# Patient Record
Sex: Male | Born: 1945
Health system: Southern US, Community
[De-identification: ages and names within clinical notes are randomized; demographics above are authoritative.]

## PROBLEM LIST (undated history)

## (undated) DIAGNOSIS — E785 Hyperlipidemia, unspecified: Secondary | ICD-10-CM

## (undated) DIAGNOSIS — I4892 Unspecified atrial flutter: Secondary | ICD-10-CM

## (undated) DIAGNOSIS — I10 Essential (primary) hypertension: Secondary | ICD-10-CM

## (undated) DIAGNOSIS — D751 Secondary polycythemia: Secondary | ICD-10-CM

## (undated) DIAGNOSIS — I639 Cerebral infarction, unspecified: Secondary | ICD-10-CM

## (undated) DIAGNOSIS — Z8619 Personal history of other infectious and parasitic diseases: Secondary | ICD-10-CM

## (undated) DIAGNOSIS — M5430 Sciatica, unspecified side: Secondary | ICD-10-CM

## (undated) DIAGNOSIS — K648 Other hemorrhoids: Secondary | ICD-10-CM

## (undated) DIAGNOSIS — K635 Polyp of colon: Secondary | ICD-10-CM

## (undated) DIAGNOSIS — I499 Cardiac arrhythmia, unspecified: Secondary | ICD-10-CM

## (undated) HISTORY — DX: Polyp of colon: K63.5

## (undated) HISTORY — DX: Hyperlipidemia, unspecified: E78.5

## (undated) HISTORY — DX: Other hemorrhoids: K64.8

## (undated) HISTORY — PX: OTHER SURGICAL HISTORY: SHX169

## (undated) HISTORY — DX: Personal history of other infectious and parasitic diseases: Z86.19

## (undated) HISTORY — DX: Unspecified atrial flutter: I48.92

## (undated) HISTORY — PX: PROSTATE BIOPSY: SHX241

## (undated) HISTORY — DX: Sciatica, unspecified side: M54.30

---

## 1998-06-01 ENCOUNTER — Emergency Department (HOSPITAL_COMMUNITY): Admission: EM | Admit: 1998-06-01 | Discharge: 1998-06-01 | Payer: Self-pay | Admitting: Emergency Medicine

## 2000-11-23 ENCOUNTER — Encounter: Payer: Self-pay | Admitting: Gastroenterology

## 2000-11-23 ENCOUNTER — Encounter: Admission: RE | Admit: 2000-11-23 | Discharge: 2000-11-23 | Payer: Self-pay | Admitting: Gastroenterology

## 2000-12-09 ENCOUNTER — Ambulatory Visit (HOSPITAL_BASED_OUTPATIENT_CLINIC_OR_DEPARTMENT_OTHER): Admission: RE | Admit: 2000-12-09 | Discharge: 2000-12-09 | Payer: Self-pay | Admitting: Gastroenterology

## 2006-07-28 ENCOUNTER — Encounter: Payer: Self-pay | Admitting: Gastroenterology

## 2006-10-22 ENCOUNTER — Emergency Department (HOSPITAL_COMMUNITY): Admission: EM | Admit: 2006-10-22 | Discharge: 2006-10-22 | Payer: Self-pay | Admitting: Emergency Medicine

## 2006-11-22 DIAGNOSIS — I639 Cerebral infarction, unspecified: Secondary | ICD-10-CM

## 2006-11-22 HISTORY — DX: Cerebral infarction, unspecified: I63.9

## 2007-11-22 ENCOUNTER — Inpatient Hospital Stay (HOSPITAL_COMMUNITY): Admission: EM | Admit: 2007-11-22 | Discharge: 2007-11-25 | Payer: Self-pay | Admitting: Emergency Medicine

## 2007-11-22 ENCOUNTER — Ambulatory Visit: Payer: Self-pay | Admitting: Cardiovascular Disease

## 2007-11-24 ENCOUNTER — Encounter (INDEPENDENT_AMBULATORY_CARE_PROVIDER_SITE_OTHER): Payer: Self-pay | Admitting: Neurology

## 2007-11-27 ENCOUNTER — Emergency Department (HOSPITAL_COMMUNITY): Admission: EM | Admit: 2007-11-27 | Discharge: 2007-11-28 | Payer: Self-pay | Admitting: Emergency Medicine

## 2007-11-29 ENCOUNTER — Encounter: Admission: RE | Admit: 2007-11-29 | Discharge: 2007-12-21 | Payer: Self-pay | Admitting: Neurology

## 2008-02-08 ENCOUNTER — Ambulatory Visit (HOSPITAL_BASED_OUTPATIENT_CLINIC_OR_DEPARTMENT_OTHER): Admission: RE | Admit: 2008-02-08 | Discharge: 2008-02-08 | Payer: Self-pay | Admitting: Orthopedic Surgery

## 2009-11-22 HISTORY — PX: COLONOSCOPY: SHX5424

## 2010-09-15 ENCOUNTER — Encounter: Payer: Self-pay | Admitting: Gastroenterology

## 2010-10-09 ENCOUNTER — Telehealth: Payer: Self-pay | Admitting: Gastroenterology

## 2010-10-09 ENCOUNTER — Encounter (INDEPENDENT_AMBULATORY_CARE_PROVIDER_SITE_OTHER): Payer: Self-pay | Admitting: *Deleted

## 2010-10-12 ENCOUNTER — Ambulatory Visit: Payer: Self-pay | Admitting: Gastroenterology

## 2010-10-19 ENCOUNTER — Telehealth (INDEPENDENT_AMBULATORY_CARE_PROVIDER_SITE_OTHER): Payer: Self-pay | Admitting: *Deleted

## 2010-10-26 ENCOUNTER — Ambulatory Visit: Payer: Self-pay | Admitting: Gastroenterology

## 2010-10-26 LAB — HM COLONOSCOPY

## 2010-12-22 NOTE — Procedures (Signed)
Summary: Colon/[facility]  Colon/[facility]   Imported By: [place] 10/26/2010 09:40:17  _____________________________________________________________________  External Attachment:    Type:   Image     Comment:   External Document

## 2010-12-22 NOTE — Progress Notes (Signed)
  Phone Note Other Incoming   Request: Send information Summary of Call: Records received from [facility] (2 Pages)  forwarded to Dr. Sheryn Bison.

## 2010-12-22 NOTE — Letter (Signed)
Summary: Pre Visit Letter Revised  [facility]  [address] [place], [place] [address]   Phone: [phone]  Fax: [phone]        09/15/2010 MRN: [id number]  Patrick Roth [address] [place], [place]  [address]             Procedure Date: 12-5 at 11am           Dr Tomasita Morrow to the Gastroenterology Division at [facility].    You are scheduled to see a nurse for your pre-procedure visit on 10-12-10 at  3:30pm on the 3rd floor at [facility], [address]  We ask that you try to arrive at our office 15 minutes prior to your appointment time to allow for check-in.  Please take a minute to review the attached form.  If you answer "Yes" to one or more of the questions on the first page, we ask that you call the person listed at your earliest opportunity.  If you answer "No" to all of the questions, please complete the rest of the form and bring it to your appointment.    Your nurse visit will consist of discussing your medical and surgical history, your immediate family medical history, and your medications.   If you are unable to list all of your medications on the form, please bring the medication bottles to your appointment and we will list them.  We will need to be aware of both prescribed and over the counter drugs.  We will need to know exact dosage information as well.    Please be prepared to read and sign documents such as consent forms, a financial agreement, and acknowledgement forms.  If necessary, and with your consent, a friend or relative is welcome to sit-in on the nurse visit with you.  Please bring your insurance card so that we may make a copy of it.  If your insurance requires a referral to see a specialist, please bring your referral form from your primary care physician.  No co-pay is required for this nurse visit.     If you cannot keep your appointment, please call [phone] to cancel or reschedule prior to your  appointment date.  This allows [place] the opportunity to schedule an appointment for another patient in need of care.    Thank you for choosing [facility] for your medical needs.  We appreciate the opportunity to care for you.  Please visit [place] at our website  to learn more about our practice.  Sincerely, The Gastroenterology Division

## 2010-12-22 NOTE — Procedures (Signed)
Summary: Colonoscopy  Patient: [name] Note: All result statuses are Final unless otherwise noted.  Tests: (1) Colonoscopy (COL)   COL Colonoscopy           DONE     [facility]     [address]     [place], [place]  [address]           COLONOSCOPY PROCEDURE REPORT           PATIENT:  Patrick Roth, Patrick Roth  MR#:  [id number]     BIRTHDATE:  03-21-1946, 64 yrs. old  GENDER:  male     ENDOSCOPIST:  Vania Rea. Jarold Motto, MD, [facility]     REF. BY:  Talbot Grumbling. Creta Levin, M.D.     PROCEDURE DATE:  10/26/2010     PROCEDURE:  Higher-risk screening colonoscopy G0105           ASA CLASS:  Class II     INDICATIONS:  history of polyps     MEDICATIONS:   Fentanyl 50 mcg IV, Versed 7 mg IV           DESCRIPTION OF PROCEDURE:   After the risks benefits and     alternatives of the procedure were thoroughly explained, informed     consent was obtained.  Digital rectal exam was performed and     revealed tender.   The [id number] endoscope was introduced     through the anus and advanced to the cecum, which was identified     by both the appendix and ileocecal valve, without limitations.     The quality of the prep was good, using MoviPrep.  The instrument     was then slowly withdrawn as the colon was fully examined.     <<PROCEDUREIMAGES>>           FINDINGS:  No polyps or cancers were seen.  Internal hemorrhoids     were found.   Retroflexed views in the rectum revealed     hemorrhoids.  SEE PICTURES  The scope was then withdrawn from the     patient and the procedure completed.           COMPLICATIONS:  None     ENDOSCOPIC IMPRESSION:     1) No polyps or cancers     2) Internal hemorrhoids     RECOMMENDATIONS:     1) high fiber diet     2) Continue current colorectal screening recommendations for     "routine risk" patients with a repeat colonoscopy in 10 years.     REPEAT EXAM:  No           ______________________________     Vania Rea. Jarold Motto, MD, [facility]           CC:         n.     eSIGNED:   Vania Rea. Patterson at 10/26/2010 11:34 AM           Shelby Mattocks, [id number]  Note: An exclamation mark (!) indicates a result that was not dispersed into the flowsheet. Document Creation Date: 10/26/2010 11:35 AM _______________________________________________________________________  (1) Order result status: Final Collection or observation date-time: 10/26/2010 11:29 Requested date-time:  Receipt date-time:  Reported date-time:  Referring Physician:   Ordering Physician: Sheryn Bison [phone]) Specimen Source:  Source: Launa Grill Order Number: [id number] Lab site:   Appended Document: Colonoscopy    Clinical Lists Changes  Observations: Added new observation of COLONNXTDUE: 10/2020 (10/26/2010 14:53)

## 2010-12-22 NOTE — Miscellaneous (Signed)
Summary: [facility] PV  Clinical Lists Changes  Medications: Added new medication of MOVIPREP 100 GM  SOLR (PEG-KCL-NACL-NASULF-NA ASC-C) As per prep instructions. - Signed Rx of MOVIPREP 100 GM  SOLR (PEG-KCL-NACL-NASULF-NA ASC-C) As per prep instructions.;  #1 x 0;  Signed;  Entered by: Ezra Sites RN;  Authorized by: Mardella Layman MD [facility];  Method used: Electronically to [facility]  [phone]*, [address] [place], [place], [place]  [address], Ph: [phone] or [phone], Fax: [phone] Observations: Added new observation of NKA: T (10/12/2010 15:15)    Prescriptions: MOVIPREP 100 GM  SOLR (PEG-KCL-NACL-NASULF-NA ASC-C) As per prep instructions.  #1 x 0   Entered by:   Ezra Sites RN   Authorized by:   Mardella Layman MD [facility]   Signed by:   Ezra Sites RN on 10/12/2010   Method used:   Electronically to        [facility]  [phone]* (retail)       [address]       [place], [place]  [address]       Ph: [phone] or [phone]       Fax: [phone]   RxID:   [id number]   Appended Document: [facility] PV Pt says that he has had previous colonoscopy with Dr. Sherin Quarry. Pt. thinks it was in 2006 and he had polyps-not sure what kind of polyps.  Records signed for release of medical records and given to [facility].

## 2010-12-22 NOTE — Progress Notes (Signed)
Summary: previsit letter ?'s  Phone Note Call from Patient Call back at Home Phone [phone]   Caller: Patient Call For: Dr. Jarold Motto Reason for Call: Talk to Nurse Summary of Call: previsit letter ?'s Initial call taken by: Vallarie Mare,  October 09, 2010 9:07 AM  Follow-up for Phone Call        patient had a stroke 3 years but is on no medications only takes ASA 81mg . advised he is ok to come for previsit Follow-up by: Harlow Mares CMA Duncan Dull),  October 09, 2010 9:27 AM

## 2010-12-22 NOTE — Letter (Signed)
Summary: [facility] Instructions  [facility]  [address] [place], [place] [address]   Phone: [phone]  Fax: [phone]       Patrick Roth    1946-10-09    MRN: [id number]        Procedure Day /Date: Monday 10-26-10     Arrival Time: 10:00 am     Procedure Time: 66:00 am     Location of Procedure:                    _x _  [facility] (4th Floor)   PREPARATION FOR COLONOSCOPY WITH MOVIPREP   Starting 5 days prior to your procedure  10-21-10 do not eat nuts, seeds, popcorn, corn, beans, peas,  salads, or any raw vegetables.  Do not take any fiber supplements (e.g. Metamucil, Citrucel, and Benefiber).  THE DAY BEFORE YOUR PROCEDURE         DATE:  10-25-10   DAY: Sunday  1.  Drink clear liquids the entire day-NO SOLID FOOD  2.  Do not drink anything colored red or purple.  Avoid juices with pulp.  No orange juice.  3.  Drink at least 64 oz. (8 glasses) of fluid/clear liquids during the day to prevent dehydration and help the prep work efficiently.  CLEAR LIQUIDS INCLUDE: Water Jello Ice Popsicles Tea (sugar ok, no milk/cream) Powdered fruit flavored drinks Coffee (sugar ok, no milk/cream) Gatorade Juice: apple, white grape, white cranberry  Lemonade Clear bullion, consomm, broth Carbonated beverages (any kind) Strained chicken noodle soup Hard Candy                             4.  In the morning, mix first dose of MoviPrep solution:    Empty 1 Pouch A and 1 Pouch B into the disposable container    Add lukewarm drinking water to the top line of the container. Mix to dissolve    Refrigerate (mixed solution should be used within 24 hrs)  5.  Begin drinking the prep at 5:00 p.m. The MoviPrep container is divided by 4 marks.   Every 15 minutes drink the solution down to the next mark (approximately 8 oz) until the full liter is complete.   6.  Follow completed prep with 16 oz of clear liquid of your choice (Nothing red or purple).   Continue to drink clear liquids until bedtime.  7.  Before going to bed, mix second dose of MoviPrep solution:    Empty 1 Pouch A and 1 Pouch B into the disposable container    Add lukewarm drinking water to the top line of the container. Mix to dissolve    Refrigerate  THE DAY OF YOUR PROCEDURE      DATE:  10-26-10  DAY:  Monday  Beginning at  6:00 a.m. (5 hours before procedure):         1. Every 15 minutes, drink the solution down to the next mark (approx 8 oz) until the full liter is complete.  2. Follow completed prep with 16 oz. of clear liquid of your choice.    3. You may drink clear liquids until  9:00 a.m. (2 HOURS BEFORE PROCEDURE).   MEDICATION INSTRUCTIONS  Unless otherwise instructed, you should take regular prescription medications with a small sip of water   as early as possible the morning of your procedure.  D Additional medication instructions: Do not HCTZ day of procedure.  OTHER INSTRUCTIONS  You will need a responsible adult at least 66 years of age to accompany you and drive you home.   This person must remain in the waiting room during your procedure.  Wear loose fitting clothing that is easily removed.  Leave jewelry and other valuables at home.  However, you may wish to bring a book to read or  an iPod/MP3 player to listen to music as you wait for your procedure to start.  Remove all body piercing jewelry and leave at home.  Total time from sign-in until discharge is approximately 2-3 hours.  You should go home directly after your procedure and rest.  You can resume normal activities the  day after your procedure.  The day of your procedure you should not:   Drive   Make legal decisions   Operate machinery   Drink alcohol   Return to work  You will receive specific instructions about eating, activities and medications before you leave.    The above instructions have been reviewed and explained to me by   Ezra Sites RN   October 12, 2010 3:48 PM    I fully understand and can verbalize these instructions _____________________________ Date _________

## 2010-12-22 NOTE — Miscellaneous (Signed)
Summary: Analpram  Clinical Lists Changes  Medications: Removed medication of MOVIPREP 100 GM  SOLR (PEG-KCL-NACL-NASULF-NA ASC-C) As per prep instructions. Added new medication of ANALPRAM-HC 1-2.5 % CREA (HYDROCORTISONE ACE-PRAMOXINE) apply to rectum two times a day as needed - Signed Rx of ANALPRAM-HC 1-2.5 % CREA (HYDROCORTISONE ACE-PRAMOXINE) apply to rectum two times a day as needed;  #1 tube x 1;  Signed;  Entered by: Harlow Mares CMA (AAMA);  Authorized by: Mardella Layman MD [facility];  Method used: Electronically to [facility]  [phone]*, [address], [place], [place], [place]  [address], Ph: [phone] or [phone], Fax: [phone]    Prescriptions: ANALPRAM-HC 1-2.5 % CREA (HYDROCORTISONE ACE-PRAMOXINE) apply to rectum two times a day as needed  #1 tube x 1   Entered by:   Harlow Mares CMA (AAMA)   Authorized by:   Mardella Layman MD [facility]   Signed by:   Harlow Mares CMA (AAMA) on 10/26/2010   Method used:   Electronically to        [facility]  [phone]* (retail)       [address]       [place], [place]  [address]       Ph: [phone] or [phone]       Fax: [phone]   RxID:   [id number]

## 2011-04-06 NOTE — Discharge Summary (Signed)
NAME:  Patrick Roth, Patrick Roth NO.:  [id number]   MEDICAL RECORD NO.:  [id number]          PATIENT TYPE:  INP   LOCATION:  3019                         FACILITY:  [facility]   PHYSICIAN:  Melvyn Novas, M.D.  DATE OF BIRTH:  11-12-1946   DATE OF ADMISSION:  11/22/2007  DATE OF DISCHARGE:  11/25/2007                               DISCHARGE SUMMARY   To be discharged from the stroke service.   DATE OF ADMISSION:  November 22, 2007 by Dr. Santina Evans A. Orlin Hilding, M.D.   Followed here in the hospital by Dr. Delia Heady.  The patient arrived  as a code stroke at 72 hours on New Years Eve and on CT scan showed to  have a left thalamic hemorrhage, as well as a small ischemic right-sided  hemorrhage.  A code stroke had been called, and the patient presented  with rather mild right-sided weakness symptoms, of which only a  asymmetry of the eye closure and eye opening is remaining.  There has  been very mild right-sided grip strength decreased and no lower  extremity involvement.  The patient was on hydrochlorothiazide.   Atenolol and aspirin at the time, when he came to the hospital, as well  as enalapril.   The stroke was presumed to be hypertensive in nature, due to location  and hemorrhagic character.  The patient was enrolled in the _________-x  trial and received  Clevidipine, was transferred to the neuro ICU, had  an MRI and had been steadily weaned down Clevidipine, until he was off  on November 24, 2007, able to take his oral medications, and with no need  for physical therapy or rehab.  The patient was discharged home today on  November 24, 2006.   Current tests include in a carotid Doppler which shows a left side of  40% to 60% stenosis, on the right side of less than 40% stenosis.  Bilateral-vertebral flow was noted.  A 2-D echo preliminary shows no  source of embolism.  Thalamic hemorrhage is stable.  There has been no  more blood occurring.  The patient's only abnormality  is an elevated  cholesterol and a hypokalemia between 2.8 and 3.1 during this  hospitalization, which required potassium supplementation.  The patient  was discharged on hydrochlorothiazide 25 mg a day, Statin, Lipitor 20 mg  a day.  Blood pressure controlled with hydrochlorothiazide 25 mg in the  morning, and 20 mEq of potassium supplement should be given.  The  patient can attend an outpatient occupational therapy setting, for his  right hand weakness.      Melvyn Novas, M.D.  Electronically Signed     CD/MEDQ  D:  11/25/2007  T:  11/25/2007  Job:  [id number]   cc:   Tasia Catchings, M.D.  Pramod P. Pearlean Brownie, MD

## 2011-04-06 NOTE — Op Note (Signed)
NAME:  Patrick Roth, Patrick Roth NO.:  [id number]   MEDICAL RECORD NO.:  [id number]          PATIENT TYPE:  AMB   LOCATION:  DSC                          FACILITY:  [facility]   PHYSICIAN:  Cindee Salt, M.D.       DATE OF BIRTH:  06-18-1946   DATE OF PROCEDURE:  02/08/2008  DATE OF DISCHARGE:                               OPERATIVE REPORT   PREOPERATIVE DIAGNOSIS:  Carpal tunnel syndrome right hand.   POSTOPERATIVE DIAGNOSIS:  Carpal tunnel syndrome right hand.   OPERATION:  Decompression right median nerve.   SURGEON:  Cindee Salt, M.D.   ASSISTANT:  Carolyne Fiscal R.N.   ANESTHESIA:  Forearm based IV regional.   ANESTHESIOLOGIST:  Maren Beach, M.D.   HISTORY:  The patient is a 65 year old male referred by Dr. Sherin Quarry for  consultation with respect to carpal tunnel syndrome and his physical  examination is consistent with this.  EMG nerve conductions were  positive.  Conservative treatment has not afforded him real relief of  symptoms.  He has elected to undergo surgical decompression.  The pre-  and-postoperative course have been discussed, along with risks and  complications.  He is aware that there is no guarantee with the surgery;  possibility of infection; recurrence; injury to arteries, nerves,  tendons; complete relief of symptoms; dystrophy.  Preoperative area the  patient is seen.  Questions have been encouraged and answered to his  satisfaction.  The extremity marked by both the patient and surgeon.  Antibiotic given.   PROCEDURE:  The patient was brought to the operating room where a  forearm based IV regional anesthetic was carried out without difficulty.  He was prepped using DuraPrep, supine position.  After a time-out a  longitudinal incision was made in the palm and carried down through  subcutaneous tissue.  Bleeders were electrocauterized.  Palmar fascia  was split.  Superficial palmar arch identified.  The flexor tendon to  the ring and little finger  identified, to the ulnar side of median  nerve.  Carpal retinaculum was incised with sharp dissection.   A right-angle and Sewall retractor were placed between skin and forearm  fascia.  The fascia released for approximately 1.5 cm proximal to the  wrist crease under direct vision.  Canal was explored.  Air compression  of the nerve was apparent.  Significant swelling of the tenosynovial  tissue and fluid reduction was noted in the tenosynovium of the flexor  tendons.  The wound was irrigated.  The skin closed interrupted with  interrupted 5-0  Vicryl Rapide sutures.  Sterile compressive dressing and wrist splint  was applied.  The patient tolerated the procedure well and was taken to  the recovery room for observation in satisfactory condition.   He will be discharged home to return to the [facility] [place] in  1 week on Vicodin.           ______________________________  Cindee Salt, M.D.     GK/MEDQ  D:  02/08/2008  T:  02/08/2008  Job:  [id number]   cc:   Tasia Catchings,  M.D. 

## 2011-04-06 NOTE — H&P (Signed)
NAME:  Patrick Roth, Patrick Roth NO.:  [id number]   MEDICAL RECORD NO.:  [id number]          PATIENT TYPE:  INP   LOCATION:  1833                         FACILITY:  [facility]   PHYSICIAN:  Gustavus Messing. Orlin Hilding, M.D.DATE OF BIRTH:  24-Dec-1945   DATE OF ADMISSION:  11/22/2007  DATE OF DISCHARGE:                              HISTORY & PHYSICAL   REASON FOR CONSULTATION:  Code stroke called at 24.  CT was known to  me at 1645.  Initial exam was 1640.   CHIEF COMPLAINT:  Right-sided weakness.   HISTORY OF PRESENT ILLNESS:  Patrick Roth is a 64 year old African American  man who was in his usual state of health.  He was outside spraying  shellac on a piece of furniture when he developed a right hemiparesis.  His wife called 9-1-1 and the patient was brought in by ambulance.  The  CT in the emergency room showed that he had a left thalamic hemorrhage  and code stroke was called.   REVIEW OF SYSTEMS:  Negative for any headache.  He really has no  complaints other than the right-sided weakness.  No vision changes that  he is aware of.  He has no complaints of cardiac, pulmonary, GI, GU,  musculoskeletal, endocrine, reproductive, hematologic, lymphatic,  allergic, immunologic or a psychiatric nature.   PAST MEDICAL HISTORY:  Significant for a remote carpal tunnel surgery  and hypertension.  There is no history of diabetes.  No history of renal  disease.  No history of liver disease and he has never had a stroke.  Stroke risk factors do include hypertension.   MEDICATIONS:  1. Hydrochlorothiazide 12.5 mg once in the morning.  2. Atenolol 100 mg once in the morning.  3. He was supposed to be taking enalapril 20 mg a day but he has not      been taking that.  4. Aspirin 81 mg at night.  He did take two aspirin immediately after      his symptoms developed thinking that he might be having a stroke.   ALLERGIES:  NO KNOWN DRUG ALLERGIES.   SOCIAL HISTORY:  He is married.  He does  Environmental education officer.  He has  children.  He does not smoke.  He does not use alcohol or illicit drugs   FAMILY HISTORY:  Positive for coronary artery disease and hypertension.   OBJECTIVE:  VITAL SIGNS: Temperature is 97.5, blood pressure 185/115,  heart rate 60, respirations 20, O2 saturation is 99%.  HEAD:  Normocephalic atraumatic.  NECK:  Supple without bruits.  LUNGS: Clear to auscultation.  HEART: Regular rate and rhythm.  ABDOMEN:  Nontender and soft which is benign.  EXTREMITIES: Without edema.  He has some mild gynecomastia.  SKIN:  Dry.  Mucosa moist.  NEUROLOGIC:  Awake and alert.  He is oriented x2 to age and date.  He  follows commands 2/2.  He does have some apraxia when observed doing  other things.  Extraocular movements are intact with full gaze.  Visual  fields; he does extinguish in the right superior quadrant to double  simultaneous stimulation.  Otherwise does not have any field deficits.  There is no facial droop.  He does not have any drift in the left upper  or lower extremities but he has a level I drift in the right upper and  lower extremities.  No ataxia on finger-to-nose or heel-to-shin.  He has  decreased sensation on the right arm, face and leg.  He has no  dysarthria.  He has mild aphasia.  He does have extinction both the  visual and sensory which turns him two points on the NIH stroke scale.  He has a score of seven on the NIH stroke scale for a partial  hemianopsia, level I drift in the right upper extremity,  level I drift  in the right lower extremity, partial sensory loss on the right, mild  aphasia and two points for the extinction of two modalities.  Modified  Rankin score is a zero.   LABORATORY DATA:  INR is 1.0.  White blood cell count 3.9, hemoglobin  15.4, hematocrit 45.4, platelets 145.  His BMET is pending at this time.  CT of the head again shows a left thalamic hemorrhage about 2 cm  diameter, roughly a 4.5 cc volume.   IMPRESSION:   1. Left thalamic hemorrhage likely secondary to hypertension with a      right hemiparesis, mild aphasia and some neglect.  2. Hypertension, accelerated.   PLAN:  Will admit to the ICU for management.  He is an ACCELERATE  candidate and will be enrolled in the Clevidipine trial.  He will get an  MRI at some point and further oral management of his antihypertensives  following this study.      Catherine A. Orlin Hilding, M.D.  Electronically Signed     CAW/MEDQ  D:  11/22/2007  T:  11/22/2007  Job:  [id number]   cc:   Tasia Catchings, M.D.

## 2011-08-12 LAB — COMPREHENSIVE METABOLIC PANEL
ALT: 18
AST: 21
Albumin: 3.2 — ABNORMAL LOW
Albumin: 3.3 — ABNORMAL LOW
Alkaline Phosphatase: 54
BUN: 18
Calcium: 8.4
Calcium: 8.4
Chloride: 104
Creatinine, Ser: 1.27
GFR calc Af Amer: >60
GFR calc non Af Amer: 57 — ABNORMAL LOW
GFR calc non Af Amer: >60
GFR calc non Af Amer: >60
Glucose, Bld: 101 — ABNORMAL HIGH
Glucose, Bld: 93
Glucose, Bld: 97
Potassium: 3.1 — ABNORMAL LOW
Potassium: 3.5
Sodium: 143
Total Bilirubin: 0.9
Total Bilirubin: 1.2
Total Protein: 6.5
Total Protein: 6.6

## 2011-08-12 LAB — I-STAT 8, (EC8 V) (CONVERTED LAB)
Chloride: 103
HCT: 52
Hemoglobin: 17.7 — ABNORMAL HIGH
pH, Ven: 7.428 — ABNORMAL HIGH

## 2011-08-12 LAB — LIPASE, BLOOD
Lipase: 21
Lipase: 22

## 2011-08-12 LAB — TRIGLYCERIDES: Triglycerides: 76

## 2011-08-12 LAB — PROTHROMBIN GENE MUTATION

## 2011-08-12 LAB — LIPID PANEL: Cholesterol: 220 — ABNORMAL HIGH

## 2011-08-12 LAB — HOMOCYSTEINE: Homocysteine: 10.4

## 2011-08-12 LAB — POCT I-STAT CREATININE: Operator id: 294341

## 2011-08-16 LAB — POCT HEMOGLOBIN-HEMACUE: Hemoglobin: 15.7

## 2011-08-16 LAB — POCT I-STAT, CHEM 8
Creatinine, Ser: 1.3
Hemoglobin: 16.3

## 2011-08-27 LAB — CK TOTAL AND CKMB (NOT AT ARMC)
CK, MB: 3.4
Total CK: 374 — ABNORMAL HIGH

## 2011-08-27 LAB — COMPREHENSIVE METABOLIC PANEL
ALT: UNDETERMINED
BUN: 10
BUN: 13
CO2: 28
CO2: 29
Calcium: 9.1
Chloride: 106
Creatinine, Ser: 0.96
Creatinine, Ser: 0.97
GFR calc Af Amer: >60
GFR calc non Af Amer: >60
Glucose, Bld: 107 — ABNORMAL HIGH
Total Bilirubin: 1.1
Total Bilirubin: UNDETERMINED

## 2011-08-27 LAB — PROTIME-INR
INR: 1
Prothrombin Time: 13.7
Prothrombin Time: 14.1

## 2011-08-27 LAB — DIFFERENTIAL
Eosinophils Absolute: 0.1
Lymphocytes Relative: 21
Lymphs Abs: 0.8
Monocytes Relative: 11
Neutro Abs: 2.6
Neutrophils Relative %: 65

## 2011-08-27 LAB — TROPONIN I: Troponin I: 0.06

## 2011-08-27 LAB — CBC
HCT: 45.4
Hemoglobin: 15.4
MCHC: 34
RDW: 13.6

## 2011-08-27 LAB — POCT CARDIAC MARKERS
Myoglobin, poc: 96
Operator id: 288831

## 2011-08-27 LAB — BILIRUBIN, DIRECT: Bilirubin, Direct: 0.3

## 2011-08-27 LAB — APTT: aPTT: 28

## 2011-08-27 LAB — TRIGLYCERIDES: Triglycerides: 68

## 2011-11-18 ENCOUNTER — Emergency Department (HOSPITAL_COMMUNITY)
Admission: EM | Admit: 2011-11-18 | Discharge: 2011-11-19 | Disposition: A | Discharge status: Home / Self Care | Payer: Medicare Other | Attending: Emergency Medicine | Admitting: Emergency Medicine

## 2011-11-18 ENCOUNTER — Encounter: Payer: Self-pay | Admitting: Emergency Medicine

## 2011-11-18 DIAGNOSIS — Z8673 Personal history of transient ischemic attack (TIA), and cerebral infarction without residual deficits: Secondary | ICD-10-CM | POA: Insufficient documentation

## 2011-11-18 DIAGNOSIS — R319 Hematuria, unspecified: Secondary | ICD-10-CM | POA: Insufficient documentation

## 2011-11-18 DIAGNOSIS — R350 Frequency of micturition: Secondary | ICD-10-CM | POA: Insufficient documentation

## 2011-11-18 DIAGNOSIS — I10 Essential (primary) hypertension: Secondary | ICD-10-CM | POA: Insufficient documentation

## 2011-11-18 DIAGNOSIS — R300 Dysuria: Secondary | ICD-10-CM

## 2011-11-18 HISTORY — DX: Cerebral infarction, unspecified: I63.9

## 2011-11-18 HISTORY — DX: Essential (primary) hypertension: I10

## 2011-11-18 LAB — URINALYSIS, ROUTINE W REFLEX MICROSCOPIC
Bilirubin Urine: NEGATIVE
Glucose, UA: NEGATIVE mg/dL
Protein, ur: NEGATIVE mg/dL
Urobilinogen, UA: 0.2 mg/dL (ref 0.0–1.0)

## 2011-11-18 LAB — URINE MICROSCOPIC-ADD ON

## 2011-11-18 MED ORDER — OXYCODONE-ACETAMINOPHEN 5-325 MG PO TABS
1.0000 | ORAL_TABLET | Freq: Once | ORAL | Status: DC
  Filled 2011-11-18: qty 1

## 2011-11-18 NOTE — ED Notes (Addendum)
Pt is having frequency and difficulty urinating and states that he felt like he had the flu last week eventhough he had no N/V/D or fever. States he has a cough sometimes. States he also was having trouble getting up and loosing his balance recently as well.

## 2011-11-18 NOTE — ED Provider Notes (Signed)
History     CSN: [id number]  Arrival date & time 11/18/11  4098   First MD Initiated Contact with Patient 11/18/11 2204      Chief Complaint  Patient presents with  . Urinary Frequency   HPI: Patient is a 65 y.o. male presenting with frequency. The history is provided by the patient.  Urinary Frequency This is a chronic problem. The current episode started in the past 7 days. The problem has been gradually worsening.  Patient reports increased frequency of urination and and dysuria since Sunday. Admits that he has a chronic history of urinary frequency but states the frequency has increased dramatically since Sunday. States that he saw his primary care physician Monday and was placed on an antibiotic which he had just started yesterday evening. States he has had 2 doses of Septra. States he also has a very small amount of urine with each void but also states this is not new. Since Sunday the patient states he physically has to push his urine flow which causes increased pain in his suprapubic area. Also states he's had low back pain since onset of symptoms. Denies history of enlarged prostate.   Past Medical History  Diagnosis Date  . Hypertension   . Stroke 2008    History reviewed. No pertinent past surgical history.  History reviewed. No pertinent family history.  History  Substance Use Topics  . Smoking status: Never Smoker   . Smokeless tobacco: Not on file  . Alcohol Use: No      Review of Systems  Constitutional: Negative.   HENT: Negative.   Eyes: Negative.   Respiratory: Negative.   Cardiovascular: Negative.   Gastrointestinal: Negative.   Genitourinary: Positive for frequency.  Musculoskeletal: Negative.   Skin: Negative.   Neurological: Negative.   Hematological: Negative.   Psychiatric/Behavioral: Negative.     Allergies  Review of patient's allergies indicates no known allergies.  Home Medications   Current Outpatient Rx  Name Route Sig Dispense  Refill  . AMLODIPINE BESYLATE 5 MG PO TABS Oral Take 5 mg by mouth daily.      . ATENOLOL 100 MG PO TABS Oral Take 100 mg by mouth daily.      Marland Kitchen LOSARTAN POTASSIUM 100 MG PO TABS Oral Take 100 mg by mouth daily.        BP 152/90  Pulse 61  Temp(Src) 98.8 F (37.1 C) (Oral)  SpO2 98%  Physical Exam  Constitutional: He appears well-developed and well-nourished.  HENT:  Head: Normocephalic and atraumatic.  Eyes: Conjunctivae are normal.  Neck: Neck supple.  Cardiovascular: Normal rate and regular rhythm.   Pulmonary/Chest: Effort normal and breath sounds normal.  Abdominal: Soft. Bowel sounds are normal.  Genitourinary: Penis normal.       No gross prostate enlargement.  Musculoskeletal: Normal range of motion.  Neurological: He is alert.  Skin: Skin is warm and dry.  Psychiatric: He has a normal mood and affect.    ED Course  Procedures discussed findings and impression the patient. Will plan to discharge home with medication for pain and place patient on a short course of Flomax. Patient has been instructed to call his urologist tomorrow to arrange followup as soon as can be arranged. Patient agreeable with plan. I have also discussed this patient with Dr. Radford Pax who is agreeable with plan as well.  Labs Reviewed  URINALYSIS, ROUTINE W REFLEX MICROSCOPIC - Abnormal; Notable for the following:    APPearance CLOUDY (*)  Hgb urine dipstick SMALL (*)    All other components within normal limits  URINE MICROSCOPIC-ADD ON   No results found.   No diagnosis found.    MDM  Dysuria, increased urinary frequency, suprapubic pain and low back pain, cause unclear. No urinary tract infection, hematuria noted       Leanne Chang, NP 11/19/11 0024  Roma Kayser Shylo Zamor, NP 11/19/11 1610

## 2011-11-19 MED ORDER — HYDROCODONE-ACETAMINOPHEN 5-325 MG PO TABS: 1.0000 | ORAL_TABLET | ORAL | Status: DC | PRN

## 2011-11-19 MED ORDER — TAMSULOSIN HCL 0.4 MG PO CAPS: 0.4000 mg | ORAL_CAPSULE | Freq: Every day | ORAL | Status: DC

## 2011-11-19 NOTE — ED Provider Notes (Signed)
Medical screening examination/treatment/procedure(s) were performed by non-physician practitioner and as supervising physician I was immediately available for consultation/collaboration.    Kobee Medlen L Bela Nyborg, MD 11/19/11 1046 

## 2011-11-23 ENCOUNTER — Encounter (HOSPITAL_COMMUNITY): Payer: Self-pay | Admitting: *Deleted

## 2011-11-23 ENCOUNTER — Emergency Department (HOSPITAL_COMMUNITY)
Admission: EM | Admit: 2011-11-23 | Discharge: 2011-11-24 | Disposition: A | Discharge status: Home / Self Care | Payer: Medicare Other | Attending: Emergency Medicine | Admitting: Emergency Medicine

## 2011-11-23 DIAGNOSIS — R3911 Hesitancy of micturition: Secondary | ICD-10-CM | POA: Insufficient documentation

## 2011-11-23 DIAGNOSIS — R339 Retention of urine, unspecified: Secondary | ICD-10-CM | POA: Insufficient documentation

## 2011-11-23 DIAGNOSIS — R7401 Elevation of levels of liver transaminase levels: Secondary | ICD-10-CM | POA: Insufficient documentation

## 2011-11-23 DIAGNOSIS — R109 Unspecified abdominal pain: Secondary | ICD-10-CM | POA: Insufficient documentation

## 2011-11-23 DIAGNOSIS — R7402 Elevation of levels of lactic acid dehydrogenase (LDH): Secondary | ICD-10-CM | POA: Insufficient documentation

## 2011-11-23 DIAGNOSIS — R05 Cough: Secondary | ICD-10-CM | POA: Insufficient documentation

## 2011-11-23 DIAGNOSIS — R110 Nausea: Secondary | ICD-10-CM | POA: Insufficient documentation

## 2011-11-23 DIAGNOSIS — R350 Frequency of micturition: Secondary | ICD-10-CM | POA: Insufficient documentation

## 2011-11-23 DIAGNOSIS — R059 Cough, unspecified: Secondary | ICD-10-CM | POA: Insufficient documentation

## 2011-11-23 DIAGNOSIS — Z8679 Personal history of other diseases of the circulatory system: Secondary | ICD-10-CM | POA: Insufficient documentation

## 2011-11-23 DIAGNOSIS — I10 Essential (primary) hypertension: Secondary | ICD-10-CM | POA: Insufficient documentation

## 2011-11-23 DIAGNOSIS — R3915 Urgency of urination: Secondary | ICD-10-CM | POA: Insufficient documentation

## 2011-11-23 LAB — URINALYSIS, ROUTINE W REFLEX MICROSCOPIC
Glucose, UA: NEGATIVE mg/dL
Leukocytes, UA: NEGATIVE
Protein, ur: NEGATIVE mg/dL
Specific Gravity, Urine: 1.008 (ref 1.005–1.030)
Urobilinogen, UA: 0.2 mg/dL (ref 0.0–1.0)

## 2011-11-23 LAB — URINE MICROSCOPIC-ADD ON

## 2011-11-23 NOTE — ED Notes (Signed)
The pt has had abd pain with some urinary hesitation for 10 days.  He has been seen at urgent care and a doctors  Office and diagnosed with pneumonia

## 2011-11-24 LAB — DIFFERENTIAL
Basophils Absolute: 0 10*3/uL (ref 0.0–0.1)
Basophils Relative: 1 % (ref 0–1)
Eosinophils Absolute: 0.3 10*3/uL (ref 0.0–0.7)
Eosinophils Relative: 6 % — ABNORMAL HIGH (ref 0–5)
Neutrophils Relative %: 55 % (ref 43–77)

## 2011-11-24 LAB — COMPREHENSIVE METABOLIC PANEL
ALT: 116 U/L — ABNORMAL HIGH (ref 0–53)
AST: 228 U/L — ABNORMAL HIGH (ref 0–37)
Albumin: 3.5 g/dL (ref 3.5–5.2)
Calcium: 9.5 mg/dL (ref 8.4–10.5)
GFR calc Af Amer: 72 mL/min — ABNORMAL LOW (ref >90)
Glucose, Bld: 103 mg/dL — ABNORMAL HIGH (ref 70–99)
Potassium: 3.3 mEq/L — ABNORMAL LOW (ref 3.5–5.1)
Sodium: 140 mEq/L (ref 135–145)
Total Protein: 7.8 g/dL (ref 6.0–8.3)

## 2011-11-24 LAB — CBC
MCH: 29.3 pg (ref 26.0–34.0)
MCV: 85.9 fL (ref 78.0–100.0)
Platelets: 227 10*3/uL (ref 150–400)
RDW: 13.6 % (ref 11.5–15.5)

## 2011-11-24 NOTE — ED Notes (Signed)
Leg bag placed on foley catherter and new drainage large bag sent home with patient for night time usage. Instruction given to wife and patient on how to empty and change drainage bags. Demonstration also given. They verbalize understanding.

## 2011-11-24 NOTE — ED Notes (Signed)
Reports onset of lower abd pain and urinary sx aobut 10 days ago. Denies n/v or blood in urine, no odor.  Also reports bil lower extremity swelling during the same time period. bil lower ext swelling noted trace to 1+. Right slightly more than left. Denies any SOB. Bil breath sounds clear.

## 2011-11-24 NOTE — ED Provider Notes (Signed)
History     CSN: [id number]  Arrival date & time 11/23/11  2130   First MD Initiated Contact with Patient 11/24/11 [phone]      Chief Complaint  Patient presents with  . Urinary Tract Infection    (Consider location/radiation/quality/duration/timing/severity/associated sxs/prior treatment) HPI Comments: Patient with chief complaint of suprapubic pain and in urinary hesitancy the past week. Patient states having a cough and nausea approximately 10 days ago. Patient was seen at [facility] with complaint of hesitancy and discharge. Patient then saw his primary care physician in [facility], states he had a CT scan performed demonstrating prostate enlargement and pneumonia. He was started on medication for pneumonia. Patient has upcoming followup with urologist. His complaint tonight is suprapubic pain and constantly getting up to urinate. He states he is unable to sleep because of his urinary frequency. Patient denies fever, vomiting, diarrhea. Patient denies alcohol use.  Patient is a 67 y.o. male presenting with abdominal pain. The history is provided by the patient. The history is limited by the condition of the patient.  Abdominal Pain The primary symptoms of the illness include nausea. The primary symptoms of the illness do not include abdominal pain, fever, shortness of breath, vomiting, diarrhea or dysuria.  Additional symptoms associated with the illness include urgency and frequency. Symptoms associated with the illness do not include chills, constipation or hematuria.    Past Medical History  Diagnosis Date  . Hypertension   . Stroke 2008    History reviewed. No pertinent past surgical history.  History reviewed. No pertinent family history.  History  Substance Use Topics  . Smoking status: Never Smoker   . Smokeless tobacco: Not on file  . Alcohol Use: No      Review of Systems  Constitutional: Negative for fever and chills.  HENT: Negative for sore throat and  rhinorrhea.   Eyes: Negative for redness.  Respiratory: Negative for shortness of breath.   Cardiovascular: Negative for chest pain.  Gastrointestinal: Positive for nausea. Negative for vomiting, abdominal pain, diarrhea and constipation.       Positive for suprapubic tenderness.  Genitourinary: Positive for urgency and frequency. Negative for dysuria and hematuria.       Positive for urinary hesitancy.  Musculoskeletal: Negative for myalgias.  Skin: Negative for rash.  Neurological: Negative for dizziness and headaches.    Allergies  Review of patient's allergies indicates no known allergies.  Home Medications   Current Outpatient Rx  Name Route Sig Dispense Refill  . AMLODIPINE BESYLATE 5 MG PO TABS Oral Take 5 mg by mouth daily.      . ATENOLOL 100 MG PO TABS Oral Take 100 mg by mouth daily.      Marland Kitchen HYDROCODONE-ACETAMINOPHEN 5-325 MG PO TABS Oral Take 1 tablet by mouth every 4 (four) hours as needed. 1 to 2 tabs q4-5h PRN pain     . LEVOFLOXACIN 500 MG PO TABS Oral Take 500 mg by mouth daily.      Marland Kitchen LOSARTAN POTASSIUM 100 MG PO TABS Oral Take 100 mg by mouth daily.      . SULFAMETHOXAZOLE-TMP DS 800-160 MG PO TABS Oral Take 1 tablet by mouth 2 (two) times daily.        BP 175/106  Pulse 82  Temp(Src) 99.2 F (37.3 C) (Oral)  Resp 18  SpO2 99%  Physical Exam  Nursing note and vitals reviewed. Constitutional: He is oriented to person, place, and time. He appears well-developed and well-nourished.  HENT:  Head: Normocephalic and atraumatic.  Eyes: Conjunctivae are normal. Pupils are equal, round, and reactive to light. Right eye exhibits no discharge. Left eye exhibits no discharge.  Neck: Normal range of motion. Neck supple.  Cardiovascular: Normal rate, regular rhythm and normal heart sounds.   Pulmonary/Chest: Effort normal and breath sounds normal. No respiratory distress. He has no wheezes.  Abdominal: Soft. Bowel sounds are normal. There is tenderness in the  suprapubic area. There is no rebound and no guarding.       Bladder palpable. Patient with mild suprapubic tenderness.  Musculoskeletal: He exhibits no edema.  Neurological: He is alert and oriented to person, place, and time.  Skin: Skin is warm and dry.  Psychiatric: He has a normal mood and affect.    ED Course  Procedures (including critical care time)  Labs Reviewed  URINALYSIS, ROUTINE W REFLEX MICROSCOPIC - Abnormal; Notable for the following:    Hgb urine dipstick MODERATE (*)    All other components within normal limits  DIFFERENTIAL - Abnormal; Notable for the following:    Monocytes Relative 18 (*)    Monocytes Absolute 1.1 (*)    Eosinophils Relative 6 (*)    All other components within normal limits  COMPREHENSIVE METABOLIC PANEL - Abnormal; Notable for the following:    Potassium 3.3 (*)    Glucose, Bld 103 (*)    AST 228 (*)    ALT 116 (*)    GFR calc non Af Amer 63 (*)    GFR calc Af Amer 72 (*)    All other components within normal limits  URINE MICROSCOPIC-ADD ON  CBC   No results found.   1. Urinary retention   2. Transaminitis    4:08 AM patient was seen and examined. Discussed with and seen by Dr. Dierdre Highman. Foley placed and patient had drainage of approximately 1200 cc of urine. Patient was feeling better after Foley was placed and Foley was left in place. Patient informed of transaminitis. Informed that he will need to follow up with his primary care physician regarding this. Patient states that his primary care physician is putting them in touch with the urologist. Urology referral in [place] given to patient tonight. Urged patient to return if worsening symptoms. He verbalizes understanding and agrees with the plan.  Patient was informed of high blood pressure reading today.  Patient was counseled about long-term health problems hypertension can cause and was urged to follow-up with a primary care doctor in the next week for a blood pressure recheck.   Patient verbalized understanding.    MDM  Patient with primary problem of urinary retention, improved with placement of Foley catheter. Given recent testing, retention is likely secondary to enlarged prostate. Patient also has transaminitis of unknown cause. Patient is not having upper abdominal pain so biliary etiology is not suspected. Patient is not on any medications which are suspicious for increase in liver enzymes. Do not suspect patient's current problems are related to his liver. Patient can safely be discharged home and have this followed by his primary care physician. Patient appears well and is stable for discharge to home.     Eustace Moore [place], [place] 11/24/11 0411   Medical screening examination/treatment/procedure(s) were conducted as a shared visit with non-physician practitioner(s) and myself.  I personally evaluated the patient during the encounter. PT with 400cc UOP initially after foley and almost 800cc after 10 minutes. Much improved after foley. No RUQ tenderness, fever or clinical cholecystitis. Plan Urology and PCP  follow up.   Sunnie Nielsen, MD 11/24/11 [phone]

## 2012-01-18 ENCOUNTER — Other Ambulatory Visit: Payer: Self-pay | Admitting: Family Medicine

## 2012-01-18 DIAGNOSIS — I10 Essential (primary) hypertension: Secondary | ICD-10-CM

## 2012-01-19 ENCOUNTER — Encounter (HOSPITAL_BASED_OUTPATIENT_CLINIC_OR_DEPARTMENT_OTHER): Payer: Self-pay | Admitting: Family Medicine

## 2012-01-19 ENCOUNTER — Emergency Department (HOSPITAL_BASED_OUTPATIENT_CLINIC_OR_DEPARTMENT_OTHER)
Admission: EM | Admit: 2012-01-19 | Discharge: 2012-01-19 | Disposition: A | Discharge status: Home / Self Care | Payer: Medicare Other | Attending: Emergency Medicine | Admitting: Emergency Medicine

## 2012-01-19 ENCOUNTER — Other Ambulatory Visit: Payer: Self-pay

## 2012-01-19 DIAGNOSIS — Z8679 Personal history of other diseases of the circulatory system: Secondary | ICD-10-CM | POA: Insufficient documentation

## 2012-01-19 DIAGNOSIS — I10 Essential (primary) hypertension: Secondary | ICD-10-CM

## 2012-01-19 DIAGNOSIS — Z79899 Other long term (current) drug therapy: Secondary | ICD-10-CM | POA: Insufficient documentation

## 2012-01-19 NOTE — Discharge Instructions (Signed)
YOU CAN TAKE ONE EXTRA HYDRALAZINE TONIGHT THEN GO BACK TO YOUR REGULAR DOSING TOMORROW  Arterial Hypertension Arterial hypertension (high blood pressure) is a condition of elevated pressure in your blood vessels. Hypertension over a long period of time is a risk factor for strokes, heart attacks, and heart failure. It is also the leading cause of kidney (renal) failure.  CAUSES   In Adults -- Over 90% of all hypertension has no known cause. This is called essential or primary hypertension. In the other 10% of people with hypertension, the increase in blood pressure is caused by another disorder. This is called secondary hypertension. Important causes of secondary hypertension are:   Heavy alcohol use.   Obstructive sleep apnea.   Hyperaldosterosim (Conn's syndrome).   Steroid use.   Chronic kidney failure.   Hyperparathyroidism.   Medications.   Renal artery stenosis.   Pheochromocytoma.   Cushing's disease.   Coarctation of the aorta.   Scleroderma renal crisis.   Licorice (in excessive amounts).   Drugs (cocaine, methamphetamine).  Your caregiver can explain any items above that apply to you.  In Children -- Secondary hypertension is more common and should always be considered.   Pregnancy -- Few women of childbearing age have high blood pressure. However, up to 10% of them develop hypertension of pregnancy. Generally, this will not harm the woman. It may be a sign of 3 complications of pregnancy: preeclampsia, HELLP syndrome, and eclampsia. Follow up and control with medication is necessary.  SYMPTOMS   This condition normally does not produce any noticeable symptoms. It is usually found during a routine exam.   Malignant hypertension is a late problem of high blood pressure. It may have the following symptoms:   Headaches.   Blurred vision.   End-organ damage (this means your kidneys, heart, lungs, and other organs are being damaged).   Stressful situations  can increase the blood pressure. If a person with normal blood pressure has their blood pressure go up while being seen by their caregiver, this is often termed "white coat hypertension." Its importance is not known. It may be related with eventually developing hypertension or complications of hypertension.   Hypertension is often confused with mental tension, stress, and anxiety.  DIAGNOSIS  The diagnosis is made by 3 separate blood pressure measurements. They are taken at least 1 week apart from each other. If there is organ damage from hypertension, the diagnosis may be made without repeat measurements. Hypertension is usually identified by having blood pressure readings:  Above 140/90 mmHg measured in both arms, at 3 separate times, over a couple weeks.   Over 130/80 mmHg should be considered a risk factor and may require treatment in patients with diabetes.  Blood pressure readings over 120/80 mmHg are called "pre-hypertension" even in non-diabetic patients. To get a true blood pressure measurement, use the following guidelines. Be aware of the factors that can alter blood pressure readings.  Take measurements at least 1 hour after caffeine.   Take measurements 30 minutes after smoking and without any stress. This is another reason to quit smoking - it raises your blood pressure.   Use a proper cuff size. Ask your caregiver if you are not sure about your cuff size.   Most home blood pressure cuffs are automatic. They will measure systolic and diastolic pressures. The systolic pressure is the pressure reading at the start of sounds. Diastolic pressure is the pressure at which the sounds disappear. If you are elderly, measure pressures in  multiple postures. Try sitting, lying or standing.   Sit at rest for a minimum of 5 minutes before taking measurements.   You should not be on any medications like decongestants. These are found in many cold medications.   Record your blood pressure  readings and review them with your caregiver.  If you have hypertension:  Your caregiver may do tests to be sure you do not have secondary hypertension (see "causes" above).   Your caregiver may also look for signs of metabolic syndrome. This is also called Syndrome X or Insulin Resistance Syndrome. You may have this syndrome if you have type 2 diabetes, abdominal obesity, and abnormal blood lipids in addition to hypertension.   Your caregiver will take your medical and family history and perform a physical exam.   Diagnostic tests may include blood tests (for glucose, cholesterol, potassium, and kidney function), a urinalysis, or an EKG. Other tests may also be necessary depending on your condition.  PREVENTION  There are important lifestyle issues that you can adopt to reduce your chance of developing hypertension:  Maintain a normal weight.   Limit the amount of salt (sodium) in your diet.   Exercise often.   Limit alcohol intake.   Get enough potassium in your diet. Discuss specific advice with your caregiver.   Follow a DASH diet (dietary approaches to stop hypertension). This diet is rich in fruits, vegetables, and low-fat dairy products, and avoids certain fats.  PROGNOSIS  Essential hypertension cannot be cured. Lifestyle changes and medical treatment can lower blood pressure and reduce complications. The prognosis of secondary hypertension depends on the underlying cause. Many people whose hypertension is controlled with medicine or lifestyle changes can live a normal, healthy life.  RISKS AND COMPLICATIONS  While high blood pressure alone is not an illness, it often requires treatment due to its short- and long-term effects on many organs. Hypertension increases your risk for:  CVAs or strokes (cerebrovascular accident).   Heart failure due to chronically high blood pressure (hypertensive cardiomyopathy).   Heart attack (myocardial infarction).   Damage to the retina  (hypertensive retinopathy).   Kidney failure (hypertensive nephropathy).  Your caregiver can explain list items above that apply to you. Treatment of hypertension can significantly reduce the risk of complications. TREATMENT   For overweight patients, weight loss and regular exercise are recommended. Physical fitness lowers blood pressure.   Mild hypertension is usually treated with diet and exercise. A diet rich in fruits and vegetables, fat-free dairy products, and foods low in fat and salt (sodium) can help lower blood pressure. Decreasing salt intake decreases blood pressure in a 1/3 of people.   Stop smoking if you are a smoker.  The steps above are highly effective in reducing blood pressure. While these actions are easy to suggest, they are difficult to achieve. Most patients with moderate or severe hypertension end up requiring medications to bring their blood pressure down to a normal level. There are several classes of medications for treatment. Blood pressure pills (antihypertensives) will lower blood pressure by their different actions. Lowering the blood pressure by 10 mmHg may decrease the risk of complications by as much as 25%. The goal of treatment is effective blood pressure control. This will reduce your risk for complications. Your caregiver will help you determine the best treatment for you according to your lifestyle. What is excellent treatment for one person, may not be for you. HOME CARE INSTRUCTIONS   Do not smoke.   Follow the lifestyle  changes outlined in the "Prevention" section.   If you are on medications, follow the directions carefully. Blood pressure medications must be taken as prescribed. Skipping doses reduces their benefit. It also puts you at risk for problems.   Follow up with your caregiver, as directed.   If you are asked to monitor your blood pressure at home, follow the guidelines in the "Diagnosis" section above.  SEEK MEDICAL CARE IF:   You  think you are having medication side effects.   You have recurrent headaches or lightheadedness.   You have swelling in your ankles.   You have trouble with your vision.  SEEK IMMEDIATE MEDICAL CARE IF:   You have sudden onset of chest pain or pressure, difficulty breathing, or other symptoms of a heart attack.   You have a severe headache.   You have symptoms of a stroke (such as sudden weakness, difficulty speaking, difficulty walking).  MAKE SURE YOU:   Understand these instructions.   Will watch your condition.   Will get help right away if you are not doing well or get worse.  Document Released: 11/08/2005 Document Revised: 07/21/2011 Document Reviewed: 06/08/2007 [facility] Patient Information 2012 [place], [place].

## 2012-01-19 NOTE — ED Provider Notes (Signed)
History     CSN: [id number]  Arrival date & time 01/19/12  1750   First MD Initiated Contact with Patient 01/19/12 1804      Chief Complaint  Patient presents with  . Hypertension     Patient is a 66 y.o. male presenting with hypertension. The history is provided by the patient and a relative.  Hypertension This is a recurrent problem. The current episode started more than 1 week ago. The problem occurs constantly. The problem has been gradually worsening. Pertinent negatives include no chest pain, no abdominal pain, no headaches and no shortness of breath. The symptoms are aggravated by nothing. The symptoms are relieved by nothing.  Patient reports his BP is elevated.  He reports recent med changes yesterday (placed on hydralazine and atenolol) and he checked his BP today and it was elevated.  He reports he felt his "heart was racing" but no syncope Denies cp/sob/headache/focal weakness No new visual changes He reports he was not tolerating clonidine and that is why this was discontinued Past Medical History  Diagnosis Date  . Hypertension   . Stroke 2008    History reviewed. No pertinent past surgical history.  No family history on file.  History  Substance Use Topics  . Smoking status: Never Smoker   . Smokeless tobacco: Not on file  . Alcohol Use: No      Review of Systems  Respiratory: Negative for shortness of breath.   Cardiovascular: Negative for chest pain.  Gastrointestinal: Negative for abdominal pain.  Neurological: Negative for headaches.  All other systems reviewed and are negative.    Allergies  Review of patient's allergies indicates no known allergies.  Home Medications   Current Outpatient Rx  Name Route Sig Dispense Refill  . ASPIRIN 81 MG PO CHEW Oral Chew 81 mg by mouth daily.    . ATENOLOL 100 MG PO TABS Oral Take 100 mg by mouth daily.      Marland Kitchen HYDRALAZINE HCL 100 MG PO TABS Oral Take 100 mg by mouth 2 (two) times daily.    . PSYLLIUM  95 % PO PACK Oral Take 1 packet by mouth daily.    . RED YEAST RICE PO Oral Take 2 capsules by mouth daily.    Marland Kitchen TAMSULOSIN HCL 0.4 MG PO CAPS Oral Take 0.4 mg by mouth at bedtime.      BP 221/103  Pulse 58  Temp(Src) 98.2 F (36.8 C) (Oral)  Resp 16  Ht 5\' 8"  (1.727 m)  Wt 214 lb (97.07 kg)  BMI 32.54 kg/m2  SpO2 100% BP 206/106  Pulse 58  Temp(Src) 98.2 F (36.8 C) (Oral)  Resp 16  Ht 5\' 8"  (1.727 m)  Wt 214 lb (97.07 kg)  BMI 32.54 kg/m2  SpO2 100%   Physical Exam CONSTITUTIONAL: Well developed/well nourished HEAD AND FACE: Normocephalic/atraumatic EYES: EOMI/PERRL ENMT: Mucous membranes moist NECK: supple no meningeal signs SPINE:entire spine nontender CV: S1/S2 noted, no murmurs/rubs/gallops noted LUNGS: Lungs are clear to auscultation bilaterally, no apparent distress ABDOMEN: soft, nontender, no rebound or guarding GU:no cva tenderness NEURO: Pt is awake/alert, moves all extremitiesx4 Gait normal No arm/leg drift No facial droop noted No distress is noted EXTREMITIES: pulses normal, full ROM SKIN: warm, color normal PSYCH: no abnormalities of mood noted  ED Course  Procedures    Pt well appearing, no distress, he thought his heart was racing and checked his BP at home and was concerned He has no signs of acute hypertensive emergency He  has f/u with PCP and has further workup planned as outpatient I told him he could take an extra hydralazine tonight then go back to regular dosing He is comfortable with d/c home  The patient appears reasonably screened and/or stabilized for discharge and I doubt any other medical condition or other [facility] requiring further screening, evaluation, or treatment in the ED at this time prior to discharge.  MDM  Nursing notes reviewed and considered in documentation Previous records reviewed and considered    Date: 01/19/2012  Rate: 60  Rhythm: normal sinus rhythm  QRS Axis: left  Intervals: normal  ST/T Wave  abnormalities: normal  Conduction Disutrbances:right bundle branch block  Narrative Interpretation:   Old EKG Reviewed: changes noted          Joya Gaskins, MD 01/19/12 1926

## 2012-01-19 NOTE — ED Notes (Signed)
Pt sts his BP is elevated and he was taken off of 2 BP meds yesterday.

## 2012-01-21 ENCOUNTER — Ambulatory Visit
Admission: RE | Admit: 2012-01-21 | Discharge: 2012-01-21 | Disposition: A | Discharge status: Home / Self Care | Payer: Medicare Other | Source: Ambulatory Visit | Attending: Family Medicine | Admitting: Family Medicine

## 2012-01-21 DIAGNOSIS — I10 Essential (primary) hypertension: Secondary | ICD-10-CM

## 2012-04-14 ENCOUNTER — Other Ambulatory Visit: Payer: Self-pay | Admitting: Cardiology

## 2012-04-14 DIAGNOSIS — I701 Atherosclerosis of renal artery: Secondary | ICD-10-CM

## 2012-04-14 DIAGNOSIS — D3500 Benign neoplasm of unspecified adrenal gland: Secondary | ICD-10-CM

## 2012-04-21 ENCOUNTER — Other Ambulatory Visit: Payer: Medicare Other

## 2012-04-21 ENCOUNTER — Ambulatory Visit
Admission: RE | Admit: 2012-04-21 | Discharge: 2012-04-21 | Disposition: A | Discharge status: Home / Self Care | Payer: Medicare Other | Source: Ambulatory Visit | Attending: Cardiology | Admitting: Cardiology

## 2012-04-21 DIAGNOSIS — D3500 Benign neoplasm of unspecified adrenal gland: Secondary | ICD-10-CM

## 2012-04-21 DIAGNOSIS — I701 Atherosclerosis of renal artery: Secondary | ICD-10-CM

## 2012-04-21 MED ORDER — GADOBENATE DIMEGLUMINE 529 MG/ML IV SOLN
19.0000 mL | Freq: Once | INTRAVENOUS | Status: AC | PRN
  Administered 2012-04-21: 19 mL via INTRAVENOUS

## 2012-04-23 ENCOUNTER — Other Ambulatory Visit: Payer: Medicare Other

## 2012-04-24 ENCOUNTER — Other Ambulatory Visit: Payer: Medicare Other

## 2012-04-25 ENCOUNTER — Ambulatory Visit
Admission: RE | Admit: 2012-04-25 | Discharge: 2012-04-25 | Disposition: A | Discharge status: Home / Self Care | Payer: Medicare Other | Source: Ambulatory Visit | Attending: Cardiology | Admitting: Cardiology

## 2012-04-25 DIAGNOSIS — I701 Atherosclerosis of renal artery: Secondary | ICD-10-CM

## 2012-04-25 DIAGNOSIS — D3500 Benign neoplasm of unspecified adrenal gland: Secondary | ICD-10-CM

## 2012-04-25 MED ORDER — GADOBENATE DIMEGLUMINE 529 MG/ML IV SOLN
19.0000 mL | Freq: Once | INTRAVENOUS | Status: AC | PRN
  Administered 2012-04-25: 19 mL via INTRAVENOUS

## 2012-08-23 DIAGNOSIS — I639 Cerebral infarction, unspecified: Secondary | ICD-10-CM | POA: Insufficient documentation

## 2013-01-31 DIAGNOSIS — Z79899 Other long term (current) drug therapy: Secondary | ICD-10-CM | POA: Insufficient documentation

## 2013-01-31 DIAGNOSIS — Z8673 Personal history of transient ischemic attack (TIA), and cerebral infarction without residual deficits: Secondary | ICD-10-CM | POA: Insufficient documentation

## 2013-01-31 DIAGNOSIS — I10 Essential (primary) hypertension: Secondary | ICD-10-CM | POA: Insufficient documentation

## 2013-01-31 DIAGNOSIS — Z7982 Long term (current) use of aspirin: Secondary | ICD-10-CM | POA: Insufficient documentation

## 2013-02-01 ENCOUNTER — Encounter (HOSPITAL_BASED_OUTPATIENT_CLINIC_OR_DEPARTMENT_OTHER): Payer: Self-pay | Admitting: *Deleted

## 2013-02-01 ENCOUNTER — Emergency Department (HOSPITAL_BASED_OUTPATIENT_CLINIC_OR_DEPARTMENT_OTHER)
Admission: EM | Admit: 2013-02-01 | Discharge: 2013-02-01 | Disposition: A | Discharge status: Home / Self Care | Payer: Medicare Other | Attending: Emergency Medicine | Admitting: Emergency Medicine

## 2013-02-01 DIAGNOSIS — I10 Essential (primary) hypertension: Secondary | ICD-10-CM

## 2013-02-01 NOTE — ED Notes (Signed)
MD at bedside. 

## 2013-02-01 NOTE — ED Notes (Addendum)
Patient states that his BP increased tonight. Denies any other symptoms. States he is taking his medication as directed.

## 2013-02-01 NOTE — ED Provider Notes (Signed)
History     CSN: [id number]  Arrival date & time 01/31/13  2355   First MD Initiated Contact with Patient 02/01/13 0211      Chief Complaint  Patient presents with  . Hypertension    (Consider location/radiation/quality/duration/timing/severity/associated sxs/prior treatment) HPI This is a 67 year old male with a history of hypertension on antihypertensives. He took his blood pressure earlier and found it to be elevated with systolic greater than 200 and diastolic greater than 100. He is accustomed to have a systolic less than 180 and a diastolic less than 90. He became concerned about his elevated blood pressure and decided whether come here and be checked them to lie in bed and worry about it all night. He denies any associated symptomatology. Specifically he denies any neurologic symptoms, visual changes, chest pain, shortness of breath or headache. He did not take any additional medications for this.   Past Medical History  Diagnosis Date  . Hypertension   . Stroke 2008    History reviewed. No pertinent past surgical history.  No family history on file.  History  Substance Use Topics  . Smoking status: Never Smoker   . Smokeless tobacco: Not on file  . Alcohol Use: No      Review of Systems  All other systems reviewed and are negative.    Allergies  Review of patient's allergies indicates no known allergies.  Home Medications   Current Outpatient Rx  Name  Route  Sig  Dispense  Refill  . chlorthalidone (HYGROTON) 25 MG tablet   Oral   Take 25 mg by mouth daily.         . nebivolol (BYSTOLIC) 10 MG tablet   Oral   Take 10 mg by mouth daily.         Marland Kitchen aspirin 81 MG chewable tablet   Oral   Chew 81 mg by mouth daily.         Marland Kitchen atenolol (TENORMIN) 100 MG tablet   Oral   Take 100 mg by mouth daily.           . hydrALAZINE (APRESOLINE) 100 MG tablet   Oral   Take 100 mg by mouth 2 (two) times daily.         . psyllium (HYDROCIL/METAMUCIL)  95 % PACK   Oral   Take 1 packet by mouth daily.         . Red Yeast Rice Extract (RED YEAST RICE PO)   Oral   Take 2 capsules by mouth daily.         . Tamsulosin HCl (FLOMAX) 0.4 MG CAPS   Oral   Take 0.4 mg by mouth at bedtime.           BP 186/100  Pulse 50  Temp(Src) 97.8 F (36.6 C) (Oral)  Resp 16  Ht 5' 8.5" (1.74 m)  Wt 212 lb (96.163 kg)  BMI 31.76 kg/m2  SpO2 100%  Physical Exam General: Well-developed, well-nourished male in no acute distress; appearance consistent with age of record HENT: normocephalic, atraumatic Eyes: pupils equal round and reactive to light; extraocular muscles intact Neck: supple Heart: regular rate and rhythm Lungs: clear to auscultation bilaterally Abdomen: soft; nondistended Extremities: No deformity; full range of motion; pulses normal; no edema Neurologic: Awake, alert and oriented; motor function intact in all extremities and symmetric; no facial droop Skin: Warm and dry Psychiatric: Normal mood and affect    ED Course  Procedures (including critical care time)  MDM  Patient was reassured that his blood pressure in the absence of evidence of endorgan damage does not require emergent intervention. He has an appointment in 6 days at [facility] where he will address his blood pressure control.        Hanley Seamen, MD 02/01/13 [phone]

## 2013-03-19 ENCOUNTER — Emergency Department (HOSPITAL_BASED_OUTPATIENT_CLINIC_OR_DEPARTMENT_OTHER)
Admission: EM | Admit: 2013-03-19 | Discharge: 2013-03-19 | Disposition: A | Discharge status: Home / Self Care | Payer: Medicare Other | Attending: Emergency Medicine | Admitting: Emergency Medicine

## 2013-03-19 ENCOUNTER — Encounter (HOSPITAL_BASED_OUTPATIENT_CLINIC_OR_DEPARTMENT_OTHER): Payer: Self-pay | Admitting: Family Medicine

## 2013-03-19 DIAGNOSIS — Z8673 Personal history of transient ischemic attack (TIA), and cerebral infarction without residual deficits: Secondary | ICD-10-CM | POA: Insufficient documentation

## 2013-03-19 DIAGNOSIS — I10 Essential (primary) hypertension: Secondary | ICD-10-CM | POA: Insufficient documentation

## 2013-03-19 DIAGNOSIS — Z79899 Other long term (current) drug therapy: Secondary | ICD-10-CM | POA: Insufficient documentation

## 2013-03-19 DIAGNOSIS — Z7982 Long term (current) use of aspirin: Secondary | ICD-10-CM | POA: Insufficient documentation

## 2013-03-19 DIAGNOSIS — M545 Low back pain, unspecified: Secondary | ICD-10-CM | POA: Insufficient documentation

## 2013-03-19 MED ORDER — CYCLOBENZAPRINE HCL 10 MG PO TABS: 10.0000 mg | ORAL_TABLET | Freq: Two times a day (BID) | ORAL | Status: DC | PRN

## 2013-03-19 MED ORDER — HYDROCODONE-ACETAMINOPHEN 5-325 MG PO TABS: 1.0000 | ORAL_TABLET | Freq: Four times a day (QID) | ORAL | Status: DC | PRN
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
## 2013-03-19 NOTE — ED Notes (Signed)
Pt c/o right low back pain x 2 months. Pt sts he has seen Dr. Loleta Chance for back and had MRI last week. Pt sts pain is worse today.

## 2013-07-05 ENCOUNTER — Emergency Department (HOSPITAL_BASED_OUTPATIENT_CLINIC_OR_DEPARTMENT_OTHER)
Admission: EM | Admit: 2013-07-05 | Discharge: 2013-07-05 | Disposition: A | Discharge status: Home / Self Care | Payer: Medicare Other | Attending: Emergency Medicine | Admitting: Emergency Medicine

## 2013-07-05 ENCOUNTER — Emergency Department (HOSPITAL_BASED_OUTPATIENT_CLINIC_OR_DEPARTMENT_OTHER): Payer: Medicare Other

## 2013-07-05 ENCOUNTER — Encounter (HOSPITAL_BASED_OUTPATIENT_CLINIC_OR_DEPARTMENT_OTHER): Payer: Self-pay | Admitting: Student

## 2013-07-05 DIAGNOSIS — Z79899 Other long term (current) drug therapy: Secondary | ICD-10-CM | POA: Insufficient documentation

## 2013-07-05 DIAGNOSIS — Z23 Encounter for immunization: Secondary | ICD-10-CM | POA: Insufficient documentation

## 2013-07-05 DIAGNOSIS — S62609A Fracture of unspecified phalanx of unspecified finger, initial encounter for closed fracture: Secondary | ICD-10-CM

## 2013-07-05 DIAGNOSIS — Z7982 Long term (current) use of aspirin: Secondary | ICD-10-CM | POA: Insufficient documentation

## 2013-07-05 DIAGNOSIS — W230XXA Caught, crushed, jammed, or pinched between moving objects, initial encounter: Secondary | ICD-10-CM | POA: Insufficient documentation

## 2013-07-05 DIAGNOSIS — S62639A Displaced fracture of distal phalanx of unspecified finger, initial encounter for closed fracture: Secondary | ICD-10-CM | POA: Insufficient documentation

## 2013-07-05 DIAGNOSIS — I10 Essential (primary) hypertension: Secondary | ICD-10-CM | POA: Insufficient documentation

## 2013-07-05 DIAGNOSIS — Z8673 Personal history of transient ischemic attack (TIA), and cerebral infarction without residual deficits: Secondary | ICD-10-CM | POA: Insufficient documentation

## 2013-07-05 DIAGNOSIS — Y929 Unspecified place or not applicable: Secondary | ICD-10-CM | POA: Insufficient documentation

## 2013-07-05 DIAGNOSIS — Y9389 Activity, other specified: Secondary | ICD-10-CM | POA: Insufficient documentation

## 2013-07-05 MED ORDER — TETANUS-DIPHTH-ACELL PERTUSSIS 5-2.5-18.5 LF-MCG/0.5 IM SUSP
0.5000 mL | Freq: Once | INTRAMUSCULAR | Status: AC
  Administered 2013-07-05: 0.5 mL via INTRAMUSCULAR
  Filled 2013-07-05: qty 0.5

## 2013-07-05 NOTE — ED Provider Notes (Signed)
CSN: [id number]     Arrival date & time 07/05/13  1952 History     First MD Initiated Contact with Patient 07/05/13 2010     Chief Complaint  Patient presents with  . Hand Pain    right hand digit 4    (Consider location/radiation/quality/duration/timing/severity/associated sxs/prior Treatment) Patient is a 67 y.o. male presenting with hand pain and hand injury.  Hand Pain This is a new problem. The current episode started less than 1 hour ago. The problem occurs constantly. The problem has not changed since onset.Pertinent negatives include no chest pain, no abdominal pain and no shortness of breath. Exacerbated by: movement. Nothing relieves the symptoms. He has tried nothing for the symptoms.  Hand Injury Location:  Hand Time since incident:  1 hour Injury: yes   Hand location:  R hand Pain details:    Quality:  Throbbing   Radiates to:  Does not radiate   Severity:  Moderate   Onset quality:  Sudden   Timing:  Constant   Progression:  Unchanged Handedness:  Right-handed Tetanus status:  Out of date Associated symptoms: decreased range of motion (pain) and swelling   Associated symptoms: no fever, no stiffness and no tingling     Past Medical History  Diagnosis Date  . Hypertension   . Stroke 2008   History reviewed. No pertinent past surgical history. History reviewed. No pertinent family history. History  Substance Use Topics  . Smoking status: Never Smoker   . Smokeless tobacco: Not on file  . Alcohol Use: No    Review of Systems  Constitutional: Negative for fever.  HENT: Negative for congestion.   Respiratory: Negative for cough and shortness of breath.   Cardiovascular: Negative for chest pain.  Gastrointestinal: Negative for nausea, vomiting, abdominal pain and diarrhea.  Musculoskeletal: Negative for stiffness.  All other systems reviewed and are negative.    Allergies  Review of patient's allergies indicates no known allergies.  Home  Medications   Current Outpatient Rx  Name  Route  Sig  Dispense  Refill  . aspirin 81 MG chewable tablet   Oral   Chew 81 mg by mouth daily.         Marland Kitchen atenolol (TENORMIN) 100 MG tablet   Oral   Take 100 mg by mouth daily.           . chlorthalidone (HYGROTON) 25 MG tablet   Oral   Take 25 mg by mouth daily.         . cyclobenzaprine (FLEXERIL) 10 MG tablet   Oral   Take 1 tablet (10 mg total) by mouth 2 (two) times daily as needed for muscle spasms.   20 tablet   0   . hydrALAZINE (APRESOLINE) 100 MG tablet   Oral   Take 100 mg by mouth 2 (two) times daily.         Marland Kitchen HYDROcodone-acetaminophen (NORCO/VICODIN) 5-325 MG per tablet   Oral   Take 1 tablet by mouth every 6 (six) hours as needed for pain.   20 tablet   0   . nebivolol (BYSTOLIC) 10 MG tablet   Oral   Take 10 mg by mouth daily.         . psyllium (HYDROCIL/METAMUCIL) 95 % PACK   Oral   Take 1 packet by mouth daily.         . Red Yeast Rice Extract (RED YEAST RICE PO)   Oral   Take 2 capsules by mouth  daily.         . Tamsulosin HCl (FLOMAX) 0.4 MG CAPS   Oral   Take 0.4 mg by mouth at bedtime.          BP 191/100  Pulse 46  Temp(Src) 98.2 F (36.8 C) (Oral)  Ht 5\' 9"  (1.753 m)  Wt 205 lb (92.987 kg)  BMI 30.26 kg/m2  SpO2 99% Physical Exam  Nursing note and vitals reviewed. Constitutional: He is oriented to person, place, and time. He appears well-developed and well-nourished. No distress.  HENT:  Head: Normocephalic and atraumatic.  Mouth/Throat: Oropharynx is clear and moist.  Eyes: Conjunctivae are normal. Pupils are equal, round, and reactive to light. No scleral icterus.  Neck: Neck supple.  Cardiovascular: Normal rate, regular rhythm, normal heart sounds and intact distal pulses.   No murmur heard. Pulmonary/Chest: Effort normal and breath sounds normal. No stridor. No respiratory distress. He has no wheezes. He has no rales.  Abdominal: Soft. He exhibits no  distension. There is no tenderness.  Musculoskeletal: Normal range of motion. He exhibits no edema.       Right hand: He exhibits tenderness. He exhibits normal range of motion, normal capillary refill and no laceration (abrasion of distal 4th finger). Normal sensation noted.  Neurological: He is alert and oriented to person, place, and time.  Skin: Skin is warm and dry. No rash noted.  Psychiatric: He has a normal mood and affect. His behavior is normal.    ED Course   Procedures (including critical care time)  Labs Reviewed - No data to display EKG - sinus brady, rate 50, LAD, RBBB, similar to prior.   Dg Finger Ring Right  07/05/2013   *RADIOLOGY REPORT*  Clinical Data: Ring finger pain at PIP joint after injury.  RIGHT RING FINGER 2+V  Comparison: None.  Findings: Dorsal avulsion fracture involving the proximal most aspect of the distal phalanx of the fourth digit.  Intra-articular extension with minimal displacement  IMPRESSION: Fracture of the proximal aspect of the distal phalanx of the fourth digit.   Original Report Authenticated By: Jeronimo Greaves, M.D.  All radiology studies independently viewed by me.    1. Finger fracture, right, closed, initial encounter     MDM  67 yo male with injury to right ring finger after getting it caught in a dog run cable.  No other complaints and no other injuries.  He just wants to see if it's broken.  Declines pain meds.   Distal phalangeal fracture . Placed in splint.    Candyce Churn, MD 07/06/13 1116

## 2013-07-05 NOTE — ED Notes (Signed)
Laceration to digit 4 on right hand

## 2013-07-05 NOTE — ED Notes (Signed)
Patient transported to X-ray 

## 2013-07-10 ENCOUNTER — Encounter: Payer: Self-pay | Admitting: Family Medicine

## 2013-07-10 ENCOUNTER — Ambulatory Visit (INDEPENDENT_AMBULATORY_CARE_PROVIDER_SITE_OTHER): Payer: Medicare Other | Admitting: Family Medicine

## 2013-07-10 VITALS — BP 167/88 | HR 50 | Ht 68.0 in | Wt 205.0 lb

## 2013-07-10 DIAGNOSIS — IMO0001 Reserved for inherently not codable concepts without codable children: Secondary | ICD-10-CM

## 2013-07-10 DIAGNOSIS — M20019 Mallet finger of unspecified finger(s): Secondary | ICD-10-CM

## 2013-07-10 NOTE — Patient Instructions (Addendum)
You have a mallet finger avulsion of your ring finger. Wear the splint at all times except when washing your finger. When washing the finger do so as I showed you - put your finger on the sink or counter and keep it extended - VERY IMPORTANT. Then slip the splint off, wash, and reapply the splint while keeping the finger extended. Ok to ice 15 minutes at a time as needed. Tylenol or aleve as needed for pain. Follow up with me in 2 weeks. You will need to splint this for 6 total weeks (and keep it extended that whole time).  Then it will be 2 weeks only when you're sleeping.

## 2013-07-11 ENCOUNTER — Encounter: Payer: Self-pay | Admitting: Family Medicine

## 2013-07-11 DIAGNOSIS — IMO0001 Reserved for inherently not codable concepts without codable children: Secondary | ICD-10-CM | POA: Insufficient documentation

## 2013-07-11 NOTE — Assessment & Plan Note (Signed)
Right 4th digit mallet finger with avulsion - discussed importance of maintaining DIP joint in extension for 6 straight weeks with another 2 weeks of nighttime splinting.  Icing, tylenol, aleve as needed.  F/u in 2 weeks to reassess compliance with splinting, examine.  Shown how to wash this digit without flexing it.

## 2013-07-11 NOTE — Progress Notes (Signed)
Patient ID: Patrick Roth, male   DOB: [birth year], 67 y.o.   MRN: [id number]  PCP: Dr. Parke Simmers  Subjective:   HPI: Patient is a 67 y.o. male here for right ring finger injury.  Patient reports on 8/14 he was putting his dog on a leash (125 pound dog). His right ring finger was caught up in the leash and dog jerked this away causing traction type injury to finger he believes. Is right handed. + swelling. Went to ED and had x-rays showing an avulsion fracture of distal phalanx.   Placed in a volar finger splint and referred here. Not having a lot of pain currently.  Past Medical History  Diagnosis Date  . Hypertension   . Stroke 2008    Current Outpatient Prescriptions on File Prior to Visit  Medication Sig Dispense Refill  . aspirin 81 MG chewable tablet Chew 81 mg by mouth daily.      Marland Kitchen atenolol (TENORMIN) 100 MG tablet Take 100 mg by mouth daily.        . chlorthalidone (HYGROTON) 25 MG tablet Take 25 mg by mouth daily.      . hydrALAZINE (APRESOLINE) 100 MG tablet Take 100 mg by mouth 2 (two) times daily.      . nebivolol (BYSTOLIC) 10 MG tablet Take 10 mg by mouth daily.      . psyllium (HYDROCIL/METAMUCIL) 95 % PACK Take 1 packet by mouth daily.      . Red Yeast Rice Extract (RED YEAST RICE PO) Take 2 capsules by mouth daily.      . Tamsulosin HCl (FLOMAX) 0.4 MG CAPS Take 0.4 mg by mouth at bedtime.       No current facility-administered medications on file prior to visit.    Past Surgical History  Procedure Laterality Date  . Great toe surgery      No Known Allergies  History   Social History  . Marital Status: Married    Spouse Name: N/A    Number of Children: N/A  . Years of Education: N/A   Occupational History  . Not on file.   Social History Main Topics  . Smoking status: Never Smoker   . Smokeless tobacco: Not on file  . Alcohol Use: No  . Drug Use: No  . Sexual Activity: Not on file   Other Topics Concern  . Not on file   Social History  Narrative  . No narrative on file    Family History  Problem Relation Age of Onset  . Hypertension Father   . Diabetes Neg Hx   . Heart attack Neg Hx   . Hyperlipidemia Neg Hx   . Sudden death Neg Hx     BP 167/88  Pulse 50  Ht 5\' 8"  (1.727 m)  Wt 205 lb (92.987 kg)  BMI 31.18 kg/m2  Review of Systems: See HPI above.    Objective:  Physical Exam:  Gen: NAD  R hand: Mild swelling at IP joint of 4th digit.  No bruising, angulation, apparent malrotation. TTP proximal distal phalanx of 4th digit dorsally.  No other tenderness. Did not test flexion with this type of injury.  Pain and unable to resist extension. Collateral ligaments intact.  NVI distally.    Assessment & Plan:  1. Right 4th digit mallet finger with avulsion - discussed importance of maintaining DIP joint in extension for 6 straight weeks with another 2 weeks of nighttime splinting.  Icing, tylenol, aleve as needed.  F/u in 2  weeks to reassess compliance with splinting, examine.  Shown how to wash this digit without flexing it.

## 2013-07-24 ENCOUNTER — Ambulatory Visit (INDEPENDENT_AMBULATORY_CARE_PROVIDER_SITE_OTHER): Payer: Medicare Other | Admitting: Family Medicine

## 2013-07-24 ENCOUNTER — Encounter: Payer: Self-pay | Admitting: Family Medicine

## 2013-07-24 VITALS — BP 184/99 | HR 51 | Ht 69.0 in | Wt 210.0 lb

## 2013-07-24 DIAGNOSIS — M20019 Mallet finger of unspecified finger(s): Secondary | ICD-10-CM

## 2013-07-24 DIAGNOSIS — IMO0001 Reserved for inherently not codable concepts without codable children: Secondary | ICD-10-CM

## 2013-07-24 NOTE — Patient Instructions (Addendum)
Wear splint for 4 more weeks at all times except to wash (keep this joint extended as you have been when you clean it though). Then after this only splint at nighttime for 2 weeks. Then you should be done with splinting. Follow up with me in 4-6 weeks if you're having any problems.

## 2013-07-25 ENCOUNTER — Encounter: Payer: Self-pay | Admitting: Family Medicine

## 2013-07-25 NOTE — Progress Notes (Signed)
Patient ID: Patrick Roth, male   DOB: [birth year], 67 y.o.   MRN: [id number]  PCP: Dr. Parke Simmers  Subjective:   HPI: Patient is a 67 y.o. male here for f/u right ring finger injury.  8/19: Patient reports on 8/14 he was putting his dog on a leash (125 pound dog). His right ring finger was caught up in the leash and dog jerked this away causing traction type injury to finger he believes. Is right handed. + swelling. Went to ED and had x-rays showing an avulsion fracture of distal phalanx.   Placed in a volar finger splint and referred here. Not having a lot of pain currently.  9/2: Patient reports he has been compliant with extension splinting of DIP 4th digit. Able to bend MCP and PIP. Not taking any medications. Some stiffness but otherwise pleased with his progress.  Past Medical History  Diagnosis Date  . Hypertension   . Stroke 2008    Current Outpatient Prescriptions on File Prior to Visit  Medication Sig Dispense Refill  . aspirin 81 MG chewable tablet Chew 81 mg by mouth daily.      Marland Kitchen atenolol (TENORMIN) 100 MG tablet Take 100 mg by mouth daily.        . chlorthalidone (HYGROTON) 25 MG tablet Take 25 mg by mouth daily.      . hydrALAZINE (APRESOLINE) 100 MG tablet Take 100 mg by mouth 2 (two) times daily.      . nebivolol (BYSTOLIC) 10 MG tablet Take 10 mg by mouth daily.      . psyllium (HYDROCIL/METAMUCIL) 95 % PACK Take 1 packet by mouth daily.      . Red Yeast Rice Extract (RED YEAST RICE PO) Take 2 capsules by mouth daily.      . Tamsulosin HCl (FLOMAX) 0.4 MG CAPS Take 0.4 mg by mouth at bedtime.      . triamterene-hydrochlorothiazide (MAXZIDE-25) 37.5-25 MG per tablet        No current facility-administered medications on file prior to visit.    Past Surgical History  Procedure Laterality Date  . Great toe surgery      No Known Allergies  History   Social History  . Marital Status: Married    Spouse Name: N/A    Number of Children: N/A  . Years of  Education: N/A   Occupational History  . Not on file.   Social History Main Topics  . Smoking status: Never Smoker   . Smokeless tobacco: Not on file  . Alcohol Use: No  . Drug Use: No  . Sexual Activity: Not on file   Other Topics Concern  . Not on file   Social History Narrative  . No narrative on file    Family History  Problem Relation Age of Onset  . Hypertension Father   . Diabetes Neg Hx   . Heart attack Neg Hx   . Hyperlipidemia Neg Hx   . Sudden death Neg Hx     BP 184/99  Pulse 51  Ht 5\' 9"  (1.753 m)  Wt 210 lb (95.255 kg)  BMI 31 kg/m2  Review of Systems: See HPI above.    Objective:  Physical Exam:  Gen: NAD  R hand: Splint not removed. Mild swelling at IP joint of 4th digit.  No bruising, angulation. Minimal TTP proximal distal phalanx.  No other tenderness. Did not test DIP motion with known mallet avulsion. Collateral ligaments intact.  NVI distally.    Assessment & Plan:  1. Right 4th digit mallet finger with avulsion - continue 4 more weeks of regular extension splinting of DIP with 2 more weeks of nighttime splinting after this.  Icing, tylenol, aleve if needed.  F/u in 6 weeks or as needed.

## 2013-07-25 NOTE — Assessment & Plan Note (Signed)
Right 4th digit mallet finger with avulsion - continue 4 more weeks of regular extension splinting of DIP with 2 more weeks of nighttime splinting after this.  Icing, tylenol, aleve if needed.  F/u in 6 weeks or as needed.

## 2014-05-15 ENCOUNTER — Encounter (HOSPITAL_BASED_OUTPATIENT_CLINIC_OR_DEPARTMENT_OTHER): Payer: Self-pay | Admitting: Emergency Medicine

## 2014-05-15 ENCOUNTER — Emergency Department (HOSPITAL_BASED_OUTPATIENT_CLINIC_OR_DEPARTMENT_OTHER)
Admission: EM | Admit: 2014-05-15 | Discharge: 2014-05-16 | Disposition: A | Discharge status: Home / Self Care | Payer: Medicare Other | Attending: Emergency Medicine | Admitting: Emergency Medicine

## 2014-05-15 ENCOUNTER — Emergency Department (HOSPITAL_BASED_OUTPATIENT_CLINIC_OR_DEPARTMENT_OTHER): Payer: Medicare Other

## 2014-05-15 DIAGNOSIS — Z79899 Other long term (current) drug therapy: Secondary | ICD-10-CM | POA: Insufficient documentation

## 2014-05-15 DIAGNOSIS — M5431 Sciatica, right side: Secondary | ICD-10-CM

## 2014-05-15 DIAGNOSIS — I10 Essential (primary) hypertension: Secondary | ICD-10-CM | POA: Insufficient documentation

## 2014-05-15 DIAGNOSIS — M5430 Sciatica, unspecified side: Secondary | ICD-10-CM | POA: Insufficient documentation

## 2014-05-15 DIAGNOSIS — Z8673 Personal history of transient ischemic attack (TIA), and cerebral infarction without residual deficits: Secondary | ICD-10-CM | POA: Insufficient documentation

## 2014-05-15 DIAGNOSIS — Z7982 Long term (current) use of aspirin: Secondary | ICD-10-CM | POA: Insufficient documentation

## 2014-05-15 MED ORDER — ONDANSETRON HCL 4 MG/2ML IJ SOLN
4.0000 mg | Freq: Once | INTRAMUSCULAR | Status: AC | PRN
  Administered 2014-05-15: 4 mg via INTRAVENOUS
  Filled 2014-05-15: qty 2

## 2014-05-15 MED ORDER — HYDROMORPHONE HCL PF 1 MG/ML IJ SOLN
1.0000 mg | INTRAMUSCULAR | Status: AC | PRN
  Administered 2014-05-15 – 2014-05-16 (×3): 1 mg via INTRAVENOUS
  Filled 2014-05-15 (×3): qty 1

## 2014-05-15 NOTE — ED Provider Notes (Signed)
CSN: [id number]     Arrival date & time 05/15/14  2203 History   First MD Initiated Contact with Patient 05/15/14 2236     This chart was scribed for Dorie Rank, MD by Forrestine Him, ED Scribe. This patient was seen in room MH12/MH12 and the patient's care was started 10:38 PM.   Chief Complaint  Patient presents with  . Back Pain   HPI  HPI Comments: Patrick Roth is a 68 y.o. male with a PMHx of HTN and stroke who presents to the Emergency Department complaining of constant, moderate R sided lower back pain that radiates down the R lower extremity x 1 week that has progressively worsened. Pt states this pain is exacerbated with all movements. No alleviating factors at this time. He denies a history of sciatica. No nausea, abdominal pain, numbness, loss of sensation, vomiting. He denies any bowel or urinary incontinence. No known allergies to medications. He has no other pertinent past medical history. No other concerns this visit.  Past Medical History  Diagnosis Date  . Hypertension   . Stroke 2008   Past Surgical History  Procedure Laterality Date  . Great toe surgery     Family History  Problem Relation Age of Onset  . Hypertension Father   . Diabetes Neg Hx   . Heart attack Neg Hx   . Hyperlipidemia Neg Hx   . Sudden death Neg Hx    History  Substance Use Topics  . Smoking status: Never Smoker   . Smokeless tobacco: Not on file  . Alcohol Use: No    Review of Systems  Musculoskeletal: Positive for back pain.  All other systems reviewed and are negative.     Allergies  Review of patient's allergies indicates no known allergies.  Home Medications   Prior to Admission medications   Medication Sig Start Date End Date Taking? Authorizing Provider  aspirin 81 MG chewable tablet Chew 81 mg by mouth daily.    Historical Provider, MD  atenolol (TENORMIN) 100 MG tablet Take 100 mg by mouth daily.      Historical Provider, MD  chlorthalidone (HYGROTON) 25 MG tablet Take  25 mg by mouth daily.    Historical Provider, MD  hydrALAZINE (APRESOLINE) 100 MG tablet Take 100 mg by mouth 2 (two) times daily.    Historical Provider, MD  nebivolol (BYSTOLIC) 10 MG tablet Take 10 mg by mouth daily.    Historical Provider, MD  psyllium (HYDROCIL/METAMUCIL) 95 % PACK Take 1 packet by mouth daily.    Historical Provider, MD  Red Yeast Rice Extract (RED YEAST RICE PO) Take 2 capsules by mouth daily.    Historical Provider, MD  Tamsulosin HCl (FLOMAX) 0.4 MG CAPS Take 0.4 mg by mouth at bedtime.    Historical Provider, MD  triamterene-hydrochlorothiazide (MAXZIDE-25) 37.5-25 MG per tablet  06/04/13   Historical Provider, MD   Triage Vitals: BP 196/101  Pulse 60  Temp(Src) 98.7 F (37.1 C) (Oral)  Resp 16  Wt 205 lb (92.987 kg)  SpO2 100%   Physical Exam  Nursing note and vitals reviewed. Constitutional: He appears well-developed and well-nourished. No distress.  HENT:  Head: Normocephalic and atraumatic.  Right Ear: External ear normal.  Left Ear: External ear normal.  Nose: Nose normal.  Eyes: Conjunctivae and EOM are normal. Right eye exhibits no discharge. Left eye exhibits no discharge. No scleral icterus.  Neck: Neck supple. No tracheal deviation present.  Cardiovascular: Normal rate, regular rhythm and intact distal  pulses.   Pulmonary/Chest: Effort normal and breath sounds normal. No stridor. No respiratory distress. He has no wheezes. He has no rales.  Abdominal: Soft. Bowel sounds are normal. He exhibits no distension. There is no tenderness. There is no rebound and no guarding.  Musculoskeletal: He exhibits no edema and no tenderness.       Lumbar back: He exhibits decreased range of motion, pain and spasm. He exhibits no swelling and no edema.  Neurological: He is alert. He has normal strength. He is not disoriented. No cranial nerve deficit (no facial droop, extraocular movements intact, no slurred speech) or sensory deficit. He exhibits normal muscle tone.  He displays no seizure activity. Coordination normal.  Skin: Skin is warm and dry. No rash noted. He is not diaphoretic. No erythema.  Psychiatric: He has a normal mood and affect. His behavior is normal. Thought content normal.    ED Course  Procedures (including critical care time)  DIAGNOSTIC STUDIES: Oxygen Saturation is 100% on RA, Normal by my interpretation.    COORDINATION OF CARE: 10:40 PM-Discussed treatment plan with pt at bedside and pt agreed to plan.      Imaging Review No results found.    MDM   Pt's symptoms are suggestive of sciatica.  Given pain meds in the ED.  Will dc home with pain medications.  I personally performed the services described in this documentation, which was scribed in my presence. The recorded information has been reviewed and is accurate.     Dorie Rank, MD 05/18/14 [phone]

## 2014-05-15 NOTE — ED Notes (Signed)
Pt c/o lower back pan which radiates down right leg

## 2014-05-16 MED ORDER — HYDROCODONE-ACETAMINOPHEN 5-325 MG PO TABS: 1.0000 | ORAL_TABLET | ORAL | Status: DC | PRN

## 2014-05-16 MED ORDER — CYCLOBENZAPRINE HCL 5 MG PO TABS: 5.0000 mg | ORAL_TABLET | Freq: Three times a day (TID) | ORAL | Status: DC | PRN

## 2014-05-16 NOTE — Discharge Instructions (Signed)
Sciatica Sciatica is pain, weakness, numbness, or tingling along the path of the sciatic nerve. The nerve starts in the lower back and runs down the back of each leg. The nerve controls the muscles in the lower leg and in the back of the knee, while also providing sensation to the back of the thigh, lower leg, and the sole of your foot. Sciatica is a symptom of another medical condition. For instance, nerve damage or certain conditions, such as a herniated disk or bone spur on the spine, pinch or put pressure on the sciatic nerve. This causes the pain, weakness, or other sensations normally associated with sciatica. Generally, sciatica only affects one side of the body. CAUSES   Herniated or slipped disc.  Degenerative disk disease.  A pain disorder involving the narrow muscle in the buttocks (piriformis syndrome).  Pelvic injury or fracture.  Pregnancy.  Tumor (rare). SYMPTOMS  Symptoms can vary from mild to very severe. The symptoms usually travel from the low back to the buttocks and down the back of the leg. Symptoms can include:  Mild tingling or dull aches in the lower back, leg, or hip.  Numbness in the back of the calf or sole of the foot.  Burning sensations in the lower back, leg, or hip.  Sharp pains in the lower back, leg, or hip.  Leg weakness.  Severe back pain inhibiting movement. These symptoms may get worse with coughing, sneezing, laughing, or prolonged sitting or standing. Also, being overweight may worsen symptoms. DIAGNOSIS  Your caregiver will perform a physical exam to look for common symptoms of sciatica. He or she may ask you to do certain movements or activities that would trigger sciatic nerve pain. Other tests may be performed to find the cause of the sciatica. These may include:  Blood tests.  X-rays.  Imaging tests, such as an MRI or CT scan. TREATMENT  Treatment is directed at the cause of the sciatic pain. Sometimes, treatment is not necessary  and the pain and discomfort goes away on its own. If treatment is needed, your caregiver may suggest:  Over-the-counter medicines to relieve pain.  Prescription medicines, such as anti-inflammatory medicine, muscle relaxants, or narcotics.  Applying heat or ice to the painful area.  Steroid injections to lessen pain, irritation, and inflammation around the nerve.  Reducing activity during periods of pain.  Exercising and stretching to strengthen your abdomen and improve flexibility of your spine. Your caregiver may suggest losing weight if the extra weight makes the back pain worse.  Physical therapy.  Surgery to eliminate what is pressing or pinching the nerve, such as a bone spur or part of a herniated disk. HOME CARE INSTRUCTIONS   Only take over-the-counter or prescription medicines for pain or discomfort as directed by your caregiver.  Apply ice to the affected area for 20 minutes, 3-4 times a day for the first 48-72 hours. Then try heat in the same way.  Exercise, stretch, or perform your usual activities if these do not aggravate your pain.  Attend physical therapy sessions as directed by your caregiver.  Keep all follow-up appointments as directed by your caregiver.  Do not wear high heels or shoes that do not provide proper support.  Check your mattress to see if it is too soft. A firm mattress may lessen your pain and discomfort. SEEK IMMEDIATE MEDICAL CARE IF:   You lose control of your bowel or bladder (incontinence).  You have increasing weakness in the lower back, pelvis, buttocks,   or legs.  You have redness or swelling of your back.  You have a burning sensation when you urinate.  You have pain that gets worse when you lie down or awakens you at night.  Your pain is worse than you have experienced in the past.  Your pain is lasting longer than 4 weeks.  You are suddenly losing weight without reason. MAKE SURE YOU:  Understand these  instructions.  Will watch your condition.  Will get help right away if you are not doing well or get worse. Document Released: 11/02/2001 Document Revised: 05/09/2012 Document Reviewed: 03/19/2012 [employer] Patient Information 2015 [employer]. This information is not intended to replace advice given to you by your health care provider. Make sure you discuss any questions you have with your health care provider.  

## 2015-05-27 ENCOUNTER — Ambulatory Visit (INDEPENDENT_AMBULATORY_CARE_PROVIDER_SITE_OTHER): Payer: Medicare Other | Admitting: Family

## 2015-05-27 ENCOUNTER — Encounter: Payer: Self-pay | Admitting: Family

## 2015-05-27 VITALS — BP 170/106 | HR 72 | Temp 97.8°F | Resp 18 | Ht 69.0 in | Wt 206.8 lb

## 2015-05-27 DIAGNOSIS — I10 Essential (primary) hypertension: Secondary | ICD-10-CM | POA: Diagnosis not present

## 2015-05-27 DIAGNOSIS — M5430 Sciatica, unspecified side: Secondary | ICD-10-CM | POA: Diagnosis not present

## 2015-05-27 DIAGNOSIS — K5900 Constipation, unspecified: Secondary | ICD-10-CM | POA: Diagnosis not present

## 2015-05-27 DIAGNOSIS — K5903 Drug induced constipation: Secondary | ICD-10-CM | POA: Insufficient documentation

## 2015-05-27 HISTORY — DX: Constipation, unspecified: K59.00

## 2015-05-27 NOTE — Progress Notes (Signed)
Pre visit review using our clinic review tool, if applicable. No additional management support is needed unless otherwise documented below in the visit note. 

## 2015-05-27 NOTE — Patient Instructions (Signed)
Thank you for choosing [facility].  Summary/Instructions:  Referrals have been made during this visit. You should expect to hear back from our schedulers in about 7-10 days in regards to establishing an appointment with the specialists we discussed.   If your symptoms worsen or fail to improve, please contact our office for further instruction, or in case of emergency go directly to the emergency room at the closest medical facility.   Please continue to use the metamucil to prevent constipation.   For your back, let's get the records from previous doctors of things that you have tried.  Irritable Bowel Syndrome Irritable bowel syndrome (IBS) is caused by a disturbance of normal bowel function and is a common digestive disorder. You may also hear this condition called spastic colon, mucous colitis, and irritable colon. There is no cure for IBS. However, symptoms often gradually improve or disappear with a good diet, stress management, and medicine. This condition usually appears in late adolescence or early adulthood. Women develop it twice as often as men. CAUSES  After food has been digested and absorbed in the small intestine, waste material is moved into the large intestine, or colon. In the colon, water and salts are absorbed from the undigested products coming from the small intestine. The remaining residue, or fecal material, is held for elimination. Under normal circumstances, gentle, rhythmic contractions of the bowel walls push the fecal material along the colon toward the rectum. In IBS, however, these contractions are irregular and poorly coordinated. The fecal material is either retained too long, resulting in constipation, or expelled too soon, producing diarrhea. SIGNS AND SYMPTOMS  The most common symptom of IBS is abdominal pain. It is often in the lower left side of the abdomen, but it may occur anywhere in the abdomen. The pain comes from spasms of the bowel muscles  happening too much and from the buildup of gas and fecal material in the colon. This pain:  Can range from sharp abdominal cramps to a dull, continuous ache.  Often worsens soon after eating.  Is often relieved by having a bowel movement or passing gas. Abdominal pain is usually accompanied by constipation, but it may also produce diarrhea. The diarrhea often occurs right after a meal or upon waking up in the morning. The stools are often soft, watery, and flecked with mucus. Other symptoms of IBS include:  Bloating.  Loss of appetite.  Heartburn.  Backache.  Dull pain in the arms or shoulders.  Nausea.  Burping.  Vomiting.  Gas. IBS may also cause symptoms that are unrelated to the digestive system, such as:  Fatigue.  Headaches.  Anxiety.  Shortness of breath.  Trouble concentrating.  Dizziness. These symptoms tend to come and go. DIAGNOSIS  The symptoms of IBS may seem like symptoms of other, more serious digestive disorders. Your health care provider may want to perform tests to exclude these disorders.  TREATMENT Many medicines are available to help correct bowel function or relieve bowel spasms and abdominal pain. Among the medicines available are:  Laxatives for severe constipation and to help restore normal bowel habits.  Specific antidiarrheal medicines to treat severe or lasting diarrhea.  Antispasmodic agents to relieve intestinal cramps. Your health care provider may also decide to treat you with a mild tranquilizer or sedative during unusually stressful periods in your life. Your health care provider may also prescribe antidepressant medicine. The use of this medicine has been shown to reduce pain and other symptoms of IBS. Remember that  if any medicine is prescribed for you, you should take it exactly as directed. Make sure your health care provider knows how well it worked for you. HOME CARE INSTRUCTIONS   Take all medicines as directed by your  health care provider.  Avoid foods that are high in fat or oils, such as heavy cream, butter, frankfurters, sausage, and other fatty meats.  Avoid foods that make you go to the bathroom, such as fruit, fruit juice, and dairy products.  Cut out carbonated drinks, chewing gum, and "gassy" foods such as beans and cabbage. This may help relieve bloating and burping.  Eat foods with bran, and drink plenty of liquids with the bran foods. This helps relieve constipation.  Keep track of what foods seem to bring on your symptoms.  Avoid emotionally charged situations or circumstances that produce anxiety.  Start or continue exercising.  Get plenty of rest and sleep. Document Released: 11/08/2005 Document Revised: 11/13/2013 Document Reviewed: 06/28/2008 [facility] Patient Information 2015 [place], [place]. This information is not intended to replace advice given to you by your health care provider. Make sure you discuss any questions you have with your health care provider.

## 2015-05-27 NOTE — Progress Notes (Signed)
Subjective:    Patient ID: Patrick Roth, male    DOB: 10-Apr-1946, 69 y.o.   MRN: [id number]  Chief Complaint  Patient presents with  . Establish Care    would like a referral to GI, has issues with his colon states he gets a pain in his lower back hip area, but once he has a BM the pain goes away    HPI:  Patrick Roth is a 69 y.o. male with a PMH of hyperlipidemia, stroke and hypertension who presents today for an office visit to establish care.  1.) Colon/Back - Associated symptom of pain located around his rectum and bladder area. Notes that he previously got constipated and had rectal bleeding, which has been resolved since he started taking the metamucil. This has been going on for about 50 years since he was a teenager. Relieving factors include when he has a bowel movement. Describes the feeling as numbness and the pain specifically. Currently experiencing boweling movements up to once per day. Denies changes to bowel or bladder habits. Bowel movements are described as soft and brown. Denies abdominal pain, fevers, or chills.  2.) Hypertension - Currently maintained on terazosin. Indicates that he takes medication as prescribed and denies adverse effects. Previously maintained on bystolic, hydrochlorothiazide, and amlodipine. All of which have caused "several different kinds of problems." Takes his blood pressure at home and indicates systolic averages around 510/25 since being off the medications. Notes that he feels as he has tried several medications and believes them not to work.   BP Readings from Last 3 Encounters:  05/27/15 170/106  05/16/14 135/71  07/24/13 184/99    No Known Allergies   Outpatient Prescriptions Prior to Visit  Medication Sig Dispense Refill  . aspirin 81 MG chewable tablet Chew 81 mg by mouth daily.    . psyllium (HYDROCIL/METAMUCIL) 95 % PACK Take 1 packet by mouth daily.    Marland Kitchen atenolol (TENORMIN) 100 MG tablet Take 100 mg by mouth daily.      .  chlorthalidone (HYGROTON) 25 MG tablet Take 25 mg by mouth daily.    . cyclobenzaprine (FLEXERIL) 5 MG tablet Take 1 tablet (5 mg total) by mouth 3 (three) times daily as needed for muscle spasms. 21 tablet 0  . hydrALAZINE (APRESOLINE) 100 MG tablet Take 100 mg by mouth 2 (two) times daily.    Marland Kitchen HYDROcodone-acetaminophen (NORCO/VICODIN) 5-325 MG per tablet Take 1-2 tablets by mouth every 4 (four) hours as needed. 20 tablet 0  . nebivolol (BYSTOLIC) 10 MG tablet Take 10 mg by mouth daily.    . Red Yeast Rice Extract (RED YEAST RICE PO) Take 2 capsules by mouth daily.    . Tamsulosin HCl (FLOMAX) 0.4 MG CAPS Take 0.4 mg by mouth at bedtime.    . triamterene-hydrochlorothiazide (MAXZIDE-25) 37.5-25 MG per tablet      No facility-administered medications prior to visit.     Past Medical History  Diagnosis Date  . Hypertension   . Stroke 2008  . Hyperlipidemia   . Colon polyps      Past Surgical History  Procedure Laterality Date  . Great toe surgery       Family History  Problem Relation Age of Onset  . Hypertension Father   . Heart disease Father   . Diabetes Neg Hx   . Heart attack Neg Hx   . Hyperlipidemia Neg Hx   . Sudden death Neg Hx   . Lung cancer Mother  History   Social History  . Marital Status: Married    Spouse Name: N/A  . Number of Children: 1  . Years of Education: 12   Occupational History  . Retired    Social History Main Topics  . Smoking status: Never Smoker   . Smokeless tobacco: Never Used  . Alcohol Use: No  . Drug Use: No  . Sexual Activity: Not on file   Other Topics Concern  . Not on file   Social History Narrative   Fun: Play golf and build furniture.     Review of Systems  Constitutional: Negative for fever and chills.  Respiratory: Negative for chest tightness and shortness of breath.   Cardiovascular: Negative for chest pain, palpitations and leg swelling.  Gastrointestinal: Negative for nausea, vomiting, abdominal  pain, diarrhea, constipation and abdominal distention.  Neurological: Negative for headaches.      Objective:    BP 170/106 mmHg  Pulse 72  Temp(Src) 97.8 F (36.6 C) (Oral)  Resp 18  Ht 5\' 9"  (1.753 m)  Wt 206 lb 12.8 oz (93.804 kg)  BMI 30.53 kg/m2  SpO2 98% Nursing note and vital signs reviewed.  Physical Exam  Constitutional: He is oriented to person, place, and time. He appears well-developed and well-nourished. No distress.  Cardiovascular: Normal rate, regular rhythm, normal heart sounds and intact distal pulses.   Pulmonary/Chest: Effort normal and breath sounds normal.  Abdominal: Soft. Bowel sounds are normal. He exhibits no distension and no mass. There is no tenderness. There is no rebound and no guarding.  Musculoskeletal:  Low back with no obvious deformity, discoloration, or edema noted. Range of motion is full with no palpable discomfort. Indicates area of pain/discomfort around sacroiliac joint and lower.  Neurological: He is alert and oriented to person, place, and time.  Skin: Skin is warm and dry.  Psychiatric: He has a normal mood and affect. His behavior is normal. Judgment and thought content normal.       Assessment & Plan:   Problem List Items Addressed This Visit      Cardiovascular and Mediastinum   Essential hypertension - Primary    Hypertension remains uncontrolled with blood pressure greater than 150/90 per guidelines. Continue current dosage of terazosin. Discussed possibilities of additional medications, however patient indicates he has tried multiple medications and has had side effects with each of these medications and is very sensitive to medications. Discussed importance of protecting against endorgan damage by decreasing blood pressure. Refer to cardiology for assistance with management and blood pressure and suggestions. Continue to monitor blood pressure at home. Follow-up pending cardiology visit.      Relevant Medications   terazosin  (HYTRIN) 2 MG capsule   Other Relevant Orders   Ambulatory referral to Cardiology     Digestive   Constipation    Symptoms and exam consistent with constipation which is currently stable with Metamucil. Continue current dosage of Metamucil for bowel movements. Question slow transit time as possible cause of his constipation versus irritable bowel syndrome. Consider additional medications as needed for symptom relief if constipation reoccurs. Patient requests referral to GI. Referral placed. Follow up pending referral.       Relevant Orders   Ambulatory referral to Gastroenterology     Nervous and Auditory   Sciatica    Questionable symptoms of sciatica as patient describes numbness along the right side and occasional left. This is exacerbated by the constipation. Indicates he has tried multiple treatments. Previous x-rays reveal stool  burden and mild spondylolysis in the lumbar spine. Obtain old records to determine previous treatments. Continue conservative treatment as needed. Follow-up pending obtaining paperwork.

## 2015-05-27 NOTE — Assessment & Plan Note (Signed)
Hypertension remains uncontrolled with blood pressure greater than 150/90 per guidelines. Continue current dosage of terazosin. Discussed possibilities of additional medications, however patient indicates he has tried multiple medications and has had side effects with each of these medications and is very sensitive to medications. Discussed importance of protecting against endorgan damage by decreasing blood pressure. Refer to cardiology for assistance with management and blood pressure and suggestions. Continue to monitor blood pressure at home. Follow-up pending cardiology visit.

## 2015-05-27 NOTE — Assessment & Plan Note (Addendum)
Symptoms and exam consistent with constipation which is currently stable with Metamucil. Continue current dosage of Metamucil for bowel movements. Question slow transit time as possible cause of his constipation versus irritable bowel syndrome. Consider additional medications as needed for symptom relief if constipation reoccurs. Patient requests referral to GI. Referral placed. Follow up pending referral.

## 2015-05-27 NOTE — Assessment & Plan Note (Addendum)
Questionable symptoms of sciatica as patient describes numbness along the right side and occasional left. This is exacerbated by the constipation. Indicates he has tried multiple treatments. Previous x-rays reveal stool burden and mild spondylolysis in the lumbar spine. Obtain old records to determine previous treatments. Continue conservative treatment as needed. Follow-up pending obtaining paperwork.

## 2015-06-19 ENCOUNTER — Telehealth: Payer: Self-pay | Admitting: Family

## 2015-06-19 NOTE — Telephone Encounter (Signed)
Patient called to let you know that he has decided not to go to the cardiologist.  He stated he has already gone through all that and feels he doesn't need to do that again.

## 2015-06-23 ENCOUNTER — Ambulatory Visit: Payer: Medicare Other | Admitting: Cardiovascular Disease

## 2015-06-23 NOTE — Telephone Encounter (Signed)
Noted  

## 2015-06-27 ENCOUNTER — Encounter: Payer: Self-pay | Admitting: Internal Medicine

## 2015-06-27 ENCOUNTER — Ambulatory Visit (INDEPENDENT_AMBULATORY_CARE_PROVIDER_SITE_OTHER): Payer: Medicare Other | Admitting: Internal Medicine

## 2015-06-27 VITALS — BP 142/100 | HR 60 | Resp 55 | Ht 67.32 in | Wt 208.5 lb

## 2015-06-27 DIAGNOSIS — K5900 Constipation, unspecified: Secondary | ICD-10-CM

## 2015-06-27 DIAGNOSIS — M549 Dorsalgia, unspecified: Secondary | ICD-10-CM

## 2015-06-27 DIAGNOSIS — M25559 Pain in unspecified hip: Secondary | ICD-10-CM

## 2015-06-27 NOTE — Patient Instructions (Signed)
Please follow up as needed 

## 2015-06-27 NOTE — Progress Notes (Signed)
HISTORY OF PRESENT ILLNESS:  Patrick Roth is a 69 y.o. male who is referred by his primary care provider Mauricio Po, at the patient's request, regarding "constipation". The patient has not been seen in this office since December 2011 when he underwent routine screening colonoscopy with Dr. Verl Blalock. Examination was normal except for internal hemorrhoids. Follow-up in 10 years recommended. Patient reports to me that he has a history of constipation hemorrhoids for which daily fiber supplementation with Metamucil was recommended. Patient states that as long as he takes Metamucil his absolute no difficulties with constipation or hemorrhoids. He does use Metamucil regularly. He goes on to describe to me chronic problems with intermittent hip pain which migrates from right to left side. Associates this with playing golf on Father's Day. Apparently had a prostate biopsy last year. Subsequent had some right groin pain. Tells me that he swallowed a thumbtack and nickel as a child. Wonders if this is responsible for symptoms. He is difficult to focus. As best I can tell his entire GI review of systems is negative after careful discussion. It seems that he is wondering whether hip type complaints are somehow related to his colon. He did have an MRI of the abdomen June 2013 and May 2013 which were negative  REVIEW OF SYSTEMS:  All non-GI ROS negative except for arthritis, back pain  Past Medical History  Diagnosis Date  . Hypertension   . Stroke 2008  . Hyperlipidemia   . Colon polyps   . Sciatica   . Internal hemorrhoids     Past Surgical History  Procedure Laterality Date  . Great toe surgery    . Prostate biopsy    . Colonoscopy  2011    Social History JAKOBI THETFORD  reports that he has never smoked. He has never used smokeless tobacco. He reports that he does not drink alcohol or use illicit drugs.  family history includes Heart disease in his father; Hypertension in his father; Lung  cancer in his mother. There is no history of Diabetes, Heart attack, Hyperlipidemia, or Sudden death.  Allergies  Allergen Reactions  . Atenolol Other (See Comments)    Drowsiness and "flu sxs"  . Losartan Other (See Comments)  . Amlodipine Other (See Comments)  . Clonidine Other (See Comments)  . Hydralazine Other (See Comments)    Joint swelling  . Hydrocodone-Acetaminophen Other (See Comments)  . Levofloxacin Other (See Comments)  . Olmesartan Other (See Comments)  . Other Other (See Comments)       PHYSICAL EXAMINATION: Vital signs: BP 142/100 mmHg  Pulse 60  Resp 55  Ht 5' 7.32" (1.71 m)  Wt 208 lb 8 oz (94.575 kg)  BMI 32.34 kg/m2 General: Well-developed, well-nourished, no acute distress HEENT: Sclerae are anicteric, conjunctiva pink. Oral mucosa intact Lungs: Clear Heart: Regular Abdomen: soft, nontender, nondistended, no obvious ascites, no peritoneal signs, normal bowel sounds. No organomegaly. Extremities: No edema Psychiatric: alert and oriented x3. Cooperative   ASSESSMENT:  #1. History of constipation and hemorrhoids completely relieved with daily Metamucil #2. Colonoscopy 2011, normal #3. Complaints of hip and groin pain non-GI  PLAN:  #1. Continue Metamucil #2. Routine follow-up screening colonoscopy 2021 #3. Told that his hip and groin complaints are non-GI #4. Return to PCP  A copy of this consultation and has been sent to [facility]

## 2015-07-14 ENCOUNTER — Encounter: Payer: Self-pay | Admitting: *Deleted

## 2015-07-14 ENCOUNTER — Telehealth: Payer: Self-pay | Admitting: *Deleted

## 2015-07-14 NOTE — Telephone Encounter (Signed)
Pre-Visit Call completed with patient and chart updated.   Pre-Visit Info documented in Specialty Comments under [employer].    

## 2015-07-15 ENCOUNTER — Ambulatory Visit (INDEPENDENT_AMBULATORY_CARE_PROVIDER_SITE_OTHER): Payer: Medicare Other | Admitting: Physician Assistant

## 2015-07-15 ENCOUNTER — Encounter: Payer: Self-pay | Admitting: *Deleted

## 2015-07-15 VITALS — BP 198/108 | HR 62 | Temp 98.2°F | Resp 16 | Ht 68.25 in | Wt 208.0 lb

## 2015-07-15 DIAGNOSIS — Z9119 Patient's noncompliance with other medical treatment and regimen: Secondary | ICD-10-CM

## 2015-07-15 DIAGNOSIS — I10 Essential (primary) hypertension: Secondary | ICD-10-CM | POA: Insufficient documentation

## 2015-07-15 DIAGNOSIS — Z91199 Patient's noncompliance with other medical treatment and regimen due to unspecified reason: Secondary | ICD-10-CM

## 2015-07-15 HISTORY — DX: Essential (primary) hypertension: I10

## 2015-07-15 NOTE — Assessment & Plan Note (Signed)
Blood pressure clearly uncontrolled. Check 3 times in clinic today with lowest blood pressure measurement 190/102. States this is good for him. Denies symptoms. Discussed need to titrate anti-hypertensive regimen. Patient declines. Patient declines EKG or lab assessment. Declines ER assessment. Patient states he will titrate his medicine as he sees fit. Discussed this is AMA and I will not be responsible for supervised neglect of his hypertension. Again encouraged him to start with Maxxide daily instead of QOD and we will titrate up further based on BP response. Encouraged patient to schedule CPE ASAP. ER if symptoms develop.

## 2015-07-15 NOTE — Progress Notes (Signed)
Pre visit review using our clinic review tool, if applicable. No additional management support is needed unless otherwise documented below in the visit note/SLS  

## 2015-07-15 NOTE — Patient Instructions (Addendum)
Please reconsider letting me tweak your BP medications as they are not doing their job at current dose. If not, please follow-up with your hypertension specialist. We will have to make changes as I will not be responsible for your harmful elevated BP and the increased risk it gives you for stroke and heart attack. If you develop chest pain or SOB, please call 911.  Please limit salt intake.  Please schedule an appointment for a complete physical with fasting labs.

## 2015-07-15 NOTE — Progress Notes (Signed)
Patient presents to clinic today to establish care.  Chronic Issues: Hypertension -- patient endorses long-standing history of hypertension after suffering a stroke in 2008. Has been on multiple medication regimens previously but has had difficulty tolerating most of them. Previous medications include atenolol, losartan, amlodipine, hydralazine, clonidine, all with intolerances per patient.  Patient currently on Maxxide 37.5-25 mg. Endorses taking only 1/2 pill every other day due to "tolerance" issues. States he is seeing a hypertension specialist. Has not followed up in quite some time. Patient denies chest pain, palpitations, lightheadedness, dizziness, vision changes or frequent headaches.    BP Readings from Last 3 Encounters:  07/15/15 198/108  06/27/15 142/100  05/27/15 170/106   Is taking 81 mg ASA daily.  BPH -- Followed by Urology (Dr. Estill Dooms). Is currently on Hytrin with relief of symptoms. Recently had prostate biopsy but has not been informed of results yet.  Past Medical History  Diagnosis Date  . Hypertension   . Stroke 2008  . Hyperlipidemia   . Colon polyps   . Sciatica   . Internal hemorrhoids   . History of chicken pox     Past Surgical History  Procedure Laterality Date  . Great toe surgery    . Prostate biopsy    . Colonoscopy  2011    Current Outpatient Prescriptions on File Prior to Visit  Medication Sig Dispense Refill  . aspirin 81 MG chewable tablet Chew 81 mg by mouth daily.    . psyllium (HYDROCIL/METAMUCIL) 95 % PACK Take 1 packet by mouth daily.    Marland Kitchen terazosin (HYTRIN) 2 MG capsule Take 2 mg by mouth at bedtime.    . triamterene-hydrochlorothiazide (MAXZIDE-25) 37.5-25 MG per tablet Take 0.5 tablets by mouth every other day.   0   No current facility-administered medications on file prior to visit.    Allergies  Allergen Reactions  . Atenolol Other (See Comments)    Drowsiness and "flu sxs"  . Losartan Other (See Comments)  . Amlodipine  Other (See Comments)  . Clonidine Other (See Comments)  . Hydralazine Other (See Comments)    Joint swelling  . Hydrocodone-Acetaminophen Other (See Comments)  . Levofloxacin Other (See Comments)  . Olmesartan Other (See Comments)  . Other Other (See Comments)    Bystolic causing GI Issues    Family History  Problem Relation Age of Onset  . Hypertension Father 91    Deceased  . Heart disease Father   . Diabetes Neg Hx   . Heart attack Neg Hx   . Hyperlipidemia Neg Hx   . Sudden death Neg Hx   . Lung cancer Mother 32    Deceased  . Healthy Sister     x3  . Healthy Brother     x4  . Heart disease Brother     #5  . Other Daughter     Alpha Thalassemia  . Lupus Daughter     #2-deceased    Social History   Social History  . Marital Status: Married    Spouse Name: N/A  . Number of Children: 1  . Years of Education: 12   Occupational History  . Retired    Social History Main Topics  . Smoking status: Never Smoker   . Smokeless tobacco: Never Used  . Alcohol Use: No  . Drug Use: No  . Sexual Activity: Not on file   Other Topics Concern  . Not on file   Social History Narrative   Fun: Play golf  and build furniture.    Review of Systems  Constitutional: Negative for malaise/fatigue.  Respiratory: Negative for shortness of breath.   Cardiovascular: Negative for chest pain and palpitations.  Neurological: Negative for dizziness, loss of consciousness and headaches.   BP 198/108 mmHg  Pulse 62  Temp(Src) 98.2 F (36.8 C) (Oral)  Resp 16  Ht 5' 8.25" (1.734 m)  Wt 208 lb (94.348 kg)  BMI 31.38 kg/m2  SpO2 98%  Physical Exam  Constitutional: He is oriented to person, place, and time and well-developed, well-nourished, and in no distress.  HENT:  Head: Normocephalic and atraumatic.  Eyes: Conjunctivae are normal.  Cardiovascular: Normal rate, regular rhythm, normal heart sounds and intact distal pulses.   Pulmonary/Chest: Effort normal and breath sounds  normal. No respiratory distress. He has no wheezes. He has no rales. He exhibits no tenderness.  Neurological: He is alert and oriented to person, place, and time.  Skin: Skin is warm and dry. No rash noted.  Psychiatric: Affect normal.  Vitals reviewed.   No results found for this or any previous visit (from the past 2160 hour(s)).  Assessment/Plan: Malignant hypertension Blood pressure clearly uncontrolled. Check 3 times in clinic today with lowest blood pressure measurement 190/102. States this is good for him. Denies symptoms. Discussed need to titrate anti-hypertensive regimen. Patient declines. Patient declines EKG or lab assessment. Declines ER assessment. Patient states he will titrate his medicine as he sees fit. Discussed this is AMA and I will not be responsible for supervised neglect of his hypertension. Again encouraged him to start with Maxxide daily instead of QOD and we will titrate up further based on BP response. Encouraged patient to schedule CPE ASAP. ER if symptoms develop.

## 2015-07-23 ENCOUNTER — Encounter: Payer: Self-pay | Admitting: Physician Assistant

## 2015-07-23 ENCOUNTER — Ambulatory Visit (INDEPENDENT_AMBULATORY_CARE_PROVIDER_SITE_OTHER): Payer: Medicare Other | Admitting: Physician Assistant

## 2015-07-23 VITALS — BP 180/106 | HR 60 | Temp 98.0°F | Resp 16 | Ht 68.25 in | Wt 208.2 lb

## 2015-07-23 DIAGNOSIS — Z136 Encounter for screening for cardiovascular disorders: Secondary | ICD-10-CM

## 2015-07-23 DIAGNOSIS — I10 Essential (primary) hypertension: Secondary | ICD-10-CM | POA: Diagnosis not present

## 2015-07-23 DIAGNOSIS — Z23 Encounter for immunization: Secondary | ICD-10-CM | POA: Diagnosis not present

## 2015-07-23 DIAGNOSIS — Z0000 Encounter for general adult medical examination without abnormal findings: Secondary | ICD-10-CM

## 2015-07-23 LAB — CBC
HEMATOCRIT: 49.9 % (ref 39.0–52.0)
Hemoglobin: 16.5 g/dL (ref 13.0–17.0)
MCHC: 33 g/dL (ref 30.0–36.0)
MCV: 85.6 fl (ref 78.0–100.0)
Platelets: 138 10*3/uL — ABNORMAL LOW (ref 150.0–400.0)
RBC: 5.84 Mil/uL — ABNORMAL HIGH (ref 4.22–5.81)
RDW: 14.4 % (ref 11.5–15.5)
WBC: 4.1 10*3/uL (ref 4.0–10.5)

## 2015-07-23 LAB — URINALYSIS, ROUTINE W REFLEX MICROSCOPIC
Bilirubin Urine: NEGATIVE
Hgb urine dipstick: NEGATIVE
KETONES UR: NEGATIVE
Leukocytes, UA: NEGATIVE
Nitrite: NEGATIVE
PH: 7.5 (ref 5.0–8.0)
RBC / HPF: NONE SEEN (ref 0–?)
SPECIFIC GRAVITY, URINE: 1.01 (ref 1.000–1.030)
TOTAL PROTEIN, URINE-UPE24: NEGATIVE
URINE GLUCOSE: NEGATIVE
UROBILINOGEN UA: 0.2 (ref 0.0–1.0)

## 2015-07-23 LAB — COMPREHENSIVE METABOLIC PANEL
ALT: 21 U/L (ref 0–53)
AST: 27 U/L (ref 0–37)
Albumin: 4.1 g/dL (ref 3.5–5.2)
Alkaline Phosphatase: 73 U/L (ref 39–117)
BUN: 16 mg/dL (ref 6–23)
CHLORIDE: 100 meq/L (ref 96–112)
CO2: 32 mEq/L (ref 19–32)
Calcium: 9.4 mg/dL (ref 8.4–10.5)
Creatinine, Ser: 1.15 mg/dL (ref 0.40–1.50)
GFR: 80.75 mL/min (ref >60.00)
GLUCOSE: 90 mg/dL (ref 70–99)
POTASSIUM: 3.2 meq/L — AB (ref 3.5–5.1)
SODIUM: 138 meq/L (ref 135–145)
Total Bilirubin: 1.6 mg/dL — ABNORMAL HIGH (ref 0.2–1.2)
Total Protein: 7.6 g/dL (ref 6.0–8.3)

## 2015-07-23 LAB — HEMOGLOBIN A1C: Hgb A1c MFr Bld: 5.7 % (ref 4.6–6.5)

## 2015-07-23 LAB — LIPID PANEL
CHOL/HDL RATIO: 5
Cholesterol: 224 mg/dL — ABNORMAL HIGH (ref 0–200)
HDL: 48.9 mg/dL (ref >39.00)
LDL Cholesterol: 159 mg/dL — ABNORMAL HIGH (ref 0–99)
NONHDL: 175.44
Triglycerides: 81 mg/dL (ref 0.0–149.0)
VLDL: 16.2 mg/dL (ref 0.0–40.0)

## 2015-07-23 NOTE — Progress Notes (Signed)
Pre visit review using our clinic review tool, if applicable. No additional management support is needed unless otherwise documented below in the visit note/SLS  

## 2015-07-23 NOTE — Patient Instructions (Signed)
Please continue BP medications as directed. Increase fluids and rest. Start a Tumeric supplement to help with joint pains.  I am setting you up with one of our Cardiologists giving your history. You will likely need a repeat stress test.  Please follow-up with your specialists as directed.  Preventive Care for Adults A healthy lifestyle and preventive care can promote health and wellness. Preventive health guidelines for men include the following key practices:  A routine yearly physical is a good way to check with your health care provider about your health and preventative screening. It is a chance to share any concerns and updates on your health and to receive a thorough exam.  Visit your dentist for a routine exam and preventative care every 6 months. Brush your teeth twice a day and floss once a day. Good oral hygiene prevents tooth decay and gum disease.  The frequency of eye exams is based on your age, health, family medical history, use of contact lenses, and other factors. Follow your health care provider's recommendations for frequency of eye exams.  Eat a healthy diet. Foods such as vegetables, fruits, whole grains, low-fat dairy products, and lean protein foods contain the nutrients you need without too many calories. Decrease your intake of foods high in solid fats, added sugars, and salt. Eat the right amount of calories for you.Get information about a proper diet from your health care provider, if necessary.  Regular physical exercise is one of the most important things you can do for your health. Most adults should get at least 150 minutes of moderate-intensity exercise (any activity that increases your heart rate and causes you to sweat) each week. In addition, most adults need muscle-strengthening exercises on 2 or more days a week.  Maintain a healthy weight. The body mass index (BMI) is a screening tool to identify possible weight problems. It provides an estimate of body  fat based on height and weight. Your health care provider can find your BMI and can help you achieve or maintain a healthy weight.For adults 20 years and older:  A BMI below 18.5 is considered underweight.  A BMI of 18.5 to 24.9 is normal.  A BMI of 25 to 29.9 is considered overweight.  A BMI of 30 and above is considered obese.  Maintain normal blood lipids and cholesterol levels by exercising and minimizing your intake of saturated fat. Eat a balanced diet with plenty of fruit and vegetables. Blood tests for lipids and cholesterol should begin at age 9 and be repeated every 5 years. If your lipid or cholesterol levels are high, you are over 50, or you are at high risk for heart disease, you may need your cholesterol levels checked more frequently.Ongoing high lipid and cholesterol levels should be treated with medicines if diet and exercise are not working.  If you smoke, find out from your health care provider how to quit. If you do not use tobacco, do not start.  Lung cancer screening is recommended for adults aged 40-80 years who are at high risk for developing lung cancer because of a history of smoking. A yearly low-dose CT scan of the lungs is recommended for people who have at least a 30-pack-year history of smoking and are a current smoker or have quit within the past 15 years. A pack year of smoking is smoking an average of 1 pack of cigarettes a day for 1 year (for example: 1 pack a day for 30 years or 2 packs a day for  15 years). Yearly screening should continue until the smoker has stopped smoking for at least 15 years. Yearly screening should be stopped for people who develop a health problem that would prevent them from having lung cancer treatment.  If you choose to drink alcohol, do not have more than 2 drinks per day. One drink is considered to be 12 ounces (355 mL) of beer, 5 ounces (148 mL) of wine, or 1.5 ounces (44 mL) of liquor.  Avoid use of street drugs. Do not share  needles with anyone. Ask for help if you need support or instructions about stopping the use of drugs.  High blood pressure causes heart disease and increases the risk of stroke. Your blood pressure should be checked at least every 1-2 years. Ongoing high blood pressure should be treated with medicines, if weight loss and exercise are not effective.  If you are 4-35 years old, ask your health care provider if you should take aspirin to prevent heart disease.  Diabetes screening involves taking a blood sample to check your fasting blood sugar level. This should be done once every 3 years, after age 50, if you are within normal weight and without risk factors for diabetes. Testing should be considered at a younger age or be carried out more frequently if you are overweight and have at least 1 risk factor for diabetes.  Colorectal cancer can be detected and often prevented. Most routine colorectal cancer screening begins at the age of 42 and continues through age 36. However, your health care provider may recommend screening at an earlier age if you have risk factors for colon cancer. On a yearly basis, your health care provider may provide home test kits to check for hidden blood in the stool. Use of a small camera at the end of a tube to directly examine the colon (sigmoidoscopy or colonoscopy) can detect the earliest forms of colorectal cancer. Talk to your health care provider about this at age 101, when routine screening begins. Direct exam of the colon should be repeated every 5-10 years through age 45, unless early forms of precancerous polyps or small growths are found.  People who are at an increased risk for hepatitis B should be screened for this virus. You are considered at high risk for hepatitis B if:  You were born in a country where hepatitis B occurs often. Talk with your health care provider about which countries are considered high risk.  Your parents were born in a high-risk country  and you have not received a shot to protect against hepatitis B (hepatitis B vaccine).  You have HIV or AIDS.  You use needles to inject street drugs.  You live with, or have sex with, someone who has hepatitis B.  You are a man who has sex with other men (MSM).  You get hemodialysis treatment.  You take certain medicines for conditions such as cancer, organ transplantation, and autoimmune conditions.  Hepatitis C blood testing is recommended for all people born from [birth year] through 1965 and any individual with known risks for hepatitis C.  Practice safe sex. Use condoms and avoid high-risk sexual practices to reduce the spread of sexually transmitted infections (STIs). STIs include gonorrhea, chlamydia, syphilis, trichomonas, herpes, HPV, and human immunodeficiency virus (HIV). Herpes, HIV, and HPV are viral illnesses that have no cure. They can result in disability, cancer, and death.  If you are at risk of being infected with HIV, it is recommended that you take a prescription medicine daily  to prevent HIV infection. This is called preexposure prophylaxis (PrEP). You are considered at risk if:  You are a man who has sex with other men (MSM) and have other risk factors.  You are a heterosexual man, are sexually active, and are at increased risk for HIV infection.  You take drugs by injection.  You are sexually active with a partner who has HIV.  Talk with your health care provider about whether you are at high risk of being infected with HIV. If you choose to begin PrEP, you should first be tested for HIV. You should then be tested every 3 months for as long as you are taking PrEP.  A one-time screening for abdominal aortic aneurysm (AAA) and surgical repair of large AAAs by ultrasound are recommended for men ages 29 to 11 years who are current or former smokers.  Healthy men should no longer receive prostate-specific antigen (PSA) blood tests as part of routine cancer screening. Talk  with your health care provider about prostate cancer screening.  Testicular cancer screening is not recommended for adult males who have no symptoms. Screening includes self-exam, a health care provider exam, and other screening tests. Consult with your health care provider about any symptoms you have or any concerns you have about testicular cancer.  Use sunscreen. Apply sunscreen liberally and repeatedly throughout the day. You should seek shade when your shadow is shorter than you. Protect yourself by wearing long sleeves, pants, a wide-brimmed hat, and sunglasses year round, whenever you are outdoors.  Once a month, do a whole-body skin exam, using a mirror to look at the skin on your back. Tell your health care provider about new moles, moles that have irregular borders, moles that are larger than a pencil eraser, or moles that have changed in shape or color.  Stay current with required vaccines (immunizations).  Influenza vaccine. All adults should be immunized every year.  Tetanus, diphtheria, and acellular pertussis (Td, Tdap) vaccine. An adult who has not previously received Tdap or who does not know his vaccine status should receive 1 dose of Tdap. This initial dose should be followed by tetanus and diphtheria toxoids (Td) booster doses every 10 years. Adults with an unknown or incomplete history of completing a 3-dose immunization series with Td-containing vaccines should begin or complete a primary immunization series including a Tdap dose. Adults should receive a Td booster every 10 years.  Varicella vaccine. An adult without evidence of immunity to varicella should receive 2 doses or a second dose if he has previously received 1 dose.  Human papillomavirus (HPV) vaccine. Males aged 33-21 years who have not received the vaccine previously should receive the 3-dose series. Males aged 22-26 years may be immunized. Immunization is recommended through the age of 53 years for any male who has  sex with males and did not get any or all doses earlier. Immunization is recommended for any person with an immunocompromised condition through the age of 45 years if he did not get any or all doses earlier. During the 3-dose series, the second dose should be obtained 4-8 weeks after the first dose. The third dose should be obtained 24 weeks after the first dose and 16 weeks after the second dose.  Zoster vaccine. One dose is recommended for adults aged 2 years or older unless certain conditions are present.  Measles, mumps, and rubella (MMR) vaccine. Adults born before [birth year] generally are considered immune to measles and mumps. Adults born in [birth year] or later should  have 1 or more doses of MMR vaccine unless there is a contraindication to the vaccine or there is laboratory evidence of immunity to each of the three diseases. A routine second dose of MMR vaccine should be obtained at least 28 days after the first dose for students attending postsecondary schools, health care workers, or international travelers. People who received inactivated measles vaccine or an unknown type of measles vaccine during 1963-1967 should receive 2 doses of MMR vaccine. People who received inactivated mumps vaccine or an unknown type of mumps vaccine before 1979 and are at high risk for mumps infection should consider immunization with 2 doses of MMR vaccine. Unvaccinated health care workers born before [birth year] who lack laboratory evidence of measles, mumps, or rubella immunity or laboratory confirmation of disease should consider measles and mumps immunization with 2 doses of MMR vaccine or rubella immunization with 1 dose of MMR vaccine.  Pneumococcal 13-valent conjugate (PCV13) vaccine. When indicated, a person who is uncertain of his immunization history and has no record of immunization should receive the PCV13 vaccine. An adult aged 9 years or older who has certain medical conditions and has not been previously immunized should  receive 1 dose of PCV13 vaccine. This PCV13 should be followed with a dose of pneumococcal polysaccharide (PPSV23) vaccine. The PPSV23 vaccine dose should be obtained at least 8 weeks after the dose of PCV13 vaccine. An adult aged 10 years or older who has certain medical conditions and previously received 1 or more doses of PPSV23 vaccine should receive 1 dose of PCV13. The PCV13 vaccine dose should be obtained 1 or more years after the last PPSV23 vaccine dose.  Pneumococcal polysaccharide (PPSV23) vaccine. When PCV13 is also indicated, PCV13 should be obtained first. All adults aged 7 years and older should be immunized. An adult younger than age 55 years who has certain medical conditions should be immunized. Any person who resides in a nursing home or long-term care facility should be immunized. An adult smoker should be immunized. People with an immunocompromised condition and certain other conditions should receive both PCV13 and PPSV23 vaccines. People with human immunodeficiency virus (HIV) infection should be immunized as soon as possible after diagnosis. Immunization during chemotherapy or radiation therapy should be avoided. Routine use of PPSV23 vaccine is not recommended for American Indians, [place] Natives, or people younger than 65 years unless there are medical conditions that require PPSV23 vaccine. When indicated, people who have unknown immunization and have no record of immunization should receive PPSV23 vaccine. One-time revaccination 5 years after the first dose of PPSV23 is recommended for people aged 19-64 years who have chronic kidney failure, nephrotic syndrome, asplenia, or immunocompromised conditions. People who received 1-2 doses of PPSV23 before age 76 years should receive another dose of PPSV23 vaccine at age 72 years or later if at least 5 years have passed since the previous dose. Doses of PPSV23 are not needed for people immunized with PPSV23 at or after age 15  years.  Meningococcal vaccine. Adults with asplenia or persistent complement component deficiencies should receive 2 doses of quadrivalent meningococcal conjugate (MenACWY-D) vaccine. The doses should be obtained at least 2 months apart. Microbiologists working with certain meningococcal bacteria, [employer] recruits, people at risk during an outbreak, and people who travel to or live in countries with a high rate of meningitis should be immunized. A first-year college student up through age 8 years who is living in a residence hall should receive a dose if he did not receive  a dose on or after his 16th birthday. Adults who have certain high-risk conditions should receive one or more doses of vaccine.  Hepatitis A vaccine. Adults who wish to be protected from this disease, have certain high-risk conditions, work with hepatitis A-infected animals, work in hepatitis A research labs, or travel to or work in countries with a high rate of hepatitis A should be immunized. Adults who were previously unvaccinated and who anticipate close contact with an international adoptee during the first 60 days after arrival in the [place] from a country with a high rate of hepatitis A should be immunized.  Hepatitis B vaccine. Adults should be immunized if they wish to be protected from this disease, have certain high-risk conditions, may be exposed to blood or other infectious body fluids, are household contacts or sex partners of hepatitis B positive people, are clients or workers in certain care facilities, or travel to or work in countries with a high rate of hepatitis B.  Haemophilus influenzae type b (Hib) vaccine. A previously unvaccinated person with asplenia or sickle cell disease or having a scheduled splenectomy should receive 1 dose of Hib vaccine. Regardless of previous immunization, a recipient of a hematopoietic stem cell transplant should receive a 3-dose series 6-12 months after his successful transplant.  Hib vaccine is not recommended for adults with HIV infection. Preventive Service / Frequency Ages 28 to 38  Blood pressure check.** / Every 1 to 2 years.  Lipid and cholesterol check.** / Every 5 years beginning at age 72.  Hepatitis C blood test.** / For any individual with known risks for hepatitis C.  Skin self-exam. / Monthly.  Influenza vaccine. / Every year.  Tetanus, diphtheria, and acellular pertussis (Tdap, Td) vaccine.** / Consult your health care provider. 1 dose of Td every 10 years.  Varicella vaccine.** / Consult your health care provider.  HPV vaccine. / 3 doses over 6 months, if 23 or younger.  Measles, mumps, rubella (MMR) vaccine.** / You need at least 1 dose of MMR if you were born in 1957 or later. You may also need a second dose.  Pneumococcal 13-valent conjugate (PCV13) vaccine.** / Consult your health care provider.  Pneumococcal polysaccharide (PPSV23) vaccine.** / 1 to 2 doses if you smoke cigarettes or if you have certain conditions.  Meningococcal vaccine.** / 1 dose if you are age 46 to 79 years and a Market researcher living in a residence hall, or have one of several medical conditions. You may also need additional booster doses.  Hepatitis A vaccine.** / Consult your health care provider.  Hepatitis B vaccine.** / Consult your health care provider.  Haemophilus influenzae type b (Hib) vaccine.** / Consult your health care provider. Ages 61 to 19  Blood pressure check.** / Every 1 to 2 years.  Lipid and cholesterol check.** / Every 5 years beginning at age 68.  Lung cancer screening. / Every year if you are aged 32-80 years and have a 30-pack-year history of smoking and currently smoke or have quit within the past 15 years. Yearly screening is stopped once you have quit smoking for at least 15 years or develop a health problem that would prevent you from having lung cancer treatment.  Fecal occult blood test (FOBT) of stool. / Every year  beginning at age 64 and continuing until age 5. You may not have to do this test if you get a colonoscopy every 10 years.  Flexible sigmoidoscopy** or colonoscopy.** / Every 5 years for a  flexible sigmoidoscopy or every 10 years for a colonoscopy beginning at age 47 and continuing until age 26.  Hepatitis C blood test.** / For all people born from [birth year] through 1966 and any individual with known risks for hepatitis C.  Skin self-exam. / Monthly.  Influenza vaccine. / Every year.  Tetanus, diphtheria, and acellular pertussis (Tdap/Td) vaccine.** / Consult your health care provider. 1 dose of Td every 10 years.  Varicella vaccine.** / Consult your health care provider.  Zoster vaccine.** / 1 dose for adults aged 28 years or older.  Measles, mumps, rubella (MMR) vaccine.** / You need at least 1 dose of MMR if you were born in 1957 or later. You may also need a second dose.  Pneumococcal 13-valent conjugate (PCV13) vaccine.** / Consult your health care provider.  Pneumococcal polysaccharide (PPSV23) vaccine.** / 1 to 2 doses if you smoke cigarettes or if you have certain conditions.  Meningococcal vaccine.** / Consult your health care provider.  Hepatitis A vaccine.** / Consult your health care provider.  Hepatitis B vaccine.** / Consult your health care provider.  Haemophilus influenzae type b (Hib) vaccine.** / Consult your health care provider. Ages 39 and over  Blood pressure check.** / Every 1 to 2 years.  Lipid and cholesterol check.**/ Every 5 years beginning at age 79.  Lung cancer screening. / Every year if you are aged 71-80 years and have a 30-pack-year history of smoking and currently smoke or have quit within the past 15 years. Yearly screening is stopped once you have quit smoking for at least 15 years or develop a health problem that would prevent you from having lung cancer treatment.  Fecal occult blood test (FOBT) of stool. / Every year beginning at age 7 and  continuing until age 52. You may not have to do this test if you get a colonoscopy every 10 years.  Flexible sigmoidoscopy** or colonoscopy.** / Every 5 years for a flexible sigmoidoscopy or every 10 years for a colonoscopy beginning at age 59 and continuing until age 36.  Hepatitis C blood test.** / For all people born from [birth year] through 1966 and any individual with known risks for hepatitis C.  Abdominal aortic aneurysm (AAA) screening.** / A one-time screening for ages 62 to 65 years who are current or former smokers.  Skin self-exam. / Monthly.  Influenza vaccine. / Every year.  Tetanus, diphtheria, and acellular pertussis (Tdap/Td) vaccine.** / 1 dose of Td every 10 years.  Varicella vaccine.** / Consult your health care provider.  Zoster vaccine.** / 1 dose for adults aged 62 years or older.  Pneumococcal 13-valent conjugate (PCV13) vaccine.** / Consult your health care provider.  Pneumococcal polysaccharide (PPSV23) vaccine.** / 1 dose for all adults aged 69 years and older.  Meningococcal vaccine.** / Consult your health care provider.  Hepatitis A vaccine.** / Consult your health care provider.  Hepatitis B vaccine.** / Consult your health care provider.  Haemophilus influenzae type b (Hib) vaccine.** / Consult your health care provider. **Family history and personal history of risk and conditions may change your health care provider's recommendations. Document Released: 01/04/2002 Document Revised: 11/13/2013 Document Reviewed: 04/05/2011 [facility] Patient Information 2015 [place], [place]. This information is not intended to replace advice given to you by your health care provider. Make sure you discuss any questions you have with your health care provider.

## 2015-07-23 NOTE — Progress Notes (Signed)
Subjective:    Patrick Roth is a 69 y.o. male who presents for Medicare Annual/Subsequent preventive examination.   Preventive Screening-Counseling & Management  Tobacco History  Smoking status  . Never Smoker   Smokeless tobacco  . Never Used    Problems Prior to Visit 1. Malignant Hypertension -- Has increased Maxxide to 1 tablet daily instead of 1/2 tablet every other day. Has noted BP improving at home, still in 180 range for SBP. Patient denies chest pain, palpitations, lightheadedness, dizziness, vision changes or frequent headaches. Endorses arthralgias since increasing medication. Has not followed up with HTN specialist as directed.  Current Problems (verified) Patient Active Problem List   Diagnosis Date Noted  . Malignant hypertension 07/15/2015  . Essential hypertension 05/27/2015  . Constipation 05/27/2015  . Sciatica 05/27/2015  . Mallet deformity of fourth finger, right 07/11/2013    Medications Prior to Visit Current Outpatient Prescriptions on File Prior to Visit  Medication Sig Dispense Refill  . aspirin 81 MG chewable tablet Chew 81 mg by mouth daily.    . psyllium (HYDROCIL/METAMUCIL) 95 % PACK Take 1 packet by mouth daily.    Marland Kitchen terazosin (HYTRIN) 2 MG capsule Take 2 mg by mouth at bedtime.    . triamterene-hydrochlorothiazide (MAXZIDE-25) 37.5-25 MG per tablet Take 1 tablet by mouth daily.   0   No current facility-administered medications on file prior to visit.    Current Medications (verified) Current Outpatient Prescriptions  Medication Sig Dispense Refill  . aspirin 81 MG chewable tablet Chew 81 mg by mouth daily.    . naproxen sodium (ANAPROX) 220 MG tablet Take 220 mg by mouth 2 (two) times daily with a meal.    . psyllium (HYDROCIL/METAMUCIL) 95 % PACK Take 1 packet by mouth daily.    Marland Kitchen terazosin (HYTRIN) 2 MG capsule Take 2 mg by mouth at bedtime.    . triamterene-hydrochlorothiazide (MAXZIDE-25) 37.5-25 MG per tablet Take 1 tablet by mouth  daily.   0   No current facility-administered medications for this visit.     Allergies (verified) Atenolol; Losartan; Amlodipine; Clonidine; Hydralazine; Hydrocodone-acetaminophen; Levofloxacin; Olmesartan; and Other   PAST HISTORY  Family History Family History  Problem Relation Age of Onset  . Hypertension Father 57    Deceased  . Heart disease Father   . Diabetes Neg Hx   . Heart attack Neg Hx   . Hyperlipidemia Neg Hx   . Sudden death Neg Hx   . Lung cancer Mother 40    Deceased  . Healthy Sister     x3  . Healthy Brother     x4  . Heart disease Brother     #5  . Other Daughter     Alpha Thalassemia  . Lupus Daughter     #2-deceased   Social History Social History  Substance Use Topics  . Smoking status: Never Smoker   . Smokeless tobacco: Never Used  . Alcohol Use: No   Are there smokers in your home (other than you)?  No  Risk Factors Current exercise habits: Home exercise routine includes stretching and walking 1 hrs per day.  Dietary issues discussed: well-balanced diet. Limit salt intake.   Cardiac risk factors: advanced age (older than 32 for men, 50 for women), dyslipidemia, family history of premature cardiovascular disease, hypertension and male gender.  Depression Screen (Note: if answer to either of the following is "Yes", a more complete depression screening is indicated)   Q1: Over the past two weeks, have  you felt down, depressed or hopeless? No  Q2: Over the past two weeks, have you felt little interest or pleasure in doing things? No  Have you lost interest or pleasure in daily life? No  Do you often feel hopeless? No  Do you cry easily over simple problems? No  Activities of Daily Living In your present state of health, do you have any difficulty performing the following activities?:  Driving? No Managing money?  No Feeding yourself? No Getting from bed to chair? No Climbing a flight of stairs? No Preparing food and eating?:  No Bathing or showering? No Getting dressed: No Getting to the toilet? No Using the toilet:No Moving around from place to place: No In the past year have you fallen or had a near fall?:Yes   Are you sexually active?  No  Do you have more than one partner?  N/A  Hearing Difficulties: No Do you often ask people to speak up or repeat themselves? No Do you experience ringing or noises in your ears? Yes Do you have difficulty understanding soft or whispered voices? No   Do you feel that you have a problem with memory? No  Do you often misplace items? No  Do you feel safe at home?  Yes  Cognitive Testing  Alert? Yes  Normal Appearance?Yes  Oriented to person? Yes  Place? Yes   Time? Yes  Recall of three objects?  Yes  Can perform simple calculations? Yes  Displays appropriate judgment?Yes  Can read the correct time from a watch face?Yes   Advanced Directives have been discussed with the patient? No   List the Names of Other Physician/Practitioners you currently use: 1.  Dr. Estill Dooms -- Urology 2.  Dr. Vonzell Schlatter (sp?) -- Hypertensive Specialist [facility])  Indicate any recent Medical Services you may have received from other than [facility] providers in the past year (date may be approximate).  Immunization History  Administered Date(s) Administered  . Tdap 07/05/2013    Screening Tests Health Maintenance  Topic Date Due  . Hepatitis C Screening  [birth year]  . ZOSTAVAX  02/03/2005  . PNA vac Low Risk Adult (1 of 2 - PCV13) 02/03/2010  . INFLUENZA VACCINE  06/23/2015  . COLONOSCOPY  10/26/2020  . TETANUS/TDAP  07/06/2023    All answers were reviewed with the patient and necessary referrals were made:  Leeanne Rio, PA-C   07/23/2015   History reviewed: allergies, current medications, past family history, past medical history, past social history, past surgical history and problem list  Review of Systems A comprehensive review of systems was negative.    Objective:      Vision by Snellen chart: right eye:20/20, left eye:20/20 Blood pressure 184/102, pulse 60, temperature 98 F (36.7 C), temperature source Oral, resp. rate 16, height 5' 8.25" (1.734 m), weight 208 lb 4 oz (94.462 kg), SpO2 98 %. Body mass index is 31.42 kg/(m^2).  General appearance: alert, cooperative, appears stated age and no distress Head: Normocephalic, without obvious abnormality, atraumatic Eyes: conjunctivae/corneas clear. PERRL, EOM's intact. Fundi benign. Ears: normal TM's and external ear canals both ears Nose: Nares normal. Septum midline. Mucosa normal. No drainage or sinus tenderness. Throat: lips, mucosa, and tongue normal; teeth and gums normal Lungs: clear to auscultation bilaterally Heart: regular rate and rhythm, S1, S2 normal, no murmur, click, rub or gallop Abdomen: soft, non-tender; bowel sounds normal; no masses,  no organomegaly Extremities: extremities normal, atraumatic, no cyanosis or edema Pulses: 2+ and symmetric Skin: Skin color, texture,  turgor normal. No rashes or lesions Neurologic: Alert and oriented X 3, normal strength and tone. Normal symmetric reflexes. Normal coordination and gait     Assessment:     (1) Medicare Wellness, Subsequent (2) Annual Physical (3) Malignant HTN (4) BPH     Plan:     (1) During the course of the visit the patient was educated and counseled about appropriate screening and preventive services including:    Pneumococcal vaccine   Screening electrocardiogram  Prostate cancer screening  Diabetes screening  Nutrition counseling   Advanced directives: has NO advanced directive - not interested in additional information  Diet review for nutrition referral? Yes ____  Not Indicated __x__  Declines Zostavax, Penumonia vaccination or flu shot.  Denies Hepatitis screening  (2) EKG with PACs, asymptomatic. Giving history and noncompliance with BP, will refer to Cardiology/HTN specialist here in the area as his  current specialist is too far away. Will obtain fasting labs today.  (3) Uncontrolled although improving. Patient refuses to let me titrate meds further. Is "allergic/intolerant" to numerous anti-hypertensive regimens but has atypical reactions. Do not think his symptoms are stemming from medications. Encouraged OTC supplement for joint health. Continue BP medication daily. Referral placed to Cardio.  (4) Followed by Urology who is monitoring PSA.   Patient Instructions (the written plan) was given to the patient.  Medicare Attestation I have personally reviewed: The patient's medical and social history Their use of alcohol, tobacco or illicit drugs Their current medications and supplements The patient's functional ability including ADLs,fall risks, home safety risks, cognitive, and hearing and visual impairment Diet and physical activities Evidence for depression or mood disorders  The patient's weight, height, BMI, and visual acuity have been recorded in the chart.  I have made referrals, counseling, and provided education to the patient based on review of the above and I have provided the patient with a written personalized care plan for preventive services.     Raiford Noble E[place], [place]   07/23/2015

## 2015-07-25 ENCOUNTER — Telehealth: Payer: Self-pay | Admitting: *Deleted

## 2015-07-25 MED ORDER — PRAVASTATIN SODIUM 20 MG PO TABS: 20.0000 mg | ORAL_TABLET | Freq: Every day | ORAL | Status: DC

## 2015-07-25 MED ORDER — POTASSIUM CHLORIDE CRYS ER 20 MEQ PO TBCR
20.0000 meq | EXTENDED_RELEASE_TABLET | Freq: Every day | ORAL | Status: DC
Start: 2015-07-25 — End: 2015-07-25

## 2015-07-25 MED ORDER — POTASSIUM CHLORIDE CRYS ER 20 MEQ PO TBCR: 20.0000 meq | EXTENDED_RELEASE_TABLET | Freq: Two times a day (BID) | ORAL | Status: DC

## 2015-07-25 NOTE — Telephone Encounter (Signed)
Patient informed, understood & agreed, new Rx to pharmacy & F/U lab appt scheduled for Fri, 09.16.16 at 8:45am/SLS

## 2015-07-25 NOTE — Telephone Encounter (Signed)
-----   Message from Brunetta Jeans, PA-C sent at 07/24/2015  7:39 PM EDT ----- Labs good overall. Cholesterol elevated with Total Cholesterol at 224 and LDL at 159. Giving blood pressure issues and hx of stroke, need to start medication. Recommend Pravastatin 20 mg daily.  Also potassium low due to BP medication.  Need to start a potassium supplement at 20 mEq twice daily.  Ok to send in Rx both with quantity 30 and 3 refills. Follow-up in lab for repeat BMP in 2 weeks after starting potassium supplement.

## 2015-08-05 ENCOUNTER — Ambulatory Visit (INDEPENDENT_AMBULATORY_CARE_PROVIDER_SITE_OTHER): Payer: Medicare Other | Admitting: Cardiovascular Disease

## 2015-08-05 ENCOUNTER — Encounter: Payer: Self-pay | Admitting: Cardiovascular Disease

## 2015-08-05 VITALS — BP 180/100 | HR 96 | Ht 68.25 in | Wt 208.0 lb

## 2015-08-05 DIAGNOSIS — I10 Essential (primary) hypertension: Secondary | ICD-10-CM | POA: Diagnosis not present

## 2015-08-05 MED ORDER — SPIRONOLACTONE 25 MG PO TABS: 25.0000 mg | ORAL_TABLET | Freq: Every day | ORAL | Status: DC

## 2015-08-05 NOTE — Patient Instructions (Addendum)
Medication Instructions:  STOP Triamterene START Spironolactone 25 mg once daily   Labwork: Return to our office TOMORROW MORNING for lab work - basic metabolic panel, TSH, Cortisol, Aldosterone level  Your physician recommends that you return for lab work in: 1 week - basic metabolic panel    Testing/Procedures: None Ordered   Follow-Up: Your physician recommends that you schedule a follow-up appointment in: 4-6 weeks with Dr. Acie Fredrickson.

## 2015-08-05 NOTE — Progress Notes (Signed)
Cardiology Office Note   Date:  08/05/2015   ID:  Patrick Roth, DOB [birth year], MRN [id number]  PCP:  Leeanne Rio, PA-C  Cardiologist:   Acie Fredrickson Wonda Cheng, MD   Chief Complaint  Patient presents with  . Hypertension  . Shortness of Breath   Problem List 1. CVA - 2008  2. Dyspnea 3. Essential Hypertension 4. Hyperlipidemia    History of Present Illness: Patrick Roth is a 69 y.o. male who presents for some dyspnea  He has had HTN for years.  Recently has had some dyspnea - thinks its related to his BP meds.   Has tried multiple BP meds .  Intolerant to multiple meds.   Has seen Dr. Johnsie Cancel in the past.  Eats a low salt diet - for years.  Formerly worked as a Games developer .     Past Medical History  Diagnosis Date  . Hypertension   . Stroke 2008  . Hyperlipidemia   . Colon polyps   . Sciatica   . Internal hemorrhoids   . History of chicken pox     Past Surgical History  Procedure Laterality Date  . Great toe surgery    . Prostate biopsy    . Colonoscopy  2011     Current Outpatient Prescriptions  Medication Sig Dispense Refill  . aspirin 81 MG chewable tablet Chew 81 mg by mouth daily.    . naproxen sodium (ANAPROX) 220 MG tablet Take 220 mg by mouth 2 (two) times daily with a meal.    . potassium chloride SA (K-DUR,KLOR-CON) 20 MEQ tablet Take 1 tablet (20 mEq total) by mouth 2 (two) times daily. 60 tablet 3  . pravastatin (PRAVACHOL) 20 MG tablet Take 1 tablet (20 mg total) by mouth daily. 30 tablet 3  . psyllium (HYDROCIL/METAMUCIL) 95 % PACK Take 1 packet by mouth daily.    Marland Kitchen terazosin (HYTRIN) 2 MG capsule Take 2 mg by mouth every evening.     . triamterene-hydrochlorothiazide (MAXZIDE-25) 37.5-25 MG per tablet Take 1 tablet by mouth daily.   0   No current facility-administered medications for this visit.    Allergies:   Atenolol; Losartan; Amlodipine; Clonidine; Hydralazine; Hydrocodone-acetaminophen; Levofloxacin; Olmesartan; and  Other    Social History:  The patient  reports that he has never smoked. He has never used smokeless tobacco. He reports that he does not drink alcohol or use illicit drugs.   Family History:  The patient's family history includes Healthy in his brother and sister; Heart disease in his brother and father; Hypertension (age of onset: 58) in his father; Lung cancer (age of onset: 12) in his mother; Lupus in his daughter; Other in his daughter. There is no history of Diabetes, Heart attack, Hyperlipidemia, or Sudden death.    ROS:  Please see the history of present illness.    Review of Systems: Constitutional:  denies fever, chills, diaphoresis, appetite change and fatigue.  HEENT: denies photophobia, eye pain, redness, hearing loss, ear pain, congestion, sore throat, rhinorrhea, sneezing, neck pain, neck stiffness and tinnitus.  Respiratory: denies SOB, DOE, cough, chest tightness, and wheezing.  Cardiovascular: denies chest pain, palpitations and leg swelling.  Gastrointestinal: denies nausea, vomiting, abdominal pain, diarrhea, constipation, blood in stool.  Genitourinary: denies dysuria, urgency, frequency, hematuria, flank pain and difficulty urinating.  Musculoskeletal: denies  myalgias, back pain, joint swelling, arthralgias and gait problem.   Skin: denies pallor, rash and wound.  Neurological: denies dizziness, seizures, syncope,  weakness, light-headedness, numbness and headaches.   Hematological: denies adenopathy, easy bruising, personal or family bleeding history.  Psychiatric/ Behavioral: denies suicidal ideation, mood changes, confusion, nervousness, sleep disturbance and agitation.       All other systems are reviewed and negative.    PHYSICAL EXAM: VS:  BP 180/100 mmHg  Pulse 66  Ht 5' 8.25" (1.734 m)  Wt 94.348 kg (208 lb)  BMI 31.38 kg/m2 , BMI Body mass index is 31.38 kg/(m^2). GEN: Well nourished, well developed, in no acute distress HEENT: normal Neck: no JVD,  carotid bruits, or masses Cardiac: RRR with occasional premature beats ; no murmurs, rubs, or gallops,no edema  Respiratory:  clear to auscultation bilaterally, normal work of breathing GI: soft, nontender, nondistended, + BS MS: no deformity or atrophy Skin: warm and dry, no rash Neuro:  Strength and sensation are intact Psych: normal   EKG:  EKG is ordered today. The ekg ordered 8/31  demonstrates NSR , RBBB, frequent PACs    Recent Labs: 07/23/2015: ALT 21; BUN 16; Creatinine, Ser 1.15; Hemoglobin 16.5; Platelets 138.0*; Potassium 3.2*; Sodium 138    Lipid Panel    Component Value Date/Time   CHOL 224* 07/23/2015 0915   TRIG 81.0 07/23/2015 0915   HDL 48.90 07/23/2015 0915   CHOLHDL 5 07/23/2015 0915   VLDL 16.2 07/23/2015 0915   LDLCALC 159* 07/23/2015 0915      Wt Readings from Last 3 Encounters:  08/05/15 94.348 kg (208 lb)  07/23/15 94.462 kg (208 lb 4 oz)  07/15/15 94.348 kg (208 lb)      Other studies Reviewed: Additional studies/ records that were reviewed today include: . Review of the above records demonstrates:    ASSESSMENT AND PLAN:  1.  Hypertension: The patient presents with significant hypertension. He's tried multiple medications and has numerous side effects. He has a surprisingly low potassium level despite being on triamterene. It's possible that he has hyperaldosteronism. We'll check a renin and Aldosterone  level today. Will also check a BMP.   Will DC the triamterine and start him on Aldactone 25 mg a day .  BMP in 1 week.  Current medicines are reviewed at length with the patient today.  The patient does not have concerns regarding medicines.  The following changes have been made:  no change  Labs/ tests ordered today include:  No orders of the defined types were placed in this encounter.     Disposition:   FU with me in 4-6 weeks .    Vayda Dungee, Wonda Cheng, MD  08/05/2015 4:06 PM    [facility] [place], [place], [place]  [address] Phone: [phone]; Fax: [phone]   [facility]  [address] [place], [place]  [address] [phone]   Fax [phone]

## 2015-08-06 ENCOUNTER — Other Ambulatory Visit: Payer: Self-pay | Admitting: Nurse Practitioner

## 2015-08-06 ENCOUNTER — Other Ambulatory Visit (INDEPENDENT_AMBULATORY_CARE_PROVIDER_SITE_OTHER): Payer: Medicare Other | Admitting: *Deleted

## 2015-08-06 DIAGNOSIS — I10 Essential (primary) hypertension: Secondary | ICD-10-CM | POA: Diagnosis not present

## 2015-08-06 LAB — BASIC METABOLIC PANEL
BUN: 15 mg/dL (ref 6–23)
CALCIUM: 9.5 mg/dL (ref 8.4–10.5)
CHLORIDE: 100 meq/L (ref 96–112)
CO2: 27 meq/L (ref 19–32)
Creatinine, Ser: 1.31 mg/dL (ref 0.40–1.50)
GFR: 69.47 mL/min (ref >60.00)
Glucose, Bld: 119 mg/dL — ABNORMAL HIGH (ref 70–99)
Potassium: 3.3 mEq/L — ABNORMAL LOW (ref 3.5–5.1)
SODIUM: 139 meq/L (ref 135–145)

## 2015-08-06 LAB — TSH: TSH: 0.89 u[IU]/mL (ref 0.35–4.50)

## 2015-08-06 LAB — CORTISOL: Cortisol, Plasma: 10.8 ug/dL

## 2015-08-06 NOTE — Addendum Note (Signed)
Addended by: Eulis Foster on: 08/06/2015 09:04 AM   Modules accepted: Orders

## 2015-08-08 ENCOUNTER — Telehealth: Payer: Self-pay | Admitting: Cardiovascular Disease

## 2015-08-08 ENCOUNTER — Other Ambulatory Visit: Payer: Medicare Other

## 2015-08-08 NOTE — Telephone Encounter (Signed)
-----   Message from Thayer Headings, MD sent at 08/06/2015  5:26 PM EDT ----- Labs are stable. Potassium is low ( as expected )  We have started him on Aldactone

## 2015-08-08 NOTE — Telephone Encounter (Signed)
New message ° ° ° ° °Pt calling in regards to lab results °

## 2015-08-08 NOTE — Telephone Encounter (Signed)
Advised patient of lab results  

## 2015-08-10 LAB — ALDOSTERONE + RENIN ACTIVITY W/ RATIO
ALDO / PRA Ratio: 42.9 Ratio — ABNORMAL HIGH (ref 0.9–28.9)
Aldosterone: 6 ng/dL
PRA LC/MS/MS: 0.14 ng/mL/h — AB (ref 0.25–5.82)

## 2015-08-12 ENCOUNTER — Telehealth: Payer: Self-pay | Admitting: Cardiovascular Disease

## 2015-08-12 DIAGNOSIS — I10 Essential (primary) hypertension: Secondary | ICD-10-CM

## 2015-08-12 NOTE — Telephone Encounter (Signed)
New Message      Pt called stating he received a vm yesterday to call back for lab results. There is a note in Epic stating that pt received lab results on 08/08/15 from [place], but pt is adamant that someone called yesterday to give him more results. Please call back and advise.

## 2015-08-12 NOTE — Telephone Encounter (Signed)
Notified patient of Dr. Elmarie Shiley comments on his most recent lab results and the recommendation he repeat BMET in one week, come in for BP check in one week, and verify that patient was taking new Rx of Aldactone.  Patient verbalized understanding and agreement. Provided education to patient regarding Hyperaldosteronism. Patient verbalized appreciation for information.

## 2015-08-13 ENCOUNTER — Other Ambulatory Visit: Payer: Medicare Other

## 2015-08-13 ENCOUNTER — Telehealth: Payer: Self-pay | Admitting: Nurse Practitioner

## 2015-08-13 NOTE — Telephone Encounter (Signed)
Spoke with patient who was scheduled by another nurse for lab work and BP check tomorrow 9/22.  I called and advised patient that the appointment for tomorrow is too soon to evaluate effectiveness of medication therapy.  I scheduled patient's appointments for BP check with nurse and repeat bmet for Monday 9/26.  He states he is currently taking 12.5 mg of the aldactone due to side effects of "foggy" vision, feeling bad after starting the medication at 25 mg.  I advised him to try to take the other 1/2 tab (12.5 mg) later in the day.  He states he tried that but gets up every 1.5 hours during the night to urinate even when he takes the aldactone before 4 pm.  I advised him to continue to monitor and if unable to tolerate full dose, to continue 1/2 dose but not to stop the medication.  He verbalized understanding and agreement with plan of care.

## 2015-08-14 ENCOUNTER — Ambulatory Visit: Payer: Medicare Other | Admitting: Pharmacist

## 2015-08-14 ENCOUNTER — Other Ambulatory Visit: Payer: Medicare Other

## 2015-08-18 ENCOUNTER — Telehealth: Payer: Self-pay | Admitting: Nurse Practitioner

## 2015-08-18 ENCOUNTER — Encounter: Payer: Self-pay | Admitting: *Deleted

## 2015-08-18 ENCOUNTER — Other Ambulatory Visit (INDEPENDENT_AMBULATORY_CARE_PROVIDER_SITE_OTHER): Payer: Medicare Other | Admitting: *Deleted

## 2015-08-18 ENCOUNTER — Ambulatory Visit (INDEPENDENT_AMBULATORY_CARE_PROVIDER_SITE_OTHER): Payer: Medicare Other | Admitting: *Deleted

## 2015-08-18 VITALS — BP 180/106 | HR 64 | Wt 210.0 lb

## 2015-08-18 DIAGNOSIS — I10 Essential (primary) hypertension: Secondary | ICD-10-CM

## 2015-08-18 LAB — BASIC METABOLIC PANEL
BUN: 13 mg/dL (ref 6–23)
CALCIUM: 9.3 mg/dL (ref 8.4–10.5)
CO2: 27 mEq/L (ref 19–32)
Chloride: 104 mEq/L (ref 96–112)
Creatinine, Ser: 1.22 mg/dL (ref 0.40–1.50)
GFR: 75.41 mL/min (ref >60.00)
Glucose, Bld: 120 mg/dL — ABNORMAL HIGH (ref 70–99)
Potassium: 3.5 mEq/L (ref 3.5–5.1)
SODIUM: 140 meq/L (ref 135–145)

## 2015-08-18 NOTE — Progress Notes (Signed)
1.) Reason for visit: BP Check- Changed to Aldactone one week ago  2.) Name of MD requesting visit: Dr. Acie Fredrickson  3.) ROS related to problem: Allowed pt to sit for 10 mins after bringing him back before checking his BP.  BP noted to be 182/110, HR 64.  Waited 5 more mins and BP was 180/106.  Pt states that he has been taking Aldactone 12.5mg  at 8AM and then taking 12.5mg  at 4pm each day for about 5-6 days now.  Pt states that when he checks his BP at home it is 165-190/57-60.  Pt stated that he took his medications at 8AM this morning.  Pt states that when he takes the full 25mg  dose at one time his vision is clear but it is "weak" for about 4 hours and he just generally doesn't feel well for about 4 hours.  Pt states that once he urinates several times he feels much better.  Pt denies CP, SOB, HA, dizziness or lightheadedness.  4.) Assessment and plan per MD:  Dr. Acie Fredrickson requested that information be sent to him to review.  Advised pt that Sharyn Lull will be in contact with him in regards to any changes once Dr. Acie Fredrickson reviews pt's chart.

## 2015-08-18 NOTE — Patient Instructions (Addendum)
Dr. Acie Fredrickson will review findings today and our office will be in contact with you with any changes and your results from your blood work.

## 2015-08-18 NOTE — Telephone Encounter (Signed)
-----   Message from Thayer Headings, MD sent at 08/18/2015  5:38 PM EDT -----   ----- Message -----    From: Loren Racer, LPN    Sent: 1/74/7159   9:00 AM      To: Thayer Headings, MD

## 2015-08-18 NOTE — Progress Notes (Signed)
Will increase Aldactone to 50 mg a day  Check BMP in 1 week  Nursing visit for BP in 2 weeks .

## 2015-08-18 NOTE — Telephone Encounter (Signed)
Left message for patient to call me at the office tomorrow

## 2015-08-19 NOTE — Telephone Encounter (Signed)
Spoke with patient to review Dr. Elmarie Shiley advice from patient's nurse visit yesterday.  Dr. Acie Fredrickson advised him to increase Aldactone to 50 mg daily.  Patient states he is only taking Aldactone 12.5 mg daily.  I talked with him last week and he stated that he could not tolerate the 25 mg tablet and had decreased dose to 1/2.  He has not attempted to increase dose to 25 mg since that time.  I advised him to start 25 mg today and to call me back on Friday to let me know if he is tolerating that dose.  I advised we will schedule follow-up lab work and BP check based on his tolerance of the medication.  Patient verbalized understanding and agreement.  Dr. Acie Fredrickson, who is in the office today, is in agreement with plan of care.

## 2015-08-21 ENCOUNTER — Telehealth: Payer: Self-pay | Admitting: *Deleted

## 2015-08-21 ENCOUNTER — Other Ambulatory Visit: Payer: Self-pay | Admitting: *Deleted

## 2015-08-21 MED ORDER — TERAZOSIN HCL 2 MG PO CAPS: 2.0000 mg | ORAL_CAPSULE | Freq: Every evening | ORAL | Status: DC

## 2015-08-21 NOTE — Telephone Encounter (Signed)
Dr. Acie Fredrickson is in agreement to take over prescribing this medication.  May refill for 1 year.

## 2015-08-21 NOTE — Telephone Encounter (Signed)
Patient called and would like to know if Dr Acie Fredrickson would be willing to prescribe the terazosin 2mg  qd that he is taking. He stated that this was originally given to him by another physician and he has had no response back from their office in regards to refilling this. Please advise. Thanks, MI

## 2015-08-22 NOTE — Telephone Encounter (Addendum)
Follow up      Calling to let Dr Acie Fredrickson know that he is taking spironolactone 25mg  bid and his bp is still high-----175/100.  Also, did Dr Acie Fredrickson agree to refill his terazosine?

## 2015-08-22 NOTE — Telephone Encounter (Signed)
Spoke with patient who states he is tolerating the spironolactone 25 mg well, without adverse effects.  He states he feels wonderful but is concerned that blood pressure remains high.  He states he checks it throughout the day and numbers remained elevated, approximately 175/100 mmgHg.  He states his heart rate is a little elevated for him (60's bpm rather than 40's-50's in the past).  I advised him to continue to monitor vital signs and to bring his home cuff with him to his appointment with Dr. Acie Fredrickson on 10/11.  I advised him to call back next week if he has any concerns.  He denies headache, blurred vision, or any other symptoms of concern.  I advised that refills of terazosin were sent to [facility] yesterday.  He verbalized understanding and agreement with plan of care.

## 2015-08-28 ENCOUNTER — Telehealth: Payer: Self-pay

## 2015-08-28 NOTE — Telephone Encounter (Signed)
Spoke with patient who states he increased Aldactone to 25 mg BID after our conversation on Friday 9/30.  He c/o feeling burning in his right shoulder up to his neck.  I advised him that rash is a potential side effect of the medication.  We discussed that he could decrease the dose back to 25 mg once daily if the symptoms are too bothersome.  He states his blood pressure improves after he has a bowel movement.  He states today BP went from 198/109 mmHg to 149/84 mmHg after his bowel movement.  He states he has had a MRI in the past and wonders if this would have shown an adrenal gland causing hyperaldosteronism.  I advised him that it possibly would show that and that if he is able to get a copy of the results to bring them on Tuesday.  I advised him to bring BP readings to appointment on Tuesday 10/11 with Dr. Acie Fredrickson.  He verbalized understanding and agreement.

## 2015-08-28 NOTE — Telephone Encounter (Signed)
Patient called in reporting results from his medication change from 25 mg to 50 mg. He states that his BP is 198/109 this morning...but if he has a BM it will go down some.  In the mornings his vision is good but will go down as the day goes on as does his balance.  He hasn't fallen,but could. He states that all day long he feels like something is biting him (like a mosquito) at different parts of his body.  He states that he feels better in general as the day goes on.

## 2015-08-28 NOTE — Telephone Encounter (Signed)
Please verify his medication . We should probably work him in soon to evaluate.

## 2015-09-02 ENCOUNTER — Ambulatory Visit (INDEPENDENT_AMBULATORY_CARE_PROVIDER_SITE_OTHER): Payer: Medicare Other | Admitting: Cardiovascular Disease

## 2015-09-02 ENCOUNTER — Encounter: Payer: Self-pay | Admitting: Cardiovascular Disease

## 2015-09-02 VITALS — BP 170/100 | HR 58 | Ht 68.0 in | Wt 209.0 lb

## 2015-09-02 DIAGNOSIS — E785 Hyperlipidemia, unspecified: Secondary | ICD-10-CM

## 2015-09-02 DIAGNOSIS — I10 Essential (primary) hypertension: Secondary | ICD-10-CM | POA: Diagnosis not present

## 2015-09-02 LAB — BASIC METABOLIC PANEL
BUN: 17 mg/dL (ref 7–25)
CALCIUM: 9.3 mg/dL (ref 8.6–10.3)
CHLORIDE: 100 mmol/L (ref 98–110)
CO2: 29 mmol/L (ref 20–31)
CREATININE: 1.2 mg/dL — AB (ref 0.70–1.18)
Glucose, Bld: 108 mg/dL — ABNORMAL HIGH (ref 65–99)
Potassium: 3.8 mmol/L (ref 3.5–5.3)
SODIUM: 137 mmol/L (ref 135–146)

## 2015-09-02 MED ORDER — ATORVASTATIN CALCIUM 20 MG PO TABS: 20.0000 mg | ORAL_TABLET | Freq: Every day | ORAL | Status: DC

## 2015-09-02 MED ORDER — TERAZOSIN HCL 5 MG PO CAPS: 5.0000 mg | ORAL_CAPSULE | Freq: Every evening | ORAL | Status: DC

## 2015-09-02 NOTE — Progress Notes (Signed)
Cardiology Office Note   Date:  09/02/2015   ID:  Patrick LOISEAU, DOB Mar 27, 1946, MRN [id number]  PCP:  Leeanne Rio, PA-C  Cardiologist:   Acie Fredrickson Wonda Cheng, MD   Chief Complaint  Patient presents with  . Essential hypertension   Problem List 1. CVA - 2008  2. Dyspnea 3. Essential Hypertension 4. Hyperlipidemia    History of Present Illness: Patrick Roth is a 69 y.o. male who presents for some dyspnea  He has had HTN for years.  Recently has had some dyspnea - thinks its related to his BP meds.   Has tried multiple BP meds .  Intolerant to multiple meds.   Has seen Dr. Johnsie Cancel in the past.  Eats a low salt diet - for years.  Formerly worked as a Games developer .   Oct. 11, 2016:  Patrick Roth is seen for follow up  Still has significant HTN Is intolerent to many meds  , does not recall any specific allergies to specific meds.   Past Medical History  Diagnosis Date  . Hypertension   . Stroke [facility]) 2008  . Hyperlipidemia   . Colon polyps   . Sciatica   . Internal hemorrhoids   . History of chicken pox     Past Surgical History  Procedure Laterality Date  . Great toe surgery    . Prostate biopsy    . Colonoscopy  2011     Current Outpatient Prescriptions  Medication Sig Dispense Refill  . aspirin 81 MG chewable tablet Chew 81 mg by mouth daily.    . potassium chloride SA (K-DUR,KLOR-CON) 20 MEQ tablet Take 1 tablet (20 mEq total) by mouth 2 (two) times daily. 60 tablet 3  . pravastatin (PRAVACHOL) 20 MG tablet Take 1 tablet (20 mg total) by mouth daily. 30 tablet 3  . PROCTOSOL HC 2.5 % rectal cream Use as directed  0  . psyllium (HYDROCIL/METAMUCIL) 95 % PACK Take 1 packet by mouth daily.    Marland Kitchen spironolactone (ALDACTONE) 25 MG tablet Take 1 tablet (25 mg total) by mouth daily. 31 tablet 11  . terazosin (HYTRIN) 2 MG capsule Take 1 capsule (2 mg total) by mouth every evening. 30 capsule 11   No current facility-administered medications for this  visit.    Allergies:   Atenolol; Losartan; Amlodipine; Clonidine; Hydralazine; Hydrocodone-acetaminophen; Levofloxacin; Olmesartan; and Other    Social History:  The patient  reports that he has never smoked. He has never used smokeless tobacco. He reports that he does not drink alcohol or use illicit drugs.   Family History:  The patient's family history includes Healthy in his brother and sister; Heart disease in his brother and father; Hypertension (age of onset: 24) in his father; Lung cancer (age of onset: 68) in his mother; Lupus in his daughter; Other in his daughter. There is no history of Diabetes, Heart attack, Hyperlipidemia, or Sudden death.    ROS:  Please see the history of present illness.    Review of Systems: Constitutional:  denies fever, chills, diaphoresis, appetite change and fatigue.  HEENT: denies photophobia, eye pain, redness, hearing loss, ear pain, congestion, sore throat, rhinorrhea, sneezing, neck pain, neck stiffness and tinnitus.  Respiratory: denies SOB, DOE, cough, chest tightness, and wheezing.  Cardiovascular: denies chest pain, palpitations and leg swelling.  Gastrointestinal: denies nausea, vomiting, abdominal pain, diarrhea, constipation, blood in stool.  Genitourinary: denies dysuria, urgency, frequency, hematuria, flank pain and difficulty urinating.  Musculoskeletal: denies  myalgias, back pain, joint swelling, arthralgias and gait problem.   Skin: denies pallor, rash and wound.  Neurological: denies dizziness, seizures, syncope, weakness, light-headedness, numbness and headaches.   Hematological: denies adenopathy, easy bruising, personal or family bleeding history.  Psychiatric/ Behavioral: denies suicidal ideation, mood changes, confusion, nervousness, sleep disturbance and agitation.       All other systems are reviewed and negative.    PHYSICAL EXAM: VS:  BP 170/100 mmHg  Pulse 58  Ht 5\' 8"  (1.727 m)  Wt 94.802 kg (209 lb)  BMI 31.79  kg/m2  SpO2 98% , BMI Body mass index is 31.79 kg/(m^2). GEN: Well nourished, well developed, in no acute distress HEENT: normal Neck: no JVD, carotid bruits, or masses Cardiac: RRR with occasional premature beats ; no murmurs, rubs, or gallops,no edema  Respiratory:  clear to auscultation bilaterally, normal work of breathing GI: soft, nontender, nondistended, + BS MS: no deformity or atrophy Skin: warm and dry, no rash Neuro:  Strength and sensation are intact Psych: normal   EKG:  EKG is ordered today. The ekg ordered 8/31  demonstrates NSR , RBBB, frequent PACs    Recent Labs: 07/23/2015: ALT 21; Hemoglobin 16.5; Platelets 138.0* 08/06/2015: TSH 0.89 08/18/2015: BUN 13; Creatinine, Ser 1.22; Potassium 3.5; Sodium 140    Lipid Panel    Component Value Date/Time   CHOL 224* 07/23/2015 0915   TRIG 81.0 07/23/2015 0915   HDL 48.90 07/23/2015 0915   CHOLHDL 5 07/23/2015 0915   VLDL 16.2 07/23/2015 0915   LDLCALC 159* 07/23/2015 0915      Wt Readings from Last 3 Encounters:  09/02/15 94.802 kg (209 lb)  08/18/15 95.255 kg (210 lb)  08/05/15 94.348 kg (208 lb)      Other studies Reviewed: Additional studies/ records that were reviewed today include: . Review of the above records demonstrates:    ASSESSMENT AND PLAN:  1.  Hypertension:  He's been on Aldactone. Pressure has not budged much yet. We'll try increasing his Hytrin to 5 mg a day. I'll see him in 3 months. We'll anticipate increasing the Aldactone to 50 mg  a day at that time. We will check a basic metabolic profile today.  BMP today   Will ask him to see the pharmacy HTN clinic .   Current medicines are reviewed at length with the patient today.  The patient does not have concerns regarding medicines.  The following changes have been made:  no change  Labs/ tests ordered today include:  No orders of the defined types were placed in this encounter.     Disposition:   FU with me in 3 months     Meher Kucinski, Wonda Cheng, MD  09/02/2015 11:08 AM    [facility] [place], [place], [place]  [address] Phone: [phone]; Fax: [phone]   [facility]  [address] [place], [place]  [address] [phone]   Fax [phone]

## 2015-09-02 NOTE — Patient Instructions (Addendum)
Medication Instructions:  STOP Pravachol START Atorvastatin 20 mg once daily INCREASE Hytrin to 5 mg once in the evening  Labwork: TODAY - basic metabolic panel Your physician recommends that you return for lab work in: 3 months on the day of or a few days before your office visit with Dr. Acie Fredrickson.  You will need to FAST for this appointment - nothing to eat or drink after midnight the night before except water.    Testing/Procedures: None Ordered   Follow-Up: Your physician recommends that you schedule a follow-up appointment in: 3 months with Dr. Acie Fredrickson  Your physician recommends that you schedule an appointment with the Pharmacy Hypertension Clinic

## 2015-09-03 ENCOUNTER — Telehealth: Payer: Self-pay | Admitting: Cardiovascular Disease

## 2015-09-03 NOTE — Telephone Encounter (Signed)
Pt c/o medication issue:  1. Name of Medication: Spironolactone 25 mg and Terazosin 5 mg  2. How are you currently taking this medication (dosage and times per day)?   3. Are you having a reaction (difficulty breathing--STAT)?   4. What is your medication issue? After he takes medication in the morning he feels like he is going to pass out and is very weak.

## 2015-09-03 NOTE — Telephone Encounter (Signed)
Spoke with patient who states he feels weak and SOB when he takes medication in the morning.  He states he takes his medication after eating breakfast.  He reports he has tried taking 1/2 dose in the am and 1/2 dose in the pm with no improvement in symptoms.  He also reports he has tried taking the Terazosin at a different time with no improvement.  He also c/o high blood pressure associated with the sensation to have a bowel movement.  He states once the pressure from the bowels is relieved then his pressure goes down; states this occurs daily.  He denies anxiety associated with having a bowel movement daily.  I discussed the effect of bearing down and its affect on heart rate and blood pressure.  Patient states the bowel pressure occurs before he bears down to pass his stool.  He states he has been asking this question for years - why the bowel pressure causes high blood pressure.  I advised him that he should keep his appointment for the hypertension clinic for tomorrow.  I advised that if he feels that he cannot tolerate Spironolactone 25 mg that he can take 1/2 dose tomorrow morning.  He verbalized understanding and agreement with plan of care.

## 2015-09-04 ENCOUNTER — Ambulatory Visit (INDEPENDENT_AMBULATORY_CARE_PROVIDER_SITE_OTHER): Payer: Medicare Other | Admitting: Pharmacist

## 2015-09-04 VITALS — BP 188/111 | HR 63 | Ht 68.0 in | Wt 208.0 lb

## 2015-09-04 DIAGNOSIS — I10 Essential (primary) hypertension: Secondary | ICD-10-CM

## 2015-09-04 NOTE — Patient Instructions (Addendum)
When you go home, take your terazosin 5mg  and take spironolactone 50mg  (2 of your 25mg  tablets) to help bring your blood pressure down. Tomorrow morning, take spironolactone 25mg . Then before bedtime, start taking both blood pressure medications. I will see you again in blood pressure clinic in 2 weeks on Thursday, October 27 at [place] monitoring your blood pressure at home and bring in your cuff at the next visit.

## 2015-09-04 NOTE — Progress Notes (Signed)
[name]                 DOB: 10-13-1946, 69 yo                     MRN: [id number]     HPI: Patrick Roth is a 69 y.o. male referred by Dr. Acie Fredrickson to HTN clinic. PMH is significant for longstanding HTN, CVA in 2008, and HLD. Patient was seen by Dr. Acie Fredrickson 2 days ago. His BP was elevated at 170/100 at that visit, and terazosin was increased from 2mg  to 5mg  daily at that visit. He also takes spironolactone 25mg  daily which he has tolerated well. Of note, patient has intolerances to many BP medications (see below). Patient presents today for HTN management.  Has taken his higher dose of terazosin once so far. He usually takes his BP medications in the AM but did not take either of them yet today. He reports that he has dyspnea if he takes his morning meds but has not had a bowel movement. Patient has a long history of colon and GI problems (chronic constipation, hemorrhoids, polyps removed) that have improved recently, however many of his medication-related side effects seem to be related to his GI problems. He does report that his constipation has improved and he is sleeping much better.    Patient reports that he feels dizzy and that his BP is low when his sBP has been 130 in the past. BP reading elevated today in clinic at 188/111 with pulse of 63, likely because patient has not taken his meds yet today. He reports that this is a usual reading for him. Denies symptoms of headache or blurred vision.   Current HTN meds: terazosin 5mg  daily, spironolactone 25mg  daily Previously tried: atenolol, nebivolol, losartan, amlodipine, clonidine, hydralazine, olmesartan - intolerances to all. Low potassium and tooth pain, eye pain and twitching, coughing, underarm burning, kidney and colon problems. Many side effects likely related to GI issues and previous stroke.  BP goal: conservative goal <150/45mmHg based on age, however with stroke history, could aim for <130/15mmHg.   Family History:  Heart disease in his brother and father, HTN in his father (age of onset 46), lung cancer in his mother, lupus in his daughter  Social History: Patient has never smoked. He does not drink alcohol or use illicit drugs.  Diet: Has been eating a low sodium diet for years. Drinks tea and water.  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 Stretch exercises for legs.  Home BP readings: 150/85-21mmHg up to 224/156mmHg.  Labs: SCr stable 1.2, K 3.8 on spironolactone 25mg   Wt Readings from Last 3 Encounters:  09/02/15 209 lb (94.802 kg)  08/18/15 210 lb (95.255 kg)  08/05/15 208 lb (94.348 kg)   BP Readings from Last 3 Encounters:  09/02/15 170/100  08/18/15 180/106  08/05/15 180/100   Pulse Readings from Last 3 Encounters:  09/02/15 58  08/18/15 64  08/05/15 96    Renal function: Estimated Creatinine Clearance: 64 mL/min (by C-G formula based on Cr of 1.2).  Past Medical History  Diagnosis Date  . Hypertension   . Stroke [facility]) 2008  . Hyperlipidemia   . Colon polyps   . Sciatica   . Internal hemorrhoids   . History of chicken pox     Current Outpatient Prescriptions on File Prior to Visit  Medication Sig Dispense Refill  .  aspirin 81 MG chewable tablet Chew 81 mg by mouth daily.    Marland Kitchen atorvastatin (LIPITOR) 20 MG tablet Take 1 tablet (20 mg total) by mouth daily. 31 tablet 11  . potassium chloride SA (K-DUR,KLOR-CON) 20 MEQ tablet Take 1 tablet (20 mEq total) by mouth 2 (two) times daily. 60 tablet 3  . PROCTOSOL HC 2.5 % rectal cream Use as directed  0  . psyllium (HYDROCIL/METAMUCIL) 95 % PACK Take 1 packet by mouth daily.    Marland Kitchen spironolactone (ALDACTONE) 25 MG tablet Take 1 tablet (25 mg total) by mouth daily. 31 tablet 11  . terazosin (HYTRIN) 5 MG capsule Take 1 capsule (5 mg total) by mouth every evening. 31 capsule 11   No current facility-administered medications on file prior to visit.    Allergies    Allergen Reactions  . Atenolol Other (See Comments)    Drowsiness and "flu sxs"  . Losartan Other (See Comments)  . Amlodipine Other (See Comments)  . Clonidine Other (See Comments)  . Hydralazine Other (See Comments)    Joint swelling  . Hydrocodone-Acetaminophen Other (See Comments)  . Levofloxacin Other (See Comments)  . Olmesartan Other (See Comments)  . Other Other (See Comments)    Bystolic causing GI Issues     Assessment/Plan:  1. Hypertension - Patient above even conservative goal BP <150/33mmHg. He did not take his terazosin 5mg  or spironolactone this AM. He has a long list of intolerances to BP medications, although his side effects seem to overlap with GI issues and potential residual stroke issues. Will try to maximize current regimen as patient is tolerating current BP meds. Will increase dose of spironolactone to 50mg  and continue terazosin 5mg  (increased by Dr. Acie Fredrickson 2 days ago and patient has only taken 1 dose of higher strength). Will also move both terazosin and spironolactone to night time dosing to help force overnight dipping pattern. This should also help with patient's dyspnea which he notices an hour after taking morning meds if he has not had a bowel movement. Since he usually uses the bathroom in the morning, nighttime BP med dosing should help with this. See patient instructions for details regarding dosing of BP meds as the timing is shifted to PM. Will f/u in 2 weeks in BP clinic, may need further dose titration of spironolactone, will also check BMET at that time. Patient is in agreement with plan.    Megan E. Supple, PharmD [facility] [address] [place], [place] [address] Phone: [phone]; Fax: [phone] 09/04/2015 10:41 AM

## 2015-09-18 ENCOUNTER — Ambulatory Visit (INDEPENDENT_AMBULATORY_CARE_PROVIDER_SITE_OTHER): Payer: Medicare Other | Admitting: Pharmacist

## 2015-09-18 ENCOUNTER — Encounter: Payer: Self-pay | Admitting: Pharmacist

## 2015-09-18 VITALS — BP 164/102 | HR 62

## 2015-09-18 DIAGNOSIS — I10 Essential (primary) hypertension: Secondary | ICD-10-CM

## 2015-09-18 LAB — BASIC METABOLIC PANEL
BUN: 15 mg/dL (ref 7–25)
CO2: 27 mmol/L (ref 20–31)
CREATININE: 1.15 mg/dL (ref 0.70–1.18)
Calcium: 9.3 mg/dL (ref 8.6–10.3)
Chloride: 102 mmol/L (ref 98–110)
GLUCOSE: 92 mg/dL (ref 65–99)
Potassium: 4 mmol/L (ref 3.5–5.3)
Sodium: 138 mmol/L (ref 135–146)

## 2015-09-18 MED ORDER — SPIRONOLACTONE 100 MG PO TABS: 100.0000 mg | ORAL_TABLET | Freq: Every day | ORAL | Status: DC

## 2015-09-18 NOTE — Progress Notes (Signed)
Patient ID: Patrick Roth                 DOB: Jun 22, 1946, 69 yo                         MRN: [id number]     HPI: Patrick Roth is a 69 y.o. male referred by Dr. Acie Fredrickson to HTN clinic who presents today for 2 week f/u.  PMH is significant for longstanding HTN, CVA in 2008, and HLD. He has many intolerances to BP medications but has been tolerating terazosin and spironolactone well. Within the last month, his terazosin was increased to 5mg  and his spironolactone was titrated up to 50mg .   At our last visit, patient moved his HTN medications to the evening for tolerability. He reports that he has dyspnea if he takes his morning meds but has not had a bowel movement. Patient has a long history of colon and GI problems (chronic constipation, hemorrhoids, polyps removed) that have improved recently, however many of his medication-related side effects seem to be related to his GI problems. He does report that his constipation has improved since having his hemorrhoids removed and he is sleeping much better. Of note, pt recently started a multivitamin and reported that he had joint pain.  He reports that he feels better when his HR is at least 60. HR trends in 50s-60s for the most part. Pt is not on any HR lowering medications.   Patient brings his home BP cuff with him today. His home readings have ranged from 130s/60s to 170s/90s with pulse in 50s-70s. This is an improvement over his BP readings at our last visit, which ranged from 150s/80s up to 220s/120s. Patient reports feeling better overall, although he contributes this to removal of his hemorrhoids.  Home cuff reading in clinic: 170/107, pulse 61 Clinic reading: 164/102, pulse 62   Current HTN meds: terazosin 5mg , spironolactone 50mg  Previously tried: atenolol, nebivolol, losartan, amlodipine, clonidine, hydralazine, chlorthalidone, triamterene-HCTZ, olmesartan - intolerances to all. Low potassium and tooth pain, eye pain and twitching, coughing,  underarm burning, kidney and colon problems. Many side effects likely related to GI issues and previous stroke.  BP goal: conservative goal <150/18mmHg based on age, however with stroke history, could aim for <130/72mmHg.   Family History: Heart disease in his brother and father, HTN in his father (age of onset 69), lung cancer in his mother, lupus in his daughter  Social History: Patient has never smoked. He does not drink alcohol or use illicit drugs.  Diet: Has been eating a low sodium diet for years. Drinks tea and water.  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 Stretch exercises for legs.  Labs:  09/18/15:rechecking today (on spironolactone 50mg , K 56meq BID) 09/02/15: SCr 1.2, K 3.8 (on spironolactone 25mg , K 43meq BID) 08/18/15: SCr 1.22, K 3.5 (no spironolactone, K 47meq BID)  Wt Readings from Last 3 Encounters:  09/04/15 208 lb (94.348 kg)  09/02/15 209 lb (94.802 kg)  08/18/15 210 lb (95.255 kg)   BP Readings from Last 3 Encounters:  09/04/15 188/111  09/02/15 170/100  08/18/15 180/106   Pulse Readings from Last 3 Encounters:  09/04/15 63  09/02/15 58  08/18/15 64    Renal function: CrCl cannot be calculated (Unknown ideal weight.).  Past Medical History  Diagnosis Date  . Hypertension   . Stroke [facility]) 2008  . Hyperlipidemia   .  Colon polyps   . Sciatica   . Internal hemorrhoids   . History of chicken pox     Current Outpatient Prescriptions on File Prior to Visit  Medication Sig Dispense Refill  . aspirin 81 MG chewable tablet Chew 81 mg by mouth daily.    Marland Kitchen atorvastatin (LIPITOR) 20 MG tablet Take 1 tablet (20 mg total) by mouth daily. 31 tablet 11  . potassium chloride SA (K-DUR,KLOR-CON) 20 MEQ tablet Take 1 tablet (20 mEq total) by mouth 2 (two) times daily. 60 tablet 3  . PROCTOSOL HC 2.5 % rectal cream Use as directed  0  . psyllium (HYDROCIL/METAMUCIL) 95 % PACK Take 1 packet by  mouth daily.    Marland Kitchen terazosin (HYTRIN) 5 MG capsule Take 1 capsule (5 mg total) by mouth every evening. 31 capsule 11   No current facility-administered medications on file prior to visit.    Allergies  Allergen Reactions  . Atenolol Other (See Comments)    Drowsiness and "flu sxs"  . Losartan Other (See Comments)  . Amlodipine Other (See Comments)  . Clonidine Other (See Comments)  . Hydralazine Other (See Comments)    Joint swelling  . Hydrocodone-Acetaminophen Other (See Comments)  . Levofloxacin Other (See Comments)  . Olmesartan Other (See Comments)  . Other Other (See Comments)    Bystolic causing GI Issues     Assessment/Plan:  1. Hypertension - Patient with improved BP control but still above conservative goal <150/78mmHg. Pt was seen 2 weeks ago and spironolactone was increased from 25mg  to 50mg . Patient also continues on terazosin 5mg . He has intolerances to many other BP medications and does not want to try a new medication. Will increase spironolactone to 100mg , rechecking BMET today. K has been low stable despite additional supplementation with K 22meq BID. Will f/u in HTN clinic in 3 weeks, will recheck BMET again with further dose titration of spironolactone. Patient in agreement with plan and will bring his BP cuff to next visit.    Megan E. Supple, PharmD [facility] [address], [place], [place] [address] Phone: [phone]; Fax: [phone] 09/18/2015 12:35 PM

## 2015-09-18 NOTE — Patient Instructions (Signed)
Please pick up your prescription for spironolactone 100mg  to take in the evening. Continue taking terazosin 5mg  in the evening Continue to check your blood pressure at home We will see you in clinic in 3 weeks to recheck your blood pressure and labs on Thursday, November 17th at 11am Please bring in your blood pressure cuff with you for your next appointment  Call clinic if you have any problems [phone]

## 2015-10-04 ENCOUNTER — Telehealth: Payer: Self-pay | Admitting: Physician Assistant

## 2015-10-04 NOTE — Telephone Encounter (Signed)
Patrick Roth is a 69 y.o. male with a hx of HTN, HL, prior CVA. He calls in with several complaints.  He notes SEs to Spironolactone that occurs shortly after taking it (legs weak).  He notes his BP drops into the 110-120 range.  Most concerning of his symptoms are interscapular back pain and dyspnea with activity. He has had these symptoms before but today seems to be worse.  He notes some assoc jaw pain. His symptoms resolve with rest.  I have advised him to go to the ED today for evaluation as his back pain sounds like exertional angina.  He agrees with this plan.  Richardson Dopp, PA-C   10/04/2015 12:31 PM

## 2015-10-05 NOTE — Telephone Encounter (Signed)
Patient called me back today. He told me that he thought his symptoms were coming from hemorrhoids. He had a bowel movement yesterday with improved symptoms. I advised Mr. Respicio that exertional back pain and shortness of breath with assoc jaw pain would be unusual to be caused by hemorrhoids. I continue to advise him to go to the ED. He declines.  Will ask office to call him Monday to arrange FU office visit.  Richardson Dopp, PA-C   10/05/2015 11:51 AM

## 2015-10-06 ENCOUNTER — Telehealth: Payer: Self-pay | Admitting: Nurse Practitioner

## 2015-10-06 DIAGNOSIS — R079 Chest pain, unspecified: Secondary | ICD-10-CM

## 2015-10-06 NOTE — Telephone Encounter (Signed)
Spoke with patient who states he is feeling better today.  He states he feels that his symptoms were related to internal hemorrhoids.  He describes exertional pain in shoulder blades that resolves with rest.  He denies chest pain.  I advised him that cardiac causes of chest pain do not always present as pain in the chest and that as Richardson Dopp, PA suggested over the weekend, his symptoms are concerning.  I advised him that Dr. Acie Fredrickson would like to order a nuclear stress test and then follow up with him in the office.  He verbalized understanding and agreement.  He states BP is running 114-117/80, pulse 80 - states he checks his BP every Friday at [facility].  I advised him that someone from our office will call him to schedule test and a follow-up appointment with Dr. Acie Fredrickson.  He thanked me for the call.

## 2015-10-06 NOTE — Telephone Encounter (Signed)
Will get a myoview and see him following the [facility]

## 2015-10-09 ENCOUNTER — Ambulatory Visit (INDEPENDENT_AMBULATORY_CARE_PROVIDER_SITE_OTHER): Payer: Medicare Other | Admitting: Pharmacist

## 2015-10-09 ENCOUNTER — Telehealth (HOSPITAL_COMMUNITY): Payer: Self-pay

## 2015-10-09 VITALS — BP 144/72 | HR 47

## 2015-10-09 DIAGNOSIS — I10 Essential (primary) hypertension: Secondary | ICD-10-CM | POA: Diagnosis not present

## 2015-10-09 LAB — BASIC METABOLIC PANEL
BUN: 14 mg/dL (ref 7–25)
CHLORIDE: 102 mmol/L (ref 98–110)
CO2: 25 mmol/L (ref 20–31)
CREATININE: 1.12 mg/dL (ref 0.70–1.18)
Calcium: 9.2 mg/dL (ref 8.6–10.3)
GLUCOSE: 87 mg/dL (ref 65–99)
Potassium: 4 mmol/L (ref 3.5–5.3)
SODIUM: 139 mmol/L (ref 135–146)

## 2015-10-09 MED ORDER — ROSUVASTATIN CALCIUM 20 MG PO TABS: 20.0000 mg | ORAL_TABLET | Freq: Every day | ORAL | Status: DC

## 2015-10-09 NOTE — Telephone Encounter (Signed)
Encounter complete. 

## 2015-10-09 NOTE — Patient Instructions (Addendum)
Stop taking your Lipitor (atorvastatin) and start taking Crestor 20mg  once daily (rosuvastatin). See if this helps with the weakness You can stop taking your potassium supplement since the spironolactone is keeping your potassium up.  Continue taking your terazosin 5mg  and spironolactone 100mg  daily - your blood pressure is looking great. Continue to monitor it at home. Call Patrick Roth in clinic at [phone] if you notice that your blood pressure starts going up a lot, or if you are not tolerating the Crestor.

## 2015-10-09 NOTE — Progress Notes (Signed)
Patient ID: Patrick Roth                 DOB: 09/02/1946, 69 yo                         MRN: [id number]     HPI: Patrick Roth is a 69 y.o. male referred by Dr. Acie Fredrickson to HTN clinic who presents today for his 3rd BP visit. PMH is significant for longstanding HTN, CVA in 2008, and HLD. He has many intolerances to BP medications but has been tolerating terazosin and spironolactone well. Within the last month, his terazosin was increased to 5mg  and his spironolactone was titrated up to 100mg . He is taking his HTN medications in the evening which has helped with tolerability.  Of note, patient has a long history of colon and GI problems (chronic constipation, hemorrhoids, polyps removed). His symptoms have improved and he reports that the last of his hemorrhoids will be removed in the next few weeks. He reports that after they previously removed hemorrhoids, he slept much better, did not strain as much, and was not in as much pain. Many of his medication side effects also seem to be GI related. Hopefully with the last of the hemorrhoids removed, BP will improve as well.  Patient brings his home BP cuff with him today. His home readings have ranged from 120-140s/780-80s which is a huge improvement since initiation of spironolactone and dose titration. He checked his BP at [facility] and reports that the reading was 117/80s. Patient reports that he is feeling well overall.  Clinic BP reading: 144/72, pulse 47 (HR usually runs low, not on any bradycardic medications)   Patient also reports that he has been feeling fatigued with his Lipitor and that when he doesn't take it for a few days, he feels better.  Current HTN meds: terazosin 5mg , spironolactone 100mg  Previously tried: atenolol, nebivolol, losartan, amlodipine, clonidine, hydralazine, chlorthalidone, triamterene-HCTZ, olmesartan - intolerances to all. Low potassium and tooth pain, eye pain and twitching, coughing, underarm burning, kidney and colon  problems. Many side effects likely related to GI issues and previous stroke.  BP goal: conservative goal <150/56mmHg based on age, however with stroke history, could aim for <130/5mmHg.   Family History: Heart disease in his brother and father, HTN in his father (age of onset 38), lung cancer in his mother, lupus in his daughter  Social History: Patient has never smoked. He does not drink alcohol or use illicit drugs.  Diet: Has been eating a low sodium diet for years. Drinks tea and water.  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 Stretch exercises for legs.  Labs:  11/17: rechecking today (on spironolactone 100mg , K20mg  daily) Advised pt he can stop taking K supplements 09/18/15: SCr 1.15, K 4 (on spironolactone 50mg , K 39meq BID) 09/02/15: SCr 1.2, K 3.8 (on spironolactone 25mg , K 60meq BID) 08/18/15: SCr 1.22, K 3.5 (no spironolactone, K 43meq BID)   Wt Readings from Last 3 Encounters:  09/04/15 208 lb (94.348 kg)  09/02/15 209 lb (94.802 kg)  08/18/15 210 lb (95.255 kg)   BP Readings from Last 3 Encounters:  09/18/15 164/102  09/04/15 188/111  09/02/15 170/100   Pulse Readings from Last 3 Encounters:  09/18/15 62  09/04/15 63  09/02/15 58    Renal function: CrCl cannot be calculated (Unknown ideal weight.).  Past Medical History  Diagnosis Date  .  Hypertension   . Stroke [facility]) 2008  . Hyperlipidemia   . Colon polyps   . Sciatica   . Internal hemorrhoids   . History of chicken pox     Current Outpatient Prescriptions on File Prior to Visit  Medication Sig Dispense Refill  . aspirin 81 MG chewable tablet Chew 81 mg by mouth daily.    Marland Kitchen PROCTOSOL HC 2.5 % rectal cream Use as directed  0  . psyllium (HYDROCIL/METAMUCIL) 95 % PACK Take 1 packet by mouth daily.    Marland Kitchen spironolactone (ALDACTONE) 100 MG tablet Take 1 tablet (100 mg total) by mouth daily. 30 tablet 11  . terazosin (HYTRIN) 5 MG  capsule Take 1 capsule (5 mg total) by mouth every evening. 31 capsule 11   No current facility-administered medications on file prior to visit.    Allergies  Allergen Reactions  . Atenolol Other (See Comments)    Drowsiness and "flu sxs"  . Losartan Other (See Comments)  . Amlodipine Other (See Comments)  . Clonidine Other (See Comments)  . Hydralazine Other (See Comments)    Joint swelling  . Hydrocodone-Acetaminophen Other (See Comments)  . Levofloxacin Other (See Comments)  . Olmesartan Other (See Comments)  . Other Other (See Comments)    Bystolic causing GI Issues     Assessment/Plan:  1. Hypertension - Patient's BP is now at goal <150/23mmHg on spironolactone 100mg  and terazosin 5mg . Will have pt continue on current medications as he has a lengthy list of intolerances to other BP medications. He will continue to monitor his BP at home. Will stop K supplementation, pt was taking 39meq and K has increased from 3.5 to 4 since starting spironolactone. Rechecking BMET today with dose increase of spironolactone at last visit 3 weeks ago.  2. Hyperlipidemia - Patient reports fatigue with Lipitor 20mg . Will d/c and try pt on Crestor 20mg  daily. Advised him to call clinic if he still has fatigue and we can try cutting him back to Crestor 4-5 times a week.   Foy Vanduyne E. Kanna Dafoe, PharmD [place] [id number], [place], [place] [address] Phone: [phone]; Fax: [phone] 10/09/2015 1:10 PM

## 2015-10-10 ENCOUNTER — Ambulatory Visit (HOSPITAL_COMMUNITY)
Admission: RE | Admit: 2015-10-10 | Discharge: 2015-10-10 | Disposition: A | Discharge status: Home / Self Care | Payer: Medicare Other | Source: Ambulatory Visit | Attending: Cardiovascular Disease | Admitting: Cardiovascular Disease

## 2015-10-10 DIAGNOSIS — R079 Chest pain, unspecified: Secondary | ICD-10-CM | POA: Insufficient documentation

## 2015-10-10 LAB — MYOCARDIAL PERFUSION IMAGING
CHL CUP NUCLEAR SRS: 2
CHL CUP NUCLEAR SSS: 2
CSEPPHR: 81 {beats}/min
NUC STRESS TID: 1.15
Rest HR: 58 {beats}/min
SDS: 0

## 2015-10-10 MED ORDER — TECHNETIUM TC 99M SESTAMIBI GENERIC - CARDIOLITE
31.6000 | Freq: Once | INTRAVENOUS | Status: AC | PRN
  Administered 2015-10-10: 31.3 via INTRAVENOUS

## 2015-10-10 MED ORDER — TECHNETIUM TC 99M SESTAMIBI GENERIC - CARDIOLITE
10.9000 | Freq: Once | INTRAVENOUS | Status: AC | PRN
  Administered 2015-10-10: 10.8 via INTRAVENOUS

## 2015-10-10 MED ORDER — REGADENOSON 0.4 MG/5ML IV SOLN
0.4000 mg | Freq: Once | INTRAVENOUS | Status: AC
  Administered 2015-10-10: 0.4 mg via INTRAVENOUS

## 2015-10-13 ENCOUNTER — Ambulatory Visit (INDEPENDENT_AMBULATORY_CARE_PROVIDER_SITE_OTHER): Payer: Medicare Other | Admitting: Cardiovascular Disease

## 2015-10-13 ENCOUNTER — Encounter: Payer: Self-pay | Admitting: Cardiovascular Disease

## 2015-10-13 VITALS — BP 160/100 | HR 54 | Ht 68.0 in | Wt 209.4 lb

## 2015-10-13 DIAGNOSIS — I10 Essential (primary) hypertension: Secondary | ICD-10-CM | POA: Diagnosis not present

## 2015-10-13 DIAGNOSIS — R0609 Other forms of dyspnea: Secondary | ICD-10-CM

## 2015-10-13 DIAGNOSIS — R0600 Dyspnea, unspecified: Secondary | ICD-10-CM

## 2015-10-13 HISTORY — DX: Dyspnea, unspecified: R06.00

## 2015-10-13 HISTORY — DX: Other forms of dyspnea: R06.09

## 2015-10-13 NOTE — Progress Notes (Signed)
Cardiology Office Note   Date:  10/13/2015   ID:  Patrick Roth, DOB 07-15-1946, MRN [id number]  PCP:  Leeanne Rio, PA-C  Cardiologist:   Acie Fredrickson Wonda Cheng, MD   Chief Complaint  Patient presents with  . Hypertension   Problem List 1. CVA - 2008  2. Dyspnea 3. Essential Hypertension 4. Hyperlipidemia    History of Present Illness: Patrick Roth is a 69 y.o. male who presents for some dyspnea  He has had HTN for years.  Recently has had some dyspnea - thinks its related to his BP meds.   Has tried multiple BP meds .  Intolerant to multiple meds.   Has seen Dr. Johnsie Cancel in the past.  Eats a low salt diet - for years.  Formerly worked as a Games developer .   Oct. 11, 2016:  Patrick Roth is seen for follow up  Still has significant HTN Is intolerent to many meds  , does not recall any specific allergies to specific meds.   10/13/2015: Patrick Roth is seen today for follow-up of his high blood pressure. He had a stress Myoview study. Myoview studies showed no ischemia. Study was not gated. Doing well on the Aldactone 100 mg tabs.   Past Medical History  Diagnosis Date  . Hypertension   . Stroke [facility]) 2008  . Hyperlipidemia   . Colon polyps   . Sciatica   . Internal hemorrhoids   . History of chicken pox     Past Surgical History  Procedure Laterality Date  . Great toe surgery    . Prostate biopsy    . Colonoscopy  2011     Current Outpatient Prescriptions  Medication Sig Dispense Refill  . aspirin 81 MG chewable tablet Chew 81 mg by mouth daily.    Marland Kitchen atorvastatin (LIPITOR) 20 MG tablet Take 20 mg by mouth daily.    Marland Kitchen PROCTOSOL HC 2.5 % rectal cream Use as directed  0  . psyllium (HYDROCIL/METAMUCIL) 95 % PACK Take 1 packet by mouth daily.    Marland Kitchen spironolactone (ALDACTONE) 100 MG tablet Take 1 tablet (100 mg total) by mouth daily. 30 tablet 11  . terazosin (HYTRIN) 5 MG capsule Take 1 capsule (5 mg total) by mouth every evening. 31 capsule 11   No current  facility-administered medications for this visit.    Allergies:   Atenolol; Losartan; Amlodipine; Clonidine; Hydralazine; Hydrocodone-acetaminophen; Levofloxacin; Olmesartan; and Other    Social History:  The patient  reports that he has never smoked. He has never used smokeless tobacco. He reports that he does not drink alcohol or use illicit drugs.   Family History:  The patient's family history includes Healthy in his brother and sister; Heart disease in his brother and father; Hypertension (age of onset: 40) in his father; Lung cancer (age of onset: 3) in his mother; Lupus in his daughter; Other in his daughter. There is no history of Diabetes, Heart attack, Hyperlipidemia, or Sudden death.    ROS:  Please see the history of present illness.    Review of Systems: Constitutional:  denies fever, chills, diaphoresis, appetite change and fatigue.  HEENT: denies photophobia, eye pain, redness, hearing loss, ear pain, congestion, sore throat, rhinorrhea, sneezing, neck pain, neck stiffness and tinnitus.  Respiratory: denies SOB, DOE, cough, chest tightness, and wheezing.  Cardiovascular: denies chest pain, palpitations and leg swelling.  Gastrointestinal: denies nausea, vomiting, abdominal pain, diarrhea, constipation, blood in stool.  Genitourinary: denies dysuria, urgency, frequency, hematuria,  flank pain and difficulty urinating.  Musculoskeletal: denies  myalgias, back pain, joint swelling, arthralgias and gait problem.   Skin: denies pallor, rash and wound.  Neurological: denies dizziness, seizures, syncope, weakness, light-headedness, numbness and headaches.   Hematological: denies adenopathy, easy bruising, personal or family bleeding history.  Psychiatric/ Behavioral: denies suicidal ideation, mood changes, confusion, nervousness, sleep disturbance and agitation.       All other systems are reviewed and negative.    PHYSICAL EXAM: VS:  BP 160/100 mmHg  Pulse 54  Ht 5\' 8"   (1.727 m)  Wt 209 lb 6.4 oz (94.983 kg)  BMI 31.85 kg/m2  SpO2 98% , BMI Body mass index is 31.85 kg/(m^2). GEN: Well nourished, well developed, in no acute distress HEENT: normal Neck: no JVD, carotid bruits, or masses Cardiac: RRR with occasional premature beats ; no murmurs, rubs, or gallops,no edema  Respiratory:  clear to auscultation bilaterally, normal work of breathing GI: soft, nontender, nondistended, + BS MS: no deformity or atrophy Skin: warm and dry, no rash Neuro:  Strength and sensation are intact Psych: normal   EKG:  EKG is ordered today. The ekg ordered 8/31  demonstrates NSR , RBBB, frequent PACs    Recent Labs: 07/23/2015: ALT 21; Hemoglobin 16.5; Platelets 138.0* 08/06/2015: TSH 0.89 10/09/2015: BUN 14; Creat 1.12; Potassium 4.0; Sodium 139    Lipid Panel    Component Value Date/Time   CHOL 224* 07/23/2015 0915   TRIG 81.0 07/23/2015 0915   HDL 48.90 07/23/2015 0915   CHOLHDL 5 07/23/2015 0915   VLDL 16.2 07/23/2015 0915   LDLCALC 159* 07/23/2015 0915      Wt Readings from Last 3 Encounters:  10/13/15 209 lb 6.4 oz (94.983 kg)  10/10/15 209 lb (94.802 kg)  09/04/15 208 lb (94.348 kg)      Other studies Reviewed: Additional studies/ records that were reviewed today include: . Review of the above records demonstrates:    ASSESSMENT AND PLAN:  1.  Hypertension:  He's been on Aldactone 100 mg a day and Hytrin. BP has been normal. . Will see him in  6 months .  Has had some dyspnea with exertion. Will get an echo   2. History of stroke. He inquired about his carotid arteries. I'm quite certain that his carotids were checked around the time of the stroke in 2008. He does not have any bruits.  We'll continue to keep close eye on this.  Current medicines are reviewed at length with the patient today.  The patient does not have concerns regarding medicines.  The following changes have been made:  no change  Labs/ tests ordered today include:   No orders of the defined types were placed in this encounter.    Disposition:   FU with me in 3 months   Lannis Lichtenwalner, Wonda Cheng, MD  10/13/2015 2:39 PM    [place], [place], [place]  [address] Phone: [phone]; Fax: [phone]   [facility]  [address] [place], [place]  [address] [phone]   Fax [phone]

## 2015-10-13 NOTE — Patient Instructions (Signed)
Medication Instructions:  Your physician recommends that you continue on your current medications as directed. Please refer to the Current Medication list given to you today.   Labwork: None Ordered   Testing/Procedures: Your physician has requested that you have an echocardiogram. Echocardiography is a painless test that uses sound waves to create images of your heart. It provides your doctor with information about the size and shape of your heart and how well your heart's chambers and valves are working. This procedure takes approximately one hour. There are no restrictions for this procedure.   Follow-Up Your physician wants you to follow-up in: 6 months with Dr. [name].  You will receive a reminder letter in the mail two months in advance. If you don't receive a letter, please call our office to schedule the follow-up appointment.   If you need a refill on your cardiac medications before your next appointment, please call your pharmacy.   Thank you for choosing [facility]! Onofrio Klemp, RN [phone]    

## 2015-10-23 ENCOUNTER — Ambulatory Visit (HOSPITAL_COMMUNITY): Payer: Medicare Other | Attending: Internal Medicine

## 2015-10-23 ENCOUNTER — Other Ambulatory Visit: Payer: Self-pay

## 2015-10-23 DIAGNOSIS — I10 Essential (primary) hypertension: Secondary | ICD-10-CM | POA: Insufficient documentation

## 2015-10-23 DIAGNOSIS — E785 Hyperlipidemia, unspecified: Secondary | ICD-10-CM | POA: Insufficient documentation

## 2015-10-23 DIAGNOSIS — I517 Cardiomegaly: Secondary | ICD-10-CM | POA: Diagnosis not present

## 2015-10-23 DIAGNOSIS — R0600 Dyspnea, unspecified: Secondary | ICD-10-CM | POA: Insufficient documentation

## 2015-10-23 DIAGNOSIS — I071 Rheumatic tricuspid insufficiency: Secondary | ICD-10-CM | POA: Insufficient documentation

## 2015-10-23 DIAGNOSIS — Z6831 Body mass index (BMI) 31.0-31.9, adult: Secondary | ICD-10-CM | POA: Diagnosis not present

## 2015-10-23 DIAGNOSIS — I340 Nonrheumatic mitral (valve) insufficiency: Secondary | ICD-10-CM | POA: Diagnosis not present

## 2015-10-23 DIAGNOSIS — I5189 Other ill-defined heart diseases: Secondary | ICD-10-CM | POA: Diagnosis not present

## 2015-10-23 DIAGNOSIS — E669 Obesity, unspecified: Secondary | ICD-10-CM | POA: Insufficient documentation

## 2015-11-25 ENCOUNTER — Other Ambulatory Visit: Payer: Medicare Other

## 2015-12-01 ENCOUNTER — Ambulatory Visit: Payer: Medicare Other | Admitting: Cardiovascular Disease

## 2015-12-31 DIAGNOSIS — Z01818 Encounter for other preprocedural examination: Secondary | ICD-10-CM | POA: Diagnosis not present

## 2015-12-31 DIAGNOSIS — K648 Other hemorrhoids: Secondary | ICD-10-CM | POA: Diagnosis not present

## 2015-12-31 DIAGNOSIS — K635 Polyp of colon: Secondary | ICD-10-CM | POA: Diagnosis not present

## 2016-01-06 DIAGNOSIS — K635 Polyp of colon: Secondary | ICD-10-CM | POA: Diagnosis not present

## 2016-01-14 DIAGNOSIS — K625 Hemorrhage of anus and rectum: Secondary | ICD-10-CM | POA: Diagnosis not present

## 2016-01-14 DIAGNOSIS — Z86010 Personal history of colonic polyps: Secondary | ICD-10-CM | POA: Diagnosis not present

## 2016-01-28 ENCOUNTER — Telehealth: Payer: Self-pay | Admitting: Cardiovascular Disease

## 2016-01-28 NOTE — Telephone Encounter (Signed)
Pt had hemmorhoids surgery about 3 weeks ago. Since that time he have been experiencing shortness of breath,weakness in his hips,when chewing he gets real tired.Please call to advise.

## 2016-01-28 NOTE — Telephone Encounter (Signed)
Spoke with patient who states he has pain in his hip and pressure in the area of his colon that has been there for several years but since having hemorrhoid surgery, he notices that he has weakness with chewing.  He denies abdominal bloating or pressure or chest discomfort.  He reports he continues to monitor BP and that it continues to fluctuate.  He reports most recent BP 145/60 mmHg, pulse 64 bpm.  He states BP is lower sometimes when he doesn't take his medicine.  I advised him to continue current medications and to follow up with PCP for hip pain, colon pressure and weakness with chewing.  I scheduled his 6 month ov with Dr. Acie Fredrickson for May 25 and advised him to call back for sooner appointment if needed.  He verbalized understanding and agreement.  I am routing to Dr. Acie Fredrickson for his awareness.

## 2016-01-29 ENCOUNTER — Telehealth: Payer: Self-pay | Admitting: Behavioral Health

## 2016-01-29 ENCOUNTER — Ambulatory Visit (INDEPENDENT_AMBULATORY_CARE_PROVIDER_SITE_OTHER): Payer: Medicare Other | Admitting: Medical

## 2016-01-29 ENCOUNTER — Encounter: Payer: Self-pay | Admitting: Medical

## 2016-01-29 VITALS — BP 132/86 | HR 78 | Temp 98.1°F | Ht 68.0 in | Wt 212.8 lb

## 2016-01-29 DIAGNOSIS — M25551 Pain in right hip: Secondary | ICD-10-CM | POA: Diagnosis not present

## 2016-01-29 DIAGNOSIS — R0600 Dyspnea, unspecified: Secondary | ICD-10-CM | POA: Diagnosis not present

## 2016-01-29 DIAGNOSIS — I10 Essential (primary) hypertension: Secondary | ICD-10-CM

## 2016-01-29 DIAGNOSIS — N62 Hypertrophy of breast: Secondary | ICD-10-CM

## 2016-01-29 DIAGNOSIS — R5383 Other fatigue: Secondary | ICD-10-CM

## 2016-01-29 LAB — D-DIMER, QUANTITATIVE (NOT AT ARMC): D DIMER QUANT: 0.29 ug{FEU}/mL (ref 0.00–0.48)

## 2016-01-29 LAB — BRAIN NATRIURETIC PEPTIDE: PRO B NATRI PEPTIDE: 152 pg/mL — AB (ref 0.0–100.0)

## 2016-01-29 NOTE — Progress Notes (Signed)
Pre visit review using our clinic review tool, if applicable. No additional management support is needed unless otherwise documented below in the visit note. 

## 2016-01-29 NOTE — Telephone Encounter (Signed)
I agree with note from Michelle Swinyer, RN 

## 2016-01-29 NOTE — Telephone Encounter (Signed)
Caller: [facility]  Reason for call: STAT lab results for D-Dimer  Per Janett Billow, the result of the D-Dimer was 0.29, which is normal range. This information is being faxed to our office as well.

## 2016-01-29 NOTE — Patient Instructions (Addendum)
Continue current bp medications for your htn history.  Your tender enlarged chest is likely from spirinolactone.(Will need to follow may even to mammogram. Sometimes necessary in males.  For your dypnea will get cxr, bnp and d-dimer. You had ekg, stress test and echo. All of these results look ok but some PAC and PVC on stress test report. May need to refer back to your cardiologist.  For your pelvic area pain will get xray of pelvis  Follow up in 10-14 days(Cody or myslef) or as needed

## 2016-01-29 NOTE — Progress Notes (Signed)
Subjective:    Patient ID: Patrick Roth, male    DOB: [birth year], 70 y.o.   MRN: [id number]  HPI  Pt in stating he feels like he can't get enough oxygen. Pt had normal perfusion stress test done ws good. Pt had some shortness of breath during the test.Pt had echo done in past and showed LVF of 55-60%. Mild lvh. Pt feels this daily now. Pt not anemic in the summer. No popliteal pain.  On pt stress test showed some PAC and PVC. Ptr has some mild pulmonary htn.  Some had surgical procedure for his hemorhoids(this was done 3 weeks ago). He feels better now in rectal area. But he thought he was more lack of oxygen sensation worse after.  When he walks up and down steps will feel short of breath. No cramps in his legs. Pt states hips don't hurt but feel weak.  Pt has intermittent fatigue with activity.  Pt has some nipple pain in the past and some now. In past was caused by med. He got off and he got better. He can't remember which one. Pt is currently on spirinolactone. Pt not reporting increase size of chest/breast.  Pt has history of high blood pressure and stroke. In past his bp was closer to 160/90.  But now much better.  Pt had stroke about 10 yrs ago.  Occasional rt si area pain and rt pelvis region pain.   Pt has hx of recent polyps last year in November. Pt will follow with GI in one year.       Review of Systems  Constitutional: Positive for fatigue. Negative for fever, chills and diaphoresis.  Respiratory: Positive for shortness of breath. Negative for chest tightness and wheezing.        Not now but has some sob with activity.  Cardiovascular: Negative for chest pain and palpitations.  Gastrointestinal: Negative for abdominal pain.  Genitourinary: Negative for frequency, flank pain and difficulty urinating.       Frequency much improved now compared to past.  Musculoskeletal: Negative for myalgias, back pain and neck stiffness.       Hips feel week when he feels sob.    Skin: Negative for rash.  Neurological: Negative for dizziness, syncope, light-headedness, numbness and headaches.  Hematological: Negative for adenopathy. Does not bruise/bleed easily.  Psychiatric/Behavioral: Negative for behavioral problems and confusion.    Past Medical History  Diagnosis Date  . Hypertension   . Stroke [facility]) 2008  . Hyperlipidemia   . Colon polyps   . Sciatica   . Internal hemorrhoids   . History of chicken pox     Social History   Social History  . Marital Status: Married    Spouse Name: N/A  . Number of Children: 1  . Years of Education: 12   Occupational History  . Retired    Social History Main Topics  . Smoking status: Never Smoker   . Smokeless tobacco: Never Used  . Alcohol Use: No  . Drug Use: No  . Sexual Activity: Not on file   Other Topics Concern  . Not on file   Social History Narrative   Fun: Play golf and build furniture.     Past Surgical History  Procedure Laterality Date  . Great toe surgery    . Prostate biopsy    . Colonoscopy  2011    Family History  Problem Relation Age of Onset  . Hypertension Father 26    Deceased  .  Heart disease Father   . Diabetes Neg Hx   . Heart attack Neg Hx   . Hyperlipidemia Neg Hx   . Sudden death Neg Hx   . Lung cancer Mother 14    Deceased  . Healthy Sister     x3  . Healthy Brother     x4  . Heart disease Brother     #5  . Other Daughter     Alpha Thalassemia  . Lupus Daughter     #2-deceased    Allergies  Allergen Reactions  . Atenolol Other (See Comments)    Drowsiness and "flu sxs"  . Losartan Other (See Comments)  . Amlodipine Other (See Comments)  . Clonidine Other (See Comments)  . Hydralazine Other (See Comments)    Joint swelling  . Hydrocodone-Acetaminophen Other (See Comments)  . Levofloxacin Other (See Comments)  . Olmesartan Other (See Comments)  . Other Other (See Comments)    Bystolic causing GI Issues    Current Outpatient Prescriptions on  File Prior to Visit  Medication Sig Dispense Refill  . aspirin 81 MG chewable tablet Chew 81 mg by mouth daily.    Marland Kitchen atorvastatin (LIPITOR) 20 MG tablet Take 20 mg by mouth daily.    Marland Kitchen PROCTOSOL HC 2.5 % rectal cream Use as directed  0  . psyllium (HYDROCIL/METAMUCIL) 95 % PACK Take 1 packet by mouth daily.    Marland Kitchen spironolactone (ALDACTONE) 100 MG tablet Take 1 tablet (100 mg total) by mouth daily. 30 tablet 11  . terazosin (HYTRIN) 5 MG capsule Take 1 capsule (5 mg total) by mouth every evening. 31 capsule 11   No current facility-administered medications on file prior to visit.    BP 132/86 mmHg  Pulse 78  Temp(Src) 98.1 F (36.7 C) (Oral)  Ht 5\' 8"  (1.727 m)  Wt 212 lb 12.8 oz (96.525 kg)  BMI 32.36 kg/m2  SpO2 98%      Objective:   Physical Exam  General Mental Status- Alert. General Appearance- Not in acute distress.   Skin General: Color- Normal Color. Moisture- Normal Moisture.  Neck Carotid Arteries- Normal color. Moisture- Normal Moisture. No carotid bruits. No JVD.  Chest and Lung Exam Auscultation: Breath Sounds:-Normal. CTA.(pt walked down hall today. His 02 sat remained at 98%). He pulse started at 79. When up to 90. At end of walk 88)  Cardiovascular Auscultation:Rythm- Regular, Regular Murmurs & Other Heart Sounds:Auscultation of the heart reveals- No Murmurs.  Abdomen Inspection:-Inspeection Normal. Palpation/Percussion:Note:No mass. Palpation and Percussion of the abdomen reveal- Non Tender, Non Distended + BS, no rebound or guarding.    Neurologic Cranial Nerve exam:- CN III-XII intact(No nystagmus), symmetric smile. Strength:- 5/5 equal and symmetric strength both upper and lower extremities.  Lower ext- no pain on rom of hips, Neg homans signs, no pedal edema.  Back and pelvis- no mid lumbar pain onpalpation. But rt si area pain on palpation. And points to rt iliac crest region as source of pain.      Assessment & Plan:  Continue current  bp medications for your htn history.  Your tender enlarged chest is likely from spirinolactone.(Will need to follow may even to mammogram. Sometimes necessary in males.  For your dypnea will get cxr, bnp and d-dimer. You had ekg, stress test and echo. All of these results look ok but some PAC and PVC on stress test report. May need to refer back to your cardiologist.  For your pelvic area pain will get xray of  pelvis  Follow up in 10-14 days or as needed  Pt made aware of his d dimer is positive then will order ct thorax.

## 2016-01-29 NOTE — Telephone Encounter (Signed)
I will forward to the treating provider.   Danton Sewer. And thank you for seeing Mr Sampayo today.

## 2016-01-30 LAB — COMPREHENSIVE METABOLIC PANEL
ALK PHOS: 60 U/L (ref 39–117)
ALT: 21 U/L (ref 0–53)
AST: 25 U/L (ref 0–37)
Albumin: 4.2 g/dL (ref 3.5–5.2)
BILIRUBIN TOTAL: 1.2 mg/dL (ref 0.2–1.2)
BUN: 16 mg/dL (ref 6–23)
CO2: 32 mEq/L (ref 19–32)
Calcium: 9.5 mg/dL (ref 8.4–10.5)
Chloride: 102 mEq/L (ref 96–112)
Creatinine, Ser: 1.27 mg/dL (ref 0.40–1.50)
GFR: 71.9 mL/min (ref >60.00)
GLUCOSE: 90 mg/dL (ref 70–99)
Potassium: 4.1 mEq/L (ref 3.5–5.1)
SODIUM: 139 meq/L (ref 135–145)
TOTAL PROTEIN: 7.4 g/dL (ref 6.0–8.3)

## 2016-01-30 LAB — CBC WITH DIFFERENTIAL/PLATELET
BASOS ABS: 0 10*3/uL (ref 0.0–0.1)
Basophils Relative: 0.6 % (ref 0.0–3.0)
EOS PCT: 2.4 % (ref 0.0–5.0)
Eosinophils Absolute: 0.1 10*3/uL (ref 0.0–0.7)
HEMATOCRIT: 44.2 % (ref 39.0–52.0)
Hemoglobin: 14.6 g/dL (ref 13.0–17.0)
LYMPHS PCT: 30.2 % (ref 12.0–46.0)
Lymphs Abs: 1.3 10*3/uL (ref 0.7–4.0)
MCHC: 33.1 g/dL (ref 30.0–36.0)
MCV: 86.9 fl (ref 78.0–100.0)
MONOS PCT: 11.9 % (ref 3.0–12.0)
Monocytes Absolute: 0.5 10*3/uL (ref 0.1–1.0)
NEUTROS ABS: 2.3 10*3/uL (ref 1.4–7.7)
Neutrophils Relative %: 54.9 % (ref 43.0–77.0)
Platelets: 139 10*3/uL — ABNORMAL LOW (ref 150.0–400.0)
RBC: 5.08 Mil/uL (ref 4.22–5.81)
RDW: 14.1 % (ref 11.5–15.5)
WBC: 4.3 10*3/uL (ref 4.0–10.5)

## 2016-02-02 ENCOUNTER — Telehealth: Payer: Self-pay | Admitting: Physician Assistant

## 2016-02-02 ENCOUNTER — Telehealth: Payer: Self-pay | Admitting: Cardiovascular Disease

## 2016-02-02 NOTE — Telephone Encounter (Signed)
Pt stopped his cholesterol medication. Since he thinks he is making him sob. He stopped med and states sob resolved. Pt has appointment with Elyn Aquas PA-C at 10:00 am on February 13, 2016. I advised him to come in fasting in case his lipids will be checked. He is going to check with cardiologst on if spirinolactone can be stopped since he has gynecomastia.

## 2016-02-02 NOTE — Telephone Encounter (Signed)
Patrick Roth could you please advise.//AB/CMA

## 2016-02-02 NOTE — Telephone Encounter (Signed)
Agree with note by Chelle Swinyer, RN  

## 2016-02-02 NOTE — Telephone Encounter (Signed)
Pt called in because he says that he came in on 01/29/16 experiencing some shortness of breath from taking Rosuvastatin 20 mg. He would like to know if he should stop taking them? ALSO pt says that his spironolactone medication is causing his breast nipples to be sore. Pt would like to be advised about his medications.   CB: [phone]

## 2016-02-02 NOTE — Telephone Encounter (Signed)
Follow Up:; ° ° °Returning your call. °

## 2016-02-02 NOTE — Telephone Encounter (Signed)
New message      Pt c/o medication issue:  1. Name of Medication: spironolactone 2. How are you currently taking this medication (dosage and times per day)? 100mg  daily 3. Are you having a reaction (difficulty breathing--STAT)? no 4. What is your medication issue? Medication is making patient's breast nipples sore.  Please advise

## 2016-02-02 NOTE — Telephone Encounter (Signed)
Edward please advise.  

## 2016-02-02 NOTE — Telephone Encounter (Signed)
Left message for patient to call back  

## 2016-02-02 NOTE — Telephone Encounter (Signed)
Spoke with patient who states he has stopped atorvastatin due to hip, jaw and throat soreness.  He states he had difficulty walking and chewing and that these symptoms have resolved since stopping the atorvastatin.  Patient called Elyn Aquas, PA at his primary care office and they have ordered fasting lipid levels for next week.  I advised him that he can have them manage his hyperlipidemia since they are getting the lab work.  I verified that spironolactone can cause gynecomastia and asked if he has tried reducing the dose.  He states he has taken half a dose for awhile and has also tried taking the medication at a different time of day to see if symptoms improve and they did not.  He states he is okay to continue the spironolactone even with these symptoms because he is relieved that hip and jaw pain has resolved.  I advised him to call back if symptoms worsen prior to next office visit with Dr. Acie Fredrickson in May.  He verbalized understanding and agreement.

## 2016-02-13 ENCOUNTER — Ambulatory Visit (INDEPENDENT_AMBULATORY_CARE_PROVIDER_SITE_OTHER): Payer: Medicare Other | Admitting: Physician Assistant

## 2016-02-13 ENCOUNTER — Encounter: Payer: Self-pay | Admitting: Physician Assistant

## 2016-02-13 ENCOUNTER — Ambulatory Visit (HOSPITAL_BASED_OUTPATIENT_CLINIC_OR_DEPARTMENT_OTHER)
Admission: RE | Admit: 2016-02-13 | Discharge: 2016-02-13 | Disposition: A | Discharge status: Home / Self Care | Payer: Medicare Other | Source: Ambulatory Visit | Attending: Physician Assistant | Admitting: Physician Assistant

## 2016-02-13 VITALS — BP 150/72 | HR 61 | Temp 97.6°F | Ht 68.0 in | Wt 210.0 lb

## 2016-02-13 DIAGNOSIS — I700 Atherosclerosis of aorta: Secondary | ICD-10-CM | POA: Diagnosis not present

## 2016-02-13 DIAGNOSIS — M545 Low back pain, unspecified: Secondary | ICD-10-CM

## 2016-02-13 DIAGNOSIS — R918 Other nonspecific abnormal finding of lung field: Secondary | ICD-10-CM | POA: Insufficient documentation

## 2016-02-13 DIAGNOSIS — E785 Hyperlipidemia, unspecified: Secondary | ICD-10-CM | POA: Diagnosis not present

## 2016-02-13 DIAGNOSIS — R0600 Dyspnea, unspecified: Secondary | ICD-10-CM | POA: Diagnosis not present

## 2016-02-13 DIAGNOSIS — M47896 Other spondylosis, lumbar region: Secondary | ICD-10-CM | POA: Diagnosis not present

## 2016-02-13 DIAGNOSIS — R29898 Other symptoms and signs involving the musculoskeletal system: Secondary | ICD-10-CM | POA: Diagnosis not present

## 2016-02-13 DIAGNOSIS — M160 Bilateral primary osteoarthritis of hip: Secondary | ICD-10-CM | POA: Diagnosis not present

## 2016-02-13 DIAGNOSIS — I708 Atherosclerosis of other arteries: Secondary | ICD-10-CM | POA: Diagnosis not present

## 2016-02-13 DIAGNOSIS — M47816 Spondylosis without myelopathy or radiculopathy, lumbar region: Secondary | ICD-10-CM | POA: Diagnosis not present

## 2016-02-13 DIAGNOSIS — I517 Cardiomegaly: Secondary | ICD-10-CM | POA: Diagnosis not present

## 2016-02-13 DIAGNOSIS — R0602 Shortness of breath: Secondary | ICD-10-CM | POA: Diagnosis not present

## 2016-02-13 HISTORY — DX: Hyperlipidemia, unspecified: E78.5

## 2016-02-13 LAB — CBC
HCT: 46.3 % (ref 39.0–52.0)
HEMOGLOBIN: 15.2 g/dL (ref 13.0–17.0)
MCHC: 32.7 g/dL (ref 30.0–36.0)
MCV: 87.5 fl (ref 78.0–100.0)
PLATELETS: 140 10*3/uL — AB (ref 150.0–400.0)
RBC: 5.29 Mil/uL (ref 4.22–5.81)
RDW: 14 % (ref 11.5–15.5)
WBC: 4.2 10*3/uL (ref 4.0–10.5)

## 2016-02-13 LAB — BASIC METABOLIC PANEL
BUN: 22 mg/dL (ref 6–23)
CHLORIDE: 102 meq/L (ref 96–112)
CO2: 31 mEq/L (ref 19–32)
Calcium: 9.6 mg/dL (ref 8.4–10.5)
Creatinine, Ser: 1.24 mg/dL (ref 0.40–1.50)
GFR: 73.9 mL/min (ref >60.00)
GLUCOSE: 98 mg/dL (ref 70–99)
POTASSIUM: 3.7 meq/L (ref 3.5–5.1)
Sodium: 140 mEq/L (ref 135–145)

## 2016-02-13 LAB — LIPID PANEL
CHOL/HDL RATIO: 4
Cholesterol: 209 mg/dL — ABNORMAL HIGH (ref 0–200)
HDL: 52.9 mg/dL (ref >39.00)
LDL Cholesterol: 142 mg/dL — ABNORMAL HIGH (ref 0–99)
NONHDL: 155.88
Triglycerides: 71 mg/dL (ref 0.0–149.0)
VLDL: 14.2 mg/dL (ref 0.0–40.0)

## 2016-02-13 LAB — SEDIMENTATION RATE: Sed Rate: 11 mm/hr (ref 0–22)

## 2016-02-13 NOTE — Progress Notes (Signed)
Pre visit review using our clinic review tool, if applicable. No additional management support is needed unless otherwise documented below in the visit note. 

## 2016-02-13 NOTE — Progress Notes (Signed)
Patient with history of CHF, Hypertension, Hyperlipidemia presents to clinic today for follow-up from visit last week with another provider. Patient was seen on 01/29/2016 by Mr. Mackie Pai PA-C another provider in our office. On review of EMR it seems patient presented with multiple complaints.   Patient had complaints of bilateral hip weakness with ambulation associated with increased fatigue and SOB with exertion. Provider noted patient has had recent cardiac workup with Cardiologist Dr. Cathie Olden including Nuclear stress test (no ischemia per Cardiology) and Echocardiogram (normal LV function, mild diastolic dysfunction, mild tricuspid regurgitations and mild pulmonary HTN). Patient was instructed to have labs to assess BNP and to get a CXR and x-ray of hips due to complaints. BNP was noted to be just slightly elevated at 152. Patient did not get CXR as he states he did not know he was supposed to have one. Patient notes he has cut his spironolactone in half as he states he feels he does not need the full dose given by Cardiology. Patient with significant history of medication noncompliance and self-titrating medications. Patient states he still has some mild SOB with significant exertion. None at rest. Patient denies chest pain, palpitations, lightheadedness, dizziness, vision changes or frequent headaches.  BP Readings from Last 3 Encounters:  02/13/16 150/72  01/29/16 132/86  10/13/15 160/100   Patient still endorses chronic pain and weakness of hips. Does note aching pain of lower back but denies radiation of pain. Denies trauma or injury.   Past Medical History  Diagnosis Date  . Hypertension   . Stroke [facility]) 2008  . Hyperlipidemia   . Colon polyps   . Sciatica   . Internal hemorrhoids   . History of chicken pox     Current Outpatient Prescriptions on File Prior to Visit  Medication Sig Dispense Refill  . aspirin 81 MG chewable tablet Chew 81 mg by mouth daily.    . psyllium  (HYDROCIL/METAMUCIL) 95 % PACK Take 1 packet by mouth daily.    Marland Kitchen spironolactone (ALDACTONE) 100 MG tablet Take 1 tablet (100 mg total) by mouth daily. 30 tablet 11  . terazosin (HYTRIN) 5 MG capsule Take 1 capsule (5 mg total) by mouth every evening. (Patient taking differently: Take 5 mg by mouth every evening. Taking 1/2 tablet by mouth daily.) 31 capsule 11  . atorvastatin (LIPITOR) 20 MG tablet Take 20 mg by mouth daily. Reported on 02/13/2016     No current facility-administered medications on file prior to visit.    Allergies  Allergen Reactions  . Atenolol Other (See Comments)    Drowsiness and "flu sxs"  . Losartan Other (See Comments)  . Amlodipine Other (See Comments)  . Clonidine Other (See Comments)  . Hydralazine Other (See Comments)    Joint swelling  . Hydrocodone-Acetaminophen Other (See Comments)  . Levofloxacin Other (See Comments)  . Olmesartan Other (See Comments)  . Other Other (See Comments)    Bystolic causing GI Issues    Family History  Problem Relation Age of Onset  . Hypertension Father 26    Deceased  . Heart disease Father   . Diabetes Neg Hx   . Heart attack Neg Hx   . Hyperlipidemia Neg Hx   . Sudden death Neg Hx   . Lung cancer Mother 53    Deceased  . Healthy Sister     x3  . Healthy Brother     x4  . Heart disease Brother     #5  . Other  Daughter     Alpha Thalassemia  . Lupus Daughter     #2-deceased    Social History   Social History  . Marital Status: Married    Spouse Name: N/A  . Number of Children: 1  . Years of Education: 12   Occupational History  . Retired    Social History Main Topics  . Smoking status: Never Smoker   . Smokeless tobacco: Never Used  . Alcohol Use: No  . Drug Use: No  . Sexual Activity: Not Asked   Other Topics Concern  . None   Social History Narrative   Fun: Play golf and build furniture.     Review of Systems - See HPI.  All other ROS are negative.  BP 150/72 mmHg  Pulse 61   Temp(Src) 97.6 F (36.4 C) (Oral)  Ht 5' 8"  (1.727 m)  Wt 210 lb (95.255 kg)  BMI 31.94 kg/m2  SpO2 99%  Physical Exam  Constitutional: He is oriented to person, place, and time and well-developed, well-nourished, and in no distress.  HENT:  Head: Normocephalic and atraumatic.  Eyes: Conjunctivae are normal.  Neck: Neck supple.  Cardiovascular: Normal rate, regular rhythm, normal heart sounds and intact distal pulses.   Pulmonary/Chest: Effort normal and breath sounds normal. No respiratory distress. He has no wheezes. He has no rales. He exhibits no tenderness.  Musculoskeletal:       Right hip: Normal.       Left hip: Normal.       Lumbar back: He exhibits pain. He exhibits no tenderness and no spasm.  Neurological: He is alert and oriented to person, place, and time. No cranial nerve deficit.  Skin: Skin is warm and dry. No rash noted.  Vitals reviewed.   Recent Results (from the past 2160 hour(s))  D-Dimer, Quantitative     Status: None   Collection Time: 01/29/16  2:56 PM  Result Value Ref Range   D-Dimer, Quant 0.29 0.00 - 0.48 ug/mL-FEU    Comment: At the inhouse established cutoff value of 0.48 ug/mL FEU, this methology has been documented in the literature to have a sensitivity and negative predictive value of at least 98-99%.  The test result should be correlated with an assessment of the clinical probability of DVT/VTE.   B Nat Peptide     Status: Abnormal   Collection Time: 01/29/16  2:56 PM  Result Value Ref Range   Pro B Natriuretic peptide (BNP) 152.0 (H) 0.0 - 100.0 pg/mL  CBC w/Diff     Status: Abnormal   Collection Time: 01/29/16  3:07 PM  Result Value Ref Range   WBC 4.3 4.0 - 10.5 K/uL   RBC 5.08 4.22 - 5.81 Mil/uL   Hemoglobin 14.6 13.0 - 17.0 g/dL   HCT 44.2 39.0 - 52.0 %   MCV 86.9 78.0 - 100.0 fl   MCHC 33.1 30.0 - 36.0 g/dL   RDW 14.1 11.5 - 15.5 %   Platelets 139.0 (L) 150.0 - 400.0 K/uL   Neutrophils Relative % 54.9 43.0 - 77.0 %    Lymphocytes Relative 30.2 12.0 - 46.0 %   Monocytes Relative 11.9 3.0 - 12.0 %   Eosinophils Relative 2.4 0.0 - 5.0 %   Basophils Relative 0.6 0.0 - 3.0 %   Neutro Abs 2.3 1.4 - 7.7 K/uL   Lymphs Abs 1.3 0.7 - 4.0 K/uL   Monocytes Absolute 0.5 0.1 - 1.0 K/uL   Eosinophils Absolute 0.1 0.0 - 0.7 K/uL  Basophils Absolute 0.0 0.0 - 0.1 K/uL  Comprehensive metabolic panel     Status: None   Collection Time: 01/29/16  3:07 PM  Result Value Ref Range   Sodium 139 135 - 145 mEq/L   Potassium 4.1 3.5 - 5.1 mEq/L   Chloride 102 96 - 112 mEq/L   CO2 32 19 - 32 mEq/L   Glucose, Bld 90 70 - 99 mg/dL   BUN 16 6 - 23 mg/dL   Creatinine, Ser 1.27 0.40 - 1.50 mg/dL   Total Bilirubin 1.2 0.2 - 1.2 mg/dL   Alkaline Phosphatase 60 39 - 117 U/L   AST 25 0 - 37 U/L   ALT 21 0 - 53 U/L   Total Protein 7.4 6.0 - 8.3 g/dL   Albumin 4.2 3.5 - 5.2 g/dL   Calcium 9.5 8.4 - 10.5 mg/dL   GFR 71.90 >60.00 mL/min    Assessment/Plan: 1. Bilateral low back pain without sciatica Will obtain x-ray today to further assess. Discussed supportive measures and OTC pain medications. Patient cannot tolerate Hydrocodone and related medications due to SOB. Discussed potential use of other medications but patient declines at present.  - DG Lumbar Spine Complete; Future - DG HIPS BILAT W OR W/O PELVIS 2V; Future  2. Dyspnea Chronic. Suspect partly due to deconditioning. Patient with history of malignant hypertension. Noncompliant with medications and Cardiology recommendations. Prior negative workup. Encourage him to follow-up with specialists.  Will check CXR today to further assess.  - DG Chest 2 View; Future  3. Pelvic girdle weakness Patient to have x-rays today to further assess. Will check the following labs: - CBC - Basic Metabolic Panel (BMET) - Sed Rate (ESR)

## 2016-02-13 NOTE — Patient Instructions (Signed)
Please go to the lab for blood work. I will call with results. Then go downstairs for imaging.  Continue chronic medications as directed. Your exam is good today. Avoid heavy lifting or overexertion. Schedule a follow-up with your Cardiologist.  We will alter treatment and schedule follow-up based on your results.

## 2016-02-16 ENCOUNTER — Other Ambulatory Visit: Payer: Self-pay | Admitting: Physician Assistant

## 2016-02-16 MED ORDER — SIMVASTATIN 20 MG PO TABS: 20.0000 mg | ORAL_TABLET | Freq: Every day | ORAL | Status: DC

## 2016-02-17 ENCOUNTER — Telehealth: Payer: Self-pay | Admitting: Physician Assistant

## 2016-02-17 ENCOUNTER — Telehealth: Payer: Self-pay | Admitting: Cardiovascular Disease

## 2016-02-17 DIAGNOSIS — I739 Peripheral vascular disease, unspecified: Secondary | ICD-10-CM

## 2016-02-17 MED ORDER — SPIRONOLACTONE 100 MG PO TABS: 50.0000 mg | ORAL_TABLET | Freq: Every day | ORAL | Status: DC

## 2016-02-17 MED ORDER — ROSUVASTATIN CALCIUM 5 MG PO TABS: ORAL_TABLET | ORAL | Status: DC

## 2016-02-17 NOTE — Telephone Encounter (Signed)
Called and spoke with the pt and informed him of the note below.   Pt verbalized understanding.  Pt stated that he is willing to try the Rosuvastatin, but he will talk with the pharmacy first to get a little more information on the drug before starting on it.  Rx sent to the pharmacy by e-script.//AB/CMA

## 2016-02-17 NOTE — Telephone Encounter (Signed)
Spoke with patient about enlarged heart.  I explained that the chest xray can cause heart to look enlarged because of the 2 flat plate view but that patient has had an echo which does not show an enlarged heart per Dr. Acie Fredrickson.  He states he reduced his dose of spironolactone to 50 mg daily due to aches associated with Zocor.  He states he feels better on the reduced dose of spironolactone.  He is scheduled for follow-up with Dr. Acie Fredrickson in May and I advised him to call back with questions or concerns prior to that visit.  He verbalized understanding and agreement with plan.

## 2016-02-17 NOTE — Telephone Encounter (Signed)
See if he is willing to try Rosuvastatin 5 mg tab, 1 tab po 2 x a week, disp #10, this one is newer and water soluble. Thus it does not cause as many side effects

## 2016-02-17 NOTE — Telephone Encounter (Signed)
Called and spoke with the pt regarding the note below regarding side affects to the simvastatin.  Asked the pt what was the side affects he had when he took the simvastatin before.  Pt stated that when he took it it caused SOB and muscle cramps.  He stated that the side affect symptoms were the same as what he had with the other cholesterol medications.  Please advise.//AB/CMA

## 2016-02-17 NOTE — Telephone Encounter (Signed)
New message     Patient calling xray done at PCP -enlarge heart - wants to discuss with the nurse - questions & concerns

## 2016-02-17 NOTE — Telephone Encounter (Signed)
Pt called in because he says that he was prescribed simvastatin. Pt says that he has taken that medication before and has had terrible side affects from it. Pt would like to be prescribed something else.    CB: [phone]

## 2016-02-18 ENCOUNTER — Telehealth: Payer: Self-pay | Admitting: *Deleted

## 2016-02-18 ENCOUNTER — Other Ambulatory Visit: Payer: Self-pay | Admitting: Physician Assistant

## 2016-02-18 NOTE — Telephone Encounter (Signed)
-----   Message from Brunetta Jeans, PA-C sent at 02/18/2016  1:20 PM EDT ----- These medications do not cause pneumonia or cold symptoms. My concern is that he had some SOB and that worries me in regards to prescribing similar medications. We could consider a very low dose of Celebrex once daily to help with pain, or we can do a topical prescription pain medication like Voltaren that typically works well. Please let me know what he decides.

## 2016-02-19 NOTE — Telephone Encounter (Signed)
Spoke to the  And he stated his pain is better now and does not want anything at this time  But he did state he is working to get all his medical records to PCP in order to see all his information.

## 2016-02-23 NOTE — Telephone Encounter (Signed)
Pt called and said that the rosuvastatin has so far caused the same issues (SOB and muscle cramps). He is worried about continuing the med. Pt also asked if the burning sensation in his leg/foot could be from lead poisoning. Please call at [phone].

## 2016-02-23 NOTE — Telephone Encounter (Signed)
Lead poisoning highly  doubtful unless he has significant occupational exposure to gasoline, lead paint, etc. Happy to check lead level if he would like. For cholesterol -- have him stop Crestor and add to allergy/intolerance list. Can try to get Zetia approved if he would like.

## 2016-02-25 ENCOUNTER — Telehealth: Payer: Self-pay | Admitting: Physician Assistant

## 2016-02-25 MED ORDER — EZETIMIBE 10 MG PO TABS: 10.0000 mg | ORAL_TABLET | Freq: Every day | ORAL | Status: DC

## 2016-02-25 NOTE — Telephone Encounter (Signed)
Have him call pharmacy to have them fax [place] a Prior Auth so we can fill out paperwork to get medication covered for him. I do not have other options since he cannot tolerate statins.

## 2016-02-25 NOTE — Addendum Note (Signed)
Addended by: Harl Bowie on: 02/25/2016 09:13 AM   Modules accepted: Orders

## 2016-02-25 NOTE — Telephone Encounter (Signed)
Again he endorses shortness of breath with the crestor and previous statins. I will not prescribe them due to risk of SOB or anaphylaxis with this class of medication. This is all based on things he has previously endorses. Again recommend we proceed with the prior auth for the Zetia.

## 2016-02-25 NOTE — Telephone Encounter (Signed)
Pharmacy: [facility] [address]  Pt said he called ins and they will cover Lovastatin or pravastatin without prior auth. Pt said he would prefer this med be sent to pharmacy rather than PA on Zetia. Pt also asked if there is anything natural/supplement to help.   Pt# [phone]

## 2016-02-25 NOTE — Telephone Encounter (Signed)
Called and spoke with the pt and informed him of the note below.  Pt verbalized understanding and agreed to proceed with the prior auth for the Zetia.  Asked the pt to please call the pharmacy and have them sent over the prior auth request because the request has all his information on it.  Pt agreed.//AB/CMA

## 2016-02-25 NOTE — Telephone Encounter (Signed)
Called pt back to inform of PCP's message. No answer. No voicemail. Unable to lvm.

## 2016-02-25 NOTE — Telephone Encounter (Signed)
Pt says that he was prescribed a medication for his cholesterol that is to expensive. Pt would like to know if something else could be prescribed that's cheaper. Advised pt to call insurance company for a formulary of medication. I suggested that maybe insurance could help with pricing to help provider determine what would work well for pt.    Follow up  CB # : [phone]

## 2016-02-25 NOTE — Telephone Encounter (Addendum)
Called and spoke with the pt and informed him of the note below.  Pt verbalized understanding.  Pt stated that he has not and does not work around any of the things mentioned, but he just heard something.  Asked the pt if he wanted to try the the Zetia, and he stated that he will try it and for me to go ahead and send in the prescription.  He stated that he will check to see what the side the medication has.  Prescription sent to the pharmacy by e-script.  Pt also asked about the referral regarding the hip x-ray.   Pt states he thought he was getting a referral to Rheumatologist for his hip pain.  Informed the pt again of his hip x-ray results and informed him that the referral was for Vascular surgery for the calcification in the arteries and peripheral arterial disease.  Pt verbalized understanding and agreed to the referral to Vascular surgery.  Referral placed and sent.//AB/CMA

## 2016-03-19 ENCOUNTER — Other Ambulatory Visit: Payer: Self-pay | Admitting: *Deleted

## 2016-03-19 DIAGNOSIS — I7092 Chronic total occlusion of artery of the extremities: Secondary | ICD-10-CM

## 2016-04-05 ENCOUNTER — Telehealth: Payer: Self-pay | Admitting: Cardiovascular Disease

## 2016-04-05 DIAGNOSIS — R972 Elevated prostate specific antigen [PSA]: Secondary | ICD-10-CM | POA: Diagnosis not present

## 2016-04-05 NOTE — Telephone Encounter (Signed)
Left message with patient's wife for him to call back.

## 2016-04-05 NOTE — Telephone Encounter (Signed)
New message      Pt c/o medication issue:  1. Name of Medication: Terazosin  2. How are you currently taking this medication (dosage and times per day)? 5 mg po once a day  3. Are you having a reaction (difficulty breathing--STAT)? Heart increase and stiff in neck, on left side stiffness in joint, pt complain of frequent trip to bathroom  4. What is your medication issue? The pt stopped taking the medication, about three days ago, the pt wants to speak with nurse regarding

## 2016-04-05 NOTE — Telephone Encounter (Signed)
Spoke with patient who states he has stopped zetia due to leg and ankle pain - stopped approximately 2 weeks ago.  He states he also stopped terazosin approximately 1 week ago due to fast heart rate, burning sensation in neck.  I advised him to remain off medications until appointment with Dr. Acie Fredrickson next week.  I advised him to continue to monitor BP and bring those readings to the appointment.  He verbalized understanding and agreement.

## 2016-04-08 ENCOUNTER — Emergency Department (HOSPITAL_BASED_OUTPATIENT_CLINIC_OR_DEPARTMENT_OTHER): Payer: Medicare Other

## 2016-04-08 ENCOUNTER — Encounter (HOSPITAL_BASED_OUTPATIENT_CLINIC_OR_DEPARTMENT_OTHER): Payer: Self-pay | Admitting: *Deleted

## 2016-04-08 ENCOUNTER — Emergency Department (HOSPITAL_BASED_OUTPATIENT_CLINIC_OR_DEPARTMENT_OTHER)
Admission: EM | Admit: 2016-04-08 | Discharge: 2016-04-08 | Disposition: A | Discharge status: Home / Self Care | Payer: Medicare Other | Attending: Emergency Medicine | Admitting: Emergency Medicine

## 2016-04-08 DIAGNOSIS — B9789 Other viral agents as the cause of diseases classified elsewhere: Secondary | ICD-10-CM

## 2016-04-08 DIAGNOSIS — J069 Acute upper respiratory infection, unspecified: Secondary | ICD-10-CM | POA: Diagnosis not present

## 2016-04-08 DIAGNOSIS — R05 Cough: Secondary | ICD-10-CM | POA: Diagnosis not present

## 2016-04-08 DIAGNOSIS — Z888 Allergy status to other drugs, medicaments and biological substances status: Secondary | ICD-10-CM | POA: Diagnosis not present

## 2016-04-08 DIAGNOSIS — I10 Essential (primary) hypertension: Secondary | ICD-10-CM | POA: Insufficient documentation

## 2016-04-08 DIAGNOSIS — Z7982 Long term (current) use of aspirin: Secondary | ICD-10-CM | POA: Diagnosis not present

## 2016-04-08 DIAGNOSIS — Z8673 Personal history of transient ischemic attack (TIA), and cerebral infarction without residual deficits: Secondary | ICD-10-CM | POA: Insufficient documentation

## 2016-04-08 DIAGNOSIS — E785 Hyperlipidemia, unspecified: Secondary | ICD-10-CM | POA: Insufficient documentation

## 2016-04-08 LAB — CBC WITH DIFFERENTIAL/PLATELET
BASOS PCT: 0 %
Basophils Absolute: 0 10*3/uL (ref 0.0–0.1)
Eosinophils Absolute: 0.1 10*3/uL (ref 0.0–0.7)
Eosinophils Relative: 2 %
HEMATOCRIT: 45.3 % (ref 39.0–52.0)
HEMOGLOBIN: 15.2 g/dL (ref 13.0–17.0)
LYMPHS ABS: 1 10*3/uL (ref 0.7–4.0)
LYMPHS PCT: 16 %
MCH: 29.2 pg (ref 26.0–34.0)
MCHC: 33.6 g/dL (ref 30.0–36.0)
MCV: 87.1 fL (ref 78.0–100.0)
MONO ABS: 0.8 10*3/uL (ref 0.1–1.0)
MONOS PCT: 13 %
NEUTROS ABS: 4.2 10*3/uL (ref 1.7–7.7)
NEUTROS PCT: 69 %
Platelets: 151 10*3/uL (ref 150–400)
RBC: 5.2 MIL/uL (ref 4.22–5.81)
RDW: 13.4 % (ref 11.5–15.5)
WBC: 6.1 10*3/uL (ref 4.0–10.5)

## 2016-04-08 LAB — BASIC METABOLIC PANEL
Anion gap: 6 (ref 5–15)
BUN: 13 mg/dL (ref 6–20)
CHLORIDE: 103 mmol/L (ref 101–111)
CO2: 29 mmol/L (ref 22–32)
CREATININE: 1.4 mg/dL — AB (ref 0.61–1.24)
Calcium: 9 mg/dL (ref 8.9–10.3)
GFR calc non Af Amer: 49 mL/min — ABNORMAL LOW (ref >60)
GFR, EST AFRICAN AMERICAN: 57 mL/min — AB (ref >60)
GLUCOSE: 109 mg/dL — AB (ref 65–99)
Potassium: 3.5 mmol/L (ref 3.5–5.1)
Sodium: 138 mmol/L (ref 135–145)

## 2016-04-08 MED ORDER — AZITHROMYCIN 250 MG PO TABS: 250.0000 mg | ORAL_TABLET | Freq: Every day | ORAL | Status: DC

## 2016-04-08 MED ORDER — BENZONATATE 100 MG PO CAPS: 100.0000 mg | ORAL_CAPSULE | Freq: Three times a day (TID) | ORAL | Status: DC

## 2016-04-08 MED ORDER — IBUPROFEN 800 MG PO TABS
800.0000 mg | ORAL_TABLET | Freq: Once | ORAL | Status: AC
  Administered 2016-04-08: 800 mg via ORAL
  Filled 2016-04-08: qty 1

## 2016-04-08 NOTE — ED Notes (Signed)
Pt verbalizes understanding of d/c instructions and denies any further need at this time. 

## 2016-04-08 NOTE — ED Provider Notes (Signed)
CSN: [id number]     Arrival date & time 04/08/16  2049 History  By signing my name below, I, Arianna Nassar, attest that this documentation has been prepared under the direction and in the presence of Leo Grosser, MD. Electronically Signed: Julien Nordmann, ED Scribe. 04/08/2016. 9:12 PM.      Chief Complaint  Patient presents with  . Fever  . Cough     The history is provided by the patient. No language interpreter was used.   HPI Comments: YONI LAUTURE is a 70 y.o. male who HTN, CVA, HLD, sciatica and internal hemorrhoids presents to the Emergency Department complaining of a constant, gradual worsening, moderate productive cough that brings up yellow sputum onset a two weeks ago. He reports associated fever (x 2 days), chills, and shortness of breath. He endorses increased pain when coughing. Pt states he has had these symptoms in the past after being taken off certain blood pressure medication. No aggravating or modifying factors noted. He denies leg swelling, hx of UTI, dysuria, urinary frequency out of baseline, or hematuria.  Past Medical History  Diagnosis Date  . Hypertension   . Stroke [facility]) 2008  . Hyperlipidemia   . Colon polyps   . Sciatica   . Internal hemorrhoids   . History of chicken pox    Past Surgical History  Procedure Laterality Date  . Great toe surgery    . Prostate biopsy    . Colonoscopy  2011   Family History  Problem Relation Age of Onset  . Hypertension Father 58    Deceased  . Heart disease Father   . Diabetes Neg Hx   . Heart attack Neg Hx   . Hyperlipidemia Neg Hx   . Sudden death Neg Hx   . Lung cancer Mother 11    Deceased  . Healthy Sister     x3  . Healthy Brother     x4  . Heart disease Brother     #5  . Other Daughter     Alpha Thalassemia  . Lupus Daughter     #2-deceased   Social History  Substance Use Topics  . Smoking status: Never Smoker   . Smokeless tobacco: Never Used  . Alcohol Use: No    Review of Systems   Constitutional: Positive for fever.  Respiratory: Positive for cough and shortness of breath.   Genitourinary: Negative for dysuria, frequency and hematuria.  All other systems reviewed and are negative.     Allergies  Atenolol; Crestor; Losartan; Amlodipine; Clonidine; Hydralazine; Hydrocodone-acetaminophen; Levofloxacin; Olmesartan; and Other  Home Medications   Prior to Admission medications   Medication Sig Start Date End Date Taking? Authorizing Provider  aspirin 81 MG chewable tablet Chew 81 mg by mouth daily.    Historical Provider, MD  ezetimibe (ZETIA) 10 MG tablet Take 1 tablet (10 mg total) by mouth daily. 02/25/16   Brunetta Jeans, PA-C  psyllium (HYDROCIL/METAMUCIL) 95 % PACK Take 1 packet by mouth daily.    Historical Provider, MD  rosuvastatin (CRESTOR) 5 MG tablet Take 1 tablet by mouth 2 times a week. 02/17/16   Mosie Lukes, MD  spironolactone (ALDACTONE) 100 MG tablet Take 0.5 tablets (50 mg total) by mouth daily. 02/17/16   Thayer Headings, MD  terazosin (HYTRIN) 5 MG capsule Take 1 capsule (5 mg total) by mouth every evening. Patient taking differently: Take 5 mg by mouth every evening. Taking 1/2 tablet by mouth daily. 09/02/15   Wonda Cheng  [name], MD   Triage vitals: BP 152/99 mmHg  Pulse 70  Temp(Src) 99.2 F (37.3 C) (Oral)  Resp 20  Ht 5' 8.5" (1.74 m)  Wt 210 lb (95.255 kg)  BMI 31.46 kg/m2  SpO2 100% Physical Exam  Constitutional: He is oriented to person, place, and time. He appears well-developed and well-nourished.  Well appearing  HENT:  Head: Normocephalic and atraumatic.  Mouth/Throat: Oropharyngeal exudate present.  Eyes: EOM are normal.  Neck: Normal range of motion.  Cardiovascular: Normal rate, regular rhythm, normal heart sounds and intact distal pulses.  Exam reveals no gallop and no friction rub.   No murmur heard. Pulmonary/Chest: Effort normal and breath sounds normal. No respiratory distress.  Clear ausculation bilaterally   Abdominal: Soft. He exhibits no distension. There is no tenderness.  Musculoskeletal: Normal range of motion.  No edema of bilateral legs  Neurological: He is alert and oriented to person, place, and time.  Skin: Skin is warm and dry.  Psychiatric: He has a normal mood and affect. Judgment normal.  Nursing note and vitals reviewed.   ED Course  Procedures  DIAGNOSTIC STUDIES: Oxygen Saturation is 100% on RA, normal by my interpretation.  COORDINATION OF CARE:  9:12 PM Discussed treatment plan with pt at bedside and pt agreed to plan.  Labs Review Labs Reviewed  BASIC METABOLIC PANEL - Abnormal; Notable for the following:    Glucose, Bld 109 (*)    Creatinine, Ser 1.40 (*)    GFR calc non Af Amer 49 (*)    GFR calc Af Amer 57 (*)    All other components within normal limits  CBC WITH DIFFERENTIAL/PLATELET    Imaging Review Dg Chest 2 View  04/08/2016  CLINICAL DATA:  Cough and fever for 2 days.  Initial encounter. EXAM: CHEST  2 VIEW COMPARISON:  PA and lateral chest 02/13/2016. FINDINGS: Heart size is upper normal. The lungs are clear. No pneumothorax or pleural effusion. No focal bony abnormality. IMPRESSION: No acute disease. Electronically Signed   By: Inge Rise M.D.   On: 04/08/2016 21:43   I have personally reviewed and evaluated these images and lab results as part of my medical decision-making.   EKG Interpretation None      MDM   Final diagnoses:  Viral URI with cough    70 y.o. male presents with cough and low grade fever. No adventitious lung sounds. XR negative for pneumonia. Given azithro to cover atypicals and tessalon for cough. No leukocytosis or other concerning features for developing sepsis. Plan to follow up with PCP as needed and return precautions discussed for worsening or new concerning symptoms.   I personally performed the services described in this documentation, which was scribed in my presence. The recorded information has been  reviewed and is accurate.     Leo Grosser, MD 04/09/16 [phone]

## 2016-04-08 NOTE — ED Notes (Signed)
Fever, cough and sob x 2 days.

## 2016-04-08 NOTE — ED Notes (Signed)
Pt c/o coughing for two days andfever of "about 101-102," has not taken anything for fever though.  Pt was nauseated yesterday and c/o generalized body aches.  Pt's lungs are clear on right, left is diminished and pt is unable to take a deep breath d/t coughing.

## 2016-04-08 NOTE — Discharge Instructions (Signed)
Upper Respiratory Infection, Adult Most upper respiratory infections (URIs) are a viral infection of the air passages leading to the lungs. A URI affects the nose, throat, and upper air passages. The most common type of URI is nasopharyngitis and is typically referred to as "the common cold." URIs run their course and usually go away on their own. Most of the time, a URI does not require medical attention, but sometimes a bacterial infection in the upper airways can follow a viral infection. This is called a secondary infection. Sinus and middle ear infections are common types of secondary upper respiratory infections. Bacterial pneumonia can also complicate a URI. A URI can worsen asthma and chronic obstructive pulmonary disease (COPD). Sometimes, these complications can require emergency medical care and may be life threatening.  CAUSES Almost all URIs are caused by viruses. A virus is a type of germ and can spread from one person to another.  RISKS FACTORS You may be at risk for a URI if:   You smoke.   You have chronic heart or lung disease.  You have a weakened defense (immune) system.   You are very young or very old.   You have nasal allergies or asthma.  You work in crowded or poorly ventilated areas.  You work in health care facilities or schools. SIGNS AND SYMPTOMS  Symptoms typically develop 2-3 days after you come in contact with a cold virus. Most viral URIs last 7-10 days. However, viral URIs from the influenza virus (flu virus) can last 14-18 days and are typically more severe. Symptoms may include:   Runny or stuffy (congested) nose.   Sneezing.   Cough.   Sore throat.   Headache.   Fatigue.   Fever.   Loss of appetite.   Pain in your forehead, behind your eyes, and over your cheekbones (sinus pain).  Muscle aches.  DIAGNOSIS  Your health care provider may diagnose a URI by:  Physical exam.  Tests to check that your symptoms are not due to  another condition such as:  Strep throat.  Sinusitis.  Pneumonia.  Asthma. TREATMENT  A URI goes away on its own with time. It cannot be cured with medicines, but medicines may be prescribed or recommended to relieve symptoms. Medicines may help:  Reduce your fever.  Reduce your cough.  Relieve nasal congestion. HOME CARE INSTRUCTIONS   Take medicines only as directed by your health care provider.   Gargle warm saltwater or take cough drops to comfort your throat as directed by your health care provider.  Use a warm mist humidifier or inhale steam from a shower to increase air moisture. This may make it easier to breathe.  Drink enough fluid to keep your urine clear or pale yellow.   Eat soups and other clear broths and maintain good nutrition.   Rest as needed.   Return to work when your temperature has returned to normal or as your health care provider advises. You may need to stay home longer to avoid infecting others. You can also use a face mask and careful hand washing to prevent spread of the virus.  Increase the usage of your inhaler if you have asthma.   Do not use any tobacco products, including cigarettes, chewing tobacco, or electronic cigarettes. If you need help quitting, ask your health care provider. PREVENTION  The best way to protect yourself from getting a cold is to practice good hygiene.   Avoid oral or hand contact with people with cold   symptoms.   Wash your hands often if contact occurs.  There is no clear evidence that vitamin C, vitamin E, echinacea, or exercise reduces the chance of developing a cold. However, it is always recommended to get plenty of rest, exercise, and practice good nutrition.  SEEK MEDICAL CARE IF:   You are getting worse rather than better.   Your symptoms are not controlled by medicine.   You have chills.  You have worsening shortness of breath.  You have brown or red mucus.  You have yellow or brown nasal  discharge.  You have pain in your face, especially when you bend forward.  You have a fever.  You have swollen neck glands.  You have pain while swallowing.  You have white areas in the back of your throat. SEEK IMMEDIATE MEDICAL CARE IF:   You have severe or persistent:  Headache.  Ear pain.  Sinus pain.  Chest pain.  You have chronic lung disease and any of the following:  Wheezing.  Prolonged cough.  Coughing up blood.  A change in your usual mucus.  You have a stiff neck.  You have changes in your:  Vision.  Hearing.  Thinking.  Mood. MAKE SURE YOU:   Understand these instructions.  Will watch your condition.  Will get help right away if you are not doing well or get worse.   This information is not intended to replace advice given to you by your health care provider. Make sure you discuss any questions you have with your health care provider.   Document Released: 05/04/2001 Document Revised: 03/25/2015 Document Reviewed: 02/13/2014 Elsevier Interactive Patient Education 2016 Elsevier Inc.  

## 2016-04-11 DIAGNOSIS — M25552 Pain in left hip: Secondary | ICD-10-CM | POA: Diagnosis not present

## 2016-04-11 DIAGNOSIS — N390 Urinary tract infection, site not specified: Secondary | ICD-10-CM | POA: Diagnosis not present

## 2016-04-11 DIAGNOSIS — R109 Unspecified abdominal pain: Secondary | ICD-10-CM | POA: Diagnosis not present

## 2016-04-12 ENCOUNTER — Telehealth: Payer: Self-pay | Admitting: Physician Assistant

## 2016-04-12 DIAGNOSIS — R972 Elevated prostate specific antigen [PSA]: Secondary | ICD-10-CM | POA: Diagnosis not present

## 2016-04-12 DIAGNOSIS — N400 Enlarged prostate without lower urinary tract symptoms: Secondary | ICD-10-CM | POA: Diagnosis not present

## 2016-04-12 NOTE — Telephone Encounter (Signed)
Can be reached: [phone]   Reason for call: Pt states he went to urgent care yesterday (on [address] in [place]). He said they told him he has bone spurs on his hip causing pain. Pt asked what to do to get more information on this. Requesting call back.

## 2016-04-13 NOTE — Telephone Encounter (Signed)
Called and spoke with the pt and he stated that he was seen at urgent care on Sunday for hip pain.   They did a x-ray of his hips and stated that he has bone spurs.  He wants to know what is the next steps to taking care of the bone spurs.  Pt stated should he get the x-ray from urgent care and bring it to [place].  Informed the pt he can get the records from urgent care and then schedule an appt with [facility] and go over the information and see what the next step can be.  Pt verbalized understanding and agreed.//AB/CMA

## 2016-04-15 ENCOUNTER — Ambulatory Visit (INDEPENDENT_AMBULATORY_CARE_PROVIDER_SITE_OTHER): Payer: Medicare Other | Admitting: Cardiovascular Disease

## 2016-04-15 ENCOUNTER — Telehealth: Payer: Self-pay | Admitting: Physician Assistant

## 2016-04-15 ENCOUNTER — Encounter: Payer: Self-pay | Admitting: Cardiovascular Disease

## 2016-04-15 VITALS — BP 180/106 | HR 79 | Ht 68.5 in | Wt 215.0 lb

## 2016-04-15 DIAGNOSIS — I10 Essential (primary) hypertension: Secondary | ICD-10-CM

## 2016-04-15 NOTE — Telephone Encounter (Signed)
Eft message for patient to call to schedule follow up visit with C.Martin to discuss medications that he has been taken off of. Wife agreed to have patient call to schedule appointment.

## 2016-04-15 NOTE — Patient Instructions (Signed)
Medication Instructions:  Your physician recommends that you continue on your current medications as directed. Please refer to the Current Medication list given to you today.   Labwork: none  Testing/Procedures: none  Follow-Up: Your physician recommends that you schedule a follow-up appointment as needed with Dr. Acie Fredrickson   Any Other Special Instructions Will Be Listed Below (If Applicable).     If you need a refill on your cardiac medications before your next appointment, please call your pharmacy.

## 2016-04-15 NOTE — Telephone Encounter (Signed)
Patient left Radiology Report from [facility] for [place] to review. Placed in [place] box to be delivered to provider. Patient states that he just left his Cardiologist and was taken off of several medications. Wants to know if he needs to schedule a follow up with C. Hassell Done.

## 2016-04-15 NOTE — Progress Notes (Signed)
Cardiology Office Note   Date:  04/15/2016   ID:  Patrick Roth, DOB 01-Oct-1946, MRN [id number]  PCP:  Leeanne Rio, PA-C  Cardiologist:   Mertie Moores, MD   Chief Complaint  Patient presents with  . Follow-up    HTN, hyperlipidemia   Problem List 1. CVA - 2008  2. Dyspnea 3. Essential Hypertension 4. Hyperlipidemia    History of Present Illness: Patrick Roth is a 70 y.o. male who presents for some dyspnea  He has had HTN for years.  Recently has had some dyspnea - thinks its related to his BP meds.   Has tried multiple BP meds .  Intolerant to multiple meds.   Has seen Dr. Johnsie Cancel in the past.  Eats a low salt diet - for years.  Formerly worked as a Games developer .   Oct. 11, 2016:  Patrick Roth is seen for follow up  Still has significant HTN Is intolerent to many meds  , does not recall any specific allergies to specific meds.   10/13/2015: Patrick Roth is seen today for follow-up of his high blood pressure. He had a stress Myoview study. Myoview studies showed no ischemia. Study was not gated. Doing well on the Aldactone 100 mg tabs.  04/15/2016:  Patrick Roth continues to have problems with HTN Has multiple drug intolerances  Is having lots of hip pain  Has stopped the Doxazosin and the Terazosin himself. He cut his aldactone to 50 mg a day  Does complain of sore breasts   Past Medical History  Diagnosis Date  . Hypertension   . Stroke [facility]) 2008  . Hyperlipidemia   . Colon polyps   . Sciatica   . Internal hemorrhoids   . History of chicken pox     Past Surgical History  Procedure Laterality Date  . Great toe surgery    . Prostate biopsy    . Colonoscopy  2011     Current Outpatient Prescriptions  Medication Sig Dispense Refill  . aspirin 81 MG chewable tablet Chew 81 mg by mouth daily.    Marland Kitchen doxazosin (CARDURA) 2 MG tablet Take 2 mg by mouth daily.    Marland Kitchen spironolactone (ALDACTONE) 100 MG tablet Take 0.5 tablets (50 mg total) by mouth daily. 30  tablet 11  . terazosin (HYTRIN) 5 MG capsule Take 1 capsule (5 mg total) by mouth every evening. (Patient taking differently: Take 5 mg by mouth every evening. Taking 1/2 tablet by mouth daily.) 31 capsule 11   No current facility-administered medications for this visit.    Allergies:   Atenolol; Crestor; Losartan; Amlodipine; Clonidine; Hydralazine; Hydrocodone-acetaminophen; Levofloxacin; Olmesartan; and Other    Social History:  The patient  reports that he has never smoked. He has never used smokeless tobacco. He reports that he does not drink alcohol or use illicit drugs.   Family History:  The patient's family history includes Healthy in his brother and sister; Heart disease in his brother and father; Hypertension (age of onset: 56) in his father; Lung cancer (age of onset: 68) in his mother; Lupus in his daughter; Other in his daughter. There is no history of Diabetes, Heart attack, Hyperlipidemia, or Sudden death.    ROS:  Please see the history of present illness.    Review of Systems: Constitutional:  denies fever, chills, diaphoresis, appetite change and fatigue.  HEENT: denies photophobia, eye pain, redness, hearing loss, ear pain, congestion, sore throat, rhinorrhea, sneezing, neck pain, neck stiffness and tinnitus.  Respiratory: denies SOB, DOE, cough, chest tightness, and wheezing.  Cardiovascular: denies chest pain, palpitations and leg swelling.  Gastrointestinal: denies nausea, vomiting, abdominal pain, diarrhea, constipation, blood in stool.  Genitourinary: denies dysuria, urgency, frequency, hematuria, flank pain and difficulty urinating.  Musculoskeletal: denies  myalgias, back pain, joint swelling, arthralgias and gait problem.   Skin: denies pallor, rash and wound.  Neurological: denies dizziness, seizures, syncope, weakness, light-headedness, numbness and headaches.   Hematological: denies adenopathy, easy bruising, personal or family bleeding history.    Psychiatric/ Behavioral: denies suicidal ideation, mood changes, confusion, nervousness, sleep disturbance and agitation.       All other systems are reviewed and negative.    PHYSICAL EXAM: VS:  BP 180/106 mmHg  Pulse 79  Ht 5' 8.5" (1.74 m)  Wt 215 lb (97.523 kg)  BMI 32.21 kg/m2 , BMI Body mass index is 32.21 kg/(m^2). GEN: Well nourished, well developed, in no acute distress HEENT: normal Neck: no JVD, carotid bruits, or masses Cardiac: RRR with occasional premature beats ; no murmurs, rubs, or gallops,no edema  Respiratory:  clear to auscultation bilaterally, normal work of breathing GI: soft, nontender, nondistended, + BS MS: no deformity or atrophy Skin: warm and dry, no rash Neuro:  Strength and sensation are intact Psych: normal   EKG:  EKG is ordered today. The ekg ordered 8/31  demonstrates NSR , RBBB, frequent PACs    Recent Labs: 08/06/2015: TSH 0.89 01/29/2016: ALT 21; Pro B Natriuretic peptide (BNP) 152.0* 04/08/2016: BUN 13; Creatinine, Ser 1.40*; Hemoglobin 15.2; Platelets 151; Potassium 3.5; Sodium 138    Lipid Panel    Component Value Date/Time   CHOL 209* 02/13/2016 1113   TRIG 71.0 02/13/2016 1113   HDL 52.90 02/13/2016 1113   CHOLHDL 4 02/13/2016 1113   VLDL 14.2 02/13/2016 1113   LDLCALC 142* 02/13/2016 1113      Wt Readings from Last 3 Encounters:  04/15/16 215 lb (97.523 kg)  04/08/16 210 lb (95.255 kg)  02/13/16 210 lb (95.255 kg)      Other studies Reviewed: Additional studies/ records that were reviewed today include: . Review of the above records demonstrates:    ASSESSMENT AND PLAN:  1.  Hypertension:  He's been on Aldactone 100 mg a day .  He cut the dose to 50 on his own. He stopped the Hytrin and the Doxazosin on his own. At this point, I dont have any further medications to offer. I 've suggested that he continue with the aldactone and increase the dose if tolerated.   He will need to follow up with his primary MD       Current medicines are reviewed at length with the patient today.  The patient does not have concerns regarding medicines.  The following changes have been made:  no change  Labs/ tests ordered today include:  No orders of the defined types were placed in this encounter.    Disposition:   FU with me in as needed.    Mertie Moores, MD  04/15/2016 9:39 AM    [address], [place], [place]  [address] Phone: [phone]; Fax: [phone]   [facility]  [address] [place], [place]  [address] [phone]   Fax [phone]

## 2016-04-16 NOTE — Telephone Encounter (Signed)
Patient called back and appointment scheduled.

## 2016-04-21 ENCOUNTER — Ambulatory Visit (INDEPENDENT_AMBULATORY_CARE_PROVIDER_SITE_OTHER): Payer: Medicare Other | Admitting: Physician Assistant

## 2016-04-21 ENCOUNTER — Ambulatory Visit (HOSPITAL_BASED_OUTPATIENT_CLINIC_OR_DEPARTMENT_OTHER)
Admission: RE | Admit: 2016-04-21 | Discharge: 2016-04-21 | Disposition: A | Discharge status: Home / Self Care | Payer: Medicare Other | Source: Ambulatory Visit | Attending: Physician Assistant | Admitting: Physician Assistant

## 2016-04-21 ENCOUNTER — Telehealth: Payer: Self-pay | Admitting: *Deleted

## 2016-04-21 ENCOUNTER — Encounter: Payer: Self-pay | Admitting: Physician Assistant

## 2016-04-21 VITALS — BP 162/104 | HR 83 | Temp 98.3°F | Resp 16 | Ht 69.0 in | Wt 211.2 lb

## 2016-04-21 DIAGNOSIS — J00 Acute nasopharyngitis [common cold]: Secondary | ICD-10-CM

## 2016-04-21 DIAGNOSIS — R399 Unspecified symptoms and signs involving the genitourinary system: Secondary | ICD-10-CM | POA: Diagnosis not present

## 2016-04-21 DIAGNOSIS — M160 Bilateral primary osteoarthritis of hip: Secondary | ICD-10-CM | POA: Diagnosis not present

## 2016-04-21 DIAGNOSIS — R300 Dysuria: Secondary | ICD-10-CM | POA: Diagnosis not present

## 2016-04-21 DIAGNOSIS — B9689 Other specified bacterial agents as the cause of diseases classified elsewhere: Secondary | ICD-10-CM

## 2016-04-21 DIAGNOSIS — I10 Essential (primary) hypertension: Secondary | ICD-10-CM | POA: Diagnosis not present

## 2016-04-21 DIAGNOSIS — J208 Acute bronchitis due to other specified organisms: Principal | ICD-10-CM

## 2016-04-21 DIAGNOSIS — R0989 Other specified symptoms and signs involving the circulatory and respiratory systems: Secondary | ICD-10-CM | POA: Diagnosis present

## 2016-04-21 DIAGNOSIS — J90 Pleural effusion, not elsewhere classified: Secondary | ICD-10-CM | POA: Insufficient documentation

## 2016-04-21 DIAGNOSIS — I517 Cardiomegaly: Secondary | ICD-10-CM | POA: Diagnosis not present

## 2016-04-21 LAB — POCT URINALYSIS DIPSTICK
BILIRUBIN UA: NEGATIVE
GLUCOSE UA: NEGATIVE
KETONES UA: NEGATIVE
Leukocytes, UA: NEGATIVE
NITRITE UA: NEGATIVE
Protein, UA: NEGATIVE
RBC UA: NEGATIVE
Spec Grav, UA: 1.015
Urobilinogen, UA: 0.2
pH, UA: 6

## 2016-04-21 MED ORDER — CELECOXIB 100 MG PO CAPS: 100.0000 mg | ORAL_CAPSULE | Freq: Every day | ORAL | Status: DC

## 2016-04-21 MED ORDER — CEFDINIR 300 MG PO CAPS: 300.0000 mg | ORAL_CAPSULE | Freq: Two times a day (BID) | ORAL | Status: DC

## 2016-04-21 NOTE — Progress Notes (Signed)
Patient presents to clinic today for ER follow-up of acute bronchitis (seen 04/08/16). Was evaluated with negative labs and CXR. Was started on Azithromycin which he has completed. Denies improvement in symptoms with medication. Is still having chest congestion and productive cough. Endorses intermittent fevers. Denies chest pain, recent travel or sick contact.   Patient also presents for follow-up of UC visit (outside system) for hip pain. Endorses having imaging obtained at visit revealing bone spurs. Has had prior imaging here (02/13/16 - ) that revealed arthritis changes without mention of bone spurs. Has history of allergy to multiple pain medications so treatment has been limited.   Patient also c/o urinary urgency and dysuria x 1 week. Denies urinary hesitancy or rectal pain.  Denies hematuria. Denies back pain, nausea or vomiting. Has history of UTIs in the past. Is not currently sexually active.    Past Medical History  Diagnosis Date  . Hypertension   . Stroke [facility]) 2008  . Hyperlipidemia   . Colon polyps   . Sciatica   . Internal hemorrhoids   . History of chicken pox     Current Outpatient Prescriptions on File Prior to Visit  Medication Sig Dispense Refill  . aspirin 81 MG chewable tablet Chew 81 mg by mouth daily.    Marland Kitchen doxazosin (CARDURA) 2 MG tablet Take 2 mg by mouth daily. Reported on 04/15/2016    . spironolactone (ALDACTONE) 100 MG tablet Take 0.5 tablets (50 mg total) by mouth daily. 30 tablet 11   No current facility-administered medications on file prior to visit.    Allergies  Allergen Reactions  . Atenolol Other (See Comments)    Drowsiness and "flu sxs"  . Crestor [Rosuvastatin Calcium] Shortness Of Breath    Muscle cramps  . Losartan Other (See Comments)  . Terazosin Shortness Of Breath and Palpitations    Nerve pain  . Amlodipine Other (See Comments)  . Clonidine Other (See Comments)  . Hydralazine Other (See Comments)    Joint swelling  .  Hydrocodone-Acetaminophen Other (See Comments)  . Levofloxacin Other (See Comments)  . Olmesartan Other (See Comments)  . Other Other (See Comments)    Bystolic causing GI Issues    Family History  Problem Relation Age of Onset  . Hypertension Father 91    Deceased  . Heart disease Father   . Diabetes Neg Hx   . Heart attack Neg Hx   . Hyperlipidemia Neg Hx   . Sudden death Neg Hx   . Lung cancer Mother 69    Deceased  . Healthy Sister     x3  . Healthy Brother     x4  . Heart disease Brother     #5  . Other Daughter     Alpha Thalassemia  . Lupus Daughter     #2-deceased    Social History   Social History  . Marital Status: Married    Spouse Name: N/A  . Number of Children: 1  . Years of Education: 12   Occupational History  . Retired    Social History Main Topics  . Smoking status: Never Smoker   . Smokeless tobacco: Never Used  . Alcohol Use: No  . Drug Use: No  . Sexual Activity: Not Asked   Other Topics Concern  . None   Social History Narrative   Fun: Play golf and build furniture.    Review of Systems - See HPI.  All other ROS are negative.  BP 162/104 mmHg  Pulse 83  Temp(Src) 98.3 F (36.8 C) (Oral)  Resp 16  Ht 5' 9"  (1.753 m)  Wt 211 lb 4 oz (95.822 kg)  BMI 31.18 kg/m2  SpO2 99%  Physical Exam  Constitutional: He is oriented to person, place, and time and well-developed, well-nourished, and in no distress.  HENT:  Head: Normocephalic and atraumatic.  Right Ear: External ear normal.  Left Ear: External ear normal.  Nose: Nose normal.  Mouth/Throat: Oropharynx is clear and moist. No oropharyngeal exudate.  TM within normal limits bilaterally.  Eyes: Conjunctivae are normal.  Neck: Neck supple.  Cardiovascular: Normal rate, regular rhythm, normal heart sounds and intact distal pulses.   Pulmonary/Chest: Effort normal and breath sounds normal. No respiratory distress. He has no wheezes. He has no rales. He exhibits no tenderness.    Abdominal: Soft. Bowel sounds are normal. He exhibits no distension. There is no tenderness.  Lymphadenopathy:    He has no cervical adenopathy.  Neurological: He is alert and oriented to person, place, and time.  Skin: Skin is warm and dry. No rash noted.  Psychiatric: Affect normal.     Recent Results (from the past 2160 hour(s))  D-Dimer, Quantitative     Status: None   Collection Time: 01/29/16  2:56 PM  Result Value Ref Range   D-Dimer, Quant 0.29 0.00 - 0.48 ug/mL-FEU    Comment: At the inhouse established cutoff value of 0.48 ug/mL FEU, this methology has been documented in the literature to have a sensitivity and negative predictive value of at least 98-99%.  The test result should be correlated with an assessment of the clinical probability of DVT/VTE.   B Nat Peptide     Status: Abnormal   Collection Time: 01/29/16  2:56 PM  Result Value Ref Range   Pro B Natriuretic peptide (BNP) 152.0 (H) 0.0 - 100.0 pg/mL  CBC w/Diff     Status: Abnormal   Collection Time: 01/29/16  3:07 PM  Result Value Ref Range   WBC 4.3 4.0 - 10.5 K/uL   RBC 5.08 4.22 - 5.81 Mil/uL   Hemoglobin 14.6 13.0 - 17.0 g/dL   HCT 44.2 39.0 - 52.0 %   MCV 86.9 78.0 - 100.0 fl   MCHC 33.1 30.0 - 36.0 g/dL   RDW 14.1 11.5 - 15.5 %   Platelets 139.0 (L) 150.0 - 400.0 K/uL   Neutrophils Relative % 54.9 43.0 - 77.0 %   Lymphocytes Relative 30.2 12.0 - 46.0 %   Monocytes Relative 11.9 3.0 - 12.0 %   Eosinophils Relative 2.4 0.0 - 5.0 %   Basophils Relative 0.6 0.0 - 3.0 %   Neutro Abs 2.3 1.4 - 7.7 K/uL   Lymphs Abs 1.3 0.7 - 4.0 K/uL   Monocytes Absolute 0.5 0.1 - 1.0 K/uL   Eosinophils Absolute 0.1 0.0 - 0.7 K/uL   Basophils Absolute 0.0 0.0 - 0.1 K/uL  Comprehensive metabolic panel     Status: None   Collection Time: 01/29/16  3:07 PM  Result Value Ref Range   Sodium 139 135 - 145 mEq/L   Potassium 4.1 3.5 - 5.1 mEq/L   Chloride 102 96 - 112 mEq/L   CO2 32 19 - 32 mEq/L   Glucose, Bld 90 70  - 99 mg/dL   BUN 16 6 - 23 mg/dL   Creatinine, Ser 1.27 0.40 - 1.50 mg/dL   Total Bilirubin 1.2 0.2 - 1.2 mg/dL   Alkaline Phosphatase 60 39 - 117 U/L  AST 25 0 - 37 U/L   ALT 21 0 - 53 U/L   Total Protein 7.4 6.0 - 8.3 g/dL   Albumin 4.2 3.5 - 5.2 g/dL   Calcium 9.5 8.4 - 10.5 mg/dL   GFR 71.90 >60.00 mL/min  CBC     Status: Abnormal   Collection Time: 02/13/16 11:13 AM  Result Value Ref Range   WBC 4.2 4.0 - 10.5 K/uL   RBC 5.29 4.22 - 5.81 Mil/uL   Platelets 140.0 (L) 150.0 - 400.0 K/uL   Hemoglobin 15.2 13.0 - 17.0 g/dL   HCT 46.3 39.0 - 52.0 %   MCV 87.5 78.0 - 100.0 fl   MCHC 32.7 30.0 - 36.0 g/dL   RDW 14.0 11.5 - 81.7 %  Basic Metabolic Panel (BMET)     Status: None   Collection Time: 02/13/16 11:13 AM  Result Value Ref Range   Sodium 140 135 - 145 mEq/L   Potassium 3.7 3.5 - 5.1 mEq/L   Chloride 102 96 - 112 mEq/L   CO2 31 19 - 32 mEq/L   Glucose, Bld 98 70 - 99 mg/dL   BUN 22 6 - 23 mg/dL   Creatinine, Ser 1.24 0.40 - 1.50 mg/dL   Calcium 9.6 8.4 - 10.5 mg/dL   GFR 73.90 >60.00 mL/min  Sed Rate (ESR)     Status: None   Collection Time: 02/13/16 11:13 AM  Result Value Ref Range   Sed Rate 11 0 - 22 mm/hr  Lipid Profile     Status: Abnormal   Collection Time: 02/13/16 11:13 AM  Result Value Ref Range   Cholesterol 209 (H) 0 - 200 mg/dL    Comment: ATP III Classification       Desirable:  < 200 mg/dL               Borderline High:  200 - 239 mg/dL          High:  > = 240 mg/dL   Triglycerides 71.0 0.0 - 149.0 mg/dL    Comment: Normal:  <150 mg/dLBorderline High:  150 - 199 mg/dL   HDL 52.90 >39.00 mg/dL   VLDL 14.2 0.0 - 40.0 mg/dL   LDL Cholesterol 142 (H) 0 - 99 mg/dL   Total CHOL/HDL Ratio 4     Comment:                Men          Women1/2 Average Risk     3.4          3.3Average Risk          5.0          4.42X Average Risk          9.6          7.13X Average Risk          15.0          11.0                       NonHDL 155.88     Comment: NOTE:   Non-HDL goal should be 30 mg/dL higher than patient's LDL goal (i.e. LDL goal of < 70 mg/dL, would have non-HDL goal of < 100 mg/dL)  CBC with Differential/Platelet     Status: None   Collection Time: 04/08/16  9:20 PM  Result Value Ref Range   WBC 6.1 4.0 - 10.5 K/uL   RBC 5.20 4.22 -  5.81 MIL/uL   Hemoglobin 15.2 13.0 - 17.0 g/dL   HCT 45.3 39.0 - 52.0 %   MCV 87.1 78.0 - 100.0 fL   MCH 29.2 26.0 - 34.0 pg   MCHC 33.6 30.0 - 36.0 g/dL   RDW 13.4 11.5 - 15.5 %   Platelets 151 150 - 400 K/uL   Neutrophils Relative % 69 %   Neutro Abs 4.2 1.7 - 7.7 K/uL   Lymphocytes Relative 16 %   Lymphs Abs 1.0 0.7 - 4.0 K/uL   Monocytes Relative 13 %   Monocytes Absolute 0.8 0.1 - 1.0 K/uL   Eosinophils Relative 2 %   Eosinophils Absolute 0.1 0.0 - 0.7 K/uL   Basophils Relative 0 %   Basophils Absolute 0.0 0.0 - 0.1 K/uL  Basic metabolic panel     Status: Abnormal   Collection Time: 04/08/16  9:20 PM  Result Value Ref Range   Sodium 138 135 - 145 mmol/L   Potassium 3.5 3.5 - 5.1 mmol/L   Chloride 103 101 - 111 mmol/L   CO2 29 22 - 32 mmol/L   Glucose, Bld 109 (H) 65 - 99 mg/dL   BUN 13 6 - 20 mg/dL   Creatinine, Ser 1.40 (H) 0.61 - 1.24 mg/dL   Calcium 9.0 8.9 - 10.3 mg/dL   GFR calc non Af Amer 49 (L) >60 mL/min   GFR calc Af Amer 57 (L) >60 mL/min    Comment: (NOTE) The eGFR has been calculated using the CKD EPI equation. This calculation has not been validated in all clinical situations. eGFR's persistently <60 mL/min signify possible Chronic Kidney Disease.    Anion gap 6 5 - 15    Assessment/Plan: 1. Acute bacterial bronchitis Rx Cefdinir due to recurrence of symptoms. Will obtain repeat Xray today. Supportive measures reviewed. Will alter regimen based on results. - DG Chest 2 View; Future - cefdinir (OMNICEF) 300 MG capsule; Take 1 capsule (300 mg total) by mouth 2 (two) times daily.  Dispense: 14 capsule; Refill: 0  2. Dysuria Urine dip negative. Will send for culture.  Cefdnir bronchitis will provide some coverage until culture is in. - POCT urinalysis dipstick; Standing - CULTURE, URINE COMPREHENSIVE - POCT urinalysis dipstick  3. Primary osteoarthritis of both hips Cannot tolerate most meds. Will attempt trial of Celebrex 100 mg daily. FU scheduled. PT recommended. Patient declines at present.  4. Essential hypertension Elevated as at previous visits. Patient is very noncompliant with BP medications. Is followed by hypertension specialist with Cardiology. At recent visit MD noted he had nothing further to offer as patient is taking medications as he pleases and is non compliant. Encouraged patient to take medications as directed.  5. UTI symptoms Urine dip negative. Urine culture sent.  - POCT urinalysis dipstick; Standing - CULTURE, URINE COMPREHENSIVE - POCT urinalysis dipstick

## 2016-04-21 NOTE — Telephone Encounter (Signed)
PA initiated on [email or web address] ([place]), awaiting determination. JG//CMA

## 2016-04-21 NOTE — Progress Notes (Signed)
Pre visit review using our clinic review tool, if applicable. No additional management support is needed unless otherwise documented below in the visit note/SLS  

## 2016-04-21 NOTE — Patient Instructions (Signed)
Please go downstairs for chest x-ray. I will call with your results. I will also call with the results of your urine culture once I have them.  Please make sure to increase fluids to help flush out any bacteria in the urinary tract.  Please make sure to take BP medications daily as directed.   Give some thought to Physical Therapy and a GI consult  Take antibiotic (Cefdinir) as directed.  Increase fluids.  Get plenty of rest. Use Plain Mucinex for congestion. Delsym for cough. Take a daily probiotic (I recommend Align or Culturelle, but even Activia Yogurt may be beneficial).  A humidifier placed in the bedroom may offer some relief for a dry, scratchy throat of nasal irritation.  Read information below on acute bronchitis. Please call or return to clinic if symptoms are not improving.  Acute Bronchitis Bronchitis is when the airways that extend from the windpipe into the lungs get red, puffy, and painful (inflamed). Bronchitis often causes thick spit (mucus) to develop. This leads to a cough. A cough is the most common symptom of bronchitis. In acute bronchitis, the condition usually begins suddenly and goes away over time (usually in 2 weeks). Smoking, allergies, and asthma can make bronchitis worse. Repeated episodes of bronchitis may cause more lung problems.  HOME CARE  Rest.  Drink enough fluids to keep your pee (urine) clear or pale yellow (unless you need to limit fluids as told by your doctor).  Only take over-the-counter or prescription medicines as told by your doctor.  Avoid smoking and secondhand smoke. These can make bronchitis worse. If you are a smoker, think about using nicotine gum or skin patches. Quitting smoking will help your lungs heal faster.  Reduce the chance of getting bronchitis again by:  Washing your hands often.  Avoiding people with cold symptoms.  Trying not to touch your hands to your mouth, nose, or eyes.  Follow up with your doctor as told.  GET  HELP IF: Your symptoms do not improve after 1 week of treatment. Symptoms include:  Cough.  Fever.  Coughing up thick spit.  Body aches.  Chest congestion.  Chills.  Shortness of breath.  Sore throat.  GET HELP RIGHT AWAY IF:   You have an increased fever.  You have chills.  You have severe shortness of breath.  You have bloody thick spit (sputum).  You throw up (vomit) often.  You lose too much body fluid (dehydration).  You have a severe headache.  You faint.  MAKE SURE YOU:   Understand these instructions.  Will watch your condition.  Will get help right away if you are not doing well or get worse. Document Released: 04/26/2008 Document Revised: 07/11/2013 Document Reviewed: 05/01/2013 [facility] Patient Information 2015 [place], [place]. This information is not intended to replace advice given to you by your health care provider. Make sure you discuss any questions you have with your health care provider.

## 2016-04-22 NOTE — Telephone Encounter (Signed)
VM from [facility] with [facility], nonformulary exemption request was approved and good from 03/24/16 for 1 year. Call at [phone] if needed.

## 2016-04-22 NOTE — Telephone Encounter (Signed)
PA approved.

## 2016-04-23 ENCOUNTER — Other Ambulatory Visit: Payer: Self-pay | Admitting: *Deleted

## 2016-04-23 LAB — CULTURE, URINE COMPREHENSIVE
COLONY COUNT: NO GROWTH
ORGANISM ID, BACTERIA: NO GROWTH

## 2016-04-23 MED ORDER — FUROSEMIDE 20 MG PO TABS: 20.0000 mg | ORAL_TABLET | Freq: Every day | ORAL | Status: DC

## 2016-04-23 NOTE — Telephone Encounter (Signed)
I believe they need the approval letter

## 2016-04-23 NOTE — Telephone Encounter (Signed)
Relationship to patient: Can be reached: [phone] Pharmacy: [facility] [address], [place] [address]  Reason for call: Pt states that [facility] called him stating PA for celebrex was approved. Pt calling for [place] to fwd to pharmacy. Pt states this was a new med he is to try.

## 2016-04-23 NOTE — Telephone Encounter (Signed)
Forwarded what to pharmacy? Approval letter or script?

## 2016-04-29 NOTE — Telephone Encounter (Signed)
Approval letter faxed to pharmacy at [phone] successfully. JG//CMA

## 2016-04-30 ENCOUNTER — Encounter: Payer: Self-pay | Admitting: Physician Assistant

## 2016-04-30 ENCOUNTER — Ambulatory Visit (INDEPENDENT_AMBULATORY_CARE_PROVIDER_SITE_OTHER): Payer: Medicare Other | Admitting: Physician Assistant

## 2016-04-30 VITALS — BP 165/111 | HR 65 | Temp 98.2°F | Resp 16 | Ht 69.0 in | Wt 206.4 lb

## 2016-04-30 DIAGNOSIS — B9689 Other specified bacterial agents as the cause of diseases classified elsewhere: Secondary | ICD-10-CM

## 2016-04-30 DIAGNOSIS — F430 Acute stress reaction: Secondary | ICD-10-CM | POA: Diagnosis not present

## 2016-04-30 DIAGNOSIS — J00 Acute nasopharyngitis [common cold]: Secondary | ICD-10-CM | POA: Diagnosis not present

## 2016-04-30 DIAGNOSIS — J208 Acute bronchitis due to other specified organisms: Principal | ICD-10-CM

## 2016-04-30 MED ORDER — CEFDINIR 300 MG PO CAPS: 300.0000 mg | ORAL_CAPSULE | Freq: Two times a day (BID) | ORAL | Status: DC

## 2016-04-30 MED ORDER — TRAZODONE HCL 50 MG PO TABS: 25.0000 mg | ORAL_TABLET | Freq: Every evening | ORAL | Status: DC | PRN

## 2016-04-30 NOTE — Progress Notes (Signed)
Patient presents to clinic today for follow-up of bronchitis and small pleural effusion noted on CXR obtained at last visit. Patient was started on Lasix and ABX. Endorses taking antibiotic as directed but did not pick up or start the other medication. Endorses he is feeling much better. Denies continued chest congestion, cough or fatigue. Denies PND or orthopnea. Denies leg swelling. Is followed by Cardiology with Echocardiogram obtained 10/23/15 revealing normal LV systolic function, mild diastolic dysfunction and mild pulmonary hypertension.   Patient endorses significant stressors at home with his wife. States these issues are causing anxiety and restlessness. Sleep has been affected. He and his wife will be starting couples counseling to work through some marital issues. Denies depressed mood, anhedonia, SI/HI. Would like to discuss options to help with sleep.   Past Medical History  Diagnosis Date  . Hypertension   . Stroke [facility]) 2008  . Hyperlipidemia   . Colon polyps   . Sciatica   . Internal hemorrhoids   . History of chicken pox     Current Outpatient Prescriptions on File Prior to Visit  Medication Sig Dispense Refill  . spironolactone (ALDACTONE) 100 MG tablet Take 0.5 tablets (50 mg total) by mouth daily. 30 tablet 11  . celecoxib (CELEBREX) 100 MG capsule Take 1 capsule (100 mg total) by mouth daily. (Patient not taking: Reported on 04/30/2016) 30 capsule 0  . furosemide (LASIX) 20 MG tablet Take 1 tablet (20 mg total) by mouth daily. (Patient not taking: Reported on 04/30/2016) 7 tablet 0   No current facility-administered medications on file prior to visit.    Allergies  Allergen Reactions  . Atenolol Other (See Comments)    Drowsiness and "flu sxs"  . Crestor [Rosuvastatin Calcium] Shortness Of Breath    Muscle cramps  . Losartan Other (See Comments)  . Terazosin Shortness Of Breath and Palpitations    Nerve pain  . Amlodipine Other (See Comments)  . Clonidine  Other (See Comments)  . Hydralazine Other (See Comments)    Joint swelling  . Hydrocodone-Acetaminophen Other (See Comments)  . Levofloxacin Other (See Comments)  . Olmesartan Other (See Comments)  . Other Other (See Comments)    Bystolic causing GI Issues    Family History  Problem Relation Age of Onset  . Hypertension Father 39    Deceased  . Heart disease Father   . Diabetes Neg Hx   . Heart attack Neg Hx   . Hyperlipidemia Neg Hx   . Sudden death Neg Hx   . Lung cancer Mother 85    Deceased  . Healthy Sister     x3  . Healthy Brother     x4  . Heart disease Brother     #5  . Other Daughter     Alpha Thalassemia  . Lupus Daughter     #2-deceased    Social History   Social History  . Marital Status: Married    Spouse Name: N/A  . Number of Children: 1  . Years of Education: 12   Occupational History  . Retired    Social History Main Topics  . Smoking status: Never Smoker   . Smokeless tobacco: Never Used  . Alcohol Use: No  . Drug Use: No  . Sexual Activity: Not Asked   Other Topics Concern  . None   Social History Narrative   Fun: Play golf and build furniture.     Review of Systems - See HPI.  All other ROS  are negative.  BP 165/111 mmHg  Pulse 65  Temp(Src) 98.2 F (36.8 C) (Oral)  Resp 16  Ht 5' 9"  (1.753 m)  Wt 206 lb 6 oz (93.611 kg)  BMI 30.46 kg/m2  SpO2 100%  Physical Exam  Constitutional: He is oriented to person, place, and time and well-developed, well-nourished, and in no distress.  HENT:  Head: Normocephalic and atraumatic.  Eyes: Conjunctivae are normal.  Cardiovascular: Normal rate, regular rhythm, normal heart sounds and intact distal pulses.   Pulses:      Dorsalis pedis pulses are 2+ on the right side, and 2+ on the left side.       Posterior tibial pulses are 2+ on the left side.  Bilateral lower extremities with symmetrical pulses. No evidence of edema on examination.  Pulmonary/Chest: Effort normal and breath  sounds normal. No respiratory distress. He has no wheezes. He has no rales. He exhibits no tenderness.  Abdominal: Soft.  Neurological: He is alert and oriented to person, place, and time.  Skin: Skin is warm and dry. No rash noted.  Psychiatric: Affect normal.  Vitals reviewed.   Recent Results (from the past 2160 hour(s))  CBC     Status: Abnormal   Collection Time: 02/13/16 11:13 AM  Result Value Ref Range   WBC 4.2 4.0 - 10.5 K/uL   RBC 5.29 4.22 - 5.81 Mil/uL   Platelets 140.0 (L) 150.0 - 400.0 K/uL   Hemoglobin 15.2 13.0 - 17.0 g/dL   HCT 46.3 39.0 - 52.0 %   MCV 87.5 78.0 - 100.0 fl   MCHC 32.7 30.0 - 36.0 g/dL   RDW 14.0 11.5 - 63.7 %  Basic Metabolic Panel (BMET)     Status: None   Collection Time: 02/13/16 11:13 AM  Result Value Ref Range   Sodium 140 135 - 145 mEq/L   Potassium 3.7 3.5 - 5.1 mEq/L   Chloride 102 96 - 112 mEq/L   CO2 31 19 - 32 mEq/L   Glucose, Bld 98 70 - 99 mg/dL   BUN 22 6 - 23 mg/dL   Creatinine, Ser 1.24 0.40 - 1.50 mg/dL   Calcium 9.6 8.4 - 10.5 mg/dL   GFR 73.90 >60.00 mL/min  Sed Rate (ESR)     Status: None   Collection Time: 02/13/16 11:13 AM  Result Value Ref Range   Sed Rate 11 0 - 22 mm/hr  Lipid Profile     Status: Abnormal   Collection Time: 02/13/16 11:13 AM  Result Value Ref Range   Cholesterol 209 (H) 0 - 200 mg/dL    Comment: ATP III Classification       Desirable:  < 200 mg/dL               Borderline High:  200 - 239 mg/dL          High:  > = 240 mg/dL   Triglycerides 71.0 0.0 - 149.0 mg/dL    Comment: Normal:  <150 mg/dLBorderline High:  150 - 199 mg/dL   HDL 52.90 >39.00 mg/dL   VLDL 14.2 0.0 - 40.0 mg/dL   LDL Cholesterol 142 (H) 0 - 99 mg/dL   Total CHOL/HDL Ratio 4     Comment:                Men          Women1/2 Average Risk     3.4          3.3Average Risk  5.0          4.42X Average Risk          9.6          7.13X Average Risk          15.0          11.0                       NonHDL 155.88     Comment:  NOTE:  Non-HDL goal should be 30 mg/dL higher than patient's LDL goal (i.e. LDL goal of < 70 mg/dL, would have non-HDL goal of < 100 mg/dL)  CBC with Differential/Platelet     Status: None   Collection Time: 04/08/16  9:20 PM  Result Value Ref Range   WBC 6.1 4.0 - 10.5 K/uL   RBC 5.20 4.22 - 5.81 MIL/uL   Hemoglobin 15.2 13.0 - 17.0 g/dL   HCT 45.3 39.0 - 52.0 %   MCV 87.1 78.0 - 100.0 fL   MCH 29.2 26.0 - 34.0 pg   MCHC 33.6 30.0 - 36.0 g/dL   RDW 13.4 11.5 - 15.5 %   Platelets 151 150 - 400 K/uL   Neutrophils Relative % 69 %   Neutro Abs 4.2 1.7 - 7.7 K/uL   Lymphocytes Relative 16 %   Lymphs Abs 1.0 0.7 - 4.0 K/uL   Monocytes Relative 13 %   Monocytes Absolute 0.8 0.1 - 1.0 K/uL   Eosinophils Relative 2 %   Eosinophils Absolute 0.1 0.0 - 0.7 K/uL   Basophils Relative 0 %   Basophils Absolute 0.0 0.0 - 0.1 K/uL  Basic metabolic panel     Status: Abnormal   Collection Time: 04/08/16  9:20 PM  Result Value Ref Range   Sodium 138 135 - 145 mmol/L   Potassium 3.5 3.5 - 5.1 mmol/L   Chloride 103 101 - 111 mmol/L   CO2 29 22 - 32 mmol/L   Glucose, Bld 109 (H) 65 - 99 mg/dL   BUN 13 6 - 20 mg/dL   Creatinine, Ser 1.40 (H) 0.61 - 1.24 mg/dL   Calcium 9.0 8.9 - 10.3 mg/dL   GFR calc non Af Amer 49 (L) >60 mL/min   GFR calc Af Amer 57 (L) >60 mL/min    Comment: (NOTE) The eGFR has been calculated using the CKD EPI equation. This calculation has not been validated in all clinical situations. eGFR's persistently <60 mL/min signify possible Chronic Kidney Disease.    Anion gap 6 5 - 15  POCT urinalysis dipstick     Status: None   Collection Time: 04/21/16 11:48 AM  Result Value Ref Range   Color, UA yellow    Clarity, UA clear    Glucose, UA neg    Bilirubin, UA neg    Ketones, UA neg    Spec Grav, UA 1.015    Blood, UA neg    pH, UA 6.0    Protein, UA neg    Urobilinogen, UA 0.2    Nitrite, UA neg    Leukocytes, UA Negative Negative  CULTURE, URINE COMPREHENSIVE      Status: None   Collection Time: 04/21/16  2:10 PM  Result Value Ref Range   Colony Count NO GROWTH    Organism ID, Bacteria NO GROWTH     Assessment/Plan: 1. Acute bacterial bronchitis Patient feeling much better. Supportive measures reviewed. Giving history of noted pulmonary HTN on echo and small effusion noted  on CXR, recommend he schedule follow-up with Cardiology for further discussion. Declines Furosemide therapy. Lungs CTAB.  2. Acute stress reaction Will begin trial of Trazodone to help with sleep. Counseling as scheduled. FU scheduled.

## 2016-04-30 NOTE — Patient Instructions (Signed)
Please pick up medications and take as directed.   The Trazodone will help with mood and sleep. Take the first dose after supper. If tolerating well, can take the next doses closer to bedtime.   Follow-up with me in 3-4 weeks.  Please take your BP medications as directed. They need to be increased but you have refused.

## 2016-04-30 NOTE — Progress Notes (Signed)
Pre visit review using our clinic review tool, if applicable. No additional management support is needed unless otherwise documented below in the visit note/SLS  

## 2016-05-04 DIAGNOSIS — F331 Major depressive disorder, recurrent, moderate: Secondary | ICD-10-CM | POA: Diagnosis not present

## 2016-05-07 ENCOUNTER — Telehealth: Payer: Self-pay | Admitting: Physician Assistant

## 2016-05-07 ENCOUNTER — Ambulatory Visit (INDEPENDENT_AMBULATORY_CARE_PROVIDER_SITE_OTHER): Payer: Medicare Other | Admitting: Physician Assistant

## 2016-05-07 ENCOUNTER — Encounter: Payer: Self-pay | Admitting: Physician Assistant

## 2016-05-07 VITALS — BP 110/53 | HR 60 | Temp 98.3°F | Resp 16 | Ht 69.0 in | Wt 205.2 lb

## 2016-05-07 DIAGNOSIS — I4949 Other premature depolarization: Secondary | ICD-10-CM | POA: Diagnosis not present

## 2016-05-07 DIAGNOSIS — R195 Other fecal abnormalities: Secondary | ICD-10-CM

## 2016-05-07 LAB — COMPREHENSIVE METABOLIC PANEL
ALBUMIN: 3.8 g/dL (ref 3.6–5.1)
ALT: 32 U/L (ref 9–46)
AST: 52 U/L — AB (ref 10–35)
Alkaline Phosphatase: 64 U/L (ref 40–115)
BILIRUBIN TOTAL: 0.8 mg/dL (ref 0.2–1.2)
BUN: 14 mg/dL (ref 7–25)
CO2: 30 mmol/L (ref 20–31)
CREATININE: 1.22 mg/dL — AB (ref 0.70–1.18)
Calcium: 9.2 mg/dL (ref 8.6–10.3)
Chloride: 102 mmol/L (ref 98–110)
GLUCOSE: 93 mg/dL (ref 65–99)
Potassium: 4.2 mmol/L (ref 3.5–5.3)
SODIUM: 137 mmol/L (ref 135–146)
Total Protein: 6.6 g/dL (ref 6.1–8.1)

## 2016-05-07 LAB — CBC
HCT: 43.8 % (ref 38.5–50.0)
HEMOGLOBIN: 14.4 g/dL (ref 13.2–17.1)
MCH: 28.9 pg (ref 27.0–33.0)
MCHC: 32.9 g/dL (ref 32.0–36.0)
MCV: 87.8 fL (ref 80.0–100.0)
MPV: 11.2 fL (ref 7.5–12.5)
PLATELETS: 182 10*3/uL (ref 140–400)
RBC: 4.99 MIL/uL (ref 4.20–5.80)
RDW: 13.7 % (ref 11.0–15.0)
WBC: 4.2 10*3/uL (ref 3.8–10.8)

## 2016-05-07 NOTE — Progress Notes (Signed)
Patient presents to clinic today c/o loose stools over the past week. Endorses 2 loose, non-bloody stools per day. Endorses some mild nausea and abdominal cramping initially that have resolved. Patient denies recent travel or sick contact. Denies change in stress level. Was recently placed on Celebrex and is wondering if that is a contributing factor. Denies tenesmus, melena or hematochezia. Has been tolerating his regular diet without issue.  Past Medical History  Diagnosis Date  . Hypertension   . Stroke [facility]) 2008  . Hyperlipidemia   . Colon polyps   . Sciatica   . Internal hemorrhoids   . History of chicken pox     Current Outpatient Prescriptions on File Prior to Visit  Medication Sig Dispense Refill  . aspirin 81 MG tablet Take 81 mg by mouth daily.    . celecoxib (CELEBREX) 100 MG capsule Take 1 capsule (100 mg total) by mouth daily. 30 capsule 0  . furosemide (LASIX) 20 MG tablet Take 1 tablet (20 mg total) by mouth daily. 7 tablet 0  . Multiple Vitamins-Minerals (MENS MULTIVITAMIN PLUS) TABS Take by mouth daily. Reported on 04/30/2016    . spironolactone (ALDACTONE) 100 MG tablet Take 0.5 tablets (50 mg total) by mouth daily. 30 tablet 11  . traZODone (DESYREL) 50 MG tablet Take 0.5 tablets (25 mg total) by mouth at bedtime as needed for sleep. 30 tablet 0   No current facility-administered medications on file prior to visit.    Allergies  Allergen Reactions  . Atenolol Other (See Comments)    Drowsiness and "flu sxs"  . Crestor [Rosuvastatin Calcium] Shortness Of Breath    Muscle cramps  . Losartan Other (See Comments)  . Terazosin Shortness Of Breath and Palpitations    Nerve pain  . Amlodipine Other (See Comments)  . Clonidine Other (See Comments)  . Hydralazine Other (See Comments)    Joint swelling  . Hydrocodone-Acetaminophen Other (See Comments)  . Levofloxacin Other (See Comments)  . Olmesartan Other (See Comments)  . Other Other (See Comments)   Bystolic causing GI Issues    Family History  Problem Relation Age of Onset  . Hypertension Father 41    Deceased  . Heart disease Father   . Diabetes Neg Hx   . Heart attack Neg Hx   . Hyperlipidemia Neg Hx   . Sudden death Neg Hx   . Lung cancer Mother 78    Deceased  . Healthy Sister     x3  . Healthy Brother     x4  . Heart disease Brother     #5  . Other Daughter     Alpha Thalassemia  . Lupus Daughter     #2-deceased    Social History   Social History  . Marital Status: Married    Spouse Name: N/A  . Number of Children: 1  . Years of Education: 12   Occupational History  . Retired    Social History Main Topics  . Smoking status: Never Smoker   . Smokeless tobacco: Never Used  . Alcohol Use: No  . Drug Use: No  . Sexual Activity: Not Asked   Other Topics Concern  . None   Social History Narrative   Fun: Play golf and build furniture.    Review of Systems - See HPI.  All other ROS are negative.  BP 110/53 mmHg  Pulse 60  Temp(Src) 98.3 F (36.8 C) (Oral)  Resp 16  Ht _0  (1.753 m)  Wt 205 lb 4 oz (93.101 kg)  BMI 30.30 kg/m2  SpO2 100%  Physical Exam  Constitutional: He is oriented to person, place, and time and well-developed, well-nourished, and in no distress.  HENT:  Head: Normocephalic and atraumatic.  Eyes: Conjunctivae are normal.  Cardiovascular: Normal rate, normal heart sounds and intact distal pulses.   Occasional extrasystoles are present.  Pulmonary/Chest: Effort normal and breath sounds normal. No respiratory distress. He has no wheezes. He has no rales. He exhibits no tenderness.  Abdominal: Soft. Bowel sounds are normal. He exhibits no distension and no mass. There is no tenderness. There is no rebound and no guarding.  Neurological: He is alert and oriented to person, place, and time.  Skin: Skin is warm and dry. No rash noted.  Psychiatric: Affect normal.  Vitals reviewed.   Recent Results (from the past 2160 hour(s))   CBC     Status: Abnormal   Collection Time: 02/13/16 11:13 AM  Result Value Ref Range   WBC 4.2 4.0 - 10.5 K/uL   RBC 5.29 4.22 - 5.81 Mil/uL   Platelets 140.0 (L) 150.0 - 400.0 K/uL   Hemoglobin 15.2 13.0 - 17.0 g/dL   HCT 46.3 39.0 - 52.0 %   MCV 87.5 78.0 - 100.0 fl   MCHC 32.7 30.0 - 36.0 g/dL   RDW 14.0 11.5 - 23.3 %  Basic Metabolic Panel (BMET)     Status: None   Collection Time: 02/13/16 11:13 AM  Result Value Ref Range   Sodium 140 135 - 145 mEq/L   Potassium 3.7 3.5 - 5.1 mEq/L   Chloride 102 96 - 112 mEq/L   CO2 31 19 - 32 mEq/L   Glucose, Bld 98 70 - 99 mg/dL   BUN 22 6 - 23 mg/dL   Creatinine, Ser 1.24 0.40 - 1.50 mg/dL   Calcium 9.6 8.4 - 10.5 mg/dL   GFR 73.90 >60.00 mL/min  Sed Rate (ESR)     Status: None   Collection Time: 02/13/16 11:13 AM  Result Value Ref Range   Sed Rate 11 0 - 22 mm/hr  Lipid Profile     Status: Abnormal   Collection Time: 02/13/16 11:13 AM  Result Value Ref Range   Cholesterol 209 (H) 0 - 200 mg/dL    Comment: ATP III Classification       Desirable:  < 200 mg/dL               Borderline High:  200 - 239 mg/dL          High:  > = 240 mg/dL   Triglycerides 71.0 0.0 - 149.0 mg/dL    Comment: Normal:  <150 mg/dLBorderline High:  150 - 199 mg/dL   HDL 52.90 >39.00 mg/dL   VLDL 14.2 0.0 - 40.0 mg/dL   LDL Cholesterol 142 (H) 0 - 99 mg/dL   Total CHOL/HDL Ratio 4     Comment:                Men          Women1/2 Average Risk     3.4          3.3Average Risk          5.0          4.42X Average Risk          9.6          7.13X Average Risk          15.0  11.0                       NonHDL 155.88     Comment: NOTE:  Non-HDL goal should be 30 mg/dL higher than patient's LDL goal (i.e. LDL goal of < 70 mg/dL, would have non-HDL goal of < 100 mg/dL)  CBC with Differential/Platelet     Status: None   Collection Time: 04/08/16  9:20 PM  Result Value Ref Range   WBC 6.1 4.0 - 10.5 K/uL   RBC 5.20 4.22 - 5.81 MIL/uL   Hemoglobin 15.2 13.0  - 17.0 g/dL   HCT 45.3 39.0 - 52.0 %   MCV 87.1 78.0 - 100.0 fL   MCH 29.2 26.0 - 34.0 pg   MCHC 33.6 30.0 - 36.0 g/dL   RDW 13.4 11.5 - 15.5 %   Platelets 151 150 - 400 K/uL   Neutrophils Relative % 69 %   Neutro Abs 4.2 1.7 - 7.7 K/uL   Lymphocytes Relative 16 %   Lymphs Abs 1.0 0.7 - 4.0 K/uL   Monocytes Relative 13 %   Monocytes Absolute 0.8 0.1 - 1.0 K/uL   Eosinophils Relative 2 %   Eosinophils Absolute 0.1 0.0 - 0.7 K/uL   Basophils Relative 0 %   Basophils Absolute 0.0 0.0 - 0.1 K/uL  Basic metabolic panel     Status: Abnormal   Collection Time: 04/08/16  9:20 PM  Result Value Ref Range   Sodium 138 135 - 145 mmol/L   Potassium 3.5 3.5 - 5.1 mmol/L   Chloride 103 101 - 111 mmol/L   CO2 29 22 - 32 mmol/L   Glucose, Bld 109 (H) 65 - 99 mg/dL   BUN 13 6 - 20 mg/dL   Creatinine, Ser 1.40 (H) 0.61 - 1.24 mg/dL   Calcium 9.0 8.9 - 10.3 mg/dL   GFR calc non Af Amer 49 (L) >60 mL/min   GFR calc Af Amer 57 (L) >60 mL/min    Comment: (NOTE) The eGFR has been calculated using the CKD EPI equation. This calculation has not been validated in all clinical situations. eGFR's persistently <60 mL/min signify possible Chronic Kidney Disease.    Anion gap 6 5 - 15  POCT urinalysis dipstick     Status: None   Collection Time: 04/21/16 11:48 AM  Result Value Ref Range   Color, UA yellow    Clarity, UA clear    Glucose, UA neg    Bilirubin, UA neg    Ketones, UA neg    Spec Grav, UA 1.015    Blood, UA neg    pH, UA 6.0    Protein, UA neg    Urobilinogen, UA 0.2    Nitrite, UA neg    Leukocytes, UA Negative Negative  CULTURE, URINE COMPREHENSIVE     Status: None   Collection Time: 04/21/16  2:10 PM  Result Value Ref Range   Colony Count NO GROWTH    Organism ID, Bacteria NO GROWTH     Assessment/Plan: 1. Extrasystole Occasional delay between beats noted on rhythm strip. PAC noted but infrequent. Cardiology doc-on-call consulted and reviewed strip. Denies any concerning  findings. Will continue current medication regimen. Patient to FU with cardiology as scheduled.  - EKG 12-Lead - Urinalysis, Routine w reflex microscopic (not at [facility]); Future  2. Loose stools Exam unremarkable. Question resolving mild viral gastroenteritis versus side effect to Celebrex. BRAT diet reviewed. Supportive measures reviewed. Patient to hold off on  Celebrex for a few days to see if this has an effect. Tylenol for pain. He is to call early next week to let us know how he is feeling. - CBC - Comp Met (CMET) - Urinalysis, Routine w reflex microscopic (not at [facility]); Future   Leeanne Rio, PA-C

## 2016-05-07 NOTE — Telephone Encounter (Signed)
°  Relationship to patient: Self Can be reached: [phone]   Pharmacy:  [facility] [address] [place] [address] [phone] (Phone) [phone] (Fax)        Reason for call: Request a refill on the medication that he was given for his pain. States he does not know which medication is for the arthritis pain.

## 2016-05-07 NOTE — Telephone Encounter (Signed)
Called and spoke with the pt and he stated that he wanted to know if the Celecoxib 100mg  is the medication Patrick Roth gave him for arthritis.  Informed the pt I checked the office note and yes Patrick Roth gave him the Celecoxib for his hip pain.  Pt stated that he just wanted to know because he wants to go play golf for father's day.//AB/CMA

## 2016-05-07 NOTE — Patient Instructions (Signed)
Please go to the lab for blood work. I will call with your results. Stop the Celebrex over the weekend. If this help then symptoms are side effect of medication.  Follow the diet below. If anything recurs and is severe, please go to the ER.  Food Choices to Help Relieve Diarrhea, Adult When you have diarrhea, the foods you eat and your eating habits are very important. Choosing the right foods and drinks can help relieve diarrhea. Also, because diarrhea can last up to 7 days, you need to replace lost fluids and electrolytes (such as sodium, potassium, and chloride) in order to help prevent dehydration.  WHAT GENERAL GUIDELINES DO I NEED TO FOLLOW?  Slowly drink 1 cup (8 oz) of fluid for each episode of diarrhea. If you are getting enough fluid, your urine will be clear or pale yellow.  Eat starchy foods. Some good choices include white rice, white toast, pasta, low-fiber cereal, baked potatoes (without the skin), saltine crackers, and bagels.  Avoid large servings of any cooked vegetables.  Limit fruit to two servings per day. A serving is  cup or 1 small piece.  Choose foods with less than 2 g of fiber per serving.  Limit fats to less than 8 tsp (38 g) per day.  Avoid fried foods.  Eat foods that have probiotics in them. Probiotics can be found in certain dairy products.  Avoid foods and beverages that may increase the speed at which food moves through the stomach and intestines (gastrointestinal tract). Things to avoid include:  High-fiber foods, such as dried fruit, raw fruits and vegetables, nuts, seeds, and whole grain foods.  Spicy foods and high-fat foods.  Foods and beverages sweetened with high-fructose corn syrup, honey, or sugar alcohols such as xylitol, sorbitol, and mannitol. WHAT FOODS ARE RECOMMENDED? Grains White rice. White, [place], or pita breads (fresh or toasted), including plain rolls, buns, or bagels. White pasta. Saltine, soda, or graham crackers. Pretzels.  Low-fiber cereal. Cooked cereals made with water (such as cornmeal, farina, or cream cereals). Plain muffins. Matzo. Melba toast. Zwieback.  Vegetables Potatoes (without the skin). Strained tomato and vegetable juices. Most well-cooked and canned vegetables without seeds. Tender lettuce. Fruits Cooked or canned applesauce, apricots, cherries, fruit cocktail, grapefruit, peaches, pears, or plums. Fresh bananas, apples without skin, cherries, grapes, cantaloupe, grapefruit, peaches, oranges, or plums.  Meat and Other Protein Products Baked or boiled chicken. Eggs. Tofu. Fish. Seafood. Smooth peanut butter. Ground or well-cooked tender beef, ham, veal, lamb, pork, or poultry.  Dairy Plain yogurt, kefir, and unsweetened liquid yogurt. Lactose-free milk, buttermilk, or soy milk. Plain hard cheese. Beverages Sport drinks. Clear broths. Diluted fruit juices (except prune). Regular, caffeine-free sodas such as ginger ale. Water. Decaffeinated teas. Oral rehydration solutions. Sugar-free beverages not sweetened with sugar alcohols. Other Bouillon, broth, or soups made from recommended foods.  The items listed above may not be a complete list of recommended foods or beverages. Contact your dietitian for more options. WHAT FOODS ARE NOT RECOMMENDED? Grains Whole grain, whole wheat, bran, or rye breads, rolls, pastas, crackers, and cereals. Wild or brown rice. Cereals that contain more than 2 g of fiber per serving. Corn tortillas or taco shells. Cooked or dry oatmeal. Granola. Popcorn. Vegetables Raw vegetables. Cabbage, broccoli, Brussels sprouts, artichokes, baked beans, beet greens, corn, kale, legumes, peas, sweet potatoes, and yams. Potato skins. Cooked spinach and cabbage. Fruits Dried fruit, including raisins and dates. Raw fruits. Stewed or dried prunes. Fresh apples with skin, apricots, mangoes, pears,  raspberries, and strawberries.  Meat and Other Protein Products Chunky peanut butter. Nuts and  seeds. Beans and lentils. Berniece Salines.  Dairy High-fat cheeses. Milk, chocolate milk, and beverages made with milk, such as milk shakes. Cream. Ice cream. Sweets and Desserts Sweet rolls, doughnuts, and sweet breads. Pancakes and waffles. Fats and Oils Butter. Cream sauces. Margarine. Salad oils. Plain salad dressings. Olives. Avocados.  Beverages Caffeinated beverages (such as coffee, tea, soda, or energy drinks). Alcoholic beverages. Fruit juices with pulp. Prune juice. Soft drinks sweetened with high-fructose corn syrup or sugar alcohols. Other Coconut. Hot sauce. Chili powder. Mayonnaise. Gravy. Cream-based or milk-based soups.  The items listed above may not be a complete list of foods and beverages to avoid. Contact your dietitian for more information. WHAT SHOULD I DO IF I BECOME DEHYDRATED? Diarrhea can sometimes lead to dehydration. Signs of dehydration include dark urine and dry mouth and skin. If you think you are dehydrated, you should rehydrate with an oral rehydration solution. These solutions can be purchased at pharmacies, retail stores, or online.  Drink -1 cup (120-240 mL) of oral rehydration solution each time you have an episode of diarrhea. If drinking this amount makes your diarrhea worse, try drinking smaller amounts more often. For example, drink 1-3 tsp (5-15 mL) every 5-10 minutes.  A general rule for staying hydrated is to drink 1-2 L of fluid per day. Talk to your health care provider about the specific amount you should be drinking each day. Drink enough fluids to keep your urine clear or pale yellow.   This information is not intended to replace advice given to you by your health care provider. Make sure you discuss any questions you have with your health care provider.   Document Released: 01/29/2004 Document Revised: 11/29/2014 Document Reviewed: 10/01/2013 Elsevier Interactive Patient Education [employer].

## 2016-05-10 ENCOUNTER — Telehealth: Payer: Self-pay | Admitting: Physician Assistant

## 2016-05-10 ENCOUNTER — Other Ambulatory Visit (INDEPENDENT_AMBULATORY_CARE_PROVIDER_SITE_OTHER): Payer: Medicare Other

## 2016-05-10 DIAGNOSIS — I4949 Other premature depolarization: Secondary | ICD-10-CM

## 2016-05-10 DIAGNOSIS — R195 Other fecal abnormalities: Secondary | ICD-10-CM | POA: Diagnosis not present

## 2016-05-10 DIAGNOSIS — F331 Major depressive disorder, recurrent, moderate: Secondary | ICD-10-CM | POA: Diagnosis not present

## 2016-05-10 LAB — URINALYSIS, ROUTINE W REFLEX MICROSCOPIC
Bilirubin Urine: NEGATIVE
HGB URINE DIPSTICK: NEGATIVE
KETONES UR: NEGATIVE
Leukocytes, UA: NEGATIVE
NITRITE: NEGATIVE
RBC / HPF: NONE SEEN (ref 0–?)
Specific Gravity, Urine: 1.015 (ref 1.000–1.030)
Total Protein, Urine: NEGATIVE
URINE GLUCOSE: NEGATIVE
UROBILINOGEN UA: 0.2 (ref 0.0–1.0)
pH: 5.5 (ref 5.0–8.0)

## 2016-05-10 NOTE — Telephone Encounter (Signed)
Please advise.//AB/CMA 

## 2016-05-10 NOTE — Telephone Encounter (Signed)
Pt called back for Angie and stated he saw [name] in the lobby downstairs today and he is going to try Celebrex again to see if it helps him. He will call us back if his stomach bothers him again.

## 2016-05-10 NOTE — Telephone Encounter (Signed)
Called and [facility] @ 11:05am @ [phone]) asking the pt to RTC regarding the message below.//AB/CMA

## 2016-05-10 NOTE — Telephone Encounter (Signed)
Has history of significant OA of joint. Had previously recommended treatments that he declined. Was taking Celebrex but we were holding due to mention of decreased appetite to see if anything changed. If he did not see a difference with holding medication, then he can restart to help with pain. If symptoms are not improving, he needs to be seen.  Also please assess for any bowel/bladder incontinence, inability to use restroom or numbness of groin -- these are alarm symptoms and he would need to go to the ER.

## 2016-05-10 NOTE — Addendum Note (Signed)
Addended by: Harl Bowie on: 05/10/2016 01:28 PM   Modules accepted: Orders

## 2016-05-10 NOTE — Telephone Encounter (Signed)
°  Relationship to patient: Self Can be reached: [phone]   Reason for call: Patient request a call back to find out if he needs to come in to see Provider. States that he is having pain from his hip to his plevis and is now going to his toes.

## 2016-05-11 NOTE — Telephone Encounter (Signed)
Patrick Roth at 05/10/2016 8:51 AM     Status: Signed       Expand All Collapse All    Relationship to patient: Self Can be reached: [phone]   Reason for call: Patient request a call back to find out if he needs to come in to see Provider. States that he is having pain from his hip to his plevis and is now going to his toes.        LMOM with contact name and number for return call RE: call back on pain & medication inquiry/SLS 06/20

## 2016-05-11 NOTE — Telephone Encounter (Addendum)
Can be reached: [phone]  Reason for call: Pt called stating that the Celebrex is not helping pain. He said that he started taking it again yesterday. Pt states that nipples are still sore from heart med that he was taking before. He hasn't taken it for a few weeks. He isn't sure if he should keep taking Celebrex and asking what to do about nipple soreness.

## 2016-05-11 NOTE — Telephone Encounter (Signed)
Patient states he is having numbness & tingling in leg & foot with pain; per provider, scheduled patient appt for Wed, 05/12/16 at 5:00pm. Also, to discuss "nipple soreness" from BP medications, pt should further discuss with Cardiology; understood & agreed/SLS 06/21

## 2016-05-11 NOTE — Telephone Encounter (Signed)
Patient returning your call best # [phone]

## 2016-05-12 ENCOUNTER — Telehealth: Payer: Self-pay | Admitting: Cardiovascular Disease

## 2016-05-12 ENCOUNTER — Ambulatory Visit (INDEPENDENT_AMBULATORY_CARE_PROVIDER_SITE_OTHER): Payer: Medicare Other | Admitting: Physician Assistant

## 2016-05-12 ENCOUNTER — Encounter: Payer: Self-pay | Admitting: Physician Assistant

## 2016-05-12 VITALS — BP 106/68 | HR 63 | Temp 98.2°F | Resp 16 | Ht 69.0 in | Wt 205.1 lb

## 2016-05-12 DIAGNOSIS — M5432 Sciatica, left side: Secondary | ICD-10-CM

## 2016-05-12 MED ORDER — METHYLPREDNISOLONE 4 MG PO TBPK: ORAL_TABLET | ORAL | Status: DC

## 2016-05-12 MED ORDER — GABAPENTIN 100 MG PO CAPS: 100.0000 mg | ORAL_CAPSULE | Freq: Three times a day (TID) | ORAL | Status: DC

## 2016-05-12 NOTE — Progress Notes (Signed)
Patient presents to clinic today c/o left sided low back and hip pain now radiating into LLE. Endorses this has been a recurrent issue for him over the past several years. Denies known trauma or injury. Recent x-ray of hip revealed bone spurs and arthritic changes. Patient has been taking Celebrex but this is not helping with current symptoms. Denies change to bowel movements or voiding. Denies saddle anesthesia.   Past Medical History  Diagnosis Date  . Hypertension   . Stroke [facility] 2008  . Hyperlipidemia   . Colon polyps   . Sciatica   . Internal hemorrhoids   . History of chicken pox     Current Outpatient Prescriptions on File Prior to Visit  Medication Sig Dispense Refill  . aspirin 81 MG tablet Take 81 mg by mouth daily.    . celecoxib (CELEBREX) 100 MG capsule Take 1 capsule (100 mg total) by mouth daily. 30 capsule 0  . furosemide (LASIX) 20 MG tablet Take 1 tablet (20 mg total) by mouth daily. 7 tablet 0  . Multiple Vitamins-Minerals (MENS MULTIVITAMIN PLUS) TABS Take by mouth daily. Reported on 04/30/2016    . spironolactone (ALDACTONE) 100 MG tablet Take 0.5 tablets (50 mg total) by mouth daily. 30 tablet 11  . traZODone (DESYREL) 50 MG tablet Take 0.5 tablets (25 mg total) by mouth at bedtime as needed for sleep. 30 tablet 0   No current facility-administered medications on file prior to visit.    Allergies  Allergen Reactions  . Atenolol Other (See Comments)    Drowsiness and "flu sxs"  . Crestor [Rosuvastatin Calcium] Shortness Of Breath    Muscle cramps  . Losartan Other (See Comments)  . Terazosin Shortness Of Breath and Palpitations    Nerve pain  . Amlodipine Other (See Comments)  . Clonidine Other (See Comments)  . Hydralazine Other (See Comments)    Joint swelling  . Hydrocodone-Acetaminophen Other (See Comments)  . Levofloxacin Other (See Comments)  . Olmesartan Other (See Comments)  . Other Other (See Comments)    Bystolic causing GI Issues     Family History  Problem Relation Age of Onset  . Hypertension Father 62    Deceased  . Heart disease Father   . Diabetes Neg Hx   . Heart attack Neg Hx   . Hyperlipidemia Neg Hx   . Sudden death Neg Hx   . Lung cancer Mother 41    Deceased  . Healthy Sister     x3  . Healthy Brother     x4  . Heart disease Brother     #5  . Other Daughter     Alpha Thalassemia  . Lupus Daughter     #2-deceased    Social History   Social History  . Marital Status: Married    Spouse Name: N/A  . Number of Children: 1  . Years of Education: 12   Occupational History  . Retired    Social History Main Topics  . Smoking status: Never Smoker   . Smokeless tobacco: Never Used  . Alcohol Use: No  . Drug Use: No  . Sexual Activity: Not Asked   Other Topics Concern  . None   Social History Narrative   Fun: Play golf and build furniture.     Review of Systems - See HPI.  All other ROS are negative.  BP 106/68 mmHg  Pulse 63  Temp(Src) 98.2 F (36.8 C) (Oral)  Resp 16  Ht 5'  9" (1.753 m)  Wt 205 lb 2 oz (93.044 kg)  BMI 30.28 kg/m2  SpO2 98%  Physical Exam  Constitutional: He is oriented to person, place, and time and well-developed, well-nourished, and in no distress.  HENT:  Head: Normocephalic and atraumatic.  Eyes: Conjunctivae are normal.  Cardiovascular: Normal rate, regular rhythm, normal heart sounds and intact distal pulses.   Pulmonary/Chest: Effort normal and breath sounds normal. No respiratory distress. He has no wheezes. He has no rales. He exhibits no tenderness.  Musculoskeletal:       Lumbar back: He exhibits pain. He exhibits normal range of motion, no tenderness and no bony tenderness.  Neurological: He is alert and oriented to person, place, and time.  Skin: Skin is warm and dry. No rash noted.  Psychiatric: Affect normal.  Vitals reviewed.   Recent Results (from the past 2160 hour(s))  CBC     Status: Abnormal   Collection Time: 02/13/16 11:13  AM  Result Value Ref Range   WBC 4.2 4.0 - 10.5 K/uL   RBC 5.29 4.22 - 5.81 Mil/uL   Platelets 140.0 (L) 150.0 - 400.0 K/uL   Hemoglobin 15.2 13.0 - 17.0 g/dL   HCT 46.3 39.0 - 52.0 %   MCV 87.5 78.0 - 100.0 fl   MCHC 32.7 30.0 - 36.0 g/dL   RDW 14.0 11.5 - 21.3 %  Basic Metabolic Panel (BMET)     Status: None   Collection Time: 02/13/16 11:13 AM  Result Value Ref Range   Sodium 140 135 - 145 mEq/L   Potassium 3.7 3.5 - 5.1 mEq/L   Chloride 102 96 - 112 mEq/L   CO2 31 19 - 32 mEq/L   Glucose, Bld 98 70 - 99 mg/dL   BUN 22 6 - 23 mg/dL   Creatinine, Ser 1.24 0.40 - 1.50 mg/dL   Calcium 9.6 8.4 - 10.5 mg/dL   GFR 73.90 >60.00 mL/min  Sed Rate (ESR)     Status: None   Collection Time: 02/13/16 11:13 AM  Result Value Ref Range   Sed Rate 11 0 - 22 mm/hr  Lipid Profile     Status: Abnormal   Collection Time: 02/13/16 11:13 AM  Result Value Ref Range   Cholesterol 209 (H) 0 - 200 mg/dL    Comment: ATP III Classification       Desirable:  < 200 mg/dL               Borderline High:  200 - 239 mg/dL          High:  > = 240 mg/dL   Triglycerides 71.0 0.0 - 149.0 mg/dL    Comment: Normal:  <150 mg/dLBorderline High:  150 - 199 mg/dL   HDL 52.90 >39.00 mg/dL   VLDL 14.2 0.0 - 40.0 mg/dL   LDL Cholesterol 142 (H) 0 - 99 mg/dL   Total CHOL/HDL Ratio 4     Comment:                Men          Women1/2 Average Risk     3.4          3.3Average Risk          5.0          4.42X Average Risk          9.6          7.13X Average Risk  15.0          11.0                       NonHDL 155.88     Comment: NOTE:  Non-HDL goal should be 30 mg/dL higher than patient's LDL goal (i.e. LDL goal of < 70 mg/dL, would have non-HDL goal of < 100 mg/dL)  CBC with Differential/Platelet     Status: None   Collection Time: 04/08/16  9:20 PM  Result Value Ref Range   WBC 6.1 4.0 - 10.5 K/uL   RBC 5.20 4.22 - 5.81 MIL/uL   Hemoglobin 15.2 13.0 - 17.0 g/dL   HCT 45.3 39.0 - 52.0 %   MCV 87.1 78.0 - 100.0  fL   MCH 29.2 26.0 - 34.0 pg   MCHC 33.6 30.0 - 36.0 g/dL   RDW 13.4 11.5 - 15.5 %   Platelets 151 150 - 400 K/uL   Neutrophils Relative % 69 %   Neutro Abs 4.2 1.7 - 7.7 K/uL   Lymphocytes Relative 16 %   Lymphs Abs 1.0 0.7 - 4.0 K/uL   Monocytes Relative 13 %   Monocytes Absolute 0.8 0.1 - 1.0 K/uL   Eosinophils Relative 2 %   Eosinophils Absolute 0.1 0.0 - 0.7 K/uL   Basophils Relative 0 %   Basophils Absolute 0.0 0.0 - 0.1 K/uL  Basic metabolic panel     Status: Abnormal   Collection Time: 04/08/16  9:20 PM  Result Value Ref Range   Sodium 138 135 - 145 mmol/L   Potassium 3.5 3.5 - 5.1 mmol/L   Chloride 103 101 - 111 mmol/L   CO2 29 22 - 32 mmol/L   Glucose, Bld 109 (H) 65 - 99 mg/dL   BUN 13 6 - 20 mg/dL   Creatinine, Ser 1.40 (H) 0.61 - 1.24 mg/dL   Calcium 9.0 8.9 - 10.3 mg/dL   GFR calc non Af Amer 49 (L) >60 mL/min   GFR calc Af Amer 57 (L) >60 mL/min    Comment: (NOTE) The eGFR has been calculated using the CKD EPI equation. This calculation has not been validated in all clinical situations. eGFR's persistently <60 mL/min signify possible Chronic Kidney Disease.    Anion gap 6 5 - 15  POCT urinalysis dipstick     Status: None   Collection Time: 04/21/16 11:48 AM  Result Value Ref Range   Color, UA yellow    Clarity, UA clear    Glucose, UA neg    Bilirubin, UA neg    Ketones, UA neg    Spec Grav, UA 1.015    Blood, UA neg    pH, UA 6.0    Protein, UA neg    Urobilinogen, UA 0.2    Nitrite, UA neg    Leukocytes, UA Negative Negative  CULTURE, URINE COMPREHENSIVE     Status: None   Collection Time: 04/21/16  2:10 PM  Result Value Ref Range   Colony Count NO GROWTH    Organism ID, Bacteria NO GROWTH   CBC     Status: None   Collection Time: 05/07/16  4:01 PM  Result Value Ref Range   WBC 4.2 3.8 - 10.8 K/uL   RBC 4.99 4.20 - 5.80 MIL/uL   Hemoglobin 14.4 13.2 - 17.1 g/dL   HCT 43.8 38.5 - 50.0 %   MCV 87.8 80.0 - 100.0 fL   MCH 28.9 27.0 - 33.0 pg  MCHC 32.9 32.0 - 36.0 g/dL   RDW 13.7 11.0 - 15.0 %   Platelets 182 140 - 400 K/uL   MPV 11.2 7.5 - 12.5 fL    Comment: ** Please note change in unit of measure and reference range(s). **  Comp Met (CMET)     Status: Abnormal   Collection Time: 05/07/16  4:01 PM  Result Value Ref Range   Sodium 137 135 - 146 mmol/L   Potassium 4.2 3.5 - 5.3 mmol/L   Chloride 102 98 - 110 mmol/L   CO2 30 20 - 31 mmol/L   Glucose, Bld 93 65 - 99 mg/dL   BUN 14 7 - 25 mg/dL   Creat 1.22 (H) 0.70 - 1.18 mg/dL    Comment:   For patients > or = 71 years of age: The upper reference limit for Creatinine is approximately 13% higher for people identified as African-American.      Total Bilirubin 0.8 0.2 - 1.2 mg/dL   Alkaline Phosphatase 64 40 - 115 U/L   AST 52 (H) 10 - 35 U/L   ALT 32 9 - 46 U/L   Total Protein 6.6 6.1 - 8.1 g/dL   Albumin 3.8 3.6 - 5.1 g/dL   Calcium 9.2 8.6 - 10.3 mg/dL  Urinalysis, Routine w reflex microscopic (not at [facility])     Status: None   Collection Time: 05/10/16  1:28 PM  Result Value Ref Range   Color, Urine YELLOW Yellow;Lt. Yellow   APPearance CLEAR Clear   Specific Gravity, Urine 1.015 1.000-1.030   pH 5.5 5.0 - 8.0   Total Protein, Urine NEGATIVE Negative   Urine Glucose NEGATIVE Negative   Ketones, ur NEGATIVE Negative   Bilirubin Urine NEGATIVE Negative   Hgb urine dipstick NEGATIVE Negative   Urobilinogen, UA 0.2 0.0 - 1.0   Leukocytes, UA NEGATIVE Negative   Nitrite NEGATIVE Negative   WBC, UA 0-2/hpf 0-2/hpf   RBC / HPF none seen 0-2/hpf   Squamous Epithelial / LPF Rare(0-4/hpf) Rare(0-4/hpf)    Assessment/Plan: 1. Left sided sciatica Will begin Medrol dose pack. Gabapentin as directed. Hold Celebrex while on Medrol. Stretches reviewed. Giving recurrence over the past few years, will get MRI to further assess.  - methylPREDNISolone (MEDROL DOSEPAK) 4 MG TBPK tablet; Take following package directions.  Dispense: 21 tablet; Refill: 0 - gabapentin  (NEURONTIN) 100 MG capsule; Take 1 capsule (100 mg total) by mouth 3 (three) times daily.  Dispense: 30 capsule; Refill: 3   Leeanne Rio, [place]

## 2016-05-12 NOTE — Telephone Encounter (Signed)
New message       Pt c/o medication issue:  1. Name of Medication: terazosin 2. How are you currently taking this medication (dosage and times per day)? 5mg  3. Are you having a reaction (difficulty breathing--STAT)? no 4. What is your medication issue? Pt states he has been off the medication for 1 month but is still having side effects.  Please call

## 2016-05-12 NOTE — Telephone Encounter (Signed)
Spoke with patient who states he continues to have breast tenderness despite stopping the doxazosin.  I advised him that the likely cause of the tenderness is the aldactone.  He states he thought Dr. Acie Fredrickson told him at the last office visit that it was caused by the doxazosin.  He states Elyn Aquas, NP advised him to call our office regarding changing this medication.  Patient states he is not going to take any more anti-hypertensives but he would like to continue the diuretic.  I advised him that Dr. Acie Fredrickson is out of the office this week but that I will send message to him for his review.  I advised that it may be Monday before I call him back.  He verbalized understanding and agreement and states the side effects are tolerable until that time.  He thanked me for the call.

## 2016-05-12 NOTE — Patient Instructions (Signed)
Please avoid heavy lifting or overexertion. Please start the steroid pack as directed. If tolerating well, start the Gabapentin tomorrow evening. Once symptoms are improving, start the exercises below.  You will be contacted to schedule an MRI.  Sciatica With Rehab The sciatic nerve runs from the back down the leg and is responsible for sensation and control of the muscles in the back (posterior) side of the thigh, lower leg, and foot. Sciatica is a condition that is characterized by inflammation of this nerve.  SYMPTOMS   Signs of nerve damage, including numbness and/or weakness along the posterior side of the lower extremity.  Pain in the back of the thigh that may also travel down the leg.  Pain that worsens when sitting for long periods of time.  Occasionally, pain in the back or buttock. CAUSES  Inflammation of the sciatic nerve is the cause of sciatica. The inflammation is due to something irritating the nerve. Common sources of irritation include:  Sitting for long periods of time.  Direct trauma to the nerve.  Arthritis of the spine.  Herniated or ruptured disk.  Slipping of the vertebrae (spondylolisthesis).  Pressure from soft tissues, such as muscles or ligament-like tissue (fascia). RISK INCREASES WITH:  Sports that place pressure or stress on the spine (football or weightlifting).  Poor strength and flexibility.  Failure to warm up properly before activity.  Family history of low back pain or disk disorders.  Previous back injury or surgery.  Poor body mechanics, especially when lifting, or poor posture. PREVENTION   Warm up and stretch properly before activity.  Maintain physical fitness:  Strength, flexibility, and endurance.  Cardiovascular fitness.  Learn and use proper technique, especially with posture and lifting. When possible, have coach correct improper technique.  Avoid activities that place stress on the spine. PROGNOSIS If treated  properly, then sciatica usually resolves within 6 weeks. However, occasionally surgery is necessary.  RELATED COMPLICATIONS   Permanent nerve damage, including pain, numbness, tingle, or weakness.  Chronic back pain.  Risks of surgery: infection, bleeding, nerve damage, or damage to surrounding tissues. TREATMENT Treatment initially involves resting from any activities that aggravate your symptoms. The use of ice and medication may help reduce pain and inflammation. The use of strengthening and stretching exercises may help reduce pain with activity. These exercises may be performed at home or with referral to a therapist. A therapist may recommend further treatments, such as transcutaneous electronic nerve stimulation (TENS) or ultrasound. Your caregiver may recommend corticosteroid injections to help reduce inflammation of the sciatic nerve. If symptoms persist despite non-surgical (conservative) treatment, then surgery may be recommended. MEDICATION  If pain medication is necessary, then nonsteroidal anti-inflammatory medications, such as aspirin and ibuprofen, or other minor pain relievers, such as acetaminophen, are often recommended.  Do not take pain medication for 7 days before surgery.  Prescription pain relievers may be given if deemed necessary by your caregiver. Use only as directed and only as much as you need.  Ointments applied to the skin may be helpful.  Corticosteroid injections may be given by your caregiver. These injections should be reserved for the most serious cases, because they may only be given a certain number of times. HEAT AND COLD  Cold treatment (icing) relieves pain and reduces inflammation. Cold treatment should be applied for 10 to 15 minutes every 2 to 3 hours for inflammation and pain and immediately after any activity that aggravates your symptoms. Use ice packs or massage the area with a  piece of ice (ice massage).  Heat treatment may be used prior to  performing the stretching and strengthening activities prescribed by your caregiver, physical therapist, or athletic trainer. Use a heat pack or soak the injury in warm water. SEEK MEDICAL CARE IF:  Treatment seems to offer no benefit, or the condition worsens.  Any medications produce adverse side effects. EXERCISES  RANGE OF MOTION (ROM) AND STRETCHING EXERCISES - Sciatica Most people with sciatic will find that their symptoms worsen with either excessive bending forward (flexion) or arching at the low back (extension). The exercises which will help resolve your symptoms will focus on the opposite motion. Your physician, physical therapist or athletic trainer will help you determine which exercises will be most helpful to resolve your low back pain. Do not complete any exercises without first consulting with your clinician. Discontinue any exercises which worsen your symptoms until you speak to your clinician. If you have pain, numbness or tingling which travels down into your buttocks, leg or foot, the goal of the therapy is for these symptoms to move closer to your back and eventually resolve. Occasionally, these leg symptoms will get better, but your low back pain may worsen; this is typically an indication of progress in your rehabilitation. Be certain to be very alert to any changes in your symptoms and the activities in which you participated in the 24 hours prior to the change. Sharing this information with your clinician will allow him/her to most efficiently treat your condition. These exercises may help you when beginning to rehabilitate your injury. Your symptoms may resolve with or without further involvement from your physician, physical therapist or athletic trainer. While completing these exercises, remember:   Restoring tissue flexibility helps normal motion to return to the joints. This allows healthier, less painful movement and activity.  An effective stretch should be held for at  least 30 seconds.  A stretch should never be painful. You should only feel a gentle lengthening or release in the stretched tissue. FLEXION RANGE OF MOTION AND STRETCHING EXERCISES: STRETCH - Flexion, Single Knee to Chest   Lie on a firm bed or floor with both legs extended in front of you.  Keeping one leg in contact with the floor, bring your opposite knee to your chest. Hold your leg in place by either grabbing behind your thigh or at your knee.  Pull until you feel a gentle stretch in your low back. Hold __________ seconds.  Slowly release your grasp and repeat the exercise with the opposite side. Repeat __________ times. Complete this exercise __________ times per day.  STRETCH - Flexion, Double Knee to Chest  Lie on a firm bed or floor with both legs extended in front of you.  Keeping one leg in contact with the floor, bring your opposite knee to your chest.  Tense your stomach muscles to support your back and then lift your other knee to your chest. Hold your legs in place by either grabbing behind your thighs or at your knees.  Pull both knees toward your chest until you feel a gentle stretch in your low back. Hold __________ seconds.  Tense your stomach muscles and slowly return one leg at a time to the floor. Repeat __________ times. Complete this exercise __________ times per day.  STRETCH - Low Trunk Rotation   Lie on a firm bed or floor. Keeping your legs in front of you, bend your knees so they are both pointed toward the ceiling and your feet are  flat on the floor.  Extend your arms out to the side. This will stabilize your upper body by keeping your shoulders in contact with the floor.  Gently and slowly drop both knees together to one side until you feel a gentle stretch in your low back. Hold for __________ seconds.  Tense your stomach muscles to support your low back as you bring your knees back to the starting position. Repeat the exercise to the other  side. Repeat __________ times. Complete this exercise __________ times per day  EXTENSION RANGE OF MOTION AND FLEXIBILITY EXERCISES: STRETCH - Extension, Prone on Elbows  Lie on your stomach on the floor, a bed will be too soft. Place your palms about shoulder width apart and at the height of your head.  Place your elbows under your shoulders. If this is too painful, stack pillows under your chest.  Allow your body to relax so that your hips drop lower and make contact more completely with the floor.  Hold this position for __________ seconds.  Slowly return to lying flat on the floor. Repeat __________ times. Complete this exercise __________ times per day.  RANGE OF MOTION - Extension, Prone Press Ups  Lie on your stomach on the floor, a bed will be too soft. Place your palms about shoulder width apart and at the height of your head.  Keeping your back as relaxed as possible, slowly straighten your elbows while keeping your hips on the floor. You may adjust the placement of your hands to maximize your comfort. As you gain motion, your hands will come more underneath your shoulders.  Hold this position __________ seconds.  Slowly return to lying flat on the floor. Repeat __________ times. Complete this exercise __________ times per day.  STRENGTHENING EXERCISES - Sciatica  These exercises may help you when beginning to rehabilitate your injury. These exercises should be done near your "sweet spot." This is the neutral, low-back arch, somewhere between fully rounded and fully arched, that is your least painful position. When performed in this safe range of motion, these exercises can be used for people who have either a flexion or extension based injury. These exercises may resolve your symptoms with or without further involvement from your physician, physical therapist or athletic trainer. While completing these exercises, remember:   Muscles can gain both the endurance and the strength  needed for everyday activities through controlled exercises.  Complete these exercises as instructed by your physician, physical therapist or athletic trainer. Progress with the resistance and repetition exercises only as your caregiver advises.  You may experience muscle soreness or fatigue, but the pain or discomfort you are trying to eliminate should never worsen during these exercises. If this pain does worsen, stop and make certain you are following the directions exactly. If the pain is still present after adjustments, discontinue the exercise until you can discuss the trouble with your clinician. STRENGTHENING - Deep Abdominals, Pelvic Tilt   Lie on a firm bed or floor. Keeping your legs in front of you, bend your knees so they are both pointed toward the ceiling and your feet are flat on the floor.  Tense your lower abdominal muscles to press your low back into the floor. This motion will rotate your pelvis so that your tail bone is scooping upwards rather than pointing at your feet or into the floor.  With a gentle tension and even breathing, hold this position for __________ seconds. Repeat __________ times. Complete this exercise __________ times per day.  STRENGTHENING - Abdominals, Crunches   Lie on a firm bed or floor. Keeping your legs in front of you, bend your knees so they are both pointed toward the ceiling and your feet are flat on the floor. Cross your arms over your chest.  Slightly tip your chin down without bending your neck.  Tense your abdominals and slowly lift your trunk high enough to just clear your shoulder blades. Lifting higher can put excessive stress on the low back and does not further strengthen your abdominal muscles.  Control your return to the starting position. Repeat __________ times. Complete this exercise __________ times per day.  STRENGTHENING - Quadruped, Opposite UE/LE Lift  Assume a hands and knees position on a firm surface. Keep your hands  under your shoulders and your knees under your hips. You may place padding under your knees for comfort.  Find your neutral spine and gently tense your abdominal muscles so that you can maintain this position. Your shoulders and hips should form a rectangle that is parallel with the floor and is not twisted.  Keeping your trunk steady, lift your right hand no higher than your shoulder and then your left leg no higher than your hip. Make sure you are not holding your breath. Hold this position __________ seconds.  Continuing to keep your abdominal muscles tense and your back steady, slowly return to your starting position. Repeat with the opposite arm and leg. Repeat __________ times. Complete this exercise __________ times per day.  STRENGTHENING - Abdominals and Quadriceps, Straight Leg Raise   Lie on a firm bed or floor with both legs extended in front of you.  Keeping one leg in contact with the floor, bend the other knee so that your foot can rest flat on the floor.  Find your neutral spine, and tense your abdominal muscles to maintain your spinal position throughout the exercise.  Slowly lift your straight leg off the floor about 6 inches for a count of 15, making sure to not hold your breath.  Still keeping your neutral spine, slowly lower your leg all the way to the floor. Repeat this exercise with each leg __________ times. Complete this exercise __________ times per day. POSTURE AND BODY MECHANICS CONSIDERATIONS - Sciatica Keeping correct posture when sitting, standing or completing your activities will reduce the stress put on different body tissues, allowing injured tissues a chance to heal and limiting painful experiences. The following are general guidelines for improved posture. Your physician or physical therapist will provide you with any instructions specific to your needs. While reading these guidelines, remember:  The exercises prescribed by your provider will help you have  the flexibility and strength to maintain correct postures.  The correct posture provides the optimal environment for your joints to work. All of your joints have less wear and tear when properly supported by a spine with good posture. This means you will experience a healthier, less painful body.  Correct posture must be practiced with all of your activities, especially prolonged sitting and standing. Correct posture is as important when doing repetitive low-stress activities (typing) as it is when doing a single heavy-load activity (lifting). RESTING POSITIONS Consider which positions are most painful for you when choosing a resting position. If you have pain with flexion-based activities (sitting, bending, stooping, squatting), choose a position that allows you to rest in a less flexed posture. You would want to avoid curling into a fetal position on your side. If your pain worsens with extension-based activities (  prolonged standing, working overhead), avoid resting in an extended position such as sleeping on your stomach. Most people will find more comfort when they rest with their spine in a more neutral position, neither too rounded nor too arched. Lying on a non-sagging bed on your side with a pillow between your knees, or on your back with a pillow under your knees will often provide some relief. Keep in mind, being in any one position for a prolonged period of time, no matter how correct your posture, can still lead to stiffness. PROPER SITTING POSTURE In order to minimize stress and discomfort on your spine, you must sit with correct posture Sitting with good posture should be effortless for a healthy body. Returning to good posture is a gradual process. Many people can work toward this most comfortably by using various supports until they have the flexibility and strength to maintain this posture on their own. When sitting with proper posture, your ears will fall over your shoulders and your  shoulders will fall over your hips. You should use the back of the chair to support your upper back. Your low back will be in a neutral position, just slightly arched. You may place a small pillow or folded towel at the base of your low back for support.  When working at a desk, create an environment that supports good, upright posture. Without extra support, muscles fatigue and lead to excessive strain on joints and other tissues. Keep these recommendations in mind: CHAIR:   A chair should be able to slide under your desk when your back makes contact with the back of the chair. This allows you to work closely.  The chair's height should allow your eyes to be level with the upper part of your monitor and your hands to be slightly lower than your elbows. BODY POSITION  Your feet should make contact with the floor. If this is not possible, use a foot rest.  Keep your ears over your shoulders. This will reduce stress on your neck and low back. INCORRECT SITTING POSTURES   If you are feeling tired and unable to assume a healthy sitting posture, do not slouch or slump. This puts excessive strain on your back tissues, causing more damage and pain. Healthier options include:  Using more support, like a lumbar pillow.  Switching tasks to something that requires you to be upright or walking.  Talking a brief walk.  Lying down to rest in a neutral-spine position. PROLONGED STANDING WHILE SLIGHTLY LEANING FORWARD  When completing a task that requires you to lean forward while standing in one place for a long time, place either foot up on a stationary 2-4 inch high object to help maintain the best posture. When both feet are on the ground, the low back tends to lose its slight inward curve. If this curve flattens (or becomes too large), then the back and your other joints will experience too much stress, fatigue more quickly and can cause pain.  CORRECT STANDING POSTURES Proper standing posture should  be assumed with all daily activities, even if they only take a few moments, like when brushing your teeth. As in sitting, your ears should fall over your shoulders and your shoulders should fall over your hips. You should keep a slight tension in your abdominal muscles to brace your spine. Your tailbone should point down to the ground, not behind your body, resulting in an over-extended swayback posture.  INCORRECT STANDING POSTURES  Common incorrect standing postures include a  forward head, locked knees and/or an excessive swayback. WALKING Walk with an upright posture. Your ears, shoulders and hips should all line-up. PROLONGED ACTIVITY IN A FLEXED POSITION When completing a task that requires you to bend forward at your waist or lean over a low surface, try to find a way to stabilize 3 of 4 of your limbs. You can place a hand or elbow on your thigh or rest a knee on the surface you are reaching across. This will provide you more stability so that your muscles do not fatigue as quickly. By keeping your knees relaxed, or slightly bent, you will also reduce stress across your low back. CORRECT LIFTING TECHNIQUES DO :   Assume a wide stance. This will provide you more stability and the opportunity to get as close as possible to the object which you are lifting.  Tense your abdominals to brace your spine; then bend at the knees and hips. Keeping your back locked in a neutral-spine position, lift using your leg muscles. Lift with your legs, keeping your back straight.  Test the weight of unknown objects before attempting to lift them.  Try to keep your elbows locked down at your sides in order get the best strength from your shoulders when carrying an object.  Always ask for help when lifting heavy or awkward objects. INCORRECT LIFTING TECHNIQUES DO NOT:   Lock your knees when lifting, even if it is a small object.  Bend and twist. Pivot at your feet or move your feet when needing to change  directions.  Assume that you cannot safely pick up a paperclip without proper posture.   This information is not intended to replace advice given to you by your health care provider. Make sure you discuss any questions you have with your health care provider.   Document Released: 11/08/2005 Document Revised: 03/25/2015 Document Reviewed: 02/20/2009 Elsevier Interactive Patient Education [employer].

## 2016-05-14 ENCOUNTER — Encounter: Payer: Self-pay | Admitting: Vascular Surgery

## 2016-05-15 ENCOUNTER — Ambulatory Visit (HOSPITAL_BASED_OUTPATIENT_CLINIC_OR_DEPARTMENT_OTHER)
Admission: RE | Admit: 2016-05-15 | Discharge: 2016-05-15 | Disposition: A | Discharge status: Home / Self Care | Payer: Medicare Other | Source: Ambulatory Visit | Attending: Physician Assistant | Admitting: Physician Assistant

## 2016-05-15 DIAGNOSIS — M5432 Sciatica, left side: Secondary | ICD-10-CM

## 2016-05-16 NOTE — Telephone Encounter (Signed)
The breast tenderness is likely due to the aldactone,   We discussed that at his last visit. He will be following up with his primary medical doctor

## 2016-05-17 ENCOUNTER — Telehealth: Payer: Self-pay | Admitting: Physician Assistant

## 2016-05-17 NOTE — Telephone Encounter (Signed)
Patient refuses to have Imaging for his Lower Back, repeatedly stating that "he knows where his pain is and it is in the hip"; explained to patient that he had c/o Left-side lower back pain with sciatica down LLE on 05/12/16 from chart note, pt denied and again stated that his pain has "always been in the hip". Informed patient that he has already had this imaging [hip] on 02/13/16 and nothing has warranted another xray/imaging for the hip; also pt spoke of Imaging from [facility] which I read over and again informed patient that there was no abnormality and/or acute issues with his hip [only the lumbar spine]. Patient still refused any imaging on lower back; informed him that he would have to wait until provider returns to office on Wed, 05/26/16 to discuss this in further detail with provider; pt understood and agreed/SLS 06/26

## 2016-05-17 NOTE — Telephone Encounter (Signed)
Caller name: Patrick Roth Relation to pt: self Call back number: [phone] Pharmacy:  Reason for call: Pt called stating was schedule to have MRI done for Saturday 05-15-16 for his PCP Hassell Done), pt states went to office of x-rays and did not have it done since the x-ray office mentioned that it was going to be an x-ray for his lower back, pt denies of having lower back x-rays since his issue has to do with his hip. Pt would like to have x-rays done for hip and would like the order changed to hip area instead of lower back. Please advise.

## 2016-05-18 DIAGNOSIS — F331 Major depressive disorder, recurrent, moderate: Secondary | ICD-10-CM | POA: Diagnosis not present

## 2016-05-18 NOTE — Telephone Encounter (Signed)
PCP referred this question back to you. Would you like to try chlorthalidone in the place of aldactone

## 2016-05-18 NOTE — Telephone Encounter (Signed)
The breast tenderness is likely from the Aldactone  Perhaps he could take a lower dose - 25 mg a day  This may not cause as much breast tenderness   He may stop the aldactone and try Chlorthalidone 25 mg a day instead of Lasix  DC lasix Add Kdur 20 meq a day  Check BMP in 3 weeks

## 2016-05-19 NOTE — Telephone Encounter (Signed)
Reviewed Dr. Elmarie Shiley advice with patient.  He is not currently taking lasix.  Patient would like to try aldactone 25 mg.  I offered to send in 25 mg tablets but patient states he would like to quarter the tablets he has now (he currently has 100 mg tablets).  I advised him to call back if he would like me to send in new Rx for aldactone 25 mg.  He verbalized understanding and agreement.

## 2016-05-21 ENCOUNTER — Ambulatory Visit (INDEPENDENT_AMBULATORY_CARE_PROVIDER_SITE_OTHER)
Admission: RE | Admit: 2016-05-21 | Discharge: 2016-05-21 | Disposition: A | Discharge status: Home / Self Care | Payer: Medicare Other | Source: Ambulatory Visit | Attending: Vascular Surgery | Admitting: Vascular Surgery

## 2016-05-21 ENCOUNTER — Ambulatory Visit (INDEPENDENT_AMBULATORY_CARE_PROVIDER_SITE_OTHER): Payer: Medicare Other | Admitting: Vascular Surgery

## 2016-05-21 ENCOUNTER — Ambulatory Visit (HOSPITAL_COMMUNITY)
Admission: RE | Admit: 2016-05-21 | Discharge: 2016-05-21 | Disposition: A | Discharge status: Home / Self Care | Payer: Medicare Other | Source: Ambulatory Visit | Attending: Vascular Surgery | Admitting: Vascular Surgery

## 2016-05-21 ENCOUNTER — Encounter: Payer: Self-pay | Admitting: Vascular Surgery

## 2016-05-21 VITALS — BP 136/84 | HR 52 | Temp 97.2°F | Resp 16 | Ht 68.0 in | Wt 202.0 lb

## 2016-05-21 DIAGNOSIS — G609 Hereditary and idiopathic neuropathy, unspecified: Secondary | ICD-10-CM | POA: Diagnosis not present

## 2016-05-21 DIAGNOSIS — I10 Essential (primary) hypertension: Secondary | ICD-10-CM | POA: Insufficient documentation

## 2016-05-21 DIAGNOSIS — M5136 Other intervertebral disc degeneration, lumbar region: Secondary | ICD-10-CM

## 2016-05-21 DIAGNOSIS — I7092 Chronic total occlusion of artery of the extremities: Secondary | ICD-10-CM | POA: Insufficient documentation

## 2016-05-21 DIAGNOSIS — E785 Hyperlipidemia, unspecified: Secondary | ICD-10-CM | POA: Diagnosis not present

## 2016-05-21 DIAGNOSIS — R0989 Other specified symptoms and signs involving the circulatory and respiratory systems: Secondary | ICD-10-CM | POA: Diagnosis present

## 2016-05-21 NOTE — Progress Notes (Signed)
Referring Physician: Brunetta Jeans, PA-C  Patient name: Patrick Roth MRN: [id number] DOB: May 27, 1946 Sex: male  REASON FOR CONSULT: Leg pain with numbness and burning  HPI: Patrick Roth is a 70 y.o. male,  He reprots have pain in his hips for years.  The pain has progressed down his legs with burning in his feet.  He states the pain is worse when walking up stairs.   He is able to walk and he works a regular job that requires activity daily.  Other medical problems include HTN managed with lasix and spironolactone, peripheral neuropathy managed with Neurontin just started this month.  His has no CAD, but he had a stroke 8 years ago that affected his right side.  The residual affect is decreased sensation in the right LE.    Past Medical History  Diagnosis Date  . Hypertension   . Stroke [facility]) 2008  . Hyperlipidemia   . Colon polyps   . Sciatica   . Internal hemorrhoids   . History of chicken pox    Past Surgical History  Procedure Laterality Date  . Great toe surgery    . Prostate biopsy    . Colonoscopy  2011    Family History  Problem Relation Age of Onset  . Hypertension Father 35    Deceased  . Heart disease Father   . Diabetes Neg Hx   . Heart attack Neg Hx   . Hyperlipidemia Neg Hx   . Sudden death Neg Hx   . Lung cancer Mother 44    Deceased  . Healthy Sister     x3  . Healthy Brother     x4  . Heart disease Brother     #5  . Other Daughter     Alpha Thalassemia  . Lupus Daughter     #2-deceased    SOCIAL HISTORY: Social History   Social History  . Marital Status: Married    Spouse Name: N/A  . Number of Children: 1  . Years of Education: 12   Occupational History  . Retired    Social History Main Topics  . Smoking status: Never Smoker   . Smokeless tobacco: Never Used  . Alcohol Use: No  . Drug Use: No  . Sexual Activity: Not on file   Other Topics Concern  . Not on file   Social History Narrative   Fun: Play golf and build  furniture.     Allergies  Allergen Reactions  . Atenolol Other (See Comments)    Drowsiness and "flu sxs"  . Crestor [Rosuvastatin Calcium] Shortness Of Breath    Muscle cramps  . Losartan Other (See Comments)  . Terazosin Shortness Of Breath and Palpitations    Nerve pain  . Amlodipine Other (See Comments)  . Clonidine Other (See Comments)  . Hydralazine Other (See Comments)    Joint swelling  . Hydrocodone-Acetaminophen Other (See Comments)  . Levofloxacin Other (See Comments)  . Olmesartan Other (See Comments)  . Other Other (See Comments)    Bystolic causing GI Issues    Current Outpatient Prescriptions  Medication Sig Dispense Refill  . aspirin 81 MG tablet Take 81 mg by mouth daily.    . celecoxib (CELEBREX) 100 MG capsule Take 1 capsule (100 mg total) by mouth daily. 30 capsule 0  . furosemide (LASIX) 20 MG tablet Take 1 tablet by mouth daily.  0  . gabapentin (NEURONTIN) 100 MG capsule Take 1 capsule (100 mg total)  by mouth 3 (three) times daily. 30 capsule 3  . methylPREDNISolone (MEDROL DOSEPAK) 4 MG TBPK tablet Take following package directions. 21 tablet 0  . Multiple Vitamins-Minerals (MENS MULTIVITAMIN PLUS) TABS Take by mouth daily. Reported on 04/30/2016    . spironolactone (ALDACTONE) 100 MG tablet Take 0.5 tablets (50 mg total) by mouth daily. 30 tablet 11  . traZODone (DESYREL) 50 MG tablet Take 0.5 tablets (25 mg total) by mouth at bedtime as needed for sleep. 30 tablet 0   No current facility-administered medications for this visit.    ROS:   General:  No weight loss, Fever, chills  HEENT: No recent headaches, no nasal bleeding, no visual changes, no sore throat  Neurologic: No dizziness, blackouts, seizures. No recent symptoms of stroke or mini- stroke. No recent episodes of slurred speech, or temporary blindness.  Cardiac: No recent episodes of chest pain/pressure, no shortness of breath at rest.  No shortness of breath with exertion.  Denies history  of atrial fibrillation or irregular heartbeat  Vascular: No history of rest pain in feet.  No history of claudication.  No history of non-healing ulcer, No history of DVT   Pulmonary: No home oxygen, no productive cough, no hemoptysis,  No asthma or wheezing  Musculoskeletal:  [ ]  Arthritis, [ ]  Low back pain,  [ ]  Joint pain  Hematologic:No history of hypercoagulable state.  No history of easy bleeding.  No history of anemia  Gastrointestinal: No hematochezia or melena,  No gastroesophageal reflux, no trouble swallowing  Urinary: [ ]  chronic Kidney disease, [ ]  on HD - [ ]  MWF or [ ]  TTHS, [ ]  Burning with urination, [ ]  Frequent urination, [ ]  Difficulty urinating;   Skin: No rashes  Psychological: No history of anxiety,  No history of depression   Physical Examination  Filed Vitals:   05/21/16 1447  BP: 136/84  Pulse: 52  Temp: 97.2 F (36.2 C)  TempSrc: Oral  Resp: 16  Height: 5\' 8"  (1.727 m)  Weight: 202 lb (91.627 kg)  SpO2: 98%    Body mass index is 30.72 kg/(m^2).  General:  Alert and oriented, no acute distress HEENT: Normal Neck: No bruit or JVD Pulmonary: Clear to auscultation bilaterally Cardiac: Regular Rate and Rhythm without murmur Abdomen: Soft, non-tender, non-distended, no mass, no scars Skin: No rash Extremity Pulses:  2+ radial, brachial, femoral, dorsalis pedis, posterior tibial pulses bilaterally Musculoskeletal: No deformity or edema  Neurologic: Upper and lower extremity motor 5/5 and symmetric  DATA:  ABI's Bilateral Triphasic flow Bilaterally 1.2 Wave forms are triphasic  ASSESSMENT:   Normal arterial flow without evidence of arterial occlusive disease. Lumbar DDD Peripheral neuropathy     PLAN:    His pain distribution pattern on the left LE follows L4 nerve distribution I reviewed a lumbar x ray from 01/2016 which shows significant DDD with spondylolisthesis and foraminal narrowing.  He may benefit from a check up with a spine  specialist. He may also benefit with increasing doses ofNeurontin  to comfort if he tolerates it. F/U PRN  Theda Sers, EMMA MAUREEN PA-C [facility]  The patient was seen in conjunction with Dr. Bridgett Larsson today   Addendum  I have independently interviewed and examined the patient, and I agree with the physician assistant's findings.  Pt's sx are not c/w vasculogenic claudication.  Additionally, his ABI are essentially normal.  It appears further work-up with ORTHO Spine or Neurosurgery might be necessary based on his sx.  Adele Barthel, MD [facility] Office: [phone] Pager: [phone]

## 2016-05-22 NOTE — Telephone Encounter (Signed)
MRI ordered was for lumbar spine as his symptoms were related to recurrent irritation of the sciatic nerve with concern that there was nerve compression originating in the lumbar spine. His acute symptoms were improving at time of last visit but we discussed potential need for further imaging of spine in the future if symptoms recurring. Patient wanted to proceed with further imaging.This is what was intended to be ordered and what was discussed at visit giving his recurrent symptoms. If he is refusing imaging now, we can hold off if symptoms have resolved. Otherwise I would recommend imaging and again would recommend imaging of the lumbar spine. He has chronic hip pain secondary to arthritis in the hip (shown on prior x-rays). The symptoms discussed at visit were indicative of a recurrent sciatica, or nerve pain stemming from nerve compression, from the lumbosacral spine. He is also welcome to speak with an Orthopedist regarding this.

## 2016-05-24 NOTE — Telephone Encounter (Signed)
Pt has called in and been scheduled to come in Wed morning at 8:30a/SLS

## 2016-05-26 ENCOUNTER — Encounter: Payer: Self-pay | Admitting: Physician Assistant

## 2016-05-26 ENCOUNTER — Ambulatory Visit (INDEPENDENT_AMBULATORY_CARE_PROVIDER_SITE_OTHER): Payer: Medicare Other | Admitting: Physician Assistant

## 2016-05-26 VITALS — BP 144/86 | HR 56 | Temp 98.0°F | Resp 16 | Ht 69.0 in | Wt 207.4 lb

## 2016-05-26 DIAGNOSIS — N62 Hypertrophy of breast: Secondary | ICD-10-CM

## 2016-05-26 DIAGNOSIS — M5416 Radiculopathy, lumbar region: Secondary | ICD-10-CM | POA: Diagnosis not present

## 2016-05-26 LAB — PROLACTIN: Prolactin: 5.7 ng/mL (ref 2.0–18.0)

## 2016-05-26 LAB — TESTOSTERONE: TESTOSTERONE: 407.36 ng/dL (ref 300.00–890.00)

## 2016-05-26 LAB — LUTEINIZING HORMONE: LH: 4.22 m[IU]/mL (ref 3.10–34.60)

## 2016-05-26 LAB — ESTRADIOL: Estradiol: 33 pg/mL (ref <=39)

## 2016-05-26 MED ORDER — CHLORTHALIDONE 25 MG PO TABS: 25.0000 mg | ORAL_TABLET | Freq: Every day | ORAL | Status: DC

## 2016-05-26 MED ORDER — TADALAFIL 20 MG PO TABS: 10.0000 mg | ORAL_TABLET | ORAL | Status: DC | PRN

## 2016-05-26 MED ORDER — POTASSIUM CHLORIDE CRYS ER 20 MEQ PO TBCR: 20.0000 meq | EXTENDED_RELEASE_TABLET | Freq: Every day | ORAL | Status: DC

## 2016-05-26 NOTE — Patient Instructions (Signed)
Please go to the lab for blood work.  I will call with results.  Stop the Aldactone and Lasix. Start the Chlorthalidone 25 mg and KDur daily as directed.   Follow-up with me in 3 weeks.  Go downstairs to schedule your MRI. I will call with these results as well and we will get you in with Neurosurgery.

## 2016-05-26 NOTE — Progress Notes (Signed)
Pre visit review using our clinic review tool, if applicable. No additional management support is needed unless otherwise documented below in the visit note/SLS  

## 2016-05-27 NOTE — Progress Notes (Signed)
Patient presents to clinic today c/o continued breast tenderness bilaterally. Patient has previously discussed this issue with his Cardiologist who placed him on Spironolactone. Patient has a very interesting history regarding BP medications, unable to tolerate most through non-specific intolerances. Was on Spironolactone 100 mg but had self decreased to 50 mg daily. Noted enlargement of breast with continued medication. Now with tenderness. Spoke with his specialist and decision was made to reduce to 25 mg daily to see if symptoms improved. Patient denies any improvement to symptoms. Denies change to skin or nipples. Denies mass. Just noted his chest looks like he has breasts.  Patient also would like to discuss imaging ordered at last visit. Plan was for MRI lumbar spine due to recurrent sciatica symptoms to assess for disc herniation and or spinal stenosis. Patient went to the imaging appointment but then refused imaging as he believed he was supposed to have his hip imaged. Is ready to have imaging performed at this time and needs rescheduled.   Past Medical History  Diagnosis Date  . Hypertension   . Stroke [facility]) 2008  . Hyperlipidemia   . Colon polyps   . Sciatica   . Internal hemorrhoids   . History of chicken pox     Current Outpatient Prescriptions on File Prior to Visit  Medication Sig Dispense Refill  . aspirin 81 MG tablet Take 81 mg by mouth daily.    . celecoxib (CELEBREX) 100 MG capsule Take 1 capsule (100 mg total) by mouth daily. 30 capsule 0  . gabapentin (NEURONTIN) 100 MG capsule Take 1 capsule (100 mg total) by mouth 3 (three) times daily. 30 capsule 3  . Multiple Vitamins-Minerals (MENS MULTIVITAMIN PLUS) TABS Take by mouth daily. Reported on 04/30/2016    . traZODone (DESYREL) 50 MG tablet Take 0.5 tablets (25 mg total) by mouth at bedtime as needed for sleep. 30 tablet 0   No current facility-administered medications on file prior to visit.    Allergies    Allergen Reactions  . Atenolol Other (See Comments)    Drowsiness and "flu sxs"  . Crestor [Rosuvastatin Calcium] Shortness Of Breath    Muscle cramps  . Losartan Other (See Comments)  . Terazosin Shortness Of Breath and Palpitations    Nerve pain  . Amlodipine Other (See Comments)  . Clonidine Other (See Comments)  . Hydralazine Other (See Comments)    Joint swelling  . Hydrocodone-Acetaminophen Other (See Comments)  . Levofloxacin Other (See Comments)  . Olmesartan Other (See Comments)  . Other Other (See Comments)    Bystolic causing GI Issues    Family History  Problem Relation Age of Onset  . Hypertension Father 14    Deceased  . Heart disease Father   . Diabetes Neg Hx   . Heart attack Neg Hx   . Hyperlipidemia Neg Hx   . Sudden death Neg Hx   . Lung cancer Mother 67    Deceased  . Healthy Sister     x3  . Healthy Brother     x4  . Heart disease Brother     #5  . Other Daughter     Alpha Thalassemia  . Lupus Daughter     #2-deceased    Social History   Social History  . Marital Status: Married    Spouse Name: N/A  . Number of Children: 1  . Years of Education: 12   Occupational History  . Retired    Social History Main Topics  .  Smoking status: Never Smoker   . Smokeless tobacco: Never Used  . Alcohol Use: No  . Drug Use: No  . Sexual Activity: Not Asked   Other Topics Concern  . None   Social History Narrative   Fun: Play golf and build furniture.     Review of Systems - See HPI.  All other ROS are negative.  BP 144/86 mmHg  Pulse 56  Temp(Src) 98 F (36.7 C) (Oral)  Resp 16  Ht 5' 9" (1.753 m)  Wt 207 lb 6 oz (94.065 kg)  BMI 30.61 kg/m2  SpO2 98%  Physical Exam  Constitutional: He is oriented to person, place, and time and well-developed, well-nourished, and in no distress.  HENT:  Head: Normocephalic and atraumatic.  Neck: Neck supple.  Cardiovascular: Normal rate, regular rhythm, normal heart sounds and intact distal  pulses.   Pulmonary/Chest: Effort normal and breath sounds normal. No respiratory distress. He has no wheezes. He has no rales. He exhibits no tenderness. Right breast exhibits tenderness. Right breast exhibits no inverted nipple, no mass, no nipple discharge and no skin change. Left breast exhibits tenderness. Left breast exhibits no inverted nipple, no mass, no nipple discharge and no skin change.  Patient with true breast tissue noted over pectorals bilaterally  Lymphadenopathy:    He has no cervical adenopathy.  Neurological: He is alert and oriented to person, place, and time.  Skin: Skin is warm and dry. No rash noted.  Psychiatric: Affect normal.  Vitals reviewed.   Recent Results (from the past 2160 hour(s))  CBC with Differential/Platelet     Status: None   Collection Time: 04/08/16  9:20 PM  Result Value Ref Range   WBC 6.1 4.0 - 10.5 K/uL   RBC 5.20 4.22 - 5.81 MIL/uL   Hemoglobin 15.2 13.0 - 17.0 g/dL   HCT 45.3 39.0 - 52.0 %   MCV 87.1 78.0 - 100.0 fL   MCH 29.2 26.0 - 34.0 pg   MCHC 33.6 30.0 - 36.0 g/dL   RDW 13.4 11.5 - 15.5 %   Platelets 151 150 - 400 K/uL   Neutrophils Relative % 69 %   Neutro Abs 4.2 1.7 - 7.7 K/uL   Lymphocytes Relative 16 %   Lymphs Abs 1.0 0.7 - 4.0 K/uL   Monocytes Relative 13 %   Monocytes Absolute 0.8 0.1 - 1.0 K/uL   Eosinophils Relative 2 %   Eosinophils Absolute 0.1 0.0 - 0.7 K/uL   Basophils Relative 0 %   Basophils Absolute 0.0 0.0 - 0.1 K/uL  Basic metabolic panel     Status: Abnormal   Collection Time: 04/08/16  9:20 PM  Result Value Ref Range   Sodium 138 135 - 145 mmol/L   Potassium 3.5 3.5 - 5.1 mmol/L   Chloride 103 101 - 111 mmol/L   CO2 29 22 - 32 mmol/L   Glucose, Bld 109 (H) 65 - 99 mg/dL   BUN 13 6 - 20 mg/dL   Creatinine, Ser 1.40 (H) 0.61 - 1.24 mg/dL   Calcium 9.0 8.9 - 10.3 mg/dL   GFR calc non Af Amer 49 (L) >60 mL/min   GFR calc Af Amer 57 (L) >60 mL/min    Comment: (NOTE) The eGFR has been calculated  using the CKD EPI equation. This calculation has not been validated in all clinical situations. eGFR's persistently <60 mL/min signify possible Chronic Kidney Disease.    Anion gap 6 5 - 15  POCT urinalysis dipstick  Status: None   Collection Time: 04/21/16 11:48 AM  Result Value Ref Range   Color, UA yellow    Clarity, UA clear    Glucose, UA neg    Bilirubin, UA neg    Ketones, UA neg    Spec Grav, UA 1.015    Blood, UA neg    pH, UA 6.0    Protein, UA neg    Urobilinogen, UA 0.2    Nitrite, UA neg    Leukocytes, UA Negative Negative  CULTURE, URINE COMPREHENSIVE     Status: None   Collection Time: 04/21/16  2:10 PM  Result Value Ref Range   Colony Count NO GROWTH    Organism ID, Bacteria NO GROWTH   CBC     Status: None   Collection Time: 05/07/16  4:01 PM  Result Value Ref Range   WBC 4.2 3.8 - 10.8 K/uL   RBC 4.99 4.20 - 5.80 MIL/uL   Hemoglobin 14.4 13.2 - 17.1 g/dL   HCT 43.8 38.5 - 50.0 %   MCV 87.8 80.0 - 100.0 fL   MCH 28.9 27.0 - 33.0 pg   MCHC 32.9 32.0 - 36.0 g/dL   RDW 13.7 11.0 - 15.0 %   Platelets 182 140 - 400 K/uL   MPV 11.2 7.5 - 12.5 fL    Comment: ** Please note change in unit of measure and reference range(s). **  Comp Met (CMET)     Status: Abnormal   Collection Time: 05/07/16  4:01 PM  Result Value Ref Range   Sodium 137 135 - 146 mmol/L   Potassium 4.2 3.5 - 5.3 mmol/L   Chloride 102 98 - 110 mmol/L   CO2 30 20 - 31 mmol/L   Glucose, Bld 93 65 - 99 mg/dL   BUN 14 7 - 25 mg/dL   Creat 1.22 (H) 0.70 - 1.18 mg/dL    Comment:   For patients > or = 71 years of age: The upper reference limit for Creatinine is approximately 13% higher for people identified as African-American.      Total Bilirubin 0.8 0.2 - 1.2 mg/dL   Alkaline Phosphatase 64 40 - 115 U/L   AST 52 (H) 10 - 35 U/L   ALT 32 9 - 46 U/L   Total Protein 6.6 6.1 - 8.1 g/dL   Albumin 3.8 3.6 - 5.1 g/dL   Calcium 9.2 8.6 - 10.3 mg/dL  Urinalysis, Routine w reflex microscopic  (not at [facility])     Status: None   Collection Time: 05/10/16  1:28 PM  Result Value Ref Range   Color, Urine YELLOW Yellow;Lt. Yellow   APPearance CLEAR Clear   Specific Gravity, Urine 1.015 1.000-1.030   pH 5.5 5.0 - 8.0   Total Protein, Urine NEGATIVE Negative   Urine Glucose NEGATIVE Negative   Ketones, ur NEGATIVE Negative   Bilirubin Urine NEGATIVE Negative   Hgb urine dipstick NEGATIVE Negative   Urobilinogen, UA 0.2 0.0 - 1.0   Leukocytes, UA NEGATIVE Negative   Nitrite NEGATIVE Negative   WBC, UA 0-2/hpf 0-2/hpf   RBC / HPF none seen 0-2/hpf   Squamous Epithelial / LPF Rare(0-4/hpf) Rare(0-4/hpf)  Testosterone     Status: None   Collection Time: 05/26/16  9:44 AM  Result Value Ref Range   Testosterone 407.36 300.00 - 890.00 ng/dL  LH     Status: None   Collection Time: 05/26/16  9:44 AM  Result Value Ref Range   LH 4.22 3.10 -  34.60 mIU/mL    Comment: Male Reference Range:20-70 yrs     1.5-9.3 mIU/mL>70 yrs       3.1-35.6 mIU/mLFemale Reference Range:Follicular Phase     9.3-23.5 mIU/mLMidcycle             8.7-76.3 mIU/mLLuteal Phase         0.5-16.9 mIU/mL  Post Menopausal      15.9-54.0  mIU/mLPregnant             <1.5 mIU/mLContraceptives       0.7-5.6 mIU/mL   Estradiol     Status: None   Collection Time: 05/26/16  9:44 AM  Result Value Ref Range   Estradiol 33 <=39 pg/mL    Comment: Reference range established on post-pubertal patient population. No pre-pubertal reference range established using this assay. For any patients for whom low estradiol levels are anticipated (e.g. males, pre-pubertal children, and hypogonadal/post-menopausal females), the [employer] Estradiol, Ultrasensitive, LCMSMS assay is recommended. (Order code [id number]).     Prolactin     Status: None   Collection Time: 05/26/16  9:44 AM  Result Value Ref Range   Prolactin 5.7 2.0 - 18.0 ng/mL    Assessment/Plan: .1. Gynecomastia Most likely due to [place]  therapy. No palpable mass on exam. Tenderness present despite decreased dose. Will stop Aldactone and Begin Chlorthalidone 25 mg daily for BP. Giving this we will also dc Lasix for now and begin potassium supplement at 20 mEq per day. FU scheduled. Will check lab panel today to assess for other causes of gynecomastia.   - Testosterone - LH - Estradiol - Prolactin  2. Lumbar radiculopathy Patient agrees now to MRI. Will replace order. Will call with results once obtained. - MR Lumbar Spine Wo Contrast; Future   Leeanne Rio, PA-C

## 2016-05-28 ENCOUNTER — Telehealth: Payer: Self-pay | Admitting: Physician Assistant

## 2016-05-28 NOTE — Telephone Encounter (Signed)
°  Relationship to patient: Self  Can be reached: [phone]   Reason for call: Patient states he is confused about which medications he is suppose to be taking. States he thinks he was told to stop Aldactone and Lasix. Please inform him which medications he is to take.

## 2016-05-28 NOTE — Telephone Encounter (Signed)
Patient Instructions  AVS  05/26/16   Please go to the lab for blood work.  I will call with results.  Stop the Aldactone and Lasix. Start the Chlorthalidone 25 mg and KDur daily as directed.   Follow-up with me in 3 weeks.  Go downstairs to schedule your MRI. I will call with these results as well and we will get you in with Neurosurgery.   LMOM with contact name and number [for return call, if needed]  RE: following the above highlighted medication instructions from his AVS given at his 05/26/16 OV per provider instructions; repeated Rx instructions twice on voicemail/SLS 07/07

## 2016-05-29 ENCOUNTER — Ambulatory Visit (HOSPITAL_BASED_OUTPATIENT_CLINIC_OR_DEPARTMENT_OTHER)
Admission: RE | Admit: 2016-05-29 | Discharge: 2016-05-29 | Disposition: A | Discharge status: Home / Self Care | Payer: Medicare Other | Source: Ambulatory Visit | Attending: Physician Assistant | Admitting: Physician Assistant

## 2016-05-29 DIAGNOSIS — M5136 Other intervertebral disc degeneration, lumbar region: Secondary | ICD-10-CM | POA: Diagnosis not present

## 2016-05-29 DIAGNOSIS — M4806 Spinal stenosis, lumbar region: Secondary | ICD-10-CM | POA: Diagnosis not present

## 2016-05-29 DIAGNOSIS — M5126 Other intervertebral disc displacement, lumbar region: Secondary | ICD-10-CM | POA: Diagnosis not present

## 2016-05-29 DIAGNOSIS — M5416 Radiculopathy, lumbar region: Secondary | ICD-10-CM | POA: Diagnosis not present

## 2016-05-31 ENCOUNTER — Other Ambulatory Visit: Payer: Self-pay | Admitting: Physician Assistant

## 2016-05-31 ENCOUNTER — Telehealth: Payer: Self-pay | Admitting: Physician Assistant

## 2016-05-31 DIAGNOSIS — M48061 Spinal stenosis, lumbar region without neurogenic claudication: Secondary | ICD-10-CM

## 2016-05-31 NOTE — Telephone Encounter (Signed)
°  Relationship to patient: Self Can be reached:[phone]   Reason for call: Patient request call back to find out if the potassium medication he is taking could be causing him to feel tired

## 2016-05-31 NOTE — Telephone Encounter (Signed)
Spoke with patient concerning issue. Not typical side effect. Will attempt trial of QOD dosing until follow-up next week to see if this helps.

## 2016-06-09 DIAGNOSIS — M5416 Radiculopathy, lumbar region: Secondary | ICD-10-CM | POA: Diagnosis not present

## 2016-06-16 ENCOUNTER — Ambulatory Visit: Payer: Medicare Other | Admitting: Physician Assistant

## 2016-06-16 ENCOUNTER — Ambulatory Visit (INDEPENDENT_AMBULATORY_CARE_PROVIDER_SITE_OTHER): Payer: Medicare Other | Admitting: Physician Assistant

## 2016-06-16 ENCOUNTER — Encounter: Payer: Self-pay | Admitting: Physician Assistant

## 2016-06-16 VITALS — BP 138/88 | HR 63 | Temp 98.3°F | Resp 16 | Ht 69.0 in | Wt 203.0 lb

## 2016-06-16 DIAGNOSIS — S46811A Strain of other muscles, fascia and tendons at shoulder and upper arm level, right arm, initial encounter: Secondary | ICD-10-CM | POA: Diagnosis not present

## 2016-06-16 DIAGNOSIS — I10 Essential (primary) hypertension: Secondary | ICD-10-CM

## 2016-06-16 DIAGNOSIS — G609 Hereditary and idiopathic neuropathy, unspecified: Secondary | ICD-10-CM | POA: Diagnosis not present

## 2016-06-16 MED ORDER — TIZANIDINE HCL 2 MG PO TABS: 2.0000 mg | ORAL_TABLET | Freq: Every day | ORAL | 0 refills | Status: DC

## 2016-06-16 NOTE — Patient Instructions (Signed)
I am glad you are doing so well.  Please limit heavy lifting and overexertion.  Take the Zanaflex in the evening as directed to help with muscle tension. Heating pad will also help. Let me know if this does not continue to resolve.  Please restart your Gabapentin twice daily for a couple of days. Then increase to three times daily. This is why you have been having a recurrence of the neuropathy pain.  I will contact you when I hear from insurance if they will pay for Livalo for cholesterol.  Fu 3 months.

## 2016-06-16 NOTE — Progress Notes (Signed)
Patient presents to clinic today for follow-up of hypertension and neuropathy. Also notes R sided neck pain x 1 week.  Hypertension -- Is taking Chlorthalidone as directed. Tolerating well without side effects. Patient denies chest pain, palpitations, lightheadedness, dizziness, vision changes or frequent headaches.  BP Readings from Last 3 Encounters:  06/16/16 138/88  05/26/16 (!) 144/86  05/21/16 136/84   Neuropathy -- Patient previously doing well on Gabapentin. Was having some leg aches so he stopped all medications except for Chlorthalidone with resolution of aching. Has noted return of tingling and burning of hands and feet. Is wanting to know if he should restart medication.  Patient endorses R sided neck pain described as pulling and tightness. Denies trauma or injury. Denies swelling. Denies radiation of pain.  Notes mild increase in pain today but has been doing a lot of painting/spraying. Is R hand dominant.    Past Medical History:  Diagnosis Date  . Colon polyps   . History of chicken pox   . Hyperlipidemia   . Hypertension   . Internal hemorrhoids   . Sciatica   . Stroke [facility]) 2008    Current Outpatient Prescriptions on File Prior to Visit  Medication Sig Dispense Refill  . aspirin 81 MG tablet Take 81 mg by mouth daily.    . celecoxib (CELEBREX) 100 MG capsule Take 1 capsule (100 mg total) by mouth daily. 30 capsule 0  . chlorthalidone (HYGROTON) 25 MG tablet Take 1 tablet (25 mg total) by mouth daily. 30 tablet 1  . traZODone (DESYREL) 50 MG tablet Take 0.5 tablets (25 mg total) by mouth at bedtime as needed for sleep. 30 tablet 0  . gabapentin (NEURONTIN) 100 MG capsule Take 1 capsule (100 mg total) by mouth 3 (three) times daily. (Patient not taking: Reported on 06/16/2016) 30 capsule 3  . Multiple Vitamins-Minerals (MENS MULTIVITAMIN PLUS) TABS Take by mouth daily. Reported on 04/30/2016    . potassium chloride SA (K-DUR,KLOR-CON) 20 MEQ tablet Take 1 tablet  (20 mEq total) by mouth daily. (Patient not taking: Reported on 06/16/2016) 30 tablet 1  . tadalafil (CIALIS) 20 MG tablet Take 0.5-1 tablets (10-20 mg total) by mouth every other day as needed for erectile dysfunction. (Patient not taking: Reported on 06/16/2016) 2 tablet 1   No current facility-administered medications on file prior to visit.     Allergies  Allergen Reactions  . Atenolol Other (See Comments)    Drowsiness and "flu sxs"  . Crestor [Rosuvastatin Calcium] Shortness Of Breath    Muscle cramps  . Losartan Other (See Comments)  . Terazosin Shortness Of Breath and Palpitations    Nerve pain  . Amlodipine Other (See Comments)  . Clonidine Other (See Comments)  . Hydralazine Other (See Comments)    Joint swelling  . Hydrocodone-Acetaminophen Other (See Comments)  . Levofloxacin Other (See Comments)  . Olmesartan Other (See Comments)  . Other Other (See Comments)    Bystolic causing GI Issues    Family History  Problem Relation Age of Onset  . Hypertension Father 64    Deceased  . Heart disease Father   . Diabetes Neg Hx   . Heart attack Neg Hx   . Hyperlipidemia Neg Hx   . Sudden death Neg Hx   . Lung cancer Mother 34    Deceased  . Healthy Sister     x3  . Healthy Brother     x4  . Heart disease Brother     #5  .  Other Daughter     Alpha Thalassemia  . Lupus Daughter     #2-deceased    Social History   Social History  . Marital status: Married    Spouse name: N/A  . Number of children: 1  . Years of education: 59   Occupational History  . Retired    Social History Main Topics  . Smoking status: Never Smoker  . Smokeless tobacco: Never Used  . Alcohol use No  . Drug use: No  . Sexual activity: Not Asked   Other Topics Concern  . None   Social History Narrative   Fun: Play golf and build furniture.     Review of Systems - See HPI.  All other ROS are negative.  BP 138/88 (BP Location: Left Arm, Patient Position: Sitting, Cuff Size:  Normal)   Pulse 63   Temp 98.3 F (36.8 C) (Oral)   Resp 16   Ht _0  (1.753 m)   Wt 203 lb (92.1 kg)   SpO2 98%   BMI 29.98 kg/m   Physical Exam  Constitutional: He is oriented to person, place, and time and well-developed, well-nourished, and in no distress.  HENT:  Head: Normocephalic and atraumatic.  Eyes: Conjunctivae are normal.  Neck: Normal range of motion. Neck supple. Muscular tenderness present. No spinous process tenderness present. No neck rigidity. No thyromegaly present.  Cardiovascular: Normal rate, regular rhythm, normal heart sounds and intact distal pulses.   Pulmonary/Chest: Effort normal and breath sounds normal. No respiratory distress. He has no wheezes. He has no rales. He exhibits no tenderness.  Neurological: He is alert and oriented to person, place, and time. No cranial nerve deficit.  Skin: Skin is warm and dry. No rash noted.  Psychiatric: Affect normal.  Vitals reviewed.   Recent Results (from the past 2160 hour(s))  CBC with Differential/Platelet     Status: None   Collection Time: 04/08/16  9:20 PM  Result Value Ref Range   WBC 6.1 4.0 - 10.5 K/uL   RBC 5.20 4.22 - 5.81 MIL/uL   Hemoglobin 15.2 13.0 - 17.0 g/dL   HCT 45.3 39.0 - 52.0 %   MCV 87.1 78.0 - 100.0 fL   MCH 29.2 26.0 - 34.0 pg   MCHC 33.6 30.0 - 36.0 g/dL   RDW 13.4 11.5 - 15.5 %   Platelets 151 150 - 400 K/uL   Neutrophils Relative % 69 %   Neutro Abs 4.2 1.7 - 7.7 K/uL   Lymphocytes Relative 16 %   Lymphs Abs 1.0 0.7 - 4.0 K/uL   Monocytes Relative 13 %   Monocytes Absolute 0.8 0.1 - 1.0 K/uL   Eosinophils Relative 2 %   Eosinophils Absolute 0.1 0.0 - 0.7 K/uL   Basophils Relative 0 %   Basophils Absolute 0.0 0.0 - 0.1 K/uL  Basic metabolic panel     Status: Abnormal   Collection Time: 04/08/16  9:20 PM  Result Value Ref Range   Sodium 138 135 - 145 mmol/L   Potassium 3.5 3.5 - 5.1 mmol/L   Chloride 103 101 - 111 mmol/L   CO2 29 22 - 32 mmol/L   Glucose, Bld 109 (H)  65 - 99 mg/dL   BUN 13 6 - 20 mg/dL   Creatinine, Ser 1.40 (H) 0.61 - 1.24 mg/dL   Calcium 9.0 8.9 - 10.3 mg/dL   GFR calc non Af Amer 49 (L) >60 mL/min   GFR calc Af Amer 57 (L) >60 mL/min  Comment: (NOTE) The eGFR has been calculated using the CKD EPI equation. This calculation has not been validated in all clinical situations. eGFR's persistently <60 mL/min signify possible Chronic Kidney Disease.    Anion gap 6 5 - 15  POCT urinalysis dipstick     Status: None   Collection Time: 04/21/16 11:48 AM  Result Value Ref Range   Color, UA yellow    Clarity, UA clear    Glucose, UA neg    Bilirubin, UA neg    Ketones, UA neg    Spec Grav, UA 1.015    Blood, UA neg    pH, UA 6.0    Protein, UA neg    Urobilinogen, UA 0.2    Nitrite, UA neg    Leukocytes, UA Negative Negative  CULTURE, URINE COMPREHENSIVE     Status: None   Collection Time: 04/21/16  2:10 PM  Result Value Ref Range   Colony Count NO GROWTH    Organism ID, Bacteria NO GROWTH   CBC     Status: None   Collection Time: 05/07/16  4:01 PM  Result Value Ref Range   WBC 4.2 3.8 - 10.8 K/uL   RBC 4.99 4.20 - 5.80 MIL/uL   Hemoglobin 14.4 13.2 - 17.1 g/dL   HCT 43.8 38.5 - 50.0 %   MCV 87.8 80.0 - 100.0 fL   MCH 28.9 27.0 - 33.0 pg   MCHC 32.9 32.0 - 36.0 g/dL   RDW 13.7 11.0 - 15.0 %   Platelets 182 140 - 400 K/uL   MPV 11.2 7.5 - 12.5 fL    Comment: ** Please note change in unit of measure and reference range(s). **  Comp Met (CMET)     Status: Abnormal   Collection Time: 05/07/16  4:01 PM  Result Value Ref Range   Sodium 137 135 - 146 mmol/L   Potassium 4.2 3.5 - 5.3 mmol/L   Chloride 102 98 - 110 mmol/L   CO2 30 20 - 31 mmol/L   Glucose, Bld 93 65 - 99 mg/dL   BUN 14 7 - 25 mg/dL   Creat 1.22 (H) 0.70 - 1.18 mg/dL    Comment:   For patients > or = 71 years of age: The upper reference limit for Creatinine is approximately 13% higher for people identified as African-American.      Total Bilirubin  0.8 0.2 - 1.2 mg/dL   Alkaline Phosphatase 64 40 - 115 U/L   AST 52 (H) 10 - 35 U/L   ALT 32 9 - 46 U/L   Total Protein 6.6 6.1 - 8.1 g/dL   Albumin 3.8 3.6 - 5.1 g/dL   Calcium 9.2 8.6 - 10.3 mg/dL  Urinalysis, Routine w reflex microscopic (not at [facility])     Status: None   Collection Time: 05/10/16  1:28 PM  Result Value Ref Range   Color, Urine YELLOW Yellow;Lt. Yellow   APPearance CLEAR Clear   Specific Gravity, Urine 1.015 1.000 - 1.030   pH 5.5 5.0 - 8.0   Total Protein, Urine NEGATIVE Negative   Urine Glucose NEGATIVE Negative   Ketones, ur NEGATIVE Negative   Bilirubin Urine NEGATIVE Negative   Hgb urine dipstick NEGATIVE Negative   Urobilinogen, UA 0.2 0.0 - 1.0   Leukocytes, UA NEGATIVE Negative   Nitrite NEGATIVE Negative   WBC, UA 0-2/hpf 0-2/hpf   RBC / HPF none seen 0-2/hpf   Squamous Epithelial / LPF Rare(0-4/hpf) Rare(0-4/hpf)  Testosterone  Status: None   Collection Time: 05/26/16  9:44 AM  Result Value Ref Range   Testosterone 407.36 300.00 - 890.00 ng/dL  LH     Status: None   Collection Time: 05/26/16  9:44 AM  Result Value Ref Range   LH 4.22 3.10 - 34.60 mIU/mL    Comment: Male Reference Range:20-70 yrs     1.5-9.3 mIU/mL>70 yrs       3.1-35.6 mIU/mLFemale Reference Range:Follicular Phase     2.0-91.0 mIU/mLMidcycle             8.7-76.3 mIU/mLLuteal Phase         0.5-16.9 mIU/mL  Post Menopausal      15.9-54.0  mIU/mLPregnant             <1.5 mIU/mLContraceptives       0.7-5.6 mIU/mL   Estradiol     Status: None   Collection Time: 05/26/16  9:44 AM  Result Value Ref Range   Estradiol 33 <=39 pg/mL    Comment: Reference range established on post-pubertal patient population. No pre-pubertal reference range established using this assay. For any patients for whom low estradiol levels are anticipated (e.g. males, pre-pubertal children, and hypogonadal/post-menopausal females), the [employer] Estradiol, Ultrasensitive,  LCMSMS assay is recommended. (Order code [id number]).     Prolactin     Status: None   Collection Time: 05/26/16  9:44 AM  Result Value Ref Range   Prolactin 5.7 2.0 - 18.0 ng/mL    Assessment/Plan: 1. Essential hypertension Well-controlled. Asymptomatic. Will continue current regimen. FU scheduled. Will repeat BMP at next visit.  2. Idiopathic neuropathy Patient to restart Gabapentin 100 mg BID x 3-4 days then increase to 100 mg TID. FU if symptoms are not resolving.  3. Trapezius strain, right, initial encounter Mild. No bony tenderness or neck rigidity. Full ROM but with mild pain. Supportive measures reviewed. Celebrex as directed. Rx low-dose Zanaflex to use in the evening for a few nights to cal tension/spasm down. FU if not resolving.   Leeanne Rio, PA-C

## 2016-06-17 ENCOUNTER — Telehealth: Payer: Self-pay | Admitting: Physician Assistant

## 2016-06-17 NOTE — Telephone Encounter (Signed)
No charge this time. 

## 2016-06-17 NOTE — Telephone Encounter (Signed)
Pt came in late for 9:15am appt and came back later in the day 06/16/16, charge or no charge?

## 2016-06-20 ENCOUNTER — Observation Stay (HOSPITAL_COMMUNITY)
Admission: EM | Admit: 2016-06-20 | Discharge: 2016-06-21 | Disposition: A | Discharge status: Home / Self Care | Payer: Medicare Other | Attending: Internal Medicine | Admitting: Internal Medicine

## 2016-06-20 ENCOUNTER — Observation Stay (HOSPITAL_COMMUNITY): Payer: Medicare Other

## 2016-06-20 ENCOUNTER — Encounter (HOSPITAL_COMMUNITY): Payer: Self-pay

## 2016-06-20 DIAGNOSIS — G8929 Other chronic pain: Secondary | ICD-10-CM | POA: Diagnosis not present

## 2016-06-20 DIAGNOSIS — R55 Syncope and collapse: Principal | ICD-10-CM | POA: Insufficient documentation

## 2016-06-20 DIAGNOSIS — Z8673 Personal history of transient ischemic attack (TIA), and cerebral infarction without residual deficits: Secondary | ICD-10-CM | POA: Diagnosis not present

## 2016-06-20 DIAGNOSIS — I10 Essential (primary) hypertension: Secondary | ICD-10-CM | POA: Diagnosis not present

## 2016-06-20 DIAGNOSIS — E785 Hyperlipidemia, unspecified: Secondary | ICD-10-CM | POA: Diagnosis not present

## 2016-06-20 DIAGNOSIS — E871 Hypo-osmolality and hyponatremia: Secondary | ICD-10-CM | POA: Insufficient documentation

## 2016-06-20 DIAGNOSIS — M5126 Other intervertebral disc displacement, lumbar region: Secondary | ICD-10-CM | POA: Diagnosis not present

## 2016-06-20 DIAGNOSIS — E876 Hypokalemia: Secondary | ICD-10-CM | POA: Diagnosis not present

## 2016-06-20 DIAGNOSIS — Z7982 Long term (current) use of aspirin: Secondary | ICD-10-CM | POA: Insufficient documentation

## 2016-06-20 DIAGNOSIS — M545 Low back pain: Secondary | ICD-10-CM | POA: Diagnosis not present

## 2016-06-20 DIAGNOSIS — D696 Thrombocytopenia, unspecified: Secondary | ICD-10-CM | POA: Insufficient documentation

## 2016-06-20 HISTORY — DX: Syncope and collapse: R55

## 2016-06-20 LAB — URINALYSIS, ROUTINE W REFLEX MICROSCOPIC
Bilirubin Urine: NEGATIVE
GLUCOSE, UA: NEGATIVE mg/dL
Hgb urine dipstick: NEGATIVE
Ketones, ur: NEGATIVE mg/dL
LEUKOCYTES UA: NEGATIVE
NITRITE: NEGATIVE
PH: 5.5 (ref 5.0–8.0)
PROTEIN: NEGATIVE mg/dL
Specific Gravity, Urine: 1.014 (ref 1.005–1.030)

## 2016-06-20 LAB — BASIC METABOLIC PANEL
ANION GAP: 8 (ref 5–15)
BUN: 14 mg/dL (ref 6–20)
CHLORIDE: 100 mmol/L — AB (ref 101–111)
CO2: 25 mmol/L (ref 22–32)
Calcium: 8.7 mg/dL — ABNORMAL LOW (ref 8.9–10.3)
Creatinine, Ser: 1.21 mg/dL (ref 0.61–1.24)
GFR calc non Af Amer: 58 mL/min — ABNORMAL LOW (ref >60)
Glucose, Bld: 126 mg/dL — ABNORMAL HIGH (ref 65–99)
POTASSIUM: 3 mmol/L — AB (ref 3.5–5.1)
SODIUM: 133 mmol/L — AB (ref 135–145)

## 2016-06-20 LAB — CBG MONITORING, ED: Glucose-Capillary: 184 mg/dL — ABNORMAL HIGH (ref 65–99)

## 2016-06-20 LAB — CBC
HEMATOCRIT: 49.7 % (ref 39.0–52.0)
Hemoglobin: 16.2 g/dL (ref 13.0–17.0)
MCH: 28.6 pg (ref 26.0–34.0)
MCHC: 32.6 g/dL (ref 30.0–36.0)
MCV: 87.8 fL (ref 78.0–100.0)
Platelets: 171 10*3/uL (ref 150–400)
RBC: 5.66 MIL/uL (ref 4.22–5.81)
RDW: 13.8 % (ref 11.5–15.5)
WBC: 5.2 10*3/uL (ref 4.0–10.5)

## 2016-06-20 LAB — I-STAT TROPONIN, ED: TROPONIN I, POC: 0.02 ng/mL (ref 0.00–0.08)

## 2016-06-20 MED ORDER — SODIUM CHLORIDE 0.9% FLUSH
3.0000 mL | Freq: Two times a day (BID) | INTRAVENOUS | Status: DC
  Administered 2016-06-20 – 2016-06-21 (×3): 3 mL via INTRAVENOUS

## 2016-06-20 MED ORDER — POTASSIUM CHLORIDE CRYS ER 20 MEQ PO TBCR
60.0000 meq | EXTENDED_RELEASE_TABLET | Freq: Once | ORAL | Status: AC
  Administered 2016-06-20: 60 meq via ORAL
  Filled 2016-06-20: qty 3

## 2016-06-20 MED ORDER — ONDANSETRON HCL 4 MG PO TABS: 4.0000 mg | ORAL_TABLET | Freq: Four times a day (QID) | ORAL | Status: DC | PRN

## 2016-06-20 MED ORDER — MORPHINE SULFATE (PF) 2 MG/ML IV SOLN: 2.0000 mg | INTRAVENOUS | Status: DC | PRN

## 2016-06-20 MED ORDER — SODIUM CHLORIDE 0.9 % IV BOLUS (SEPSIS)
500.0000 mL | Freq: Once | INTRAVENOUS | Status: AC
  Administered 2016-06-20: 500 mL via INTRAVENOUS

## 2016-06-20 MED ORDER — ENOXAPARIN SODIUM 40 MG/0.4ML ~~LOC~~ SOLN
40.0000 mg | SUBCUTANEOUS | Status: DC
  Administered 2016-06-20: 40 mg via SUBCUTANEOUS
  Filled 2016-06-20 (×3): qty 0.4

## 2016-06-20 MED ORDER — SODIUM CHLORIDE 0.9 % IV SOLN
INTRAVENOUS | Status: DC
  Administered 2016-06-20: 17:00:00 via INTRAVENOUS

## 2016-06-20 MED ORDER — SENNOSIDES-DOCUSATE SODIUM 8.6-50 MG PO TABS: 1.0000 | ORAL_TABLET | Freq: Every evening | ORAL | Status: DC | PRN

## 2016-06-20 MED ORDER — ASPIRIN EC 81 MG PO TBEC
81.0000 mg | DELAYED_RELEASE_TABLET | Freq: Every day | ORAL | Status: DC
  Administered 2016-06-20 – 2016-06-21 (×2): 81 mg via ORAL
  Filled 2016-06-20 (×2): qty 1

## 2016-06-20 MED ORDER — ONDANSETRON HCL 4 MG/2ML IJ SOLN: 4.0000 mg | Freq: Four times a day (QID) | INTRAMUSCULAR | Status: DC | PRN

## 2016-06-20 MED ORDER — TRAZODONE HCL 50 MG PO TABS: 25.0000 mg | ORAL_TABLET | Freq: Every evening | ORAL | Status: DC | PRN

## 2016-06-20 MED ORDER — TIZANIDINE HCL 4 MG PO TABS
2.0000 mg | ORAL_TABLET | Freq: Every day | ORAL | Status: DC
  Administered 2016-06-20: 2 mg via ORAL
  Filled 2016-06-20: qty 1

## 2016-06-20 NOTE — ED Provider Notes (Signed)
[place] DEPT Provider Note   CSN: [id number] Arrival date & time: 06/20/16  [id number]  First Provider Contact:  First MD Initiated Contact with Patient 06/20/16 1104        History   Chief Complaint Chief Complaint  Patient presents with  . Loss of Consciousness    HPI   Patrick Roth is an 70 y.o. male with history of stroke, HTN, HLD who presents to the ED for evaluation after a syncopal episode. He states he was in his usual state of health until this morning when he stood up after eating breakfast and the next thing he knew he woke up on the floor. His wife accompanies him and states he fell to the floor and would not respond to her but did not appear to be completely unconscious.  He denies any prodromal symptoms. He states he has labile blood pressures and heart rates that he has been working with PCP and cardiology to control since his stroke years ago. He states he frequently feels lightheaded when he stands quickly or when he is straining, or in a hot room, but he has never passed out completely. He denies chest pain. He endorses some shortness of breath but states that he has chronic problems with shortness of breath and this is not new. Denies recent illness.   Past Medical History:  Diagnosis Date  . Colon polyps   . History of chicken pox   . Hyperlipidemia   . Hypertension   . Internal hemorrhoids   . Sciatica   . Stroke [facility]) 2008    Patient Active Problem List   Diagnosis Date Noted  . Hyperlipemia 02/13/2016  . Essential hypertension 05/27/2015  . Mallet deformity of fourth finger, right 07/11/2013    Past Surgical History:  Procedure Laterality Date  . COLONOSCOPY  2011  . great toe surgery    . PROSTATE BIOPSY         Home Medications    Prior to Admission medications   Medication Sig Start Date End Date Taking? Authorizing Provider  aspirin 81 MG tablet Take 81 mg by mouth daily.    Historical Provider, MD  celecoxib (CELEBREX) 100 MG  capsule Take 1 capsule (100 mg total) by mouth daily. 04/21/16   Brunetta Jeans, PA-C  chlorthalidone (HYGROTON) 25 MG tablet Take 1 tablet (25 mg total) by mouth daily. 05/26/16   Brunetta Jeans, PA-C  gabapentin (NEURONTIN) 100 MG capsule Take 1 capsule (100 mg total) by mouth 3 (three) times daily. Patient not taking: Reported on 06/16/2016 05/12/16   Brunetta Jeans, PA-C  Multiple Vitamins-Minerals (MENS MULTIVITAMIN PLUS) TABS Take by mouth daily. Reported on 04/30/2016    Historical Provider, MD  potassium chloride SA (K-DUR,KLOR-CON) 20 MEQ tablet Take 1 tablet (20 mEq total) by mouth daily. Patient not taking: Reported on 06/16/2016 05/26/16   Brunetta Jeans, PA-C  tiZANidine (ZANAFLEX) 2 MG tablet Take 1 tablet (2 mg total) by mouth at bedtime. 06/16/16   Brunetta Jeans, PA-C  traZODone (DESYREL) 50 MG tablet Take 0.5 tablets (25 mg total) by mouth at bedtime as needed for sleep. 04/30/16   Brunetta Jeans, PA-C    Family History Family History  Problem Relation Age of Onset  . Hypertension Father 48    Deceased  . Heart disease Father   . Lung cancer Mother 3    Deceased  . Healthy Sister     x3  . Healthy Brother  x4  . Heart disease Brother     #5  . Other Daughter     Alpha Thalassemia  . Lupus Daughter     #2-deceased  . Diabetes Neg Hx   . Heart attack Neg Hx   . Hyperlipidemia Neg Hx   . Sudden death Neg Hx     Social History Social History  Substance Use Topics  . Smoking status: Never Smoker  . Smokeless tobacco: Never Used  . Alcohol use No     Allergies   Atenolol; Crestor [rosuvastatin calcium]; Losartan; Terazosin; Amlodipine; Clonidine; Hydralazine; Hydrocodone-acetaminophen; Levofloxacin; Olmesartan; and Other   Review of Systems Review of Systems 10 Systems reviewed and are negative for acute change except as noted in the HPI.   Physical Exam Updated Vital Signs BP 125/76   Pulse 60   Temp 98.6 F (37 C) (Oral)   Resp 18   Ht 5'  8" (1.727 m)   Wt 92.1 kg   SpO2 100%   BMI 30.87 kg/m   Physical Exam  Constitutional: He is oriented to person, place, and time.  HENT:  Right Ear: External ear normal.  Left Ear: External ear normal.  Nose: Nose normal.  Mouth/Throat: No oropharyngeal exudate.  MM dry  Eyes: Conjunctivae and EOM are normal. Pupils are equal, round, and reactive to light.  Neck: Normal range of motion. Neck supple.  Cardiovascular: Normal rate, regular rhythm, normal heart sounds and intact distal pulses.   Pulmonary/Chest: Effort normal and breath sounds normal. No respiratory distress. He has no wheezes. He exhibits no tenderness.  Abdominal: Soft. Bowel sounds are normal. He exhibits no distension. There is no tenderness. There is no rebound and no guarding.  Musculoskeletal: He exhibits no edema.  Neurological: He is alert and oriented to person, place, and time. No cranial nerve deficit.  5/5 strength bilateral UE and LE Normal finger to nose No pronator drift  Skin: Skin is warm and dry.  Psychiatric: He has a normal mood and affect.  Nursing note and vitals reviewed.  Orthostatic VS for the past 24 hrs:  BP- Lying Pulse- Lying BP- Sitting Pulse- Sitting BP- Standing at 0 minutes Pulse- Standing at 0 minutes  06/20/16 1126 136/66 69 114/76 60 112/67 78       ED Treatments / Results  Labs (all labs ordered are listed, but only abnormal results are displayed) Labs Reviewed  BASIC METABOLIC PANEL - Abnormal; Notable for the following:       Result Value   Sodium 133 (*)    Potassium 3.0 (*)    Chloride 100 (*)    Glucose, Bld 126 (*)    Calcium 8.7 (*)    GFR calc non Af Amer 58 (*)    All other components within normal limits  CBG MONITORING, ED - Abnormal; Notable for the following:    Glucose-Capillary 184 (*)    All other components within normal limits  CBC  URINALYSIS, ROUTINE W REFLEX MICROSCOPIC (NOT AT [facility])  I-STAT TROPOININ, ED    EKG  EKG  Interpretation  Date/Time:  Sunday June 20 2016 12:17:09 EDT Ventricular Rate:  83 PR Interval:  144 QRS Duration: 144 QT Interval:  462 QTC Calculation: 470 R Axis:   -51 Text Interpretation:  Sinus rhythm Multiple premature complexes, vent & supraven Right bundle branch block Inferior infarct, old similar to earlier in the day Confirmed by GOLDSTON MD, SCOTT [phone]) on 06/20/2016 12:33:36 PM       Radiology  No results found.  Procedures Procedures (including critical care time)  Medications Ordered in ED Medications  sodium chloride 0.9 % bolus 500 mL (0 mLs Intravenous Stopped 06/20/16 1219)  potassium chloride SA (K-DUR,KLOR-CON) CR tablet 60 mEq (60 mEq Oral Given 06/20/16 1134)     Initial Impression / Assessment and Plan / ED Course  I have reviewed the triage vital signs and the nursing notes.  Pertinent labs & imaging results that were available during my care of the patient were reviewed by me and considered in my medical decision making (see chart for details).  Clinical Course    Pt presenting after syncopal episode. Stood up and passed out. He does have some orthostasis and endorses a long history of labile blood pressures and heart rates. He feels better in the ED. However, he has never completely passed out before. His EKG is irregular and different from priors. With his age especially we will call the hospitalist team for admission.  12:50 PM I spoke with Tye Savoy of the hospitalist team who will come see and admit pt. In the meantime we will add CXR and CT head.  Final Clinical Impressions(s) / ED Diagnoses   Final diagnoses:  Syncope, unspecified syncope type    New Prescriptions New Prescriptions   No medications on file     Anne Ng, Hershal Coria 06/20/16 1251    Sherwood Gambler, MD 06/23/16 [phone]

## 2016-06-20 NOTE — ED Notes (Signed)
Attempted report 

## 2016-06-20 NOTE — ED Notes (Signed)
Patient returned from radiology

## 2016-06-20 NOTE — ED Triage Notes (Signed)
Patient here after syncopal episode this am after eating breakfast around 0800. EMS was called to home and he was told abnormal EKG. Denies any pain from syncopal episode. No CP, no distress. Alert and oriented

## 2016-06-20 NOTE — H&P (Signed)
History and Physical    ZAIVEN OSTERHOUDT [id number] DOB: May 28, 1946 DOA: 06/20/2016  PCP: Leeanne Rio, PA-C  Patient coming from:   Home    Chief Complaint:  passed out  HPI: Patrick Roth is a 70 y.o. male with a medical history significant for, but not  limited to,  remote CVA, hypertension and hyperlipidemia. Patient had a syncopal episode after eating breakfast this morning. He was arising from the table when vision blurred and he passed out on the floor and presence of wife. Patient's wife states that his eyes were open the whole time that he was unresponsive for about 20 seconds. He finally gained consciousness but was confused. Wife helped the patient to the couch he remained confused for a few more minutes.. Patient had an aspirin at home, no other home meds . Previous episodes of syncope. No chest pain or shortness of breath. Patient states he has a long history of irregular heartbeat.Marland Kitchen Hypertension he has been intolerant to multiple medications. Antihypertension meds cause him to have difficulty breathing and irregular heartbeat. Approximately 3 weeks ago he was started on Hygroton and this has been the only medication found to be tolerable. Patient believes he has been drinking adequate fluids. He is certainly urinating adequate amounts.   ED Course:  Afebrile, heart rate 54-63,  Orthostatic with drop in SBP 136 to 112 from lying to standing.  Potassium 3.0, normal renal function, glucose 126 POC troponin 0.02, CBC unremarkable Urinalysis unremarkable No acute findings on chest x-ray or head CT scan  500 cc fluid bolus  Review of Systems: Chronic dyspnea on exertion (1-2 years) . As per HPI, otherwise 10 point review of systems negative.    Past Medical History:  Diagnosis Date  . Colon polyps   . History of chicken pox   . Hyperlipidemia   . Hypertension   . Internal hemorrhoids   . Sciatica   . Stroke [facility]) 2008    Past Surgical History:  Procedure  Laterality Date  . COLONOSCOPY  2011  . great toe surgery    . PROSTATE BIOPSY      Social History   Social History  . Marital status: Married    Spouse name: N/A  . Number of children: 1  . Years of education: 7   Occupational History  . Retired    Social History Main Topics  . Smoking status: Never Smoker  . Smokeless tobacco: Never Used  . Alcohol use No  . Drug use: No  . Sexual activity: Not on file   Other Topics Concern  . Not on file   Social History Narrative   Fun: Play golf and build furniture.   Married, lives at home with wife. No assistive devices needed for ambulation.  Allergies  Allergen Reactions  . Atenolol Other (See Comments)    Drowsiness and "flu sxs"  . Crestor [Rosuvastatin Calcium] Shortness Of Breath    Muscle cramps  . Losartan Other (See Comments)  . Terazosin Shortness Of Breath and Palpitations    Nerve pain  . Amlodipine Other (See Comments)  . Clonidine Other (See Comments)  . Hydralazine Other (See Comments)    Joint swelling  . Hydrocodone-Acetaminophen Other (See Comments)  . Levofloxacin Other (See Comments)  . Olmesartan Other (See Comments)  . Other Other (See Comments)    Bystolic causing GI Issues    Family History  Problem Relation Age of Onset  . Hypertension Father 22    Deceased  .  Heart disease Father   . Lung cancer Mother 14    Deceased  . Healthy Sister     x3  . Healthy Brother     x4  . Heart disease Brother     #5  . Other Daughter     Alpha Thalassemia  . Lupus Daughter     #2-deceased  . Diabetes Neg Hx   . Heart attack Neg Hx   . Hyperlipidemia Neg Hx   . Sudden death Neg Hx    . Prior to Admission medications   Medication Sig Start Date End Date Taking? Authorizing Provider  aspirin 81 MG tablet Take 81 mg by mouth daily.    Historical Provider, MD  celecoxib (CELEBREX) 100 MG capsule Take 1 capsule (100 mg total) by mouth daily. 04/21/16   Brunetta Jeans, PA-C  chlorthalidone  (HYGROTON) 25 MG tablet Take 1 tablet (25 mg total) by mouth daily. 05/26/16   Brunetta Jeans, PA-C  gabapentin (NEURONTIN) 100 MG capsule Take 1 capsule (100 mg total) by mouth 3 (three) times daily. Patient not taking: Reported on 06/16/2016 05/12/16   Brunetta Jeans, PA-C  Multiple Vitamins-Minerals (MENS MULTIVITAMIN PLUS) TABS Take by mouth daily. Reported on 04/30/2016    Historical Provider, MD  potassium chloride SA (K-DUR,KLOR-CON) 20 MEQ tablet Take 1 tablet (20 mEq total) by mouth daily. Patient not taking: Reported on 06/16/2016 05/26/16   Brunetta Jeans, PA-C  tiZANidine (ZANAFLEX) 2 MG tablet Take 1 tablet (2 mg total) by mouth at bedtime. 06/16/16   Brunetta Jeans, PA-C  traZODone (DESYREL) 50 MG tablet Take 0.5 tablets (25 mg total) by mouth at bedtime as needed for sleep. 04/30/16   Brunetta Jeans, PA-C    Physical Exam: Vitals:   06/20/16 0958 06/20/16 1129 06/20/16 1200 06/20/16 1230  BP: 125/76  126/70 130/75  Pulse: 60   (!) 54  Resp: 18 22 22 24   Temp: 98.6 F (37 C)     TempSrc: Oral     SpO2: 100% 100% 100% 100%  Weight: 92.1 kg (203 lb)     Height: 5\' 8"  (1.727 m)       Constitutional:  Pleasant well-developed black male in NAD, calm, comfortable Vitals:   06/20/16 0958 06/20/16 1129 06/20/16 1200 06/20/16 1230  BP: 125/76  126/70 130/75  Pulse: 60   (!) 54  Resp: 18 22 22 24   Temp: 98.6 F (37 C)     TempSrc: Oral     SpO2: 100% 100% 100% 100%  Weight: 92.1 kg (203 lb)     Height: 5\' 8"  (1.727 m)      Eyes: PER, lids and conjunctivae normal ENMT: Mucous membranes are moist. Posterior pharynx clear of any exudate or lesions..  Neck: normal, supple, no masses Respiratory: clear to auscultation bilaterally, no wheezing, no crackles. Normal respiratory effort. No accessory muscle use.  Cardiovascular: Irregular rhythm , rate,  no murmurs / rubs / gallops. No extremity edema. 2+ pedal pulses.   Abdomen: no tenderness, no masses palpated. No hepatomegaly.  Bowel sounds positive.  Musculoskeletal: no clubbing / cyanosis. No joint deformity upper and lower extremities. Good ROM, no contractures. Normal muscle tone.  Skin: no rashes, lesions, ulcers.  Neurologic: CN 2-12 grossly intact. Sensation intact, Strength 5/5 in all 4.  Psychiatric: Normal judgment and insight. Alert and oriented x 3. Normal mood.   Labs on Admission: I have personally reviewed following labs and imaging studies   Urine analysis:  Component Value Date/Time   COLORURINE YELLOW 06/20/2016 1215   APPEARANCEUR CLEAR 06/20/2016 1215   LABSPEC 1.014 06/20/2016 1215   PHURINE 5.5 06/20/2016 1215   GLUCOSEU NEGATIVE 06/20/2016 1215   GLUCOSEU NEGATIVE 05/10/2016 1328   HGBUR NEGATIVE 06/20/2016 1215   BILIRUBINUR NEGATIVE 06/20/2016 1215   BILIRUBINUR neg 04/21/2016 [place] 06/20/2016 1215   PROTEINUR NEGATIVE 06/20/2016 1215   UROBILINOGEN 0.2 05/10/2016 1328   NITRITE NEGATIVE 06/20/2016 1215   LEUKOCYTESUR NEGATIVE 06/20/2016 1215    Radiological Exams on Admission: Dg Chest 2 View  Result Date: 06/20/2016 CLINICAL DATA:  Syncope. EXAM: CHEST  2 VIEW COMPARISON:  04/22/2016 and prior exams FINDINGS: Upper limits normal heart size again noted. Mild peribronchial thickening again noted. There is no evidence of focal airspace disease, pulmonary edema, suspicious pulmonary nodule/mass, pleural effusion, or pneumothorax. No acute bony abnormalities are identified. IMPRESSION: Upper limits normal heart size without evidence of acute cardiopulmonary disease. Electronically Signed   By: Margarette Canada M.D.   On: 06/20/2016 13:28  Ct Head Wo Contrast  Result Date: 06/20/2016 CLINICAL DATA:  Syncope.  Loss of consciousness. EXAM: CT HEAD WITHOUT CONTRAST TECHNIQUE: Contiguous axial images were obtained from the base of the skull through the vertex without intravenous contrast. COMPARISON:  11/28/2007 FINDINGS: No acute intracranial abnormality. Specifically,  no hemorrhage, hydrocephalus, mass lesion, acute infarction, or significant intracranial injury. No acute calvarial abnormality. Visualized paranasal sinuses and mastoids clear. Orbital soft tissues unremarkable. IMPRESSION: No acute intracranial abnormality. Electronically Signed   By: Rolm Baptise M.D.   On: 06/20/2016 14:07   EKG: Independently reviewed.   EKG Interpretation  Date/Time:  Sunday June 20 2016 12:17:09 EDT Ventricular Rate:  83 PR Interval:  144 QRS Duration: 144 QT Interval:  462 QTC Calculation: 470 R Axis:   -51 Text Interpretation:  Sinus rhythm Multiple premature complexes, vent & supraven Right bundle branch block Inferior infarct, old similar to earlier in the day Confirmed by GOLDSTON MD, [place] [phone]) on 06/20/2016 12:33:36 PM     Echocardiogram December 2015. Normal systolic function, mild diastolic dysfunction. Mild pulmonary hypertension.   Assessment/Plan   Active Problems:   Essential hypertension   Syncope      Syncope. Orthostatic in ED. Also, irregular heart rhythm on exam. Multiple PVCs on EKG.  -obs - telemetry. .            -Syncope order set utilized -Received 500 cc fluid bolus in ED. Will add 59ml hr NS for total of 513ml.  -Hold home blood pressure medication -Follow-up on echocardiogram, carotid dopplers  Hypertension, orthostatic in ED. Intolerance to multiple antihypertensive medications (tachycardia, shortness of breath). Started on Hygroton 3 weeks ago and tolerating. -Hold home Hygroton   Remote left thalmic hemorrhage 2008  DVT prophylaxis:   Lovenox  Code Status:   Full code  Family Communication:     Treatment plan discussed with wife in the room and she understands and agrees with the plan..  Disposition Plan:  Discharge home in 24-48 hours              Consults called:  None  Admission status:  Observation -   Telemetry    Tye Savoy NP [facility] Pager [phone]  If 7PM-7AM, please contact  night-coverage [email or web address] Password [email or web address]  06/20/2016, 1:04 PM

## 2016-06-20 NOTE — Progress Notes (Signed)
Patient arrived in the unit accompanied by NT via stretcher. Orientation to the unit given. Patient verbalizes understanding. 

## 2016-06-21 ENCOUNTER — Observation Stay (HOSPITAL_BASED_OUTPATIENT_CLINIC_OR_DEPARTMENT_OTHER): Payer: Medicare Other

## 2016-06-21 DIAGNOSIS — R55 Syncope and collapse: Principal | ICD-10-CM

## 2016-06-21 DIAGNOSIS — I10 Essential (primary) hypertension: Secondary | ICD-10-CM | POA: Diagnosis not present

## 2016-06-21 LAB — CBC
HCT: 45.6 % (ref 39.0–52.0)
Hemoglobin: 14.6 g/dL (ref 13.0–17.0)
MCH: 28.2 pg (ref 26.0–34.0)
MCHC: 32 g/dL (ref 30.0–36.0)
MCV: 88.2 fL (ref 78.0–100.0)
PLATELETS: 148 10*3/uL — AB (ref 150–400)
RBC: 5.17 MIL/uL (ref 4.22–5.81)
RDW: 13.8 % (ref 11.5–15.5)
WBC: 5 10*3/uL (ref 4.0–10.5)

## 2016-06-21 LAB — BASIC METABOLIC PANEL
Anion gap: 6 (ref 5–15)
BUN: 12 mg/dL (ref 6–20)
CALCIUM: 8.5 mg/dL — AB (ref 8.9–10.3)
CO2: 29 mmol/L (ref 22–32)
CREATININE: 1.18 mg/dL (ref 0.61–1.24)
Chloride: 104 mmol/L (ref 101–111)
GFR calc non Af Amer: >60 mL/min (ref >60)
Glucose, Bld: 109 mg/dL — ABNORMAL HIGH (ref 65–99)
Potassium: 3.3 mmol/L — ABNORMAL LOW (ref 3.5–5.1)
SODIUM: 139 mmol/L (ref 135–145)

## 2016-06-21 LAB — VAS US CAROTID
RCCADSYS: -47 cm/s
RIGHT ECA DIAS: -10 cm/s
RIGHT VERTEBRAL DIAS: 12 cm/s
Right CCA prox dias: 14 cm/s
Right CCA prox sys: 91 cm/s

## 2016-06-21 LAB — ECHOCARDIOGRAM COMPLETE
Height: 68 in
Weight: 3132.8 oz

## 2016-06-21 MED ORDER — POTASSIUM CHLORIDE CRYS ER 20 MEQ PO TBCR
40.0000 meq | EXTENDED_RELEASE_TABLET | Freq: Once | ORAL | Status: AC
  Administered 2016-06-21: 40 meq via ORAL
  Filled 2016-06-21: qty 2

## 2016-06-21 NOTE — Progress Notes (Addendum)
Patient ID: Patrick Roth, male   DOB: 1946-09-23, 70 y.o.   MRN: [id number]    PROGRESS NOTE    Patrick Roth  [id number] DOB: 29-Sep-1946 DOA: 06/20/2016  PCP: Leeanne Rio, PA-C   Brief Narrative:  70 y.o. male with remote CVA, hypertension and hyperlipidemia presented for evaluation of syncopal event that occurred several minutes after eating breakfast the day of the admission. Patient explains he was getting out of the chair and his vision suddenly became blurry and he passed out on the floor. His wife witnessed the event and explained that his eyes were open the entire time and the patient was unresponsive for about 20 seconds. Wife also explained that patient gained consciousness but remained confused for several minutes. She was able to get him to the couch. Wife explained patient has a long history of irregular heartbeat and hypertension and has been intolerant to multiple medications. 3 weeks ago he was started on Hygroton and this has been the only medication found to be tolerable.  Assessment & Plan:   Syncope, positive orthostatic vitals in emergency department - Unclear etiology at this time - We will repeat orthostatic vitals this morning - CT head with no signs of acute infarction or bleed - Echocardiogram and carotid Dopplers are pending - Patient has received IV fluids and is able to tolerate by mouth, we'll therefore stop IV fluids - Continue to monitor on telemetry - Further recommendations pending orthostatic vitals, echocardiogram and carotid Doppler results - We'll consider cardiology consultation based on the results above - PT evaluation pending  Hypertension, Essential - Blood pressure stable this morning - Patient's home blood pressure medications have been held for now  Hyponatremia - Appears to be prerenal in etiology - Resolved with IV fluids  Hypokalemia - Supplement and repeat BMP in the morning  Thrombocytopenia - Mild, likely reactive -  No signs of bleeding - CBC in the morning  Remote left thalmic hemorrhage   DVT prophylaxis: Lovenox SQ Code Status: Full code Family Communication: Patient at bedside  Disposition Plan: Home pending test results mentioned above  Consultants:   None  Procedures:   Echocardiogram  Carotid Dopplers  Antimicrobials:   None  Subjective: No events overnight  Objective: Vitals:   06/20/16 1613 06/20/16 1616 06/20/16 2245 06/21/16 0430  BP:   134/80 126/76  Pulse: 67 67 (!) 59 66  Resp:   16 17  Temp:   98.4 F (36.9 C) 98.2 F (36.8 C)  TempSrc:   Oral Oral  SpO2:   99% 98%  Weight:    88.8 kg (195 lb 12.8 oz)  Height:        Intake/Output Summary (Last 24 hours) at 06/21/16 0936 Last data filed at 06/21/16 0809  Gross per 24 hour  Intake              720 ml  Output             1050 ml  Net             -330 ml   Filed Weights   06/20/16 0958 06/20/16 1420 06/21/16 0430  Weight: 92.1 kg (203 lb) 93.4 kg (206 lb) 88.8 kg (195 lb 12.8 oz)    Examination:  General exam: Appears calm and comfortable  Respiratory system: Respiratory effort normal. Cardiovascular system: S1 & S2 heard, RRR. No JVD, rubs, gallops or clicks. No pedal edema. Gastrointestinal system: Abdomen is nondistended, soft and nontender. No organomegaly or  masses felt. Normal bowel sounds heard. Central nervous system: Alert and oriented. No focal neurological deficits.  Data Reviewed: I have personally reviewed following labs and imaging studies  CBC:  Recent Labs Lab 06/20/16 1028 06/21/16 0317  WBC 5.2 5.0  HGB 16.2 14.6  HCT 49.7 45.6  MCV 87.8 88.2  PLT 171 123456*   Basic Metabolic Panel:  Recent Labs Lab 06/20/16 1028 06/21/16 0317  NA 133* 139  K 3.0* 3.3*  CL 100* 104  CO2 25 29  GLUCOSE 126* 109*  BUN 14 12  CREATININE 1.21 1.18  CALCIUM 8.7* 8.5*   BNP (last 3 results)  Recent Labs  01/29/16 1456  PROBNP 152.0*   CBG:  Recent Labs Lab 06/20/16 1003    GLUCAP 184*   Urine analysis:    Component Value Date/Time   COLORURINE YELLOW 06/20/2016 [place] 06/20/2016 1215   LABSPEC 1.014 06/20/2016 1215   PHURINE 5.5 06/20/2016 1215   GLUCOSEU NEGATIVE 06/20/2016 1215   GLUCOSEU NEGATIVE 05/10/2016 1328   HGBUR NEGATIVE 06/20/2016 1215   BILIRUBINUR NEGATIVE 06/20/2016 1215   BILIRUBINUR neg 04/21/2016 1148   KETONESUR NEGATIVE 06/20/2016 1215   PROTEINUR NEGATIVE 06/20/2016 1215   UROBILINOGEN 0.2 05/10/2016 1328   NITRITE NEGATIVE 06/20/2016 1215   LEUKOCYTESUR NEGATIVE 06/20/2016 1215   Radiology Studies: Dg Chest 2 View  Result Date: 06/20/2016 CLINICAL DATA:  Syncope. EXAM: CHEST  2 VIEW COMPARISON:  04/22/2016 and prior exams FINDINGS: Upper limits normal heart size again noted. Mild peribronchial thickening again noted. There is no evidence of focal airspace disease, pulmonary edema, suspicious pulmonary nodule/mass, pleural effusion, or pneumothorax. No acute bony abnormalities are identified. IMPRESSION: Upper limits normal heart size without evidence of acute cardiopulmonary disease. Electronically Signed   By: Margarette Canada M.D.   On: 06/20/2016 13:28  Ct Head Wo Contrast  Result Date: 06/20/2016 CLINICAL DATA:  Syncope.  Loss of consciousness. EXAM: CT HEAD WITHOUT CONTRAST TECHNIQUE: Contiguous axial images were obtained from the base of the skull through the vertex without intravenous contrast. COMPARISON:  11/28/2007 FINDINGS: No acute intracranial abnormality. Specifically, no hemorrhage, hydrocephalus, mass lesion, acute infarction, or significant intracranial injury. No acute calvarial abnormality. Visualized paranasal sinuses and mastoids clear. Orbital soft tissues unremarkable. IMPRESSION: No acute intracranial abnormality. Electronically Signed   By: Rolm Baptise M.D.   On: 06/20/2016 14:07   Scheduled Meds: . aspirin EC  81 mg Oral Daily  . enoxaparin (LOVENOX) injection  40 mg Subcutaneous Q24H  .  potassium chloride  40 mEq Oral Once  . sodium chloride flush  3 mL Intravenous Q12H  . tiZANidine  2 mg Oral QHS   Continuous Infusions:    LOS: 0 days    Time spent: 20 minutes   Faye Ramsay, MD [facility] Pager [phone]  If 7PM-7AM, please contact night-coverage [email or web address] Password [facility] 06/21/2016, 9:36 AM

## 2016-06-21 NOTE — Discharge Instructions (Signed)

## 2016-06-21 NOTE — Evaluation (Signed)
Physical Therapy Evaluation Patient Details Name: Patrick Roth MRN: [id number] DOB: Sep 21, 1946 Today's Date: 06/21/2016   History of Present Illness  70 y.o. male with h/o HTN admitted with syncope.   Clinical Impression  Pt is independent with mobility, he ambulated 350' without an assistive device with no dizziness nor loss of balance with challenges to balance. Pt able to negotiate obstacles well when walking. From PT standpoint he is ready to DC home, no further PT needed. PT signing off.     Follow Up Recommendations No PT follow up    Equipment Recommendations  None recommended by PT    Recommendations for Other Services       Precautions / Restrictions Precautions Precautions: Other (comment) Precaution Comments: recent syncopal event, no other prior falls in past year per pt Restrictions Weight Bearing Restrictions: No      Mobility  Bed Mobility Overal bed mobility: Independent                Transfers Overall transfer level: Independent                  Ambulation/Gait Ambulation/Gait assistance: Independent Ambulation Distance (Feet): 350 Feet Assistive device: None Gait Pattern/deviations: WFL(Within Functional Limits)   Gait velocity interpretation: at or above normal speed for age/gender General Gait Details: steady without AD, no LOB with turning head side to side nor with looking up/down while walking  Stairs            Wheelchair Mobility    Modified Rankin (Stroke Patients Only)       Balance Overall balance assessment: Independent                                           Pertinent Vitals/Pain Pain Assessment: No/denies pain    Home Living Family/patient expects to be discharged to:: Private residence Living Arrangements: Spouse/significant other Available Help at Discharge: Family;Available 24 hours/day Type of Home: House         Home Equipment: None      Prior Function Level of  Independence: Independent         Comments: pt builds Doctor, general practice, does Engineer, structural        Extremity/Trunk Assessment   Upper Extremity Assessment: Overall WFL for tasks assessed           Lower Extremity Assessment: Overall WFL for tasks assessed (pt reports h/o tingling in B feet due to sciatica, sensation intact to light touch; strength 5/5 BLEs)      Cervical / Trunk Assessment: Normal  Communication   Communication: No difficulties  Cognition Arousal/Alertness: Awake/alert Behavior During Therapy: WFL for tasks assessed/performed Overall Cognitive Status: Within Functional Limits for tasks assessed                      General Comments      Exercises        Assessment/Plan    PT Assessment Patent does not need any further PT services  PT Diagnosis     PT Problem List    PT Treatment Interventions     PT Goals (Current goals can be found in the Care Plan section) Acute Rehab PT Goals 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 PT Goal Formulation: All assessment and education complete, DC therapy    Frequency  Barriers to discharge        Co-evaluation               End of Session Equipment Utilized During Treatment: Gait belt Activity Tolerance: Patient tolerated treatment well Patient left: in chair;with call bell/phone within reach Nurse Communication: Mobility status (pt safe to walk in halls independently)    Functional Limitation: Mobility: Walking and moving around Mobility: Walking and Moving Around Current Status [phone]): 0 percent impaired, limited or restricted Mobility: Walking and Moving Around Goal Status [phone]): 0 percent impaired, limited or restricted Mobility: Walking and Moving Around Discharge Status [phone]): 0 percent impaired, limited or restricted    Time: [id number] PT Time Calculation (min) (ACUTE ONLY): 15 min   Charges:   PT Evaluation $PT Eval Low  Complexity: 1 Procedure     PT G Codes:   PT G-Codes **NOT FOR INPATIENT CLASS** Functional Limitation: Mobility: Walking and moving around Mobility: Walking and Moving Around Current Status [id number]): 0 percent impaired, limited or restricted Mobility: Walking and Moving Around Goal Status [id number]): 0 percent impaired, limited or restricted Mobility: Walking and Moving Around Discharge Status [phone]): 0 percent impaired, limited or restricted    Philomena Doheny 06/21/2016, 2:35 PM [phone]

## 2016-06-21 NOTE — Progress Notes (Signed)
  Echocardiogram 2D Echocardiogram has been performed.  Jennette Dubin 06/21/2016, 12:05 PM

## 2016-06-21 NOTE — Progress Notes (Signed)
**  Preliminary report by tech**  Carotid duplex completed. Findings consistent with mild plaque formations with no evidence of hemodynamically significant stenosis bilaterally. Vertebral arteries are patent with antegrade flow bilaterally.  06/21/16 11:44 AM  Patrick Roth RVT

## 2016-06-21 NOTE — Discharge Summary (Signed)
Physician Discharge Summary  Patrick Roth [id number] DOB: 01/10/1946 DOA: 06/20/2016  PCP: Leeanne Rio, PA-C  Admit date: 06/20/2016 Discharge date: 06/21/2016  Recommendations for Outpatient Follow-up:  1. Pt will need to follow up with PCP in 2-3 weeks post discharge  Discharge Diagnoses:  Active Problems:   Essential hypertension   Syncope  Discharge Condition: Stable  Diet recommendation: Heart healthy diet discussed in details   History of present illness:  70 y.o.malewith remote CVA, hypertension and hyperlipidemia presented for evaluation of syncopal event that occurred several minutes after eating breakfast the day of the admission. Patient explains he was getting out of the chair and his vision suddenly became blurry and he passed out on the floor. His wife witnessed the event and explained that his eyes were open the entire time and the patient was unresponsive for about 20 seconds. Wife also explained that patient gained consciousness but remained confused for several minutes. She was able to get him to the couch. Wife explained patient has a long history of irregular heartbeat and hypertension and has been intolerant to multiple medications. 3 weeks ago he was started on Hygroton and this has been the only medication found to be tolerable.  Assessment & Plan:   Syncope, positive orthostatic vitals in emergency department - Unclear etiology at this time - CT head with no signs of acute infarction or bleed - Echocardiogram and carotid Dopplers done, ECHO with EF 55 % and grade II diastolic CHF for which pt already on Lasix  - Patient has received IV fluids and is able to tolerate by mouth intake, IVF stopped  - pt insisting on going home today   Hypertension, Essential - Blood pressure stable this morning - resume home  Blood pressure medications   Hyponatremia - Appears to be prerenal in etiology - Resolved with IV fluids  Hypokalemia -  Supplemented prior to discharge   Thrombocytopenia - Mild, likely reactive - No signs of bleeding  Remote left thalmic hemorrhage   DVT prophylaxis: Lovenox SQ Code Status: Full code Family Communication: Patient at bedside  Disposition Plan: Home   Consultants:   None  Procedures:   Echocardiogram  Carotid Dopplers  Antimicrobials:   None  Procedures/Studies: Dg Chest 2 View  Result Date: 06/20/2016 CLINICAL DATA:  Syncope. EXAM: CHEST  2 VIEW COMPARISON:  04/22/2016 and prior exams FINDINGS: Upper limits normal heart size again noted. Mild peribronchial thickening again noted. There is no evidence of focal airspace disease, pulmonary edema, suspicious pulmonary nodule/mass, pleural effusion, or pneumothorax. No acute bony abnormalities are identified. IMPRESSION: Upper limits normal heart size without evidence of acute cardiopulmonary disease. Electronically Signed   By: Margarette Canada M.D.   On: 06/20/2016 13:28  Ct Head Wo Contrast  Result Date: 06/20/2016 CLINICAL DATA:  Syncope.  Loss of consciousness. EXAM: CT HEAD WITHOUT CONTRAST TECHNIQUE: Contiguous axial images were obtained from the base of the skull through the vertex without intravenous contrast. COMPARISON:  11/28/2007 FINDINGS: No acute intracranial abnormality. Specifically, no hemorrhage, hydrocephalus, mass lesion, acute infarction, or significant intracranial injury. No acute calvarial abnormality. Visualized paranasal sinuses and mastoids clear. Orbital soft tissues unremarkable. IMPRESSION: No acute intracranial abnormality. Electronically Signed   By: Rolm Baptise M.D.   On: 06/20/2016 14:07  Mr Lumbar Spine Wo Contrast  Result Date: 05/29/2016 CLINICAL DATA:  70 year old male with chronic lumbar back pain, increasing in severity. Recurrent sciatica, pain radiating to both hips buttocks, legs, an the rectum. Subsequent encounter. EXAM:  MRI LUMBAR SPINE WITHOUT CONTRAST TECHNIQUE: Multiplanar,  multisequence MR imaging of the lumbar spine was performed. No intravenous contrast was administered. COMPARISON:  Lumbar radiographs 02/13/2016 and earlier. [facility] CT Abdomen and Pelvis 11/19/2011. FINDINGS: Segmentation:  Normal, as seen on the comparison radiographs. Alignment: Chronic straightening of lumbar lordosis. Stable vertebral height and alignment. Vertebrae: No marrow edema or evidence of acute osseous abnormality. Visible sacrum and SI joints appear intact. Anterior superior SI joint degenerative spurring slightly greater on the right. Conus medullaris: Extends to the L1 level and appears normal. Paraspinal and other soft tissues: Visualized abdominal viscera and paraspinal soft tissues are within normal limits. Disc levels: T12-L1:  Negative aside from mild right facet hypertrophy. L1-L2:  Minimal disc bulge.  Mild facet hypertrophy.  No stenosis. L2-L3: Disc space loss. Circumferential disc bulge. Mild endplate spurring. Mild right L2 foraminal stenosis. L3-L4: Disc space loss. Circumferential disc bulge with broad-based posterior component. Mild endplate spurring. Borderline to mild spinal and bilateral L3 foraminal stenosis. L4-L5: Circumferential disc bulge with broad-based posterior component but superimposed left subarticular disc extrusion with annular fissure (series 5, image 23 and series 3, image 11). Superimposed mild facet and ligament flavum hypertrophy. Subsequent moderate left lateral recess stenosis (descending left L5 nerve root level). No significant spinal stenosis. Endplate spurring contributes to mild left L4 foraminal stenosis. L5-S1:  Negative aside from mild facet hypertrophy. IMPRESSION: 1. L4-L5 disc degeneration including left subarticular disc herniation in large part responsible for moderate left lateral recess stenosis at the level of the descending left L5 nerve root. Superimposed mild left L4 foraminal stenosis. 2. Lumbar disc and posterior element  degeneration at L2-L3 and L3-L4 resulting in up to mild spinal and foraminal stenosis. 3. No other lumbar neural impingement. Electronically Signed   By: Genevie Ann M.D.   On: 05/29/2016 10:48    Discharge Exam: Vitals:   06/21/16 0430 06/21/16 1500  BP: 126/76 138/68  Pulse: 66 65  Resp: 17 18  Temp: 98.2 F (36.8 C) 97.4 F (36.3 C)   Vitals:   06/20/16 1616 06/20/16 2245 06/21/16 0430 06/21/16 1500  BP:  134/80 126/76 138/68  Pulse: 67 (!) 59 66 65  Resp:  16 17 18   Temp:  98.4 F (36.9 C) 98.2 F (36.8 C) 97.4 F (36.3 C)  TempSrc:  Oral Oral Oral  SpO2:  99% 98% 100%  Weight:   88.8 kg (195 lb 12.8 oz)   Height:        General: Pt is alert, follows commands appropriately, not in acute distress Cardiovascular: Regular rate and rhythm, S1/S2 +, no rubs, no gallops Respiratory: Clear to auscultation bilaterally, no wheezing, no crackles, no rhonchi Abdominal: Soft, non tender, non distended, bowel sounds +, no guarding   Discharge Instructions  Discharge Instructions    Diet - low sodium heart healthy    Complete by:  As directed   Increase activity slowly    Complete by:  As directed       Medication List    STOP taking these medications   celecoxib 100 MG capsule Commonly known as:  CELEBREX     TAKE these medications   aspirin 81 MG tablet Take 81 mg by mouth daily.   chlorthalidone 25 MG tablet Commonly known as:  HYGROTON Take 1 tablet (25 mg total) by mouth daily.   doxazosin 2 MG tablet Commonly known as:  CARDURA Take 2 mg by mouth at bedtime as needed.   gabapentin 100  MG capsule Commonly known as:  NEURONTIN Take 1 capsule (100 mg total) by mouth 3 (three) times daily.   MENS MULTIVITAMIN PLUS Tabs Take by mouth daily. Reported on 04/30/2016   potassium chloride SA 20 MEQ tablet Commonly known as:  K-DUR,KLOR-CON Take 1 tablet (20 mEq total) by mouth daily. What changed:  when to take this  reasons to take this   tiZANidine 2 MG  tablet Commonly known as:  ZANAFLEX Take 1 tablet (2 mg total) by mouth at bedtime.   traZODone 50 MG tablet Commonly known as:  DESYREL Take 0.5 tablets (25 mg total) by mouth at bedtime as needed for sleep.      Follow-up Information    Leeanne Rio, PA-C .   Specialty:  Family Medicine Contact information: [place] [address] [place] [address] [phone]        Faye Ramsay, MD .   Specialty:  Internal Medicine Contact information: [address] [place] [address] [phone]            The results of significant diagnostics from this hospitalization (including imaging, microbiology, ancillary and laboratory) are listed below for reference.     Microbiology: No results found for this or any previous visit (from the past 240 hour(s)).   Labs: Basic Metabolic Panel:  Recent Labs Lab 06/20/16 1028 06/21/16 0317  NA 133* 139  K 3.0* 3.3*  CL 100* 104  CO2 25 29  GLUCOSE 126* 109*  BUN 14 12  CREATININE 1.21 1.18  CALCIUM 8.7* 8.5*   CBC:  Recent Labs Lab 06/20/16 1028 06/21/16 0317  WBC 5.2 5.0  HGB 16.2 14.6  HCT 49.7 45.6  MCV 87.8 88.2  PLT 171 148*    ProBNP (last 3 results)  Recent Labs  01/29/16 1456  PROBNP 152.0*    CBG:  Recent Labs Lab 06/20/16 1003  GLUCAP 184*   SIGNED: Time coordinating discharge:  30 minutes  Faye Ramsay, MD  [facility] 06/21/2016, 4:53 PM Pager [phone]  If 7PM-7AM, please contact night-coverage [email or web address] Password [email or web address]

## 2016-06-22 ENCOUNTER — Telehealth: Payer: Self-pay | Admitting: Physician Assistant

## 2016-06-22 NOTE — Telephone Encounter (Signed)
Pt has a hospital follow up on 06/28/16 with PCP.

## 2016-06-22 NOTE — Telephone Encounter (Signed)
Patient needs hospital follow-up scheduled.  He is supposed to be taking the potassium supplement daily as directed with the chlorthalidone. History of noncompliance with instructions.

## 2016-06-22 NOTE — Telephone Encounter (Signed)
Please advise 

## 2016-06-22 NOTE — Telephone Encounter (Signed)
Pt called in to make PCP aware that he passed out due to his potassium was low and was hospitalized for a 1 1/2.  Pt says that he is currently taking a potassium medication.

## 2016-06-23 ENCOUNTER — Telehealth: Payer: Self-pay | Admitting: Physician Assistant

## 2016-06-23 NOTE — Telephone Encounter (Signed)
Pt returned call.  Provider's recommendations discussed with patient.  He stated understanding and agreed with plan.  Pt was advised not to use creams in conjunction with heating pad and not to apply heating pad directly to skin, but instead to use a pillow case.  He stated understanding and agreed to comply.

## 2016-06-23 NOTE — Telephone Encounter (Signed)
Called to follow up with patient.  Left a message for call back.   

## 2016-06-23 NOTE — Telephone Encounter (Signed)
Relation to [id number] Call back number: [phone]    Reason for call:  Patient states tiZANidine (ZANAFLEX) 2 MG tablet may have caused him to faint in the past and medication is causing dizziness. Please advise

## 2016-06-23 NOTE — Telephone Encounter (Signed)
Stop medication. Hydrate well. FU if any recurrence of dizziness. Unfortunately he is unable to tolerate most all medications. For muscle tension -- heating pad 10 minutes 2-3 x days. Recommend massage.

## 2016-06-28 ENCOUNTER — Ambulatory Visit (INDEPENDENT_AMBULATORY_CARE_PROVIDER_SITE_OTHER): Payer: Medicare Other | Admitting: Physician Assistant

## 2016-06-28 ENCOUNTER — Encounter: Payer: Self-pay | Admitting: Physician Assistant

## 2016-06-28 VITALS — BP 136/84 | HR 61 | Temp 98.3°F | Resp 16 | Ht 68.0 in | Wt 205.2 lb

## 2016-06-28 DIAGNOSIS — R55 Syncope and collapse: Secondary | ICD-10-CM

## 2016-06-28 DIAGNOSIS — E871 Hypo-osmolality and hyponatremia: Secondary | ICD-10-CM | POA: Diagnosis not present

## 2016-06-28 DIAGNOSIS — E876 Hypokalemia: Secondary | ICD-10-CM

## 2016-06-28 HISTORY — DX: Hypokalemia: E87.6

## 2016-06-28 HISTORY — DX: Hypo-osmolality and hyponatremia: E87.1

## 2016-06-28 HISTORY — DX: Syncope and collapse: R55

## 2016-06-28 LAB — COMPREHENSIVE METABOLIC PANEL
ALK PHOS: 70 U/L (ref 39–117)
ALT: 25 U/L (ref 0–53)
AST: 26 U/L (ref 0–37)
Albumin: 4.1 g/dL (ref 3.5–5.2)
BILIRUBIN TOTAL: 0.8 mg/dL (ref 0.2–1.2)
BUN: 14 mg/dL (ref 6–23)
CO2: 29 mEq/L (ref 19–32)
CREATININE: 1.2 mg/dL (ref 0.40–1.50)
Calcium: 9.7 mg/dL (ref 8.4–10.5)
Chloride: 100 mEq/L (ref 96–112)
GFR: 76.67 mL/min (ref >60.00)
GLUCOSE: 85 mg/dL (ref 70–99)
Potassium: 3.9 mEq/L (ref 3.5–5.1)
Sodium: 137 mEq/L (ref 135–145)
TOTAL PROTEIN: 7.7 g/dL (ref 6.0–8.3)

## 2016-06-28 NOTE — Patient Instructions (Addendum)
Please continue your medications as directed. Always take your potassium supplement. Do not skip meals and try to stay well hydrated.  No more use of Tizanidine or Trazodone.  Follow-up with Cardiology and Neurosurgery as scheduled.  FU with me in 3 months.  If you note any recurrent symptoms of lightheadedness or dizziness, please call 911 or have someone bring you to the ER.

## 2016-06-28 NOTE — Progress Notes (Signed)
Patient presents to clinic today for hospital follow-up. Patient was admitted to hospital on 06/20/2016 after experiencing a syncopal event. ER workup included chest x-ray and CT head, both without abnormal findings. Was noted to be mildly dehydrated with hypernatremia and hypokalemia. Was given IV fluids and admitted to the hospital for further assessment. During hospitalization echocardiogram was performed revealing ejection fraction of 55% and grade 2 diastolic heart failure, for which patient is already treated and followed by cardiology. Carotid Dopplers performed revealing no concerning findings. No recurrence of symptoms were noted during hospitalization. BP remains stable. As such patient was discharged home with follow-up scheduled with PCP.     Since discharge, patient endorses doing well overall. Denies any recurrence of lightheadedness, dizziness, syncope or fatigue. States the morning of the event, he had taken a Tizanidine about 20 minutes before. Had taken on empty stomach. Feels that may have contributed. Patient has stopped the Tizanidine. Has continue Chlorthalidone as directed and has restarted potassium supplement (previously noncompliant with this medication) to ensure preserved serum potassium. Is checking BP at home and noting readings averaging 120-130s/80s. Denies new concerns today.  Past Medical History:  Diagnosis Date  . Colon polyps   . History of chicken pox   . Hyperlipidemia   . Hypertension   . Internal hemorrhoids   . Sciatica   . Stroke [facility]) 2008    Current Outpatient Prescriptions on File Prior to Visit  Medication Sig Dispense Refill  . aspirin 81 MG tablet Take 81 mg by mouth daily.    . chlorthalidone (HYGROTON) 25 MG tablet Take 1 tablet (25 mg total) by mouth daily. 30 tablet 1  . doxazosin (CARDURA) 2 MG tablet Take 2 mg by mouth at bedtime as needed.  1  . Multiple Vitamins-Minerals (MENS MULTIVITAMIN PLUS) TABS Take by mouth daily. Reported on  04/30/2016    . potassium chloride SA (K-DUR,KLOR-CON) 20 MEQ tablet Take 1 tablet (20 mEq total) by mouth daily. (Patient taking differently: Take 20 mEq by mouth daily as needed (for cramping). ) 30 tablet 1  . traZODone (DESYREL) 50 MG tablet Take 0.5 tablets (25 mg total) by mouth at bedtime as needed for sleep. 30 tablet 0  . gabapentin (NEURONTIN) 100 MG capsule Take 1 capsule (100 mg total) by mouth 3 (three) times daily. (Patient not taking: Reported on 06/16/2016) 30 capsule 3   No current facility-administered medications on file prior to visit.     Allergies  Allergen Reactions  . Crestor [Rosuvastatin Calcium] Shortness Of Breath    Muscle cramps  . Spironolactone Shortness Of Breath  . Terazosin Shortness Of Breath, Palpitations and Other (See Comments)    Nerve pain  . Atenolol Other (See Comments)    Drowsiness and "flu sxs"  . Bystolic [Nebivolol Hcl] Other (See Comments)    GI issues  . Hydralazine Other (See Comments)    Joint swelling  . Tizanidine Other (See Comments)    Syncope, Elevated BP/pulse  . Amlodipine Other (See Comments)  . Clonidine Other (See Comments)  . Hctz [Hydrochlorothiazide] Other (See Comments)    Other reaction(s): Unknown  . Hydrocodone-Acetaminophen Other (See Comments)  . Levofloxacin Other (See Comments)  . Losartan Other (See Comments)  . Maxzide [Hydrochlorothiazide W-Triamterene] Other (See Comments)  . Olmesartan Other (See Comments)  . Tussionex Pennkinetic Er [Hydrocod Polst-Cpm Polst Er] Other (See Comments)    Other reaction(s): Unknown    Family History  Problem Relation Age of Onset  . Hypertension  Father 70    Deceased  . Heart disease Father   . Lung cancer Mother 62    Deceased  . Healthy Sister     x3  . Healthy Brother     x4  . Heart disease Brother     #5  . Other Daughter     Alpha Thalassemia  . Lupus Daughter     #2-deceased  . Diabetes Neg Hx   . Heart attack Neg Hx   . Hyperlipidemia Neg Hx   .  Sudden death Neg Hx     Social History   Social History  . Marital status: Married    Spouse name: N/A  . Number of children: 1  . Years of education: 24   Occupational History  . Retired    Social History Main Topics  . Smoking status: Never Smoker  . Smokeless tobacco: Never Used  . Alcohol use No  . Drug use: No  . Sexual activity: Not Asked   Other Topics Concern  . None   Social History Narrative   Fun: Play golf and build furniture.     Review of Systems - See HPI.  All other ROS are negative.  BP 136/84 (BP Location: Left Arm, Patient Position: Sitting, Cuff Size: Large)   Pulse 61   Temp 98.3 F (36.8 C) (Oral)   Resp 16   Ht 5' 8"  (1.727 m)   Wt 205 lb 4 oz (93.1 kg)   SpO2 98%   BMI 31.21 kg/m   Physical Exam  Constitutional: He is oriented to person, place, and time and well-developed, well-nourished, and in no distress.  HENT:  Head: Normocephalic and atraumatic.  Eyes: Conjunctivae are normal. Pupils are equal, round, and reactive to light.  Neck: Neck supple.  Cardiovascular: Normal rate, regular rhythm, normal heart sounds and intact distal pulses.   Pulmonary/Chest: Effort normal and breath sounds normal. No respiratory distress. He has no wheezes. He has no rales. He exhibits no tenderness.  Neurological: He is alert and oriented to person, place, and time.  Skin: Skin is warm and dry. No rash noted.  Psychiatric: Affect normal.  Vitals reviewed.   Recent Results (from the past 2160 hour(s))  CBC with Differential/Platelet     Status: None   Collection Time: 04/08/16  9:20 PM  Result Value Ref Range   WBC 6.1 4.0 - 10.5 K/uL   RBC 5.20 4.22 - 5.81 MIL/uL   Hemoglobin 15.2 13.0 - 17.0 g/dL   HCT 45.3 39.0 - 52.0 %   MCV 87.1 78.0 - 100.0 fL   MCH 29.2 26.0 - 34.0 pg   MCHC 33.6 30.0 - 36.0 g/dL   RDW 13.4 11.5 - 15.5 %   Platelets 151 150 - 400 K/uL   Neutrophils Relative % 69 %   Neutro Abs 4.2 1.7 - 7.7 K/uL   Lymphocytes  Relative 16 %   Lymphs Abs 1.0 0.7 - 4.0 K/uL   Monocytes Relative 13 %   Monocytes Absolute 0.8 0.1 - 1.0 K/uL   Eosinophils Relative 2 %   Eosinophils Absolute 0.1 0.0 - 0.7 K/uL   Basophils Relative 0 %   Basophils Absolute 0.0 0.0 - 0.1 K/uL  Basic metabolic panel     Status: Abnormal   Collection Time: 04/08/16  9:20 PM  Result Value Ref Range   Sodium 138 135 - 145 mmol/L   Potassium 3.5 3.5 - 5.1 mmol/L   Chloride 103 101 - 111 mmol/L  CO2 29 22 - 32 mmol/L   Glucose, Bld 109 (H) 65 - 99 mg/dL   BUN 13 6 - 20 mg/dL   Creatinine, Ser 1.40 (H) 0.61 - 1.24 mg/dL   Calcium 9.0 8.9 - 10.3 mg/dL   GFR calc non Af Amer 49 (L) >60 mL/min   GFR calc Af Amer 57 (L) >60 mL/min    Comment: (NOTE) The eGFR has been calculated using the CKD EPI equation. This calculation has not been validated in all clinical situations. eGFR's persistently <60 mL/min signify possible Chronic Kidney Disease.    Anion gap 6 5 - 15  POCT urinalysis dipstick     Status: None   Collection Time: 04/21/16 11:48 AM  Result Value Ref Range   Color, UA yellow    Clarity, UA clear    Glucose, UA neg    Bilirubin, UA neg    Ketones, UA neg    Spec Grav, UA 1.015    Blood, UA neg    pH, UA 6.0    Protein, UA neg    Urobilinogen, UA 0.2    Nitrite, UA neg    Leukocytes, UA Negative Negative  CULTURE, URINE COMPREHENSIVE     Status: None   Collection Time: 04/21/16  2:10 PM  Result Value Ref Range   Colony Count NO GROWTH    Organism ID, Bacteria NO GROWTH   CBC     Status: None   Collection Time: 05/07/16  4:01 PM  Result Value Ref Range   WBC 4.2 3.8 - 10.8 K/uL   RBC 4.99 4.20 - 5.80 MIL/uL   Hemoglobin 14.4 13.2 - 17.1 g/dL   HCT 43.8 38.5 - 50.0 %   MCV 87.8 80.0 - 100.0 fL   MCH 28.9 27.0 - 33.0 pg   MCHC 32.9 32.0 - 36.0 g/dL   RDW 13.7 11.0 - 15.0 %   Platelets 182 140 - 400 K/uL   MPV 11.2 7.5 - 12.5 fL    Comment: ** Please note change in unit of measure and reference range(s). **    Comp Met (CMET)     Status: Abnormal   Collection Time: 05/07/16  4:01 PM  Result Value Ref Range   Sodium 137 135 - 146 mmol/L   Potassium 4.2 3.5 - 5.3 mmol/L   Chloride 102 98 - 110 mmol/L   CO2 30 20 - 31 mmol/L   Glucose, Bld 93 65 - 99 mg/dL   BUN 14 7 - 25 mg/dL   Creat 1.22 (H) 0.70 - 1.18 mg/dL    Comment:   For patients > or = 71 years of age: The upper reference limit for Creatinine is approximately 13% higher for people identified as African-American.      Total Bilirubin 0.8 0.2 - 1.2 mg/dL   Alkaline Phosphatase 64 40 - 115 U/L   AST 52 (H) 10 - 35 U/L   ALT 32 9 - 46 U/L   Total Protein 6.6 6.1 - 8.1 g/dL   Albumin 3.8 3.6 - 5.1 g/dL   Calcium 9.2 8.6 - 10.3 mg/dL  Urinalysis, Routine w reflex microscopic (not at [facility])     Status: None   Collection Time: 05/10/16  1:28 PM  Result Value Ref Range   Color, Urine YELLOW Yellow;Lt. Yellow   APPearance CLEAR Clear   Specific Gravity, Urine 1.015 1.000 - 1.030   pH 5.5 5.0 - 8.0   Total Protein, Urine NEGATIVE Negative   Urine Glucose NEGATIVE  Negative   Ketones, ur NEGATIVE Negative   Bilirubin Urine NEGATIVE Negative   Hgb urine dipstick NEGATIVE Negative   Urobilinogen, UA 0.2 0.0 - 1.0   Leukocytes, UA NEGATIVE Negative   Nitrite NEGATIVE Negative   WBC, UA 0-2/hpf 0-2/hpf   RBC / HPF none seen 0-2/hpf   Squamous Epithelial / LPF Rare(0-4/hpf) Rare(0-4/hpf)  Testosterone     Status: None   Collection Time: 05/26/16  9:44 AM  Result Value Ref Range   Testosterone 407.36 300.00 - 890.00 ng/dL  LH     Status: None   Collection Time: 05/26/16  9:44 AM  Result Value Ref Range   LH 4.22 3.10 - 34.60 mIU/mL    Comment: Male Reference Range:20-70 yrs     1.5-9.3 mIU/mL>70 yrs       3.1-35.6 mIU/mLFemale Reference Range:Follicular Phase     3.2-20.2 mIU/mLMidcycle             8.7-76.3 mIU/mLLuteal Phase         0.5-16.9 mIU/mL  Post Menopausal      15.9-54.0  mIU/mLPregnant             <1.5 mIU/mLContraceptives        0.7-5.6 mIU/mL   Estradiol     Status: None   Collection Time: 05/26/16  9:44 AM  Result Value Ref Range   Estradiol 33 <=39 pg/mL    Comment: Reference range established on post-pubertal patient population. No pre-pubertal reference range established using this assay. For any patients for whom low estradiol levels are anticipated (e.g. males, pre-pubertal children, and hypogonadal/post-menopausal females), the [employer] Estradiol, Ultrasensitive, LCMSMS assay is recommended. (Order code [id number]).     Prolactin     Status: None   Collection Time: 05/26/16  9:44 AM  Result Value Ref Range   Prolactin 5.7 2.0 - 18.0 ng/mL  CBG monitoring, ED     Status: Abnormal   Collection Time: 06/20/16 10:03 AM  Result Value Ref Range   Glucose-Capillary 184 (H) 65 - 99 mg/dL  Basic metabolic panel     Status: Abnormal   Collection Time: 06/20/16 10:28 AM  Result Value Ref Range   Sodium 133 (L) 135 - 145 mmol/L   Potassium 3.0 (L) 3.5 - 5.1 mmol/L   Chloride 100 (L) 101 - 111 mmol/L   CO2 25 22 - 32 mmol/L   Glucose, Bld 126 (H) 65 - 99 mg/dL   BUN 14 6 - 20 mg/dL   Creatinine, Ser 1.21 0.61 - 1.24 mg/dL   Calcium 8.7 (L) 8.9 - 10.3 mg/dL   GFR calc non Af Amer 58 (L) >60 mL/min   GFR calc Af Amer >60 >60 mL/min    Comment: (NOTE) The eGFR has been calculated using the CKD EPI equation. This calculation has not been validated in all clinical situations. eGFR's persistently <60 mL/min signify possible Chronic Kidney Disease.    Anion gap 8 5 - 15  CBC     Status: None   Collection Time: 06/20/16 10:28 AM  Result Value Ref Range   WBC 5.2 4.0 - 10.5 K/uL   RBC 5.66 4.22 - 5.81 MIL/uL   Hemoglobin 16.2 13.0 - 17.0 g/dL   HCT 49.7 39.0 - 52.0 %   MCV 87.8 78.0 - 100.0 fL   MCH 28.6 26.0 - 34.0 pg   MCHC 32.6 30.0 - 36.0 g/dL   RDW 13.8 11.5 - 15.5 %   Platelets 171 150 - 400 K/uL  I-stat troponin, ED     Status: None   Collection Time: 06/20/16 10:37  AM  Result Value Ref Range   Troponin i, poc 0.02 0.00 - 0.08 ng/mL   Comment 3            Comment: Due to the release kinetics of cTnI, a negative result within the first hours of the onset of symptoms does not rule out myocardial infarction with certainty. If myocardial infarction is still suspected, repeat the test at appropriate intervals.   Urinalysis, Routine w reflex microscopic     Status: None   Collection Time: 06/20/16 12:15 PM  Result Value Ref Range   Color, Urine YELLOW YELLOW   APPearance CLEAR CLEAR   Specific Gravity, Urine 1.014 1.005 - 1.030   pH 5.5 5.0 - 8.0   Glucose, UA NEGATIVE NEGATIVE mg/dL   Hgb urine dipstick NEGATIVE NEGATIVE   Bilirubin Urine NEGATIVE NEGATIVE   Ketones, ur NEGATIVE NEGATIVE mg/dL   Protein, ur NEGATIVE NEGATIVE mg/dL   Nitrite NEGATIVE NEGATIVE   Leukocytes, UA NEGATIVE NEGATIVE    Comment: MICROSCOPIC NOT DONE ON URINES WITH NEGATIVE PROTEIN, BLOOD, LEUKOCYTES, NITRITE, OR GLUCOSE <1000 mg/dL.  Basic metabolic panel     Status: Abnormal   Collection Time: 06/21/16  3:17 AM  Result Value Ref Range   Sodium 139 135 - 145 mmol/L   Potassium 3.3 (L) 3.5 - 5.1 mmol/L   Chloride 104 101 - 111 mmol/L   CO2 29 22 - 32 mmol/L   Glucose, Bld 109 (H) 65 - 99 mg/dL   BUN 12 6 - 20 mg/dL   Creatinine, Ser 1.18 0.61 - 1.24 mg/dL   Calcium 8.5 (L) 8.9 - 10.3 mg/dL   GFR calc non Af Amer >60 >60 mL/min   GFR calc Af Amer >60 >60 mL/min    Comment: (NOTE) The eGFR has been calculated using the CKD EPI equation. This calculation has not been validated in all clinical situations. eGFR's persistently <60 mL/min signify possible Chronic Kidney Disease.    Anion gap 6 5 - 15  CBC     Status: Abnormal   Collection Time: 06/21/16  3:17 AM  Result Value Ref Range   WBC 5.0 4.0 - 10.5 K/uL   RBC 5.17 4.22 - 5.81 MIL/uL   Hemoglobin 14.6 13.0 - 17.0 g/dL   HCT 45.6 39.0 - 52.0 %   MCV 88.2 78.0 - 100.0 fL   MCH 28.2 26.0 - 34.0 pg   MCHC  32.0 30.0 - 36.0 g/dL   RDW 13.8 11.5 - 15.5 %   Platelets 148 (L) 150 - 400 K/uL  VAS US CAROTID     Status: None   Collection Time: 06/21/16 11:39 AM  Result Value Ref Range   Right CCA prox sys 91 cm/s   Right CCA prox dias 14 cm/s   Right cca dist sys -47 cm/s   RIGHT ECA DIAS -10.00 cm/s   RIGHT VERTEBRAL DIAS 12.00 cm/s  Echocardiogram     Status: None   Collection Time: 06/21/16 12:05 PM  Result Value Ref Range   Weight 3,132.8 oz   Height 68 in   BP 126/76 mmHg    Assessment/Plan: 1. Hyponatremia Resolved by time of discharge. Patient is consistent with medications, diet and hydration. Will repeat CMP today. - Comp Met (CMET)  2. Hypokalemia Secondary to noncompliance with potassium supplementation while on thiazide. He is taking both as directed now. Will repeat CMP and titrate K supplement  dose accordingly. - Comp Met (CMET)  3. Syncope and collapse Suspect secondary to Tizanidine use as dehydration. Workup unremarkable. Tizanidine has been stopped. No residual symptoms. Exam today without abnormal findings. He is to continue current medications. Will repeat labs today. FU Scheduled. Alarm signs/symptoms reviewed with patient that would prompt ER assessment.   Leeanne Rio, PA-C

## 2016-07-07 DIAGNOSIS — Z6832 Body mass index (BMI) 32.0-32.9, adult: Secondary | ICD-10-CM | POA: Diagnosis not present

## 2016-07-07 DIAGNOSIS — M5416 Radiculopathy, lumbar region: Secondary | ICD-10-CM | POA: Diagnosis not present

## 2016-07-07 DIAGNOSIS — I10 Essential (primary) hypertension: Secondary | ICD-10-CM | POA: Diagnosis not present

## 2016-07-29 ENCOUNTER — Other Ambulatory Visit: Payer: Self-pay | Admitting: Physician Assistant

## 2016-07-30 NOTE — Telephone Encounter (Signed)
Rx request to pharmacy/[facility]  

## 2016-08-03 DIAGNOSIS — M5416 Radiculopathy, lumbar region: Secondary | ICD-10-CM | POA: Diagnosis not present

## 2016-08-24 DIAGNOSIS — M5416 Radiculopathy, lumbar region: Secondary | ICD-10-CM | POA: Diagnosis not present

## 2016-09-13 ENCOUNTER — Ambulatory Visit (INDEPENDENT_AMBULATORY_CARE_PROVIDER_SITE_OTHER): Payer: Medicare Other | Admitting: Physician Assistant

## 2016-09-13 ENCOUNTER — Encounter: Payer: Self-pay | Admitting: Physician Assistant

## 2016-09-13 VITALS — BP 110/82 | HR 128 | Temp 97.9°F | Resp 16 | Ht 69.0 in | Wt 198.5 lb

## 2016-09-13 DIAGNOSIS — R000 Tachycardia, unspecified: Secondary | ICD-10-CM | POA: Diagnosis not present

## 2016-09-13 DIAGNOSIS — M25551 Pain in right hip: Secondary | ICD-10-CM | POA: Diagnosis not present

## 2016-09-13 DIAGNOSIS — M25552 Pain in left hip: Secondary | ICD-10-CM | POA: Diagnosis not present

## 2016-09-13 DIAGNOSIS — M5090 Cervical disc disorder, unspecified, unspecified cervical region: Secondary | ICD-10-CM

## 2016-09-13 MED ORDER — PREGABALIN 50 MG PO CAPS: 50.0000 mg | ORAL_CAPSULE | Freq: Two times a day (BID) | ORAL | 1 refills | Status: DC

## 2016-09-13 NOTE — Patient Instructions (Signed)
We will be getting your records from Neurology so we can help your further with chronic symptoms. Please start the Lyrica as directed. Follow-up in 2 weeks.  You have a new onset atrial fibrillation. I have spoken with your Cardiologist Nasher who is going to be seeing you tomorrow morning at [id number] -- please be there 20 minute early for appointment.  They are recommending no changes to medication until you are seen in office tomorrow due to your complicated history. Continue current regimen.   If you note any chest pain or SOB, lightheadedness or dizziness, call 911.

## 2016-09-13 NOTE — Progress Notes (Signed)
Patient presents to clinic today to discuss multiple complaints.   Patient with history of chronic hip pain, bilateral. Has had prior workup including x-ray hips/lumbar spine and MRI lumbar spine. Patient did not respond to conservative measures so was set up with Neurosurgery. Endorses having 2 injections into the hip without improvement of pain. Endorses pain is still described as a pressure in the hip with tightness and sometimes numbness. Notes symptoms only occur when he is constipation. Once bowels are alleviated, symptoms resolve. Patient prescribed Gabapentin but has not been taking as he feels was subtherapeutic. Also notes pain medication makes symptoms worse. .   Patient also with cervical herniated disc and bone spurring for which he is also followed by Neurosurgery. Endorses intermittent pain occurring if he lifts arms above head for a period of time. Notes associated weakness. Endorses tightness in neck and chest with these motions as well. Denies chest pain or SOB. Has stopped Gabapentin. Is not taking anything for symptoms. Denies numbness or tingling of extremities.   Patient feels that initial suggestions by specialist have not been helpful. He has no scheduled follow-up with providers. On questioning he notes he has not made specialists aware that symptoms are not improved with current treatment.   Past Medical History:  Diagnosis Date  . Colon polyps   . History of chicken pox   . Hyperlipidemia   . Hypertension   . Internal hemorrhoids   . Sciatica   . Stroke [facility]) 2008    Current Outpatient Prescriptions on File Prior to Visit  Medication Sig Dispense Refill  . aspirin 81 MG tablet Take 81 mg by mouth daily.    . chlorthalidone (HYGROTON) 25 MG tablet TAKE ONE TABLET BY MOUTH ONCE DAILY 30 tablet 1  . doxazosin (CARDURA) 2 MG tablet Take 2 mg by mouth at bedtime as needed.  1  . gabapentin (NEURONTIN) 100 MG capsule Take 1 capsule (100 mg total) by mouth 3 (three)  times daily. 30 capsule 3  . Multiple Vitamins-Minerals (MENS MULTIVITAMIN PLUS) TABS Take by mouth daily. Reported on 04/30/2016    . potassium chloride SA (K-DUR,KLOR-CON) 20 MEQ tablet Take 1 tablet (20 mEq total) by mouth daily. (Patient taking differently: Take 20 mEq by mouth daily as needed (for cramping). ) 30 tablet 1  . traZODone (DESYREL) 50 MG tablet Take 0.5 tablets (25 mg total) by mouth at bedtime as needed for sleep. (Patient not taking: Reported on 09/13/2016) 30 tablet 0   No current facility-administered medications on file prior to visit.     Allergies  Allergen Reactions  . Crestor [Rosuvastatin Calcium] Shortness Of Breath    Muscle cramps  . Spironolactone Shortness Of Breath  . Terazosin Shortness Of Breath, Palpitations and Other (See Comments)    Nerve pain  . Atenolol Other (See Comments)    Drowsiness and "flu sxs"  . Bystolic [Nebivolol Hcl] Other (See Comments)    GI issues  . Hydralazine Other (See Comments)    Joint swelling  . Tizanidine Other (See Comments)    Syncope, Elevated BP/pulse  . Amlodipine Other (See Comments)  . Clonidine Other (See Comments)  . Hctz [Hydrochlorothiazide] Other (See Comments)    Other reaction(s): Unknown  . Hydrocodone-Acetaminophen Other (See Comments)  . Levofloxacin Other (See Comments)  . Losartan Other (See Comments)  . Maxzide [Hydrochlorothiazide W-Triamterene] Other (See Comments)  . Olmesartan Other (See Comments)  . Tussionex Pennkinetic Er [Hydrocod Polst-Cpm Polst Er] Other (See Comments)  Other reaction(s): Unknown    Family History  Problem Relation Age of Onset  . Hypertension Father 34    Deceased  . Heart disease Father   . Lung cancer Mother 36    Deceased  . Healthy Sister     x3  . Healthy Brother     x4  . Heart disease Brother     #5  . Other Daughter     Alpha Thalassemia  . Lupus Daughter     #2-deceased  . Diabetes Neg Hx   . Heart attack Neg Hx   . Hyperlipidemia Neg Hx     . Sudden death Neg Hx     Social History   Social History  . Marital status: Married    Spouse name: N/A  . Number of children: 1  . Years of education: 13   Occupational History  . Retired    Social History Main Topics  . Smoking status: Never Smoker  . Smokeless tobacco: Never Used  . Alcohol use No  . Drug use: No  . Sexual activity: Not Asked   Other Topics Concern  . None   Social History Narrative   Fun: Play golf and build furniture.     Review of Systems - See HPI.  All other ROS are negative.  BP 110/82 (BP Location: Right Arm, Patient Position: Sitting, Cuff Size: Large)   Pulse (!) 128   Temp 97.9 F (36.6 C) (Oral)   Resp 16   Ht _0  (1.753 m)   Wt 198 lb 8 oz (90 kg)   SpO2 98%   BMI 29.31 kg/m   Physical Exam  Constitutional: He is oriented to person, place, and time and well-developed, well-nourished, and in no distress.  HENT:  Head: Normocephalic and atraumatic.  Eyes: Conjunctivae are normal. Pupils are equal, round, and reactive to light.  Neck: Normal range of motion and full passive range of motion without pain. Neck supple.  Cardiovascular: Normal heart sounds and intact distal pulses.  Tachycardia present.   Pulses:      Radial pulses are 2+ on the right side, and 2+ on the left side.  Pulmonary/Chest: Effort normal and breath sounds normal. No respiratory distress. He has no wheezes. He has no rales. He exhibits no tenderness.  Neurological: He is alert and oriented to person, place, and time. He has normal strength and intact cranial nerves.    Recent Results (from the past 2160 hour(s))  CBG monitoring, ED     Status: Abnormal   Collection Time: 06/20/16 10:03 AM  Result Value Ref Range   Glucose-Capillary 184 (H) 65 - 99 mg/dL  Basic metabolic panel     Status: Abnormal   Collection Time: 06/20/16 10:28 AM  Result Value Ref Range   Sodium 133 (L) 135 - 145 mmol/L   Potassium 3.0 (L) 3.5 - 5.1 mmol/L   Chloride 100 (L) 101  - 111 mmol/L   CO2 25 22 - 32 mmol/L   Glucose, Bld 126 (H) 65 - 99 mg/dL   BUN 14 6 - 20 mg/dL   Creatinine, Ser 1.21 0.61 - 1.24 mg/dL   Calcium 8.7 (L) 8.9 - 10.3 mg/dL   GFR calc non Af Amer 58 (L) >60 mL/min   GFR calc Af Amer >60 >60 mL/min    Comment: (NOTE) The eGFR has been calculated using the CKD EPI equation. This calculation has not been validated in all clinical situations. eGFR's persistently <60 mL/min signify possible  Chronic Kidney Disease.    Anion gap 8 5 - 15  CBC     Status: None   Collection Time: 06/20/16 10:28 AM  Result Value Ref Range   WBC 5.2 4.0 - 10.5 K/uL   RBC 5.66 4.22 - 5.81 MIL/uL   Hemoglobin 16.2 13.0 - 17.0 g/dL   HCT 49.7 39.0 - 52.0 %   MCV 87.8 78.0 - 100.0 fL   MCH 28.6 26.0 - 34.0 pg   MCHC 32.6 30.0 - 36.0 g/dL   RDW 13.8 11.5 - 15.5 %   Platelets 171 150 - 400 K/uL  I-stat troponin, ED     Status: None   Collection Time: 06/20/16 10:37 AM  Result Value Ref Range   Troponin i, poc 0.02 0.00 - 0.08 ng/mL   Comment 3            Comment: Due to the release kinetics of cTnI, a negative result within the first hours of the onset of symptoms does not rule out myocardial infarction with certainty. If myocardial infarction is still suspected, repeat the test at appropriate intervals.   Urinalysis, Routine w reflex microscopic     Status: None   Collection Time: 06/20/16 12:15 PM  Result Value Ref Range   Color, Urine YELLOW YELLOW   APPearance CLEAR CLEAR   Specific Gravity, Urine 1.014 1.005 - 1.030   pH 5.5 5.0 - 8.0   Glucose, UA NEGATIVE NEGATIVE mg/dL   Hgb urine dipstick NEGATIVE NEGATIVE   Bilirubin Urine NEGATIVE NEGATIVE   Ketones, ur NEGATIVE NEGATIVE mg/dL   Protein, ur NEGATIVE NEGATIVE mg/dL   Nitrite NEGATIVE NEGATIVE   Leukocytes, UA NEGATIVE NEGATIVE    Comment: MICROSCOPIC NOT DONE ON URINES WITH NEGATIVE PROTEIN, BLOOD, LEUKOCYTES, NITRITE, OR GLUCOSE <1000 mg/dL.  Basic metabolic panel     Status: Abnormal    Collection Time: 06/21/16  3:17 AM  Result Value Ref Range   Sodium 139 135 - 145 mmol/L   Potassium 3.3 (L) 3.5 - 5.1 mmol/L   Chloride 104 101 - 111 mmol/L   CO2 29 22 - 32 mmol/L   Glucose, Bld 109 (H) 65 - 99 mg/dL   BUN 12 6 - 20 mg/dL   Creatinine, Ser 1.18 0.61 - 1.24 mg/dL   Calcium 8.5 (L) 8.9 - 10.3 mg/dL   GFR calc non Af Amer >60 >60 mL/min   GFR calc Af Amer >60 >60 mL/min    Comment: (NOTE) The eGFR has been calculated using the CKD EPI equation. This calculation has not been validated in all clinical situations. eGFR's persistently <60 mL/min signify possible Chronic Kidney Disease.    Anion gap 6 5 - 15  CBC     Status: Abnormal   Collection Time: 06/21/16  3:17 AM  Result Value Ref Range   WBC 5.0 4.0 - 10.5 K/uL   RBC 5.17 4.22 - 5.81 MIL/uL   Hemoglobin 14.6 13.0 - 17.0 g/dL   HCT 45.6 39.0 - 52.0 %   MCV 88.2 78.0 - 100.0 fL   MCH 28.2 26.0 - 34.0 pg   MCHC 32.0 30.0 - 36.0 g/dL   RDW 13.8 11.5 - 15.5 %   Platelets 148 (L) 150 - 400 K/uL  VAS US CAROTID     Status: None   Collection Time: 06/21/16 11:39 AM  Result Value Ref Range   Right CCA prox sys 91 cm/s   Right CCA prox dias 14 cm/s   Right cca dist sys -  47 cm/s   RIGHT ECA DIAS -10.00 cm/s   RIGHT VERTEBRAL DIAS 12.00 cm/s  Echocardiogram     Status: None   Collection Time: 06/21/16 12:05 PM  Result Value Ref Range   Weight 3,132.8 oz   Height 68 in   BP 126/76 mmHg  Comp Met (CMET)     Status: None   Collection Time: 06/28/16 12:37 PM  Result Value Ref Range   Sodium 137 135 - 145 mEq/L   Potassium 3.9 3.5 - 5.1 mEq/L   Chloride 100 96 - 112 mEq/L   CO2 29 19 - 32 mEq/L   Glucose, Bld 85 70 - 99 mg/dL   BUN 14 6 - 23 mg/dL   Creatinine, Ser 1.20 0.40 - 1.50 mg/dL   Total Bilirubin 0.8 0.2 - 1.2 mg/dL   Alkaline Phosphatase 70 39 - 117 U/L   AST 26 0 - 37 U/L   ALT 25 0 - 53 U/L   Total Protein 7.7 6.0 - 8.3 g/dL   Albumin 4.1 3.5 - 5.2 g/dL   Calcium 9.7 8.4 - 10.5 mg/dL    GFR 76.67 >60.00 mL/min   Assessment/Plan: 1. Tachycardia Noted on examination. Pulse is at 126 which is almost double his normal baseline HR. Denies palpitations, lightheadedness, dizziness chest pain or SOB. EKG obtained reading atrial fibrillation but seems equal spacing between QRS complexes. Cardiology doc-of-day consulted, Dr. Angelena Form who confirms atrial fibrillation without RVR. Consulted patient's primary Cardiologist, Dr. Cathie Olden who recommends holding change in treatment at present due to patient's significant history of medication intolerance, including BB and CCB. Has scheduled appt for patient first thing tomorrow morning to discuss treatment, since patient asymptomatic. Alarm signs/symptoms discussed with patient that would prompt 911 call. Patient voices understanding and agreement with plan. States he will not take any new mediations as he feels he does not need them.  Is to continue ASA. - EKG 12-Lead  2. Cervical neck pain with evidence of disc disease Followed by Neurosurgery. Discussed with patient that I will need records to be able to help determine what all has been done thus far and where to go from her. Discussed it would be wise to inform Neurosurgeon that measures so far have been ineffective. They cannot help him if he is not informing them treatment is not working. Will start Lyrica.  3. Bilateral hip pain 2/2 disc herniation and foraminal stenosis in lumbar spine. Also arthritis of hips bilaterally. Followed by specialist. Injections subtherapeutic. Has not scheduled follow-up. Will obtain records. Will start Lyrica. Patient refuses "pain medication" as he states they make his pain worse.   Leeanne Rio, PA-C

## 2016-09-14 ENCOUNTER — Encounter: Payer: Self-pay | Admitting: Cardiovascular Disease

## 2016-09-14 ENCOUNTER — Ambulatory Visit (INDEPENDENT_AMBULATORY_CARE_PROVIDER_SITE_OTHER): Payer: Medicare Other | Admitting: Cardiovascular Disease

## 2016-09-14 ENCOUNTER — Telehealth: Payer: Self-pay | Admitting: Physician Assistant

## 2016-09-14 VITALS — BP 130/80 | HR 120 | Ht 68.0 in | Wt 199.4 lb

## 2016-09-14 DIAGNOSIS — I10 Essential (primary) hypertension: Secondary | ICD-10-CM

## 2016-09-14 DIAGNOSIS — I481 Persistent atrial fibrillation: Secondary | ICD-10-CM

## 2016-09-14 DIAGNOSIS — I4819 Other persistent atrial fibrillation: Secondary | ICD-10-CM | POA: Insufficient documentation

## 2016-09-14 LAB — CBC WITH DIFFERENTIAL/PLATELET
BASOS ABS: 0 {cells}/uL (ref 0–200)
Basophils Relative: 0 %
EOS ABS: 54 {cells}/uL (ref 15–500)
Eosinophils Relative: 1 %
HEMATOCRIT: 49.4 % (ref 38.5–50.0)
HEMOGLOBIN: 16.6 g/dL (ref 13.2–17.1)
LYMPHS ABS: 1728 {cells}/uL (ref 850–3900)
Lymphocytes Relative: 32 %
MCH: 29.1 pg (ref 27.0–33.0)
MCHC: 33.6 g/dL (ref 32.0–36.0)
MCV: 86.5 fL (ref 80.0–100.0)
MONO ABS: 648 {cells}/uL (ref 200–950)
MPV: 10.3 fL (ref 7.5–12.5)
Monocytes Relative: 12 %
NEUTROS ABS: 2970 {cells}/uL (ref 1500–7800)
NEUTROS PCT: 55 %
Platelets: 172 10*3/uL (ref 140–400)
RBC: 5.71 MIL/uL (ref 4.20–5.80)
RDW: 13.8 % (ref 11.0–15.0)
WBC: 5.4 10*3/uL (ref 3.8–10.8)

## 2016-09-14 LAB — COMPREHENSIVE METABOLIC PANEL
ALT: 31 U/L (ref 9–46)
AST: 28 U/L (ref 10–35)
Albumin: 3.8 g/dL (ref 3.6–5.1)
Alkaline Phosphatase: 64 U/L (ref 40–115)
BUN: 19 mg/dL (ref 7–25)
CALCIUM: 9.2 mg/dL (ref 8.6–10.3)
CHLORIDE: 101 mmol/L (ref 98–110)
CO2: 32 mmol/L — AB (ref 20–31)
Creat: 1.36 mg/dL — ABNORMAL HIGH (ref 0.70–1.18)
GLUCOSE: 90 mg/dL (ref 65–99)
POTASSIUM: 3.6 mmol/L (ref 3.5–5.3)
Sodium: 141 mmol/L (ref 135–146)
Total Bilirubin: 1.5 mg/dL — ABNORMAL HIGH (ref 0.2–1.2)
Total Protein: 6.5 g/dL (ref 6.1–8.1)

## 2016-09-14 LAB — TSH: TSH: 0.67 m[IU]/L (ref 0.40–4.50)

## 2016-09-14 MED ORDER — RIVAROXABAN 20 MG PO TABS: 20.0000 mg | ORAL_TABLET | Freq: Every day | ORAL | 11 refills | Status: DC

## 2016-09-14 MED ORDER — METOPROLOL TARTRATE 25 MG PO TABS: 25.0000 mg | ORAL_TABLET | Freq: Two times a day (BID) | ORAL | 11 refills | Status: DC

## 2016-09-14 NOTE — Progress Notes (Signed)
Cardiology Office Note   Date:  09/14/2016   ID:  Patrick Roth, DOB Apr 08, 1946, MRN [id number]  PCP:  Leeanne Rio, PA-C  Cardiologist:   Mertie Moores, MD   Chief Complaint  Patient presents with  . Atrial Fibrillation   Problem List 1. CVA - 2008  2. Dyspnea 3. Essential Hypertension 4. Hyperlipidemia     Patrick Roth is a 70 y.o. male who presents for some dyspnea  He has had HTN for years.  Recently has had some dyspnea - thinks its related to his BP meds.   Has tried multiple BP meds .  Intolerant to multiple meds.   Has seen Dr. Johnsie Cancel in the past.  Eats a low salt diet - for years.  Formerly worked as a Games developer .   Oct. 11, 2016:  Patrick Roth is seen for follow up  Still has significant HTN Is intolerent to many meds  , does not recall any specific allergies to specific meds.   10/13/2015: Patrick Roth is seen today for follow-up of his high blood pressure. He had a stress Myoview study. Myoview studies showed no ischemia. Study was not gated. Doing well on the Aldactone 100 mg tabs.  04/15/2016:  Patrick Roth continues to have problems with HTN Has multiple drug intolerances  Is having lots of hip pain  Has stopped the Doxazosin and the Terazosin himself. He cut his aldactone to 50 mg a day  Does complain of sore breasts  Oct. 24, 2017:  Patrick Roth is seen today as a work in visit. He was found have atrial fibrillation yesterday at his primary medical doctor's office. He has a history of hypertension.  Cannot feel any HR irregularly .   Has some shortness of breath  He relates this to having his internal hemorrhoids removed.     ECG yesterday shows atrial fib at 125.   He was admitted to the hospital in July for syncope.  His symptoms were consistent with orthostatic hypotension.  Has some burning in his neck - bilaterally. No CP  The neck pain is not worsened with exertion.   Past Medical History:  Diagnosis Date  . Colon polyps   . History  of chicken pox   . Hyperlipidemia   . Hypertension   . Internal hemorrhoids   . Sciatica   . Stroke [facility]) 2008    Past Surgical History:  Procedure Laterality Date  . COLONOSCOPY  2011  . great toe surgery    . PROSTATE BIOPSY       Current Outpatient Prescriptions  Medication Sig Dispense Refill  . aspirin 81 MG tablet Take 81 mg by mouth daily.    . chlorthalidone (HYGROTON) 25 MG tablet TAKE ONE TABLET BY MOUTH ONCE DAILY 30 tablet 1  . doxazosin (CARDURA) 2 MG tablet Take 2 mg by mouth at bedtime as needed.  1  . gabapentin (NEURONTIN) 100 MG capsule Take 1 capsule (100 mg total) by mouth 3 (three) times daily. 30 capsule 3  . Multiple Vitamins-Minerals (MENS MULTIVITAMIN PLUS) TABS Take 1 tablet by mouth daily. Reported on 04/30/2016    . potassium chloride SA (K-DUR,KLOR-CON) 20 MEQ tablet Take 20 mEq by mouth daily as needed (cramping).    . pregabalin (LYRICA) 50 MG capsule Take 1 capsule (50 mg total) by mouth 2 (two) times daily. 60 capsule 1   No current facility-administered medications for this visit.     Allergies:   Crestor [rosuvastatin calcium]; Spironolactone; Terazosin;  Atenolol; Bystolic [nebivolol hcl]; Hydralazine; Tizanidine; Amlodipine; Clonidine; Hctz [hydrochlorothiazide]; Hydrocodone-acetaminophen; Levofloxacin; Losartan; Maxzide [hydrochlorothiazide w-triamterene]; Olmesartan; and Tussionex pennkinetic er [hydrocod polst-cpm polst er]    Social History:  The patient  reports that he has never smoked. He has never used smokeless tobacco. He reports that he does not drink alcohol or use drugs.   Family History:  The patient's family history includes Healthy in his brother and sister; Heart disease in his brother and father; Hypertension (age of onset: 8) in his father; Lung cancer (age of onset: 14) in his mother; Lupus in his daughter; Other in his daughter.    ROS:  Please see the history of present illness.    Review of Systems: Constitutional:   denies fever, chills, diaphoresis, appetite change and fatigue.  HEENT: denies photophobia, eye pain, redness, hearing loss, ear pain, congestion, sore throat, rhinorrhea, sneezing, neck pain, neck stiffness and tinnitus.  Respiratory: denies SOB, DOE, cough, chest tightness, and wheezing.  Cardiovascular: denies chest pain, palpitations and leg swelling.  Gastrointestinal: denies nausea, vomiting, abdominal pain, diarrhea, constipation, blood in stool.  Genitourinary: denies dysuria, urgency, frequency, hematuria, flank pain and difficulty urinating.  Musculoskeletal: denies  myalgias, back pain, joint swelling, arthralgias and gait problem.   Skin: denies pallor, rash and wound.  Neurological: denies dizziness, seizures, syncope, weakness, light-headedness, numbness and headaches.   Hematological: denies adenopathy, easy bruising, personal or family bleeding history.  Psychiatric/ Behavioral: denies suicidal ideation, mood changes, confusion, nervousness, sleep disturbance and agitation.       All other systems are reviewed and negative.    PHYSICAL EXAM: VS:  BP 130/80   Pulse 62   Ht 5\' 8"  (1.727 m)   Wt 199 lb 6.4 oz (90.4 kg)   SpO2 97%   BMI 30.32 kg/m  , BMI Body mass index is 30.32 kg/m. GEN: Well nourished, well developed, in no acute distress  HEENT: normal  Neck: no JVD, carotid bruits, or masses Cardiac: RRR with occasional premature beats ; no murmurs, rubs, or gallops,no edema  Respiratory:  clear to auscultation bilaterally, normal work of breathing GI: soft, nontender, nondistended, + BS MS: no deformity or atrophy  Skin: warm and dry, no rash Neuro:  Strength and sensation are intact Psych: normal   EKG:  EKG is ordered yesterday  The ekg ordered 10/23 shows atrial fib at 125 .     Recent Labs: 01/29/2016: Pro B Natriuretic peptide (BNP) 152.0 06/21/2016: Hemoglobin 14.6; Platelets 148 06/28/2016: ALT 25; BUN 14; Creatinine, Ser 1.20; Potassium 3.9; Sodium  137    Lipid Panel    Component Value Date/Time   CHOL 209 (H) 02/13/2016 1113   TRIG 71.0 02/13/2016 1113   HDL 52.90 02/13/2016 1113   CHOLHDL 4 02/13/2016 1113   VLDL 14.2 02/13/2016 1113   LDLCALC 142 (H) 02/13/2016 1113      Wt Readings from Last 3 Encounters:  09/14/16 199 lb 6.4 oz (90.4 kg)  09/13/16 198 lb 8 oz (90 kg)  06/28/16 205 lb 4 oz (93.1 kg)      Other studies Reviewed: Additional studies/ records that were reviewed today include: . Review of the above records demonstrates:    ASSESSMENT AND PLAN:  1.  Hypertension:  He's been on Aldactone 100 mg a day .  He cut the dose to 50 on his own. He stopped the Hytrin and the Doxazosin on his own.  It has been very difficult to keep him on any standard medical regimen. He  tends to stop medications on his own.   2. Persistent atrial fibrillation: Patrick Roth has no symptoms of A. fib. He has lots of various concerns and issues with his bowels. He does not have any chest pain but does have some occasional shortness of breath. He describes some neck pain but it is not exertional.  His heart rate is fairly rapid. We'll start him on metoprolol 25 mg twice a day. We'll start him on Xarelto 20 mg once a day with his largest meal. We'll have him return to see a physician's assistant in 1 week to continue up titration of the metoprolol. We'll need to keep him on Xarelto 4 month prior to cardioversion.  We told him how important it is for him to take his medications. We discussed the implications of not taking the blood thinner including stroke.  We'll schedule an echocardiogram. We'll check CBC, basic medical profile, TSH and liver enzymes    Current medicines are reviewed at length with the patient today.  The patient does not have concerns regarding medicines.  The following changes have been made:  no change  Labs/ tests ordered today include:  No orders of the defined types were placed in this  encounter.   Disposition:    Mertie Moores, MD  09/14/2016 9:52 AM    [address], [place], [place]  [address] Phone: [phone]; Fax: [phone]   [facility]  [address] [place], [place]  [address] [phone]   Fax [phone]

## 2016-09-14 NOTE — Patient Instructions (Signed)
Medication Instructions:  START Xarelto 20 mg once daily START Metoprolol (Lopressor) 25 mg twice daily STOP Aspirin   Labwork: TODAY - TSH, CBC, complete metabolic panel   Testing/Procedures: Your physician has requested that you have an echocardiogram. Echocardiography is a painless test that uses sound waves to create images of your heart. It provides your doctor with information about the size and shape of your heart and how well your heart's chambers and valves are working. This procedure takes approximately one hour. There are no restrictions for this procedure.   Follow-Up: Your physician recommends that you schedule a follow-up appointment in: 1 week with APP with EKG   If you need a refill on your cardiac medications before your next appointment, please call your pharmacy.   Thank you for choosing [facility]! Christen Bame, RN [phone]

## 2016-09-14 NOTE — Telephone Encounter (Signed)
Caller name: Relationship to patient: Self Can be reached: [phone] Pharmacy:  Reason for call: FYI: Patient wanted to inform provider that he has seen the cardiologist and they are sending notes on the visit.

## 2016-09-14 NOTE — Telephone Encounter (Signed)
Noted! Thank you

## 2016-09-17 ENCOUNTER — Ambulatory Visit (INDEPENDENT_AMBULATORY_CARE_PROVIDER_SITE_OTHER): Payer: Medicare Other | Admitting: Physician Assistant

## 2016-09-17 ENCOUNTER — Encounter: Payer: Self-pay | Admitting: Physician Assistant

## 2016-09-17 VITALS — BP 116/78 | HR 127 | Temp 97.8°F | Resp 16 | Ht 68.0 in | Wt 203.2 lb

## 2016-09-17 DIAGNOSIS — I4819 Other persistent atrial fibrillation: Secondary | ICD-10-CM

## 2016-09-17 DIAGNOSIS — I10 Essential (primary) hypertension: Secondary | ICD-10-CM

## 2016-09-17 DIAGNOSIS — I481 Persistent atrial fibrillation: Secondary | ICD-10-CM | POA: Diagnosis not present

## 2016-09-17 NOTE — Patient Instructions (Addendum)
Please continue chronic medications as directed. You have only been on the Metoprolol for a couple of days. Will take longer to reach therapeutic effect. However, I want you to call your Cardiologist to see if they want you to go ahead and increase dosage.  Follow-up with them next week as directed. Your blood pressure looks fantastic today.

## 2016-09-17 NOTE — Progress Notes (Signed)
Pre visit review using our clinic review tool, if applicable. No additional management support is needed unless otherwise documented below in the visit note/SLS  

## 2016-09-17 NOTE — Progress Notes (Signed)
Patient presents to clinic today for 12-monthfollow-up of hypertension and hyperlipidemia. Patient was seen in office on 09/13/16 to discuss other issues. HR noted to be at 120s. On exam a regularly irregular rhythm was noted. EKG obtained revealed Atrial flutter/fibrilaltion without RVR. Cardiology consulted and evaluated patient. Patient was started on Xarelto and Metoprolol tartrate 25 mg BID. Patient is currently on Chlorthalidone for hypertension. Endorses taking both medications as directed. Patient denies chest pain, palpitations, lightheadedness, dizziness, vision changes or frequent headaches.  BP Readings from Last 3 Encounters:  09/17/16 116/78  09/14/16 130/80  09/13/16 110/82    Past Medical History:  Diagnosis Date  . Colon polyps   . History of chicken pox   . Hyperlipidemia   . Hypertension   . Internal hemorrhoids   . Sciatica   . Stroke ([facility] 2008    Current Outpatient Prescriptions on File Prior to Visit  Medication Sig Dispense Refill  . chlorthalidone (HYGROTON) 25 MG tablet TAKE ONE TABLET BY MOUTH ONCE DAILY 30 tablet 1  . doxazosin (CARDURA) 2 MG tablet Take 2 mg by mouth at bedtime as needed.  1  . gabapentin (NEURONTIN) 100 MG capsule Take 1 capsule (100 mg total) by mouth 3 (three) times daily. 30 capsule 3  . metoprolol tartrate (LOPRESSOR) 25 MG tablet Take 1 tablet (25 mg total) by mouth 2 (two) times daily. 60 tablet 11  . Multiple Vitamins-Minerals (MENS MULTIVITAMIN PLUS) TABS Take 1 tablet by mouth daily. Reported on 04/30/2016    . potassium chloride SA (K-DUR,KLOR-CON) 20 MEQ tablet Take 20 mEq by mouth daily as needed (cramping).    . pregabalin (LYRICA) 50 MG capsule Take 1 capsule (50 mg total) by mouth 2 (two) times daily. 60 capsule 1  . rivaroxaban (XARELTO) 20 MG TABS tablet Take 1 tablet (20 mg total) by mouth daily with supper. 30 tablet 11   No current facility-administered medications on file prior to visit.     Allergies  Allergen  Reactions  . Crestor [Rosuvastatin Calcium] Shortness Of Breath    Muscle cramps  . Spironolactone Shortness Of Breath  . Terazosin Shortness Of Breath, Palpitations and Other (See Comments)    Nerve pain  . Atenolol Other (See Comments)    Drowsiness and "flu sxs"  . Bystolic [Nebivolol Hcl] Other (See Comments)    GI issues  . Hydralazine Other (See Comments)    Joint swelling  . Tizanidine Other (See Comments)    Syncope, Elevated BP/pulse  . Amlodipine Other (See Comments)  . Clonidine Other (See Comments)  . Hctz [Hydrochlorothiazide] Other (See Comments)    Other reaction(s): Unknown  . Hydrocodone-Acetaminophen Other (See Comments)  . Levofloxacin Other (See Comments)  . Losartan Other (See Comments)  . Maxzide [Hydrochlorothiazide W-Triamterene] Other (See Comments)  . Olmesartan Other (See Comments)  . Tussionex Pennkinetic Er [Hydrocod Polst-Cpm Polst Er] Other (See Comments)    Other reaction(s): Unknown    Family History  Problem Relation Age of Onset  . Hypertension Father 819   Deceased  . Heart disease Father   . Lung cancer Mother 831   Deceased  . Healthy Sister     x3  . Healthy Brother     x4  . Heart disease Brother     #5  . Other Daughter     Alpha Thalassemia  . Lupus Daughter     #2-deceased  . Diabetes Neg Hx   . Heart attack Neg Hx   .  Hyperlipidemia Neg Hx   . Sudden death Neg Hx     Social History   Social History  . Marital status: Married    Spouse name: N/A  . Number of children: 1  . Years of education: 28   Occupational History  . Retired    Social History Main Topics  . Smoking status: Never Smoker  . Smokeless tobacco: Never Used  . Alcohol use No  . Drug use: No  . Sexual activity: Not Asked   Other Topics Concern  . None   Social History Narrative   Fun: Play golf and build furniture.     Review of Systems - See HPI.  All other ROS are negative.  BP (!) 0/0 (BP Location: Left Arm, Patient Position:  Sitting, Cuff Size: Large)   Ht 5' 8"  (1.727 m)   Wt 203 lb 4 oz (92.2 kg)   BMI 30.90 kg/m   Physical Exam  Constitutional: He is oriented to person, place, and time and well-developed, well-nourished, and in no distress.  HENT:  Head: Normocephalic and atraumatic.  Eyes: Conjunctivae are normal.  Neck: Neck supple.  Cardiovascular: A regularly irregular rhythm present. Tachycardia present.   Pulmonary/Chest: Breath sounds normal. No respiratory distress. He has no wheezes. He has no rales. He exhibits no tenderness.  Neurological: He is alert and oriented to person, place, and time.  Skin: Skin is warm and dry. No rash noted.  Psychiatric: Affect normal.  Vitals reviewed.   Recent Results (from the past 2160 hour(s))  CBG monitoring, ED     Status: Abnormal   Collection Time: 06/20/16 10:03 AM  Result Value Ref Range   Glucose-Capillary 184 (H) 65 - 99 mg/dL  Basic metabolic panel     Status: Abnormal   Collection Time: 06/20/16 10:28 AM  Result Value Ref Range   Sodium 133 (L) 135 - 145 mmol/L   Potassium 3.0 (L) 3.5 - 5.1 mmol/L   Chloride 100 (L) 101 - 111 mmol/L   CO2 25 22 - 32 mmol/L   Glucose, Bld 126 (H) 65 - 99 mg/dL   BUN 14 6 - 20 mg/dL   Creatinine, Ser 1.21 0.61 - 1.24 mg/dL   Calcium 8.7 (L) 8.9 - 10.3 mg/dL   GFR calc non Af Amer 58 (L) >60 mL/min   GFR calc Af Amer >60 >60 mL/min    Comment: (NOTE) The eGFR has been calculated using the CKD EPI equation. This calculation has not been validated in all clinical situations. eGFR's persistently <60 mL/min signify possible Chronic Kidney Disease.    Anion gap 8 5 - 15  CBC     Status: None   Collection Time: 06/20/16 10:28 AM  Result Value Ref Range   WBC 5.2 4.0 - 10.5 K/uL   RBC 5.66 4.22 - 5.81 MIL/uL   Hemoglobin 16.2 13.0 - 17.0 g/dL   HCT 49.7 39.0 - 52.0 %   MCV 87.8 78.0 - 100.0 fL   MCH 28.6 26.0 - 34.0 pg   MCHC 32.6 30.0 - 36.0 g/dL   RDW 13.8 11.5 - 15.5 %   Platelets 171 150 - 400 K/uL   I-stat troponin, ED     Status: None   Collection Time: 06/20/16 10:37 AM  Result Value Ref Range   Troponin i, poc 0.02 0.00 - 0.08 ng/mL   Comment 3            Comment: Due to the release kinetics of cTnI, a negative  result within the first hours of the onset of symptoms does not rule out myocardial infarction with certainty. If myocardial infarction is still suspected, repeat the test at appropriate intervals.   Urinalysis, Routine w reflex microscopic     Status: None   Collection Time: 06/20/16 12:15 PM  Result Value Ref Range   Color, Urine YELLOW YELLOW   APPearance CLEAR CLEAR   Specific Gravity, Urine 1.014 1.005 - 1.030   pH 5.5 5.0 - 8.0   Glucose, UA NEGATIVE NEGATIVE mg/dL   Hgb urine dipstick NEGATIVE NEGATIVE   Bilirubin Urine NEGATIVE NEGATIVE   Ketones, ur NEGATIVE NEGATIVE mg/dL   Protein, ur NEGATIVE NEGATIVE mg/dL   Nitrite NEGATIVE NEGATIVE   Leukocytes, UA NEGATIVE NEGATIVE    Comment: MICROSCOPIC NOT DONE ON URINES WITH NEGATIVE PROTEIN, BLOOD, LEUKOCYTES, NITRITE, OR GLUCOSE <1000 mg/dL.  Basic metabolic panel     Status: Abnormal   Collection Time: 06/21/16  3:17 AM  Result Value Ref Range   Sodium 139 135 - 145 mmol/L   Potassium 3.3 (L) 3.5 - 5.1 mmol/L   Chloride 104 101 - 111 mmol/L   CO2 29 22 - 32 mmol/L   Glucose, Bld 109 (H) 65 - 99 mg/dL   BUN 12 6 - 20 mg/dL   Creatinine, Ser 1.18 0.61 - 1.24 mg/dL   Calcium 8.5 (L) 8.9 - 10.3 mg/dL   GFR calc non Af Amer >60 >60 mL/min   GFR calc Af Amer >60 >60 mL/min    Comment: (NOTE) The eGFR has been calculated using the CKD EPI equation. This calculation has not been validated in all clinical situations. eGFR's persistently <60 mL/min signify possible Chronic Kidney Disease.    Anion gap 6 5 - 15  CBC     Status: Abnormal   Collection Time: 06/21/16  3:17 AM  Result Value Ref Range   WBC 5.0 4.0 - 10.5 K/uL   RBC 5.17 4.22 - 5.81 MIL/uL   Hemoglobin 14.6 13.0 - 17.0 g/dL   HCT 45.6 39.0  - 52.0 %   MCV 88.2 78.0 - 100.0 fL   MCH 28.2 26.0 - 34.0 pg   MCHC 32.0 30.0 - 36.0 g/dL   RDW 13.8 11.5 - 15.5 %   Platelets 148 (L) 150 - 400 K/uL  VAS US CAROTID     Status: None   Collection Time: 06/21/16 11:39 AM  Result Value Ref Range   Right CCA prox sys 91 cm/s   Right CCA prox dias 14 cm/s   Right cca dist sys -47 cm/s   RIGHT ECA DIAS -10.00 cm/s   RIGHT VERTEBRAL DIAS 12.00 cm/s  Echocardiogram     Status: None   Collection Time: 06/21/16 12:05 PM  Result Value Ref Range   Weight 3,132.8 oz   Height 68 in   BP 126/76 mmHg  Comp Met (CMET)     Status: None   Collection Time: 06/28/16 12:37 PM  Result Value Ref Range   Sodium 137 135 - 145 mEq/L   Potassium 3.9 3.5 - 5.1 mEq/L   Chloride 100 96 - 112 mEq/L   CO2 29 19 - 32 mEq/L   Glucose, Bld 85 70 - 99 mg/dL   BUN 14 6 - 23 mg/dL   Creatinine, Ser 1.20 0.40 - 1.50 mg/dL   Total Bilirubin 0.8 0.2 - 1.2 mg/dL   Alkaline Phosphatase 70 39 - 117 U/L   AST 26 0 - 37 U/L   ALT 25  0 - 53 U/L   Total Protein 7.7 6.0 - 8.3 g/dL   Albumin 4.1 3.5 - 5.2 g/dL   Calcium 9.7 8.4 - 10.5 mg/dL   GFR 76.67 >60.00 mL/min  TSH     Status: None   Collection Time: 09/14/16 10:19 AM  Result Value Ref Range   TSH 0.67 0.40 - 4.50 mIU/L  Comp Met (CMET)     Status: Abnormal   Collection Time: 09/14/16 10:19 AM  Result Value Ref Range   Sodium 141 135 - 146 mmol/L   Potassium 3.6 3.5 - 5.3 mmol/L   Chloride 101 98 - 110 mmol/L   CO2 32 (H) 20 - 31 mmol/L   Glucose, Bld 90 65 - 99 mg/dL   BUN 19 7 - 25 mg/dL   Creat 1.36 (H) 0.70 - 1.18 mg/dL    Comment:   For patients > or = 71 years of age: The upper reference limit for Creatinine is approximately 13% higher for people identified as African-American.      Total Bilirubin 1.5 (H) 0.2 - 1.2 mg/dL   Alkaline Phosphatase 64 40 - 115 U/L   AST 28 10 - 35 U/L   ALT 31 9 - 46 U/L   Total Protein 6.5 6.1 - 8.1 g/dL   Albumin 3.8 3.6 - 5.1 g/dL   Calcium 9.2 8.6 - 10.3  mg/dL  CBC w/Diff     Status: None   Collection Time: 09/14/16 10:19 AM  Result Value Ref Range   WBC 5.4 3.8 - 10.8 K/uL   RBC 5.71 4.20 - 5.80 MIL/uL   Hemoglobin 16.6 13.2 - 17.1 g/dL   HCT 49.4 38.5 - 50.0 %   MCV 86.5 80.0 - 100.0 fL   MCH 29.1 27.0 - 33.0 pg   MCHC 33.6 32.0 - 36.0 g/dL   RDW 13.8 11.0 - 15.0 %   Platelets 172 140 - 400 K/uL   MPV 10.3 7.5 - 12.5 fL   Neutro Abs 2,970 1,500 - 7,800 cells/uL   Lymphs Abs 1,728 850 - 3,900 cells/uL   Monocytes Absolute 648 200 - 950 cells/uL   Eosinophils Absolute 54 15 - 500 cells/uL   Basophils Absolute 0 0 - 200 cells/uL   Neutrophils Relative % 55 %   Lymphocytes Relative 32 %   Monocytes Relative 12 %   Eosinophils Relative 1 %   Basophils Relative 0 %   Smear Review Criteria for review not met     Assessment/Plan: 1. Essential hypertension BP normotensive. Patient asymptomatic. Continue current medication regimen.   2. Persistent atrial fibrillation ([place]) Followed by Cardiology. HR currently at 110 on recheck. Just started Metoprolol a couple of days ago. Discussed that medication can take a few days to reach its true effect. However, patient to contact Cardiology before scheduled FU next week to see if they would recommend increasing metoprolol dosage.   Leeanne Rio, PA-C

## 2016-09-20 ENCOUNTER — Ambulatory Visit (INDEPENDENT_AMBULATORY_CARE_PROVIDER_SITE_OTHER): Payer: Medicare Other | Admitting: Physician Assistant

## 2016-09-20 ENCOUNTER — Encounter: Payer: Self-pay | Admitting: Physician Assistant

## 2016-09-20 VITALS — BP 130/60 | HR 141 | Ht 68.0 in | Wt 204.8 lb

## 2016-09-20 DIAGNOSIS — I481 Persistent atrial fibrillation: Secondary | ICD-10-CM | POA: Diagnosis not present

## 2016-09-20 DIAGNOSIS — I10 Essential (primary) hypertension: Secondary | ICD-10-CM

## 2016-09-20 DIAGNOSIS — I4819 Other persistent atrial fibrillation: Secondary | ICD-10-CM

## 2016-09-20 MED ORDER — METOPROLOL TARTRATE 50 MG PO TABS: 50.0000 mg | ORAL_TABLET | Freq: Two times a day (BID) | ORAL | 3 refills | Status: DC

## 2016-09-20 NOTE — Progress Notes (Signed)
Cardiology Office Note:    Date:  09/20/2016   ID:  Patrick Roth, DOB [birth year], MRN [id number]  PCP:  Patrick Rio, PA-C  Cardiologist:  Dr. Liam Roth   Electrophysiologist:  n/a  Referring MD: Patrick Jeans, PA-C   Chief Complaint  Patient presents with  . Follow-up    Atrial Fibrillation    History of Present Illness:    NTHONY Roth is a 70 y.o. male with a hx of HTN and multiple drug intolerances, HL, prior CVA.  He was admitted in 7/17 with syncope felt to be 2/2 orthostatic hypotension.  He was seen by Dr. Liam Roth last week with new onset AF with RVR.  He was asymptomatic.  He was started on Metoprolol Tartrate 25 bid and Xarelto for anticoagulation.  Plan is to proceed with DCCV after 3-4 weeks of uninterrupted anticoagulation.    CHADS2-VASc=4 (age > 74, HTN, prior CVA).  He returns for FU.  He is here alone.  He is overall doing well.  He notes continue issues with bowel discomfort.  He seems to note his HR is higher during these episodes.  He denies chest pain or exertional chest discomfort.  He denies shortness of breath. He denies orthopnea, PND, edema, syncope.    Prior CV studies that were reviewed today include:    Echo 06/21/16 EF 55-60, normal wall motion, grade 2 diastolic dysfunction, mild LAE, PASP 36  Carotid US 06/21/16 Bilateral ICA 1-39  LE arterial US/ABIs 6/17 R 1.2; L 1.2  Echo 12/16 Mild LVH, EF 55-60, no RWMA, Gr 1 DD, mild TR, PASP 38  Myoview 11/16 Low risk, normal perfusion  Renal Artery [place] 3/13 No sig RAS  Past Medical History:  Diagnosis Date  . Colon polyps   . History of chicken pox   . Hyperlipidemia   . Hypertension   . Internal hemorrhoids   . Sciatica   . Stroke [facility]) 2008    Past Surgical History:  Procedure Laterality Date  . COLONOSCOPY  2011  . great toe surgery    . PROSTATE BIOPSY      Current Medications: Current Meds  Medication Sig  . chlorthalidone (HYGROTON) 25 MG tablet TAKE ONE  TABLET BY MOUTH ONCE DAILY  . doxazosin (CARDURA) 2 MG tablet Take 2 mg by mouth at bedtime as needed.  . gabapentin (NEURONTIN) 100 MG capsule Take 1 capsule (100 mg total) by mouth 3 (three) times daily.  . Multiple Vitamins-Minerals (MENS MULTIVITAMIN PLUS) TABS Take 1 tablet by mouth daily. Reported on 04/30/2016  . potassium chloride SA (K-DUR,KLOR-CON) 20 MEQ tablet Take 20 mEq by mouth daily as needed (cramping).  . pregabalin (LYRICA) 50 MG capsule Take 1 capsule (50 mg total) by mouth 2 (two) times daily.  . rivaroxaban (XARELTO) 20 MG TABS tablet Take 1 tablet (20 mg total) by mouth daily with supper.  . [DISCONTINUED] metoprolol tartrate (LOPRESSOR) 25 MG tablet Take 1 tablet (25 mg total) by mouth 2 (two) times daily.     Allergies:   Crestor [rosuvastatin calcium]; Spironolactone; Terazosin; Atenolol; Bystolic [nebivolol hcl]; Hydralazine; Tizanidine; Amlodipine; Clonidine; Hctz [hydrochlorothiazide]; Hydrocodone-acetaminophen; Levofloxacin; Losartan; Maxzide [hydrochlorothiazide w-triamterene]; Olmesartan; and Tussionex pennkinetic er [hydrocod polst-cpm polst er]   Social History   Social History  . Marital status: Married    Spouse name: N/A  . Number of children: 1  . Years of education: 66   Occupational History  . Retired    Social History Main Topics  .  Smoking status: Never Smoker  . Smokeless tobacco: Never Used  . Alcohol use No  . Drug use: No  . Sexual activity: Not Asked   Other Topics Concern  . None   Social History Narrative   Fun: Play golf and build furniture.      Family History:  The patient's family history includes Healthy in his brother and sister; Heart disease in his brother and father; Hypertension (age of onset: 55) in his father; Lung cancer (age of onset: 33) in his mother; Lupus in his daughter; Other in his daughter.   ROS:   Please see the history of present illness.    Review of Systems  Gastrointestinal: Negative for hematochezia  and melena.  Genitourinary: Negative for hematuria.   All other systems reviewed and are negative.   EKGs/Labs/Other Test Reviewed:    EKG:  EKG is  ordered today.  The ekg ordered today demonstrates AFib, HR 144, RBBB  Recent Labs: 01/29/2016: Pro B Natriuretic peptide (BNP) 152.0 09/14/2016: ALT 31; BUN 19; Creat 1.36; Hemoglobin 16.6; Platelets 172; Potassium 3.6; Sodium 141; TSH 0.67   Recent Lipid Panel    Component Value Date/Time   CHOL 209 (H) 02/13/2016 1113   TRIG 71.0 02/13/2016 1113   HDL 52.90 02/13/2016 1113   CHOLHDL 4 02/13/2016 1113   VLDL 14.2 02/13/2016 1113   LDLCALC 142 (H) 02/13/2016 1113     Physical Exam:    VS:  BP 130/60   Pulse (!) 141   Ht 5\' 8"  (1.727 m)   Wt 204 lb 12.8 oz (92.9 kg)   BMI 31.14 kg/m     Wt Readings from Last 3 Encounters:  09/20/16 204 lb 12.8 oz (92.9 kg)  09/17/16 203 lb 4 oz (92.2 kg)  09/14/16 199 lb 6.4 oz (90.4 kg)     Physical Exam  Constitutional: He is oriented to person, place, and time. He appears well-developed and well-nourished. No distress.  HENT:  Head: Normocephalic and atraumatic.  Eyes: No scleral icterus.  Neck: No JVD present.  Cardiovascular: An irregularly irregular rhythm present. Tachycardia present.   No murmur heard. Pulmonary/Chest: Effort normal. He has no wheezes. He has no rales.  Abdominal: Soft. There is no tenderness.  Musculoskeletal: He exhibits no edema.  Neurological: He is alert and oriented to person, place, and time.  Skin: Skin is warm and dry.  Psychiatric: He has a normal mood and affect.    ASSESSMENT:    1. Persistent atrial fibrillation ([place])   2. Essential hypertension    PLAN:    In order of problems listed above:  1. Persistent AF - He has rapid ventricular rate. I reviewed his case with Dr. Liam Roth today.  The patient is largely asymptomatic.  He notes adherence to beta-blocker therapy and anticoagulation since last week when he was seen.  His brother  died recently and the funeral is in 2 days.  He will likely leave tomorrow to fly to [place], [place] and be back later this week.  Since his HR is so fast, Dr. Acie Roth and I discussed potentially proceeding with TEE-DCCV today.  The patient ate breakfast this AM.  We checked with Endo at the hospital and they have no availability to do a TEE-DCCV today.    -  Increase Metoprolol Tartrate to 50 bid  -  Continue Xarelto  -  FU 1 week  -  He knows to seek immediate attention if he is symptomatic with AF (we discussed  this today)  2. HTN - BP controlled.   Medication Adjustments/Labs and Tests Ordered: Current medicines are reviewed at length with the patient today.  Concerns regarding medicines are outlined above.  Medication changes, Labs and Tests ordered today are outlined in the Patient Instructions noted below. Patient Instructions  Medication Instructions:  1. INCREASE METOPROLOL TARTRATE TO 50 MG TWICE DAILY; NEW PRESCRIPTION HAS BEEN SENT IN   Labwork: NONE  Testing/Procedures: NONE  Follow-Up: 09/28/16 @ 9:15 WITH Gregary Blackard, PAC   Any Other Special Instructions Will Be Listed Below (If Applicable).  If you need a refill on your cardiac medications before your next appointment, please call your pharmacy.    Signed, Richardson Dopp, PA-C  09/20/2016 1:17 PM    [facility] [address] [place], [place], [place]  [address] Phone: [phone]; Fax: [phone]

## 2016-09-20 NOTE — Patient Instructions (Addendum)
Medication Instructions:  1. INCREASE METOPROLOL TARTRATE TO 50 MG TWICE DAILY; NEW PRESCRIPTION HAS BEEN SENT IN   Labwork: NONE  Testing/Procedures: NONE  Follow-Up: 09/28/16 @ 9:15 WITH SCOTT WEAVER, PAC   Any Other Special Instructions Will Be Listed Below (If Applicable).  If you need a refill on your cardiac medications before your next appointment, please call your pharmacy.

## 2016-09-21 ENCOUNTER — Ambulatory Visit: Payer: Medicare Other | Admitting: Physician Assistant

## 2016-09-27 NOTE — Progress Notes (Signed)
Cardiology Office Note:    Date:  09/28/2016   ID:  Patrick Roth, DOB 25-Mar-1946, MRN [id number]  PCP:  Leeanne Rio, PA-C  Cardiologist:  Dr. Liam Rogers   Electrophysiologist:  n/a  Referring MD: Brunetta Jeans, PA-C   Chief Complaint  Patient presents with  . Follow-up    Atrial Fibrillation    History of Present Illness:    Patrick Roth is a 70 y.o. male with a hx of HTN and multiple drug intolerances, HL, prior CVA.  He was admitted in 7/17 with syncope felt to be 2/2 orthostatic hypotension.  He was seen by Dr. Liam Rogers recently with new onset AF with RVR.  He has been asymptomatic.  He was started on Metoprolol Tartrate 25 bid and Xarelto for anticoagulation.  Plan is to proceed with DCCV after 3-4 weeks of uninterrupted anticoagulation.    CHADS2-VASc=4 (age > 39, HTN, prior CVA).  I saw him 09/20/16.  His HR remained uncontrolled. He was traveling to his brother's funeral in [place] 2 days later. I adjusted his beta-blocker and brought him back today for further management.  He is back in NSR today.  His HR was in the 40s and he reduced Metoprolol back to 25 bid.  He is doing well. He denies chest pain, dyspnea, syncope, edema, PND.  He denies any bleeding issues.    Prior CV studies that were reviewed today include:    Echo 06/21/16 EF 55-60, normal wall motion, grade 2 diastolic dysfunction, mild LAE, PASP 36  Carotid US 06/21/16 Bilateral ICA 1-39  LE arterial US/ABIs 6/17 R 1.2; L 1.2  Echo 12/16 Mild LVH, EF 55-60, no RWMA, Gr 1 DD, mild TR, PASP 38  Myoview 11/16 Low risk, normal perfusion  Renal Artery [place] 3/13 No sig RAS  Past Medical History:  Diagnosis Date  . Colon polyps   . History of chicken pox   . Hyperlipidemia   . Hypertension   . Internal hemorrhoids   . Sciatica   . Stroke [facility]) 2008    Past Surgical History:  Procedure Laterality Date  . COLONOSCOPY  2011  . great toe surgery    . PROSTATE BIOPSY       Current Medications: Current Meds  Medication Sig  . chlorthalidone (HYGROTON) 25 MG tablet TAKE ONE TABLET BY MOUTH ONCE DAILY  . doxazosin (CARDURA) 2 MG tablet Take 2 mg by mouth at bedtime as needed.  . gabapentin (NEURONTIN) 100 MG capsule Take 1 capsule (100 mg total) by mouth 3 (three) times daily.  . metoprolol tartrate (LOPRESSOR) 25 MG tablet Take 25 mg by mouth 2 (two) times daily.   . Multiple Vitamins-Minerals (MENS MULTIVITAMIN PLUS) TABS Take 1 tablet by mouth daily. Reported on 04/30/2016  . potassium chloride SA (K-DUR,KLOR-CON) 20 MEQ tablet Take 20 mEq by mouth daily as needed (cramping).  . pregabalin (LYRICA) 50 MG capsule Take 1 capsule (50 mg total) by mouth 2 (two) times daily.  . rivaroxaban (XARELTO) 20 MG TABS tablet Take 1 tablet (20 mg total) by mouth daily with supper.     Allergies:   Crestor [rosuvastatin calcium]; Spironolactone; Terazosin; Atenolol; Bystolic [nebivolol hcl]; Hydralazine; Tizanidine; Amlodipine; Clonidine; Hctz [hydrochlorothiazide]; Hydrocodone-acetaminophen; Levofloxacin; Losartan; Maxzide [hydrochlorothiazide w-triamterene]; Olmesartan; and Tussionex pennkinetic er [hydrocod polst-cpm polst er]   Social History   Social History  . Marital status: Married    Spouse name: N/A  . Number of children: 1  . Years of education: 16  Occupational History  . Retired    Social History Main Topics  . Smoking status: Never Smoker  . Smokeless tobacco: Never Used  . Alcohol use No  . Drug use: No  . Sexual activity: Not Asked   Other Topics Concern  . None   Social History Narrative   Fun: Play golf and build furniture.      Family History:  The patient's family history includes Healthy in his brother and sister; Heart disease in his brother and father; Hypertension (age of onset: 54) in his father; Lung cancer (age of onset: 42) in his mother; Lupus in his daughter; Other in his daughter.   ROS:   Please see the history of  present illness.    ROS All other systems reviewed and are negative.   EKGs/Labs/Other Test Reviewed:    EKG:  EKG is  ordered today.  The ekg ordered today demonstrates Sinus bradycardia, HR 53, left axis deviation, IVCD, right bundle branch block, QTc 426 ms  Recent Labs: 01/29/2016: Pro B Natriuretic peptide (BNP) 152.0 09/14/2016: ALT 31; BUN 19; Creat 1.36; Hemoglobin 16.6; Platelets 172; Potassium 3.6; Sodium 141; TSH 0.67   Recent Lipid Panel    Component Value Date/Time   CHOL 209 (H) 02/13/2016 1113   TRIG 71.0 02/13/2016 1113   HDL 52.90 02/13/2016 1113   CHOLHDL 4 02/13/2016 1113   VLDL 14.2 02/13/2016 1113   LDLCALC 142 (H) 02/13/2016 1113     Physical Exam:    VS:  BP (!) 150/74   Pulse (!) 53   Ht 5\' 8"  (1.727 m)   Wt 204 lb 12.8 oz (92.9 kg)   BMI 31.14 kg/m     Wt Readings from Last 3 Encounters:  09/28/16 204 lb 12.8 oz (92.9 kg)  09/20/16 204 lb 12.8 oz (92.9 kg)  09/17/16 203 lb 4 oz (92.2 kg)     Physical Exam  Constitutional: He is oriented to person, place, and time. He appears well-developed and well-nourished. No distress.  HENT:  Head: Normocephalic and atraumatic.  Eyes: No scleral icterus.  Neck: No JVD present.  Cardiovascular: Normal rate, regular rhythm and normal heart sounds.   No murmur heard. Pulmonary/Chest: Effort normal. He has no wheezes. He has no rales.  Abdominal: Soft. There is no tenderness.  Musculoskeletal: He exhibits no edema.  Neurological: He is alert and oriented to person, place, and time.  Skin: Skin is warm and dry.  Psychiatric: He has a normal mood and affect.    ASSESSMENT:    1. Paroxysmal atrial fibrillation (HCC)   2. Essential hypertension    PLAN:    In order of problems listed above:  1. Parox AF - He is back in sinus rhythm. His heart rate was slow and he reduced his dose of metoprolol back to 25 mg twice a day. I suspect, by his symptoms, that he has recurrent episodes of atrial fibrillation.  I have asked him to take metoprolol 50 mg twice a day should his heart rate go above 100, sustained. Given his stroke risk, he will need to remain on anticoagulation. Continue Xarelto. He does have a history of snoring but apparently had a negative sleep study in the past. He does have multiple medication intolerances. Question if he would be a candidate for atrial for ablation ablation. I will review with Dr. Acie Fredrickson. If he agrees, I will refer him to Dr. Rayann Heman or Dr. [name].   -  Continue Metoprolol 25 bid  -  Continue Xarelto   -  BMET, CBC in 1 month  -  Consider EP referral  2. HTN - Borderline control.  He has not had any medications yet today.  Continue to monitor.  Medication Adjustments/Labs and Tests Ordered: Current medicines are reviewed at length with the patient today.  Concerns regarding medicines are outlined above.  Medication changes, Labs and Tests ordered today are outlined in the Patient Instructions noted below. Patient Instructions  Medication Instructions:  Your physician recommends that you continue on your current medications as directed. Please refer to the Current Medication list given to you today.  PER Patrick Roth, PAC IF YOUR HEART INCREASES ABOVE 100 THEN INCREASE METOPROLOL TO 50 MG TWICE DAILY UNTIL YOUR HR IS STAYING BELOW 75  Labwork: YOU WILL NEED LAB WORK TO BE DONE THE 1ST WEEK OF DEC 2017 BMET, CBC   Testing/Procedures: NONE  Follow-Up: DR. Acie Fredrickson IN 3 MONTHS  Any Other Special Instructions Will Be Listed Below (If Applicable).  If you need a refill on your cardiac medications before your next appointment, please call your pharmacy.  Signed, Patrick Dopp, PA-C  09/28/2016 5:26 PM    [facility] [place], [place], [place]  [address] Phone: [phone]; Fax: [phone]

## 2016-09-28 ENCOUNTER — Encounter: Payer: Self-pay | Admitting: Physician Assistant

## 2016-09-28 ENCOUNTER — Ambulatory Visit (INDEPENDENT_AMBULATORY_CARE_PROVIDER_SITE_OTHER): Payer: Medicare Other | Admitting: Physician Assistant

## 2016-09-28 VITALS — BP 150/74 | HR 53 | Ht 68.0 in | Wt 204.8 lb

## 2016-09-28 DIAGNOSIS — I480 Paroxysmal atrial fibrillation: Secondary | ICD-10-CM | POA: Diagnosis not present

## 2016-09-28 DIAGNOSIS — I10 Essential (primary) hypertension: Secondary | ICD-10-CM

## 2016-09-28 NOTE — Patient Instructions (Addendum)
Medication Instructions:  Your physician recommends that you continue on your current medications as directed. Please refer to the Current Medication list given to you today.  PER SCOTT WEAVER, PAC IF YOUR HEART INCREASES ABOVE 100 THEN INCREASE METOPROLOL TO 50 MG TWICE DAILY UNTIL YOUR HR IS STAYING BELOW 75  Labwork: YOU WILL NEED LAB WORK TO BE DONE THE 1ST WEEK OF DEC 2017 BMET, CBC   Testing/Procedures: NONE  Follow-Up: DR. Acie Fredrickson IN 3 MONTHS  Any Other Special Instructions Will Be Listed Below (If Applicable).  If you need a refill on your cardiac medications before your next appointment, please call your pharmacy.

## 2016-10-01 ENCOUNTER — Ambulatory Visit (HOSPITAL_COMMUNITY): Payer: Medicare Other | Attending: Cardiovascular Disease

## 2016-10-01 ENCOUNTER — Telehealth: Payer: Self-pay | Admitting: Interventional Cardiology

## 2016-10-01 ENCOUNTER — Other Ambulatory Visit: Payer: Self-pay

## 2016-10-01 DIAGNOSIS — I481 Persistent atrial fibrillation: Secondary | ICD-10-CM | POA: Diagnosis not present

## 2016-10-01 DIAGNOSIS — I119 Hypertensive heart disease without heart failure: Secondary | ICD-10-CM | POA: Diagnosis not present

## 2016-10-01 DIAGNOSIS — I340 Nonrheumatic mitral (valve) insufficiency: Secondary | ICD-10-CM | POA: Diagnosis not present

## 2016-10-01 DIAGNOSIS — I4891 Unspecified atrial fibrillation: Secondary | ICD-10-CM | POA: Diagnosis present

## 2016-10-01 DIAGNOSIS — I313 Pericardial effusion (noninflammatory): Secondary | ICD-10-CM | POA: Diagnosis not present

## 2016-10-01 DIAGNOSIS — I4819 Other persistent atrial fibrillation: Secondary | ICD-10-CM

## 2016-10-01 DIAGNOSIS — E785 Hyperlipidemia, unspecified: Secondary | ICD-10-CM | POA: Insufficient documentation

## 2016-10-01 DIAGNOSIS — I071 Rheumatic tricuspid insufficiency: Secondary | ICD-10-CM | POA: Diagnosis not present

## 2016-10-01 NOTE — Telephone Encounter (Signed)
   Patient called because he was having some HR in the 40s and 50s and was concerned with what may be too low.  He does not report any lightheadedness, dizziness or syncope.  I instructed him to let us know if he has any of those sx.  He should continue with the meds he is taking for AFib.  He also reported an occasional slight headache.  I asked him to check hi BP several times/week and report to Dr. Acie Fredrickson.  Not currently having a headache.    I reviewed the last OV.  ECG showed HR of 53 with SB and PACs.    Jettie Booze, MD

## 2016-10-03 NOTE — Telephone Encounter (Signed)
[name], Are you ok with me sending him to EP for further management of AFib? Richardson Dopp, PA-C   10/03/2016 9:14 PM

## 2016-10-04 NOTE — Telephone Encounter (Signed)
Sure

## 2016-10-04 NOTE — Telephone Encounter (Signed)
I reviewed with Dr. Liam Rogers. Please refer to EP (Dr. Thompson Grayer or Dr. Allegra Lai) for evaluation and further management of his atrial fibrillation. Richardson Dopp, PA-C   10/04/2016 5:29 PM

## 2016-10-05 ENCOUNTER — Other Ambulatory Visit: Payer: Self-pay | Admitting: *Deleted

## 2016-10-05 DIAGNOSIS — I4819 Other persistent atrial fibrillation: Secondary | ICD-10-CM

## 2016-10-05 NOTE — Telephone Encounter (Signed)
S/w pt today in regards to message from Harris and Dr. Acie Fredrickson to refer pt to EP for further management of his A-Fib. Pt is agreeable to this plan of care. Pt also states to me he is on Metoprolol Tart 25 mg BID and his HR a couple of times has gone down in the 40's, though mostly in the high 50's and 60's. I d/w Brynda Rim. PA who advises if pt's HR is dropping below 50 then to decrease Metoprolol Tart down to 12.5 mg BID. Pt advised to monitor his HR and to call if he see's HR still going to low if he takes 12.5 mg of the Metoprolol. Pt agreeable to plan of care.

## 2016-10-10 ENCOUNTER — Other Ambulatory Visit: Payer: Self-pay | Admitting: Physician Assistant

## 2016-10-11 ENCOUNTER — Encounter: Payer: Self-pay | Admitting: Cardiology

## 2016-10-11 ENCOUNTER — Ambulatory Visit (INDEPENDENT_AMBULATORY_CARE_PROVIDER_SITE_OTHER): Payer: Medicare Other | Admitting: Cardiology

## 2016-10-11 VITALS — BP 142/80 | HR 52 | Ht 68.0 in | Wt 203.8 lb

## 2016-10-11 DIAGNOSIS — I481 Persistent atrial fibrillation: Secondary | ICD-10-CM | POA: Diagnosis not present

## 2016-10-11 DIAGNOSIS — I4819 Other persistent atrial fibrillation: Secondary | ICD-10-CM

## 2016-10-11 MED ORDER — DRONEDARONE HCL 400 MG PO TABS: 400.0000 mg | ORAL_TABLET | Freq: Two times a day (BID) | ORAL | 4 refills | Status: DC

## 2016-10-11 NOTE — Progress Notes (Signed)
Electrophysiology Office Note   Date:  10/11/2016   ID:  ISAI PETRICCA, DOB [birth year], MRN [id number]  PCP:  Leeanne Rio, PA-C  Cardiologist:  [name] Primary Electrophysiologist:  Talyn Dessert Meredith Leeds, MD    Chief Complaint  Patient presents with  . CONSULT    Persistent AFib     History of Present Illness: Patrick Roth is a 70 y.o. male who presents today for electrophysiology evaluation.   Hx of HTN and multiple drug intolerances, HL, prior CVA. He was admitted in 7/17 with syncope felt to be 2/2 orthostatic hypotension. He was found to be in new onset AF with RVR. He has been asymptomatic. He was started on Metoprolol Tartrate 25 bid and Xarelto for anticoagulation. He says that at times, he gets weak and short of breath. He says that it feels like his legs are full of bricks. Prior to his diagnosis with atrial fibrillation, he went to his primary physician to see if this could be a heart abnormality, but his EKGs were normal sinus. He has no other major complaints. He feels well today.   Today, he denies symptoms of palpitations, chest pain, shortness of breath, orthopnea, PND, lower extremity edema, claudication, dizziness, presyncope, syncope, bleeding, or neurologic sequela. The patient is tolerating medications without difficulties and is otherwise without complaint today.    Past Medical History:  Diagnosis Date  . Colon polyps   . History of chicken pox   . Hyperlipidemia   . Hypertension   . Internal hemorrhoids   . Sciatica   . Stroke [facility]) 2008   Past Surgical History:  Procedure Laterality Date  . COLONOSCOPY  2011  . great toe surgery    . PROSTATE BIOPSY       Current Outpatient Prescriptions  Medication Sig Dispense Refill  . chlorthalidone (HYGROTON) 25 MG tablet TAKE ONE TABLET BY MOUTH ONCE DAILY 30 tablet 1  . doxazosin (CARDURA) 2 MG tablet Take 2 mg by mouth at bedtime as needed.  1  . gabapentin (NEURONTIN) 100 MG capsule Take 1  capsule (100 mg total) by mouth 3 (three) times daily. 30 capsule 3  . metoprolol tartrate (LOPRESSOR) 25 MG tablet Take 25 mg by mouth 2 (two) times daily.   0  . Multiple Vitamins-Minerals (MENS MULTIVITAMIN PLUS) TABS Take 1 tablet by mouth daily. Reported on 04/30/2016    . potassium chloride SA (K-DUR,KLOR-CON) 20 MEQ tablet Take 20 mEq by mouth daily as needed (cramping).    . pregabalin (LYRICA) 50 MG capsule Take 1 capsule (50 mg total) by mouth 2 (two) times daily. 60 capsule 1  . rivaroxaban (XARELTO) 20 MG TABS tablet Take 1 tablet (20 mg total) by mouth daily with supper. 30 tablet 11   No current facility-administered medications for this visit.     Allergies:   Crestor [rosuvastatin calcium]; Spironolactone; Terazosin; Atenolol; Bystolic [nebivolol hcl]; Hydralazine; Tizanidine; Amlodipine; Clonidine; Hctz [hydrochlorothiazide]; Hydrocodone-acetaminophen; Levofloxacin; Losartan; Maxzide [hydrochlorothiazide w-triamterene]; Olmesartan; and Tussionex pennkinetic er [hydrocod polst-cpm polst er]   Social History:  The patient  reports that he has never smoked. He has never used smokeless tobacco. He reports that he does not drink alcohol or use drugs.   Family History:  The patient's family history includes Healthy in his brother and sister; Heart disease in his brother and father; Hypertension (age of onset: 36) in his father; Lung cancer (age of onset: 54) in his mother; Lupus in his daughter; Other in his daughter.  ROS:  Please see the history of present illness.   Otherwise, review of systems is positive for chest pain, back pain.   All other systems are reviewed and negative.    PHYSICAL EXAM: VS:  BP (!) 142/80   Pulse (!) 52   Ht 5\' 8"  (1.727 m)   Wt 203 lb 12.8 oz (92.4 kg)   BMI 30.99 kg/m  , BMI Body mass index is 30.99 kg/m. GEN: Well nourished, well developed, in no acute distress  HEENT: normal  Neck: no JVD, carotid bruits, or masses Cardiac: RRR; no murmurs,  rubs, or gallops,no edema  Respiratory:  clear to auscultation bilaterally, normal work of breathing GI: soft, nontender, nondistended, + BS MS: no deformity or atrophy  Skin: warm and dry Neuro:  Strength and sensation are intact Psych: euthymic mood, full affect  EKG:  EKG is ordered today. Personal review of the ekg ordered shows sinus rhythm, 1 degree AV block, RBBB, LAFB, rate 52  Recent Labs: 01/29/2016: Pro B Natriuretic peptide (BNP) 152.0 09/14/2016: ALT 31; BUN 19; Creat 1.36; Hemoglobin 16.6; Platelets 172; Potassium 3.6; Sodium 141; TSH 0.67    Lipid Panel     Component Value Date/Time   CHOL 209 (H) 02/13/2016 1113   TRIG 71.0 02/13/2016 1113   HDL 52.90 02/13/2016 1113   CHOLHDL 4 02/13/2016 1113   VLDL 14.2 02/13/2016 1113   LDLCALC 142 (H) 02/13/2016 1113     Wt Readings from Last 3 Encounters:  10/11/16 203 lb 12.8 oz (92.4 kg)  09/28/16 204 lb 12.8 oz (92.9 kg)  09/20/16 204 lb 12.8 oz (92.9 kg)      Other studies Reviewed: Additional studies/ records that were reviewed today include: TTE 10/01/16  Review of the above records today demonstrates:  - Left ventricle: The cavity size was normal. Systolic function was   normal. The estimated ejection fraction was in the range of 55%   to 60%. Wall motion was normal; there were no regional wall   motion abnormalities. Doppler parameters are consistent with a   reversible restrictive pattern, indicative of decreased left   ventricular diastolic compliance and/or increased left atrial   pressure (grade 3 diastolic dysfunction). Doppler parameters are   consistent with high ventricular filling pressure. - Aortic valve: Transvalvular velocity was within the normal range.   There was no stenosis. - Mitral valve: Transvalvular velocity was within the normal range.   There was no evidence for stenosis. There was mild regurgitation. - Right ventricle: The cavity size was mildly dilated. Wall   thickness was  normal. Systolic function was normal. - Atrial septum: No defect or patent foramen ovale was identified   by color flow Doppler. - Tricuspid valve: There was mild regurgitation. - Pulmonary arteries: Systolic pressure was moderately to severely   increased. PA peak pressure: 57 mm Hg (S). - Pericardium, extracardiac: A trivial pericardial effusion was   identified.   ASSESSMENT AND PLAN:  1.  Persistent atrial fibrillation: on Xarelto and metoprolol. He says he feels much better since starting his metoprolol, but he does still have some symptoms from his atrial fibrillation. I discussed with him the possibility of ablation versus medical management. We discussed the risks and benefits of ablation. Risks include bleeding, tamponade, heart block, stroke, and damage to surrounding organs. He would rather try medical management prior to ablation. We Cerissa Zeiger start him on 400 mg Multaq. I Aikeem Lilley see him back in 3 months to make a further determination of whether  or not he Astin Rape tolerate the Multaq. If not he would likely benefit from ablation.  This patients CHA2DS2-VASc Score and unadjusted Ischemic Stroke Rate (% per year) is equal to 4.8 % stroke rate/year from a score of 4  Above score calculated as 1 point each if present [CHF, HTN, DM, Vascular=MI/PAD/Aortic Plaque, Age if 65-74, or Male] Above score calculated as 2 points each if present [Age > 75, or Stroke/TIA/TE]   2. Hypertension: Mildly elevated today but well-controlled on other visits. We'll continue his current medications    Current medicines are reviewed at length with the patient today.   The patient does not have concerns regarding his medicines.  The following changes were made today:  Multaq  Labs/ tests ordered today include:  Orders Placed This Encounter  Procedures  . EKG 12-Lead     Disposition:   FU with Tyneisha Hegeman 3 months  Signed, Baran Kuhrt Meredith Leeds, MD  10/11/2016 12:08 PM     [place] [address] [phone] (office) [phone] (fax)

## 2016-10-11 NOTE — Addendum Note (Signed)
Addended by: Briant Cedar R on: 10/11/2016 12:13 PM   Modules accepted: Orders

## 2016-10-11 NOTE — Patient Instructions (Addendum)
Medication Instructions:  Your physician has recommended you make the following change in your medication:  1) START Multaq 400 mg twice a day  Labwork: None Ordered   Testing/Procedures: None Ordered   Follow-Up: Your physician recommends that you schedule a follow-up appointment in: 3 months with Dr. [name]  Any Other Special Instructions Will Be Listed Below (If Applicable).     If you need a refill on your cardiac medications before your next appointment, please call your pharmacy.

## 2016-10-25 ENCOUNTER — Other Ambulatory Visit (INDEPENDENT_AMBULATORY_CARE_PROVIDER_SITE_OTHER): Payer: Medicare Other

## 2016-10-25 DIAGNOSIS — I480 Paroxysmal atrial fibrillation: Secondary | ICD-10-CM

## 2016-10-25 DIAGNOSIS — I10 Essential (primary) hypertension: Secondary | ICD-10-CM | POA: Diagnosis not present

## 2016-10-25 LAB — BASIC METABOLIC PANEL
BUN: 13 mg/dL (ref 7–25)
CALCIUM: 9.3 mg/dL (ref 8.6–10.3)
CHLORIDE: 102 mmol/L (ref 98–110)
CO2: 32 mmol/L — AB (ref 20–31)
Creat: 1.2 mg/dL — ABNORMAL HIGH (ref 0.70–1.18)
GLUCOSE: 87 mg/dL (ref 65–99)
POTASSIUM: 3.9 mmol/L (ref 3.5–5.3)
SODIUM: 140 mmol/L (ref 135–146)

## 2016-10-25 LAB — CBC
HEMATOCRIT: 46.9 % (ref 38.5–50.0)
HEMOGLOBIN: 15.5 g/dL (ref 13.2–17.1)
MCH: 29.1 pg (ref 27.0–33.0)
MCHC: 33 g/dL (ref 32.0–36.0)
MCV: 88 fL (ref 80.0–100.0)
MPV: 10.4 fL (ref 7.5–12.5)
Platelets: 180 10*3/uL (ref 140–400)
RBC: 5.33 MIL/uL (ref 4.20–5.80)
RDW: 15 % (ref 11.0–15.0)
WBC: 4.3 10*3/uL (ref 3.8–10.8)

## 2016-10-26 ENCOUNTER — Telehealth: Payer: Self-pay | Admitting: *Deleted

## 2016-10-26 NOTE — Telephone Encounter (Signed)
Pt notified of lab results by phone with verbal understanding.  

## 2016-11-21 ENCOUNTER — Other Ambulatory Visit: Payer: Self-pay | Admitting: Physician Assistant

## 2016-11-25 NOTE — Telephone Encounter (Signed)
Indication for chronic opioid: Cervical neck pain, Bilateral hip pain Medication and dose: Lyrica 50 mg # pills per month: #60 1 RF on 09/13/16 Last UDS date: None Pain contract signed (Y/N): No Date narcotic database last reviewed (include red flags):   Last ov: 09/13/16 f/u in 2 weeks Neurosurgery ???

## 2016-11-26 NOTE — Telephone Encounter (Signed)
Spoke with patient and he is tolerating the Lyrica for pain. He states the Lyrica is working better than the Gabapentin. Advised we will fax the rx to the pharmacy for him. He is scheduled for an appointment next week to follow up. He is agreeable. He does want to research about getting laser surgery.

## 2016-11-26 NOTE — Telephone Encounter (Signed)
Refill for Lyrica printed.  Ok to fax in. Please contact patient and schedule for a follow-up. Please assess how he is doing on the Lyrica compared to when he was on the Gabapentin.

## 2016-11-30 ENCOUNTER — Telehealth: Payer: Self-pay | Admitting: *Deleted

## 2016-11-30 ENCOUNTER — Ambulatory Visit (INDEPENDENT_AMBULATORY_CARE_PROVIDER_SITE_OTHER): Payer: Medicare Other | Admitting: Physician Assistant

## 2016-11-30 ENCOUNTER — Encounter: Payer: Self-pay | Admitting: Physician Assistant

## 2016-11-30 VITALS — BP 152/80 | HR 62 | Temp 98.3°F | Resp 16 | Ht 68.0 in | Wt 211.0 lb

## 2016-11-30 DIAGNOSIS — I10 Essential (primary) hypertension: Secondary | ICD-10-CM

## 2016-11-30 DIAGNOSIS — I481 Persistent atrial fibrillation: Secondary | ICD-10-CM | POA: Diagnosis not present

## 2016-11-30 DIAGNOSIS — E785 Hyperlipidemia, unspecified: Secondary | ICD-10-CM | POA: Diagnosis not present

## 2016-11-30 DIAGNOSIS — R221 Localized swelling, mass and lump, neck: Secondary | ICD-10-CM | POA: Diagnosis not present

## 2016-11-30 DIAGNOSIS — Z8673 Personal history of transient ischemic attack (TIA), and cerebral infarction without residual deficits: Secondary | ICD-10-CM

## 2016-11-30 DIAGNOSIS — I4819 Other persistent atrial fibrillation: Secondary | ICD-10-CM

## 2016-11-30 LAB — COMPREHENSIVE METABOLIC PANEL
ALBUMIN: 3.9 g/dL (ref 3.5–5.2)
ALK PHOS: 64 U/L (ref 39–117)
ALT: 16 U/L (ref 0–53)
AST: 21 U/L (ref 0–37)
BILIRUBIN TOTAL: 1.3 mg/dL — AB (ref 0.2–1.2)
BUN: 17 mg/dL (ref 6–23)
CALCIUM: 9.5 mg/dL (ref 8.4–10.5)
CO2: 35 mEq/L — ABNORMAL HIGH (ref 19–32)
Chloride: 103 mEq/L (ref 96–112)
Creatinine, Ser: 1.28 mg/dL (ref 0.40–1.50)
GFR: 71.08 mL/min (ref >60.00)
Glucose, Bld: 107 mg/dL — ABNORMAL HIGH (ref 70–99)
Potassium: 3.5 mEq/L (ref 3.5–5.1)
Sodium: 143 mEq/L (ref 135–145)
Total Protein: 6.8 g/dL (ref 6.0–8.3)

## 2016-11-30 LAB — LIPID PANEL
CHOLESTEROL: 220 mg/dL — AB (ref 0–200)
HDL: 46.3 mg/dL (ref >39.00)
LDL Cholesterol: 154 mg/dL — ABNORMAL HIGH (ref 0–99)
NONHDL: 174.12
Total CHOL/HDL Ratio: 5
Triglycerides: 102 mg/dL (ref 0.0–149.0)
VLDL: 20.4 mg/dL (ref 0.0–40.0)

## 2016-11-30 MED ORDER — HYDROCHLOROTHIAZIDE 12.5 MG PO CAPS: 12.5000 mg | ORAL_CAPSULE | Freq: Every day | ORAL | 1 refills | Status: DC

## 2016-11-30 NOTE — Progress Notes (Signed)
Pre visit review using our clinic review tool, if applicable. No additional management support is needed unless otherwise documented below in the visit note. 

## 2016-11-30 NOTE — Progress Notes (Signed)
Patient presents to clinic today for follow-up of chronic medical issues. Patient with history of medication self-titration and non-compliance.   Patient with long-standing history of hypertension -- Most recently well controlled with Metoprolol and Chlorthalidone along with potassium supplementation. Patient developed atrial fibrillation for which he has been assessed by Cardiology. Was started on Xarelto and at last visit with Cardiology (Camnitz) Multaq 400 mg BID started due to continued a. Fib symptoms. Patient declined ablation.  Patient endorses since that visit he felt medications were too strong and he did not need all of them. Patient states that he stopped the Multaq, chlorthalidone and potassium supplement. Cites some mild aches with chlorthalidone and potassium. Is taking Metoprolol as directed. Patient denies chest pain, palpitations, lightheadedness, dizziness, vision changes or frequent headaches. Patient has stopped his Xarelto due to cost of medication. Is only taking 81 mg ASA daily at present. States he has not notified his Cardiologist office about this.   BP Readings from Last 3 Encounters:  11/30/16 (!) 152/80  10/11/16 (!) 142/80  09/28/16 (!) 150/74   Hyperlipidemia -- Patient previously started on simvastatin for hyperlipidemia. Patient with history of CVA and Paroxysmal atrial fibrillation.  Last LDL at 142. Patient stopped medication several months back as he felt he didn't need the medication.   Past Medical History:  Diagnosis Date  . Colon polyps   . History of chicken pox   . Hyperlipidemia   . Hypertension   . Internal hemorrhoids   . Sciatica   . Stroke [facility]) 2008    Current Outpatient Prescriptions on File Prior to Visit  Medication Sig Dispense Refill  . LYRICA 50 MG capsule TAKE ONE CAPSULE BY MOUTH TWICE DAILY 60 capsule 1  . metoprolol tartrate (LOPRESSOR) 25 MG tablet Take 25 mg by mouth 2 (two) times daily.   0   No current facility-administered  medications on file prior to visit.     Allergies  Allergen Reactions  . Crestor [Rosuvastatin Calcium] Shortness Of Breath    Muscle cramps  . Spironolactone Shortness Of Breath  . Terazosin Shortness Of Breath, Palpitations and Other (See Comments)    Nerve pain  . Atenolol Other (See Comments)    Drowsiness and "flu sxs"  . Bystolic [Nebivolol Hcl] Other (See Comments)    GI issues  . Hydralazine Other (See Comments)    Joint swelling  . Tizanidine Other (See Comments)    Syncope, Elevated BP/pulse  . Amlodipine Other (See Comments)  . Clonidine Other (See Comments)  . Hctz [Hydrochlorothiazide] Other (See Comments)    Other reaction(s): Unknown  . Hydrocodone-Acetaminophen Other (See Comments)  . Levofloxacin Other (See Comments)  . Losartan Other (See Comments)  . Maxzide [Hydrochlorothiazide W-Triamterene] Other (See Comments)  . Olmesartan Other (See Comments)  . Tussionex Pennkinetic Er [Hydrocod Polst-Cpm Polst Er] Other (See Comments)    Other reaction(s): Unknown    Family History  Problem Relation Age of Onset  . Hypertension Father 15    Deceased  . Heart disease Father   . Lung cancer Mother 62    Deceased  . Healthy Sister     x3  . Healthy Brother     x4  . Heart disease Brother     #5  . Other Daughter     Alpha Thalassemia  . Lupus Daughter     #2-deceased  . Diabetes Neg Hx   . Heart attack Neg Hx   . Hyperlipidemia Neg Hx   . Sudden  death Neg Hx     Social History   Social History  . Marital status: Married    Spouse name: N/A  . Number of children: 1  . Years of education: 35   Occupational History  . Retired    Social History Main Topics  . Smoking status: Never Smoker  . Smokeless tobacco: Never Used  . Alcohol use No  . Drug use: No  . Sexual activity: Not Asked   Other Topics Concern  . None   Social History Narrative   Fun: Play golf and build furniture.    Review of Systems - See HPI.  All other ROS are  negative.  BP (!) 152/80   Pulse 62   Temp 98.3 F (36.8 C) (Oral)   Resp 16   Ht '5\' 8"'$  (1.727 m)   Wt 211 lb (95.7 kg)   SpO2 98%   BMI 32.08 kg/m   Physical Exam  Constitutional: He is oriented to person, place, and time and well-developed, well-nourished, and in no distress.  HENT:  Head: Normocephalic and atraumatic.  Right Ear: External ear normal.  Left Ear: External ear normal.  Nose: Nose normal.  Mouth/Throat: Oropharynx is clear and moist.  Eyes: Conjunctivae are normal.  Neck: Neck supple.  R-sided lateral upper neck SQ mass noted, about 2 cm in diameter. Mobile and firm but non-indurated or tender. No thyromegaly or nodule palpated.   Cardiovascular: Normal rate, regular rhythm, normal heart sounds and intact distal pulses.   Pulmonary/Chest: Effort normal and breath sounds normal. No respiratory distress. He has no wheezes. He has no rales. He exhibits no tenderness.  Neurological: He is alert and oriented to person, place, and time.  Skin: Skin is warm and dry. No rash noted.  Psychiatric: Affect normal.  Vitals reviewed.   Recent Results (from the past 2160 hour(s))  TSH     Status: None   Collection Time: 09/14/16 10:19 AM  Result Value Ref Range   TSH 0.67 0.40 - 4.50 mIU/L  Comp Met (CMET)     Status: Abnormal   Collection Time: 09/14/16 10:19 AM  Result Value Ref Range   Sodium 141 135 - 146 mmol/L   Potassium 3.6 3.5 - 5.3 mmol/L   Chloride 101 98 - 110 mmol/L   CO2 32 (H) 20 - 31 mmol/L   Glucose, Bld 90 65 - 99 mg/dL   BUN 19 7 - 25 mg/dL   Creat 1.36 (H) 0.70 - 1.18 mg/dL    Comment:   For patients > or = 72 years of age: The upper reference limit for Creatinine is approximately 13% higher for people identified as African-American.      Total Bilirubin 1.5 (H) 0.2 - 1.2 mg/dL   Alkaline Phosphatase 64 40 - 115 U/L   AST 28 10 - 35 U/L   ALT 31 9 - 46 U/L   Total Protein 6.5 6.1 - 8.1 g/dL   Albumin 3.8 3.6 - 5.1 g/dL   Calcium 9.2 8.6  - 10.3 mg/dL  CBC w/Diff     Status: None   Collection Time: 09/14/16 10:19 AM  Result Value Ref Range   WBC 5.4 3.8 - 10.8 K/uL   RBC 5.71 4.20 - 5.80 MIL/uL   Hemoglobin 16.6 13.2 - 17.1 g/dL   HCT 49.4 38.5 - 50.0 %   MCV 86.5 80.0 - 100.0 fL   MCH 29.1 27.0 - 33.0 pg   MCHC 33.6 32.0 - 36.0 g/dL  RDW 13.8 11.0 - 15.0 %   Platelets 172 140 - 400 K/uL   MPV 10.3 7.5 - 12.5 fL   Neutro Abs 2,970 1,500 - 7,800 cells/uL   Lymphs Abs 1,728 850 - 3,900 cells/uL   Monocytes Absolute 648 200 - 950 cells/uL   Eosinophils Absolute 54 15 - 500 cells/uL   Basophils Absolute 0 0 - 200 cells/uL   Neutrophils Relative % 55 %   Lymphocytes Relative 32 %   Monocytes Relative 12 %   Eosinophils Relative 1 %   Basophils Relative 0 %   Smear Review Criteria for review not met   Basic Metabolic Panel (BMET)     Status: Abnormal   Collection Time: 10/25/16 11:40 AM  Result Value Ref Range   Sodium 140 135 - 146 mmol/L   Potassium 3.9 3.5 - 5.3 mmol/L   Chloride 102 98 - 110 mmol/L   CO2 32 (H) 20 - 31 mmol/L   Glucose, Bld 87 65 - 99 mg/dL   BUN 13 7 - 25 mg/dL   Creat 1.20 (H) 0.70 - 1.18 mg/dL    Comment:   For patients > or = 72 years of age: The upper reference limit for Creatinine is approximately 13% higher for people identified as African-American.      Calcium 9.3 8.6 - 10.3 mg/dL  CBC     Status: None   Collection Time: 10/25/16 11:40 AM  Result Value Ref Range   WBC 4.3 3.8 - 10.8 K/uL   RBC 5.33 4.20 - 5.80 MIL/uL   Hemoglobin 15.5 13.2 - 17.1 g/dL   HCT 46.9 38.5 - 50.0 %   MCV 88.0 80.0 - 100.0 fL   MCH 29.1 27.0 - 33.0 pg   MCHC 33.0 32.0 - 36.0 g/dL   RDW 15.0 11.0 - 15.0 %   Platelets 180 140 - 400 K/uL   MPV 10.4 7.5 - 12.5 fL    Assessment/Plan: 1. Hyperlipidemia, unspecified hyperlipidemia type Patient stopped medication on his own as he felt he did not need. Discussed high risk of adverse event due to hypertension, hyperlipidemia, a fib and history of  stroke. Will repeat CMP and lipids today. Patient states he will consider medication.  - Comp Met (CMET) - Lipid panel  2. Neck mass Potential lipoma but will get [place] to further assess. Will refer to specialty based on results.  - US Soft Tissue Head/Neck; Future  3. Essential hypertension BP above goal today. Patient has stopped multiple medications as he did not like them but can not give any noted side effect other than some mild aches with potassium and chlorthalidone. Hs significant list of "allergies/intolerances" to most classes of BP medication. Will add on HCTZ 12.5 mg daily to see how patient tolerates. DASH diet encouraged. FU scheduled.   4. Persistent atrial fibrillation ([place]) Followed by Cardiology. Has stopped Multaq. Has decreased Metoprolol on his own. Heart in sinus rhythm at today's visit. Has also stopped Xarelto and switched to 81 mg ASA (due to cost). Discussed risk of self titration or discontinuation of medications without informing specialist. Xarelto samples given. He is to restart at 20 mg daily. He is to call Cardiology to discuss ongoing management. Alarm signs/symptoms discussed with patient.   Discussed that if non-compliance and self titration of medications continue, or if he will not let us help manage his chronic medical conditions to lower risk for stroke/heart attack, I will no longer be able to care for him.  Leeanne Rio, PA-C

## 2016-11-30 NOTE — Telephone Encounter (Signed)
Patient called to report that he is not able to afford the multaq and he stated that he is not interested in taking the medication. Xarelto was removed from his med list and he stated that he was not taking it due to cost. His pcp gave him some samples. He wanted to know if he could just take two asa daily. He states that he does not feel any different on this medication. I made him aware that he could apply for patient assistance and he stated that he would think about that. He just kept saying that he wanted to feel better and he did not want to take a lot of medications that would make him lay around and sleep all of the time. Patient can be reached at [phone]. Please advise. Thanks, MI

## 2016-11-30 NOTE — Patient Instructions (Signed)
Please restart the Xarelto -- I have given you a month supply. Please call your cardiologist to discuss other options for treatment.  You will take this instead of the 81 mg Aspirin.  I will be messaging them to make them aware you have stopped your medications -- You always need to make your treating provider aware if you are having an issue with medication. Thankfully you heart rhythm and rate are normal today but this can change.   Please continue the Metoprolol as directed. Start the low-dose of the Hydrochlorothiazide to help with BP. We are reassessing your cholesterol and electrolytes today.  Please consider letting me restart cholesterol medication.   You have a history of stroke and have stopped your cholesterol medication as well. Giving history of atrial fibrillation, history of stroke., elevated cholesterol and hypertension you are at a significant risk for stroke and heart attack. We need to get these all under control ASAP to lower your risk. If you will not let me treat these things I will not be able to continue taking care of you.   I am setting you up for an [place] of your neck to assess the mass underneath the skin. We may need a consult from general surgery.  I am setting you up with a new Neurosurgeon for management of chronic back issues. Continue the Lyrica as directed.

## 2016-12-01 MED ORDER — RIVAROXABAN 20 MG PO TABS: 20.0000 mg | ORAL_TABLET | Freq: Every day | ORAL | 11 refills | Status: DC

## 2016-12-02 ENCOUNTER — Ambulatory Visit (HOSPITAL_BASED_OUTPATIENT_CLINIC_OR_DEPARTMENT_OTHER)
Admission: RE | Admit: 2016-12-02 | Discharge: 2016-12-02 | Disposition: A | Discharge status: Home / Self Care | Payer: Medicare Other | Source: Ambulatory Visit | Attending: Physician Assistant | Admitting: Physician Assistant

## 2016-12-02 DIAGNOSIS — R221 Localized swelling, mass and lump, neck: Secondary | ICD-10-CM

## 2016-12-02 DIAGNOSIS — H2513 Age-related nuclear cataract, bilateral: Secondary | ICD-10-CM | POA: Diagnosis not present

## 2016-12-02 DIAGNOSIS — E041 Nontoxic single thyroid nodule: Secondary | ICD-10-CM | POA: Diagnosis not present

## 2016-12-02 NOTE — Telephone Encounter (Signed)
Follow up     Pt verbalized that he is returning call about his medication

## 2016-12-03 ENCOUNTER — Encounter: Payer: Self-pay | Admitting: Cardiology

## 2016-12-03 NOTE — Telephone Encounter (Signed)
This encounter was created in error - please disregard.

## 2016-12-03 NOTE — Telephone Encounter (Signed)
F/u message ° °Pt returning RN call. Please call back to discuss  °

## 2016-12-03 NOTE — Telephone Encounter (Signed)
Patrick Roth at 12/03/2016 10:04 AM   Status: Signed    F/u message  Pt returning RN call. Please call back to discuss

## 2016-12-06 ENCOUNTER — Encounter: Payer: Self-pay | Admitting: Physician Assistant

## 2016-12-06 NOTE — Telephone Encounter (Signed)
Patient reports no symptoms w/ AFib.  States it is currently under control along with HRs.  He cannot afford Multaq and does not want to start any medication unless necessary. He also does not want to start a blood thinner - even with the risks.  States he has had too much go on recently and does not want to be on an anticoagulant. Moved follow up w/ Dr. [name] to 1/25 to discuss in more detail w/ physician - pt is agreeable to plan. Dr. [name] aware.

## 2016-12-14 ENCOUNTER — Ambulatory Visit (INDEPENDENT_AMBULATORY_CARE_PROVIDER_SITE_OTHER): Payer: Medicare Other | Admitting: Physician Assistant

## 2016-12-14 ENCOUNTER — Encounter: Payer: Self-pay | Admitting: Physician Assistant

## 2016-12-14 ENCOUNTER — Ambulatory Visit: Payer: Medicare Other | Admitting: Physician Assistant

## 2016-12-14 DIAGNOSIS — I10 Essential (primary) hypertension: Secondary | ICD-10-CM | POA: Diagnosis not present

## 2016-12-14 DIAGNOSIS — I4819 Other persistent atrial fibrillation: Secondary | ICD-10-CM

## 2016-12-14 DIAGNOSIS — I481 Persistent atrial fibrillation: Secondary | ICD-10-CM

## 2016-12-14 NOTE — Patient Instructions (Signed)
Please follow-up with Cardiology as scheduled. Please at lease continue the Aspirin daily as directed if you will not take the Xarelto or another blood thinner. Will defer further treatment to cardiology.  If you note any chest pain or shortness of breath, please call 911.   Continue chronic medications as directed.

## 2016-12-14 NOTE — Progress Notes (Signed)
Patient presents to clinic today for follow-up of hypertension and atrial fibrillation. At last visit HCTZ was added at low dose due to elevated BP. Patient states he did not take as directed. States he is not going to take any more medication. Has also not restarted his Xarelto to help reduce risk for clot, stroke, etc due to atrial fibrillation. States he will wait and address with his Cardiologist at appointment later this week. Patient denies chest pain, palpitations, lightheadedness, dizziness, vision changes or frequent headaches.  BP Readings from Last 3 Encounters:  12/14/16 (!) 146/84  11/30/16 (!) 152/80  10/11/16 (!) 142/80    Past Medical History:  Diagnosis Date  . Colon polyps   . History of chicken pox   . Hyperlipidemia   . Hypertension   . Internal hemorrhoids   . Sciatica   . Stroke [facility]) 2008    Current Outpatient Prescriptions on File Prior to Visit  Medication Sig Dispense Refill  . LYRICA 50 MG capsule TAKE ONE CAPSULE BY MOUTH TWICE DAILY 60 capsule 1  . metoprolol tartrate (LOPRESSOR) 25 MG tablet Take 25 mg by mouth 2 (two) times daily.   0  . rivaroxaban (XARELTO) 20 MG TABS tablet Take 1 tablet (20 mg total) by mouth daily with supper. 30 tablet 0   No current facility-administered medications on file prior to visit.     Allergies  Allergen Reactions  . Crestor [Rosuvastatin Calcium] Shortness Of Breath    Muscle cramps  . Spironolactone Shortness Of Breath  . Terazosin Shortness Of Breath, Palpitations and Other (See Comments)    Nerve pain  . Atenolol Other (See Comments)    Drowsiness and "flu sxs"  . Bystolic [Nebivolol Hcl] Other (See Comments)    GI issues  . Hydralazine Other (See Comments)    Joint swelling  . Tizanidine Other (See Comments)    Syncope, Elevated BP/pulse  . Amlodipine Other (See Comments)  . Clonidine Other (See Comments)  . Hydrocodone-Acetaminophen Other (See Comments)  . Levofloxacin Other (See Comments)  .  Losartan Other (See Comments)  . Maxzide [Hydrochlorothiazide W-Triamterene] Other (See Comments)    Does not tolerate potassium-sparing diuretics  . Olmesartan Other (See Comments)  . Tussionex Pennkinetic Er [Hydrocod Polst-Cpm Polst Er] Other (See Comments)    Other reaction(s): Unknown    Family History  Problem Relation Age of Onset  . Hypertension Father 81    Deceased  . Heart disease Father   . Lung cancer Mother 65    Deceased  . Healthy Sister     x3  . Healthy Brother     x4  . Heart disease Brother     #5  . Other Daughter     Alpha Thalassemia  . Lupus Daughter     #2-deceased  . Diabetes Neg Hx   . Heart attack Neg Hx   . Hyperlipidemia Neg Hx   . Sudden death Neg Hx     Social History   Social History  . Marital status: Married    Spouse name: N/A  . Number of children: 1  . Years of education: 55   Occupational History  . Retired    Social History Main Topics  . Smoking status: Never Smoker  . Smokeless tobacco: Never Used  . Alcohol use No  . Drug use: No  . Sexual activity: Not Asked   Other Topics Concern  . None   Social History Narrative   Fun: Merchandiser, retail  and build furniture.    Review of Systems - See HPI.  All other ROS are negative.  BP (!) 146/84   Pulse (!) 54   Temp 98.2 F (36.8 C) (Oral)   Resp 16   Ht _0  (1.727 m)   Wt 210 lb (95.3 kg)   SpO2 98%   BMI 31.93 kg/m   Physical Exam  Constitutional: He is oriented to person, place, and time and well-developed, well-nourished, and in no distress.  HENT:  Head: Normocephalic and atraumatic.  Eyes: Conjunctivae are normal.  Neck: Neck supple.  Cardiovascular: Normal rate, regular rhythm, normal heart sounds and intact distal pulses.   Pulmonary/Chest: Effort normal and breath sounds normal. No respiratory distress. He has no wheezes. He has no rales. He exhibits no tenderness.  Neurological: He is alert and oriented to person, place, and time.  Skin: Skin is warm  and dry. No rash noted.  Psychiatric: Affect normal.  Vitals reviewed.   Recent Results (from the past 2160 hour(s))  Basic Metabolic Panel (BMET)     Status: Abnormal   Collection Time: 10/25/16 11:40 AM  Result Value Ref Range   Sodium 140 135 - 146 mmol/L   Potassium 3.9 3.5 - 5.3 mmol/L   Chloride 102 98 - 110 mmol/L   CO2 32 (H) 20 - 31 mmol/L   Glucose, Bld 87 65 - 99 mg/dL   BUN 13 7 - 25 mg/dL   Creat 1.20 (H) 0.70 - 1.18 mg/dL    Comment:   For patients > or = 72 years of age: The upper reference limit for Creatinine is approximately 13% higher for people identified as African-American.      Calcium 9.3 8.6 - 10.3 mg/dL  CBC     Status: None   Collection Time: 10/25/16 11:40 AM  Result Value Ref Range   WBC 4.3 3.8 - 10.8 K/uL   RBC 5.33 4.20 - 5.80 MIL/uL   Hemoglobin 15.5 13.2 - 17.1 g/dL   HCT 46.9 38.5 - 50.0 %   MCV 88.0 80.0 - 100.0 fL   MCH 29.1 27.0 - 33.0 pg   MCHC 33.0 32.0 - 36.0 g/dL   RDW 15.0 11.0 - 15.0 %   Platelets 180 140 - 400 K/uL   MPV 10.4 7.5 - 12.5 fL  Comp Met (CMET)     Status: Abnormal   Collection Time: 11/30/16 12:00 PM  Result Value Ref Range   Sodium 143 135 - 145 mEq/L   Potassium 3.5 3.5 - 5.1 mEq/L   Chloride 103 96 - 112 mEq/L   CO2 35 (H) 19 - 32 mEq/L   Glucose, Bld 107 (H) 70 - 99 mg/dL   BUN 17 6 - 23 mg/dL   Creatinine, Ser 1.28 0.40 - 1.50 mg/dL   Total Bilirubin 1.3 (H) 0.2 - 1.2 mg/dL   Alkaline Phosphatase 64 39 - 117 U/L   AST 21 0 - 37 U/L   ALT 16 0 - 53 U/L   Total Protein 6.8 6.0 - 8.3 g/dL   Albumin 3.9 3.5 - 5.2 g/dL   Calcium 9.5 8.4 - 10.5 mg/dL   GFR 71.08 >60.00 mL/min  Lipid panel     Status: Abnormal   Collection Time: 11/30/16 12:00 PM  Result Value Ref Range   Cholesterol 220 (H) 0 - 200 mg/dL    Comment: ATP III Classification       Desirable:  < 200 mg/dL  Borderline High:  200 - 239 mg/dL          High:  > = 240 mg/dL   Triglycerides 102.0 0.0 - 149.0 mg/dL    Comment:  Normal:  <150 mg/dLBorderline High:  150 - 199 mg/dL   HDL 46.30 >39.00 mg/dL   VLDL 20.4 0.0 - 40.0 mg/dL   LDL Cholesterol 154 (H) 0 - 99 mg/dL   Total CHOL/HDL Ratio 5     Comment:                Men          Women1/2 Average Risk     3.4          3.3Average Risk          5.0          4.42X Average Risk          9.6          7.13X Average Risk          15.0          11.0                       NonHDL 174.12     Comment: NOTE:  Non-HDL goal should be 30 mg/dL higher than patient's LDL goal (i.e. LDL goal of < 70 mg/dL, would have non-HDL goal of < 100 mg/dL)    Assessment/Plan: Persistent atrial fibrillation (HCC) Regular rhythm on examination today. A fib has been paroxysmal. Still has not restarted Xarelto -- initially stopped because of cost but was given a month of medication in samples from the office -- now states he does not need medication despite discussion of risk of clotting. Will discuss with Cardiology at there visit. Encouraged patient to at least continue ASA for now.   Essential hypertension Varible. Fluctuates between 140s-150s/80s-90s. Patient refusing to allow [place] to treat further. He is to continue Metoprolol.   Unfortunately cannot adequately take care of a patient that refuses treatment and has history of self titration and discontinuation of medications.    Leeanne Rio, PA-C

## 2016-12-14 NOTE — Assessment & Plan Note (Signed)
Regular rhythm on examination today. A fib has been paroxysmal. Still has not restarted Xarelto -- initially stopped because of cost but was given a month of medication in samples from the office -- now states he does not need medication despite discussion of risk of clotting. Will discuss with Cardiology at there visit. Encouraged patient to at least continue ASA for now.

## 2016-12-14 NOTE — Assessment & Plan Note (Signed)
Varible. Fluctuates between 140s-150s/80s-90s. Patient refusing to allow [place] to treat further. He is to continue Metoprolol.   Unfortunately cannot adequately take care of a patient that refuses treatment and has history of self titration and discontinuation of medications.

## 2016-12-14 NOTE — Progress Notes (Signed)
Pre visit review using our clinic review tool, if applicable. No additional management support is needed unless otherwise documented below in the visit note. 

## 2016-12-16 ENCOUNTER — Encounter: Payer: Self-pay | Admitting: Cardiology

## 2016-12-16 ENCOUNTER — Ambulatory Visit (INDEPENDENT_AMBULATORY_CARE_PROVIDER_SITE_OTHER): Payer: Medicare Other | Admitting: Cardiology

## 2016-12-16 VITALS — BP 138/92 | HR 53 | Ht 68.0 in | Wt 211.0 lb

## 2016-12-16 DIAGNOSIS — I481 Persistent atrial fibrillation: Secondary | ICD-10-CM | POA: Diagnosis not present

## 2016-12-16 DIAGNOSIS — I4819 Other persistent atrial fibrillation: Secondary | ICD-10-CM

## 2016-12-16 NOTE — Patient Instructions (Signed)
Medication Instructions:  Your physician recommends that you continue on your current medications as directed. Please refer to the Current Medication list given to you today.  If you need a refill on your cardiac medications before your next appointment, please call your pharmacy.   Labwork: None ordered  Testing/Procedures: None ordered  Follow-Up: Your physician wants you to follow-up in: 6 months with Dr. Camnitz.  You will receive a reminder letter in the mail two months in advance. If you don't receive a letter, please call our office to schedule the follow-up appointment.  Thank you for choosing [facility]!!   Kairi Tufo, RN [phone]         

## 2016-12-16 NOTE — Progress Notes (Signed)
Electrophysiology Office Note   Date:  12/16/2016   ID:  Patrick Roth, DOB Jul 28, 1946, MRN [id number]  PCP:  Leeanne Rio, PA-C  Cardiologist:  [name] Primary Electrophysiologist:  Subrina Vecchiarelli Meredith Leeds, MD    Chief Complaint  Patient presents with  . Follow-up    Persistent AFib     History of Present Illness: Patrick Roth is a 71 y.o. male who presents today for electrophysiology evaluation.   Hx of HTN and multiple drug intolerances, HL, prior CVA. He was admitted in 7/17 with syncope felt to be 2/2 orthostatic hypotension. He was found to be in new onset AF with RVR. He has been asymptomatic. He was started on Metoprolol Tartrate 25 bid and Xarelto for anticoagulation. He says that at times, he gets weak and short of breath. He says that it feels like his legs are full of bricks. Prior to his diagnosis with atrial fibrillation, he went to his primary physician to see if this could be a heart abnormality, but his EKGs were normal sinus.    Today, he denies symptoms of palpitations, chest pain, shortness of breath, orthopnea, PND, lower extremity edema, claudication, dizziness, presyncope, syncope, bleeding, or neurologic sequela. The patient is tolerating medications without difficulties and is otherwise without complaint today. He has stopped most of his medications and has had no further episodes of atrial fibrillation. He is no longer taking his multiecho nor his Xarelto.   Past Medical History:  Diagnosis Date  . Colon polyps   . History of chicken pox   . Hyperlipidemia   . Hypertension   . Internal hemorrhoids   . Sciatica   . Stroke [facility]) 2008   Past Surgical History:  Procedure Laterality Date  . COLONOSCOPY  2011  . great toe surgery    . PROSTATE BIOPSY       Current Outpatient Prescriptions  Medication Sig Dispense Refill  . aspirin 81 MG tablet Take 81 mg by mouth daily.    Marland Kitchen LYRICA 50 MG capsule TAKE ONE CAPSULE BY MOUTH TWICE DAILY 60  capsule 1  . metoprolol tartrate (LOPRESSOR) 25 MG tablet Take 25 mg by mouth 2 (two) times daily.   0   No current facility-administered medications for this visit.     Allergies:   Crestor [rosuvastatin calcium]; Spironolactone; Terazosin; Atenolol; Bystolic [nebivolol hcl]; Hydralazine; Tizanidine; Amlodipine; Clonidine; Hydrocodone-acetaminophen; Levofloxacin; Losartan; Maxzide [hydrochlorothiazide w-triamterene]; Olmesartan; and Tussionex pennkinetic er [hydrocod polst-cpm polst er]   Social History:  The patient  reports that he has never smoked. He has never used smokeless tobacco. He reports that he does not drink alcohol or use drugs.   Family History:  The patient's family history includes Healthy in his brother and sister; Heart disease in his brother and father; Hypertension (age of onset: 64) in his father; Lung cancer (age of onset: 65) in his mother; Lupus in his daughter; Other in his daughter.    ROS:  Please see the history of present illness.   Otherwise, review of systems is positive for none.   All other systems are reviewed and negative.    PHYSICAL EXAM: VS:  BP (!) 138/92   Pulse (!) 53   Ht 5\' 8"  (1.727 m)   Wt 211 lb (95.7 kg)   BMI 32.08 kg/m  , BMI Body mass index is 32.08 kg/m. GEN: Well nourished, well developed, in no acute distress  HEENT: normal  Neck: no JVD, carotid bruits, or masses Cardiac: RRR; no murmurs,  rubs, or gallops,no edema  Respiratory:  clear to auscultation bilaterally, normal work of breathing GI: soft, nontender, nondistended, + BS MS: no deformity or atrophy  Skin: warm and dry Neuro:  Strength and sensation are intact Psych: euthymic mood, full affect  EKG:  EKG is ordered today. Personal review of the ekg ordered shows sinus rhythm, RBBB, LAFB  Recent Labs: 01/29/2016: Pro B Natriuretic peptide (BNP) 152.0 09/14/2016: TSH 0.67 10/25/2016: Hemoglobin 15.5; Platelets 180 11/30/2016: ALT 16; BUN 17; Creatinine, Ser 1.28;  Potassium 3.5; Sodium 143    Lipid Panel     Component Value Date/Time   CHOL 220 (H) 11/30/2016 1200   TRIG 102.0 11/30/2016 1200   HDL 46.30 11/30/2016 1200   CHOLHDL 5 11/30/2016 1200   VLDL 20.4 11/30/2016 1200   LDLCALC 154 (H) 11/30/2016 1200     Wt Readings from Last 3 Encounters:  12/16/16 211 lb (95.7 kg)  12/14/16 210 lb (95.3 kg)  11/30/16 211 lb (95.7 kg)      Other studies Reviewed: Additional studies/ records that were reviewed today include: TTE 10/01/16  Review of the above records today demonstrates:  - Left ventricle: The cavity size was normal. Systolic function was   normal. The estimated ejection fraction was in the range of 55%   to 60%. Wall motion was normal; there were no regional wall   motion abnormalities. Doppler parameters are consistent with a   reversible restrictive pattern, indicative of decreased left   ventricular diastolic compliance and/or increased left atrial   pressure (grade 3 diastolic dysfunction). Doppler parameters are   consistent with high ventricular filling pressure. - Aortic valve: Transvalvular velocity was within the normal range.   There was no stenosis. - Mitral valve: Transvalvular velocity was within the normal range.   There was no evidence for stenosis. There was mild regurgitation. - Right ventricle: The cavity size was mildly dilated. Wall   thickness was normal. Systolic function was normal. - Atrial septum: No defect or patent foramen ovale was identified   by color flow Doppler. - Tricuspid valve: There was mild regurgitation. - Pulmonary arteries: Systolic pressure was moderately to severely   increased. PA peak pressure: 57 mm Hg (S). - Pericardium, extracardiac: A trivial pericardial effusion was   identified.   ASSESSMENT AND PLAN:  1.  Paroxysmal atrial fibrillation: Currently is on metoprolol, but is stopped his multivitamins relative. I had a long discussion with him about the benefits of  anticoagulation and stroke prevention and atrial fibrillation. We discussed that his stroke risk is elevated at 4.8% per year. He says that he has seen multiple ads for lawsuits that advertised bleeds and death and that he would prefer to just take a baby aspirin. I told him that if he reconsiders to call us back and we Dinh Ayotte place him on a different anticoagulant.  This patients CHA2DS2-VASc Score and unadjusted Ischemic Stroke Rate (% per year) is equal to 4.8 % stroke rate/year from a score of 4  Above score calculated as 1 point each if present [CHF, HTN, DM, Vascular=MI/PAD/Aortic Plaque, Age if 65-74, or Male] Above score calculated as 2 points each if present [Age > 75, or Stroke/TIA/TE]   2. Hypertension: Mildly elevated today but well-controlled on other visits. We'll continue his current medications    Current medicines are reviewed at length with the patient today.   The patient does not have concerns regarding his medicines.  The following changes were made today:  Multaq  Labs/  tests ordered today include:  Orders Placed This Encounter  Procedures  . EKG 12-Lead     Disposition:   FU with Jago Carton 6 months  Signed, Hillarie Harrigan Meredith Leeds, MD  12/16/2016 2:09 PM     [facility] [address] [place] [address] [phone] (office) [phone] (fax)

## 2016-12-24 ENCOUNTER — Other Ambulatory Visit: Payer: Self-pay | Admitting: Physician Assistant

## 2016-12-24 ENCOUNTER — Telehealth: Payer: Self-pay | Admitting: Physician Assistant

## 2016-12-24 NOTE — Telephone Encounter (Signed)
Patient walked in needing refill of Chlorthalidone, a medication he noted 2 visits ago he was not longer taking due to side effects. At that time we switched to HCTZ but he never started taking which I am glad of as he would have been overmedicated.   Patient is constantly self-discontinuing mediation and altering doses without consulting [place] as his primary care providers. Is also non-compliant with Cardiology recommendations.  Unfortunately I am unable to establish a truly therapeutic relationship with patient.

## 2016-12-27 ENCOUNTER — Telehealth: Payer: Self-pay | Admitting: Physician Assistant

## 2016-12-27 DIAGNOSIS — G629 Polyneuropathy, unspecified: Secondary | ICD-10-CM

## 2016-12-27 NOTE — Telephone Encounter (Signed)
Pt is asking if he could change Rx lyrica to gabapentin, do to the cost being cheaper for him, [facility] on [address].

## 2016-12-27 NOTE — Telephone Encounter (Signed)
Ok to restart prior dose of Gabapentin if Lyrica is too expensive. Ok to send in 30 day supply with 1 refill.

## 2016-12-27 NOTE — Telephone Encounter (Signed)
Looks like patient was on medication on 05/12/16 and was d/c 11/26/16.

## 2016-12-28 MED ORDER — GABAPENTIN 100 MG PO CAPS: 100.0000 mg | ORAL_CAPSULE | Freq: Two times a day (BID) | ORAL | 1 refills | Status: DC

## 2016-12-28 NOTE — Addendum Note (Signed)
Addended by: Leonidas Romberg on: 12/28/2016 04:04 PM   Modules accepted: Orders

## 2016-12-28 NOTE — Telephone Encounter (Signed)
Advised patient if the Lyrica was too expensive Patrick Roth ok to restart the Gabapentin at 100 mg bid.  Advised patient if medication is not helping to notify the office and adjustments can be made to the medication dosage. He is agreeable.

## 2017-01-03 ENCOUNTER — Ambulatory Visit: Payer: Medicare Other | Admitting: Cardiovascular Disease

## 2017-01-06 ENCOUNTER — Other Ambulatory Visit: Payer: Self-pay | Admitting: Physician Assistant

## 2017-01-11 ENCOUNTER — Ambulatory Visit: Payer: Medicare Other | Admitting: Cardiology

## 2017-03-11 ENCOUNTER — Other Ambulatory Visit: Payer: Self-pay | Admitting: Physician Assistant

## 2017-03-11 DIAGNOSIS — G629 Polyneuropathy, unspecified: Secondary | ICD-10-CM

## 2017-03-14 ENCOUNTER — Telehealth: Payer: Self-pay | Admitting: Physician Assistant

## 2017-03-14 NOTE — Telephone Encounter (Signed)
Patient requesting refills of:  chlorthalidone (HYGROTON) 25 MG tablet gabapentin (NEURONTIN) 100 MG capsule  States pharmacy has not heard back from office.  I advised patient we did receive refill request on 03/11/17 but it had not been approved at this time. Patient states he is out of his bp medication and needs to get it filled asap.   Pharmacy: [facility] [address], [place] - [place] [address] [phone] (Phone) [phone] (Fax)

## 2017-03-14 NOTE — Telephone Encounter (Signed)
Advised patient we have refilled his medication for 1 month but did need a follow up appointment. He is agreeable and scheduled for 03/15/17

## 2017-03-15 ENCOUNTER — Ambulatory Visit (INDEPENDENT_AMBULATORY_CARE_PROVIDER_SITE_OTHER): Payer: Medicare Other | Admitting: Physician Assistant

## 2017-03-15 ENCOUNTER — Encounter: Payer: Self-pay | Admitting: Physician Assistant

## 2017-03-15 ENCOUNTER — Other Ambulatory Visit: Payer: Self-pay | Admitting: Physician Assistant

## 2017-03-15 VITALS — BP 134/82 | HR 54 | Temp 98.5°F | Resp 14 | Ht 68.0 in | Wt 213.0 lb

## 2017-03-15 DIAGNOSIS — I10 Essential (primary) hypertension: Secondary | ICD-10-CM | POA: Diagnosis not present

## 2017-03-15 LAB — COMPREHENSIVE METABOLIC PANEL
ALK PHOS: 64 U/L (ref 39–117)
ALT: 18 U/L (ref 0–53)
AST: 24 U/L (ref 0–37)
Albumin: 4.1 g/dL (ref 3.5–5.2)
BUN: 17 mg/dL (ref 6–23)
CALCIUM: 9.6 mg/dL (ref 8.4–10.5)
CO2: 36 mEq/L — ABNORMAL HIGH (ref 19–32)
Chloride: 99 mEq/L (ref 96–112)
Creatinine, Ser: 1.22 mg/dL (ref 0.40–1.50)
GFR: 75.07 mL/min (ref >60.00)
GLUCOSE: 79 mg/dL (ref 70–99)
POTASSIUM: 3.2 meq/L — AB (ref 3.5–5.1)
Sodium: 140 mEq/L (ref 135–145)
TOTAL PROTEIN: 7.1 g/dL (ref 6.0–8.3)
Total Bilirubin: 1.1 mg/dL (ref 0.2–1.2)

## 2017-03-15 NOTE — Progress Notes (Signed)
Pre visit review using our clinic review tool, if applicable. No additional management support is needed unless otherwise documented below in the visit note. 

## 2017-03-15 NOTE — Progress Notes (Signed)
Patient presents to clinic today for follow-up of hypertension and hyperlipidemia. Patient previously non-adherent with recommendations. Patient is currently on regimen of 81 mg ASA daily, Chlorthalidone 25 mg daily and Metoprolol tartrate 25 mg once daily. Patient denies chest pain, palpitations, lightheadedness, dizziness, vision changes or frequent headaches. Has been out of Chlorthalidone for a week.  BP Readings from Last 3 Encounters:  03/15/17 134/82  12/16/16 (!) 138/92  12/14/16 (!) 146/84    Past Medical History:  Diagnosis Date  . Colon polyps   . History of chicken pox   . Hyperlipidemia   . Hypertension   . Internal hemorrhoids   . Sciatica   . Stroke [facility]) 2008    Current Outpatient Prescriptions on File Prior to Visit  Medication Sig Dispense Refill  . aspirin 81 MG tablet Take 81 mg by mouth daily.    . chlorthalidone (HYGROTON) 25 MG tablet TAKE 1 TABLET BY MOUTH ONCE DAILY 30 tablet 0  . gabapentin (NEURONTIN) 100 MG capsule TAKE ONE CAPSULE BY MOUTH TWICE DAILY 60 capsule 0  . metoprolol tartrate (LOPRESSOR) 25 MG tablet Take 25 mg by mouth 2 (two) times daily. Take 1 tablet by mouth daily.  0   No current facility-administered medications on file prior to visit.     Allergies  Allergen Reactions  . Crestor [Rosuvastatin Calcium] Shortness Of Breath    Muscle cramps  . Spironolactone Shortness Of Breath  . Terazosin Shortness Of Breath, Palpitations and Other (See Comments)    Nerve pain  . Atenolol Other (See Comments)    Drowsiness and "flu sxs"  . Bystolic [Nebivolol Hcl] Other (See Comments)    GI issues  . Hydralazine Other (See Comments)    Joint swelling  . Tizanidine Other (See Comments)    Syncope, Elevated BP/pulse  . Amlodipine Other (See Comments)  . Clonidine Other (See Comments)  . Hydrocodone-Acetaminophen Other (See Comments)  . Levofloxacin Other (See Comments)  . Losartan Other (See Comments)  . Maxzide [Hydrochlorothiazide  W-Triamterene] Other (See Comments)    Does not tolerate potassium-sparing diuretics  . Olmesartan Other (See Comments)  . Tussionex Pennkinetic Er [Hydrocod Polst-Cpm Polst Er] Other (See Comments)    Other reaction(s): Unknown    Family History  Problem Relation Age of Onset  . Hypertension Father 45    Deceased  . Heart disease Father   . Lung cancer Mother 70    Deceased  . Healthy Sister     x3  . Healthy Brother     x4  . Heart disease Brother     #5  . Other Daughter     Alpha Thalassemia  . Lupus Daughter     #2-deceased  . Diabetes Neg Hx   . Heart attack Neg Hx   . Hyperlipidemia Neg Hx   . Sudden death Neg Hx     Social History   Social History  . Marital status: Married    Spouse name: N/A  . Number of children: 1  . Years of education: 63   Occupational History  . Retired    Social History Main Topics  . Smoking status: Never Smoker  . Smokeless tobacco: Never Used  . Alcohol use No  . Drug use: No  . Sexual activity: Not Asked   Other Topics Concern  . None   Social History Narrative   Fun: Play golf and build furniture.    Review of Systems - See HPI.  All other ROS  are negative.  BP 134/82   Pulse (!) 54   Temp 98.5 F (36.9 C) (Oral)   Resp 14   Ht 5\' 8"  (1.727 m)   Wt 213 lb (96.6 kg)   SpO2 98%   BMI 32.39 kg/m   Physical Exam  Constitutional: He is oriented to person, place, and time and well-developed, well-nourished, and in no distress.  HENT:  Head: Normocephalic and atraumatic.  Eyes: Conjunctivae are normal. Pupils are equal, round, and reactive to light.  Neck: Neck supple.  Cardiovascular: Intact distal pulses.   Pulmonary/Chest: Effort normal and breath sounds normal. No respiratory distress. He has no wheezes. He has no rales. He exhibits no tenderness.  Neurological: He is alert and oriented to person, place, and time.  Skin: Skin is warm and dry. No rash noted.  Psychiatric: Affect normal.  Vitals  reviewed.  Assessment/Plan: Essential hypertension BP stable out of Chlorthalidone. Pulse low but asymptomatic. Will decrease lopressor to 1/2 tablet daily. Will restart chlorthalidone at 1/2 tablet. Labs today. Follow-up scheduled.     Leeanne Rio, PA-C

## 2017-03-15 NOTE — Patient Instructions (Signed)
Please go to the lab. I will call with results  For the Metoprolol and Chlorthalidone -- please cute each tablet in half. Only take 1/2 tablet of each pill per day. Continue checking home blood pressure and heart rate daily and writing down. Follow-up in 1-2 weeks for reassessment.   Please take the Gabapentin -- 1 capsule (100 mg) three times daily.   I am setting you up with Dr. Ellene Route.  If you do not hear from their office within 1 week, please call us.

## 2017-03-17 NOTE — Assessment & Plan Note (Signed)
BP stable out of Chlorthalidone. Pulse low but asymptomatic. Will decrease lopressor to 1/2 tablet daily. Will restart chlorthalidone at 1/2 tablet. Labs today. Follow-up scheduled.

## 2017-03-29 ENCOUNTER — Ambulatory Visit (INDEPENDENT_AMBULATORY_CARE_PROVIDER_SITE_OTHER): Payer: Medicare Other | Admitting: Physician Assistant

## 2017-03-29 ENCOUNTER — Encounter: Payer: Self-pay | Admitting: Physician Assistant

## 2017-03-29 VITALS — BP 138/82 | HR 60 | Temp 98.3°F | Resp 16 | Ht 68.0 in | Wt 212.0 lb

## 2017-03-29 DIAGNOSIS — I10 Essential (primary) hypertension: Secondary | ICD-10-CM | POA: Diagnosis not present

## 2017-03-29 DIAGNOSIS — M5416 Radiculopathy, lumbar region: Secondary | ICD-10-CM

## 2017-03-29 NOTE — Patient Instructions (Signed)
Please keep a check on your BP and heart rate. Continue the Chlorthalidone at 1/2 tablet daily as directed. Hold the Metoprolol and call Cardiologist to schedule a follow-up so they can determine if they want to keep you off of this medication.   I have checked on referral to Neurosurgery. If you do not hear from them in a couple of days, call me.   Please increase night time Gabapentin to 300 mg. Continue same daytime dosing. Call me in 1 week to let me know how this is working.

## 2017-03-29 NOTE — Progress Notes (Signed)
Patient presents to clinic today for follow-up of hypertension. At last visit due to bradycardia, Metoprolol was decreased to 1/2 tablet daily. Patient endorses taking this and Chlorthalidone as directed each morning. Has not had medication yet today. Patient denies chest pain, palpitations, lightheadedness, dizziness, vision changes or frequent headaches. Endorses significantly improved energy and stamina with reduction in medication. Heart rate at last visit. Today only at 60 despite no medication in 24 hours. .   Past Medical History:  Diagnosis Date  . Colon polyps   . History of chicken pox   . Hyperlipidemia   . Hypertension   . Internal hemorrhoids   . Sciatica   . Stroke [facility]) 2008    Current Outpatient Prescriptions on File Prior to Visit  Medication Sig Dispense Refill  . aspirin 81 MG tablet Take 81 mg by mouth daily.    . chlorthalidone (HYGROTON) 25 MG tablet TAKE 1 TABLET BY MOUTH ONCE DAILY 30 tablet 0  . gabapentin (NEURONTIN) 100 MG capsule TAKE ONE CAPSULE BY MOUTH TWICE DAILY (Patient taking differently: TAKE ONE CAPSULE BY MOUTH THREE TIMES DAILY) 60 capsule 0  . metoprolol tartrate (LOPRESSOR) 25 MG tablet Take 12.5 mg by mouth daily.   0   No current facility-administered medications on file prior to visit.     Allergies  Allergen Reactions  . Crestor [Rosuvastatin Calcium] Shortness Of Breath    Muscle cramps  . Spironolactone Shortness Of Breath  . Terazosin Shortness Of Breath, Palpitations and Other (See Comments)    Nerve pain  . Atenolol Other (See Comments)    Drowsiness and "flu sxs"  . Bystolic [Nebivolol Hcl] Other (See Comments)    GI issues  . Hydralazine Other (See Comments)    Joint swelling  . Tizanidine Other (See Comments)    Syncope, Elevated BP/pulse  . Amlodipine Other (See Comments)  . Clonidine Other (See Comments)  . Hydrocodone-Acetaminophen Other (See Comments)  . Levofloxacin Other (See Comments)  . Losartan Other (See  Comments)  . Maxzide [Hydrochlorothiazide W-Triamterene] Other (See Comments)    Does not tolerate potassium-sparing diuretics  . Olmesartan Other (See Comments)  . Tussionex Pennkinetic Er [Hydrocod Polst-Cpm Polst Er] Other (See Comments)    Other reaction(s): Unknown    Family History  Problem Relation Age of Onset  . Hypertension Father 83    Deceased  . Heart disease Father   . Lung cancer Mother 45    Deceased  . Healthy Sister     x3  . Healthy Brother     x4  . Heart disease Brother     #5  . Other Daughter     Alpha Thalassemia  . Lupus Daughter     #2-deceased  . Diabetes Neg Hx   . Heart attack Neg Hx   . Hyperlipidemia Neg Hx   . Sudden death Neg Hx     Social History   Social History  . Marital status: Married    Spouse name: N/A  . Number of children: 1  . Years of education: 29   Occupational History  . Retired    Social History Main Topics  . Smoking status: Never Smoker  . Smokeless tobacco: Never Used  . Alcohol use No  . Drug use: No  . Sexual activity: Not Asked   Other Topics Concern  . None   Social History Narrative   Fun: Play golf and build furniture.    Review of Systems - See HPI.  All  other ROS are negative.  BP 138/82   Pulse 60   Temp 98.3 F (36.8 C) (Oral)   Resp 16   Ht 5\' 8"  (1.727 m)   Wt 212 lb (96.2 kg)   SpO2 98%   BMI 32.23 kg/m   Physical Exam  Constitutional: He is oriented to person, place, and time and well-developed, well-nourished, and in no distress.  HENT:  Head: Normocephalic and atraumatic.  Eyes: Conjunctivae are normal.  Neck: Neck supple.  Cardiovascular: Normal rate, regular rhythm, normal heart sounds and intact distal pulses.   Pulmonary/Chest: Effort normal and breath sounds normal. No respiratory distress. He has no wheezes. He has no rales. He exhibits no tenderness.  Lymphadenopathy:    He has no cervical adenopathy.  Neurological: He is alert and oriented to person, place, and  time.  Skin: Skin is warm and dry. No rash noted.  Psychiatric: Affect normal.  Vitals reviewed.  Recent Results (from the past 2160 hour(s))  Comprehensive metabolic panel     Status: Abnormal   Collection Time: 03/15/17  2:54 PM  Result Value Ref Range   Sodium 140 135 - 145 mEq/L   Potassium 3.2 (L) 3.5 - 5.1 mEq/L   Chloride 99 96 - 112 mEq/L   CO2 36 (H) 19 - 32 mEq/L   Glucose, Bld 79 70 - 99 mg/dL   BUN 17 6 - 23 mg/dL   Creatinine, Ser 1.22 0.40 - 1.50 mg/dL   Total Bilirubin 1.1 0.2 - 1.2 mg/dL   Alkaline Phosphatase 64 39 - 117 U/L   AST 24 0 - 37 U/L   ALT 18 0 - 53 U/L   Total Protein 7.1 6.0 - 8.3 g/dL   Albumin 4.1 3.5 - 5.2 g/dL   Calcium 9.6 8.4 - 10.5 mg/dL   GFR 75.07 >60.00 mL/min    Assessment/Plan: 1. Lumbar radiculopathy, chronic Referral replaced for patient. - Ambulatory referral to Neurosurgery  2. Essential hypertension Vitals improved from last visit. NSR on exam. No Toprol in over a day with stable BP and HR at 60. Recommend continue Chlorthalidone. Hold Metoprolol. Patient to schedule FU with Cardiology for further discussion of metoprolol.    Leeanne Rio, PA-C

## 2017-03-29 NOTE — Progress Notes (Signed)
Pre visit review using our clinic review tool, if applicable. No additional management support is needed unless otherwise documented below in the visit note. 

## 2017-04-05 ENCOUNTER — Other Ambulatory Visit: Payer: Self-pay | Admitting: Physician Assistant

## 2017-04-05 DIAGNOSIS — G629 Polyneuropathy, unspecified: Secondary | ICD-10-CM

## 2017-04-07 ENCOUNTER — Other Ambulatory Visit: Payer: Self-pay | Admitting: Physician Assistant

## 2017-04-07 DIAGNOSIS — G629 Polyneuropathy, unspecified: Secondary | ICD-10-CM

## 2017-04-19 ENCOUNTER — Telehealth: Payer: Self-pay | Admitting: Physician Assistant

## 2017-04-19 NOTE — Telephone Encounter (Signed)
Looks like patient was referred to [facility] but he decided against that practice due to location. The referral states that his referral was sent to [facility]. I am assuming it was sent to [facility] instead of a neurosurgical practice. This needs to be corrected and the patient contacted. Thank you.

## 2017-04-19 NOTE — Telephone Encounter (Signed)
Pt states that the referral to [facility] is not what he was wanting, pt states that they just want to run test on him that he has already had done and he is looking to have surgery to fix the problems he is having. Pt asking if their is someone we could send him to that would assist him in this.

## 2017-04-19 NOTE — Telephone Encounter (Signed)
Called and spoke to pt, referral has now been sent to [facility], awaiting appt.

## 2017-04-22 ENCOUNTER — Emergency Department (HOSPITAL_COMMUNITY)
Admission: EM | Admit: 2017-04-22 | Discharge: 2017-04-23 | Disposition: A | Discharge status: Home / Self Care | Payer: Medicare Other | Attending: Emergency Medicine | Admitting: Emergency Medicine

## 2017-04-22 ENCOUNTER — Telehealth: Payer: Self-pay | Admitting: Cardiology

## 2017-04-22 ENCOUNTER — Emergency Department (HOSPITAL_COMMUNITY): Payer: Medicare Other

## 2017-04-22 ENCOUNTER — Encounter (HOSPITAL_COMMUNITY): Payer: Self-pay | Admitting: Emergency Medicine

## 2017-04-22 DIAGNOSIS — R000 Tachycardia, unspecified: Secondary | ICD-10-CM | POA: Diagnosis not present

## 2017-04-22 DIAGNOSIS — I483 Typical atrial flutter: Secondary | ICD-10-CM

## 2017-04-22 DIAGNOSIS — I10 Essential (primary) hypertension: Secondary | ICD-10-CM | POA: Diagnosis not present

## 2017-04-22 DIAGNOSIS — Z79899 Other long term (current) drug therapy: Secondary | ICD-10-CM | POA: Insufficient documentation

## 2017-04-22 DIAGNOSIS — Z7982 Long term (current) use of aspirin: Secondary | ICD-10-CM | POA: Insufficient documentation

## 2017-04-22 DIAGNOSIS — I252 Old myocardial infarction: Secondary | ICD-10-CM | POA: Insufficient documentation

## 2017-04-22 LAB — BASIC METABOLIC PANEL
ANION GAP: 9 (ref 5–15)
BUN: 20 mg/dL (ref 6–20)
CHLORIDE: 105 mmol/L (ref 101–111)
CO2: 27 mmol/L (ref 22–32)
Calcium: 8.9 mg/dL (ref 8.9–10.3)
Creatinine, Ser: 1.48 mg/dL — ABNORMAL HIGH (ref 0.61–1.24)
GFR calc Af Amer: 53 mL/min — ABNORMAL LOW (ref >60)
GFR, EST NON AFRICAN AMERICAN: 46 mL/min — AB (ref >60)
Glucose, Bld: 121 mg/dL — ABNORMAL HIGH (ref 65–99)
POTASSIUM: 3.4 mmol/L — AB (ref 3.5–5.1)
SODIUM: 141 mmol/L (ref 135–145)

## 2017-04-22 LAB — CBC
HEMATOCRIT: 49.5 % (ref 39.0–52.0)
HEMOGLOBIN: 16.8 g/dL (ref 13.0–17.0)
MCH: 28.6 pg (ref 26.0–34.0)
MCHC: 33.9 g/dL (ref 30.0–36.0)
MCV: 84.3 fL (ref 78.0–100.0)
Platelets: 145 10*3/uL — ABNORMAL LOW (ref 150–400)
RBC: 5.87 MIL/uL — ABNORMAL HIGH (ref 4.22–5.81)
RDW: 13.7 % (ref 11.5–15.5)
WBC: 4.3 10*3/uL (ref 4.0–10.5)

## 2017-04-22 LAB — I-STAT TROPONIN, ED: Troponin i, poc: 0.04 ng/mL (ref 0.00–0.08)

## 2017-04-22 MED ORDER — SODIUM CHLORIDE 0.9 % IV BOLUS (SEPSIS)
500.0000 mL | Freq: Once | INTRAVENOUS | Status: AC
  Administered 2017-04-22: 500 mL via INTRAVENOUS

## 2017-04-22 MED ORDER — METOPROLOL TARTRATE 5 MG/5ML IV SOLN
5.0000 mg | INTRAVENOUS | Status: DC | PRN
  Administered 2017-04-22 – 2017-04-23 (×3): 5 mg via INTRAVENOUS
  Filled 2017-04-22 (×3): qty 5

## 2017-04-22 NOTE — Telephone Encounter (Signed)
Spoke with patient who states he has been having SOB since he started working on a Programme researcher, broadcasting/film/video job at a particular house. He states he thought he was sensitive to something in the house but then he realized that he was doing a lot of squatting and bending and the bone spurs on his vertebrae are pushing on nerves causing his HR to be elevated. I advised that the BP reading he gave to the operator does not appear correct because of the close proximity of the numbers. I advised that the HR reading could be correct but if his HR is irregular that it would cause an incorrect BP reading. He does not know if he is in a fib currently.  He states he is taking the metoprolol 12.5 mg as needed due to advice of PCP, Elyn Aquas, PA to hold metoprolol due to low heart rate (60 bpm). He states his HR and BP usually normalize after he has a bowel movement but he does not currently feel like he needs to move his bowels. I advised him to continue to monitor and call back with questions or concerns. I scheduled his 6 month f/u appointment with Dr. [name] for July. He thanked me for the call.

## 2017-04-22 NOTE — ED Triage Notes (Signed)
Pt states that the day before yesterday when he was working outside, he got short of breath and noticed that his heart rate was high. Tachycardic yesterday also in the 120s-130s. Continued today. Asymptomatic until exertion, then SOB.

## 2017-04-22 NOTE — Telephone Encounter (Signed)
Pt c/o BP issue: STAT if pt c/o blurred vision, one-sided weakness or slurred speech  1. What are your last 5 BP readings? 111/99 and pulse 124  2. Are you having any other symptoms (ex. Dizziness, headache, blurred vision, passed out)? no  3. What is your BP issue? Elevated BP

## 2017-04-23 LAB — RAPID URINE DRUG SCREEN, HOSP PERFORMED
Amphetamines: NOT DETECTED
Barbiturates: NOT DETECTED
Benzodiazepines: NOT DETECTED
COCAINE: NOT DETECTED
OPIATES: NOT DETECTED
Tetrahydrocannabinol: NOT DETECTED

## 2017-04-23 LAB — TSH: TSH: 0.998 u[IU]/mL (ref 0.350–4.500)

## 2017-04-23 LAB — D-DIMER, QUANTITATIVE: D-Dimer, Quant: 0.44 ug/mL-FEU (ref 0.00–0.50)

## 2017-04-23 MED ORDER — DILTIAZEM LOAD VIA INFUSION
10.0000 mg | Freq: Once | INTRAVENOUS | Status: AC
  Administered 2017-04-23: 10 mg via INTRAVENOUS
  Filled 2017-04-23: qty 10

## 2017-04-23 MED ORDER — METOPROLOL TARTRATE 25 MG PO TABS: 12.5000 mg | ORAL_TABLET | Freq: Two times a day (BID) | ORAL | 0 refills | Status: DC

## 2017-04-23 MED ORDER — POTASSIUM CHLORIDE CRYS ER 20 MEQ PO TBCR
20.0000 meq | EXTENDED_RELEASE_TABLET | Freq: Once | ORAL | Status: AC
  Administered 2017-04-23: 20 meq via ORAL
  Filled 2017-04-23: qty 1

## 2017-04-23 MED ORDER — DILTIAZEM HCL-DEXTROSE 100-5 MG/100ML-% IV SOLN (PREMIX)
5.0000 mg/h | INTRAVENOUS | Status: DC
  Administered 2017-04-23: 5 mg/h via INTRAVENOUS
  Filled 2017-04-23: qty 100

## 2017-04-23 NOTE — ED Provider Notes (Signed)
01: 3 0-at this time the heart rate is 63, irregular, and indicates as atrial fibrillation on the cardiac monitor.  EKG, done at 01: 08 has heart rate 91, baseline artifact, but appears to have atrial flutter waves.  Patient has been started on Cardizem drip, will discontinue, and monitor.   Patient Vitals for the past 24 hrs:  BP Temp Temp src Pulse Resp SpO2 Height Weight  04/23/17 0200 (!) 142/100 - - 63 17 100 % - -  04/23/17 0130 (!) 145/101 - - 63 19 100 % - -  04/23/17 0101 (!) 138/97 - - (!) 59 20 100 % - -  04/23/17 0030 (!) 145/114 - - (!) 128 20 100 % - -  04/23/17 0000 (!) 156/120 - - (!) 125 20 98 % - -  04/22/17 2330 (!) 155/114 - - (!) 126 20 100 % - -  04/22/17 2300 (!) 158/117 - - (!) 124 (!) 21 98 % - -  04/22/17 2154 (!) 156/120 - - - - - - -  04/22/17 2130 (!) 159/121 - - (!) 132 16 100 % - -  04/22/17 2105 - - - - - - 5' 8.5" (1.74 m) 95.3 kg (210 lb)  04/22/17 2104 (!) 164/130 97.9 F (36.6 C) Oral (!) 132 20 99 % - -    EKG Interpretation  Date/Time:  Saturday April 23 2017 01:46:45 EDT Ventricular Rate:  64 PR Interval:    QRS Duration: 193 QT Interval:  437 QTC Calculation: 451 R Axis:   -69 Text Interpretation:  Atrial flutter Short PR interval Right bundle branch block Since last tracing of earlier today rate slower Confirmed by Daleen Bo [phone]) on 04/23/2017 2:03:08 AM        EKG Interpretation  Date/Time:  Saturday April 23 2017 01:46:45 EDT Ventricular Rate:  64 PR Interval:    QRS Duration: 193 QT Interval:  437 QTC Calculation: 451 R Axis:   -69 Text Interpretation:  Atrial flutter Short PR interval Right bundle branch block Since last tracing of earlier today rate slower Confirmed by Daleen Bo [phone]) on 04/23/2017 2:03:08 AM       CHA2DS2/VAS Stroke Risk Points      2 >= 2 Points: High Risk  1 - 1.99 Points: Medium Risk  0 Points: Low Risk    The previous score was 1 on 12/14/2016.:  Change: No        Details    Note:  External data might be a factor in metrics not marked with    Points Metrics   This score determines the patient's risk of having a stroke if the  patient has atrial fibrillation.       0 Has Congestive Heart Failure:  No   0 Has Vascular Disease:  No   1 Has Hypertension:  Yes   1 Age:  71   0 Has Diabetes:  No   0 Had Stroke:  No Had TIA:  No Had thromboembolism:  No   0 Male:  No            2:56 AM Reevaluation with update and discussion. After initial assessment and treatment, an updated evaluation reveals he feels comfortable at this time and was able to ambulate without symptoms.  Currently, heart rate is 64.  Blood pressure is reassuring.Daleen Bo L      Diagnoses that have been ruled out:  None  Diagnoses that are still under consideration:  None  Final diagnoses:  Tachycardia  Typical atrial flutter [facility])   Medical decision-making: Shortness of breath with palpitation, and tachycardia.  Heart rate improved, with treatment in the emergency department.  Patient has a history of atrial fibrillation, with a chads score of 1.  He is advised to follow-up with his cardiologist in 3 or 4 days and his PCP in 1 week for further evaluation and treatment.  In the meantime will increase metoprolol to 12.5 mg twice daily, for rate control.  Nursing Notes Reviewed/ Care Coordinated Applicable Imaging Reviewed Interpretation of Laboratory Data incorporated into ED treatment  The patient appears reasonably screened and/or stabilized for discharge and I doubt any other medical condition or other [facility] requiring further screening, evaluation, or treatment in the ED at this time prior to discharge.  Plan: Home Medications-continue usual medication and increase metoprolol; Home Treatments-rest, fluids; return here if the recommended treatment, does not improve the symptoms; Recommended follow up-PCP, and cardiology this week for follow-up      Daleen Bo, MD 04/23/17  [phone]

## 2017-04-23 NOTE — Discharge Instructions (Signed)
We are increasing her metoprolol dosing to 12.5 mg twice a day.  Call your cardiologist for a follow-up appointment in 3 or 4 days.  Make sure that you are getting plenty of rest, eating regularly and drinking a lot of fluids.  Return here, if needed, for problems.

## 2017-04-23 NOTE — ED Provider Notes (Signed)
[place] DEPT Provider Note   CSN: [id number] Arrival date & time: 04/22/17  2029     History   Chief Complaint Chief Complaint  Patient presents with  . Tachycardia    HPI DAEL HOWLAND is a 71 y.o. male. Chief complaint is tachycardia, shortness of breath, and leg weakness.  HPI:  Mr. Maia Petties reports no history of atrial fibrillation. However, he was noted to be in a symptomatic ventral fibrillation last fall and had cardiology follow-up. He was originally on Multaq, and Eliquis. He eventually had conversion. He declined L Quist despite a chads score of 4. His put on metoprolol and aspirin since that time.  He reports intermittent feelings of "strange feelings in my neck" since it time to make it feel like his heart rate goes fast. It was working on some cabinets endorses a previous incision position using a drill. His neck became sore. When stood up and walked across his legs felt heavy. He states he doesn't feel short of breath "almost". No chest pain. Does not subjectively feel tachypalpitations. He checked his blood pressure which she states was "high" and his pulse was 1:30. He presents here.  Past Medical History:  Diagnosis Date  . Colon polyps   . History of chicken pox   . Hyperlipidemia   . Hypertension   . Internal hemorrhoids   . Sciatica   . Stroke [facility]) 2008    Patient Active Problem List   Diagnosis Date Noted  . Lumbar radiculopathy, chronic 03/29/2017  . Persistent atrial fibrillation ([place]) 09/14/2016  . Faintness 06/20/2016  . Hyperlipemia 02/13/2016  . Essential hypertension 05/27/2015  . Mallet deformity of fourth finger, right 07/11/2013    Past Surgical History:  Procedure Laterality Date  . COLONOSCOPY  2011  . great toe surgery    . PROSTATE BIOPSY         Home Medications    Prior to Admission medications   Medication Sig Start Date End Date Taking? Authorizing Provider  aspirin 81 MG tablet Take 81 mg by mouth daily.   Yes  [provider]  chlorthalidone (HYGROTON) 25 MG tablet Take 0.5 tablets (12.5 mg total) by mouth daily. 04/07/17  Yes Brunetta Jeans, PA-C  gabapentin (NEURONTIN) 100 MG capsule Take 1 capsule in the morning, 1 capsule in the afternoon and 3 capsules in the evening Patient taking differently: Take 100-300 mg by mouth 2 (two) times daily. Take 1 capsule in the morning and 3 capsules in the evening 04/05/17  Yes Brunetta Jeans, PA-C  metoprolol tartrate (LOPRESSOR) 25 MG tablet Take 12.5 mg by mouth every evening.  09/14/16  Yes [provider]    Family History Family History  Problem Relation Age of Onset  . Hypertension Father 81       Deceased  . Heart disease Father   . Lung cancer Mother 45       Deceased  . Healthy Sister        x3  . Healthy Brother        x4  . Heart disease Brother        #5  . Other Daughter        Alpha Thalassemia  . Lupus Daughter        #2-deceased  . Diabetes Neg Hx   . Heart attack Neg Hx   . Hyperlipidemia Neg Hx   . Sudden death Neg Hx     Social History Social History  Substance Use Topics  .  Smoking status: Never Smoker  . Smokeless tobacco: Never Used  . Alcohol use No     Allergies   Crestor [rosuvastatin calcium]; Spironolactone; Terazosin; Atenolol; Bystolic [nebivolol hcl]; Hydralazine; Tizanidine; Amlodipine; Clonidine; Hydrocodone-acetaminophen; Levofloxacin; Losartan; Maxzide [hydrochlorothiazide w-triamterene]; Olmesartan; and Tussionex pennkinetic er [hydrocod polst-cpm polst er]   Review of Systems Review of Systems  Constitutional: Negative for appetite change, chills, diaphoresis, fatigue and fever.  HENT: Negative for mouth sores, sore throat and trouble swallowing.   Eyes: Negative for visual disturbance.  Respiratory: Negative for cough, chest tightness, shortness of breath and wheezing.   Cardiovascular: Negative for chest pain.  Gastrointestinal: Negative for abdominal distention, abdominal  pain, diarrhea, nausea and vomiting.  Endocrine: Negative for polydipsia, polyphagia and polyuria.  Genitourinary: Negative for dysuria, frequency and hematuria.  Musculoskeletal: Positive for neck pain. Negative for gait problem.       Leg weakness  Skin: Negative for color change, pallor and rash.  Neurological: Positive for weakness. Negative for dizziness, syncope, light-headedness and headaches.  Hematological: Does not bruise/bleed easily.  Psychiatric/Behavioral: Negative for behavioral problems and confusion.     Physical Exam Updated Vital Signs BP (!) 145/114   Pulse (!) 128   Temp 97.9 F (36.6 C) (Oral)   Resp 20   Ht 5' 8.5" (1.74 m)   Wt 95.3 kg (210 lb)   SpO2 100%   BMI 31.47 kg/m   Physical Exam  Constitutional: He is oriented to person, place, and time. He appears well-developed and well-nourished. No distress.  Awake and alert. Not distressed. Intermittent sleeping.  HENT:  Head: Normocephalic.  Eyes: Conjunctivae are normal. Pupils are equal, round, and reactive to light. No scleral icterus.  Neck: Normal range of motion. Neck supple. No thyromegaly present.  Cardiovascular: Exam reveals no gallop and no friction rub.   No murmur heard. Bundle branch block. Tachycardic. Regular. Sinus based.  Pulmonary/Chest: Effort normal and breath sounds normal. No respiratory distress. He has no wheezes. He has no rales.  Abdominal: Soft. Bowel sounds are normal. He exhibits no distension. There is no tenderness. There is no rebound.  Musculoskeletal: Normal range of motion.  Neurological: He is alert and oriented to person, place, and time.  Skin: Skin is warm and dry. No rash noted.  Psychiatric: He has a normal mood and affect. His behavior is normal.     ED Treatments / Results  Labs (all labs ordered are listed, but only abnormal results are displayed) Labs Reviewed  BASIC METABOLIC PANEL - Abnormal; Notable for the following:       Result Value    Potassium 3.4 (*)    Glucose, Bld 121 (*)    Creatinine, Ser 1.48 (*)    GFR calc non Af Amer 46 (*)    GFR calc Af Amer 53 (*)    All other components within normal limits  CBC - Abnormal; Notable for the following:    RBC 5.87 (*)    Platelets 145 (*)    All other components within normal limits  TSH  D-DIMER, QUANTITATIVE (NOT AT [facility])  I-STAT TROPOININ, ED    EKG  EKG Interpretation  Date/Time:  Friday April 22 2017 21:13:37 EDT Ventricular Rate:  131 PR Interval:    QRS Duration: 142 QT Interval:  357 QTC Calculation: 528 R Axis:   -80 Text Interpretation:  Sinus or ectopic atrial tachycardia Right bundle branch block Inferior infarct, old Confirmed by Tanna Furry [phone]) on 04/22/2017 9:50:32 PM  Radiology Dg Chest 2 View  Result Date: 04/22/2017 CLINICAL DATA:  Tachycardia EXAM: CHEST  2 VIEW COMPARISON:  06/20/2016 FINDINGS: The heart size and mediastinal contours are within normal limits. Both lungs are clear. The visualized skeletal structures are unremarkable. IMPRESSION: No active cardiopulmonary disease. Electronically Signed   By: Ashley Royalty M.D.   On: 04/22/2017 22:27    Procedures Procedures (including critical care time)  Medications Ordered in ED Medications  metoprolol tartrate (LOPRESSOR) injection 5 mg (5 mg Intravenous Given 04/23/17 0013)  diltiazem (CARDIZEM) 1 mg/mL load via infusion 10 mg (not administered)    And  diltiazem (CARDIZEM) 100 mg in dextrose 5% 168mL (1 mg/mL) infusion (not administered)  sodium chloride 0.9 % bolus 500 mL (0 mLs Intravenous Stopped 04/22/17 2302)     Initial Impression / Assessment and Plan / ED Course  I have reviewed the triage vital signs and the nursing notes.  Pertinent labs & imaging results that were available during my care of the patient were reviewed by me and considered in my medical decision making (see chart for details).    In review of this patient's chart reports a feeling of shortness of  breath and lightheadedness with previous episodes of A. fib. Today's clearly regular and sinus. Given IV metoprolol 3 with 50 mg total was no change in rate or rhythm. TSH and d-dimer are pending. Will start Cardizem for rate blood pressure control.  CRITICAL CARE Performed by: Tanna Furry JOSEPH   Total critical care time: 30 minutes  Critical care time was exclusive of separately billable procedures and treating other patients.  Critical care was necessary to treat or prevent imminent or life-threatening deterioration.  Critical care was time spent personally by me on the following activities: development of treatment plan with patient and/or surrogate as well as nursing, discussions with consultants, evaluation of patient's response to treatment, examination of patient, obtaining history from patient or surrogate, ordering and performing treatments and interventions, ordering and review of laboratory studies, ordering and review of radiographic studies, pulse oximetry and re-evaluation of patient's condition.   Dr. Eulis Foster will assume patient's care.  Final Clinical Impressions(s) / ED Diagnoses   Final diagnoses:  Tachycardia    New Prescriptions New Prescriptions   No medications on file     Tanna Furry, MD 04/23/17 [phone]

## 2017-04-25 ENCOUNTER — Telehealth: Payer: Self-pay | Admitting: Cardiology

## 2017-04-25 ENCOUNTER — Encounter (HOSPITAL_COMMUNITY): Payer: Self-pay | Admitting: Nurse Practitioner

## 2017-04-25 ENCOUNTER — Ambulatory Visit (HOSPITAL_COMMUNITY)
Admission: RE | Admit: 2017-04-25 | Discharge: 2017-04-25 | Disposition: A | Discharge status: Home / Self Care | Payer: Medicare Other | Source: Ambulatory Visit | Attending: Nurse Practitioner | Admitting: Nurse Practitioner

## 2017-04-25 VITALS — BP 126/104 | HR 134 | Ht 68.5 in | Wt 214.0 lb

## 2017-04-25 DIAGNOSIS — Z888 Allergy status to other drugs, medicaments and biological substances status: Secondary | ICD-10-CM | POA: Insufficient documentation

## 2017-04-25 DIAGNOSIS — Z885 Allergy status to narcotic agent status: Secondary | ICD-10-CM | POA: Insufficient documentation

## 2017-04-25 DIAGNOSIS — I10 Essential (primary) hypertension: Secondary | ICD-10-CM | POA: Insufficient documentation

## 2017-04-25 DIAGNOSIS — I4892 Unspecified atrial flutter: Secondary | ICD-10-CM | POA: Diagnosis not present

## 2017-04-25 DIAGNOSIS — Z9114 Patient's other noncompliance with medication regimen: Secondary | ICD-10-CM | POA: Insufficient documentation

## 2017-04-25 DIAGNOSIS — Z8673 Personal history of transient ischemic attack (TIA), and cerebral infarction without residual deficits: Secondary | ICD-10-CM | POA: Diagnosis not present

## 2017-04-25 DIAGNOSIS — Z881 Allergy status to other antibiotic agents status: Secondary | ICD-10-CM | POA: Insufficient documentation

## 2017-04-25 DIAGNOSIS — E785 Hyperlipidemia, unspecified: Secondary | ICD-10-CM | POA: Diagnosis not present

## 2017-04-25 DIAGNOSIS — I4891 Unspecified atrial fibrillation: Secondary | ICD-10-CM | POA: Diagnosis present

## 2017-04-25 DIAGNOSIS — Z7982 Long term (current) use of aspirin: Secondary | ICD-10-CM | POA: Diagnosis not present

## 2017-04-25 DIAGNOSIS — Z8249 Family history of ischemic heart disease and other diseases of the circulatory system: Secondary | ICD-10-CM | POA: Diagnosis not present

## 2017-04-25 DIAGNOSIS — Z801 Family history of malignant neoplasm of trachea, bronchus and lung: Secondary | ICD-10-CM | POA: Insufficient documentation

## 2017-04-25 DIAGNOSIS — Z79899 Other long term (current) drug therapy: Secondary | ICD-10-CM | POA: Insufficient documentation

## 2017-04-25 MED ORDER — METOPROLOL TARTRATE 25 MG PO TABS: 37.5000 mg | ORAL_TABLET | Freq: Two times a day (BID) | ORAL | 0 refills | Status: DC

## 2017-04-25 MED ORDER — APIXABAN 5 MG PO TABS: 5.0000 mg | ORAL_TABLET | Freq: Two times a day (BID) | ORAL | 0 refills | Status: DC

## 2017-04-25 NOTE — Telephone Encounter (Signed)
Returned call to patient-patient reports he was in the ER over the weekend with his HR high, states he was there for 5 hours before they were able to get his HR controlled.  Was discharged home with instructions to follow up with cardiology this week.  Reports continued issues with BP and HR fluctuating.  Reports SOB only when sitting or resting, reports SOB is better when up moving around, patient contributes this to a "spine issue" as there is pressure on his spine when sitting.    Reports BP 124/100 HR 119 last PM.  Patient took BP and HR while on the phone 148/116 HR 130, denies SOB, CP, dizziness, lightheadedness, palpitations at this time.  Reports compliance with metoprolol 12.5mg  BID.  Patient on ASA as denied [place] previously.   Patient of Dr. [name]/Dr. [name], Hx of persistent Afib.  Called Stacy in Afib clinic-ok to add 2pm in Afib Clinic with Roderic Palau NP.    Patient given instructions, location, and phone number.  Verbalized understanding.  Advised to call back with any questions or concerns.

## 2017-04-25 NOTE — Patient Instructions (Signed)
Your physician has recommended you make the following change in your medication:  1)Increase metoprolol to 37.5mg  twice a day (1 and 1/2 tablets of the 25mg  tablet)  2)Start Eliquis 5mg  twice a day

## 2017-04-25 NOTE — Telephone Encounter (Signed)
Mr. Hannen is calling because he went to E/R on Friday night and they told him to get an appt with his heart care provider . I have scheduled him for Wednesday , but he is wanting to speak with a nurse about his blood pressure not regulating itself. His blood pressure is running between 114/124 everyday . Please call .  Thanks

## 2017-04-26 NOTE — Progress Notes (Signed)
Cardiology Office Note Date:  04/27/2017  Patient ID:  Patrick Roth, Patrick Roth 09-10-1946, MRN [id number] PCP:  Brunetta Jeans, PA-C  Cardiologist:  Dr. Acie Fredrickson Electrophysiologist: Dr. [name]   Chief Complaint: HTN  History of Present Illness: Patrick Roth is a 71 y.o. male with history of persistent AFib, HTN, CVA,  HLD, syncope attributed to orthostasis in 2017.  He comes in today to be seen for Dr. [name], last seen by him in Jan 2018, at that visit the patient had stopped most of his medicines including his Xarelto and Multaq, at that visit despite discussion on stroke risk the patient declined a/c given warnings from law firm ads on TV, at that time was in [place].  More recently he had a visit with PMD office there was found to have asymptomatic bradycardia with HR 54 and his metoprolol was reduced  To 1/2 tab (12.81md daily) and chlorthalidone added for BP.  (PMD notes hx of medicine noncompliance), at f/u visit in May lopressor was discontinued with report of HR 60.  04/23/17 had ER visit with palpitations found in AF w/RVR ultimately treated with dilt gtt, noting as well marked HTN, discharged with lopressor 12.5mg  BID rates reported 60s.  04/25/17 via the AF clinic, increased to 37.5mg  BID and add Eliquis 5mg  BID.  04/22/17 -04/23/17  LABS K+ 3.4 BUN/Creat 20/1.48 poc Trop 0.04 WBC 4.3 H/H 16/49 Plts 145 TSH 0.44 Tox neg  He is feeling well.  He denies overt palpitations and says he can tell his heart is racing when he can feel it thumping on the left side or when supine he is able to tell about what his HR is.  He finds a direct correlation to fast HR and his severe neck pain.  When he is in pain his HR goes up, but usually is 50's-60's.  He denies any CP or SOB, no dizziness, near syncope or syncope.  He is very reluctant to consider increasing his medicine and say yesterday had a good day with HR 70's.  Past Medical History:  Diagnosis Date  . Colon polyps   . History of chicken pox     . Hyperlipidemia   . Hypertension   . Internal hemorrhoids   . Sciatica   . Stroke [facility]) 2008    Past Surgical History:  Procedure Laterality Date  . COLONOSCOPY  2011  . great toe surgery    . PROSTATE BIOPSY      Current Outpatient Prescriptions  Medication Sig Dispense Refill  . apixaban (ELIQUIS) 5 MG TABS tablet Take 1 tablet (5 mg total) by mouth 2 (two) times daily. 60 tablet 0  . aspirin 81 MG tablet Take 81 mg by mouth daily.    . chlorthalidone (HYGROTON) 25 MG tablet Take 0.5 tablets (12.5 mg total) by mouth daily. 30 tablet 1  . gabapentin (NEURONTIN) 100 MG capsule Take 1 capsule in the morning, 1 capsule in the afternoon and 3 capsules in the evening (Patient taking differently: Take 100-300 mg by mouth 2 (two) times daily. Take 1 capsule in the morning and 3 capsules in the evening) 150 capsule 3  . metoprolol tartrate (LOPRESSOR) 25 MG tablet Take 1.5 tablets (37.5 mg total) by mouth 2 (two) times daily. 30 tablet 0   No current facility-administered medications for this visit.     Allergies:   Crestor [rosuvastatin calcium]; Spironolactone; Terazosin; Atenolol; Bystolic [nebivolol hcl]; Hydralazine; Tizanidine; Amlodipine; Clonidine; Hydrocodone-acetaminophen; Levofloxacin; Losartan; Maxzide [hydrochlorothiazide w-triamterene]; Olmesartan; and Tussionex  pennkinetic er [hydrocod polst-cpm polst er]   Social History:  The patient  reports that he has never smoked. He has never used smokeless tobacco. He reports that he does not drink alcohol or use drugs.   Family History:  The patient's family history includes Healthy in his brother and sister; Heart disease in his brother and father; Hypertension (age of onset: 53) in his father; Lung cancer (age of onset: 21) in his mother; Lupus in his daughter; Other in his daughter.  ROS:  Please see the history of present illness.  All other systems are reviewed and otherwise negative.   PHYSICAL EXAM:  VS:  BP (!) 135/103    Pulse (!) 138   Ht 5' 8.5" (1.74 m)   Wt 213 lb (96.6 kg)   BMI 31.92 kg/m  BMI: Body mass index is 31.92 kg/m. Well nourished, well developed, in no acute distress  HEENT: normocephalic, atraumatic  Neck: no JVD, carotid bruits or masses Cardiac:  IRRR, tachycardic; no significant murmurs, no rubs, or gallops Lungs:  CTA b/l, no wheezing, rhonchi or rales  Abd: soft, nontender MS: no deformity or atrophy Ext: no edema  Skin: warm and dry, no rash Neuro:  No gross deficits appreciated Psych: euthymic mood, full affect   EKG:  Done 04/25/17 likely AFlutter 134bpm, RBBB 04/23/17 atypical appearing Aflutter, 64bpm, RBBB 04/23/17 AFib/flutter 91bpm, RBBB 12/16/16 SB, 53bpm, RBBB, LAFB  10/01/16 TTE - Left ventricle: The cavity size was normal. Systolic function was normal. The estimated ejection fraction was in the range of 55% to 60%. Wall motion was normal; there were no regional wall motion abnormalities. Doppler parameters are consistent with a reversible restrictive pattern, indicative of decreased left ventricular diastolic compliance and/or increased left atrial pressure (grade 3 diastolic dysfunction). Doppler parameters are consistent with high ventricular filling pressure. - Aortic valve: Transvalvular velocity was within the normal range. There was no stenosis. - Mitral valve: Transvalvular velocity was within the normal range. There was no evidence for stenosis. There was mild regurgitation. - Right ventricle: The cavity size was mildly dilated. Wall thickness was normal. Systolic function was normal. - Atrial septum: No defect or patent foramen ovale was identified by color flow Doppler. - Tricuspid valve: There was mild regurgitation. - Pulmonary arteries: Systolic pressure was moderately to severely increased. PA peak pressure: 57 mm Hg (S). - Pericardium, extracardiac: A trivial pericardial effusion was identified.   Recent  Labs: 03/15/2017: ALT 18 04/22/2017: BUN 20; Creatinine, Ser 1.48; Hemoglobin 16.8; Platelets 145; Potassium 3.4; Sodium 141 04/23/2017: TSH 0.998  11/30/2016: Cholesterol 220; HDL 46.30; LDL Cholesterol 154; Total CHOL/HDL Ratio 5; Triglycerides 102.0; VLDL 20.4   Estimated Creatinine Clearance: 51.3 mL/min (A) (by C-G formula based on SCr of 1.48 mg/dL (H)).   Wt Readings from Last 3 Encounters:  04/27/17 213 lb (96.6 kg)  04/25/17 214 lb (97.1 kg)  04/22/17 210 lb (95.3 kg)     Other studies reviewed: Additional studies/records reviewed today include: summarized above  ASSESSMENT AND PLAN:  1. Paroxysmal AFib     CHA2DS2Vasc is 4, patient had declined a/cthough willing to start at his AF clinic visit     He reports feaful given TV adds and financially is difficult.       We gave him finacial assistance information and paperwork to follow up with the our offfice to help with any potential assistance he may be eligible for   RATE initially was 130's on arrival though the patient tells me yesterday  was much better 70's.  He said he could tell when lying supine he is aware of he=is heart beat and wasn't fast and is very resistant to additional medicines. Recommend he not rely on lying supine to establish his HR, he said he could also tell by checking his pulse and while here felt his radial pulse and said it seemed fine to me, I auscultated his HR and was in fact 104.   Given recent up-titration of his lopressor and HR low 100s now, his resistance to titration of meds, no changes, will have him wear a 48 hour monitor first.  Discussed importance of getting HR control  2. HTN     A recheck is 110/80, no changes today  3. Medicine non-compliance     Discussed importance of his a/c, and medicines in general   Disposition: F/u in 1 month, sooner if needed pending monitor and/or should he notice his HR at home consistantly elevated  Current medicines are reviewed at length with the patient  today.  The patient did not have any concerns regarding medicines.  Haywood Lasso, PA-C 04/27/2017 10:15 AM     [facility] [address] [place] [address] [phone] (office)  [phone] (fax)

## 2017-04-26 NOTE — Progress Notes (Signed)
Primary Care Physician: Delorse Limber Referring Physician: [facility] ER Cardiologist: Dr. Christiana Pellant is a 71 y.o. male with a h/o afib that was recently seen in the ER for return of afib. He has a history of non compliance and stopping drugs at will. He had been on metoprolol and xarelto at one time but stopped because he was maintaining SR. He is currently in afib with RVR. He did increase his metoprolol to a full pill bid from 12.5 mg bid but still with RVR. He is very  hesitant re anticoagulation and said he did not like xarelto, I think primarily due to the TV ads he was seeing and cost of drug. He has a chadsvasc score of 4( previous CVA, age, htn). He is somewhat defiant for reason for  afib, stating that he has  cervical spine issues, degenerative disease, that is causing pain and he feels that that is what brought on afib.He feels that he may have to have surgery and is being worked up by neurology. He had been offered ablation by Dr. [name] in the recent past and he deferred. States that he had a brother to die after heart surgery.  Today, he denies symptoms of palpitations, chest pain, shortness of breath, orthopnea, PND, lower extremity edema, dizziness, presyncope, syncope, or neurologic sequela. The patient is tolerating medications without difficulties and is otherwise without complaint today.   Past Medical History:  Diagnosis Date  . Colon polyps   . History of chicken pox   . Hyperlipidemia   . Hypertension   . Internal hemorrhoids   . Sciatica   . Stroke [facility]) 2008   Past Surgical History:  Procedure Laterality Date  . COLONOSCOPY  2011  . great toe surgery    . PROSTATE BIOPSY      Current Outpatient Prescriptions  Medication Sig Dispense Refill  . aspirin 81 MG tablet Take 81 mg by mouth daily.    . chlorthalidone (HYGROTON) 25 MG tablet Take 0.5 tablets (12.5 mg total) by mouth daily. 30 tablet 1  . gabapentin (NEURONTIN) 100 MG capsule Take 1  capsule in the morning, 1 capsule in the afternoon and 3 capsules in the evening (Patient taking differently: Take 100-300 mg by mouth 2 (two) times daily. Take 1 capsule in the morning and 3 capsules in the evening) 150 capsule 3  . metoprolol tartrate (LOPRESSOR) 25 MG tablet Take 1.5 tablets (37.5 mg total) by mouth 2 (two) times daily. 30 tablet 0  . apixaban (ELIQUIS) 5 MG TABS tablet Take 1 tablet (5 mg total) by mouth 2 (two) times daily. 60 tablet 0   No current facility-administered medications for this encounter.     Allergies  Allergen Reactions  . Crestor [Rosuvastatin Calcium] Shortness Of Breath    Muscle cramps  . Spironolactone Shortness Of Breath  . Terazosin Shortness Of Breath, Palpitations and Other (See Comments)    Nerve pain  . Atenolol Other (See Comments)    Drowsiness and "flu sxs"  . Bystolic [Nebivolol Hcl] Other (See Comments)    GI issues  . Hydralazine Other (See Comments)    Joint swelling  . Tizanidine Other (See Comments)    Syncope, Elevated BP/pulse  . Amlodipine Other (See Comments)  . Clonidine Other (See Comments)  . Hydrocodone-Acetaminophen Other (See Comments)  . Levofloxacin Other (See Comments)  . Losartan Other (See Comments)  . Maxzide [Hydrochlorothiazide W-Triamterene] Other (See Comments)    Does not tolerate  potassium-sparing diuretics  . Olmesartan Other (See Comments)  . Tussionex Pennkinetic Er [Hydrocod Polst-Cpm Polst Er] Other (See Comments)    Other reaction(s): Unknown    Social History   Social History  . Marital status: Married    Spouse name: N/A  . Number of children: 1  . Years of education: 44   Occupational History  . Retired    Social History Main Topics  . Smoking status: Never Smoker  . Smokeless tobacco: Never Used  . Alcohol use No  . Drug use: No  . Sexual activity: Not on file   Other Topics Concern  . Not on file   Social History Narrative   Fun: Play golf and build furniture.      Family History  Problem Relation Age of Onset  . Hypertension Father 49       Deceased  . Heart disease Father   . Lung cancer Mother 76       Deceased  . Healthy Sister        x3  . Healthy Brother        x4  . Heart disease Brother        #5  . Other Daughter        Alpha Thalassemia  . Lupus Daughter        #2-deceased  . Diabetes Neg Hx   . Heart attack Neg Hx   . Hyperlipidemia Neg Hx   . Sudden death Neg Hx     ROS- All systems are reviewed and negative except as per the HPI above  Physical Exam: Vitals:   04/25/17 1434  BP: (!) 126/104  Pulse: (!) 134  Weight: 214 lb (97.1 kg)  Height: 5' 8.5" (1.74 m)   Wt Readings from Last 3 Encounters:  04/25/17 214 lb (97.1 kg)  04/22/17 210 lb (95.3 kg)  03/29/17 212 lb (96.2 kg)    Labs: Lab Results  Component Value Date   NA 141 04/22/2017   K 3.4 (L) 04/22/2017   CL 105 04/22/2017   CO2 27 04/22/2017   GLUCOSE 121 (H) 04/22/2017   BUN 20 04/22/2017   CREATININE 1.48 (H) 04/22/2017   CALCIUM 8.9 04/22/2017   Lab Results  Component Value Date   INR 1.1 11/22/2007   Lab Results  Component Value Date   CHOL 220 (H) 11/30/2016   HDL 46.30 11/30/2016   LDLCALC 154 (H) 11/30/2016   TRIG 102.0 11/30/2016     GEN- The patient is well appearing, alert and oriented x 3 today.   Head- normocephalic, atraumatic Eyes-  Sclera clear, conjunctiva pink Ears- hearing intact Oropharynx- clear Neck- supple, no JVP Lymph- no cervical lymphadenopathy Lungs- Clear to ausculation bilaterally, normal work of breathing Heart- Rapid irregular rate and rhythm, no murmurs, rubs or gallops, PMI not laterally displaced GI- soft, NT, ND, + BS Extremities- no clubbing, cyanosis, or edema MS- no significant deformity or atrophy Skin- no rash or lesion Psych- euthymic mood, full affect Neuro- strength and sensation are intact  EKG-aflutter at 134 bpm, RBBB, LAFB, qrs int 138 ms, qtc 471 ms Epic records  reviewed    Assessment and Plan: 1.aflutter General education re afib/flutter given Pt had stopped BB and DOAC in recent months because he was in SR and felt he did not need anymore H/o non compliance with drugs Increase metoprolol to 37 1/2 mg bid Start eliquis 5 mg bid for chadsvasc score of 4 (30 day free card given) Stressed the  need for anticoagulant especially with h/o prior stroke and will need in place x 21 days to try to restore SR Bleeding precautions given  F/u Loyal Jacobson, PA on June 6 as scheduled Dr. [name] 7/19  Geroge Baseman. Seydou Hearns, [facility] [address] [place], [place] [address] [phone]

## 2017-04-27 ENCOUNTER — Ambulatory Visit (INDEPENDENT_AMBULATORY_CARE_PROVIDER_SITE_OTHER): Payer: Medicare Other | Admitting: Physician Assistant

## 2017-04-27 VITALS — BP 135/103 | HR 138 | Ht 68.5 in | Wt 213.0 lb

## 2017-04-27 DIAGNOSIS — I481 Persistent atrial fibrillation: Secondary | ICD-10-CM

## 2017-04-27 DIAGNOSIS — I10 Essential (primary) hypertension: Secondary | ICD-10-CM | POA: Diagnosis not present

## 2017-04-27 DIAGNOSIS — I4819 Other persistent atrial fibrillation: Secondary | ICD-10-CM

## 2017-04-27 NOTE — Patient Instructions (Signed)
Medication Instructions:   Your physician recommends that you continue on your current medications as directed. Please refer to the Current Medication list given to you today.   If you need a refill on your cardiac medications before your next appointment, please call your pharmacy.  Labwork:  NONE ORDERED  TODAY     Testing/Procedures: Your physician has recommended that you wear a holter monitor. Holter monitors are medical devices that record the heart's electrical activity. Doctors most often use these monitors to diagnose arrhythmias. Arrhythmias are problems with the speed or rhythm of the heartbeat. The monitor is a small, portable device. You can wear one while you do your normal daily activities. This is usually used to diagnose what is causing palpitations/syncope (passing out).   Follow-Up: IN  3 TO 4 WEEKS  WITH URSUY OR CAMNITZ   Any Other Special Instructions Will Be Listed Below (If Applicable).

## 2017-05-03 ENCOUNTER — Other Ambulatory Visit: Payer: Self-pay

## 2017-05-03 ENCOUNTER — Telehealth: Payer: Self-pay

## 2017-05-03 MED ORDER — APIXABAN 5 MG PO TABS: 5.0000 mg | ORAL_TABLET | Freq: Two times a day (BID) | ORAL | 3 refills | Status: DC

## 2017-05-03 NOTE — Telephone Encounter (Signed)
**Note De-Identified  Obfuscation** The pt states that he will bring a list from his pharmacy showing his out of pocket cost for his medications on Friday 6/15 as he has an appt here that day to get a holter monitor.

## 2017-05-03 NOTE — Telephone Encounter (Signed)
The Roth left his Patrick Roth assistance application here at the office. Dr [name] has signed both the application and the prescription for Eliquis.  Before faxing to [employer] I checked to be sure all paperwork was there and noticed that the pts out of pocket medication expense report is not included.  I called the pts home but got no answer and there is no way to leave a message. I will continue to call.

## 2017-05-04 ENCOUNTER — Encounter (HOSPITAL_COMMUNITY): Payer: Self-pay | Admitting: *Deleted

## 2017-05-04 ENCOUNTER — Telehealth: Payer: Self-pay | Admitting: Physician Assistant

## 2017-05-04 ENCOUNTER — Emergency Department (HOSPITAL_COMMUNITY): Payer: Medicare Other

## 2017-05-04 ENCOUNTER — Emergency Department (HOSPITAL_COMMUNITY)
Admission: EM | Admit: 2017-05-04 | Discharge: 2017-05-04 | Disposition: A | Discharge status: Home / Self Care | Payer: Medicare Other | Attending: Emergency Medicine | Admitting: Emergency Medicine

## 2017-05-04 DIAGNOSIS — Z79899 Other long term (current) drug therapy: Secondary | ICD-10-CM | POA: Insufficient documentation

## 2017-05-04 DIAGNOSIS — I10 Essential (primary) hypertension: Secondary | ICD-10-CM | POA: Insufficient documentation

## 2017-05-04 DIAGNOSIS — Z7982 Long term (current) use of aspirin: Secondary | ICD-10-CM | POA: Diagnosis not present

## 2017-05-04 DIAGNOSIS — R079 Chest pain, unspecified: Secondary | ICD-10-CM | POA: Diagnosis not present

## 2017-05-04 DIAGNOSIS — R0600 Dyspnea, unspecified: Secondary | ICD-10-CM

## 2017-05-04 DIAGNOSIS — E876 Hypokalemia: Secondary | ICD-10-CM | POA: Diagnosis not present

## 2017-05-04 DIAGNOSIS — R0602 Shortness of breath: Secondary | ICD-10-CM | POA: Diagnosis not present

## 2017-05-04 HISTORY — DX: Cardiac arrhythmia, unspecified: I49.9

## 2017-05-04 LAB — BASIC METABOLIC PANEL
Anion gap: 9 (ref 5–15)
BUN: 14 mg/dL (ref 6–20)
CHLORIDE: 101 mmol/L (ref 101–111)
CO2: 28 mmol/L (ref 22–32)
CREATININE: 1.44 mg/dL — AB (ref 0.61–1.24)
Calcium: 9 mg/dL (ref 8.9–10.3)
GFR calc Af Amer: 55 mL/min — ABNORMAL LOW (ref >60)
GFR calc non Af Amer: 47 mL/min — ABNORMAL LOW (ref >60)
Glucose, Bld: 118 mg/dL — ABNORMAL HIGH (ref 65–99)
Potassium: 2.8 mmol/L — ABNORMAL LOW (ref 3.5–5.1)
SODIUM: 138 mmol/L (ref 135–145)

## 2017-05-04 LAB — CBC
HCT: 47.5 % (ref 39.0–52.0)
Hemoglobin: 15 g/dL (ref 13.0–17.0)
MCH: 27.7 pg (ref 26.0–34.0)
MCHC: 31.6 g/dL (ref 30.0–36.0)
MCV: 87.8 fL (ref 78.0–100.0)
PLATELETS: 156 10*3/uL (ref 150–400)
RBC: 5.41 MIL/uL (ref 4.22–5.81)
RDW: 14.1 % (ref 11.5–15.5)
WBC: 6.1 10*3/uL (ref 4.0–10.5)

## 2017-05-04 LAB — I-STAT TROPONIN, ED: Troponin i, poc: 0.03 ng/mL (ref 0.00–0.08)

## 2017-05-04 MED ORDER — POTASSIUM CHLORIDE CRYS ER 20 MEQ PO TBCR: 20.0000 meq | EXTENDED_RELEASE_TABLET | Freq: Two times a day (BID) | ORAL | 0 refills | Status: DC

## 2017-05-04 NOTE — ED Notes (Signed)
Xray notified to return to room from scans. Family brought back to room.

## 2017-05-04 NOTE — Telephone Encounter (Signed)
Patient Name: Patrick Roth  DOB: February 26, 1946    Initial Comment Caller states he has been taking Eloquis. He feels like he isn't getting enough oxygen, hard to get enough of a breath.   Nurse Assessment  Nurse: Raphael Gibney, RN, Vanita Ingles Date/Time (Eastern Time): 05/04/2017 7:57:33 AM  Confirm and document reason for call. If symptomatic, describe symptoms. ---Caller states he started on Eliquis a week ago after going to the ER for elevated BP. History of CVA. BP 127/70. Feels like he is not getting enough air on the left side. Has aching on the left side of his chest.  Does the patient have any new or worsening symptoms? ---Yes  Will a triage be completed? ---Yes  Related visit to physician within the last 2 weeks? ---No  Does the PT have any chronic conditions? (i.e. diabetes, asthma, etc.) ---Yes  List chronic conditions. ---CVA  Is this a behavioral health or substance abuse call? ---No     Guidelines    Guideline Title Affirmed Question Affirmed Notes  Chest Pain Difficulty breathing    Final Disposition User   Go to ED Now Raphael Gibney, RN, [place] office and spoke to [place] and gave report that pt has some SOB with chest pain on the left. Not severe chest pain. Triage outcome of go to ER now. Pt does not want to go to the ER.   Referrals  GO TO FACILITY REFUSED   Disagree/Comply: Disagree  Disagree/Comply Reason: Disagree with instructions

## 2017-05-04 NOTE — Telephone Encounter (Signed)
Spoke with patient regarding symptoms. Patient reports shortness of breath last night, woke up feeling that he is unable to take a deep breath on his left side. Also reports left sided ache and chills. Advised patient to go to the Emergency Department for assessment/treatment. Patient verbalized understanding, states he has someone to take him.

## 2017-05-04 NOTE — ED Provider Notes (Signed)
[facility] DEPT Provider Note   CSN: [id number] Arrival date & time: 05/04/17  1004     History   Chief Complaint Chief Complaint  Patient presents with  . Chest Pain  . Shortness of Breath    HPI Patrick Roth is a 71 y.o. male.  HPI Patient presents with chest pain and shortness of breath. States that he gets episodes at night when he wakes up. States he feels very short of breath and has some chest tightness at the time. Also some pain in his back. States he has a history of neck problems and feels like this. No numbness or weakness. States the pain does go down his left arm. He does not have episodes during the day. History of atrial fibrillation. Recent started back on A. fib medications. No cough. No swelling in his legs. He is able to do his normal physical activity during the day.States when the episodes happen at night he has trouble breathing out of his left nostril.   Past Medical History:  Diagnosis Date  . Colon polyps   . History of chicken pox   . Hyperlipidemia   . Hypertension   . Internal hemorrhoids   . Irregular heartbeat   . Sciatica   . Stroke [facility]) 2008    Patient Active Problem List   Diagnosis Date Noted  . Lumbar radiculopathy, chronic 03/29/2017  . Persistent atrial fibrillation ([place]) 09/14/2016  . Faintness 06/20/2016  . Hyperlipemia 02/13/2016  . Essential hypertension 05/27/2015  . Mallet deformity of fourth finger, right 07/11/2013    Past Surgical History:  Procedure Laterality Date  . COLONOSCOPY  2011  . great toe surgery    . PROSTATE BIOPSY         Home Medications    Prior to Admission medications   Medication Sig Start Date End Date Taking? Authorizing Provider  apixaban (ELIQUIS) 5 MG TABS tablet Take 1 tablet (5 mg total) by mouth 2 (two) times daily. 05/03/17   Camnitz, Ocie Doyne, MD  aspirin 81 MG tablet Take 81 mg by mouth daily.    [provider]  chlorthalidone (HYGROTON) 25 MG tablet Take 0.5  tablets (12.5 mg total) by mouth daily. 04/07/17   Brunetta Jeans, PA-C  gabapentin (NEURONTIN) 100 MG capsule Take 1 capsule in the morning, 1 capsule in the afternoon and 3 capsules in the evening Patient taking differently: Take 100-300 mg by mouth 2 (two) times daily. Take 1 capsule in the morning and 3 capsules in the evening 04/05/17   Brunetta Jeans, PA-C  metoprolol tartrate (LOPRESSOR) 25 MG tablet Take 1.5 tablets (37.5 mg total) by mouth 2 (two) times daily. 04/25/17   Sherran Needs, NP  potassium chloride SA (K-DUR,KLOR-CON) 20 MEQ tablet Take 1 tablet (20 mEq total) by mouth 2 (two) times daily. 05/04/17   Davonna Belling, MD    Family History Family History  Problem Relation Age of Onset  . Hypertension Father 86       Deceased  . Heart disease Father   . Lung cancer Mother 34       Deceased  . Healthy Sister        x3  . Healthy Brother        x4  . Heart disease Brother        #5  . Other Daughter        Alpha Thalassemia  . Lupus Daughter        #2-deceased  .  Diabetes Neg Hx   . Heart attack Neg Hx   . Hyperlipidemia Neg Hx   . Sudden death Neg Hx     Social History Social History  Substance Use Topics  . Smoking status: Never Smoker  . Smokeless tobacco: Never Used  . Alcohol use No     Allergies   Crestor [rosuvastatin calcium]; Spironolactone; Terazosin; Atenolol; Bystolic [nebivolol hcl]; Hydralazine; Tizanidine; Amlodipine; Clonidine; Hydrocodone-acetaminophen; Levofloxacin; Losartan; Maxzide [hydrochlorothiazide w-triamterene]; Olmesartan; and Tussionex pennkinetic er [hydrocod polst-cpm polst er]   Review of Systems Review of Systems  Constitutional: Negative for appetite change and fever.  HENT: Negative for congestion.   Respiratory: Positive for shortness of breath. Negative for cough.   Cardiovascular: Positive for chest pain.  Gastrointestinal: Negative for abdominal pain.  Genitourinary: Negative for flank pain and hematuria.    Musculoskeletal: Positive for neck pain.  Skin: Negative for rash.  Neurological: Negative for weakness and numbness.  Hematological: Negative for adenopathy.  Psychiatric/Behavioral: Negative for confusion.     Physical Exam Updated Vital Signs BP 139/85   Pulse (!) 51   Temp 98.1 F (36.7 C) (Oral)   Resp 16   Ht 5' 8.5" (1.74 m)   Wt 96.6 kg (213 lb)   SpO2 96%   BMI 31.92 kg/m   Physical Exam  Constitutional: He appears well-developed.  HENT:  Head: Atraumatic.  Bilateral TMs normal. Mild erythema of mucosa in bilateral naris.  Eyes: EOM are normal.  Neck: Neck supple. No JVD present.  Cardiovascular: Normal rate and regular rhythm.   Murmur heard. Mild systolic murmur  Pulmonary/Chest: Effort normal.  Abdominal: There is no tenderness.  Musculoskeletal: He exhibits no edema.  Neurological: He is alert.  Skin: Skin is warm. Capillary refill takes less than 2 seconds.  Psychiatric: He has a normal mood and affect.     ED Treatments / Results  Labs (all labs ordered are listed, but only abnormal results are displayed) Labs Reviewed  BASIC METABOLIC PANEL - Abnormal; Notable for the following:       Result Value   Potassium 2.8 (*)    Glucose, Bld 118 (*)    Creatinine, Ser 1.44 (*)    GFR calc non Af Amer 47 (*)    GFR calc Af Amer 55 (*)    All other components within normal limits  CBC  I-STAT TROPOININ, ED    EKG  EKG Interpretation  Date/Time:  Wednesday May 04 2017 10:32:06 EDT Ventricular Rate:  51 PR Interval:  208 QRS Duration: 140 QT Interval:  492 QTC Calculation: 453 R Axis:   -65 Text Interpretation:  Sinus bradycardia with Premature atrial complexes Left axis deviation Right bundle branch block Inferior infarct , age undetermined Anterior infarct , age undetermined T wave abnormality, consider lateral ischemia Abnormal ECG Confirmed by Alvino Chapel  MD, Ovid Curd [phone]) on 05/04/2017 11:16:10 AM       Radiology Dg Chest 2  View  Result Date: 05/04/2017 CLINICAL DATA:  Left side chest pain and shortness of breath (only on the left side) for the past few days, worsening with chills, left arm pain and back pain today. Hx htn EXAM: CHEST  2 VIEW COMPARISON:  04/22/2017 FINDINGS: Moderate thoracic spondylosis. Midline trachea. Borderline cardiomegaly. Tortuous thoracic aorta. No pleural effusion or pneumothorax. Clear lungs. IMPRESSION: Borderline cardiomegaly, without acute disease. Electronically Signed   By: Abigail Miyamoto M.D.   On: 05/04/2017 11:37    Procedures Procedures (including critical care time)  Medications Ordered in  ED Medications - No data to display   Initial Impression / Assessment and Plan / ED Course  I have reviewed the triage vital signs and the nursing notes.  Pertinent labs & imaging results that were available during my care of the patient were reviewed by me and considered in my medical decision making (see chart for details).     Patient with shortness of breath at night when laying back. EKG and lab work reassuring. X-ray reassuring. Does however have mild hypokalemia. Will supplement the liver followed by primary care doctor. Discharge home. Does not appear to be in CHF.  Final Clinical Impressions(s) / ED Diagnoses   Final diagnoses:  Dyspnea, unspecified type  Hypokalemia    New Prescriptions Discharge Medication List as of 05/04/2017  1:46 PM    START taking these medications   Details  potassium chloride SA (K-DUR,KLOR-CON) 20 MEQ tablet Take 1 tablet (20 mEq total) by mouth 2 (two) times daily., Starting Wed 05/04/2017, Print         Davonna Belling, MD 05/04/17 [phone]

## 2017-05-04 NOTE — Telephone Encounter (Signed)
Thank you for following up on this

## 2017-05-04 NOTE — ED Triage Notes (Signed)
Pt states sob and L chest pressure.  Recently placed on Eloquis for irregular heartbeat.

## 2017-05-04 NOTE — Discharge Instructions (Signed)
Follow-up for further evaluation the trouble breathing and recheck the low potassium.

## 2017-05-06 ENCOUNTER — Ambulatory Visit (INDEPENDENT_AMBULATORY_CARE_PROVIDER_SITE_OTHER): Payer: Medicare Other

## 2017-05-06 DIAGNOSIS — I481 Persistent atrial fibrillation: Secondary | ICD-10-CM

## 2017-05-06 DIAGNOSIS — I4819 Other persistent atrial fibrillation: Secondary | ICD-10-CM

## 2017-05-06 NOTE — Telephone Encounter (Signed)
**Note De-Identified  Obfuscation** The pt brought his of out of pocket medication expense list. I have faxed all needed info to [employer] for pt assistance on Eliquis. Awaiting response.

## 2017-05-09 DIAGNOSIS — M5126 Other intervertebral disc displacement, lumbar region: Secondary | ICD-10-CM | POA: Diagnosis not present

## 2017-05-09 DIAGNOSIS — M5136 Other intervertebral disc degeneration, lumbar region: Secondary | ICD-10-CM | POA: Diagnosis not present

## 2017-05-09 DIAGNOSIS — M5416 Radiculopathy, lumbar region: Secondary | ICD-10-CM | POA: Diagnosis not present

## 2017-05-09 DIAGNOSIS — M51369 Other intervertebral disc degeneration, lumbar region without mention of lumbar back pain or lower extremity pain: Secondary | ICD-10-CM | POA: Insufficient documentation

## 2017-05-09 DIAGNOSIS — M5412 Radiculopathy, cervical region: Secondary | ICD-10-CM | POA: Diagnosis not present

## 2017-05-11 NOTE — Telephone Encounter (Signed)
Received a letter via fax stating that [employer] has received the pts pt assistance application and that it is under review.

## 2017-05-17 DIAGNOSIS — M542 Cervicalgia: Secondary | ICD-10-CM | POA: Diagnosis not present

## 2017-05-23 NOTE — Progress Notes (Signed)
Cardiology Office Note Date:  05/23/2017  Patient ID:  Patrick Roth [birth year], MRN [id number] PCP:  Brunetta Jeans, PA-C  Cardiologist:  Dr. Acie Fredrickson Electrophysiologist: Dr. [name]   Chief Complaint: planned f/u  History of Present Illness: Patrick Roth is a 71 y.o. male with history of persistent AFib, HTN, CVA,  HLD, syncope attributed to orthostasis in 2017.  He comes in today to be seen for Dr. [name], last seen by him in Jan 2018, at that visit the patient had stopped most of his medicines including his Xarelto and Multaq, at that visit despite discussion on stroke risk the patient declined a/c given warnings from law firm ads on TV, at that time was in [place].  More recently he had a visit with PMD office there was found to have asymptomatic bradycardia with HR 54 and his metoprolol was reduced  To 1/2 tab (12.45md daily) and chlorthalidone added for BP.  (PMD notes hx of medicine noncompliance), at f/u visit in May lopressor was discontinued with report of HR 60.  04/23/17 had ER visit with palpitations found in AF w/RVR ultimately treated with dilt gtt, noting as well marked HTN, discharged with lopressor 12.5mg  BID rates reported 60s.  04/25/17 via the AF clinic, increased to 37.5mg  BID and add Eliquis 5mg  BID.    He was seen by myself a few weeks ago, he reported feeling well.  He denied overt palpitations and said he can tell his heart is racing when he can feel it thumping on the left side or when supine he is able to tell about what his HR is.  He finds a direct correlation to fast HR and his severe neck pain.  When he is in pain his HR goes up, but usually is 50's-60's.  He denies any CP or SOB, no dizziness, near syncope or syncope.  He was very reluctant to consider increasing his medicine reportind he had a good day the day prior with HR 70's.  He felt strongly his HR elevation is dependent on his chronic pain.  Ultimately no changes were made, initially at that visit his HR  130, and BP elevated, though after a few minutes, both did improve, and elected to have him wear a holter given his significant resistance to titration of his medicines.  He was at the EF only a couple days after his last visit with the feeling of acutely unable to feel like he could get air in, he had no CP, palpitations, was at rest and felt like he was acutely SOB.  He reports taking his Eliquis BID without missing doses.  He said it self resolved, and has not occurred again.  He reports over the last 9 years having similar symptoms with walking inclines or increased exertional activities, thought to have been medicine side effects.  He then mentions that sometimes it feel like his nose is plugged as the reason he can't breathe well.    He was found to have low K+ and started on replacement, neg CXR and neg Trop x1, and discharged from the ER.  He denies any bleeding or signs of bleeding, he was given information to f/u with his medicine assistance program for Eliquis.    Past Medical History:  Diagnosis Date  . Colon polyps   . History of chicken pox   . Hyperlipidemia   . Hypertension   . Internal hemorrhoids   . Irregular heartbeat   . Sciatica   . Stroke [facility])  2008    Past Surgical History:  Procedure Laterality Date  . COLONOSCOPY  2011  . great toe surgery    . PROSTATE BIOPSY      Current Outpatient Prescriptions  Medication Sig Dispense Refill  . apixaban (ELIQUIS) 5 MG TABS tablet Take 1 tablet (5 mg total) by mouth 2 (two) times daily. 180 tablet 3  . aspirin 81 MG tablet Take 81 mg by mouth daily.    . chlorthalidone (HYGROTON) 25 MG tablet Take 0.5 tablets (12.5 mg total) by mouth daily. 30 tablet 1  . gabapentin (NEURONTIN) 100 MG capsule Take 1 capsule in the morning, 1 capsule in the afternoon and 3 capsules in the evening (Patient taking differently: Take 100-300 mg by mouth 2 (two) times daily. Take 1 capsule in the morning and 3 capsules in the evening) 150  capsule 3  . metoprolol tartrate (LOPRESSOR) 25 MG tablet Take 1.5 tablets (37.5 mg total) by mouth 2 (two) times daily. 30 tablet 0  . potassium chloride SA (K-DUR,KLOR-CON) 20 MEQ tablet Take 1 tablet (20 mEq total) by mouth 2 (two) times daily. 16 tablet 0   No current facility-administered medications for this visit.     Allergies:   Crestor [rosuvastatin calcium]; Spironolactone; Terazosin; Atenolol; Bystolic [nebivolol hcl]; Hydralazine; Tizanidine; Amlodipine; Clonidine; Hydrocodone-acetaminophen; Levofloxacin; Losartan; Maxzide [hydrochlorothiazide w-triamterene]; Olmesartan; and Tussionex pennkinetic er [hydrocod polst-cpm polst er]   Social History:  The patient  reports that he has never smoked. He has never used smokeless tobacco. He reports that he does not drink alcohol or use drugs.   Family History:  The patient's family history includes Healthy in his brother and sister; Heart disease in his brother and father; Hypertension (age of onset: 23) in his father; Lung cancer (age of onset: 67) in his mother; Lupus in his daughter; Other in his daughter.  ROS:  Please see the history of present illness.  All other systems are reviewed and otherwise negative.   PHYSICAL EXAM:  VS:  There were no vitals taken for this visit. BMI: There is no height or weight on file to calculate BMI. Well nourished, well developed, in no acute distress  HEENT: normocephalic, atraumatic  Neck: no JVD, carotid bruits or masses Cardiac:  RRR, tachycardic; no significant murmurs, no rubs, or gallops Lungs:  CTA b/l, no wheezing, rhonchi or rales  Abd: soft, nontender MS: no deformity or atrophy Ext:  no edema  Skin: warm and dry, no rash Neuro:  No gross deficits appreciated Psych: euthymic mood, full affect   EKG:  Done today and reviewed by myself with Dr. [name], SB, 46bpm,. 1st degree AVBlock PR 211ms, RBBB, LAD 05/04/17 SB, 51bpm PACs, RBBB, T changes lat Done 04/25/17 likely AFlutter 134bpm,  RBBB 04/23/17 atypical appearing Aflutter, 64bpm, RBBB 04/23/17 AFib/flutter 91bpm, RBBB 12/16/16 SB, 53bpm, RBBB, LAFB  6./15/18: 48 hor holter Minimum HR: 37 BPM at 12:18:54 PM Maximum HR: 124 BPM at 7:11:26 PM Average HR: 50 BPM 1% ventricular beats 4% supraventricular beats Ventricular runs of 6 beats Supraventricular runs of 13 beats  Zero atrial fibrillaiton noted on monitor. Average HR 50. No med changes at this time. Will [name], MD  10/01/16 TTE - Left ventricle: The cavity size was normal. Systolic function was normal. The estimated ejection fraction was in the range of 55% to 60%. Wall motion was normal; there were no regional wall motion abnormalities. Doppler parameters are consistent with a reversible restrictive pattern, indicative of decreased left ventricular diastolic compliance and/or  increased left atrial pressure (grade 3 diastolic dysfunction). Doppler parameters are consistent with high ventricular filling pressure. - Aortic valve: Transvalvular velocity was within the normal range. There was no stenosis. - Mitral valve: Transvalvular velocity was within the normal range. There was no evidence for stenosis. There was mild regurgitation. - Right ventricle: The cavity size was mildly dilated. Wall thickness was normal. Systolic function was normal. - Atrial septum: No defect or patent foramen ovale was identified by color flow Doppler. - Tricuspid valve: There was mild regurgitation. - Pulmonary arteries: Systolic pressure was moderately to severely increased. PA peak pressure: 57 mm Hg (S). - Pericardium, extracardiac: A trivial pericardial effusion was identified.  10/10/15: lexiscan stress myoview  There was no ST segment deviation noted during stress.  No T wave inversion was noted during stress.  The study is normal.  This is a low risk study.  Low risk stress nuclear study with normal perfusion. Non gated  study.   Recent Labs: 03/15/2017: ALT 18 04/23/2017: TSH 0.998 05/04/2017: BUN 14; Creatinine, Ser 1.44; Hemoglobin 15.0; Platelets 156; Potassium 2.8; Sodium 138  11/30/2016: Cholesterol 220; HDL 46.30; LDL Cholesterol 154; Total CHOL/HDL Ratio 5; Triglycerides 102.0; VLDL 20.4   CrCl cannot be calculated (Unknown ideal weight.).   Wt Readings from Last 3 Encounters:  05/04/17 213 lb (96.6 kg)  04/27/17 213 lb (96.6 kg)  04/25/17 214 lb (97.1 kg)     Other studies reviewed: Additional studies/records reviewed today include: summarized above  ASSESSMENT AND PLAN:  1. Paroxysmal AFib     CHA2DS2Vasc is 4, patient had declined a/c though willing to start at his last AF clinic visit     He reports feaful given TV adds and financially is difficult.       We have given him finacial assistance information and paperwork to follow up with the our offfice to help with any potential assistance he may be eligible for      05/04/17 Creat 1.44, H/H 15/47  2. HTN     No changes today  3. Medicine non-compliance     Discussed importance of his a/c, and medicines in general  4. Asymtpomatic bradycardia     Monitor avg HR 50  5. SOB ?     No CP     Negative stress test 2 years ago     D/w Dr. [name], no further w/u    Disposition: BMET today to f/u on K+,  f/u in 3 months sooner if needed   Current medicines are reviewed at length with the patient today.  The patient did not have any concerns regarding medicines.  Haywood Lasso, PA-C 05/23/2017 5:57 AM     [address] [place] [address] [phone] (office)  [phone] (fax)

## 2017-05-24 ENCOUNTER — Ambulatory Visit (INDEPENDENT_AMBULATORY_CARE_PROVIDER_SITE_OTHER): Payer: Medicare Other | Admitting: Physician Assistant

## 2017-05-24 VITALS — BP 138/84 | HR 56 | Ht 68.5 in | Wt 212.0 lb

## 2017-05-24 DIAGNOSIS — I480 Paroxysmal atrial fibrillation: Secondary | ICD-10-CM | POA: Diagnosis not present

## 2017-05-24 DIAGNOSIS — Z79899 Other long term (current) drug therapy: Secondary | ICD-10-CM | POA: Diagnosis not present

## 2017-05-24 DIAGNOSIS — I10 Essential (primary) hypertension: Secondary | ICD-10-CM

## 2017-05-24 NOTE — Patient Instructions (Addendum)
Medication Instructions:   Your physician recommends that you continue on your current medications as directed. Please refer to the Current Medication list given to you today.   If you need a refill on your cardiac medications before your next appointment, please call your pharmacy.  Labwork:  BMET TODAY     Testing/Procedures: NONE ORDERED  TODAY    Follow-Up: DR CAMNITZ IN 3 MONTHS    Any Other Special Instructions Will Be Listed Below (If Applicable).

## 2017-05-25 LAB — BASIC METABOLIC PANEL
BUN / CREAT RATIO: 12 (ref 10–24)
BUN: 17 mg/dL (ref 8–27)
CO2: 27 mmol/L (ref 20–29)
Calcium: 9.5 mg/dL (ref 8.6–10.2)
Chloride: 100 mmol/L (ref 96–106)
Creatinine, Ser: 1.38 mg/dL — ABNORMAL HIGH (ref 0.76–1.27)
GFR calc Af Amer: 59 mL/min/{1.73_m2} — ABNORMAL LOW (ref >59)
GFR calc non Af Amer: 51 mL/min/{1.73_m2} — ABNORMAL LOW (ref >59)
GLUCOSE: 87 mg/dL (ref 65–99)
POTASSIUM: 3.9 mmol/L (ref 3.5–5.2)
SODIUM: 142 mmol/L (ref 134–144)

## 2017-05-27 ENCOUNTER — Telehealth: Payer: Self-pay | Admitting: Pharmacist

## 2017-05-27 NOTE — Telephone Encounter (Signed)
Received fax for clearance to hold anticoagulation for spinal injection on 06/22/17 with [facility]. Pt on Eliquis for Afib with CHADSVASC 4. He does have a history of hemorrhagic stroke in 2008. Per protocol ok to hold 3 days prior to procedure.   Clearance faxed back.

## 2017-05-30 NOTE — Telephone Encounter (Signed)
Fax received from [facility] stating that the pt has been approved for pt assistance with Eliquis. Approval good from 05/27/17-11/21/17.

## 2017-05-31 ENCOUNTER — Ambulatory Visit: Payer: Medicare Other | Admitting: Cardiology

## 2017-06-03 ENCOUNTER — Other Ambulatory Visit (HOSPITAL_COMMUNITY): Payer: Self-pay | Admitting: Nurse Practitioner

## 2017-06-03 ENCOUNTER — Telehealth: Payer: Self-pay | Admitting: Cardiology

## 2017-06-03 NOTE — Telephone Encounter (Signed)
**Note De-Identified  Obfuscation** The pt has been given 2 boxes of eliquis samples.

## 2017-06-03 NOTE — Telephone Encounter (Signed)
**Note De-Identified  Obfuscation** The pt has been given the phone # to call [facility] pt assistance to find out when he should expect a shipment of his Eliquis. He is aware to call us back if he will not have enough Eliquis to last until his Eliquis arrives and we will provide him enough samples until his Eliquis arrives.

## 2017-06-03 NOTE — Telephone Encounter (Signed)
Follow Up:   Returning your call from this morning.P

## 2017-06-21 DIAGNOSIS — R972 Elevated prostate specific antigen [PSA]: Secondary | ICD-10-CM | POA: Diagnosis not present

## 2017-06-21 DIAGNOSIS — N401 Enlarged prostate with lower urinary tract symptoms: Secondary | ICD-10-CM | POA: Diagnosis not present

## 2017-06-21 DIAGNOSIS — N138 Other obstructive and reflux uropathy: Secondary | ICD-10-CM | POA: Diagnosis not present

## 2017-06-22 ENCOUNTER — Other Ambulatory Visit: Payer: Self-pay | Admitting: Physician Assistant

## 2017-06-22 DIAGNOSIS — M47816 Spondylosis without myelopathy or radiculopathy, lumbar region: Secondary | ICD-10-CM | POA: Diagnosis not present

## 2017-06-22 DIAGNOSIS — M5136 Other intervertebral disc degeneration, lumbar region: Secondary | ICD-10-CM | POA: Diagnosis not present

## 2017-06-22 DIAGNOSIS — M5416 Radiculopathy, lumbar region: Secondary | ICD-10-CM | POA: Diagnosis not present

## 2017-07-22 ENCOUNTER — Encounter: Payer: Self-pay | Admitting: Family Medicine

## 2017-07-22 ENCOUNTER — Ambulatory Visit (INDEPENDENT_AMBULATORY_CARE_PROVIDER_SITE_OTHER): Payer: Medicare Other | Admitting: Family Medicine

## 2017-07-22 VITALS — BP 136/89 | HR 88 | Temp 98.2°F | Resp 16 | Ht 69.0 in | Wt 213.0 lb

## 2017-07-22 DIAGNOSIS — H6121 Impacted cerumen, right ear: Secondary | ICD-10-CM

## 2017-07-22 NOTE — Progress Notes (Signed)
   Subjective:    Patient ID: Patrick Roth, male    DOB: 1946-05-16, 71 y.o.   MRN: [id number]  HPI Cerumen impaction- R sided.  Pt attempted to remove w/ H2O2 and warm water but this seemed to worsen the issue.  + decreased hearing.  Some discomfort.  No fevers.  No drainage.  No bleeding.   Review of Systems For ROS see HPI     Objective:   Physical Exam  Constitutional: He is oriented to person, place, and time. He appears well-developed and well-nourished. No distress.  HENT:  Head: Normocephalic and atraumatic.  L TM WNL R TM obscured by wax.  Unable to curette due to the fact the wax is up against the TM.  Ear was irrigated successfully w/ large amount of wax removed  Neurological: He is alert and oriented to person, place, and time.  Psychiatric: He has a normal mood and affect. His behavior is normal. Thought content normal.  Vitals reviewed.         Assessment & Plan:  Cerumen impaction- area was cleared using irrigation as it was too close to TM to curette.  Pt tolerated procedure w/o difficulty and was happy w/ results.

## 2017-07-22 NOTE — Progress Notes (Signed)
Pre visit review using our clinic review tool, if applicable. No additional management support is needed unless otherwise documented below in the visit note. 

## 2017-07-22 NOTE — Patient Instructions (Addendum)
Follow up w/ [name] as scheduled Once the water drains out, the hearing should improve Avoid Qtips or anything that can push the wax backwards Call with any questions or concerns Happy Labor Day!

## 2017-08-02 NOTE — Progress Notes (Signed)
Subjective:   Patrick Roth is a 71 y.o. male who presents for Medicare Annual/Subsequent preventive examination.  Review of Systems:  No ROS.  Medicare Wellness Visit. Additional risk factors are reflected in the social history.  Cardiac Risk Factors include: advanced age (>68men, >51 women);dyslipidemia;hypertension;family history of premature cardiovascular disease;male gender;obesity (BMI >30kg/m2) Sleep patterns: Sleeps well. Up to void x 2.   Home Safety/Smoke Alarms: Feels safe in home. Smoke alarms in place.  Living environment; residence and Firearm Safety: Lives with wife and daughter in 28 story home with basement.  Seat Belt Safety/Bike Helmet: Wears seat belt.    Male:   CCS-Colonoscopy 10/26/2010, normal. Recall 10 years.      PSA- Followed by Urology.       Objective:    Vitals: BP (!) 182/100 (BP Location: Left Arm, Patient Position: Sitting, Cuff Size: Normal)   Pulse 60   Temp 98 F (36.7 C) (Temporal)   Resp 18   Ht 5\' 9"  (1.753 m)   Wt 213 lb 6.4 oz (96.8 kg)   SpO2 98%   BMI 31.51 kg/m   Body mass index is 31.51 kg/m.  Tobacco History  Smoking Status  . Never Smoker  Smokeless Tobacco  . Never Used     Counseling given: Not Answered   Past Medical History:  Diagnosis Date  . Colon polyps   . History of chicken pox   . Hyperlipidemia   . Hypertension   . Internal hemorrhoids   . Irregular heartbeat   . Sciatica   . Stroke [facility]) 2008   Past Surgical History:  Procedure Laterality Date  . COLONOSCOPY  2011  . great toe surgery    . PROSTATE BIOPSY     Family History  Problem Relation Age of Onset  . Hypertension Father 77       Deceased  . Heart disease Father   . Lung cancer Mother 60       Deceased  . Healthy Sister        x3  . Healthy Brother        x4  . Heart disease Brother        #5  . Other Daughter        Alpha Thalassemia  . Lupus Daughter        #2-deceased  . Diabetes Neg Hx   . Heart attack Neg Hx   .  Hyperlipidemia Neg Hx   . Sudden death Neg Hx    History  Sexual Activity  . Sexual activity: Not on file    Outpatient Encounter Prescriptions as of 08/03/2017  Medication Sig  . aspirin 81 MG tablet Take 81 mg by mouth daily.  . chlorthalidone (HYGROTON) 25 MG tablet TAKE 1/2 (ONE-HALF) TABLET BY MOUTH ONCE DAILY  . ELIQUIS 5 MG TABS tablet TAKE 1 TABLET BY MOUTH TWICE DAILY  . gabapentin (NEURONTIN) 100 MG capsule Take 1 capsule in the morning, 1 capsule in the afternoon and 3 capsules in the evening (Patient taking differently: Take 100-300 mg by mouth 2 (two) times daily. Take 1 capsule in the morning and 3 capsules in the evening)  . KLOR-CON M20 20 MEQ tablet TAKE ONE TABLET BY MOUTH ONCE DAILY  . metoprolol tartrate (LOPRESSOR) 25 MG tablet Take 1.5 tablets (37.5 mg total) by mouth 2 (two) times daily.   No facility-administered encounter medications on file as of 08/03/2017.     Activities of Daily Living In your present state of  health, do you have any difficulty performing the following activities: 08/03/2017 11/30/2016  Hearing? N N  Vision? N N  Difficulty concentrating or making decisions? N N  Walking or climbing stairs? N N  Dressing or bathing? N N  Doing errands, shopping? N N  Preparing Food and eating ? N -  Using the Toilet? N -  In the past six months, have you accidently leaked urine? N -  Do you have problems with loss of bowel control? N -  Managing your Medications? N -  Managing your Finances? N -  Housekeeping or managing your Housekeeping? N -  Some recent data might be hidden    Patient Care Team: Delorse Limber as PCP - General (Physician Assistant) Irene Shipper, MD as Consulting Physician (Gastroenterology) [name], Wonda Cheng, MD as Consulting Physician (Cardiology) Christy Sartorius, MD as Referring Physician (Urology) Inocencio Homes, DPM as Consulting Physician (Podiatry) Constance Haw, MD as Consulting Physician  (Cardiology) Myrle Sheng, MD as Referring Physician (Neurosurgery)   Assessment:    Physical assessment deferred to PCP.  Exercise Activities and Dietary recommendations Current Exercise Habits: The patient does not participate in regular exercise at present (Up and down steps to basement; yard work), Exercise limited by: None identified Diet (meal preparation, eat out, water intake, caffeinated beverages, dairy products, fruits and vegetables): Drinks water.   Breakfast: cereal; waffles; oatmeal Lunch: skips; fruit Dinner: protein and vegetables   Goals      Patient Stated   . patient (pt-stated)          Maintain current health by staying active.       Fall Risk Fall Risk  08/03/2017 11/30/2016 01/29/2016  Falls in the past year? No No No   Depression Screen PHQ 2/9 Scores 08/03/2017 11/30/2016 11/30/2016 01/29/2016  PHQ - 2 Score 0 0 0 0  PHQ- 9 Score - - 0 -    Cognitive Function MMSE - Mini Mental State Exam 08/03/2017  Orientation to time 5  Orientation to Place 5  Registration 3  Attention/ Calculation 2  Recall 3  Language- name 2 objects 2  Language- repeat 1  Language- follow 3 step command 3  Language- read & follow direction 1  Write a sentence 1  Copy design 1  Total score 27        Immunization History  Administered Date(s) Administered  . Influenza,inj,Quad PF,6+ Mos 07/23/2015  . Tdap 07/05/2013   Screening Tests Health Maintenance  Topic Date Due  . INFLUENZA VACCINE  08/22/2017 (Originally 06/22/2017)  . Hepatitis C Screening  11/02/2017 (Originally [birth year])  . PNA vac Low Risk Adult (1 of 2 - PCV13) 12/14/2017 (Originally 02/03/2010)  . COLONOSCOPY  10/26/2020  . DTaP/Tdap/Td (2 - Td) 07/06/2023  . TETANUS/TDAP  07/06/2023      Plan:    Bring a copy of your living will and/or healthcare power of attorney to your next office visit.  Continue doing brain stimulating activities (puzzles, reading, adult coloring books, staying active)  to keep memory sharp.    I have personally reviewed and noted the following in the patient's chart:   . Medical and social history . Use of alcohol, tobacco or illicit drugs  . Current medications and supplements . Functional ability and status . Nutritional status . Physical activity . Advanced directives . List of other physicians . Hospitalizations, surgeries, and ER visits in previous 12 months . Vitals . Screenings to include cognitive, depression, and falls .  Referrals and appointments  In addition, I have reviewed and discussed with patient certain preventive protocols, quality metrics, and best practice recommendations. A written personalized care plan for preventive services as well as general preventive health recommendations were provided to patient.     Gerilyn Nestle, RN  08/03/2017

## 2017-08-02 NOTE — Progress Notes (Signed)
Patient presents to clinic today for annual exam.  Patient has had AWV with Maudie Mercury. Patient is fasting for labs.  Acute Concerns: Denies acute concerns today.   Chronic Issues: Hypertension -- Patient is currently on a regimen of chlorthalidone 12.5 mg dail. Has history of noncompliance but endorses taking his medication as directed. Does note take his Metoprolol as directed. Endorses good BP measurements at home. States morning BP are higher but return to normal after he urinates. Patient denies chest pain, palpitations, lightheadedness, dizziness, vision changes or frequent headaches.  BP Readings from Last 3 Encounters:  08/03/17 (!) 160/96  07/22/17 136/89  05/24/17 138/84   Hyperlipidemia -- Patient currently not on any medication as he is unable to tolerate statins and had a "bad reaction" to Zetia previously. Is watching a low-fat/cholesterol diet and endorses exercising throughout the week.  Atrial Fibrillation -- Followed by Cardiology. Is currently on a regimen of Eliquis BID Is taking as directed.   Health Maintenance: Immunizations -- Declines all immunizations Colonoscopy -- up-to-date  Past Medical History:  Diagnosis Date  . Colon polyps   . History of chicken pox   . Hyperlipidemia   . Hypertension   . Internal hemorrhoids   . Irregular heartbeat   . Sciatica   . Stroke [facility]) 2008    Past Surgical History:  Procedure Laterality Date  . COLONOSCOPY  2011  . great toe surgery    . PROSTATE BIOPSY      Current Outpatient Prescriptions on File Prior to Visit  Medication Sig Dispense Refill  . aspirin 81 MG tablet Take 81 mg by mouth daily.    . chlorthalidone (HYGROTON) 25 MG tablet TAKE 1/2 (ONE-HALF) TABLET BY MOUTH ONCE DAILY 90 tablet 0  . ELIQUIS 5 MG TABS tablet TAKE 1 TABLET BY MOUTH TWICE DAILY 60 tablet 0  . gabapentin (NEURONTIN) 100 MG capsule Take 1 capsule in the morning, 1 capsule in the afternoon and 3 capsules in the evening (Patient taking  differently: Take 100-300 mg by mouth 2 (two) times daily. Take 1 capsule in the morning and 3 capsules in the evening) 150 capsule 3  . KLOR-CON M20 20 MEQ tablet TAKE ONE TABLET BY MOUTH ONCE DAILY 90 tablet 0   No current facility-administered medications on file prior to visit.     Allergies  Allergen Reactions  . Crestor [Rosuvastatin Calcium] Shortness Of Breath    Muscle cramps  . Spironolactone Shortness Of Breath  . Terazosin Shortness Of Breath, Palpitations and Other (See Comments)    Nerve pain  . Atenolol Other (See Comments)    Drowsiness and "flu sxs"  . Bystolic [Nebivolol Hcl] Other (See Comments)    GI issues  . Hydralazine Other (See Comments)    Joint swelling  . Tizanidine Other (See Comments)    Syncope, Elevated BP/pulse  . Amlodipine Other (See Comments)  . Clonidine Other (See Comments)  . Hydrocodone-Acetaminophen Other (See Comments)  . Levofloxacin Other (See Comments)  . Losartan Other (See Comments)  . Maxzide [Hydrochlorothiazide W-Triamterene] Other (See Comments)    Does not tolerate potassium-sparing diuretics  . Olmesartan Other (See Comments)  . Tussionex Pennkinetic Er [Hydrocod Polst-Cpm Polst Er] Other (See Comments)    Other reaction(s): Unknown    Family History  Problem Relation Age of Onset  . Hypertension Father 25       Deceased  . Heart disease Father   . Lung cancer Mother 8  Deceased  . Healthy Sister        x3  . Healthy Brother        x4  . Heart disease Brother        #5  . Other Daughter        Alpha Thalassemia  . Lupus Daughter        #2-deceased  . Diabetes Neg Hx   . Heart attack Neg Hx   . Hyperlipidemia Neg Hx   . Sudden death Neg Hx     Social History   Social History  . Marital status: Married    Spouse name: N/A  . Number of children: 1  . Years of education: 58   Occupational History  . Retired    Social History Main Topics  . Smoking status: Never Smoker  . Smokeless tobacco:  Never Used  . Alcohol use No  . Drug use: No  . Sexual activity: Not on file   Other Topics Concern  . Not on file   Social History Narrative   Fun: Play golf and build furniture.    Review of Systems  Constitutional: Negative for fever and weight loss.  HENT: Negative for ear discharge, ear pain, hearing loss and tinnitus.   Eyes: Negative for blurred vision, double vision, photophobia and pain.  Respiratory: Negative for cough and shortness of breath.   Cardiovascular: Negative for chest pain and palpitations.  Gastrointestinal: Negative for abdominal pain, blood in stool, constipation, diarrhea, heartburn, melena, nausea and vomiting.  Genitourinary: Negative for dysuria, flank pain, frequency, hematuria and urgency.  Musculoskeletal: Negative for falls.  Neurological: Negative for dizziness, loss of consciousness and headaches.  Endo/Heme/Allergies: Negative for environmental allergies.  Psychiatric/Behavioral: Negative for depression, hallucinations, substance abuse and suicidal ideas. The patient is not nervous/anxious and does not have insomnia.    BP (!) 160/96   Pulse 60   Temp 98 F (36.7 C) (Temporal)   Resp 18   Ht 5\' 9"  (1.753 m)   Wt 213 lb 6.4 oz (96.8 kg)   SpO2 98%   BMI 31.51 kg/m   Physical Exam  Constitutional: He is oriented to person, place, and time and well-developed, well-nourished, and in no distress.  HENT:  Head: Normocephalic and atraumatic.  Right Ear: External ear normal.  Left Ear: External ear normal.  Nose: Nose normal.  Mouth/Throat: Oropharynx is clear and moist. No oropharyngeal exudate.  TM within normal limits bilaterally.  Eyes: Conjunctivae are normal.  Neck: Neck supple.  Cardiovascular: Normal rate, regular rhythm, normal heart sounds and intact distal pulses.   Pulmonary/Chest: Effort normal. No respiratory distress. He has no wheezes. He has no rales. He exhibits no tenderness.  Neurological: He is alert and oriented to  person, place, and time.  Skin: Skin is warm and dry. No rash noted.  Psychiatric: Affect normal.  Vitals reviewed.  Recent Results (from the past 2160 hour(s))  Basic metabolic panel     Status: Abnormal   Collection Time: 05/24/17 12:13 PM  Result Value Ref Range   Glucose 87 65 - 99 mg/dL   BUN 17 8 - 27 mg/dL   Creatinine, Ser 1.38 (H) 0.76 - 1.27 mg/dL   GFR calc non Af Amer 51 (L) >59 mL/min/1.73   GFR calc Af Amer 59 (L) >59 mL/min/1.73   BUN/Creatinine Ratio 12 10 - 24   Sodium 142 134 - 144 mmol/L   Potassium 3.9 3.5 - 5.2 mmol/L   Chloride 100 96 - 106  mmol/L   CO2 27 20 - 29 mmol/L    Comment:               **Please note reference interval change**   Calcium 9.5 8.6 - 10.2 mg/dL  Comprehensive metabolic panel     Status: Abnormal   Collection Time: 08/03/17 10:12 AM  Result Value Ref Range   Sodium 138 135 - 145 mEq/L   Potassium 3.8 3.5 - 5.1 mEq/L   Chloride 99 96 - 112 mEq/L   CO2 32 19 - 32 mEq/L   Glucose, Bld 90 70 - 99 mg/dL   BUN 14 6 - 23 mg/dL   Creatinine, Ser 1.27 0.40 - 1.50 mg/dL   Total Bilirubin 1.3 (H) 0.2 - 1.2 mg/dL   Alkaline Phosphatase 73 39 - 117 U/L   AST 21 0 - 37 U/L   ALT 18 0 - 53 U/L   Total Protein 6.9 6.0 - 8.3 g/dL   Albumin 4.1 3.5 - 5.2 g/dL   Calcium 9.4 8.4 - 10.5 mg/dL   GFR 71.59 >60.00 mL/min  CBC with Differential/Platelet     Status: Abnormal   Collection Time: 08/03/17 10:12 AM  Result Value Ref Range   WBC 4.3 4.0 - 10.5 K/uL   RBC 5.70 4.22 - 5.81 Mil/uL   Hemoglobin 16.4 13.0 - 17.0 g/dL   HCT 50.9 39.0 - 52.0 %   MCV 89.3 78.0 - 100.0 fl   MCHC 32.2 30.0 - 36.0 g/dL   RDW 14.5 11.5 - 15.5 %   Platelets 164.0 150.0 - 400.0 K/uL   Neutrophils Relative % 56.4 43.0 - 77.0 %   Lymphocytes Relative 28.4 12.0 - 46.0 %   Monocytes Relative 13.6 (H) 3.0 - 12.0 %   Eosinophils Relative 1.0 0.0 - 5.0 %   Basophils Relative 0.6 0.0 - 3.0 %   Neutro Abs 2.4 1.4 - 7.7 K/uL   Lymphs Abs 1.2 0.7 - 4.0 K/uL   Monocytes  Absolute 0.6 0.1 - 1.0 K/uL   Eosinophils Absolute 0.0 0.0 - 0.7 K/uL   Basophils Absolute 0.0 0.0 - 0.1 K/uL  Lipid panel     Status: Abnormal   Collection Time: 08/03/17 10:12 AM  Result Value Ref Range   Cholesterol 235 (H) 0 - 200 mg/dL    Comment: ATP III Classification       Desirable:  < 200 mg/dL               Borderline High:  200 - 239 mg/dL          High:  > = 240 mg/dL   Triglycerides 90.0 0.0 - 149.0 mg/dL    Comment: Normal:  <150 mg/dLBorderline High:  150 - 199 mg/dL   HDL 61.90 >39.00 mg/dL   VLDL 18.0 0.0 - 40.0 mg/dL   LDL Cholesterol 155 (H) 0 - 99 mg/dL   Total CHOL/HDL Ratio 4     Comment:                Men          Women1/2 Average Risk     3.4          3.3Average Risk          5.0          4.42X Average Risk          9.6          7.13X Average Risk  15.0          11.0                       NonHDL 173.45     Comment: NOTE:  Non-HDL goal should be 30 mg/dL higher than patient's LDL goal (i.e. LDL goal of < 70 mg/dL, would have non-HDL goal of < 100 mg/dL)   Assessment/Plan: Persistent atrial fibrillation (HCC) Taking Eliquis as directed by Cardiology now. Rate controlled. Continue Eliquis as directed. Follow-up with Cardiology as scheduled.   Essential hypertension BP above goal today. HR at low end of normal. Is not taking Metoprolol as directed. Asymptomatic. Somewhat improved on recheck. Will check BMP today. If renal function and electrolytes stable, will increase chlorthalidone. Will hold off on Metoprolol giving low heart rate and patient in NSR today.  Hyperlipemia Repeat labs today. Discussed dietary and exercise recommendations.     Leeanne Rio, PA-C

## 2017-08-03 ENCOUNTER — Ambulatory Visit (INDEPENDENT_AMBULATORY_CARE_PROVIDER_SITE_OTHER): Payer: Medicare Other | Admitting: Physician Assistant

## 2017-08-03 ENCOUNTER — Encounter: Payer: Self-pay | Admitting: Physician Assistant

## 2017-08-03 ENCOUNTER — Ambulatory Visit: Payer: Medicare Other

## 2017-08-03 VITALS — BP 160/96 | HR 60 | Temp 98.0°F | Resp 18 | Ht 69.0 in | Wt 213.4 lb

## 2017-08-03 DIAGNOSIS — Z0000 Encounter for general adult medical examination without abnormal findings: Secondary | ICD-10-CM

## 2017-08-03 DIAGNOSIS — I481 Persistent atrial fibrillation: Secondary | ICD-10-CM

## 2017-08-03 DIAGNOSIS — E785 Hyperlipidemia, unspecified: Secondary | ICD-10-CM | POA: Diagnosis not present

## 2017-08-03 DIAGNOSIS — I10 Essential (primary) hypertension: Secondary | ICD-10-CM

## 2017-08-03 DIAGNOSIS — I4819 Other persistent atrial fibrillation: Secondary | ICD-10-CM

## 2017-08-03 LAB — LIPID PANEL
Cholesterol: 235 mg/dL — ABNORMAL HIGH (ref 0–200)
HDL: 61.9 mg/dL (ref >39.00)
LDL CALC: 155 mg/dL — AB (ref 0–99)
NonHDL: 173.45
TRIGLYCERIDES: 90 mg/dL (ref 0.0–149.0)
Total CHOL/HDL Ratio: 4
VLDL: 18 mg/dL (ref 0.0–40.0)

## 2017-08-03 LAB — CBC WITH DIFFERENTIAL/PLATELET
BASOS PCT: 0.6 % (ref 0.0–3.0)
Basophils Absolute: 0 10*3/uL (ref 0.0–0.1)
EOS ABS: 0 10*3/uL (ref 0.0–0.7)
Eosinophils Relative: 1 % (ref 0.0–5.0)
HEMATOCRIT: 50.9 % (ref 39.0–52.0)
HEMOGLOBIN: 16.4 g/dL (ref 13.0–17.0)
LYMPHS PCT: 28.4 % (ref 12.0–46.0)
Lymphs Abs: 1.2 10*3/uL (ref 0.7–4.0)
MCHC: 32.2 g/dL (ref 30.0–36.0)
MCV: 89.3 fl (ref 78.0–100.0)
Monocytes Absolute: 0.6 10*3/uL (ref 0.1–1.0)
Monocytes Relative: 13.6 % — ABNORMAL HIGH (ref 3.0–12.0)
Neutro Abs: 2.4 10*3/uL (ref 1.4–7.7)
Neutrophils Relative %: 56.4 % (ref 43.0–77.0)
Platelets: 164 10*3/uL (ref 150.0–400.0)
RBC: 5.7 Mil/uL (ref 4.22–5.81)
RDW: 14.5 % (ref 11.5–15.5)
WBC: 4.3 10*3/uL (ref 4.0–10.5)

## 2017-08-03 LAB — COMPREHENSIVE METABOLIC PANEL
ALBUMIN: 4.1 g/dL (ref 3.5–5.2)
ALT: 18 U/L (ref 0–53)
AST: 21 U/L (ref 0–37)
Alkaline Phosphatase: 73 U/L (ref 39–117)
BUN: 14 mg/dL (ref 6–23)
CHLORIDE: 99 meq/L (ref 96–112)
CO2: 32 mEq/L (ref 19–32)
CREATININE: 1.27 mg/dL (ref 0.40–1.50)
Calcium: 9.4 mg/dL (ref 8.4–10.5)
GFR: 71.59 mL/min (ref >60.00)
Glucose, Bld: 90 mg/dL (ref 70–99)
POTASSIUM: 3.8 meq/L (ref 3.5–5.1)
Sodium: 138 mEq/L (ref 135–145)
Total Bilirubin: 1.3 mg/dL — ABNORMAL HIGH (ref 0.2–1.2)
Total Protein: 6.9 g/dL (ref 6.0–8.3)

## 2017-08-03 NOTE — Patient Instructions (Addendum)
Instructions from Me:  Please go to the lab for blood work.   Our office will call you with your results unless you have chosen to receive results via MyChart.  If your blood work is normal we will follow-up each year for physicals and as scheduled for chronic medical problems.  If anything is abnormal we will treat accordingly and get you in for a follow-up.  Please take Eliquis twice daily.  Make sure to limit fried or fatty foods.  I want to check potassium levels before adjusting the dose of Chlorthalidone and potassium. Keep a recording of your BP measurements at home. Bring to each visit.  Please follow-up with your Cardiologist as scheduled.    Instructions from Maudie Mercury: Bring a copy of your living will and/or healthcare power of attorney to your next office visit.  Continue doing brain stimulating activities (puzzles, reading, adult coloring books, staying active) to keep memory sharp.    Health Maintenance, Male A healthy lifestyle and preventive care is important for your health and wellness. Ask your health care provider about what schedule of regular examinations is right for you. What should I know about weight and diet? Eat a Healthy Diet  Eat plenty of vegetables, fruits, whole grains, low-fat dairy products, and lean protein.  Do not eat a lot of foods high in solid fats, added sugars, or salt.  Maintain a Healthy Weight Regular exercise can help you achieve or maintain a healthy weight. You should:  Do at least 150 minutes of exercise each week. The exercise should increase your heart rate and make you sweat (moderate-intensity exercise).  Do strength-training exercises at least twice a week.  Watch Your Levels of Cholesterol and Blood Lipids  Have your blood tested for lipids and cholesterol every 5 years starting at 72 years of age. If you are at high risk for heart disease, you should start having your blood tested when you are 72 years old. You may need to  have your cholesterol levels checked more often if: ? Your lipid or cholesterol levels are high. ? You are older than 72 years of age. ? You are at high risk for heart disease.  What should I know about cancer screening? Many types of cancers can be detected early and may often be prevented. Lung Cancer  You should be screened every year for lung cancer if: ? You are a current smoker who has smoked for at least 30 years. ? You are a former smoker who has quit within the past 15 years.  Talk to your health care provider about your screening options, when you should start screening, and how often you should be screened.  Colorectal Cancer  Routine colorectal cancer screening usually begins at 72 years of age and should be repeated every 5-10 years until you are 72 years old. You may need to be screened more often if early forms of precancerous polyps or small growths are found. Your health care provider may recommend screening at an earlier age if you have risk factors for colon cancer.  Your health care provider may recommend using home test kits to check for hidden blood in the stool.  A small camera at the end of a tube can be used to examine your colon (sigmoidoscopy or colonoscopy). This checks for the earliest forms of colorectal cancer.  Prostate and Testicular Cancer  Depending on your age and overall health, your health care provider may do certain tests to screen for prostate and testicular cancer.  Talk to your health care provider about any symptoms or concerns you have about testicular or prostate cancer.  Skin Cancer  Check your skin from head to toe regularly.  Tell your health care provider about any new moles or changes in moles, especially if: ? There is a change in a mole's size, shape, or color. ? You have a mole that is larger than a pencil eraser.  Always use sunscreen. Apply sunscreen liberally and repeat throughout the day.  Protect yourself by wearing  long sleeves, pants, a wide-brimmed hat, and sunglasses when outside.  What should I know about heart disease, diabetes, and high blood pressure?  If you are 43-49 years of age, have your blood pressure checked every 3-5 years. If you are 93 years of age or older, have your blood pressure checked every year. You should have your blood pressure measured twice-once when you are at a hospital or clinic, and once when you are not at a hospital or clinic. Record the average of the two measurements. To check your blood pressure when you are not at a hospital or clinic, you can use: ? An automated blood pressure machine at a pharmacy. ? A home blood pressure monitor.  Talk to your health care provider about your target blood pressure.  If you are between 61-33 years old, ask your health care provider if you should take aspirin to prevent heart disease.  Have regular diabetes screenings by checking your fasting blood sugar level. ? If you are at a normal weight and have a low risk for diabetes, have this test once every three years after the age of 7. ? If you are overweight and have a high risk for diabetes, consider being tested at a younger age or more often.  A one-time screening for abdominal aortic aneurysm (AAA) by ultrasound is recommended for men aged 15-75 years who are current or former smokers. What should I know about preventing infection? Hepatitis B If you have a higher risk for hepatitis B, you should be screened for this virus. Talk with your health care provider to find out if you are at risk for hepatitis B infection. Hepatitis C Blood testing is recommended for:  Everyone born from [birth year] through 1966.  Anyone with known risk factors for hepatitis C.  Sexually Transmitted Diseases (STDs)  You should be screened each year for STDs including gonorrhea and chlamydia if: ? You are sexually active and are younger than 72 years of age. ? You are older than 72 years of age and your  health care provider tells you that you are at risk for this type of infection. ? Your sexual activity has changed since you were last screened and you are at an increased risk for chlamydia or gonorrhea. Ask your health care provider if you are at risk.  Talk with your health care provider about whether you are at high risk of being infected with HIV. Your health care provider may recommend a prescription medicine to help prevent HIV infection.  What else can I do?  Schedule regular health, dental, and eye exams.  Stay current with your vaccines (immunizations).  Do not use any tobacco products, such as cigarettes, chewing tobacco, and e-cigarettes. If you need help quitting, ask your health care provider.  Limit alcohol intake to no more than 2 drinks per day. One drink equals 12 ounces of beer, 5 ounces of wine, or 1 ounces of hard liquor.  Do not use street drugs.  Do  not share needles.  Ask your health care provider for help if you need support or information about quitting drugs.  Tell your health care provider if you often feel depressed.  Tell your health care provider if you have ever been abused or do not feel safe at home. This information is not intended to replace advice given to you by your health care provider. Make sure you discuss any questions you have with your health care provider. Document Released: 05/06/2008 Document Revised: 07/07/2016 Document Reviewed: 08/12/2015 Elsevier Interactive Patient Education  [employer].

## 2017-08-17 NOTE — Assessment & Plan Note (Signed)
Taking Eliquis as directed by Cardiology now. Rate controlled. Continue Eliquis as directed. Follow-up with Cardiology as scheduled.

## 2017-08-17 NOTE — Assessment & Plan Note (Signed)
Repeat labs today. Discussed dietary and exercise recommendations.

## 2017-08-17 NOTE — Assessment & Plan Note (Signed)
BP above goal today. HR at low end of normal. Is not taking Metoprolol as directed. Asymptomatic. Somewhat improved on recheck. Will check BMP today. If renal function and electrolytes stable, will increase chlorthalidone. Will hold off on Metoprolol giving low heart rate and patient in NSR today.

## 2017-08-24 ENCOUNTER — Ambulatory Visit: Payer: Medicare Other | Admitting: Cardiology

## 2017-08-25 ENCOUNTER — Encounter: Payer: Self-pay | Admitting: Cardiology

## 2017-08-25 ENCOUNTER — Ambulatory Visit (INDEPENDENT_AMBULATORY_CARE_PROVIDER_SITE_OTHER): Payer: Medicare Other | Admitting: Cardiology

## 2017-08-25 VITALS — BP 140/102 | HR 126 | Ht 68.0 in | Wt 214.4 lb

## 2017-08-25 DIAGNOSIS — I481 Persistent atrial fibrillation: Secondary | ICD-10-CM

## 2017-08-25 DIAGNOSIS — I10 Essential (primary) hypertension: Secondary | ICD-10-CM | POA: Diagnosis not present

## 2017-08-25 DIAGNOSIS — I4819 Other persistent atrial fibrillation: Secondary | ICD-10-CM

## 2017-08-25 MED ORDER — CARVEDILOL 6.25 MG PO TABS: 6.2500 mg | ORAL_TABLET | Freq: Two times a day (BID) | ORAL | 3 refills | Status: DC

## 2017-08-25 NOTE — Patient Instructions (Addendum)
Medication Instructions:  Your physician has recommended you make the following change in your medication:  1.) Start Coreg 6.25mg  daily.    -- If you need a refill on your cardiac medications before your next appointment, please call your pharmacy. --  Labwork: None ordered  Testing/Procedures: Your physician has recommended that you have a Cardioversion (DCCV). Electrical Cardioversion uses a jolt of electricity to your heart either through paddles or wired patches attached to your chest. This is a controlled, usually prescheduled, procedure. Defibrillation is done under light anesthesia in the hospital, and you usually go home the day of the procedure. This is done to get your heart back into a normal rhythm. You are not awake for the procedure.   Please call office when ready to schedule procedure.    Follow-Up: Your physician wants you to follow-up in: 3 months with Dr. [name].  You will receive a reminder letter in the mail two months in advance. If you don't receive a letter, please call our office to schedule the follow-up appointment.  Thank you for choosing [facility]!!   Trinidad Curet, RN [phone]  Any Other Special Instructions Will Be Listed Below (If Applicable).   Electrical Cardioversion Electrical cardioversion is the delivery of a jolt of electricity to restore a normal rhythm to the heart. A rhythm that is too fast or is not regular keeps the heart from pumping well. In this procedure, sticky patches or metal paddles are placed on the chest to deliver electricity to the heart from a device. This procedure may be done in an emergency if:  There is low or no blood pressure as a result of the heart rhythm.  Normal rhythm must be restored as fast as possible to protect the brain and heart from further damage.  It may save a life.  This procedure may also be done for irregular or fast heart rhythms that are not immediately life-threatening. Tell a  health care provider about:  Any allergies you have.  All medicines you are taking, including vitamins, herbs, eye drops, creams, and over-the-counter medicines.  Any problems you or family members have had with anesthetic medicines.  Any blood disorders you have.  Any surgeries you have had.  Any medical conditions you have.  Whether you are pregnant or may be pregnant. What are the risks? Generally, this is a safe procedure. However, problems may occur, including:  Allergic reactions to medicines.  A blood clot that breaks free and travels to other parts of your body.  The possible return of an abnormal heart rhythm within hours or days after the procedure.  Your heart stopping (cardiac arrest). This is rare.  What happens before the procedure? Medicines  Your health care provider may have you start taking: ? Blood-thinning medicines (anticoagulants) so your blood does not clot as easily. ? Medicines may be given to help stabilize your heart rate and rhythm.  Ask your health care provider about changing or stopping your regular medicines. This is especially important if you are taking diabetes medicines or blood thinners. General instructions  Plan to have someone take you home from the hospital or clinic.  If you will be going home right after the procedure, plan to have someone with you for 24 hours.  Follow instructions from your health care provider about eating or drinking restrictions. What happens during the procedure?  To lower your risk of infection: ? Your health care team will wash or sanitize their hands. ? Your skin will  be washed with soap.  An IV tube will be inserted into one of your veins.  You will be given a medicine to help you relax (sedative).  Sticky patches (electrodes) or metal paddles may be placed on your chest.  An electrical shock will be delivered. The procedure may vary among health care providers and hospitals. What happens  after the procedure?  Your blood pressure, heart rate, breathing rate, and blood oxygen level will be monitored until the medicines you were given have worn off.  Do not drive for 24 hours if you were given a sedative.  Your heart rhythm will be watched to make sure it does not change. This information is not intended to replace advice given to you by your health care provider. Make sure you discuss any questions you have with your health care provider. Document Released: 10/29/2002 Document Revised: 07/07/2016 Document Reviewed: 05/14/2016 Elsevier Interactive Patient Education  2017 [employer].

## 2017-08-25 NOTE — Progress Notes (Signed)
Electrophysiology Office Note   Date:  08/25/2017   ID:  GERARD CANTARA, DOB 07-09-1946, MRN [id number]  PCP:  Brunetta Jeans, PA-C  Cardiologist:  [name] Primary Electrophysiologist:  Shyan Scalisi Meredith Leeds, MD    Chief Complaint  Patient presents with  . Follow-up    Persistent Afib     History of Present Illness: Patrick Roth is a 71 y.o. male who presents today for electrophysiology evaluation.   Hx of HTN and multiple drug intolerances, HL, prior CVA. He was admitted in 7/17 with syncope felt to be 2/2 orthostatic hypotension. He was found to be in new onset AF with RVR. He has been asymptomatic. He was started on Metoprolol Tartrate 25 bid and Xarelto for anticoagulation. He says that at times, he gets weak and short of breath. He says that it feels like his legs are full of bricks. Prior to his diagnosis with atrial fibrillation, he went to his primary physician to see if this could be a heart abnormality, but his EKGs were normal sinus.   Today, denies symptoms of palpitations, chest pain, orthopnea, PND, lower extremity edema, claudication, dizziness, presyncope, syncope, bleeding, or neurologic sequela. The patient is tolerating medications without difficulties. He is having some shortness of breath and fatigue. This mainly occurs when he is walking to the mailbox in the morning. EKG today shows atrial flutter with a heart rate of 108. He did have an illness a few weeks ago, and he says that his heart rate has been fast since that time.    Past Medical History:  Diagnosis Date  . Colon polyps   . History of chicken pox   . Hyperlipidemia   . Hypertension   . Internal hemorrhoids   . Irregular heartbeat   . Sciatica   . Stroke [facility]) 2008   Past Surgical History:  Procedure Laterality Date  . COLONOSCOPY  2011  . great toe surgery    . PROSTATE BIOPSY       Current Outpatient Prescriptions  Medication Sig Dispense Refill  . aspirin 81 MG tablet Take 81 mg by  mouth daily.    . chlorthalidone (HYGROTON) 25 MG tablet TAKE 1/2 (ONE-HALF) TABLET BY MOUTH ONCE DAILY 90 tablet 0  . ELIQUIS 5 MG TABS tablet TAKE 1 TABLET BY MOUTH TWICE DAILY 60 tablet 0  . gabapentin (NEURONTIN) 100 MG capsule Take 1 capsule in the morning, 1 capsule in the afternoon and 3 capsules in the evening (Patient taking differently: Take 100-300 mg by mouth 2 (two) times daily. Take 1 capsule in the morning and 3 capsules in the evening) 150 capsule 3  . KLOR-CON M20 20 MEQ tablet TAKE ONE TABLET BY MOUTH ONCE DAILY 90 tablet 0  . carvedilol (COREG) 6.25 MG tablet Take 1 tablet (6.25 mg total) by mouth 2 (two) times daily. 180 tablet 3   No current facility-administered medications for this visit.     Allergies:   Crestor [rosuvastatin calcium]; Spironolactone; Terazosin; Atenolol; Bystolic [nebivolol hcl]; Hydralazine; Tizanidine; Amlodipine; Clonidine; Hydrocodone-acetaminophen; Levofloxacin; Losartan; Maxzide [hydrochlorothiazide w-triamterene]; Olmesartan; and Tussionex pennkinetic er [hydrocod polst-cpm polst er]   Social History:  The patient  reports that he has never smoked. He has never used smokeless tobacco. He reports that he does not drink alcohol or use drugs.   Family History:  The patient's family history includes Healthy in his brother and sister; Heart disease in his brother and father; Hypertension (age of onset: 16) in his father; Lung cancer (  age of onset: 23) in his mother; Lupus in his daughter; Other in his daughter.    ROS:  Please see the history of present illness.   Otherwise, review of systems is positive for back pain, leg pain.   All other systems are reviewed and negative.   PHYSICAL EXAM: VS:  BP (!) 140/102   Pulse (!) 126   Ht 5\' 8"  (1.727 m)   Wt 214 lb 6.4 oz (97.3 kg)   SpO2 98%   BMI 32.60 kg/m  , BMI Body mass index is 32.6 kg/m. GEN: Well nourished, well developed, in no acute distress  HEENT: normal  Neck: no JVD, carotid bruits,  or masses Cardiac: RRR; no murmurs, rubs, or gallops,no edema  Respiratory:  clear to auscultation bilaterally, normal work of breathing GI: soft, nontender, nondistended, + BS MS: no deformity or atrophy  Skin: warm and dry Neuro:  Strength and sensation are intact Psych: euthymic mood, full affect  EKG:  EKG is ordered today. Personal review of the ekg ordered shows atrial flutter, rate 108  Recent Labs: 04/23/2017: TSH 0.998 08/03/2017: ALT 18; BUN 14; Creatinine, Ser 1.27; Hemoglobin 16.4; Platelets 164.0; Potassium 3.8; Sodium 138    Lipid Panel     Component Value Date/Time   CHOL 235 (H) 08/03/2017 1012   TRIG 90.0 08/03/2017 1012   HDL 61.90 08/03/2017 1012   CHOLHDL 4 08/03/2017 1012   VLDL 18.0 08/03/2017 1012   LDLCALC 155 (H) 08/03/2017 1012     Wt Readings from Last 3 Encounters:  08/25/17 214 lb 6.4 oz (97.3 kg)  08/03/17 213 lb 6.4 oz (96.8 kg)  07/22/17 213 lb (96.6 kg)      Other studies Reviewed: Additional studies/ records that were reviewed today include: TTE 10/01/16  Review of the above records today demonstrates:  - Left ventricle: The cavity size was normal. Systolic function was   normal. The estimated ejection fraction was in the range of 55%   to 60%. Wall motion was normal; there were no regional wall   motion abnormalities. Doppler parameters are consistent with a   reversible restrictive pattern, indicative of decreased left   ventricular diastolic compliance and/or increased left atrial   pressure (grade 3 diastolic dysfunction). Doppler parameters are   consistent with high ventricular filling pressure. - Aortic valve: Transvalvular velocity was within the normal range.   There was no stenosis. - Mitral valve: Transvalvular velocity was within the normal range.   There was no evidence for stenosis. There was mild regurgitation. - Right ventricle: The cavity size was mildly dilated. Wall   thickness was normal. Systolic function was  normal. - Atrial septum: No defect or patent foramen ovale was identified   by color flow Doppler. - Tricuspid valve: There was mild regurgitation. - Pulmonary arteries: Systolic pressure was moderately to severely   increased. PA peak pressure: 57 mm Hg (S). - Pericardium, extracardiac: A trivial pericardial effusion was   identified.  Holter 05/12/17 - personally reviewed Minimum HR: 37 BPM at 12:18:54 PM Maximum HR: 124 BPM at 7:11:26 PM Average HR: 50 BPM 1% ventricular beats 4% supraventricular beats Ventricular runs of 6 beats Supraventricular runs of 13 beats  ASSESSMENT AND PLAN:  1.  Paroxysmal atrial fibrillation/atrial flutter: He was put on Eliquis that his last visit and has been compliant. Is having episodes of fatigue and shortness of breath, and is in atrial flutter today. We'll plan for cardioversion out of atrial flutter. We'll start him on  carvedilol 6.25 mg today. We'll plan for Multaq post cardioversion. I did discuss with him the possibility of ablation in the future. He would like to think about this.  This patients CHA2DS2-VASc Score and unadjusted Ischemic Stroke Rate (% per year) is equal to 3.2 % stroke rate/year from a score of 3  Above score calculated as 1 point each if present [CHF, HTN, DM, Vascular=MI/PAD/Aortic Plaque, Age if 65-74, or Male] Above score calculated as 2 points each if present [Age > 75, or Stroke/TIA/TE]   2. Hypertension: Blood pressure is elevated today in clinic. We'll plan to start carvedilol 6.25 mg twice a day for both heart rate and blood pressure control.one  Current medicines are reviewed at length with the patient today.   The patient does not have concerns regarding his medicines.  The following changes were made today:  n  Labs/ tests ordered today include:  Orders Placed This Encounter  Procedures  . EKG 12-Lead     Disposition:   FU with Itzamar Traynor 6 months  Signed, Inis Borneman Meredith Leeds, MD  08/25/2017 10:32  AM     [facility] [address] [place] [address] [phone] (office) [phone] (fax)

## 2017-08-26 ENCOUNTER — Telehealth: Payer: Self-pay | Admitting: Physician Assistant

## 2017-08-26 NOTE — Telephone Encounter (Signed)
Patient states our office was supposed to set up an appt for him to see a neurosurgeon, Dr. Katherine Roan in [place].  Pt requesting call back with status of.  I did not see a recent referral in system for patient to see this provider.  Please advise.

## 2017-08-29 ENCOUNTER — Telehealth: Payer: Self-pay | Admitting: Emergency Medicine

## 2017-08-29 ENCOUNTER — Encounter: Payer: Self-pay | Admitting: *Deleted

## 2017-08-29 ENCOUNTER — Other Ambulatory Visit: Payer: Self-pay | Admitting: Physician Assistant

## 2017-08-29 ENCOUNTER — Telehealth: Payer: Self-pay | Admitting: Cardiology

## 2017-08-29 DIAGNOSIS — M5416 Radiculopathy, lumbar region: Secondary | ICD-10-CM

## 2017-08-29 MED ORDER — DRONEDARONE HCL 400 MG PO TABS: 400.0000 mg | ORAL_TABLET | Freq: Two times a day (BID) | ORAL | 4 refills | Status: DC

## 2017-08-29 NOTE — Telephone Encounter (Addendum)
Reports BP still elevated 130s/100-110s. Advised to double Carvedilol dose tonight and would follow up tomorrow.  DCCV scheduled for 10/12. Letter of instructions reviewed w/ pt. Pt has not missed any Eliquis doses in the last month. Pt will start Multaq this weekend, post DCCV. Pt understands office will call him to arrange 13mo f/u w/ [place].

## 2017-08-29 NOTE — Telephone Encounter (Signed)
Placed a referral for Neurosurgeron with Dr Katherine Roan. Patient called on status of referral.

## 2017-08-29 NOTE — Telephone Encounter (Signed)
New message   Patient wants to discuss scheduling a procedure at the hospital, he was not sure of the name of procedure. Also has concerns about  Coreg  As its effectiveness.    Pt c/o medication issue:  1. Name of Medication: carvedilol (COREG) 6.25 MG tablet  2. How are you currently taking this medication (dosage and times per day)? Take 1 tablet (6.25 mg total) by mouth 2 (two) times daily.  3. Are you having a reaction (difficulty breathing--STAT)? no  4. What is your medication issue? Not sure if  Coreg is working a strong as it should.

## 2017-08-29 NOTE — Telephone Encounter (Signed)
Error

## 2017-08-30 ENCOUNTER — Other Ambulatory Visit: Payer: Self-pay | Admitting: Cardiology

## 2017-08-30 NOTE — Telephone Encounter (Signed)
Pt tells me that his BP dropped to 99/74 after taking increased dosage. Was going to have pt stop Chlorthalidone and start Losartan 25 mg daily, per Dr. [name] -- but pt BP dropping, tells me Losartan doesn't work for his BP and Chlorthalidone seems to be the only medication that works, or did work, for him. He also reports taking whole tablet of Chlorthalidone and his medication list does not reflect this - it says he takes 1/2 tablet.  Will update medication list to whole tablet. Pt is going to take Chlorthalidone 25 mg and Coreg 6.25 mg BID and monitor BP over the next 24 hr. We will talk by Thursday to see how he is doing and if treatment plan needs adjusting. Patient verbalized understanding and agreeable to plan.

## 2017-09-01 ENCOUNTER — Telehealth: Payer: Self-pay | Admitting: Cardiology

## 2017-09-01 NOTE — Telephone Encounter (Signed)
She wanted to confirm date of DCCV procedure. Noted that telephone note states 11/12 but procedure is on for tomorrow. Informed that was a mistake and tomorrow is the correct date. Addended telephone note to reflect correct date.

## 2017-09-01 NOTE — Telephone Encounter (Signed)
New message    Kathlee Nations is calling asking for a call back about pt. She said she needs some clarification

## 2017-09-01 NOTE — Telephone Encounter (Signed)
Pt not home. Spoke to wife, DPR on file, who tells me pt is feeling some better.  He is "still tired and laying around a lot". She will have pt continue to monitor BPs and call me next week to update [place] on how he is doing. Advised to call the office if he begins to experience symptoms/issues.

## 2017-09-02 ENCOUNTER — Ambulatory Visit (HOSPITAL_COMMUNITY): Payer: Medicare Other | Admitting: Anesthesiology

## 2017-09-02 ENCOUNTER — Ambulatory Visit (HOSPITAL_COMMUNITY)
Admission: RE | Admit: 2017-09-02 | Discharge: 2017-09-02 | Disposition: A | Discharge status: Home / Self Care | Payer: Medicare Other | Source: Ambulatory Visit | Attending: Internal Medicine | Admitting: Internal Medicine

## 2017-09-02 ENCOUNTER — Encounter (HOSPITAL_COMMUNITY)
Admission: RE | Disposition: A | Discharge status: Home / Self Care | Payer: Self-pay | Source: Ambulatory Visit | Attending: Internal Medicine

## 2017-09-02 ENCOUNTER — Encounter (HOSPITAL_COMMUNITY): Payer: Self-pay | Admitting: *Deleted

## 2017-09-02 DIAGNOSIS — Z8673 Personal history of transient ischemic attack (TIA), and cerebral infarction without residual deficits: Secondary | ICD-10-CM | POA: Diagnosis not present

## 2017-09-02 DIAGNOSIS — Z885 Allergy status to narcotic agent status: Secondary | ICD-10-CM | POA: Diagnosis not present

## 2017-09-02 DIAGNOSIS — I4891 Unspecified atrial fibrillation: Secondary | ICD-10-CM | POA: Insufficient documentation

## 2017-09-02 DIAGNOSIS — Z7901 Long term (current) use of anticoagulants: Secondary | ICD-10-CM | POA: Diagnosis not present

## 2017-09-02 DIAGNOSIS — E785 Hyperlipidemia, unspecified: Secondary | ICD-10-CM | POA: Diagnosis not present

## 2017-09-02 DIAGNOSIS — Z6832 Body mass index (BMI) 32.0-32.9, adult: Secondary | ICD-10-CM | POA: Diagnosis not present

## 2017-09-02 DIAGNOSIS — E669 Obesity, unspecified: Secondary | ICD-10-CM | POA: Diagnosis not present

## 2017-09-02 DIAGNOSIS — Z79899 Other long term (current) drug therapy: Secondary | ICD-10-CM | POA: Diagnosis not present

## 2017-09-02 DIAGNOSIS — I10 Essential (primary) hypertension: Secondary | ICD-10-CM | POA: Diagnosis not present

## 2017-09-02 DIAGNOSIS — I4892 Unspecified atrial flutter: Secondary | ICD-10-CM | POA: Diagnosis not present

## 2017-09-02 DIAGNOSIS — I484 Atypical atrial flutter: Secondary | ICD-10-CM

## 2017-09-02 DIAGNOSIS — I481 Persistent atrial fibrillation: Secondary | ICD-10-CM | POA: Diagnosis not present

## 2017-09-02 DIAGNOSIS — Z888 Allergy status to other drugs, medicaments and biological substances status: Secondary | ICD-10-CM | POA: Diagnosis not present

## 2017-09-02 DIAGNOSIS — Z7982 Long term (current) use of aspirin: Secondary | ICD-10-CM | POA: Diagnosis not present

## 2017-09-02 HISTORY — PX: CARDIOVERSION: SHX1299

## 2017-09-02 LAB — POCT I-STAT, CHEM 8
BUN: 27 mg/dL — ABNORMAL HIGH (ref 6–20)
CALCIUM ION: 1.01 mmol/L — AB (ref 1.15–1.40)
CHLORIDE: 102 mmol/L (ref 101–111)
CREATININE: 1.6 mg/dL — AB (ref 0.61–1.24)
GLUCOSE: 92 mg/dL (ref 65–99)
HCT: 52 % (ref 39.0–52.0)
Hemoglobin: 17.7 g/dL — ABNORMAL HIGH (ref 13.0–17.0)
POTASSIUM: 5.9 mmol/L — AB (ref 3.5–5.1)
Sodium: 138 mmol/L (ref 135–145)
TCO2: 31 mmol/L (ref 22–32)

## 2017-09-02 LAB — BASIC METABOLIC PANEL
ANION GAP: 9 (ref 5–15)
BUN: 18 mg/dL (ref 6–20)
CO2: 27 mmol/L (ref 22–32)
Calcium: 9 mg/dL (ref 8.9–10.3)
Chloride: 102 mmol/L (ref 101–111)
Creatinine, Ser: 1.62 mg/dL — ABNORMAL HIGH (ref 0.61–1.24)
GFR calc Af Amer: 47 mL/min — ABNORMAL LOW (ref >60)
GFR calc non Af Amer: 41 mL/min — ABNORMAL LOW (ref >60)
GLUCOSE: 93 mg/dL (ref 65–99)
POTASSIUM: 3.3 mmol/L — AB (ref 3.5–5.1)
Sodium: 138 mmol/L (ref 135–145)

## 2017-09-02 LAB — POCT I-STAT 4, (NA,K, GLUC, HGB,HCT)
Glucose, Bld: 92 mg/dL (ref 65–99)
HEMATOCRIT: 51 % (ref 39.0–52.0)
HEMOGLOBIN: 17.3 g/dL — AB (ref 13.0–17.0)
Potassium: 3.3 mmol/L — ABNORMAL LOW (ref 3.5–5.1)
Sodium: 143 mmol/L (ref 135–145)

## 2017-09-02 SURGERY — CARDIOVERSION
Anesthesia: General

## 2017-09-02 MED ORDER — APIXABAN 5 MG PO TABS
5.0000 mg | ORAL_TABLET | Freq: Once | ORAL | Status: AC
  Administered 2017-09-02: 5 mg via ORAL
  Filled 2017-09-02: qty 1

## 2017-09-02 MED ORDER — PROPOFOL 10 MG/ML IV BOLUS
INTRAVENOUS | Status: DC | PRN
  Administered 2017-09-02: 60 mg via INTRAVENOUS

## 2017-09-02 MED ORDER — LIDOCAINE 2% (20 MG/ML) 5 ML SYRINGE
INTRAMUSCULAR | Status: DC | PRN
  Administered 2017-09-02: 40 mg via INTRAVENOUS

## 2017-09-02 MED ORDER — SODIUM CHLORIDE 0.9 % IV SOLN
INTRAVENOUS | Status: DC | PRN
  Administered 2017-09-02: 10:00:00 via INTRAVENOUS

## 2017-09-02 NOTE — Anesthesia Preprocedure Evaluation (Signed)
Anesthesia Evaluation  Patient identified by MRN, date of birth, ID band Patient awake    Reviewed: Allergy & Precautions, NPO status , Patient's Chart, lab work & pertinent test results, reviewed documented beta blocker date and time   Airway Mallampati: II  TM Distance: >3 FB Neck ROM: Full    Dental no notable dental hx. (+) Teeth Intact   Pulmonary    Pulmonary exam normal breath sounds clear to auscultation       Cardiovascular hypertension, Pt. on medications and Pt. on home beta blockers Normal cardiovascular exam+ dysrhythmias Atrial Fibrillation  Rhythm:Irregular Rate:Normal     Neuro/Psych  Neuromuscular disease CVA, No Residual Symptoms negative psych ROS   GI/Hepatic negative GI ROS, Neg liver ROS,   Endo/Other  Obesity Hyperlipidemia  Renal/GU negative Renal ROS  negative genitourinary   Musculoskeletal Chronic back pain Lumbar radiculopathy   Abdominal (+) + obese,   Peds  Hematology Eliquis- last dose this am   Anesthesia Other Findings   Reproductive/Obstetrics                             Anesthesia Physical Anesthesia Plan  ASA: III  Anesthesia Plan: General   Post-op Pain Management:    Induction: Intravenous  PONV Risk Score and Plan:   Airway Management Planned: Mask  Additional Equipment:   Intra-op Plan:   Post-operative Plan:   Informed Consent: I have reviewed the patients History and Physical, chart, labs and discussed the procedure including the risks, benefits and alternatives for the proposed anesthesia with the patient or authorized representative who has indicated his/her understanding and acceptance.   Dental advisory given  Plan Discussed with: CRNA, Surgeon and Anesthesiologist  Anesthesia Plan Comments:         Anesthesia Quick Evaluation

## 2017-09-02 NOTE — CV Procedure (Signed)
   CARDIOVERSION NOTE  Procedure: Electrical Cardioversion Indications:  Atrial Flutter  Procedure Details:  Consent: Risks of procedure as well as the alternatives and risks of each were explained to the (patient/caregiver).  Consent for procedure obtained.  Time Out: Verified patient identification, verified procedure, site/side was marked, verified correct patient position, special equipment/implants available, medications/allergies/relevent history reviewed, required imaging and test results available.  Performed  Patient placed on cardiac monitor, pulse oximetry, supplemental oxygen as necessary.  Sedation given: Propofol per anesthesia Pacer pads placed anterior and posterior chest.  Cardioverted 1 time(s).  Cardioverted at 150J biphasic.  Impression: Findings: Post procedure EKG shows: Sinus bradycardia Complications: None Patient did tolerate procedure well.  Plan: 1. Successful DCCV to sinus bradycardia with a single 150J biphasic shock. 2. Will d/c carvedilol given HR in 30's and 40's (he did not take the dose this am) - d/w Dr. [name] and he agrees.  Time Spent Directly with the Patient:  30 minutes   [name], MD, [place]  Attending Cardiologist  Direct Dial: [phone]  Fax: [phone]  Website:  [email or web address] 09/02/2017, 9:59 AM

## 2017-09-02 NOTE — Discharge Instructions (Signed)
Electrical Cardioversion, Care After °This sheet gives you information about how to care for yourself after your procedure. Your health care provider may also give you more specific instructions. If you have problems or questions, contact your health care provider. °What can I expect after the procedure? °After the procedure, it is common to have: °· Some redness on the skin where the shocks were given. ° °Follow these instructions at home: °· Do not drive for 24 hours if you were given a medicine to help you relax (sedative). °· Take over-the-counter and prescription medicines only as told by your health care provider. °· Ask your health care provider how to check your pulse. Check it often. °· Rest for 48 hours after the procedure or as told by your health care provider. °· Avoid or limit your caffeine use as told by your health care provider. °Contact a health care provider if: °· You feel like your heart is beating too quickly or your pulse is not regular. °· You have a serious muscle cramp that does not go away. °Get help right away if: °· You have discomfort in your chest. °· You are dizzy or you feel faint. °· You have trouble breathing or you are short of breath. °· Your speech is slurred. °· You have trouble moving an arm or leg on one side of your body. °· Your fingers or toes turn cold or blue. °This information is not intended to replace advice given to you by your health care provider. Make sure you discuss any questions you have with your health care provider. °Document Released: 08/29/2013 Document Revised: 06/11/2016 Document Reviewed: 05/14/2016 °Elsevier Interactive Patient Education © 2018 [employer] Inc[employer] °Monitored Anesthesia Care, Care After °These instructions provide you with information about caring for yourself after your procedure. Your health care provider may also give you more specific instructions. Your treatment has been planned according to current medical practices, but problems  sometimes occur. Call your health care provider if you have any problems or questions after your procedure. °What can I expect after the procedure? °After your procedure, it is common to: °· Feel sleepy for several hours. °· Feel clumsy and have poor balance for several hours. °· Feel forgetful about what happened after the procedure. °· Have poor judgment for several hours. °· Feel nauseous or vomit. °· Have a sore throat if you had a breathing tube during the procedure. ° °Follow these instructions at home: °For at least 24 hours after the procedure: ° °· Do not: °? Participate in activities in which you could fall or become injured. °? Drive. °? Use heavy machinery. °? Drink alcohol. °? Take sleeping pills or medicines that cause drowsiness. °? Make important decisions or sign legal documents. °? Take care of children on your own. °· Rest. °Eating and drinking °· Follow the diet that is recommended by your health care provider. °· If you vomit, drink water, juice, or soup when you can drink without vomiting. °· Make sure you have little or no nausea before eating solid foods. °General instructions °· Have a responsible adult stay with you until you are awake and alert. °· Take over-the-counter and prescription medicines only as told by your health care provider. °· If you smoke, do not smoke without supervision. °· Keep all follow-up visits as told by your health care provider. This is important. °Contact a health care provider if: °· You keep feeling nauseous or you keep vomiting. °· You feel light-headed. °· You develop a rash. °·   You have a fever. °Get help right away if: °· You have trouble breathing. °This information is not intended to replace advice given to you by your health care provider. Make sure you discuss any questions you have with your health care provider. °Document Released: 02/29/2016 Document Revised: 06/30/2016 Document Reviewed: 02/29/2016 °Elsevier Interactive Patient Education © 2018  Elsevier Inc. ° °

## 2017-09-02 NOTE — Anesthesia Postprocedure Evaluation (Signed)
Anesthesia Post Note  Patient: Patrick Roth  Procedure(s) Performed: CARDIOVERSION (N/A )     Patient location during evaluation: PACU Anesthesia Type: General Level of consciousness: awake and alert Pain management: pain level controlled Vital Signs Assessment: post-procedure vital signs reviewed and stable Respiratory status: spontaneous breathing, nonlabored ventilation and respiratory function stable Cardiovascular status: blood pressure returned to baseline and stable Postop Assessment: no apparent nausea or vomiting Anesthetic complications: no    Last Vitals:  Vitals:   09/02/17 1005 09/02/17 1015  BP: 110/76 111/82  Pulse: (!) 41 (!) 44  Resp: (!) 24 17  Temp:    SpO2: 100% 100%    Last Pain:  Vitals:   09/02/17 0955  TempSrc: Oral                 Jaydian Santana A.

## 2017-09-02 NOTE — H&P (Signed)
   INTERVAL PROCEDURE H&P  History and Physical Interval Note:  09/02/2017 9:09 AM  Patrick Roth has presented today for their planned procedure. The various methods of treatment have been discussed with the patient and family. After consideration of risks, benefits and other options for treatment, the patient has consented to the procedure.  The patients' outpatient history has been reviewed, patient examined, and no change in status from most recent office note within the past 30 days. I have reviewed the patients' chart and labs and will proceed as planned. Questions were answered to the patient's satisfaction.   Patrick Casino, MD, Patrick Roth  Attending Cardiologist  Direct Dial: [phone]  Fax: [phone]  Website:  [email or web address] 09/02/2017, 9:09 AM

## 2017-09-02 NOTE — Transfer of Care (Signed)
Immediate Anesthesia Transfer of Care Note  Patient: Patrick Roth  Procedure(s) Performed: CARDIOVERSION (N/A )  Patient Location: PACU and Endoscopy Unit  Anesthesia Type:General  Level of Consciousness: awake, alert , oriented and patient cooperative  Airway & Oxygen Therapy: Patient Spontanous Breathing and Patient connected to nasal cannula oxygen  Post-op Assessment: Report given to RN, Post -op Vital signs reviewed and stable and Patient moving all extremities  Post vital signs: Reviewed and stable  Last Vitals:  Vitals:   09/02/17 0951 09/02/17 0952  BP:  104/76  Pulse: (!) 44 (!) 42  Resp: 14 12  Temp:    SpO2: 100% 100%    Last Pain:  Vitals:   09/02/17 0901  TempSrc: Oral         Complications: No apparent anesthesia complications

## 2017-09-04 ENCOUNTER — Encounter (HOSPITAL_COMMUNITY): Payer: Self-pay | Admitting: Internal Medicine

## 2017-09-05 ENCOUNTER — Telehealth: Payer: Self-pay | Admitting: Cardiology

## 2017-09-05 NOTE — Telephone Encounter (Addendum)
Reports early morning HA, around 3 am, that woke him up. Reports HA was on the right side of his forehead.  He did not take his BP (he reports BP are high in the morning and come down after he takes his morning medications). Informed patient symptoms reported are not r/t DCCV.  Informed that I am not sure that it would be r/t anesthesia being that this was first day he experienced the HA.  Advised to take OTC for HA and call PCP if that does not help resolve the issue. Pt agreeable to plan.  He also reports BPs: Wednesday, 10/10:    10 am - 112/86, HR 115    -   95/65, HR 108 Thursday, 10/11:    5 pm   - 114/87, HR 107    9 pm   - 117/90, HR 108 Friday, 10/12:       am  - 113/82, HR 107       pm -  101/74, HR 118 Saturday, 10/13:        am -  129/79, HR 52       pm -  134/88, HR 51 Sunday, 10/14:       am -   136/94, HR 80       pm -   147/87, HR 52 Monday, 10/15:       am -   149/96, HR 54  (before morning meds)  Patient will continue to monitor and we will follow up in the next several weeks.  Patient also reports extremely elevated K+ level at hospital Friday.  He understands I will discuss monitoring with Dr. [name] when he returns to the office next week and I will then let pt know recommendation.  Pt current K+ level WNL.

## 2017-09-05 NOTE — Telephone Encounter (Signed)
Patrick Roth is calling because he had a procedure on Friday and was awaken this morning with a very bad headache. When he stands up the headache stops but when he lies down again it starts back up and its radiating from the top of his head down to his neck .Marland Kitchen States that it is not a normal headache . Please Call

## 2017-09-09 ENCOUNTER — Telehealth: Payer: Self-pay | Admitting: Physician Assistant

## 2017-09-09 NOTE — Telephone Encounter (Signed)
Schedule appt with me next week to discuss.

## 2017-09-09 NOTE — Telephone Encounter (Signed)
Pt would like a call back to discuss how to control his potassium levels, pt states that he is having issues when it drops to low or rises to high.

## 2017-09-13 NOTE — Telephone Encounter (Signed)
LM with wife [name] for pt to return call to schedule an appt.

## 2017-09-14 ENCOUNTER — Other Ambulatory Visit: Payer: Self-pay | Admitting: Physician Assistant

## 2017-09-14 ENCOUNTER — Encounter: Payer: Self-pay | Admitting: Physician Assistant

## 2017-09-14 ENCOUNTER — Ambulatory Visit (INDEPENDENT_AMBULATORY_CARE_PROVIDER_SITE_OTHER): Payer: Medicare Other | Admitting: Physician Assistant

## 2017-09-14 VITALS — BP 130/82 | HR 62 | Temp 98.4°F | Resp 14 | Ht 69.0 in | Wt 215.0 lb

## 2017-09-14 DIAGNOSIS — I10 Essential (primary) hypertension: Secondary | ICD-10-CM

## 2017-09-14 DIAGNOSIS — G629 Polyneuropathy, unspecified: Secondary | ICD-10-CM

## 2017-09-14 LAB — BASIC METABOLIC PANEL
BUN: 18 mg/dL (ref 6–23)
CALCIUM: 9.5 mg/dL (ref 8.4–10.5)
CHLORIDE: 101 meq/L (ref 96–112)
CO2: 31 meq/L (ref 19–32)
CREATININE: 1.33 mg/dL (ref 0.40–1.50)
GFR: 67.86 mL/min (ref >60.00)
Glucose, Bld: 109 mg/dL — ABNORMAL HIGH (ref 70–99)
Potassium: 3.4 mEq/L — ABNORMAL LOW (ref 3.5–5.1)
Sodium: 141 mEq/L (ref 135–145)

## 2017-09-14 NOTE — Progress Notes (Signed)
Patient presents to clinic today to discuss managing potassium levels. Patient currently on chlorthalidone 25 mg daily. Endorses good BP control with regimen. Is prescribed Klor-Con 20 meQ to take as directed. Has not been taking as directed. States he was trying to control with dietary potassium.  Past Medical History:  Diagnosis Date  . Colon polyps   . History of chicken pox   . Hyperlipidemia   . Hypertension   . Internal hemorrhoids   . Irregular heartbeat   . Sciatica   . Stroke [facility]) 2008    Current Outpatient Prescriptions on File Prior to Visit  Medication Sig Dispense Refill  . aspirin 81 MG tablet Take 81 mg by mouth daily.    . chlorthalidone (HYGROTON) 25 MG tablet TAKE 1/2 (ONE-HALF) TABLET BY MOUTH ONCE DAILY 90 tablet 0  . dronedarone (MULTAQ) 400 MG tablet Take 1 tablet (400 mg total) by mouth 2 (two) times daily with a meal. 60 tablet 4  . ELIQUIS 5 MG TABS tablet TAKE 1 TABLET BY MOUTH TWICE DAILY 60 tablet 0   No current facility-administered medications on file prior to visit.     Allergies  Allergen Reactions  . Crestor [Rosuvastatin Calcium] Shortness Of Breath    Muscle cramps  . Spironolactone Shortness Of Breath  . Terazosin Shortness Of Breath, Palpitations and Other (See Comments)    Nerve pain  . Atenolol Other (See Comments)    Drowsiness and "flu sxs"  . Bystolic [Nebivolol Hcl] Other (See Comments)    GI issues  . Hydralazine Other (See Comments)    Joint swelling  . Tizanidine Other (See Comments)    Syncope, Elevated BP/pulse  . Amlodipine Other (See Comments)  . Clonidine Other (See Comments)  . Hydrocodone-Acetaminophen Other (See Comments)  . Levofloxacin Other (See Comments)  . Losartan Other (See Comments)    Did not work.  Pt states he couldn't take b/c of SE, but can't remember what SE were.  . Maxzide [Hydrochlorothiazide W-Triamterene] Other (See Comments)    Does not tolerate potassium-sparing diuretics  . Olmesartan Other  (See Comments)  . Tussionex Pennkinetic Er [Hydrocod Polst-Cpm Polst Er] Other (See Comments)    Other reaction(s): Unknown    Family History  Problem Relation Age of Onset  . Hypertension Father 58       Deceased  . Heart disease Father   . Lung cancer Mother 67       Deceased  . Healthy Sister        x3  . Healthy Brother        x4  . Heart disease Brother        #5  . Other Daughter        Alpha Thalassemia  . Lupus Daughter        #2-deceased  . Diabetes Neg Hx   . Heart attack Neg Hx   . Hyperlipidemia Neg Hx   . Sudden death Neg Hx     Social History   Social History  . Marital status: Married    Spouse name: N/A  . Number of children: 1  . Years of education: 83   Occupational History  . Retired    Social History Main Topics  . Smoking status: Never Smoker  . Smokeless tobacco: Never Used  . Alcohol use No  . Drug use: No  . Sexual activity: Not Asked   Other Topics Concern  . None   Social History Narrative   Fun: Play  golf and build furniture.     Review of Systems - See HPI.  All other ROS are negative.  BP 130/82   Pulse 62   Temp 98.4 F (36.9 C) (Oral)   Resp 14   Ht _0  (1.753 m)   Wt 215 lb (97.5 kg)   SpO2 98%   BMI 31.75 kg/m   Physical Exam  Constitutional: He is oriented to person, place, and time and well-developed, well-nourished, and in no distress.  HENT:  Head: Normocephalic and atraumatic.  Eyes: Conjunctivae are normal.  Neck: Neck supple.  Cardiovascular: Normal rate, regular rhythm, normal heart sounds and intact distal pulses.   Pulmonary/Chest: Effort normal and breath sounds normal. No respiratory distress. He has no wheezes. He has no rales. He exhibits no tenderness.  Neurological: He is alert and oriented to person, place, and time.  Skin: Skin is warm and dry. No rash noted.  Psychiatric: Affect normal.  Vitals reviewed.  Recent Results (from the past 2160 hour(s))  Comprehensive metabolic panel      Status: Abnormal   Collection Time: 08/03/17 10:12 AM  Result Value Ref Range   Sodium 138 135 - 145 mEq/L   Potassium 3.8 3.5 - 5.1 mEq/L   Chloride 99 96 - 112 mEq/L   CO2 32 19 - 32 mEq/L   Glucose, Bld 90 70 - 99 mg/dL   BUN 14 6 - 23 mg/dL   Creatinine, Ser 1.27 0.40 - 1.50 mg/dL   Total Bilirubin 1.3 (H) 0.2 - 1.2 mg/dL   Alkaline Phosphatase 73 39 - 117 U/L   AST 21 0 - 37 U/L   ALT 18 0 - 53 U/L   Total Protein 6.9 6.0 - 8.3 g/dL   Albumin 4.1 3.5 - 5.2 g/dL   Calcium 9.4 8.4 - 10.5 mg/dL   GFR 71.59 >60.00 mL/min  CBC with Differential/Platelet     Status: Abnormal   Collection Time: 08/03/17 10:12 AM  Result Value Ref Range   WBC 4.3 4.0 - 10.5 K/uL   RBC 5.70 4.22 - 5.81 Mil/uL   Hemoglobin 16.4 13.0 - 17.0 g/dL   HCT 50.9 39.0 - 52.0 %   MCV 89.3 78.0 - 100.0 fl   MCHC 32.2 30.0 - 36.0 g/dL   RDW 14.5 11.5 - 15.5 %   Platelets 164.0 150.0 - 400.0 K/uL   Neutrophils Relative % 56.4 43.0 - 77.0 %   Lymphocytes Relative 28.4 12.0 - 46.0 %   Monocytes Relative 13.6 (H) 3.0 - 12.0 %   Eosinophils Relative 1.0 0.0 - 5.0 %   Basophils Relative 0.6 0.0 - 3.0 %   Neutro Abs 2.4 1.4 - 7.7 K/uL   Lymphs Abs 1.2 0.7 - 4.0 K/uL   Monocytes Absolute 0.6 0.1 - 1.0 K/uL   Eosinophils Absolute 0.0 0.0 - 0.7 K/uL   Basophils Absolute 0.0 0.0 - 0.1 K/uL  Lipid panel     Status: Abnormal   Collection Time: 08/03/17 10:12 AM  Result Value Ref Range   Cholesterol 235 (H) 0 - 200 mg/dL    Comment: ATP III Classification       Desirable:  < 200 mg/dL               Borderline High:  200 - 239 mg/dL          High:  > = 240 mg/dL   Triglycerides 90.0 0.0 - 149.0 mg/dL    Comment: Normal:  <150 mg/dLBorderline High:  150 - 199 mg/dL   HDL 61.90 >39.00 mg/dL   VLDL 18.0 0.0 - 40.0 mg/dL   LDL Cholesterol 155 (H) 0 - 99 mg/dL   Total CHOL/HDL Ratio 4     Comment:                Men          Women1/2 Average Risk     3.4          3.3Average Risk          5.0          4.42X Average Risk           9.6          7.13X Average Risk          15.0          11.0                       NonHDL 173.45     Comment: NOTE:  Non-HDL goal should be 30 mg/dL higher than patient's LDL goal (i.e. LDL goal of < 70 mg/dL, would have non-HDL goal of < 100 mg/dL)  I-STAT, chem 8     Status: Abnormal   Collection Time: 09/02/17  9:17 AM  Result Value Ref Range   Sodium 138 135 - 145 mmol/L   Potassium 5.9 (H) 3.5 - 5.1 mmol/L   Chloride 102 101 - 111 mmol/L   BUN 27 (H) 6 - 20 mg/dL   Creatinine, Ser 1.60 (H) 0.61 - 1.24 mg/dL   Glucose, Bld 92 65 - 99 mg/dL   Calcium, Ion 1.01 (L) 1.15 - 1.40 mmol/L   TCO2 31 22 - 32 mmol/L   Hemoglobin 17.7 (H) 13.0 - 17.0 g/dL   HCT 52.0 39.0 - 37.9 %  Basic metabolic panel     Status: Abnormal   Collection Time: 09/02/17  9:29 AM  Result Value Ref Range   Sodium 138 135 - 145 mmol/L   Potassium 3.3 (L) 3.5 - 5.1 mmol/L   Chloride 102 101 - 111 mmol/L   CO2 27 22 - 32 mmol/L   Glucose, Bld 93 65 - 99 mg/dL   BUN 18 6 - 20 mg/dL   Creatinine, Ser 1.62 (H) 0.61 - 1.24 mg/dL   Calcium 9.0 8.9 - 10.3 mg/dL   GFR calc non Af Amer 41 (L) >60 mL/min   GFR calc Af Amer 47 (L) >60 mL/min    Comment: (NOTE) The eGFR has been calculated using the CKD EPI equation. This calculation has not been validated in all clinical situations. eGFR's persistently <60 mL/min signify possible Chronic Kidney Disease.    Anion gap 9 5 - 15  I-STAT 4, (NA,K, GLUC, HGB,HCT)     Status: Abnormal   Collection Time: 09/02/17  9:31 AM  Result Value Ref Range   Sodium 143 135 - 145 mmol/L   Potassium 3.3 (L) 3.5 - 5.1 mmol/L   Glucose, Bld 92 65 - 99 mg/dL   HCT 51.0 39.0 - 52.0 %   Hemoglobin 17.3 (H) 13.0 - 17.0 g/dL    Assessment/Plan: Essential hypertension BP stable. Discussed need for potassium supplementation while on thiazide diuretic. Will check BMP to assess levels. Will restart potassium, dosed by potassium levels on labs.    Leeanne Rio, PA-C

## 2017-09-14 NOTE — Telephone Encounter (Signed)
Pt seen by PCP today.  PCP addressed BP and K+.

## 2017-09-14 NOTE — Progress Notes (Signed)
Pre visit review using our clinic review tool, if applicable. No additional management support is needed unless otherwise documented below in the visit note. 

## 2017-09-14 NOTE — Assessment & Plan Note (Signed)
BP stable. Discussed need for potassium supplementation while on thiazide diuretic. Will check BMP to assess levels. Will restart potassium, dosed by potassium levels on labs.

## 2017-09-14 NOTE — Patient Instructions (Signed)
Please stay hydrated. Keep up with the dietary intake of potassium.  Be consistent with medications. As discussed, the chlorthalidone for your BP is doing great for BP but is also pulling out potassium. We need to restart potassium. We will check the potassium today and dose the supplement based on results.

## 2017-09-15 ENCOUNTER — Other Ambulatory Visit: Payer: Self-pay | Admitting: Physician Assistant

## 2017-09-15 DIAGNOSIS — E876 Hypokalemia: Secondary | ICD-10-CM

## 2017-09-15 MED ORDER — POTASSIUM CHLORIDE CRYS ER 10 MEQ PO TBCR: 10.0000 meq | EXTENDED_RELEASE_TABLET | Freq: Every day | ORAL | 3 refills | Status: DC

## 2017-09-26 DIAGNOSIS — M4726 Other spondylosis with radiculopathy, lumbar region: Secondary | ICD-10-CM | POA: Diagnosis not present

## 2017-09-26 DIAGNOSIS — M48061 Spinal stenosis, lumbar region without neurogenic claudication: Secondary | ICD-10-CM | POA: Diagnosis not present

## 2017-09-26 DIAGNOSIS — D1809 Hemangioma of other sites: Secondary | ICD-10-CM | POA: Diagnosis not present

## 2017-09-26 DIAGNOSIS — M5116 Intervertebral disc disorders with radiculopathy, lumbar region: Secondary | ICD-10-CM | POA: Diagnosis not present

## 2017-09-27 ENCOUNTER — Telehealth: Payer: Self-pay | Admitting: Physician Assistant

## 2017-09-27 MED ORDER — CHLORTHALIDONE 25 MG PO TABS: 25.0000 mg | ORAL_TABLET | Freq: Every day | ORAL | 0 refills | Status: DC

## 2017-09-27 NOTE — Telephone Encounter (Signed)
Nothing he should concern himself with -- likely said he needed a creatinine level first which we have checked.

## 2017-09-27 NOTE — Telephone Encounter (Signed)
Pt needs a new Rx on BP meds and asking that it be for a 90 day supply. [facility] on [address].  Pt states when picking up Rx for potassium a sheet was given to him stating that pt may want to get a CT scan done before taking this, pt asking why and states that he had CT done yesterday. please advise.

## 2017-09-27 NOTE — Telephone Encounter (Signed)
It was a general "information" paper given to patient by the pharmacy    Medication has been sent in to pharmacy

## 2017-09-27 NOTE — Telephone Encounter (Signed)
Ok to send in Rx for 1 tablet daily. Can you speak with patient for more information concerning the paper he was given recommending CT? Who gave him this paper, etc?

## 2017-09-27 NOTE — Telephone Encounter (Signed)
Patient calling to say that the bottle of medication states 1/2 pill daily, but his paperwork from the last visit says 1 a day.  He has been taking 1 tablet daily, so the 90 day Rx would need to reflect that if this is what he is to continue.

## 2017-09-28 NOTE — Telephone Encounter (Signed)
Patient has been informed, and stated verbal understanding.

## 2017-09-30 ENCOUNTER — Other Ambulatory Visit (INDEPENDENT_AMBULATORY_CARE_PROVIDER_SITE_OTHER): Payer: Medicare Other

## 2017-09-30 DIAGNOSIS — E876 Hypokalemia: Secondary | ICD-10-CM

## 2017-09-30 LAB — BASIC METABOLIC PANEL
BUN: 15 mg/dL (ref 6–23)
CHLORIDE: 99 meq/L (ref 96–112)
CO2: 32 meq/L (ref 19–32)
CREATININE: 1.35 mg/dL (ref 0.40–1.50)
Calcium: 9.4 mg/dL (ref 8.4–10.5)
GFR: 66.69 mL/min (ref >60.00)
GLUCOSE: 84 mg/dL (ref 70–99)
Potassium: 3.4 mEq/L — ABNORMAL LOW (ref 3.5–5.1)
Sodium: 139 mEq/L (ref 135–145)

## 2017-10-04 ENCOUNTER — Other Ambulatory Visit: Payer: Self-pay | Admitting: Physician Assistant

## 2017-10-04 DIAGNOSIS — E876 Hypokalemia: Secondary | ICD-10-CM

## 2017-10-04 MED ORDER — POTASSIUM CHLORIDE CRYS ER 10 MEQ PO TBCR: 10.0000 meq | EXTENDED_RELEASE_TABLET | Freq: Two times a day (BID) | ORAL | 3 refills | Status: DC

## 2017-10-18 ENCOUNTER — Other Ambulatory Visit: Payer: Medicare Other

## 2017-10-19 DIAGNOSIS — M48061 Spinal stenosis, lumbar region without neurogenic claudication: Secondary | ICD-10-CM | POA: Diagnosis not present

## 2017-10-19 DIAGNOSIS — M5136 Other intervertebral disc degeneration, lumbar region: Secondary | ICD-10-CM | POA: Diagnosis not present

## 2017-10-19 DIAGNOSIS — I499 Cardiac arrhythmia, unspecified: Secondary | ICD-10-CM | POA: Diagnosis not present

## 2017-10-19 DIAGNOSIS — M4726 Other spondylosis with radiculopathy, lumbar region: Secondary | ICD-10-CM | POA: Diagnosis not present

## 2017-11-16 DIAGNOSIS — E785 Hyperlipidemia, unspecified: Secondary | ICD-10-CM | POA: Insufficient documentation

## 2017-11-16 DIAGNOSIS — K635 Polyp of colon: Secondary | ICD-10-CM | POA: Insufficient documentation

## 2017-11-16 DIAGNOSIS — E782 Mixed hyperlipidemia: Secondary | ICD-10-CM | POA: Insufficient documentation

## 2017-11-16 DIAGNOSIS — K648 Other hemorrhoids: Secondary | ICD-10-CM | POA: Insufficient documentation

## 2017-11-16 DIAGNOSIS — I10 Essential (primary) hypertension: Secondary | ICD-10-CM | POA: Insufficient documentation

## 2017-11-16 DIAGNOSIS — Z8619 Personal history of other infectious and parasitic diseases: Secondary | ICD-10-CM | POA: Insufficient documentation

## 2017-11-16 DIAGNOSIS — I499 Cardiac arrhythmia, unspecified: Secondary | ICD-10-CM | POA: Insufficient documentation

## 2017-11-16 DIAGNOSIS — M5430 Sciatica, unspecified side: Secondary | ICD-10-CM | POA: Insufficient documentation

## 2017-11-17 ENCOUNTER — Other Ambulatory Visit (INDEPENDENT_AMBULATORY_CARE_PROVIDER_SITE_OTHER): Payer: Medicare Other

## 2017-11-17 DIAGNOSIS — I10 Essential (primary) hypertension: Secondary | ICD-10-CM | POA: Diagnosis not present

## 2017-11-17 LAB — BASIC METABOLIC PANEL
BUN: 16 mg/dL (ref 6–23)
CHLORIDE: 101 meq/L (ref 96–112)
CO2: 30 meq/L (ref 19–32)
Calcium: 8.9 mg/dL (ref 8.4–10.5)
Creatinine, Ser: 1.51 mg/dL — ABNORMAL HIGH (ref 0.40–1.50)
GFR: 58.58 mL/min — ABNORMAL LOW (ref >60.00)
Glucose, Bld: 90 mg/dL (ref 70–99)
POTASSIUM: 3 meq/L — AB (ref 3.5–5.1)
Sodium: 139 mEq/L (ref 135–145)

## 2017-11-18 ENCOUNTER — Telehealth: Payer: Self-pay | Admitting: Emergency Medicine

## 2017-11-18 DIAGNOSIS — E876 Hypokalemia: Secondary | ICD-10-CM

## 2017-11-18 NOTE — Telephone Encounter (Signed)
Spoke with Dr. Gerarda Fraction at [facility] with [facility]. At the home visit on last Friday evening visit. Patient blood pressure was elevated. Dr started Doxazin 1 mg to help with BP. Dr also wanted a Magnesium check due to patient being on Multaq.

## 2017-11-18 NOTE — Telephone Encounter (Signed)
Added Doxazin to med list. Please advise

## 2017-11-21 ENCOUNTER — Telehealth: Payer: Self-pay | Admitting: Physician Assistant

## 2017-11-21 ENCOUNTER — Other Ambulatory Visit (INDEPENDENT_AMBULATORY_CARE_PROVIDER_SITE_OTHER): Payer: Medicare Other

## 2017-11-21 DIAGNOSIS — E876 Hypokalemia: Secondary | ICD-10-CM | POA: Diagnosis not present

## 2017-11-21 LAB — MAGNESIUM: MAGNESIUM: 1.9 mg/dL (ref 1.5–2.5)

## 2017-11-21 MED ORDER — POTASSIUM CHLORIDE CRYS ER 20 MEQ PO TBCR: 20.0000 meq | EXTENDED_RELEASE_TABLET | Freq: Two times a day (BID) | ORAL | 3 refills | Status: DC

## 2017-11-21 NOTE — Telephone Encounter (Signed)
Copied from [place] [id number]. Topic: Quick Communication - Rx Refill/Question >> Nov 21, 2017 12:44 PM Oliver Pila B wrote: Pt called to get clarity on his Rx of potassium chloride SA (K-DUR,KLOR-CON) 20 MEQ tablet [[id number]], pt states a message was left for him to take meds for 2 pills 2x a day but directions say 1 pill 2x daily, contact pt to advise on correct method

## 2017-11-21 NOTE — Telephone Encounter (Signed)
Change potassium to Klor con 20 mEq twice daily -- 40 mEq total per day. Ok to send in new Rx. I cannot see the magnesium level that was added on. Can you check on this? Have patient see me late this week or early next week for follow-up and reassessment.

## 2017-11-21 NOTE — Addendum Note (Signed)
Addended by: Leonidas Romberg on: 11/21/2017 12:18 PM   Modules accepted: Orders

## 2017-11-21 NOTE — Telephone Encounter (Signed)
Advised patient of increasing potassium supplmenetation. He is agreeable. Advised to take 2 tabs bid until finished with bottle and sending over new rx for the 26meq bid. Will call back to schedule lab visit to recheck levels.

## 2017-11-23 NOTE — Telephone Encounter (Signed)
Called patient but was not at home. Spouse advised to call back when he returns home. Calling to clarify Potassium medication. Patient is currently taking Potassium 10 MEQ. To get rid of current rx he can take 2 tabs twice a day. Sent over new rx for Potassium 20 meq to take bid.

## 2017-11-24 ENCOUNTER — Telehealth: Payer: Self-pay | Admitting: Physician Assistant

## 2017-11-24 NOTE — Telephone Encounter (Signed)
Pt returned call and the information was given to him regarding the new dosage of Potassium. He is to take 20 meq bid. Pt voiced understanding.

## 2017-11-30 ENCOUNTER — Telehealth: Payer: Self-pay | Admitting: *Deleted

## 2017-11-30 NOTE — Telephone Encounter (Signed)
Received request for Medical Records from [facility] of [place]; forwarded to [place] for email/scan/SLS 01/09

## 2017-12-01 ENCOUNTER — Encounter: Payer: Self-pay | Admitting: Cardiology

## 2017-12-01 ENCOUNTER — Ambulatory Visit: Payer: Medicare Other | Admitting: Cardiology

## 2017-12-01 VITALS — BP 142/94 | HR 80 | Ht 69.0 in | Wt 213.0 lb

## 2017-12-01 DIAGNOSIS — I10 Essential (primary) hypertension: Secondary | ICD-10-CM | POA: Diagnosis not present

## 2017-12-01 DIAGNOSIS — I480 Paroxysmal atrial fibrillation: Secondary | ICD-10-CM

## 2017-12-01 NOTE — Progress Notes (Signed)
Electrophysiology Office Note   Date:  12/01/2017   ID:  Patrick Roth, DOB 07-16-1946, MRN [id number]  PCP:  Brunetta Jeans, PA-C  Cardiologist:  [name] Primary Electrophysiologist:  Jacilyn Sanpedro Meredith Leeds, MD    Chief Complaint  Patient presents with  . Follow-up    Persistent Afib     History of Present Illness: Patrick Roth is a 72 y.o. male who presents today for electrophysiology evaluation.   Hx of HTN and multiple drug intolerances, HL, prior CVA. He was admitted in 7/17 with syncope felt to be 2/2 orthostatic hypotension. He was found to be in new onset AF with RVR. He has been asymptomatic. He was started on Metoprolol Tartrate 25 bid and Xarelto for anticoagulation. He says that at times, he gets weak and short of breath. He says that it feels like his legs are full of bricks. Prior to his diagnosis with atrial fibrillation, he went to his primary physician to see if this could be a heart abnormality, but his EKGs were normal sinus.   Today, denies symptoms of palpitations, chest pain, shortness of breath, orthopnea, PND, lower extremity edema, claudication, dizziness, presyncope, syncope, bleeding, or neurologic sequela. The patient is tolerating medications without difficulties.  He has been feeling well since his cardioversion.  He is not having shortness of breath any longer.    Past Medical History:  Diagnosis Date  . Colon polyps   . History of chicken pox   . Hyperlipidemia   . Hypertension   . Internal hemorrhoids   . Irregular heartbeat   . Sciatica   . Stroke [facility]) 2008   Past Surgical History:  Procedure Laterality Date  . CARDIOVERSION N/A 09/02/2017   Procedure: CARDIOVERSION;  Surgeon: [name], MD;  Location[facility] ENDOSCOPY;  Service: Cardiovascular;  Laterality: N/A;  . COLONOSCOPY  2011  . great toe surgery    . PROSTATE BIOPSY       Current Outpatient Medications  Medication Sig Dispense Refill  . aspirin 81 MG tablet Take 81  mg by mouth daily.    . chlorthalidone (HYGROTON) 25 MG tablet Take 1 tablet (25 mg total) daily by mouth. 90 tablet 0  . doxazosin (CARDURA) 1 MG tablet Take 1-2 tablets by mouth daily for blood pressure 30 tablet 0  . dronedarone (MULTAQ) 400 MG tablet Take 1 tablet (400 mg total) by mouth 2 (two) times daily with a meal. 60 tablet 4  . ELIQUIS 5 MG TABS tablet TAKE 1 TABLET BY MOUTH TWICE DAILY 60 tablet 0  . gabapentin (NEURONTIN) 100 MG capsule TAKE 1 CAPSULE BY MOUTH IN THE MORNING, 1 CAPSULE IN THE AFTERNOON AND 3 CAPSULES IN THE EVENING 150 capsule 3  . potassium chloride SA (K-DUR,KLOR-CON) 20 MEQ tablet Take 1 tablet (20 mEq total) by mouth 2 (two) times daily. 60 tablet 3   No current facility-administered medications for this visit.     Allergies:   Crestor [rosuvastatin calcium]; Spironolactone; Terazosin; Atenolol; Bystolic [nebivolol hcl]; Hydralazine; Tizanidine; Amlodipine; Clonidine; Hydrocodone-acetaminophen; Levofloxacin; Losartan; Maxzide [hydrochlorothiazide w-triamterene]; Olmesartan; and Tussionex pennkinetic er [hydrocod polst-cpm polst er]   Social History:  The patient  reports that  has never smoked. he has never used smokeless tobacco. He reports that he does not drink alcohol or use drugs.   Family History:  The patient's family history includes Healthy in his brother and sister; Heart disease in his brother and father; Hypertension (age of onset: 86) in his father; Lung cancer (  age of onset: 4) in his mother; Lupus in his daughter; Other in his daughter.   ROS:  Please see the history of present illness.   Otherwise, review of systems is positive for leg pain.   All other systems are reviewed and negative.   PHYSICAL EXAM: VS:  Ht 5\' 9"  (1.753 m)   Wt 213 lb (96.6 kg)   BMI 31.45 kg/m  , BMI Body mass index is 31.45 kg/m. GEN: Well nourished, well developed, in no acute distress  HEENT: normal  Neck: no JVD, carotid bruits, or masses Cardiac: RRR; no  murmurs, rubs, or gallops,no edema  Respiratory:  clear to auscultation bilaterally, normal work of breathing GI: soft, nontender, nondistended, + BS MS: no deformity or atrophy  Skin: warm and dry Neuro:  Strength and sensation are intact Psych: euthymic mood, full affect  EKG:  EKG is ordered today. Personal review of the ekg ordered shows SR, RBBB, LAFB, APCs   Recent Labs: 04/23/2017: TSH 0.998 08/03/2017: ALT 18; Platelets 164.0 09/02/2017: Hemoglobin 17.3 11/17/2017: BUN 16; Creatinine, Ser 1.51; Potassium 3.0; Sodium 139 11/21/2017: Magnesium 1.9    Lipid Panel     Component Value Date/Time   CHOL 235 (H) 08/03/2017 1012   TRIG 90.0 08/03/2017 1012   HDL 61.90 08/03/2017 1012   CHOLHDL 4 08/03/2017 1012   VLDL 18.0 08/03/2017 1012   LDLCALC 155 (H) 08/03/2017 1012     Wt Readings from Last 3 Encounters:  12/01/17 213 lb (96.6 kg)  09/14/17 215 lb (97.5 kg)  09/02/17 214 lb (97.1 kg)      Other studies Reviewed: Additional studies/ records that were reviewed today include: TTE 10/01/16  Review of the above records today demonstrates:  - Left ventricle: The cavity size was normal. Systolic function was   normal. The estimated ejection fraction was in the range of 55%   to 60%. Wall motion was normal; there were no regional wall   motion abnormalities. Doppler parameters are consistent with a   reversible restrictive pattern, indicative of decreased left   ventricular diastolic compliance and/or increased left atrial   pressure (grade 3 diastolic dysfunction). Doppler parameters are   consistent with high ventricular filling pressure. - Aortic valve: Transvalvular velocity was within the normal range.   There was no stenosis. - Mitral valve: Transvalvular velocity was within the normal range.   There was no evidence for stenosis. There was mild regurgitation. - Right ventricle: The cavity size was mildly dilated. Wall   thickness was normal. Systolic function  was normal. - Atrial septum: No defect or patent foramen ovale was identified   by color flow Doppler. - Tricuspid valve: There was mild regurgitation. - Pulmonary arteries: Systolic pressure was moderately to severely   increased. PA peak pressure: 57 mm Hg (S). - Pericardium, extracardiac: A trivial pericardial effusion was   identified.  Holter 05/12/17 - personally reviewed Minimum HR: 37 BPM at 12:18:54 PM Maximum HR: 124 BPM at 7:11:26 PM Average HR: 50 BPM 1% ventricular beats 4% supraventricular beats Ventricular runs of 6 beats Supraventricular runs of 13 beats  ASSESSMENT AND PLAN:  1.  Paroxysmal atrial fibrillation/atrial flutter: Sinus rhythm today with APCs.  He is feeling well without major complaints.  He has done well since he had his cardioversion.  He is tolerating the Multitak without issue.  We Jackelyn Illingworth make no further changes.  This patients CHA2DS2-VASc Score and unadjusted Ischemic Stroke Rate (% per year) is equal to 4.8 % stroke  rate/year from a score of 4  Above score calculated as 1 point each if present [CHF, HTN, DM, Vascular=MI/PAD/Aortic Plaque, Age if 65-74, or Male] Above score calculated as 2 points each if present [Age > 75, or Stroke/TIA/TE]  2. Hypertension: Blood pressure is mildly elevated today, though he brings in blood pressures from home which are well controlled.  No changes at this time.  Current medicines are reviewed at length with the patient today.   The patient does not have concerns regarding his medicines.  The following changes were made today: None  Labs/ tests ordered today include:  Orders Placed This Encounter  Procedures  . EKG 12-Lead     Disposition:   FU with Vonne Mcdanel 12 months  Signed, Jacobi Nile Meredith Leeds, MD  12/01/2017 11:18 AM     [facility] [address] [place] [address] [phone] (office) [phone] (fax)

## 2017-12-01 NOTE — Patient Instructions (Signed)
Medication Instructions:  Your physician recommends that you continue on your current medications as directed. Please refer to the Current Medication list given to you today.  * If you need a refill on your cardiac medications before your next appointment, please call your pharmacy. *  Labwork: None ordered  Testing/Procedures: None ordered  Follow-Up: Your physician wants you to follow-up in: 6 months with Chanetta Marshall, NP/[facility] [place].  You will receive a reminder letter in the mail two months in advance. If you don't receive a letter, please call our office to schedule the follow-up appointment.  Your physician wants you to follow-up in: 1 year with Dr. [name].  You will receive a reminder letter in the mail two months in advance. If you don't receive a letter, please call our office to schedule the follow-up appointment.  Thank you for choosing [facility]!!   Trinidad Curet, RN [phone]

## 2017-12-05 ENCOUNTER — Telehealth: Payer: Self-pay | Admitting: Cardiology

## 2017-12-05 NOTE — Telephone Encounter (Signed)
Nee message     Patient wants to be taken off of dronedarone (MULTAQ) 400 MG tablet because of the cost. Patient wants to also discuss assistance for Xarelto.  Pt c/o medication issue:  1. Name of Medication: dronedarone (MULTAQ) 400 MG tablet and Xarelto  2. How are you currently taking this medication (dosage and times per day)? As prescribed  3. Are you having a reaction (difficulty breathing--STAT)? No  4. What is your medication issue?medication too costly

## 2017-12-05 NOTE — Telephone Encounter (Signed)
Spoke with patient. He was receiving Eliquis through [employer] (pt assistance) and would like to apply for this again.  Without it the cost is too high to afford. Also cannot afford Multaq.  Since its the beginning of the year he has to pay a prescription deductible that is over $300 before he can get the medication for $35/mo.  He paid for 6 pills a couple days ago.  Will run out of Multaq after tomorrow.  He is asking to change to a less expensive medicine His prescription information states he should have periodic lab work while taking Multaq and would like to clarify this as well.

## 2017-12-06 NOTE — Telephone Encounter (Signed)
**Note De-Identified  Obfuscation** The pt came to the office today to pick up his Eliquis samples. While here I gave him a [employer] Pt assistance application.  I have filled out the provider part of the application and placed it in the yellow folder in the PA dept.

## 2017-12-06 NOTE — Telephone Encounter (Signed)
Informed pt I would leave samples at front desk, for him to pick up today, to give him coverage until situation can be discussed/reviewed by Dr. [name]. 18 days of samples left at front desk. Patient verbalized understanding and agreeable to plan.

## 2017-12-09 ENCOUNTER — Telehealth: Payer: Self-pay | Admitting: Physician Assistant

## 2017-12-09 DIAGNOSIS — N189 Chronic kidney disease, unspecified: Secondary | ICD-10-CM

## 2017-12-09 NOTE — Telephone Encounter (Signed)
I want the notes to review. I am assuming he had recent labs drawn with this provider or specialist because his GFR with me averages in the 60s. I am happy to place referral for him but I want these notes.

## 2017-12-09 NOTE — Telephone Encounter (Signed)
FYI

## 2017-12-09 NOTE — Telephone Encounter (Signed)
Copied from [place] [id number]. Topic: Quick Communication - See Telephone Encounter >> Dec 09, 2017 10:50 AM Oneta Rack wrote: CRM for notification. See Telephone encounter for:   12/09/17.  Caller name: Karena Addison  Relation to pt: from [facility]  Call back number: [phone]    Reason for call:   Dr Gerarda Fraction requesting referral to Nephrologist due to GFR 47 , hypertension , kidney disease. Faxing office notes to [phone]  >> Dec 09, 2017 10:57 AM Oneta Rack wrote: CRM for notification. See Telephone encounter for:   12/09/17.  Caller name: Karena Addison  Relation to pt: from [facility]  Call back number: [phone]    Reason for call:   Dr Gerarda Fraction requesting referral to Nephrologist due to GFR 47 , hypertension , kidney disease. Faxing office notes to [phone]

## 2017-12-12 ENCOUNTER — Telehealth: Payer: Self-pay | Admitting: Physician Assistant

## 2017-12-12 ENCOUNTER — Other Ambulatory Visit: Payer: Self-pay | Admitting: Physician Assistant

## 2017-12-12 DIAGNOSIS — E876 Hypokalemia: Secondary | ICD-10-CM

## 2017-12-12 DIAGNOSIS — N182 Chronic kidney disease, stage 2 (mild): Secondary | ICD-10-CM

## 2017-12-12 NOTE — Telephone Encounter (Signed)
Pt scheduled for lab draws for 1/22 @ 9:30am.

## 2017-12-12 NOTE — Telephone Encounter (Signed)
Referral has been placed. Future labs ordered as well. Needs to be scheduled for lab appointment.

## 2017-12-12 NOTE — Telephone Encounter (Signed)
Copied from Bruce [id number]. Topic: Quick Communication - See Telephone Encounter >> Dec 12, 2017  8:37 AM Boyd Kerbs wrote: CRM for notification. See Telephone encounter for:   Patient called bcbs wants him to see Kidney specialist,  he would like a referral to Dr. Power at [facility].   He said he was to have a blood test for potassium taken again. Will need order and appt. For labs  12/12/17.

## 2017-12-13 ENCOUNTER — Telehealth: Payer: Self-pay

## 2017-12-13 ENCOUNTER — Other Ambulatory Visit (INDEPENDENT_AMBULATORY_CARE_PROVIDER_SITE_OTHER): Payer: Medicare Other

## 2017-12-13 DIAGNOSIS — E876 Hypokalemia: Secondary | ICD-10-CM

## 2017-12-13 LAB — BASIC METABOLIC PANEL
BUN: 15 mg/dL (ref 6–23)
CHLORIDE: 99 meq/L (ref 96–112)
CO2: 32 mEq/L (ref 19–32)
Calcium: 9.3 mg/dL (ref 8.4–10.5)
Creatinine, Ser: 1.4 mg/dL (ref 0.40–1.50)
GFR: 63.91 mL/min (ref >60.00)
Glucose, Bld: 101 mg/dL — ABNORMAL HIGH (ref 70–99)
POTASSIUM: 3.3 meq/L — AB (ref 3.5–5.1)
Sodium: 138 mEq/L (ref 135–145)

## 2017-12-13 NOTE — Telephone Encounter (Addendum)
The pt returned his [employer]MS Pt assistance application today without his proof of income from the year 2018 or his out of pocket expense report for 2019 from his pharmacy.  He states that he will obtain the needed documents and bring them to the office so I can fax his application to BMS.  I have placed the providers part of the application in the filing cabinet in the PA dept.

## 2017-12-15 ENCOUNTER — Other Ambulatory Visit: Payer: Self-pay | Admitting: Emergency Medicine

## 2017-12-15 DIAGNOSIS — E876 Hypokalemia: Secondary | ICD-10-CM

## 2017-12-15 MED ORDER — POTASSIUM CHLORIDE ER 10 MEQ PO TBCR: EXTENDED_RELEASE_TABLET | ORAL | 3 refills | Status: DC

## 2017-12-19 ENCOUNTER — Telehealth: Payer: Self-pay

## 2017-12-19 ENCOUNTER — Other Ambulatory Visit: Payer: Self-pay

## 2017-12-19 MED ORDER — APIXABAN 5 MG PO TABS: 5.0000 mg | ORAL_TABLET | Freq: Two times a day (BID) | ORAL | 3 refills | Status: DC

## 2017-12-19 NOTE — Telephone Encounter (Signed)
The pt returned his proof of income and his out of pocket expense report from his pharmacy. He included a note stating that he cannot afford either Multaq or Eliquis and wants to take something less expensive.  I called him and offered to mail him a pt assistance application for his Multaq but he stated that he really just wants to take something less expensive because he does not want to have to apply for pt assistance yearly.  I have advised him that I will send this message to Dr [name] and ask if there is less expensive medications that he can take in place of Eliquis and Multaq.  Also I have mailed a Multaq pt assistance application to the pts address.

## 2017-12-19 NOTE — Telephone Encounter (Signed)
**Note De-Identified  Obfuscation** The pt brought in his proof of income and his out of pocket expense report.  I have completed the providers part of the BMS pt assistance application for Eliquis, printed an Eliquis RX and placed both in Dr [employer] mail bin awaiting his signature.

## 2017-12-20 ENCOUNTER — Telehealth: Payer: Self-pay

## 2017-12-20 DIAGNOSIS — Z79899 Other long term (current) drug therapy: Secondary | ICD-10-CM

## 2017-12-20 DIAGNOSIS — I4819 Other persistent atrial fibrillation: Secondary | ICD-10-CM

## 2017-12-20 NOTE — Telephone Encounter (Signed)
Dr [name] has signed the provider part of the pt assistance application and the Eliquis RX. I have faxed all to BMS.

## 2017-12-22 ENCOUNTER — Telehealth: Payer: Self-pay | Admitting: Cardiology

## 2017-12-22 NOTE — Telephone Encounter (Signed)
Informed patient samples left at front desk.

## 2017-12-22 NOTE — Telephone Encounter (Signed)
New message   Patient calling the office for samples of medication:   1.  What medication and dosage are you requesting samples for? Multaq 400 mg tablets  2.  Are you currently out of this medication? yes

## 2017-12-22 NOTE — Telephone Encounter (Signed)
Follow up  Pt calling to check on his request to get a least expensive alternative for Eliquis and Multaq. Please call

## 2017-12-22 NOTE — Telephone Encounter (Signed)
Informed pt that we were still working on this and I would let him know soon.  Will arrange for samples of both medications while we see what options we have. Pt is appreciative

## 2017-12-22 NOTE — Telephone Encounter (Signed)
Samples signed out and given to Dr [name] nurse earlier today for the patient.

## 2017-12-24 ENCOUNTER — Other Ambulatory Visit: Payer: Self-pay | Admitting: Physician Assistant

## 2017-12-26 NOTE — Telephone Encounter (Signed)
Patient should be taking his Potassium 1 tab in am and 2 tabs in pm. 30 mg daily until follow up appt to recheck level.

## 2017-12-27 ENCOUNTER — Other Ambulatory Visit: Payer: Self-pay | Admitting: Physician Assistant

## 2017-12-29 NOTE — Telephone Encounter (Signed)
Received fax from Center, they have received patients application for assistance with ELIQUIS, they will respond within 2 business days with a decision.

## 2017-12-30 ENCOUNTER — Telehealth: Payer: Self-pay | Admitting: Physician Assistant

## 2017-12-30 ENCOUNTER — Other Ambulatory Visit: Payer: Self-pay | Admitting: Physician Assistant

## 2017-12-30 NOTE — Telephone Encounter (Signed)
Copied from [place]. Topic: Quick Communication - Rx Refill/Question >> Dec 30, 2017  9:23 AM Pricilla Handler wrote: Medication: chlorthalidone (HYGROTON) 25 MG tablet Has the patient contacted their pharmacy? Yes.   (Agent: If no, request that the patient contact the pharmacy for the refill.) Preferred Pharmacy (with phone number or street name): [place], [place] - [address] [phone] (Phone) [phone] (Fax)  PATIENT IS COMPLETELY OUT OF THIS MEDICATION. Patient has been trying to get this refill for three days. Agent: Please be advised that RX refills may take up to 3 business days. We ask that you follow-up with your pharmacy.

## 2018-01-02 ENCOUNTER — Other Ambulatory Visit: Payer: Self-pay | Admitting: Physician Assistant

## 2018-01-02 ENCOUNTER — Telehealth: Payer: Self-pay | Admitting: Emergency Medicine

## 2018-01-02 DIAGNOSIS — E876 Hypokalemia: Secondary | ICD-10-CM

## 2018-01-02 NOTE — Telephone Encounter (Signed)
Spoke with patient about Potassium dosage. He has been currently taking his Potassium 10 meq in am and 20 meq in evening. 30 meq daily. He has been doing that the last 4-5 days. Advised to continue dosage and will recheck levels on Friday. BMP order placed.  Copied from Amanda. Topic: Inquiry >> Dec 30, 2017  9:30 AM Pricilla Handler wrote: Reason for CRM: Patient stated that [name] wants him to have a potassium lab completed to verify if the medication is working. Patient also stated that he was to hear back from someone, but has not at this time. Patient wants a call back this morning concerning the lab.

## 2018-01-03 ENCOUNTER — Telehealth: Payer: Self-pay | Admitting: Cardiology

## 2018-01-03 NOTE — Telephone Encounter (Signed)
Pt called for  MULTAQ 400mg  samples and would like to know how long he should take this.  Pt has 2 pills left.

## 2018-01-04 MED ORDER — APIXABAN 5 MG PO TABS: 5.0000 mg | ORAL_TABLET | Freq: Two times a day (BID) | ORAL | 3 refills | Status: DC

## 2018-01-04 MED ORDER — RIVAROXABAN 20 MG PO TABS: 20.0000 mg | ORAL_TABLET | Freq: Every day | ORAL | 6 refills | Status: DC

## 2018-01-04 NOTE — Telephone Encounter (Signed)
**Note De-Identified  Obfuscation** Clarification: What mg and frequency of Xarelto and/or Pradaxa.

## 2018-01-04 NOTE — Telephone Encounter (Signed)
If trouble affording Eliquis, could try Xarelto or Pradaxa. Coumadin as last option.

## 2018-01-04 NOTE — Telephone Encounter (Addendum)
I called the pts pharmacy, [facility], and was advised that a 30 day supply of Xarelto will cost the pt $344.55 after his insurance was applied, a 30 day supply of Pradaxa will cost him $182.36 after insurance was applied and  Eliquis is only $37.00 for a 30 day supply.  I called the pt to discuss and he stated that he just cannot afford $37 a month for his Eliquis because he is on a fixed income and that there is no way he can afford Pradaxa or Xarelto.   I discussed Coumadin with him and he stated that if it is less expensive than the other medications then that's what he wants to take. He is aware that he will have to have his INRs checked regularly and follow a diet and he still wants to switch to Coumadin.  He is also aware that someone from our [facility] will be contacting him to arrange an appointment with them and that Dr [name] nurse, Venida Jarvis, will be calling him to discuss alternatives for Multaq.  He verbalized understanding and is in agreement with all of above.  The pt states that he only has 1 Multaq left and is requesting samples. He is advised that I will leave a couple Multaq samples in the front office and that he can pick up at his convenience. He verbalized understanding and thanked me for my help.

## 2018-01-04 NOTE — Addendum Note (Signed)
**Note De-Identified [place] Obfuscation** Addended by: Dennie Fetters on: 01/04/2018 03:13 PM   Modules accepted: Orders

## 2018-01-04 NOTE — Addendum Note (Signed)
Addended by: Dennie Fetters on: 01/04/2018 02:36 PM   Modules accepted: Orders

## 2018-01-04 NOTE — Telephone Encounter (Signed)
Denial on pt assistance for Eliquis received via fax from [place].  Reason: Product is covered by the pts insurance.  Will forward to Dr [name] and his nurse Venida Jarvis as Juluis Rainier.

## 2018-01-04 NOTE — Telephone Encounter (Signed)
**Note De-Identified  Obfuscation** Per Dr [name] nurse, Sherri, the pt can switch to Xarelto 20 mg qd or Pradaxa 150 mg BID.

## 2018-01-05 MED ORDER — DRONEDARONE HCL 400 MG PO TABS: 400.0000 mg | ORAL_TABLET | Freq: Two times a day (BID) | ORAL | 0 refills | Status: DC

## 2018-01-05 MED ORDER — FLECAINIDE ACETATE 100 MG PO TABS: 100.0000 mg | ORAL_TABLET | Freq: Two times a day (BID) | ORAL | 0 refills | Status: DC

## 2018-01-05 NOTE — Telephone Encounter (Signed)
Sending in Rx for Multaq & Flecainide to get [facility]rough[facility] quotes from pharmacy.  Will not fill either medication at this time -- this is strictly for cost determination/affordablitly.

## 2018-01-05 NOTE — Telephone Encounter (Signed)
Spoke to patient in more detail about medications/decisions. We discussed coumadin in more detail and that with weekly checks and co-pays, this would actually be more expensive than Eliquis.  Patient is agreeable to staying on Eliquis at this time. We also discussed Multaq/Flecainide.  Flecainide will only cost the patient $10 mo and he is agreeable to starting this being that it is much more affordable than Multaq at $344. Informed that he would also start Metoprolol w/ Flecainide. He understands office will call him to arrange GXT testing in several weeks.  He also understands I will call him soon, after GXT is scheduled, to go over instructions and when to stop Multaq and start Flecainide.

## 2018-01-05 NOTE — Telephone Encounter (Signed)
Spoke with pt, aware samples at the front desk including the patient assistance paperwork placed at the front desk for pick up.

## 2018-01-05 NOTE — Addendum Note (Signed)
Addended by: Stanton Kidney on: 01/05/2018 12:10 PM   Modules accepted: Orders

## 2018-01-06 ENCOUNTER — Other Ambulatory Visit (INDEPENDENT_AMBULATORY_CARE_PROVIDER_SITE_OTHER): Payer: Medicare Other

## 2018-01-06 DIAGNOSIS — E876 Hypokalemia: Secondary | ICD-10-CM

## 2018-01-06 LAB — BASIC METABOLIC PANEL
BUN: 17 mg/dL (ref 6–23)
CHLORIDE: 101 meq/L (ref 96–112)
CO2: 31 mEq/L (ref 19–32)
Calcium: 9.1 mg/dL (ref 8.4–10.5)
Creatinine, Ser: 1.41 mg/dL (ref 0.40–1.50)
GFR: 63.38 mL/min (ref >60.00)
GLUCOSE: 92 mg/dL (ref 70–99)
POTASSIUM: 3.6 meq/L (ref 3.5–5.1)
Sodium: 139 mEq/L (ref 135–145)

## 2018-01-09 NOTE — Telephone Encounter (Signed)
**Note De-Identified  Obfuscation** Will forward message to Sherri, Dr [name] nurse as Juluis Rainier.

## 2018-01-12 ENCOUNTER — Telehealth: Payer: Self-pay | Admitting: *Deleted

## 2018-01-12 MED ORDER — FLECAINIDE ACETATE 100 MG PO TABS: 100.0000 mg | ORAL_TABLET | Freq: Two times a day (BID) | ORAL | 3 refills | Status: DC

## 2018-01-12 NOTE — Telephone Encounter (Addendum)
Pt switching from Multaq to Flecainide. Advised to: - take last dose of Multaq tonight. - start Flecainide Monday 2/25 - GXT scheduled for 3/7  Patient verbalized understanding and agreeable to plan.

## 2018-01-26 ENCOUNTER — Ambulatory Visit (INDEPENDENT_AMBULATORY_CARE_PROVIDER_SITE_OTHER): Payer: Medicare Other

## 2018-01-26 DIAGNOSIS — I481 Persistent atrial fibrillation: Secondary | ICD-10-CM | POA: Diagnosis not present

## 2018-01-26 DIAGNOSIS — Z79899 Other long term (current) drug therapy: Secondary | ICD-10-CM | POA: Diagnosis not present

## 2018-01-26 DIAGNOSIS — I4819 Other persistent atrial fibrillation: Secondary | ICD-10-CM

## 2018-01-26 LAB — EXERCISE TOLERANCE TEST
CSEPED: 1 min
CSEPEW: 3.2 METS
Exercise duration (sec): 14 s
MPHR: 148 {beats}/min
Peak HR: 126 {beats}/min
Percent HR: 85 %
RPE: 15
Rest HR: 67 {beats}/min

## 2018-01-27 ENCOUNTER — Telehealth: Payer: Self-pay | Admitting: *Deleted

## 2018-01-27 NOTE — Telephone Encounter (Signed)
Left detailed message advising patient to stop Flecainide tonight. Informed that I would call him Monday with further instructions.  (start Amiodarone)

## 2018-01-28 ENCOUNTER — Other Ambulatory Visit: Payer: Self-pay | Admitting: Physician Assistant

## 2018-01-30 ENCOUNTER — Other Ambulatory Visit: Payer: Self-pay | Admitting: Physician Assistant

## 2018-01-30 MED ORDER — AMIODARONE HCL 200 MG PO TABS: 200.0000 mg | ORAL_TABLET | Freq: Every day | ORAL | 3 refills | Status: DC

## 2018-01-30 MED ORDER — AMIODARONE HCL 200 MG PO TABS: ORAL_TABLET | ORAL | 0 refills | Status: DC

## 2018-01-30 NOTE — Telephone Encounter (Signed)
Pt stopped Flecainide Friday 3/8. Advised to start Amiodarone tomorrow:    Take 2 tablets BID x 2 weeks, then    Take 1 tablet BID x 2 weeks, then    Take 1 tablet QD Post Amiodarone initiation appt made for 5/28 w/ Dr. [name] SE/things to monitor while taking Amiodarone discussed with the patient. Pt will call the office if SE begin after starting medication and/or medication is not working. Patient verbalized understanding and agreeable to plan.

## 2018-01-30 NOTE — Telephone Encounter (Signed)
Follow up  ° ° °Patient is returning call.  °

## 2018-01-30 NOTE — Telephone Encounter (Signed)
[place]

## 2018-01-30 NOTE — Telephone Encounter (Signed)
#  30 was sent to the pharmacy on yesterday to the pharmacy. Patient due for a follow up appointment.

## 2018-02-01 NOTE — Telephone Encounter (Signed)
Appointment scheduled for 02/13/18 with PCP

## 2018-02-09 ENCOUNTER — Telehealth: Payer: Self-pay

## 2018-02-09 NOTE — Telephone Encounter (Signed)
   [facility] Pre-operative Risk Assessment    Request for surgical clearance:  1. What type of surgery is being performed? Epidural  Steroid Injection  2. When is this surgery scheduled? 03/01/18   3. What type of clearance is required (medical clearance vs. Pharmacy clearance to hold med vs. Both)? pharmacy  4. Are there any medications that need to be held prior to surgery and how long? Aspirin, 1 week prior   5. Practice name and name of physician performing surgery? [place], Dr. Arrie Eastern   6. What is your office phone and fax number? [phone], fax: [phone]   7. Anesthesia type (None, local, MAC, general) ? Not listed    Joaquim Lai 02/09/2018, 12:39 PM  _________________________________________________________________   (provider comments below)

## 2018-02-10 ENCOUNTER — Telehealth: Payer: Self-pay | Admitting: Cardiology

## 2018-02-10 NOTE — Telephone Encounter (Signed)
   Primary Cardiologist: [name], MD  Need recommendations from [facility] regarding holding Eliquis for epidural steroid injection.  Chart reviewed as part of pre-operative protocol coverage.   He has a history of paroxysmal atrial fibrillation/flutter, s/p prior DCCV, hypertension.  He has multiple drug intolerances.  Most recently he has been placed on Amiodarone for rhythm control.  He stopped his anticoagulation last year and started on an ASA.  However, he was placed back on anticoagulation with Eliquis (Apixaban) 04/25/17.  Review of his chart indicates his ASA was never stopped after resuming Eliquis.   CHADS2-VASc=4 (HTN, age x 1, CVA).      Preop Call Back Staff: Please call patient and ask him to stop taking ASA.  As long as he is on Eliquis, he does not need to take ASA as well. As above, we are awaiting recommendations from pharmacy regarding how long to hold his Eliquis for his procedure.  Richardson Dopp, PA-C 02/10/2018, 11:41 AM

## 2018-02-10 NOTE — Telephone Encounter (Signed)
I lmptcb to go over pre op instructions. Pt will need to hold Eliquis 3 days prior to injection. Per Richardson Dopp, PA pt also needs to d/c ASA due to he is on Eliquis now. I also s/w Sammy today at [place]. I advised Sammy of instructions in regards to ASA and Eliquis. I will fax this over as well to Sammy as FYI. Sammy thanked me for the update.

## 2018-02-10 NOTE — Telephone Encounter (Signed)
Pt takes Eliquis for afib with CHADS2VASc score or 4 (HTN, age, CVA). CrCl is 27mL/min. Since pt's previous stroke was hemorrhagic rather than ischemic, ok to hold Eliquis for 3 days prior to [facility] per protocol.

## 2018-02-10 NOTE — Telephone Encounter (Signed)
Patrick Roth is returning a call . Thanks

## 2018-02-13 ENCOUNTER — Ambulatory Visit: Payer: Medicare Other | Admitting: Physician Assistant

## 2018-02-13 ENCOUNTER — Encounter: Payer: Self-pay | Admitting: Physician Assistant

## 2018-02-13 ENCOUNTER — Other Ambulatory Visit: Payer: Self-pay

## 2018-02-13 VITALS — BP 130/80 | HR 56 | Temp 97.8°F | Resp 16 | Ht 69.0 in | Wt 213.0 lb

## 2018-02-13 DIAGNOSIS — I10 Essential (primary) hypertension: Secondary | ICD-10-CM | POA: Diagnosis not present

## 2018-02-13 LAB — COMPREHENSIVE METABOLIC PANEL
ALBUMIN: 3.7 g/dL (ref 3.5–5.2)
ALK PHOS: 74 U/L (ref 39–117)
ALT: 19 U/L (ref 0–53)
AST: 23 U/L (ref 0–37)
BUN: 15 mg/dL (ref 6–23)
CHLORIDE: 101 meq/L (ref 96–112)
CO2: 30 mEq/L (ref 19–32)
Calcium: 9.1 mg/dL (ref 8.4–10.5)
Creatinine, Ser: 1.41 mg/dL (ref 0.40–1.50)
GFR: 63.36 mL/min (ref >60.00)
Glucose, Bld: 96 mg/dL (ref 70–99)
POTASSIUM: 4 meq/L (ref 3.5–5.1)
Sodium: 138 mEq/L (ref 135–145)
TOTAL PROTEIN: 7 g/dL (ref 6.0–8.3)
Total Bilirubin: 1.1 mg/dL (ref 0.2–1.2)

## 2018-02-13 LAB — LIPID PANEL
CHOLESTEROL: 197 mg/dL (ref 0–200)
HDL: 50.6 mg/dL (ref >39.00)
LDL CALC: 137 mg/dL — AB (ref 0–99)
NonHDL: 146.78
Total CHOL/HDL Ratio: 4
Triglycerides: 50 mg/dL (ref 0.0–149.0)
VLDL: 10 mg/dL (ref 0.0–40.0)

## 2018-02-13 NOTE — Assessment & Plan Note (Signed)
BP normotensive. Asymptomatic. Continue Chlorthalidone. Will check CMP and Lipids today.

## 2018-02-13 NOTE — Progress Notes (Signed)
Patient presents to clinic today for follow-up of hypertension and hyperlipidemia. Is currently on a regimen of Chlorthalidone and Potassium Chloride. Is taking as directed without side effect. Notes home BP ranging 120-130/70s. Patient denies chest pain, palpitations, lightheadedness, dizziness, vision changes or frequent headaches. Is followed by Cardiology for atrial fibrillation. Is currently on Amiodarone and Eliquis, taking as directed.   Past Medical History:  Diagnosis Date  . Colon polyps   . History of chicken pox   . Hyperlipidemia   . Hypertension   . Internal hemorrhoids   . Irregular heartbeat   . Sciatica   . Stroke [facility]) 2008    Current Outpatient Medications on File Prior to Visit  Medication Sig Dispense Refill  . amiodarone (PACERONE) 200 MG tablet Take 2 tablets (400 mg total) twice a day for 2 weeks, then take 1 tablet (200 mg total) twice a day for 2 weeks 84 tablet 0  . apixaban (ELIQUIS) 5 MG TABS tablet Take 1 tablet (5 mg total) by mouth 2 (two) times daily. 60 tablet 3  . aspirin 81 MG tablet Take 81 mg by mouth daily.    . chlorthalidone (HYGROTON) 25 MG tablet TAKE 1 TABLET BY MOUTH ONCE DAILY 90 tablet 0  . doxazosin (CARDURA) 1 MG tablet Take 1 mg by mouth daily.    Marland Kitchen gabapentin (NEURONTIN) 100 MG capsule TAKE 1 CAPSULE BY MOUTH IN THE MORNING, 1 CAPSULE IN THE AFTERNOON AND 3 CAPSULES IN THE EVENING 150 capsule 3  . potassium chloride SA (KLOR-CON M20) 20 MEQ tablet Take 1 tablet in morning and 2 tablets in evening (30 meq) 270 tablet 1  . potassium chloride (K-DUR) 10 MEQ tablet Take 1 tablet in morning and 2 tablet in evening (Patient not taking: Reported on 02/13/2018) 90 tablet 3   No current facility-administered medications on file prior to visit.     Allergies  Allergen Reactions  . Crestor [Rosuvastatin Calcium] Shortness Of Breath    Muscle cramps  . Spironolactone Shortness Of Breath  . Terazosin Shortness Of Breath, Palpitations and  Other (See Comments)    Nerve pain  . Atenolol Other (See Comments)    Drowsiness and "flu sxs"  . Bystolic [Nebivolol Hcl] Other (See Comments)    GI issues  . Hydralazine Other (See Comments)    Joint swelling  . Tizanidine Other (See Comments)    Syncope, Elevated BP/pulse  . Amlodipine Other (See Comments)  . Clonidine Other (See Comments)  . Hydrocodone-Acetaminophen Other (See Comments)  . Levofloxacin Other (See Comments)  . Losartan Other (See Comments)    Did not work.  Pt states he couldn't take b/c of SE, but can't remember what SE were.  . Maxzide [Hydrochlorothiazide W-Triamterene] Other (See Comments)    Does not tolerate potassium-sparing diuretics  . Olmesartan Other (See Comments)  . Tussionex Pennkinetic Er [Hydrocod Polst-Cpm Polst Er] Other (See Comments)    Other reaction(s): Unknown    Family History  Problem Relation Age of Onset  . Hypertension Father 45       Deceased  . Heart disease Father   . Lung cancer Mother 57       Deceased  . Healthy Sister        x3  . Healthy Brother        x4  . Heart disease Brother        #5  . Other Daughter        Alpha Thalassemia  . Lupus  Daughter        #2-deceased  . Diabetes Neg Hx   . Heart attack Neg Hx   . Hyperlipidemia Neg Hx   . Sudden death Neg Hx     Social History   Socioeconomic History  . Marital status: Married    Spouse name: Not on file  . Number of children: 1  . Years of education: 13  . Highest education level: Not on file  Occupational History  . Occupation: Retired  Scientific laboratory technician  . Financial resource strain: Not on file  . Food insecurity:    Worry: Not on file    Inability: Not on file  . Transportation needs:    Medical: Not on file    Non-medical: Not on file  Tobacco Use  . Smoking status: Never Smoker  . Smokeless tobacco: Never Used  Substance and Sexual Activity  . Alcohol use: No  . Drug use: No  . Sexual activity: Not on file  Lifestyle  . Physical  activity:    Days per week: Not on file    Minutes per session: Not on file  . Stress: Not on file  Relationships  . Social connections:    Talks on phone: Not on file    Gets together: Not on file    Attends religious service: Not on file    Active member of club or organization: Not on file    Attends meetings of clubs or organizations: Not on file    Relationship status: Not on file  Other Topics Concern  . Not on file  Social History Narrative   Fun: Play golf and build furniture.    Review of Systems - See HPI.  All other ROS are negative.  BP 130/80   Pulse (!) 56   Temp 97.8 F (36.6 C) (Oral)   Resp 16   Ht 5\' 9"  (1.753 m)   Wt 213 lb (96.6 kg)   SpO2 99%   BMI 31.45 kg/m   Physical Exam  Constitutional: He is oriented to person, place, and time and well-developed, well-nourished, and in no distress.  HENT:  Head: Normocephalic and atraumatic.  Cardiovascular: Normal rate, regular rhythm, normal heart sounds and intact distal pulses.  Pulmonary/Chest: Effort normal and breath sounds normal. No respiratory distress. He has no wheezes. He has no rales. He exhibits no tenderness.  Neurological: He is alert and oriented to person, place, and time.  Skin: Skin is warm and dry. No rash noted.  Psychiatric: Affect normal.    Recent Results (from the past 2160 hour(s))  Basic Metabolic Panel (BMET)     Status: Abnormal   Collection Time: 11/17/17  8:28 AM  Result Value Ref Range   Sodium 139 135 - 145 mEq/L   Potassium 3.0 (L) 3.5 - 5.1 mEq/L   Chloride 101 96 - 112 mEq/L   CO2 30 19 - 32 mEq/L   Glucose, Bld 90 70 - 99 mg/dL   BUN 16 6 - 23 mg/dL   Creatinine, Ser 1.51 (H) 0.40 - 1.50 mg/dL   Calcium 8.9 8.4 - 10.5 mg/dL   GFR 58.58 (L) >60.00 mL/min  Magnesium     Status: None   Collection Time: 11/21/17  9:15 AM  Result Value Ref Range   Magnesium 1.9 1.5 - 2.5 mg/dL  Basic metabolic panel     Status: Abnormal   Collection Time: 12/13/17  9:50 AM  Result  Value Ref Range   Sodium 138 135 -  145 mEq/L   Potassium 3.3 (L) 3.5 - 5.1 mEq/L   Chloride 99 96 - 112 mEq/L   CO2 32 19 - 32 mEq/L   Glucose, Bld 101 (H) 70 - 99 mg/dL   BUN 15 6 - 23 mg/dL   Creatinine, Ser 1.40 0.40 - 1.50 mg/dL   Calcium 9.3 8.4 - 10.5 mg/dL   GFR 63.91 >60.00 mL/min  Basic metabolic panel     Status: None   Collection Time: 01/06/18  8:24 AM  Result Value Ref Range   Sodium 139 135 - 145 mEq/L   Potassium 3.6 3.5 - 5.1 mEq/L   Chloride 101 96 - 112 mEq/L   CO2 31 19 - 32 mEq/L   Glucose, Bld 92 70 - 99 mg/dL   BUN 17 6 - 23 mg/dL   Creatinine, Ser 1.41 0.40 - 1.50 mg/dL   Calcium 9.1 8.4 - 10.5 mg/dL   GFR 63.38 >60.00 mL/min  Exercise Tolerance Test     Status: None   Collection Time: 01/26/18 10:47 AM  Result Value Ref Range   Rest HR 67 bpm   Rest BP 178/104 mmHg   RPE 15    Exercise duration (sec) 14 sec   Percent HR 85 %   Exercise duration (min) 1 min   Estimated workload 3.2 METS   Peak HR 126 bpm   Peak BP 203/112 mmHg   MPHR 148 bpm    Assessment/Plan: Essential hypertension BP normotensive. Asymptomatic. Continue Chlorthalidone. Will check CMP and Lipids today.    Leeanne Rio, PA-C

## 2018-02-13 NOTE — Patient Instructions (Signed)
Please go to the lab today for blood work.  I will call you with your results. We will alter treatment regimen(s) if indicated by your results.    Hypertension Hypertension is another name for high blood pressure. High blood pressure forces your heart to work harder to pump blood. This can cause problems over time. There are two numbers in a blood pressure reading. There is a top number (systolic) over a bottom number (diastolic). It is best to have a blood pressure below 120/80. Healthy choices can help lower your blood pressure. You may need medicine to help lower your blood pressure if:  Your blood pressure cannot be lowered with healthy choices.  Your blood pressure is higher than 130/80.  Follow these instructions at home: Eating and drinking  If directed, follow the DASH eating plan. This diet includes: ? Filling half of your plate at each meal with fruits and vegetables. ? Filling one quarter of your plate at each meal with whole grains. Whole grains include whole wheat pasta, brown rice, and whole grain bread. ? Eating or drinking low-fat dairy products, such as skim milk or low-fat yogurt. ? Filling one quarter of your plate at each meal with low-fat (lean) proteins. Low-fat proteins include fish, skinless chicken, eggs, beans, and tofu. ? Avoiding fatty meat, cured and processed meat, or chicken with skin. ? Avoiding premade or processed food.  Eat less than 1,500 mg of salt (sodium) a day.  Limit alcohol use to no more than 1 drink a day for nonpregnant women and 2 drinks a day for men. One drink equals 12 oz of beer, 5 oz of wine, or 1 oz of hard liquor. Lifestyle  Work with your doctor to stay at a healthy weight or to lose weight. Ask your doctor what the best weight is for you.  Get at least 30 minutes of exercise that causes your heart to beat faster (aerobic exercise) most days of the week. This may include walking, swimming, or biking.  Get at least 30 minutes of  exercise that strengthens your muscles (resistance exercise) at least 3 days a week. This may include lifting weights or pilates.  Do not use any products that contain nicotine or tobacco. This includes cigarettes and e-cigarettes. If you need help quitting, ask your doctor.  Check your blood pressure at home as told by your doctor.  Keep all follow-up visits as told by your doctor. This is important. Medicines  Take over-the-counter and prescription medicines only as told by your doctor. Follow directions carefully.  Do not skip doses of blood pressure medicine. The medicine does not work as well if you skip doses. Skipping doses also puts you at risk for problems.  Ask your doctor about side effects or reactions to medicines that you should watch for. Contact a doctor if:  You think you are having a reaction to the medicine you are taking.  You have headaches that keep coming back (recurring).  You feel dizzy.  You have swelling in your ankles.  You have trouble with your vision. Get help right away if:  You get a very bad headache.  You start to feel confused.  You feel weak or numb.  You feel faint.  You get very bad pain in your: ? Chest. ? Belly (abdomen).  You throw up (vomit) more than once.  You have trouble breathing. Summary  Hypertension is another name for high blood pressure.  Making healthy choices can help lower blood pressure. If  your blood pressure cannot be controlled with healthy choices, you may need to take medicine. This information is not intended to replace advice given to you by your health care provider. Make sure you discuss any questions you have with your health care provider. Document Released: 04/26/2008 Document Revised: 10/06/2016 Document Reviewed: 10/06/2016 Elsevier Interactive Patient Education  [employer].

## 2018-02-16 ENCOUNTER — Other Ambulatory Visit: Payer: Self-pay | Admitting: Physician Assistant

## 2018-02-16 DIAGNOSIS — G629 Polyneuropathy, unspecified: Secondary | ICD-10-CM

## 2018-02-20 ENCOUNTER — Ambulatory Visit: Payer: Medicare Other | Admitting: Physician Assistant

## 2018-02-20 ENCOUNTER — Ambulatory Visit: Payer: Self-pay

## 2018-02-20 ENCOUNTER — Telehealth: Payer: Self-pay | Admitting: Cardiology

## 2018-02-20 ENCOUNTER — Telehealth: Payer: Self-pay | Admitting: *Deleted

## 2018-02-20 NOTE — Telephone Encounter (Signed)
Spoke with patient and he does feel like this is more related to the medication changes from his Cardiologist.  He is not currently having symptoms and would like to speak with someone in the Cardiology office.   I am routing note to RN for Dr. [name] and asking that someone from their office call him.  I have spoken with Scheduling for [place] and she asked that I send the note and let the RN decide when patient needed to come in.   Patient advised to go to ED if he develops symptoms, patient stated verbal understanding.

## 2018-02-20 NOTE — Telephone Encounter (Addendum)
Katina Dung, CMA  to Me       02/20/18 3:36 PM  Hi Greogry Goodwyn! I spoke with scheduling trying to make an appointment for patient. They asked that I send the note to you and let you decide when patient could be seen. Patient is aware that I am sending the note and asking for a phone call from your office to schedule     Katina Dung, [facility]    02/20/18 3:24 PM  Note    Spoke with patient and he does feel like this is more related to the medication changes from his Cardiologist.  He is not currently having symptoms and would like to speak with someone in the Cardiology office.   I am routing note to RN for Dr. [name] and asking that someone from their office call him.  I have spoken with Scheduling for [place] and she asked that I send the note and let the RN decide when patient needed to come in.   Patient advised to go to ED if he develops symptoms, patient stated verbal understanding.

## 2018-02-20 NOTE — Telephone Encounter (Signed)
New Message °

## 2018-02-20 NOTE — Telephone Encounter (Signed)
Pt. C/o dizziness today while "doing some work". BP 140/78  PULSE 56. States "I feel like when my afib started. I was on some medicine for it, but the doctor took me off of it 3 weeks ago." States he stopped working and rested and dizziness has gone away. Appointment made for today.  Reason for Disposition . [1] MODERATE dizziness (e.g., interferes with normal activities) AND [2] has NOT been evaluated by physician for this  (Exception: dizziness caused by heat exposure, sudden standing, or poor fluid intake)  Answer Assessment - Initial Assessment Questions 1. DESCRIPTION: "Describe your dizziness."     Dizzy 2. LIGHTHEADED: "Do you feel lightheaded?" (e.g., somewhat faint, woozy, weak upon standing)      Lightheaded 3. VERTIGO: "Do you feel like either you or the room is spinning or tilting?" (i.e. vertigo)     No 4. SEVERITY: "How bad is it?"  "Do you feel like you are going to faint?" "Can you stand and walk?"   - MILD - walking normally   - MODERATE - interferes with normal activities (e.g., work, school)    - SEVERE - unable to stand, requires support to walk, feels like passing out now.      Moderate 5. ONSET:  "When did the dizziness begin?"     Started today 6. AGGRAVATING FACTORS: "Does anything make it worse?" (e.g., standing, change in head position)     Rest made it better 7. HEART RATE: "Can you tell me your heart rate?" "How many beats in 15 seconds?"  (Note: not all patients can do this)       56 8. CAUSE: "What do you think is causing the dizziness?"     mAYBE MY AFIB 9. RECURRENT SYMPTOM: "Have you had dizziness before?" If so, ask: "When was the last time?" "What happened that time?"     yES 10. OTHER SYMPTOMS: "Do you have any other symptoms?" (e.g., fever, chest pain, vomiting, diarrhea, bleeding)       nO 11. PREGNANCY: "Is there any chance you are pregnant?" "When was your last menstrual period?"       No  Protocols used: DIZZINESS [facility]

## 2018-02-20 NOTE — Telephone Encounter (Signed)
Spoke to patient who reports feeling terrible. The past several weeks he hasn't felt right. Reports this morning he felt like he couldn't breathe, felt like he was going to pass out.  The feelings are similar to when he feels AFib. Dizziness/light-headedness subsides with rest.   HRs in 50-60s. Pt recently started on Amiodarone. Pt scheduled to see Roderic Palau, NP in the AFib clinic tomorrow to evaluate further. Patient verbalized understanding and agreeable to plan.

## 2018-02-21 ENCOUNTER — Ambulatory Visit (HOSPITAL_COMMUNITY)
Admission: RE | Admit: 2018-02-21 | Discharge: 2018-02-21 | Disposition: A | Discharge status: Home / Self Care | Payer: Medicare Other | Source: Ambulatory Visit | Attending: Nurse Practitioner | Admitting: Nurse Practitioner

## 2018-02-21 ENCOUNTER — Encounter (HOSPITAL_COMMUNITY): Payer: Self-pay | Admitting: Nurse Practitioner

## 2018-02-21 ENCOUNTER — Telehealth (HOSPITAL_COMMUNITY): Payer: Self-pay | Admitting: Nurse Practitioner

## 2018-02-21 VITALS — BP 152/84 | HR 56 | Ht 69.0 in | Wt 214.0 lb

## 2018-02-21 DIAGNOSIS — Z8673 Personal history of transient ischemic attack (TIA), and cerebral infarction without residual deficits: Secondary | ICD-10-CM | POA: Diagnosis not present

## 2018-02-21 DIAGNOSIS — Z8249 Family history of ischemic heart disease and other diseases of the circulatory system: Secondary | ICD-10-CM | POA: Diagnosis not present

## 2018-02-21 DIAGNOSIS — R0602 Shortness of breath: Secondary | ICD-10-CM | POA: Diagnosis not present

## 2018-02-21 DIAGNOSIS — I480 Paroxysmal atrial fibrillation: Secondary | ICD-10-CM | POA: Diagnosis not present

## 2018-02-21 DIAGNOSIS — Z7901 Long term (current) use of anticoagulants: Secondary | ICD-10-CM | POA: Insufficient documentation

## 2018-02-21 DIAGNOSIS — Z9889 Other specified postprocedural states: Secondary | ICD-10-CM | POA: Diagnosis not present

## 2018-02-21 DIAGNOSIS — Z801 Family history of malignant neoplasm of trachea, bronchus and lung: Secondary | ICD-10-CM | POA: Diagnosis not present

## 2018-02-21 DIAGNOSIS — I481 Persistent atrial fibrillation: Secondary | ICD-10-CM | POA: Diagnosis not present

## 2018-02-21 DIAGNOSIS — I452 Bifascicular block: Secondary | ICD-10-CM | POA: Insufficient documentation

## 2018-02-21 DIAGNOSIS — Z888 Allergy status to other drugs, medicaments and biological substances status: Secondary | ICD-10-CM | POA: Diagnosis not present

## 2018-02-21 DIAGNOSIS — R0600 Dyspnea, unspecified: Secondary | ICD-10-CM

## 2018-02-21 DIAGNOSIS — I10 Essential (primary) hypertension: Secondary | ICD-10-CM | POA: Diagnosis not present

## 2018-02-21 DIAGNOSIS — R0609 Other forms of dyspnea: Secondary | ICD-10-CM

## 2018-02-21 DIAGNOSIS — E785 Hyperlipidemia, unspecified: Secondary | ICD-10-CM | POA: Insufficient documentation

## 2018-02-21 DIAGNOSIS — Z79899 Other long term (current) drug therapy: Secondary | ICD-10-CM | POA: Insufficient documentation

## 2018-02-21 DIAGNOSIS — I4819 Other persistent atrial fibrillation: Secondary | ICD-10-CM

## 2018-02-22 ENCOUNTER — Telehealth (HOSPITAL_COMMUNITY): Payer: Self-pay | Admitting: *Deleted

## 2018-02-22 NOTE — Telephone Encounter (Signed)
Patient given detailed instructions per Myocardial Perfusion Study Information Sheet for the test on 02/24/18 at 7:45. Patient notified to arrive 15 minutes early and that it is imperative to arrive on time for appointment to keep from having the test rescheduled.  If you need to cancel or reschedule your appointment, please call the office within 24 hours of your appointment. . Patient verbalized understanding.Patrick Roth

## 2018-02-22 NOTE — Progress Notes (Addendum)
Primary Care Physician: Delorse Limber Referring Physician: [facility] Triage Cardiologist:Dr. [name] EP: Dr. Christiana Pellant is a 72 y.o. male with a h/o afib, htn, CVA, afib currently in SR on amiodarone. He called [employer] office complaining  of an episode of shortness of breath while he was actively working with his arms over his head. He states that he had that same senstation of shortness of breath when he was in afib and going up inclines. However, his HR and BP were normal with this last episode and it took about an hour for him to feel back at baseline. He has had many of these episodes usually with exertion over the last few years. He had a ETT early March but pt had to stop for shortness of breath and Hypertensive response. He was only able to exercise 1.1 min.  Today, he denies symptoms of palpitations, chest pain, shortness of breath, orthopnea, PND, lower extremity edema, dizziness, presyncope, syncope, or neurologic sequela. The patient is tolerating medications without difficulties and is otherwise without complaint today.   Past Medical History:  Diagnosis Date  . Colon polyps   . History of chicken pox   . Hyperlipidemia   . Hypertension   . Internal hemorrhoids   . Irregular heartbeat   . Sciatica   . Stroke [facility]) 2008   Past Surgical History:  Procedure Laterality Date  . CARDIOVERSION N/A 09/02/2017   Procedure: CARDIOVERSION;  Surgeon: [name], MD;  Location: [facility] ENDOSCOPY;  Service: Cardiovascular;  Laterality: N/A;  . COLONOSCOPY  2011  . great toe surgery    . PROSTATE BIOPSY      Current Outpatient Medications  Medication Sig Dispense Refill  . amiodarone (PACERONE) 200 MG tablet Take 200 mg by mouth 2 (two) times daily.    Marland Kitchen apixaban (ELIQUIS) 5 MG TABS tablet Take 1 tablet (5 mg total) by mouth 2 (two) times daily. 60 tablet 3  . chlorthalidone (HYGROTON) 25 MG tablet TAKE 1 TABLET BY MOUTH ONCE DAILY 90 tablet 0  . doxazosin  (CARDURA) 1 MG tablet Take 1 mg by mouth daily.    Marland Kitchen gabapentin (NEURONTIN) 100 MG capsule TAKE 1 CAPSULE BY MOUTH IN THE MORNING, 1 CAPSULE IN THE AFTERNOON AND 3 CAPSULES IN THE EVENING (Patient taking differently: take 4 capsules at night) 150 capsule 3  . potassium chloride SA (K-DUR,KLOR-CON) 20 MEQ tablet Take 20 mEq by mouth 2 (two) times daily.     No current facility-administered medications for this encounter.     Allergies  Allergen Reactions  . Crestor [Rosuvastatin Calcium] Shortness Of Breath    Muscle cramps  . Spironolactone Shortness Of Breath  . Terazosin Shortness Of Breath, Palpitations and Other (See Comments)    Nerve pain  . Atenolol Other (See Comments)    Drowsiness and "flu sxs"  . Bystolic [Nebivolol Hcl] Other (See Comments)    GI issues  . Hydralazine Other (See Comments)    Joint swelling  . Tizanidine Other (See Comments)    Syncope, Elevated BP/pulse  . Amlodipine Other (See Comments)  . Clonidine Other (See Comments)  . Hydrocodone-Acetaminophen Other (See Comments)  . Levofloxacin Other (See Comments)  . Losartan Other (See Comments)    Did not work.  Pt states he couldn't take b/c of SE, but can't remember what SE were.  . Maxzide [Hydrochlorothiazide W-Triamterene] Other (See Comments)    Does not tolerate potassium-sparing diuretics  . Olmesartan Other (See  Comments)  . Tussionex Pennkinetic Er [Hydrocod Polst-Cpm Polst Er] Other (See Comments)    Other reaction(s): Unknown    Social History   Socioeconomic History  . Marital status: Married    Spouse name: Not on file  . Number of children: 1  . Years of education: 50  . Highest education level: Not on file  Occupational History  . Occupation: Retired  Scientific laboratory technician  . Financial resource strain: Not on file  . Food insecurity:    Worry: Not on file    Inability: Not on file  . Transportation needs:    Medical: Not on file    Non-medical: Not on file  Tobacco Use  . Smoking  status: Never Smoker  . Smokeless tobacco: Never Used  Substance and Sexual Activity  . Alcohol use: No  . Drug use: No  . Sexual activity: Not on file  Lifestyle  . Physical activity:    Days per week: Not on file    Minutes per session: Not on file  . Stress: Not on file  Relationships  . Social connections:    Talks on phone: Not on file    Gets together: Not on file    Attends religious service: Not on file    Active member of club or organization: Not on file    Attends meetings of clubs or organizations: Not on file    Relationship status: Not on file  . Intimate partner violence:    Fear of current or ex partner: Not on file    Emotionally abused: Not on file    Physically abused: Not on file    Forced sexual activity: Not on file  Other Topics Concern  . Not on file  Social History Narrative   Fun: Play golf and build furniture.     Family History  Problem Relation Age of Onset  . Hypertension Father 67       Deceased  . Heart disease Father   . Lung cancer Mother 57       Deceased  . Healthy Sister        x3  . Healthy Brother        x4  . Heart disease Brother        #5  . Other Daughter        Alpha Thalassemia  . Lupus Daughter        #2-deceased  . Diabetes Neg Hx   . Heart attack Neg Hx   . Hyperlipidemia Neg Hx   . Sudden death Neg Hx     ROS- All systems are reviewed and negative except as per the HPI above  Physical Exam: Vitals:   02/21/18 0919  BP: (!) 152/84  Pulse: (!) 56  Weight: 214 lb (97.1 kg)  Height: 5\' 9"  (1.753 m)   Wt Readings from Last 3 Encounters:  02/21/18 214 lb (97.1 kg)  02/13/18 213 lb (96.6 kg)  12/01/17 213 lb (96.6 kg)    Labs: Lab Results  Component Value Date   NA 138 02/13/2018   K 4.0 02/13/2018   CL 101 02/13/2018   CO2 30 02/13/2018   GLUCOSE 96 02/13/2018   BUN 15 02/13/2018   CREATININE 1.41 02/13/2018   CALCIUM 9.1 02/13/2018   MG 1.9 11/21/2017   Lab Results  Component Value Date    INR 1.1 11/22/2007   Lab Results  Component Value Date   CHOL 197 02/13/2018   HDL 50.60 02/13/2018  LDLCALC 137 (H) 02/13/2018   TRIG 50.0 02/13/2018     GEN- The patient is well appearing, alert and oriented x 3 today.   Head- normocephalic, atraumatic Eyes-  Sclera clear, conjunctiva pink Ears- hearing intact Oropharynx- clear Neck- supple, no JVP Lymph- no cervical lymphadenopathy Lungs- Clear to ausculation bilaterally, normal work of breathing Heart- Regular rate and rhythm, no murmurs, rubs or gallops, PMI not laterally displaced GI- soft, NT, ND, + BS Extremities- no clubbing, cyanosis, or edema MS- no significant deformity or atrophy Skin- no rash or lesion Psych- euthymic mood, full affect Neuro- strength and sensation are intact  EKG-Sinus brady at 56 bpm, RBBB, pr int 232 ms, qrs int 158 ms, qtc 478 ms Epic records reviewed    Assessment and Plan: 1. Paroxysmal afib Pt is currently in SR with amiodarone It sounds as he was in SR with this latest episode of shortness of breath with exertion So I do not think this contributed Continue amiodarone 200 mg qd Continue eliquis 5 mg bid  2. Exertional shortness of breath Worrisome for anginal equivalent He has hd repeated episodes over the last 1-2 years Recent ETT showed very poor exercise tolerance and was stopped 2/2 shortness of breath Imaging study in 2016 low risk Will order lexi Myoview Update Echo  Will be in touch with pt as results come in  F/u with Dr. [name] in May as scheduled  Geroge Baseman. Kebrina Friend, [facility] [address] [place], [place] [address] [phone]

## 2018-02-22 NOTE — Telephone Encounter (Signed)
User: Cherie Dark A Date/time: 02/21/18 10:36 AM  Comment: Called pt and lmsg for him to CB to get sch for an echo and myoview.Vassie Moment  Context:  Outcome: Left Message  Phone number: [phone] Phone Type: Mobile  Comm. type: Telephone Call type: Outgoing  Contact: Arther Dames E Relation to patient: Self

## 2018-02-24 ENCOUNTER — Ambulatory Visit (HOSPITAL_COMMUNITY): Payer: Medicare Other | Attending: Internal Medicine

## 2018-02-24 ENCOUNTER — Other Ambulatory Visit (HOSPITAL_COMMUNITY): Payer: Medicare Other

## 2018-02-24 ENCOUNTER — Other Ambulatory Visit: Payer: Self-pay

## 2018-02-24 ENCOUNTER — Ambulatory Visit (HOSPITAL_BASED_OUTPATIENT_CLINIC_OR_DEPARTMENT_OTHER): Payer: Medicare Other

## 2018-02-24 DIAGNOSIS — I481 Persistent atrial fibrillation: Secondary | ICD-10-CM | POA: Insufficient documentation

## 2018-02-24 DIAGNOSIS — R0609 Other forms of dyspnea: Secondary | ICD-10-CM

## 2018-02-24 DIAGNOSIS — I4819 Other persistent atrial fibrillation: Secondary | ICD-10-CM

## 2018-02-24 DIAGNOSIS — R0600 Dyspnea, unspecified: Secondary | ICD-10-CM

## 2018-02-24 LAB — MYOCARDIAL PERFUSION IMAGING
CHL CUP NUCLEAR SRS: 5
CHL CUP NUCLEAR SSS: 5
CHL CUP RESTING HR STRESS: 50 {beats}/min
LHR: 0.31
LV dias vol: 113 mL (ref 62–150)
LV sys vol: 47 mL
Peak HR: 64 {beats}/min
SDS: 0
TID: 0.9

## 2018-02-24 MED ORDER — TECHNETIUM TC 99M TETROFOSMIN IV KIT
30.8000 | PACK | Freq: Once | INTRAVENOUS | Status: AC | PRN
  Administered 2018-02-24: 30.8 via INTRAVENOUS
  Filled 2018-02-24: qty 31

## 2018-02-24 MED ORDER — TECHNETIUM TC 99M TETROFOSMIN IV KIT
10.6000 | PACK | Freq: Once | INTRAVENOUS | Status: AC | PRN
  Administered 2018-02-24: 10.6 via INTRAVENOUS
  Filled 2018-02-24: qty 11

## 2018-02-24 MED ORDER — REGADENOSON 0.4 MG/5ML IV SOLN
0.4000 mg | Freq: Once | INTRAVENOUS | Status: AC
Start: 2018-02-24 — End: 2018-02-24
  Administered 2018-02-24: 0.4 mg via INTRAVENOUS

## 2018-03-01 DIAGNOSIS — M5416 Radiculopathy, lumbar region: Secondary | ICD-10-CM | POA: Diagnosis not present

## 2018-03-01 DIAGNOSIS — M5136 Other intervertebral disc degeneration, lumbar region: Secondary | ICD-10-CM | POA: Diagnosis not present

## 2018-03-01 DIAGNOSIS — I10 Essential (primary) hypertension: Secondary | ICD-10-CM | POA: Diagnosis not present

## 2018-03-04 ENCOUNTER — Other Ambulatory Visit: Payer: Self-pay | Admitting: Cardiology

## 2018-03-06 ENCOUNTER — Ambulatory Visit: Payer: Medicare Other | Admitting: Cardiology

## 2018-03-08 NOTE — Telephone Encounter (Signed)
This is a A-Fib clinic pt 

## 2018-03-24 ENCOUNTER — Telehealth: Payer: Self-pay | Admitting: Cardiology

## 2018-03-24 NOTE — Telephone Encounter (Signed)
I called the pt back but got no answer and there was no way to leave a message.  Per phone note on 12/20/17 [facility] pt assistance denied the pt assistance with the cost of Eliquis because he has insurance coverage. The pt was agreeable to start taking Coumadin but after discussing with Sherri, Dr [name] nurse, it was decided that Coumadin will cost the pt more than Eliquis due to OV and lab costs.  Also noted on 12/20/17 during same phone note: Per Dr [name] nurse, Sherri, the pt can switch to Xarelto 20 mg qd or Pradaxa 150 mg BID.   Response to above in same phone note from 12/20/17: I called the pts pharmacy, [facility], and was advised that a 30 day supply of Xarelto will cost the pt $344.55 after his insurance was applied, a 30 day supply of Pradaxa will cost him $182.36 after insurance was applied and Eliquis is only $37.00 for a 30 day supply.   I called the pt to discuss and he stated that he just cannot afford $37 a month for his Eliquis because he is on a fixed income and that there is no way he can afford Pradaxa or Xarelto.   I discussed Coumadin with him and he stated that if it is less expensive than the other medications then that's what he wants to take. He is aware that he will have to have his INRs checked regularly and follow a diet and he still wants to switch to Coumadin.  Will forward this message to Sherri, Dr [name] nurse as I am unsure what the plan is at this point as $37 a month for Eliquis is the lowest we will be able to get Eliquis and we cannot continue to provide Eliquis samples to the pt.

## 2018-03-24 NOTE — Telephone Encounter (Signed)
New message:    Pt is calling for assistance for Eliquis. Pt states he is almost out.

## 2018-03-24 NOTE — Telephone Encounter (Signed)
Will route to Prior Auth/Pt Assistance nurse to see if pt can get help with Eliquis.

## 2018-03-27 MED ORDER — APIXABAN 5 MG PO TABS: 5.0000 mg | ORAL_TABLET | Freq: Two times a day (BID) | ORAL | 6 refills | Status: DC

## 2018-03-27 NOTE — Telephone Encounter (Signed)
Again reviewed that initially Coumadin would cost him more than Eliquis, with Coumadin co-pays and weekly INR checks.  Explained that weekly co-pay would cost him 13.20/week not including the cost of the medication. Pt would like to remain on Eliquis. Advised pt to call the office the minute he goes in to "doughnut hole" and we can address coverage for the doughnut hole through company that makes Eliquis. Pt is agreeable to this. Sent refills for Eliquis to requested pharmacy. Pt is appreciative of explaining and talking with him about this. Patient verbalized understanding and agreeable to plan.

## 2018-03-27 NOTE — Telephone Encounter (Signed)
Follow up    Pt c/o medication issue:  1. Name of Medication: apixaban (ELIQUIS) 5 MG TABS tablet  2. How are you currently taking this medication (dosage and times per day)? Take 1 tablet (5 mg total) by mouth 2 (two) times daily.  3. Are you having a reaction (difficulty breathing--STAT)? No  4. What is your medication issue? Patient wants to discuss taking another medication,states Eliquis is too costly

## 2018-04-07 DIAGNOSIS — I129 Hypertensive chronic kidney disease with stage 1 through stage 4 chronic kidney disease, or unspecified chronic kidney disease: Secondary | ICD-10-CM | POA: Diagnosis not present

## 2018-04-07 DIAGNOSIS — N183 Chronic kidney disease, stage 3 (moderate): Secondary | ICD-10-CM | POA: Diagnosis not present

## 2018-04-10 DIAGNOSIS — M5136 Other intervertebral disc degeneration, lumbar region: Secondary | ICD-10-CM | POA: Diagnosis not present

## 2018-04-10 DIAGNOSIS — M5412 Radiculopathy, cervical region: Secondary | ICD-10-CM | POA: Diagnosis not present

## 2018-04-10 DIAGNOSIS — M5416 Radiculopathy, lumbar region: Secondary | ICD-10-CM | POA: Diagnosis not present

## 2018-04-18 ENCOUNTER — Encounter: Payer: Self-pay | Admitting: Cardiology

## 2018-04-18 ENCOUNTER — Ambulatory Visit: Payer: Medicare Other | Admitting: Cardiology

## 2018-04-18 VITALS — BP 120/76 | HR 51 | Ht 68.0 in | Wt 213.0 lb

## 2018-04-18 DIAGNOSIS — I10 Essential (primary) hypertension: Secondary | ICD-10-CM | POA: Diagnosis not present

## 2018-04-18 DIAGNOSIS — I480 Paroxysmal atrial fibrillation: Secondary | ICD-10-CM

## 2018-04-18 LAB — TSH: TSH: 1 u[IU]/mL (ref 0.450–4.500)

## 2018-04-18 NOTE — Patient Instructions (Signed)
Medication Instructions:  Your physician recommends that you continue on your current medications as directed. Please refer to the Current Medication list given to you today.   Labwork: Your physician recommends that you have lab work today: TSH   Procedures/Testing: None ordered   Follow-Up: Your physician wants you to follow-up in: 6 months with Dr. Cora Daniels will receive a reminder letter in the mail two months in advance. If you don't receive a letter, please call our office to schedule the follow-up appointment.  Any Additional Special Instructions Will Be Listed Below (If Applicable).     If you need a refill on your cardiac medications before your next appointment, please call your pharmacy.

## 2018-04-18 NOTE — Progress Notes (Signed)
Electrophysiology Office Note   Date:  04/18/2018   ID:  Patrick Roth, DOB 11-14-1946, MRN [id number]  PCP:  Brunetta Jeans, PA-C  Cardiologist:  [name] Primary Electrophysiologist:  Frona Yost Meredith Leeds, MD    Chief Complaint  Patient presents with  . Follow-up    Persistent Afib     History of Present Illness: Patrick Roth is a 72 y.o. male who presents today for electrophysiology evaluation.   Hx of HTN and multiple drug intolerances, HL, prior CVA. He was admitted in 7/17 with syncope felt to be 2/2 orthostatic hypotension. He was found to be in new onset AF with RVR. He has been asymptomatic. He was started on Metoprolol Tartrate 25 bid and Xarelto for anticoagulation. Is on amiodarone for rhythm control.  Today, denies symptoms of palpitations, chest pain, shortness of breath, orthopnea, PND, lower extremity edema, claudication, dizziness, presyncope, syncope, bleeding, or neurologic sequela. The patient is tolerating medications without difficulties.  Noted no further episodes of atrial fibrillation.  Past Medical History:  Diagnosis Date  . Colon polyps   . History of chicken pox   . Hyperlipidemia   . Hypertension   . Internal hemorrhoids   . Irregular heartbeat   . Sciatica   . Stroke [facility]) 2008   Past Surgical History:  Procedure Laterality Date  . CARDIOVERSION N/A 09/02/2017   Procedure: CARDIOVERSION;  Surgeon: [name], MD;  Location: [facility] ENDOSCOPY;  Service: Cardiovascular;  Laterality: N/A;  . COLONOSCOPY  2011  . great toe surgery    . PROSTATE BIOPSY       Current Outpatient Medications  Medication Sig Dispense Refill  . amiodarone (PACERONE) 200 MG tablet Take 1 tablet (200 mg total) by mouth daily. 90 tablet 2  . apixaban (ELIQUIS) 5 MG TABS tablet Take 1 tablet (5 mg total) by mouth 2 (two) times daily. 60 tablet 6  . chlorthalidone (HYGROTON) 25 MG tablet TAKE 1 TABLET BY MOUTH ONCE DAILY 90 tablet 0  . cloNIDine (CATAPRES -  DOSED IN MG/24 HR) 0.2 mg/24hr patch Place 1 patch onto the skin once a week.  0  . cyclobenzaprine (FLEXERIL) 10 MG tablet Take 1 tablet by mouth as needed for muscle spasms.    Marland Kitchen doxazosin (CARDURA) 1 MG tablet Take 1 mg by mouth daily.    Marland Kitchen gabapentin (NEURONTIN) 100 MG capsule TAKE 1 CAPSULE BY MOUTH IN THE MORNING, 1 CAPSULE IN THE AFTERNOON AND 3 CAPSULES IN THE EVENING (Patient taking differently: take 4 capsules at night) 150 capsule 3  . potassium chloride SA (K-DUR,KLOR-CON) 20 MEQ tablet Take 20 mEq by mouth 2 (two) times daily.     No current facility-administered medications for this visit.     Allergies:   Crestor [rosuvastatin calcium]; Spironolactone; Terazosin; Atenolol; Bystolic [nebivolol hcl]; Hydralazine; Tizanidine; Amlodipine; Clonidine; Hydrocodone-acetaminophen; Levofloxacin; Losartan; Maxzide [hydrochlorothiazide w-triamterene]; Olmesartan; Tape; and Tussionex pennkinetic er [hydrocod polst-cpm polst er]   Social History:  The patient  reports that he has never smoked. He has never used smokeless tobacco. He reports that he does not drink alcohol or use drugs.   Family History:  The patient's family history includes Healthy in his brother and sister; Heart disease in his brother and father; Hypertension (age of onset: 58) in his father; Lung cancer (age of onset: 66) in his mother; Lupus in his daughter; Other in his daughter.   ROS:  Please see the history of present illness.   Otherwise, review of systems is  positive for back pain.   All other systems are reviewed and negative.   PHYSICAL EXAM: VS:  BP 120/76   Pulse (!) 51   Ht 5\' 8"  (1.727 m)   Wt 213 lb (96.6 kg)   SpO2 97%   BMI 32.39 kg/m  , BMI Body mass index is 32.39 kg/m. GEN: Well nourished, well developed, in no acute distress  HEENT: normal  Neck: no JVD, carotid bruits, or masses Cardiac: RRR; no murmurs, rubs, or gallops,no edema  Respiratory:  clear to auscultation bilaterally, normal work of  breathing GI: soft, nontender, nondistended, + BS MS: no deformity or atrophy  Skin: warm and dry Neuro:  Strength and sensation are intact Psych: euthymic mood, full affect  EKG:  EKG is ordered today. Personal review of the ekg ordered shows sinus rhythm, first-degree AV block, right bundle branch block, left anterior fascicular block  Recent Labs: 04/23/2017: TSH 0.998 08/03/2017: Platelets 164.0 09/02/2017: Hemoglobin 17.3 11/21/2017: Magnesium 1.9 02/13/2018: ALT 19; BUN 15; Creatinine, Ser 1.41; Potassium 4.0; Sodium 138    Lipid Panel     Component Value Date/Time   CHOL 197 02/13/2018 1049   TRIG 50.0 02/13/2018 1049   HDL 50.60 02/13/2018 1049   CHOLHDL 4 02/13/2018 1049   VLDL 10.0 02/13/2018 1049   LDLCALC 137 (H) 02/13/2018 1049     Wt Readings from Last 3 Encounters:  04/18/18 213 lb (96.6 kg)  02/24/18 214 lb (97.1 kg)  02/21/18 214 lb (97.1 kg)      Other studies Reviewed: Additional studies/ records that were reviewed today include: TTE 02/28/18 Review of the above records today demonstrates:  - Left ventricle: The cavity size was normal. Systolic function was   normal. The estimated ejection fraction was in the range of 55%   to 60%. Wall motion was normal; there were no regional wall   motion abnormalities. There was a reduced contribution of atrial   contraction to ventricular filling, due to increased ventricular   diastolic pressure or atrial contractile dysfunction. The study   is not technically sufficient to allow evaluation of LV diastolic   function. (suspect left atrial mechanical failure, possible   stunning after recent episode of atrial fibrillation versus   chronic atrial dysfunction) - Left atrium: The atrium was mildly dilated. - Right ventricle: The cavity size was mildly dilated. Wall   thickness was normal. - Right atrium: The atrium was mildly dilated. - Pulmonary arteries: Systolic pressure was moderately increased.   PA peak  pressure: 60 mm Hg (S).  Myoview 02/24/18  Nuclear stress EF: 58%.  Normal perfusion. No ischemia  This is a low risk study.  ASSESSMENT AND PLAN:  1.  Paroxysmal atrial fibrillation/atrial flutter: In sinus rhythm today with APCs.  He is currently on amiodarone and tolerating it well.  We Mary-Anne Polizzi check a TSH today.  No further changes.  This patients CHA2DS2-VASc Score and unadjusted Ischemic Stroke Rate (% per year) is equal to 4.8 % stroke rate/year from a score of 4  Above score calculated as 1 point each if present [CHF, HTN, DM, Vascular=MI/PAD/Aortic Plaque, Age if 65-74, or Male] Above score calculated as 2 points each if present [Age > 75, or Stroke/TIA/TE]  2. Hypertension: Well-controlled.  No changes.  Current medicines are reviewed at length with the patient today.   The patient does not have concerns regarding his medicines.  The following changes were made today: None  Labs/ tests ordered today include:  Orders Placed This  Encounter  Procedures  . TSH  . EKG 12-Lead     Disposition:   FU with Kendre Sires 6 months  Signed, Lydia Meng Meredith Leeds, MD  04/18/2018 10:07 AM     [facility] [address] [place] [address] [phone] (office) [phone] (fax)

## 2018-05-17 DIAGNOSIS — H5319 Other subjective visual disturbances: Secondary | ICD-10-CM | POA: Diagnosis not present

## 2018-05-17 DIAGNOSIS — H16223 Keratoconjunctivitis sicca, not specified as Sjogren's, bilateral: Secondary | ICD-10-CM | POA: Diagnosis not present

## 2018-05-17 DIAGNOSIS — H2513 Age-related nuclear cataract, bilateral: Secondary | ICD-10-CM | POA: Diagnosis not present

## 2018-05-30 DIAGNOSIS — M5416 Radiculopathy, lumbar region: Secondary | ICD-10-CM | POA: Diagnosis not present

## 2018-05-30 DIAGNOSIS — M5412 Radiculopathy, cervical region: Secondary | ICD-10-CM | POA: Diagnosis not present

## 2018-05-30 DIAGNOSIS — M5136 Other intervertebral disc degeneration, lumbar region: Secondary | ICD-10-CM | POA: Diagnosis not present

## 2018-06-04 ENCOUNTER — Other Ambulatory Visit: Payer: Self-pay | Admitting: Physician Assistant

## 2018-07-02 ENCOUNTER — Other Ambulatory Visit: Payer: Self-pay | Admitting: Cardiology

## 2018-07-23 ENCOUNTER — Other Ambulatory Visit: Payer: Self-pay | Admitting: Physician Assistant

## 2018-07-23 DIAGNOSIS — G629 Polyneuropathy, unspecified: Secondary | ICD-10-CM

## 2018-07-25 ENCOUNTER — Telehealth: Payer: Self-pay | Admitting: Emergency Medicine

## 2018-07-25 NOTE — Telephone Encounter (Signed)
Copied from [place] [id number]. Topic: Appointment Scheduling - Scheduling Inquiry for Clinic >> Jul 25, 2018 10:32 AM [name] wrote: Reason for CRM:   Pt calling asking when his next Annual Wellness Visit should be scheduled.   Pt can be reached at [phone]

## 2018-07-26 NOTE — Telephone Encounter (Signed)
Spoke with [facility] coach and has scheduled patient for Friday 08/11/18 for MWV/CPE Patient is agreeable.

## 2018-07-26 NOTE — Telephone Encounter (Signed)
Last was 08/03/17. Ok to schedule 08/04/18 or later -- Will need MWV with Roderic Ovens, RN and CPE with me right after.

## 2018-08-03 DIAGNOSIS — M5136 Other intervertebral disc degeneration, lumbar region: Secondary | ICD-10-CM | POA: Diagnosis not present

## 2018-08-03 DIAGNOSIS — I10 Essential (primary) hypertension: Secondary | ICD-10-CM | POA: Diagnosis not present

## 2018-08-03 DIAGNOSIS — Z8673 Personal history of transient ischemic attack (TIA), and cerebral infarction without residual deficits: Secondary | ICD-10-CM | POA: Diagnosis not present

## 2018-08-03 DIAGNOSIS — M5416 Radiculopathy, lumbar region: Secondary | ICD-10-CM | POA: Diagnosis not present

## 2018-08-07 DIAGNOSIS — N183 Chronic kidney disease, stage 3 (moderate): Secondary | ICD-10-CM | POA: Diagnosis not present

## 2018-08-07 DIAGNOSIS — I129 Hypertensive chronic kidney disease with stage 1 through stage 4 chronic kidney disease, or unspecified chronic kidney disease: Secondary | ICD-10-CM | POA: Diagnosis not present

## 2018-08-07 DIAGNOSIS — I4891 Unspecified atrial fibrillation: Secondary | ICD-10-CM | POA: Diagnosis not present

## 2018-08-07 DIAGNOSIS — M545 Low back pain: Secondary | ICD-10-CM | POA: Diagnosis not present

## 2018-08-10 NOTE — Progress Notes (Signed)
Subjective:   Patrick Roth is a 72 y.o. male who presents for Medicare Annual/Subsequent preventive examination.  Review of Systems:  No ROS.  Medicare Wellness Visit. Additional risk factors are reflected in the social history.  Cardiac Risk Factors include: advanced age (>46men, >41 women);dyslipidemia;male gender;hypertension;obesity (BMI >30kg/m2);family history of premature cardiovascular disease   Sleep patterns: Sleeps 6-7 hours. Up to void x 2-3.  Home Safety/Smoke Alarms: Feels safe in home. Smoke alarms in place.  Living environment; residence and Firearm Safety: Lives with wife and daughter in 69 story home with basement.  Seat Belt Safety/Bike Helmet: Wears seat belt.   Male:   CCS-Colonoscopy 10/26/2010, normal. Recall 10 years.     PSA- No results found for: PSA      Objective:    Vitals: BP (!) 162/100 (BP Location: Left Arm, Cuff Size: Normal)   Pulse 88   Temp 98.2 F (36.8 C) (Temporal)   Resp 18   Ht 5\' 8"  (1.727 m)   Wt 207 lb 8 oz (94.1 kg)   SpO2 98%   BMI 31.55 kg/m   Body mass index is 31.55 kg/m.  Advanced Directives 08/11/2018 09/02/2017 08/03/2017 04/22/2017 06/20/2016 04/08/2016  Does Patient Have a Medical Advance Directive? No No No No No No  Would patient like information on creating a medical advance directive? No - Patient declined No - Patient declined Yes (MAU/Ambulatory/Procedural Areas - Information given) - No - patient declined information -    Tobacco Social History   Tobacco Use  Smoking Status Never Smoker  Smokeless Tobacco Never Used     Counseling given: Not Answered   Past Medical History:  Diagnosis Date  . Colon polyps   . History of chicken pox   . Hyperlipidemia   . Hypertension   . Internal hemorrhoids   . Irregular heartbeat   . Sciatica   . Stroke [facility]) 2008   Past Surgical History:  Procedure Laterality Date  . CARDIOVERSION N/A 09/02/2017   Procedure: CARDIOVERSION;  Surgeon: [name], MD;   Location: [facility] ENDOSCOPY;  Service: Cardiovascular;  Laterality: N/A;  . COLONOSCOPY  2011  . great toe surgery    . PROSTATE BIOPSY     Family History  Problem Relation Age of Onset  . Hypertension Father 59       Deceased  . Heart disease Father   . Lung cancer Mother 22       Deceased  . Healthy Sister        x3  . Healthy Brother        x4  . Heart disease Brother        #5  . Other Daughter        Alpha Thalassemia  . Lupus Daughter        #2-deceased  . Diabetes Neg Hx   . Heart attack Neg Hx   . Hyperlipidemia Neg Hx   . Sudden death Neg Hx    Social History   Socioeconomic History  . Marital status: Married    Spouse name: Not on file  . Number of children: 1  . Years of education: 39  . Highest education level: Not on file  Occupational History  . Occupation: Retired  Scientific laboratory technician  . Financial resource strain: Not on file  . Food insecurity:    Worry: Not on file    Inability: Not on file  . Transportation needs:    Medical: Not on file  Non-medical: Not on file  Tobacco Use  . Smoking status: Never Smoker  . Smokeless tobacco: Never Used  Substance and Sexual Activity  . Alcohol use: No  . Drug use: No  . Sexual activity: Not on file  Lifestyle  . Physical activity:    Days per week: Not on file    Minutes per session: Not on file  . Stress: Not on file  Relationships  . Social connections:    Talks on phone: Not on file    Gets together: Not on file    Attends religious service: Not on file    Active member of club or organization: Not on file    Attends meetings of clubs or organizations: Not on file    Relationship status: Not on file  Other Topics Concern  . Not on file  Social History Narrative   Fun: Play golf and build furniture.     Outpatient Encounter Medications as of 08/11/2018  Medication Sig  . amiodarone (PACERONE) 200 MG tablet Take 1 tablet (200 mg total) by mouth daily.  Marland Kitchen apixaban (ELIQUIS) 5 MG TABS tablet Take 1  tablet (5 mg total) by mouth 2 (two) times daily.  . chlorthalidone (HYGROTON) 25 MG tablet TAKE 1 TABLET BY MOUTH ONCE DAILY  . cloNIDine (CATAPRES - DOSED IN MG/24 HR) 0.2 mg/24hr patch Place 1 patch onto the skin once a week.  . gabapentin (NEURONTIN) 100 MG capsule TAKE 1 CAPSULE BY MOUTH IN THE MORNING, 1 CAPSULE IN THE AFTERNOON AND 3 CAPSULES IN THE EVENING  . potassium chloride SA (K-DUR,KLOR-CON) 20 MEQ tablet Take 20 mEq by mouth 2 (two) times daily.  . psyllium (REGULOID) 0.52 g capsule Take by mouth.  . cyclobenzaprine (FLEXERIL) 10 MG tablet Take 1 tablet by mouth as needed for muscle spasms.  Marland Kitchen doxazosin (CARDURA) 1 MG tablet Take 1 mg by mouth daily.  . [DISCONTINUED] amiodarone (PACERONE) 200 MG tablet TAKE 1 TABLET BY MOUTH DAILY   No facility-administered encounter medications on file as of 08/11/2018.     Activities of Daily Living In your present state of health, do you have any difficulty performing the following activities: 08/11/2018  Hearing? N  Vision? N  Difficulty concentrating or making decisions? N  Walking or climbing stairs? N  Dressing or bathing? N  Doing errands, shopping? N  Preparing Food and eating ? N  Using the Toilet? N  In the past six months, have you accidently leaked urine? N  Do you have problems with loss of bowel control? N  Managing your Medications? N  Managing your Finances? N  Housekeeping or managing your Housekeeping? N  Some recent data might be hidden    Patient Care Team: Delorse Limber as PCP - General (Physician Assistant) Constance Haw, MD as PCP - Cardiology (Cardiology) Irene Shipper, MD as Consulting Physician (Gastroenterology) [name], Wonda Cheng, MD as Consulting Physician (Cardiology) Christy Sartorius, MD as Referring Physician (Urology) Inocencio Homes, DPM as Consulting Physician (Podiatry) Myrle Sheng, MD as Referring Physician (Neurosurgery) Begovich, Geradine Girt, DO (Sports Medicine)     Assessment:   This is a routine wellness examination for Patrick Roth.  Exercise Activities and Dietary recommendations Current Exercise Habits: Home exercise routine, Type of exercise: stretching, Exercise limited by: orthopedic condition(s)   Diet (meal preparation, eat out, water intake, caffeinated beverages, dairy products, fruits and vegetables): Drinks water  Breakfast: cereal; waffle; grits; egg/bacon; coffee Lunch: skips Dinner: lean protein and vegetables.  Goals      Patient Stated   . patient (pt-stated)     Maintain current health by staying active.        Fall Risk Fall Risk  08/11/2018 08/03/2017 11/30/2016 01/29/2016  Falls in the past year? No No No No    Depression Screen PHQ 2/9 Scores 08/11/2018 08/03/2017 11/30/2016 11/30/2016  PHQ - 2 Score 0 0 0 0  PHQ- 9 Score - - - 0    Cognitive Function MMSE - Mini Mental State Exam 08/11/2018 08/03/2017  Orientation to time 5 5  Orientation to Place 5 5  Registration 3 3  Attention/ Calculation 5 2  Recall 1 3  Language- name 2 objects 2 2  Language- repeat 1 1  Language- follow 3 step command 3 3  Language- read & follow direction 1 1  Write a sentence 1 1  Copy design 1 1  Total score 28 27        Immunization History  Administered Date(s) Administered  . Influenza,inj,Quad PF,6+ Mos 07/23/2015  . Tdap 07/05/2013    Screening Tests Health Maintenance  Topic Date Due  . Hepatitis C Screening  02/14/2019 (Originally Aug 12, 1946)  . PNA vac Low Risk Adult (1 of 2 - PCV13) 02/14/2019 (Originally 02/03/2010)  . INFLUENZA VACCINE  06/23/2019 (Originally 06/22/2018)  . COLONOSCOPY  10/26/2020  . DTaP/Tdap/Td (2 - Td) 07/06/2023  . TETANUS/TDAP  07/06/2023        Plan:    Bring a copy of your living will and/or healthcare power of attorney to your next office visit.  Continue doing brain stimulating activities (puzzles, reading, adult coloring books, staying active) to keep memory sharp.   I have personally  reviewed and noted the following in the patient's chart:   . Medical and social history . Use of alcohol, tobacco or illicit drugs  . Current medications and supplements . Functional ability and status . Nutritional status . Physical activity . Advanced directives . List of other physicians . Hospitalizations, surgeries, and ER visits in previous 12 months . Vitals . Screenings to include cognitive, depression, and falls . Referrals and appointments  In addition, I have reviewed and discussed with patient certain preventive protocols, quality metrics, and best practice recommendations. A written personalized care plan for preventive services as well as general preventive health recommendations were provided to patient.     Gerilyn Nestle, RN  08/11/2018

## 2018-08-11 ENCOUNTER — Ambulatory Visit (INDEPENDENT_AMBULATORY_CARE_PROVIDER_SITE_OTHER): Payer: Medicare Other

## 2018-08-11 ENCOUNTER — Telehealth: Payer: Self-pay | Admitting: *Deleted

## 2018-08-11 ENCOUNTER — Encounter: Payer: Self-pay | Admitting: Physician Assistant

## 2018-08-11 ENCOUNTER — Telehealth: Payer: Self-pay | Admitting: Physician Assistant

## 2018-08-11 ENCOUNTER — Other Ambulatory Visit: Payer: Self-pay

## 2018-08-11 ENCOUNTER — Ambulatory Visit (INDEPENDENT_AMBULATORY_CARE_PROVIDER_SITE_OTHER): Payer: Medicare Other | Admitting: Physician Assistant

## 2018-08-11 VITALS — BP 162/100 | HR 88 | Temp 98.2°F | Resp 18 | Ht 68.0 in | Wt 207.5 lb

## 2018-08-11 VITALS — BP 132/92 | HR 88 | Temp 98.2°F | Resp 14 | Ht 68.0 in | Wt 207.0 lb

## 2018-08-11 DIAGNOSIS — R160 Hepatomegaly, not elsewhere classified: Secondary | ICD-10-CM

## 2018-08-11 DIAGNOSIS — Z125 Encounter for screening for malignant neoplasm of prostate: Secondary | ICD-10-CM

## 2018-08-11 DIAGNOSIS — D582 Other hemoglobinopathies: Secondary | ICD-10-CM

## 2018-08-11 DIAGNOSIS — I10 Essential (primary) hypertension: Secondary | ICD-10-CM

## 2018-08-11 DIAGNOSIS — E669 Obesity, unspecified: Secondary | ICD-10-CM

## 2018-08-11 DIAGNOSIS — Z0000 Encounter for general adult medical examination without abnormal findings: Secondary | ICD-10-CM | POA: Diagnosis not present

## 2018-08-11 DIAGNOSIS — E785 Hyperlipidemia, unspecified: Secondary | ICD-10-CM

## 2018-08-11 LAB — COMPREHENSIVE METABOLIC PANEL
ALBUMIN: 4.1 g/dL (ref 3.5–5.2)
ALT: 33 U/L (ref 0–53)
AST: 33 U/L (ref 0–37)
Alkaline Phosphatase: 84 U/L (ref 39–117)
BUN: 15 mg/dL (ref 6–23)
CHLORIDE: 98 meq/L (ref 96–112)
CO2: 34 meq/L — AB (ref 19–32)
Calcium: 9.6 mg/dL (ref 8.4–10.5)
Creatinine, Ser: 1.55 mg/dL — ABNORMAL HIGH (ref 0.40–1.50)
GFR: 56.73 mL/min — AB (ref >60.00)
Glucose, Bld: 85 mg/dL (ref 70–99)
Potassium: 3.7 mEq/L (ref 3.5–5.1)
Sodium: 139 mEq/L (ref 135–145)
TOTAL PROTEIN: 7.4 g/dL (ref 6.0–8.3)
Total Bilirubin: 1.3 mg/dL — ABNORMAL HIGH (ref 0.2–1.2)

## 2018-08-11 LAB — CBC WITH DIFFERENTIAL/PLATELET
BASOS PCT: 0.6 % (ref 0.0–3.0)
Basophils Absolute: 0 10*3/uL (ref 0.0–0.1)
EOS PCT: 2.8 % (ref 0.0–5.0)
Eosinophils Absolute: 0.1 10*3/uL (ref 0.0–0.7)
HEMATOCRIT: 54.8 % — AB (ref 39.0–52.0)
Lymphocytes Relative: 28.5 % (ref 12.0–46.0)
Lymphs Abs: 0.9 10*3/uL (ref 0.7–4.0)
MCHC: 33 g/dL (ref 30.0–36.0)
MCV: 84.2 fl (ref 78.0–100.0)
MONOS PCT: 27.3 % — AB (ref 3.0–12.0)
Monocytes Absolute: 0.8 10*3/uL (ref 0.1–1.0)
Neutro Abs: 1.2 10*3/uL — ABNORMAL LOW (ref 1.4–7.7)
Neutrophils Relative %: 40.8 % — ABNORMAL LOW (ref 43.0–77.0)
Platelets: 176 10*3/uL (ref 150.0–400.0)
RBC: 6.51 Mil/uL — AB (ref 4.22–5.81)
RDW: 14.8 % (ref 11.5–15.5)
WBC: 3 10*3/uL — AB (ref 4.0–10.5)

## 2018-08-11 LAB — LIPID PANEL
CHOL/HDL RATIO: 7
Cholesterol: 248 mg/dL — ABNORMAL HIGH (ref 0–200)
HDL: 37.9 mg/dL — AB (ref >39.00)
LDL CALC: 181 mg/dL — AB (ref 0–99)
NonHDL: 209.64
TRIGLYCERIDES: 143 mg/dL (ref 0.0–149.0)
VLDL: 28.6 mg/dL (ref 0.0–40.0)

## 2018-08-11 LAB — TSH: TSH: 1.29 u[IU]/mL (ref 0.35–4.50)

## 2018-08-11 LAB — HEMOGLOBIN A1C: Hgb A1c MFr Bld: 6 % (ref 4.6–6.5)

## 2018-08-11 LAB — PSA: PSA: 8.73 ng/mL — ABNORMAL HIGH (ref 0.10–4.00)

## 2018-08-11 NOTE — Progress Notes (Signed)
Patient presents to clinic today for annual exam.  Patient is fasting for labs. Patient just finished [place] with RN. Declines flu shot and pneumonia vaccination AMA. Declines Hep C screening. Colonoscopy is up-to-date. Recall in 2021.  Is currently followed by Cardiology due to history of atrial fibrillation. Is currently on regimen of Amidarone and Eliquis. Patient denies chest pain, palpitations, lightheadedness, dizziness, vision changes or frequent headaches. Is taking his chlorthalidone and potassium supplement for hypertension.   BP Readings from Last 3 Encounters:  08/11/18 (!) 132/92  08/11/18 (!) 162/100  04/18/18 120/76   Acute Concerns: Patient denies acute concerns at today's visit.  Past Medical History:  Diagnosis Date  . Colon polyps   . History of chicken pox   . Hyperlipidemia   . Hypertension   . Internal hemorrhoids   . Irregular heartbeat   . Sciatica   . Stroke [facility]) 2008    Past Surgical History:  Procedure Laterality Date  . CARDIOVERSION N/A 09/02/2017   Procedure: CARDIOVERSION;  Surgeon: [name], MD;  Location: [facility] ENDOSCOPY;  Service: Cardiovascular;  Laterality: N/A;  . COLONOSCOPY  2011  . great toe surgery    . PROSTATE BIOPSY      Current Outpatient Medications on File Prior to Visit  Medication Sig Dispense Refill  . amiodarone (PACERONE) 200 MG tablet Take 1 tablet (200 mg total) by mouth daily. 90 tablet 2  . apixaban (ELIQUIS) 5 MG TABS tablet Take 1 tablet (5 mg total) by mouth 2 (two) times daily. 60 tablet 6  . chlorthalidone (HYGROTON) 25 MG tablet TAKE 1 TABLET BY MOUTH ONCE DAILY 90 tablet 0  . cloNIDine (CATAPRES - DOSED IN MG/24 HR) 0.2 mg/24hr patch Place 1 patch onto the skin once a week.  0  . cyclobenzaprine (FLEXERIL) 10 MG tablet Take 1 tablet by mouth as needed for muscle spasms.    Marland Kitchen doxazosin (CARDURA) 1 MG tablet Take 1 mg by mouth daily.    Marland Kitchen gabapentin (NEURONTIN) 100 MG capsule TAKE 1 CAPSULE BY MOUTH IN  THE MORNING, 1 CAPSULE IN THE AFTERNOON AND 3 CAPSULES IN THE EVENING 150 capsule 3  . potassium chloride SA (K-DUR,KLOR-CON) 20 MEQ tablet Take 20 mEq by mouth 2 (two) times daily.    . psyllium (REGULOID) 0.52 g capsule Take by mouth.     No current facility-administered medications on file prior to visit.     Allergies  Allergen Reactions  . Crestor [Rosuvastatin Calcium] Shortness Of Breath    Muscle cramps  . Spironolactone Shortness Of Breath  . Terazosin Shortness Of Breath, Palpitations and Other (See Comments)    Nerve pain  . Atenolol Other (See Comments)    Drowsiness and "flu sxs"  . Bystolic [Nebivolol Hcl] Other (See Comments)    GI issues  . Hydralazine Other (See Comments)    Joint swelling  . Tizanidine Other (See Comments)    Syncope, Elevated BP/pulse  . Amlodipine Other (See Comments)  . Clonidine Other (See Comments)  . Hydrocodone-Acetaminophen Other (See Comments)  . Levofloxacin Other (See Comments)  . Losartan Other (See Comments)    Did not work.  Pt states he couldn't take b/c of SE, but can't remember what SE were.  . Maxzide [Hydrochlorothiazide W-Triamterene] Other (See Comments)    Does not tolerate potassium-sparing diuretics  . Olmesartan Other (See Comments)  . Tape Other (See Comments)    Other reaction(s): Unknown  . Tussionex Pennkinetic Er [Hydrocod Polst-Cpm Polst Er]  Other (See Comments)    Other reaction(s): Unknown    Family History  Problem Relation Age of Onset  . Hypertension Father 84       Deceased  . Heart disease Father   . Lung cancer Mother 45       Deceased  . Healthy Sister        x3  . Healthy Brother        x4  . Heart disease Brother        #5  . Other Daughter        Alpha Thalassemia  . Lupus Daughter        #2-deceased  . Diabetes Neg Hx   . Heart attack Neg Hx   . Hyperlipidemia Neg Hx   . Sudden death Neg Hx     Social History   Socioeconomic History  . Marital status: Married    Spouse name:  Not on file  . Number of children: 1  . Years of education: 3  . Highest education level: Not on file  Occupational History  . Occupation: Retired  Scientific laboratory technician  . Financial resource strain: Not on file  . Food insecurity:    Worry: Not on file    Inability: Not on file  . Transportation needs:    Medical: Not on file    Non-medical: Not on file  Tobacco Use  . Smoking status: Never Smoker  . Smokeless tobacco: Never Used  Substance and Sexual Activity  . Alcohol use: No  . Drug use: No  . Sexual activity: Not on file  Lifestyle  . Physical activity:    Days per week: Not on file    Minutes per session: Not on file  . Stress: Not on file  Relationships  . Social connections:    Talks on phone: Not on file    Gets together: Not on file    Attends religious service: Not on file    Active member of club or organization: Not on file    Attends meetings of clubs or organizations: Not on file    Relationship status: Not on file  . Intimate partner violence:    Fear of current or ex partner: Not on file    Emotionally abused: Not on file    Physically abused: Not on file    Forced sexual activity: Not on file  Other Topics Concern  . Not on file  Social History Narrative   Fun: Play golf and build furniture.    Review of Systems  Constitutional: Negative for fever and weight loss.  HENT: Negative for ear discharge, ear pain, hearing loss and tinnitus.   Eyes: Negative for blurred vision, double vision, photophobia and pain.  Respiratory: Negative for cough and shortness of breath.   Cardiovascular: Negative for chest pain and palpitations.  Gastrointestinal: Negative for abdominal pain, blood in stool, constipation, diarrhea, heartburn, melena, nausea and vomiting.  Genitourinary: Negative for dysuria, flank pain, frequency, hematuria and urgency.  Musculoskeletal: Negative for falls.  Neurological: Negative for dizziness, loss of consciousness and headaches.    Endo/Heme/Allergies: Negative for environmental allergies.  Psychiatric/Behavioral: Negative for depression, hallucinations, substance abuse and suicidal ideas. The patient is not nervous/anxious and does not have insomnia.     BP (!) 164/100   Pulse 88   Temp 98.2 F (36.8 C) (Oral)   Resp 14   Ht 5\' 8"  (1.727 m)   Wt 207 lb (93.9 kg)   SpO2 98%  BMI 31.47 kg/m   Physical Exam  Constitutional: He is oriented to person, place, and time. He appears well-developed and well-nourished. No distress.  HENT:  Head: Normocephalic and atraumatic.  Right Ear: Tympanic membrane, external ear and ear canal normal.  Left Ear: Tympanic membrane, external ear and ear canal normal.  Nose: Nose normal.  Mouth/Throat: Oropharynx is clear and moist and mucous membranes are normal. No posterior oropharyngeal edema or posterior oropharyngeal erythema.  Eyes: Pupils are equal, round, and reactive to light. Conjunctivae are normal.  Neck: Neck supple. No thyromegaly present.  Cardiovascular: Normal rate, regular rhythm, normal heart sounds and intact distal pulses.  Pulmonary/Chest: Effort normal and breath sounds normal. No respiratory distress. He has no wheezes. He has no rales. He exhibits no tenderness.  Abdominal: Soft. Bowel sounds are normal. He exhibits no distension. There is hepatomegaly. There is no tenderness. There is no rebound and no guarding.  Lymphadenopathy:    He has no cervical adenopathy.  Neurological: He is alert and oriented to person, place, and time. No cranial nerve deficit.  Skin: Skin is warm and dry. No rash noted. He is not diaphoretic.  Psychiatric: He has a normal mood and affect.  Vitals reviewed.  Assessment/Plan: 1. Essential hypertension BP improved on recheck. He is to be consistent with medications. Follow-up with Cardiology as scheduled.  - CBC with Differential/Platelet - Comprehensive metabolic panel  2. Hyperlipidemia, unspecified hyperlipidemia  type Will check fasting lipids today. Continue current regimen for now. - Lipid panel  3. Visit for preventive health examination Depression screen negative. Health Maintenance reviewed. Preventive schedule discussed and handout given in AVS. Will obtain fasting labs today.  - CBC with Differential/Platelet - Comprehensive metabolic panel - Lipid panel - Hemoglobin A1c - PSA - TSH  4. Hepatomegaly Asymptomatic. Will check CMP today and [place] RUQ to further assess. May need CT/MRI. - US Abdomen Limited RUQ; Future   Leeanne Rio, PA-C

## 2018-08-11 NOTE — Patient Instructions (Addendum)
Bring a copy of your living will and/or healthcare power of attorney to your next office visit.  Continue doing brain stimulating activities (puzzles, reading, adult coloring books, staying active) to keep memory sharp.    Health Maintenance, Male A healthy lifestyle and preventive care is important for your health and wellness. Ask your health care provider about what schedule of regular examinations is right for you. What should I know about weight and diet? Eat a Healthy Diet  Eat plenty of vegetables, fruits, whole grains, low-fat dairy products, and lean protein.  Do not eat a lot of foods high in solid fats, added sugars, or salt.  Maintain a Healthy Weight Regular exercise can help you achieve or maintain a healthy weight. You should:  Do at least 150 minutes of exercise each week. The exercise should increase your heart rate and make you sweat (moderate-intensity exercise).  Do strength-training exercises at least twice a week.  Watch Your Levels of Cholesterol and Blood Lipids  Have your blood tested for lipids and cholesterol every 5 years starting at 73 years of age. If you are at high risk for heart disease, you should start having your blood tested when you are 73 years old. You may need to have your cholesterol levels checked more often if: ? Your lipid or cholesterol levels are high. ? You are older than 73 years of age. ? You are at high risk for heart disease.  What should I know about cancer screening? Many types of cancers can be detected early and may often be prevented. Lung Cancer  You should be screened every year for lung cancer if: ? You are a current smoker who has smoked for at least 30 years. ? You are a former smoker who has quit within the past 15 years.  Talk to your health care provider about your screening options, when you should start screening, and how often you should be screened.  Colorectal Cancer  Routine colorectal cancer screening  usually begins at 73 years of age and should be repeated every 5-10 years until you are 73 years old. You may need to be screened more often if early forms of precancerous polyps or small growths are found. Your health care provider may recommend screening at an earlier age if you have risk factors for colon cancer.  Your health care provider may recommend using home test kits to check for hidden blood in the stool.  A small camera at the end of a tube can be used to examine your colon (sigmoidoscopy or colonoscopy). This checks for the earliest forms of colorectal cancer.  Prostate and Testicular Cancer  Depending on your age and overall health, your health care provider may do certain tests to screen for prostate and testicular cancer.  Talk to your health care provider about any symptoms or concerns you have about testicular or prostate cancer.  Skin Cancer  Check your skin from head to toe regularly.  Tell your health care provider about any new moles or changes in moles, especially if: ? There is a change in a mole's size, shape, or color. ? You have a mole that is larger than a pencil eraser.  Always use sunscreen. Apply sunscreen liberally and repeat throughout the day.  Protect yourself by wearing long sleeves, pants, a wide-brimmed hat, and sunglasses when outside.  What should I know about heart disease, diabetes, and high blood pressure?  If you are 18-39 years of age, have your blood pressure checked every   3-5 years. If you are 40 years of age or older, have your blood pressure checked every year. You should have your blood pressure measured twice-once when you are at a hospital or clinic, and once when you are not at a hospital or clinic. Record the average of the two measurements. To check your blood pressure when you are not at a hospital or clinic, you can use: ? An automated blood pressure machine at a pharmacy. ? A home blood pressure monitor.  Talk to your health care  provider about your target blood pressure.  If you are between 45-79 years old, ask your health care provider if you should take aspirin to prevent heart disease.  Have regular diabetes screenings by checking your fasting blood sugar level. ? If you are at a normal weight and have a low risk for diabetes, have this test once every three years after the age of 45. ? If you are overweight and have a high risk for diabetes, consider being tested at a younger age or more often.  A one-time screening for abdominal aortic aneurysm (AAA) by ultrasound is recommended for men aged 65-75 years who are current or former smokers. What should I know about preventing infection? Hepatitis B If you have a higher risk for hepatitis B, you should be screened for this virus. Talk with your health care provider to find out if you are at risk for hepatitis B infection. Hepatitis C Blood testing is recommended for:  Everyone born from 1945 through 1965.  Anyone with known risk factors for hepatitis C.  Sexually Transmitted Diseases (STDs)  You should be screened each year for STDs including gonorrhea and chlamydia if: ? You are sexually active and are younger than 73 years of age. ? You are older than 73 years of age and your health care provider tells you that you are at risk for this type of infection. ? Your sexual activity has changed since you were last screened and you are at an increased risk for chlamydia or gonorrhea. Ask your health care provider if you are at risk.  Talk with your health care provider about whether you are at high risk of being infected with HIV. Your health care provider may recommend a prescription medicine to help prevent HIV infection.  What else can I do?  Schedule regular health, dental, and eye exams.  Stay current with your vaccines (immunizations).  Do not use any tobacco products, such as cigarettes, chewing tobacco, and e-cigarettes. If you need help quitting, ask  your health care provider.  Limit alcohol intake to no more than 2 drinks per day. One drink equals 12 ounces of beer, 5 ounces of wine, or 1 ounces of hard liquor.  Do not use street drugs.  Do not share needles.  Ask your health care provider for help if you need support or information about quitting drugs.  Tell your health care provider if you often feel depressed.  Tell your health care provider if you have ever been abused or do not feel safe at home. This information is not intended to replace advice given to you by your health care provider. Make sure you discuss any questions you have with your health care provider. Document Released: 05/06/2008 Document Revised: 07/07/2016 Document Reviewed: 08/12/2015 Elsevier Interactive Patient Education  2018 [employer]  

## 2018-08-11 NOTE — Telephone Encounter (Signed)
This encounter was created in error - please disregard.

## 2018-08-11 NOTE — Addendum Note (Signed)
Addended by: Addison Naegeli on: 08/11/2018 03:30 PM   Modules accepted: Level of Service, SmartSet

## 2018-08-11 NOTE — Telephone Encounter (Signed)
Charted in error; see previous telephone encounter dated 08/11/18.

## 2018-08-11 NOTE — Telephone Encounter (Signed)
Pt called to get lab results per Elyn Aquas, [facility]; he verbalizes understanding and will hydrate over the weekend; he also schedules lab appointment 08/14/18 at 0815 for repeat CBC; will route to office for notification of this upcoming appointment.

## 2018-08-11 NOTE — Addendum Note (Signed)
Addended by: Katina Dung on: 08/11/2018 03:35 PM   Modules accepted: Orders

## 2018-08-11 NOTE — Patient Instructions (Signed)
-Please go to the lab for blood work.  -Our office will call you with your results unless you have chosen to receive results via MyChart. -If your blood work is normal we will follow-up each year for physicals and as scheduled for chronic medical problems. -If anything is abnormal we will treat accordingly and get you in for a follow-up.  -Continue medications as directed, getting back on BP medications daily. Follow-up in 2 weeks for reassessment of BP.  I am sending you for an Ultrasound of your liver as it is enlarged on exam today.   Preventive Care 73 Years and Older, Male Preventive care refers to lifestyle choices and visits with your health care provider that can promote health and wellness. What does preventive care include?  A yearly physical exam. This is also called an annual well check.  Dental exams once or twice a year.  Routine eye exams. Ask your health care provider how often you should have your eyes checked.  Personal lifestyle choices, including: ? Daily care of your teeth and gums. ? Regular physical activity. ? Eating a healthy diet. ? Avoiding tobacco and drug use. ? Limiting alcohol use. ? Practicing safe sex. ? Taking low doses of aspirin every day. ? Taking vitamin and mineral supplements as recommended by your health care provider. What happens during an annual well check? The services and screenings done by your health care provider during your annual well check will depend on your age, overall health, lifestyle risk factors, and family history of disease. Counseling Your health care provider may ask you questions about your:  Alcohol use.  Tobacco use.  Drug use.  Emotional well-being.  Home and relationship well-being.  Sexual activity.  Eating habits.  History of falls.  Memory and ability to understand (cognition).  Work and work Statistician.  Screening You may have the following tests or measurements:  Height, weight, and  BMI.  Blood pressure.  Lipid and cholesterol levels. These may be checked every 5 years, or more frequently if you are over 76 years old.  Skin check.  Lung cancer screening. You may have this screening every year starting at age 44 if you have a 30-pack-year history of smoking and currently smoke or have quit within the past 15 years.  Fecal occult blood test (FOBT) of the stool. You may have this test every year starting at age 61.  Flexible sigmoidoscopy or colonoscopy. You may have a sigmoidoscopy every 5 years or a colonoscopy every 10 years starting at age 56.  Prostate cancer screening. Recommendations will vary depending on your family history and other risks.  Hepatitis C blood test.  Hepatitis B blood test.  Sexually transmitted disease (STD) testing.  Diabetes screening. This is done by checking your blood sugar (glucose) after you have not eaten for a while (fasting). You may have this done every 1-3 years.  Abdominal aortic aneurysm (AAA) screening. You may need this if you are a current or former smoker.  Osteoporosis. You may be screened starting at age 61 if you are at high risk.  Talk with your health care provider about your test results, treatment options, and if necessary, the need for more tests. Vaccines Your health care provider may recommend certain vaccines, such as:  Influenza vaccine. This is recommended every year.  Tetanus, diphtheria, and acellular pertussis (Tdap, Td) vaccine. You may need a Td booster every 10 years.  Varicella vaccine. You may need this if you have not been vaccinated.  Zoster vaccine. You may need this after age 40.  Measles, mumps, and rubella (MMR) vaccine. You may need at least one dose of MMR if you were born in 1957 or later. You may also need a second dose.  Pneumococcal 13-valent conjugate (PCV13) vaccine. One dose is recommended after age 17.  Pneumococcal polysaccharide (PPSV23) vaccine. One dose is recommended  after age 46.  Meningococcal vaccine. You may need this if you have certain conditions.  Hepatitis A vaccine. You may need this if you have certain conditions or if you travel or work in places where you may be exposed to hepatitis A.  Hepatitis B vaccine. You may need this if you have certain conditions or if you travel or work in places where you may be exposed to hepatitis B.  Haemophilus influenzae type b (Hib) vaccine. You may need this if you have certain risk factors.  Talk to your health care provider about which screenings and vaccines you need and how often you need them. This information is not intended to replace advice given to you by your health care provider. Make sure you discuss any questions you have with your health care provider. Document Released: 12/05/2015 Document Revised: 07/28/2016 Document Reviewed: 09/09/2015 Elsevier Interactive Patient Education  [employer].

## 2018-08-11 NOTE — Telephone Encounter (Signed)
Recommend increase fluids over the weekend.  Repeat CBC early next week with him well-hydrated.  If hgb still elevated will have to get further assessment.

## 2018-08-11 NOTE — Telephone Encounter (Signed)
Orders have been placed for repeat CBC

## 2018-08-11 NOTE — Telephone Encounter (Signed)
LMOVM advising patient to call back for lab results. See annotation below from PCP

## 2018-08-11 NOTE — Telephone Encounter (Signed)
CRITICAL VALUE STICKER  CRITICAL VALUE: Hemoglobin: 18.1  RECEIVER (on-site recipient of call): Roberts NOTIFIED: 2:05pm 08/11/18  MESSENGER (representative from lab): Earnest Bailey  MD NOTIFIED: Elyn Aquas, PA  TIME OF NOTIFICATION: 2:05pm 08/11/18

## 2018-08-14 ENCOUNTER — Other Ambulatory Visit: Payer: Self-pay | Admitting: Physician Assistant

## 2018-08-14 ENCOUNTER — Other Ambulatory Visit (INDEPENDENT_AMBULATORY_CARE_PROVIDER_SITE_OTHER): Payer: Medicare Other

## 2018-08-14 ENCOUNTER — Telehealth: Payer: Self-pay | Admitting: Cardiology

## 2018-08-14 DIAGNOSIS — D582 Other hemoglobinopathies: Secondary | ICD-10-CM | POA: Diagnosis not present

## 2018-08-14 LAB — CBC WITH DIFFERENTIAL/PLATELET
BASOS PCT: 0.7 % (ref 0.0–3.0)
Basophils Absolute: 0 10*3/uL (ref 0.0–0.1)
EOS ABS: 0.1 10*3/uL (ref 0.0–0.7)
EOS PCT: 4 % (ref 0.0–5.0)
HEMATOCRIT: 51.5 % (ref 39.0–52.0)
HEMOGLOBIN: 16.9 g/dL (ref 13.0–17.0)
LYMPHS PCT: 39.7 % (ref 12.0–46.0)
Lymphs Abs: 1.2 10*3/uL (ref 0.7–4.0)
MCHC: 32.9 g/dL (ref 30.0–36.0)
MCV: 84.4 fl (ref 78.0–100.0)
MONO ABS: 0.8 10*3/uL (ref 0.1–1.0)
Monocytes Relative: 26.6 % — ABNORMAL HIGH (ref 3.0–12.0)
Neutro Abs: 0.9 10*3/uL — ABNORMAL LOW (ref 1.4–7.7)
Neutrophils Relative %: 29 % — ABNORMAL LOW (ref 43.0–77.0)
Platelets: 190 10*3/uL (ref 150.0–400.0)
RBC: 6.1 Mil/uL — AB (ref 4.22–5.81)
RDW: 14.7 % (ref 11.5–15.5)
WBC: 3 10*3/uL — AB (ref 4.0–10.5)

## 2018-08-14 NOTE — Telephone Encounter (Signed)
Pt c/o BP issue:  1. What are your last 5 BP readings? 09/20 am 96/68 pulse 88, 135/86 pluses 86 11 pm, 09/21 8:30 am 160/110 pluses 91. 6 pm 108/76 pluses 89, 0922 8:30 am 117/80 pluses 91 2. Are you having any other symptoms (ex. Dizziness, headache, blurred vision, passed out)? no 3. What is your medication issue? Patient concern pulse keep going on, Need to speak to a nurse.

## 2018-08-14 NOTE — Telephone Encounter (Signed)
Pt is going to discuss his BP concerns w/ his PCP. He states that his HRs are jumping from 50 to 100, but avg 90-100bpm.   He reports having a spinal injection recently and he stopped all his medications for a week b/c he misunderstood their directions.  Will forward to Dr. [name] for advisement.  Pt understands it may be a few days before a return call.    (Dr. [name] -- do you want to increase Amiodarone dose for week/two?)

## 2018-08-17 NOTE — Telephone Encounter (Signed)
Informed patient Dr. [name] recommended EKG to determine rhythm. Pt informs me that he is having a lot of back pain right now which is probably contributing to to his HR jumping around.  Along with the fact that he was off all of his medications for a week d/t a misunderstanding of medication instructions prior to a spinal injection.  He understands Dr. [name] would like an EKG for evaluation. He is scheduled for tomorrow.

## 2018-08-18 ENCOUNTER — Ambulatory Visit: Payer: Medicare Other

## 2018-08-21 NOTE — Progress Notes (Signed)
RN Medicare Wellness note reviewed. Patient has follow-up with me after this appt.   Leeanne Rio, PA-C

## 2018-08-24 ENCOUNTER — Ambulatory Visit
Admission: RE | Admit: 2018-08-24 | Discharge: 2018-08-24 | Disposition: A | Discharge status: Home / Self Care | Payer: Medicare Other | Source: Ambulatory Visit | Attending: Physician Assistant | Admitting: Physician Assistant

## 2018-08-24 DIAGNOSIS — R160 Hepatomegaly, not elsewhere classified: Secondary | ICD-10-CM | POA: Diagnosis not present

## 2018-08-25 ENCOUNTER — Other Ambulatory Visit: Payer: Self-pay

## 2018-08-25 ENCOUNTER — Ambulatory Visit: Payer: Medicare Other | Admitting: Physician Assistant

## 2018-08-25 ENCOUNTER — Encounter: Payer: Self-pay | Admitting: Physician Assistant

## 2018-08-25 DIAGNOSIS — I10 Essential (primary) hypertension: Secondary | ICD-10-CM | POA: Diagnosis not present

## 2018-08-25 NOTE — Assessment & Plan Note (Signed)
Stable now that his BP medications have been restarted. Continue current regimen. Follow-up 3 weeks.

## 2018-08-25 NOTE — Patient Instructions (Signed)
BP much better today. Continue current regimen. I will see you in 3 months.  Return sooner if needed!   DASH Eating Plan DASH stands for "Dietary Approaches to Stop Hypertension." The DASH eating plan is a healthy eating plan that has been shown to reduce high blood pressure (hypertension). It may also reduce your risk for type 2 diabetes, heart disease, and stroke. The DASH eating plan may also help with weight loss. What are tips for following this plan? General guidelines  Avoid eating more than 2,300 mg (milligrams) of salt (sodium) a day. If you have hypertension, you may need to reduce your sodium intake to 1,500 mg a day.  Limit alcohol intake to no more than 1 drink a day for nonpregnant women and 2 drinks a day for men. One drink equals 12 oz of beer, 5 oz of wine, or 1 oz of hard liquor.  Work with your health care provider to maintain a healthy body weight or to lose weight. Ask what an ideal weight is for you.  Get at least 30 minutes of exercise that causes your heart to beat faster (aerobic exercise) most days of the week. Activities may include walking, swimming, or biking.  Work with your health care provider or diet and nutrition specialist (dietitian) to adjust your eating plan to your individual calorie needs. Reading food labels  Check food labels for the amount of sodium per serving. Choose foods with less than 5 percent of the Daily Value of sodium. Generally, foods with less than 300 mg of sodium per serving fit into this eating plan.  To find whole grains, look for the word "whole" as the first word in the ingredient list. Shopping  Buy products labeled as "low-sodium" or "no salt added."  Buy fresh foods. Avoid canned foods and premade or frozen meals. Cooking  Avoid adding salt when cooking. Use salt-free seasonings or herbs instead of table salt or sea salt. Check with your health care provider or pharmacist before using salt substitutes.  Do not fry  foods. Cook foods using healthy methods such as baking, boiling, grilling, and broiling instead.  Cook with heart-healthy oils, such as olive, canola, soybean, or sunflower oil. Meal planning   Eat a balanced diet that includes: ? 5 or more servings of fruits and vegetables each day. At each meal, try to fill half of your plate with fruits and vegetables. ? Up to 6-8 servings of whole grains each day. ? Less than 6 oz of lean meat, poultry, or fish each day. A 3-oz serving of meat is about the same size as a deck of cards. One egg equals 1 oz. ? 2 servings of low-fat dairy each day. ? A serving of nuts, seeds, or beans 5 times each week. ? Heart-healthy fats. Healthy fats called Omega-3 fatty acids are found in foods such as flaxseeds and coldwater fish, like sardines, salmon, and mackerel.  Limit how much you eat of the following: ? Canned or prepackaged foods. ? Food that is high in trans fat, such as fried foods. ? Food that is high in saturated fat, such as fatty meat. ? Sweets, desserts, sugary drinks, and other foods with added sugar. ? Full-fat dairy products.  Do not salt foods before eating.  Try to eat at least 2 vegetarian meals each week.  Eat more home-cooked food and less restaurant, buffet, and fast food.  When eating at a restaurant, ask that your food be prepared with less salt or no salt,  if possible. What foods are recommended? The items listed may not be a complete list. Talk with your dietitian about what dietary choices are best for you. Grains Whole-grain or whole-wheat bread. Whole-grain or whole-wheat pasta. Brown rice. [place]. Bulgur. Whole-grain and low-sodium cereals. Pita bread. Low-fat, low-sodium crackers. Whole-wheat flour tortillas. Vegetables Fresh or frozen vegetables (raw, steamed, roasted, or grilled). Low-sodium or reduced-sodium tomato and vegetable juice. Low-sodium or reduced-sodium tomato sauce and tomato paste. Low-sodium or  reduced-sodium canned vegetables. Fruits All fresh, dried, or frozen fruit. Canned fruit in natural juice (without added sugar). Meat and other protein foods Skinless chicken or [place]. Ground chicken or [place]. Pork with fat trimmed off. Fish and seafood. Egg whites. Dried beans, peas, or lentils. Unsalted nuts, nut butters, and seeds. Unsalted canned beans. Lean cuts of beef with fat trimmed off. Low-sodium, lean deli meat. Dairy Low-fat (1%) or fat-free (skim) milk. Fat-free, low-fat, or reduced-fat cheeses. Nonfat, low-sodium ricotta or cottage cheese. Low-fat or nonfat yogurt. Low-fat, low-sodium cheese. Fats and oils Soft margarine without trans fats. Vegetable oil. Low-fat, reduced-fat, or light mayonnaise and salad dressings (reduced-sodium). Canola, safflower, olive, soybean, and sunflower oils. Avocado. Seasoning and other foods Herbs. Spices. Seasoning mixes without salt. Unsalted popcorn and pretzels. Fat-free sweets. What foods are not recommended? The items listed may not be a complete list. Talk with your dietitian about what dietary choices are best for you. Grains Baked goods made with fat, such as croissants, muffins, or some breads. Dry pasta or rice meal packs. Vegetables Creamed or fried vegetables. Vegetables in a cheese sauce. Regular canned vegetables (not low-sodium or reduced-sodium). Regular canned tomato sauce and paste (not low-sodium or reduced-sodium). Regular tomato and vegetable juice (not low-sodium or reduced-sodium). Angie Fava. Olives. Fruits Canned fruit in a light or heavy syrup. Fried fruit. Fruit in cream or butter sauce. Meat and other protein foods Fatty cuts of meat. Ribs. Fried meat. Berniece Salines. Sausage. Bologna and other processed lunch meats. Salami. Fatback. Hotdogs. Bratwurst. Salted nuts and seeds. Canned beans with added salt. Canned or smoked fish. Whole eggs or egg yolks. Chicken or [place] with skin. Dairy Whole or 2% milk, cream, and half-and-half.  Whole or full-fat cream cheese. Whole-fat or sweetened yogurt. Full-fat cheese. Nondairy creamers. Whipped toppings. Processed cheese and cheese spreads. Fats and oils Butter. Stick margarine. Lard. Shortening. Ghee. Bacon fat. Tropical oils, such as coconut, palm kernel, or palm oil. Seasoning and other foods Salted popcorn and pretzels. Onion salt, garlic salt, seasoned salt, table salt, and sea salt. Worcestershire sauce. Tartar sauce. Barbecue sauce. Teriyaki sauce. Soy sauce, including reduced-sodium. Steak sauce. Canned and packaged gravies. Fish sauce. Oyster sauce. Cocktail sauce. Horseradish that you find on the shelf. Ketchup. Mustard. Meat flavorings and tenderizers. Bouillon cubes. Hot sauce and Tabasco sauce. Premade or packaged marinades. Premade or packaged taco seasonings. Relishes. Regular salad dressings. Where to find more information:  National Heart, Lung, and [place]: https://wilson-eaton.com/  American Heart Association: www.heart.org Summary  The DASH eating plan is a healthy eating plan that has been shown to reduce high blood pressure (hypertension). It may also reduce your risk for type 2 diabetes, heart disease, and stroke.  With the DASH eating plan, you should limit salt (sodium) intake to 2,300 mg a day. If you have hypertension, you may need to reduce your sodium intake to 1,500 mg a day.  When on the DASH eating plan, aim to eat more fresh fruits and vegetables, whole grains, lean proteins, low-fat dairy, and heart-healthy fats.  Work with your health care provider or diet and nutrition specialist (dietitian) to adjust your eating plan to your individual calorie needs. This information is not intended to replace advice given to you by your health care provider. Make sure you discuss any questions you have with your health care provider. Document Released: 10/28/2011 Document Revised: 11/01/2016 Document Reviewed: 11/01/2016 Elsevier Interactive Patient Education   [employer].

## 2018-08-25 NOTE — Progress Notes (Signed)
Patient presents to clinic today for follow-up of hypertension after restarting all medications as directed. Patient endorses taking medications daily. Still tolerating well. Patient denies chest pain, palpitations, lightheadedness, dizziness, vision changes or frequent headaches.  BP Readings from Last 3 Encounters:  08/25/18 136/88  08/11/18 (!) 132/92  08/11/18 (!) 162/100   Past Medical History:  Diagnosis Date  . Colon polyps   . History of chicken pox   . Hyperlipidemia   . Hypertension   . Internal hemorrhoids   . Irregular heartbeat   . Sciatica   . Stroke [facility]) 2008    Current Outpatient Medications on File Prior to Visit  Medication Sig Dispense Refill  . amiodarone (PACERONE) 200 MG tablet Take 1 tablet (200 mg total) by mouth daily. 90 tablet 2  . apixaban (ELIQUIS) 5 MG TABS tablet Take 1 tablet (5 mg total) by mouth 2 (two) times daily. 60 tablet 6  . chlorthalidone (HYGROTON) 25 MG tablet TAKE 1 TABLET BY MOUTH ONCE DAILY 90 tablet 0  . cloNIDine (CATAPRES - DOSED IN MG/24 HR) 0.2 mg/24hr patch Place 1 patch onto the skin once a week.  0  . cyclobenzaprine (FLEXERIL) 10 MG tablet Take 1 tablet by mouth as needed for muscle spasms.    Marland Kitchen doxazosin (CARDURA) 1 MG tablet Take 1 mg by mouth daily.    Marland Kitchen gabapentin (NEURONTIN) 100 MG capsule TAKE 1 CAPSULE BY MOUTH IN THE MORNING, 1 CAPSULE IN THE AFTERNOON AND 3 CAPSULES IN THE EVENING 150 capsule 3  . potassium chloride SA (K-DUR,KLOR-CON) 20 MEQ tablet Take 20 mEq by mouth 2 (two) times daily.    . psyllium (REGULOID) 0.52 g capsule Take by mouth.     No current facility-administered medications on file prior to visit.     Allergies  Allergen Reactions  . Crestor [Rosuvastatin Calcium] Shortness Of Breath    Muscle cramps  . Spironolactone Shortness Of Breath  . Terazosin Shortness Of Breath, Palpitations and Other (See Comments)    Nerve pain  . Atenolol Other (See Comments)    Drowsiness and "flu sxs"  .  Bystolic [Nebivolol Hcl] Other (See Comments)    GI issues  . Hydralazine Other (See Comments)    Joint swelling  . Tizanidine Other (See Comments)    Syncope, Elevated BP/pulse  . Amlodipine Other (See Comments)  . Clonidine Other (See Comments)  . Hydrocodone-Acetaminophen Other (See Comments)  . Levofloxacin Other (See Comments)  . Losartan Other (See Comments)    Did not work.  Pt states he couldn't take b/c of SE, but can't remember what SE were.  . Maxzide [Hydrochlorothiazide W-Triamterene] Other (See Comments)    Does not tolerate potassium-sparing diuretics  . Olmesartan Other (See Comments)  . Tape Other (See Comments)    Other reaction(s): Unknown  . Tussionex Pennkinetic Er [Hydrocod Polst-Cpm Polst Er] Other (See Comments)    Other reaction(s): Unknown    Family History  Problem Relation Age of Onset  . Hypertension Father 37       Deceased  . Heart disease Father   . Lung cancer Mother 84       Deceased  . Healthy Sister        x3  . Healthy Brother        x4  . Heart disease Brother        #5  . Other Daughter        Alpha Thalassemia  . Lupus Daughter        #  2-deceased  . Diabetes Neg Hx   . Heart attack Neg Hx   . Hyperlipidemia Neg Hx   . Sudden death Neg Hx     Social History   Socioeconomic History  . Marital status: Married    Spouse name: Not on file  . Number of children: 1  . Years of education: 69  . Highest education level: Not on file  Occupational History  . Occupation: Retired  Scientific laboratory technician  . Financial resource strain: Not on file  . Food insecurity:    Worry: Not on file    Inability: Not on file  . Transportation needs:    Medical: Not on file    Non-medical: Not on file  Tobacco Use  . Smoking status: Never Smoker  . Smokeless tobacco: Never Used  Substance and Sexual Activity  . Alcohol use: No  . Drug use: No  . Sexual activity: Not on file  Lifestyle  . Physical activity:    Days per week: Not on file     Minutes per session: Not on file  . Stress: Not on file  Relationships  . Social connections:    Talks on phone: Not on file    Gets together: Not on file    Attends religious service: Not on file    Active member of club or organization: Not on file    Attends meetings of clubs or organizations: Not on file    Relationship status: Not on file  Other Topics Concern  . Not on file  Social History Narrative   Fun: Play golf and build furniture.    Review of Systems - See HPI.  All other ROS are negative.  BP 136/88   Pulse 86   Temp 97.9 F (36.6 C) (Oral)   Resp 14   Ht 5\' 8"  (1.727 m)   Wt 209 lb (94.8 kg)   SpO2 99%   BMI 31.78 kg/m   Physical Exam  Constitutional: He appears well-developed and well-nourished.  HENT:  Head: Normocephalic and atraumatic.  Eyes: Conjunctivae and EOM are normal.  Neck: Neck supple.  Cardiovascular: Normal rate, regular rhythm, normal heart sounds and intact distal pulses.  Pulmonary/Chest: Effort normal and breath sounds normal.  Vitals reviewed.   Recent Results (from the past 2160 hour(s))  CBC with Differential/Platelet     Status: Abnormal   Collection Time: 08/11/18 10:01 AM  Result Value Ref Range   WBC 3.0 (L) 4.0 - 10.5 K/uL   RBC 6.51 (H) 4.22 - 5.81 Mil/uL   Hemoglobin 18.1 Repeated and verified X2. (HH) 13.0 - 17.0 g/dL   HCT 54.8 (H) 39.0 - 52.0 %   MCV 84.2 78.0 - 100.0 fl   MCHC 33.0 30.0 - 36.0 g/dL   RDW 14.8 11.5 - 15.5 %   Platelets 176.0 150.0 - 400.0 K/uL   Neutrophils Relative % 40.8 (L) 43.0 - 77.0 %   Lymphocytes Relative 28.5 12.0 - 46.0 %   Monocytes Relative 27.3 (H) 3.0 - 12.0 %   Eosinophils Relative 2.8 0.0 - 5.0 %   Basophils Relative 0.6 0.0 - 3.0 %   Neutro Abs 1.2 (L) 1.4 - 7.7 K/uL   Lymphs Abs 0.9 0.7 - 4.0 K/uL   Monocytes Absolute 0.8 0.1 - 1.0 K/uL   Eosinophils Absolute 0.1 0.0 - 0.7 K/uL   Basophils Absolute 0.0 0.0 - 0.1 K/uL  Comprehensive metabolic panel     Status: Abnormal    Collection Time: 08/11/18 10:01  AM  Result Value Ref Range   Sodium 139 135 - 145 mEq/L   Potassium 3.7 3.5 - 5.1 mEq/L   Chloride 98 96 - 112 mEq/L   CO2 34 (H) 19 - 32 mEq/L   Glucose, Bld 85 70 - 99 mg/dL   BUN 15 6 - 23 mg/dL   Creatinine, Ser 1.55 (H) 0.40 - 1.50 mg/dL   Total Bilirubin 1.3 (H) 0.2 - 1.2 mg/dL   Alkaline Phosphatase 84 39 - 117 U/L   AST 33 0 - 37 U/L   ALT 33 0 - 53 U/L   Total Protein 7.4 6.0 - 8.3 g/dL   Albumin 4.1 3.5 - 5.2 g/dL   Calcium 9.6 8.4 - 10.5 mg/dL   GFR 56.73 (L) >60.00 mL/min  Lipid panel     Status: Abnormal   Collection Time: 08/11/18 10:01 AM  Result Value Ref Range   Cholesterol 248 (H) 0 - 200 mg/dL    Comment: ATP III Classification       Desirable:  < 200 mg/dL               Borderline High:  200 - 239 mg/dL          High:  > = 240 mg/dL   Triglycerides 143.0 0.0 - 149.0 mg/dL    Comment: Normal:  <150 mg/dLBorderline High:  150 - 199 mg/dL   HDL 37.90 (L) >39.00 mg/dL   VLDL 28.6 0.0 - 40.0 mg/dL   LDL Cholesterol 181 (H) 0 - 99 mg/dL   Total CHOL/HDL Ratio 7     Comment:                Men          Women1/2 Average Risk     3.4          3.3Average Risk          5.0          4.42X Average Risk          9.6          7.13X Average Risk          15.0          11.0                       NonHDL 209.64     Comment: NOTE:  Non-HDL goal should be 30 mg/dL higher than patient's LDL goal (i.e. LDL goal of < 70 mg/dL, would have non-HDL goal of < 100 mg/dL)  Hemoglobin A1c     Status: None   Collection Time: 08/11/18 10:01 AM  Result Value Ref Range   Hgb A1c MFr Bld 6.0 4.6 - 6.5 %    Comment: Glycemic Control Guidelines for People with Diabetes:Non Diabetic:  <6%Goal of Therapy: <7%Additional Action Suggested:  >8%   PSA     Status: Abnormal   Collection Time: 08/11/18 10:01 AM  Result Value Ref Range   PSA 8.73 (H) 0.10 - 4.00 ng/mL    Comment: Test performed using Access [employer]britech PSA Assay, a parmagnetic partical, chemiluminecent  immunoassay.  TSH     Status: None   Collection Time: 08/11/18 10:01 AM  Result Value Ref Range   TSH 1.29 0.35 - 4.50 uIU/mL  CBC with Differential/Platelet     Status: Abnormal   Collection Time: 08/14/18  8:03 AM  Result Value Ref Range   WBC 3.0 (L) 4.0 - 10.5 K/uL  RBC 6.10 (H) 4.22 - 5.81 Mil/uL   Hemoglobin 16.9 13.0 - 17.0 g/dL   HCT 51.5 39.0 - 52.0 %   MCV 84.4 78.0 - 100.0 fl   MCHC 32.9 30.0 - 36.0 g/dL   RDW 14.7 11.5 - 15.5 %   Platelets 190.0 150.0 - 400.0 K/uL   Neutrophils Relative % 29.0 (L) 43.0 - 77.0 %   Lymphocytes Relative 39.7 12.0 - 46.0 %   Monocytes Relative 26.6 Repeated and verified X2. (H) 3.0 - 12.0 %   Eosinophils Relative 4.0 0.0 - 5.0 %   Basophils Relative 0.7 0.0 - 3.0 %   Neutro Abs 0.9 (L) 1.4 - 7.7 K/uL   Lymphs Abs 1.2 0.7 - 4.0 K/uL   Monocytes Absolute 0.8 0.1 - 1.0 K/uL   Eosinophils Absolute 0.1 0.0 - 0.7 K/uL   Basophils Absolute 0.0 0.0 - 0.1 K/uL    Assessment/Plan: Essential hypertension Stable now that his BP medications have been restarted. Continue current regimen. Follow-up 3 weeks.      Leeanne Rio, PA-C

## 2018-09-08 ENCOUNTER — Other Ambulatory Visit: Payer: Self-pay | Admitting: Physician Assistant

## 2018-09-13 DIAGNOSIS — M5416 Radiculopathy, lumbar region: Secondary | ICD-10-CM | POA: Diagnosis not present

## 2018-09-13 DIAGNOSIS — I10 Essential (primary) hypertension: Secondary | ICD-10-CM | POA: Diagnosis not present

## 2018-09-13 DIAGNOSIS — M47896 Other spondylosis, lumbar region: Secondary | ICD-10-CM | POA: Diagnosis not present

## 2018-09-13 DIAGNOSIS — M5137 Other intervertebral disc degeneration, lumbosacral region: Secondary | ICD-10-CM | POA: Diagnosis not present

## 2018-10-10 ENCOUNTER — Ambulatory Visit: Payer: Medicare Other | Admitting: Physician Assistant

## 2018-10-10 ENCOUNTER — Other Ambulatory Visit: Payer: Self-pay

## 2018-10-10 ENCOUNTER — Encounter: Payer: Self-pay | Admitting: Physician Assistant

## 2018-10-10 VITALS — BP 148/82 | HR 51 | Temp 97.6°F | Resp 14 | Ht 68.0 in | Wt 208.0 lb

## 2018-10-10 DIAGNOSIS — H6121 Impacted cerumen, right ear: Secondary | ICD-10-CM | POA: Diagnosis not present

## 2018-10-10 NOTE — Progress Notes (Signed)
Acute Office Visit  Subjective:    Patient ID: Patrick Roth, male    DOB: [birth year], 72 y.o.   MRN: [id number]  Chief Complaint  Patient presents with  . Ear Fullness    HPI Patient is in today for fullness sensation in R ear over the past few days. Denies ear pain or change in hearing. Denies drainage from ear. Tried to stick pinky in ear canal and felt something hard. Denies symptoms of L ear.  Past Medical History:  Diagnosis Date  . Colon polyps   . History of chicken pox   . Hyperlipidemia   . Hypertension   . Internal hemorrhoids   . Irregular heartbeat   . Sciatica   . Stroke [facility]) 2008    Past Surgical History:  Procedure Laterality Date  . CARDIOVERSION N/A 09/02/2017   Procedure: CARDIOVERSION;  Surgeon: [name], MD;  Location: [facility] ENDOSCOPY;  Service: Cardiovascular;  Laterality: N/A;  . COLONOSCOPY  2011  . great toe surgery    . PROSTATE BIOPSY      Family History  Problem Relation Age of Onset  . Hypertension Father 2       Deceased  . Heart disease Father   . Lung cancer Mother 32       Deceased  . Healthy Sister        x3  . Healthy Brother        x4  . Heart disease Brother        #5  . Other Daughter        Alpha Thalassemia  . Lupus Daughter        #2-deceased  . Diabetes Neg Hx   . Heart attack Neg Hx   . Hyperlipidemia Neg Hx   . Sudden death Neg Hx     Social History   Socioeconomic History  . Marital status: Married    Spouse name: Not on file  . Number of children: 1  . Years of education: 13  . Highest education level: Not on file  Occupational History  . Occupation: Retired  Scientific laboratory technician  . Financial resource strain: Not on file  . Food insecurity:    Worry: Not on file    Inability: Not on file  . Transportation needs:    Medical: Not on file    Non-medical: Not on file  Tobacco Use  . Smoking status: Never Smoker  . Smokeless tobacco: Never Used  Substance and Sexual Activity  . Alcohol use: No    . Drug use: No  . Sexual activity: Not on file  Lifestyle  . Physical activity:    Days per week: Not on file    Minutes per session: Not on file  . Stress: Not on file  Relationships  . Social connections:    Talks on phone: Not on file    Gets together: Not on file    Attends religious service: Not on file    Active member of club or organization: Not on file    Attends meetings of clubs or organizations: Not on file    Relationship status: Not on file  . Intimate partner violence:    Fear of current or ex partner: Not on file    Emotionally abused: Not on file    Physically abused: Not on file    Forced sexual activity: Not on file  Other Topics Concern  . Not on file  Social History Narrative   Fun: Play  golf and build furniture.     Outpatient Medications Prior to Visit  Medication Sig Dispense Refill  . amiodarone (PACERONE) 200 MG tablet Take 1 tablet (200 mg total) by mouth daily. 90 tablet 2  . apixaban (ELIQUIS) 5 MG TABS tablet Take 1 tablet (5 mg total) by mouth 2 (two) times daily. 60 tablet 6  . chlorthalidone (HYGROTON) 25 MG tablet TAKE 1 TABLET BY MOUTH ONCE DAILY 90 tablet 0  . cloNIDine (CATAPRES - DOSED IN MG/24 HR) 0.2 mg/24hr patch Place 1 patch onto the skin once a week.  0  . cyclobenzaprine (FLEXERIL) 10 MG tablet Take 1 tablet by mouth as needed for muscle spasms.    Marland Kitchen doxazosin (CARDURA) 1 MG tablet Take 1 mg by mouth daily.    Marland Kitchen gabapentin (NEURONTIN) 100 MG capsule TAKE 1 CAPSULE BY MOUTH IN THE MORNING, 1 CAPSULE IN THE AFTERNOON AND 3 CAPSULES IN THE EVENING 150 capsule 3  . potassium chloride SA (K-DUR,KLOR-CON) 20 MEQ tablet Take 20 mEq by mouth 2 (two) times daily.    . psyllium (REGULOID) 0.52 g capsule Take by mouth.     No facility-administered medications prior to visit.     Allergies  Allergen Reactions  . Crestor [Rosuvastatin Calcium] Shortness Of Breath    Muscle cramps  . Spironolactone Shortness Of Breath  . Terazosin  Shortness Of Breath, Palpitations and Other (See Comments)    Nerve pain  . Atenolol Other (See Comments)    Drowsiness and "flu sxs"  . Bystolic [Nebivolol Hcl] Other (See Comments)    GI issues  . Hydralazine Other (See Comments)    Joint swelling  . Tizanidine Other (See Comments)    Syncope, Elevated BP/pulse  . Amlodipine Other (See Comments)  . Clonidine Other (See Comments)  . Hydrocodone-Acetaminophen Other (See Comments)  . Levofloxacin Other (See Comments)  . Losartan Other (See Comments)    Did not work.  Pt states he couldn't take b/c of SE, but can't remember what SE were.  . Maxzide [Hydrochlorothiazide W-Triamterene] Other (See Comments)    Does not tolerate potassium-sparing diuretics  . Olmesartan Other (See Comments)  . Tape Other (See Comments)    Other reaction(s): Unknown  . Tussionex Pennkinetic Er [Hydrocod Polst-Cpm Polst Er] Other (See Comments)    Other reaction(s): Unknown   ROS Pertinent ROS are listed in the HPI.    Objective:    Physical Exam  Constitutional: He appears well-developed and well-nourished.  HENT:  Head: Normocephalic and atraumatic.  Left Ear: Tympanic membrane normal.  Nose: Nose normal.  Mouth/Throat: Oropharynx is clear and moist. No oropharyngeal exudate.  Cerumen impaction noted of R ear canal.  Eyes: Conjunctivae are normal.  Neck: Neck supple.  Cardiovascular: Normal rate and regular rhythm.  Pulmonary/Chest: Effort normal.  Vitals reviewed.   BP (!) 148/82   Pulse (!) 51   Temp 97.6 F (36.4 C) (Oral)   Resp 14   Ht 5\' 8"  (1.727 m)   Wt 208 lb (94.3 kg)   SpO2 98%   BMI 31.63 kg/m  Wt Readings from Last 3 Encounters:  10/10/18 208 lb (94.3 kg)  08/25/18 209 lb (94.8 kg)  08/11/18 207 lb (93.9 kg)    There are no preventive care reminders to display for this patient.  There are no preventive care reminders to display for this patient.   Lab Results  Component Value Date   TSH 1.29 08/11/2018   Lab  Results  Component Value  Date   WBC 3.0 (L) 08/14/2018   HGB 16.9 08/14/2018   HCT 51.5 08/14/2018   MCV 84.4 08/14/2018   PLT 190.0 08/14/2018   Lab Results  Component Value Date   NA 139 08/11/2018   K 3.7 08/11/2018   CO2 34 (H) 08/11/2018   GLUCOSE 85 08/11/2018   BUN 15 08/11/2018   CREATININE 1.55 (H) 08/11/2018   BILITOT 1.3 (H) 08/11/2018   ALKPHOS 84 08/11/2018   AST 33 08/11/2018   ALT 33 08/11/2018   PROT 7.4 08/11/2018   ALBUMIN 4.1 08/11/2018   CALCIUM 9.6 08/11/2018   ANIONGAP 9 09/02/2017   GFR 56.73 (L) 08/11/2018   Lab Results  Component Value Date   CHOL 248 (H) 08/11/2018   Lab Results  Component Value Date   HDL 37.90 (L) 08/11/2018   Lab Results  Component Value Date   LDLCALC 181 (H) 08/11/2018   Lab Results  Component Value Date   TRIG 143.0 08/11/2018   Lab Results  Component Value Date   CHOLHDL 7 08/11/2018   Lab Results  Component Value Date   HGBA1C 6.0 08/11/2018     Assessment & Plan:   1. Impacted cerumen of right ear Removed vial lavage and alligator forceps. Some remnant cerumen unable to be removed today. Home measures reviewed. Debrox recommended. Follow-up later in the week for repeat lavage if symptoms recur.   No orders of the defined types were placed in this encounter.    Leeanne Rio, PA-C

## 2018-10-10 NOTE — Patient Instructions (Signed)
Please apply some Debrox (over-the-counter) into the right ear canal as directed for a few days to help soften wax. Get an over-the-counter ear wash kit to try and flush out the rest of the wax. You can also come see [place] later this week -- Thursday or Friday for repeat assessment and irrigation to remove the rest of the wax.   Earwax Buildup, Adult The ears produce a substance called earwax that helps keep bacteria out of the ear and protects the skin in the ear canal. Occasionally, earwax can build up in the ear and cause discomfort or hearing loss. What increases the risk? This condition is more likely to develop in people who:  Are male.  Are elderly.  Naturally produce more earwax.  Clean their ears often with cotton swabs.  Use earplugs often.  Use in-ear headphones often.  Wear hearing aids.  Have narrow ear canals.  Have earwax that is overly thick or sticky.  Have eczema.  Are dehydrated.  Have excess hair in the ear canal.  What are the signs or symptoms? Symptoms of this condition include:  Reduced or muffled hearing.  A feeling of fullness in the ear or feeling that the ear is plugged.  Fluid coming from the ear.  Ear pain.  Ear itch.  Ringing in the ear.  Coughing.  An obvious piece of earwax that can be seen inside the ear canal.  How is this diagnosed? This condition may be diagnosed based on:  Your symptoms.  Your medical history.  An ear exam. During the exam, your health care provider will look into your ear with an instrument called an otoscope.  You may have tests, including a hearing test. How is this treated? This condition may be treated by:  Using ear drops to soften the earwax.  Having the earwax removed by a health care provider. The health care provider may: ? Flush the ear with water. ? Use an instrument that has a loop on the end (curette). ? Use a suction device.  Surgery to remove the wax buildup. This may be done  in severe cases.  Follow these instructions at home:  Take over-the-counter and prescription medicines only as told by your health care provider.  Do not put any objects, including cotton swabs, into your ear. You can clean the opening of your ear canal with a washcloth or facial tissue.  Follow instructions from your health care provider about cleaning your ears. Do not over-clean your ears.  Drink enough fluid to keep your urine clear or pale yellow. This will help to thin the earwax.  Keep all follow-up visits as told by your health care provider. If earwax builds up in your ears often or if you use hearing aids, consider seeing your health care provider for routine, preventive ear cleanings. Ask your health care provider how often you should schedule your cleanings.  If you have hearing aids, clean them according to instructions from the manufacturer and your health care provider. Contact a health care provider if:  You have ear pain.  You develop a fever.  You have blood, pus, or other fluid coming from your ear.  You have hearing loss.  You have ringing in your ears that does not go away.  Your symptoms do not improve with treatment.  You feel like the room is spinning (vertigo). Summary  Earwax can build up in the ear and cause discomfort or hearing loss.  The most common symptoms of this condition include  reduced or muffled hearing and a feeling of fullness in the ear or feeling that the ear is plugged.  This condition may be diagnosed based on your symptoms, your medical history, and an ear exam.  This condition may be treated by using ear drops to soften the earwax or by having the earwax removed by a health care provider.  Do not put any objects, including cotton swabs, into your ear. You can clean the opening of your ear canal with a washcloth or facial tissue. This information is not intended to replace advice given to you by your health care provider. Make sure  you discuss any questions you have with your health care provider. Document Released: 12/16/2004 Document Revised: 01/19/2017 Document Reviewed: 01/19/2017 Elsevier Interactive Patient Education  [employer].

## 2018-10-16 ENCOUNTER — Encounter: Payer: Self-pay | Admitting: *Deleted

## 2018-10-17 ENCOUNTER — Encounter: Payer: Self-pay | Admitting: Cardiology

## 2018-10-17 ENCOUNTER — Ambulatory Visit: Payer: Medicare Other | Admitting: Cardiology

## 2018-10-17 VITALS — BP 130/80 | HR 57 | Ht 68.0 in | Wt 207.0 lb

## 2018-10-17 DIAGNOSIS — I10 Essential (primary) hypertension: Secondary | ICD-10-CM | POA: Diagnosis not present

## 2018-10-17 DIAGNOSIS — I480 Paroxysmal atrial fibrillation: Secondary | ICD-10-CM

## 2018-10-17 DIAGNOSIS — I483 Typical atrial flutter: Secondary | ICD-10-CM

## 2018-10-17 NOTE — Patient Instructions (Signed)
Medication Instructions:  Your physician recommends that you continue on your current medications as directed. Please refer to the Current Medication list given to you today.  If you need a refill on your cardiac medications before your next appointment, please call your pharmacy.   Labwork: None ordered  Testing/Procedures: None ordered  Follow-Up: Your physician wants you to follow-up in: 6 months with Dr. Camnitz.  You will receive a reminder letter in the mail two months in advance. If you don't receive a letter, please call our office to schedule the follow-up appointment.  Thank you for choosing [facility]!!   Demonica Farrey, RN [phone]         

## 2018-10-17 NOTE — Progress Notes (Signed)
Electrophysiology Office Note   Date:  10/17/2018   ID:  Patrick Roth, DOB 1946/05/05, MRN [id number]  PCP:  Brunetta Jeans, PA-C  Cardiologist:  [name] Primary Electrophysiologist:  [name], MD    No chief complaint on file.    History of Present Illness: Patrick Roth is a 72 y.o. male who presents today for electrophysiology evaluation.   Hx of HTN and multiple drug intolerances, HL, prior CVA. He was admitted in 7/17 with syncope felt to be 2/2 orthostatic hypotension. He was found to be in new onset AF with RVR. He has been asymptomatic. He was started on Metoprolol Tartrate 25 bid and Xarelto for anticoagulation. Is on amiodarone for rhythm control.  Today, denies symptoms of palpitations, chest pain, shortness of breath, orthopnea, PND, lower extremity edema, claudication, dizziness, presyncope, syncope, bleeding, or neurologic sequela. The patient is tolerating medications without difficulties.  Currently feeling well.  His only complaint is of back pain.  He is currently getting steroid shots.  Past Medical History:  Diagnosis Date  . Colon polyps   . History of chicken pox   . Hyperlipidemia   . Hypertension   . Internal hemorrhoids   . Irregular heartbeat   . Sciatica   . Stroke [facility]) 2008   Past Surgical History:  Procedure Laterality Date  . CARDIOVERSION N/A 09/02/2017   Procedure: CARDIOVERSION;  Surgeon: [name], MD;  Location: [facility] ENDOSCOPY;  Service: Cardiovascular;  Laterality: N/A;  . COLONOSCOPY  2011  . great toe surgery    . PROSTATE BIOPSY       Current Outpatient Medications  Medication Sig Dispense Refill  . amiodarone (PACERONE) 200 MG tablet Take 1 tablet (200 mg total) by mouth daily. 90 tablet 2  . apixaban (ELIQUIS) 5 MG TABS tablet Take 1 tablet (5 mg total) by mouth 2 (two) times daily. 60 tablet 6  . chlorthalidone (HYGROTON) 25 MG tablet TAKE 1 TABLET BY MOUTH ONCE DAILY 90 tablet 0  . cloNIDine (CATAPRES  - DOSED IN MG/24 HR) 0.2 mg/24hr patch Place 1 patch onto the skin once a week.  0  . cyclobenzaprine (FLEXERIL) 10 MG tablet Take 1 tablet by mouth as needed for muscle spasms.    Marland Kitchen doxazosin (CARDURA) 1 MG tablet Take 1 mg by mouth daily.    Marland Kitchen gabapentin (NEURONTIN) 100 MG capsule TAKE 1 CAPSULE BY MOUTH IN THE MORNING, 1 CAPSULE IN THE AFTERNOON AND 3 CAPSULES IN THE EVENING 150 capsule 3  . potassium chloride SA (K-DUR,KLOR-CON) 20 MEQ tablet Take 20 mEq by mouth 2 (two) times daily.    . psyllium (REGULOID) 0.52 g capsule Take by mouth.     No current facility-administered medications for this visit.     Allergies:   Crestor [rosuvastatin calcium]; Spironolactone; Terazosin; Atenolol; Bystolic [nebivolol hcl]; Hydralazine; Tizanidine; Amlodipine; Clonidine; Hydrocodone-acetaminophen; Levofloxacin; Losartan; Maxzide [hydrochlorothiazide w-triamterene]; Olmesartan; Tape; and Tussionex pennkinetic er [hydrocod polst-cpm polst er]   Social History:  The patient  reports that he has never smoked. He has never used smokeless tobacco. He reports that he does not drink alcohol or use drugs.   Family History:  The patient's family history includes Healthy in his brother and sister; Heart disease in his brother and father; Hypertension (age of onset: 16) in his father; Lung cancer (age of onset: 58) in his mother; Lupus in his daughter; Other in his daughter.   ROS:  Please see the history of present illness.  Otherwise, review of systems is positive for back pain.   All other systems are reviewed and negative.   PHYSICAL EXAM: VS:  BP 130/80   Pulse (!) 57   Ht 5\' 8"  (1.727 m)   Wt 207 lb (93.9 kg)   BMI 31.47 kg/m  , BMI Body mass index is 31.47 kg/m. GEN: Well nourished, well developed, in no acute distress  HEENT: normal  Neck: no JVD, carotid bruits, or masses Cardiac: RRR; no murmurs, rubs, or gallops,no edema  Respiratory:  clear to auscultation bilaterally, normal work of  breathing GI: soft, nontender, nondistended, + BS MS: no deformity or atrophy  Skin: warm and dry Neuro:  Strength and sensation are intact Psych: euthymic mood, full affect  EKG:  EKG is ordered today. Personal review of the ekg ordered shows a rhythm, right bundle branch block, left anterior fascicular block, first-degree AV block, rate 57  Recent Labs: 11/21/2017: Magnesium 1.9 08/11/2018: ALT 33; BUN 15; Creatinine, Ser 1.55; Potassium 3.7; Sodium 139; TSH 1.29 08/14/2018: Hemoglobin 16.9; Platelets 190.0    Lipid Panel     Component Value Date/Time   CHOL 248 (H) 08/11/2018 1001   TRIG 143.0 08/11/2018 1001   HDL 37.90 (L) 08/11/2018 1001   CHOLHDL 7 08/11/2018 1001   VLDL 28.6 08/11/2018 1001   LDLCALC 181 (H) 08/11/2018 1001     Wt Readings from Last 3 Encounters:  10/17/18 207 lb (93.9 kg)  10/10/18 208 lb (94.3 kg)  08/25/18 209 lb (94.8 kg)      Other studies Reviewed: Additional studies/ records that were reviewed today include: TTE 02/28/18 Review of the above records today demonstrates:  - Left ventricle: The cavity size was normal. Systolic function was   normal. The estimated ejection fraction was in the range of 55%   to 60%. Wall motion was normal; there were no regional wall   motion abnormalities. There was a reduced contribution of atrial   contraction to ventricular filling, due to increased ventricular   diastolic pressure or atrial contractile dysfunction. The study   is not technically sufficient to allow evaluation of LV diastolic   function. (suspect left atrial mechanical failure, possible   stunning after recent episode of atrial fibrillation versus   chronic atrial dysfunction) - Left atrium: The atrium was mildly dilated. - Right ventricle: The cavity size was mildly dilated. Wall   thickness was normal. - Right atrium: The atrium was mildly dilated. - Pulmonary arteries: Systolic pressure was moderately increased.   PA peak pressure: 60  mm Hg (S).  Myoview 02/24/18  Nuclear stress EF: 58%.  Normal perfusion. No ischemia  This is a low risk study.  ASSESSMENT AND PLAN:  1.  Paroxysmal atrial fibrillation/atrial flutter: Sinus rhythm today on amiodarone.  He has had no further episodes of atrial fibrillation.  Will check amiodarone labs today.  This patients CHA2DS2-VASc Score and unadjusted Ischemic Stroke Rate (% per year) is equal to 4.8 % stroke rate/year from a score of 4  Above score calculated as 1 point each if present [CHF, HTN, DM, Vascular=MI/PAD/Aortic Plaque, Age if 65-74, or Male] Above score calculated as 2 points each if present [Age > 75, or Stroke/TIA/TE]   2. Hypertension: Well-controlled today.  No changes.  Current medicines are reviewed at length with the patient today.   The patient does not have concerns regarding his medicines.  The following changes were made today: None  Labs/ tests ordered today include:  Orders Placed This Encounter  Procedures  . EKG 12-Lead     Disposition:   FU with Will Camnitz 6 months  Signed, [name], MD  10/17/2018 11:27 AM     [facility] [address] [place] [address] [phone] (office) [phone] (fax)

## 2018-10-20 ENCOUNTER — Other Ambulatory Visit: Payer: Self-pay | Admitting: Physician Assistant

## 2018-10-31 ENCOUNTER — Other Ambulatory Visit: Payer: Self-pay | Admitting: Cardiology

## 2018-10-31 MED ORDER — APIXABAN 5 MG PO TABS: 5.0000 mg | ORAL_TABLET | Freq: Two times a day (BID) | ORAL | 6 refills | Status: DC

## 2018-10-31 NOTE — Telephone Encounter (Signed)
New Message ° ° °Patient calling the office for samples of medication: ° ° °1.  What medication and dosage are you requesting samples for?apixaban (ELIQUIS) 5 MG TABS tablet  ° °2.  Are you currently out of this medication? Yes  ° ° ° °

## 2018-10-31 NOTE — Telephone Encounter (Signed)
Called pt to inform him that we have samples for new pts that is just starting this medication. I informed pt that I could leave 2 boxes of Eliquis 5 mg tablets at the front desk for pt to pick up, which is a 2 week supply. Pt verbalized understanding. Pt still needed a Rx for Eliquis sent to his pharmacy. Please address

## 2018-10-31 NOTE — Telephone Encounter (Signed)
Pt last saw Dr [name] 10/17/18, last labs 08/11/18 Creat 1.55, age 72, weight 93.9kg, based on specified criteria pt is on appropriate dosage of Eliquis 5mg  BID. Will refill rx.

## 2018-11-24 ENCOUNTER — Ambulatory Visit: Payer: Medicare Other | Admitting: Physician Assistant

## 2018-11-28 ENCOUNTER — Ambulatory Visit (INDEPENDENT_AMBULATORY_CARE_PROVIDER_SITE_OTHER): Payer: Medicare Other | Admitting: Physician Assistant

## 2018-11-28 ENCOUNTER — Encounter: Payer: Self-pay | Admitting: Physician Assistant

## 2018-11-28 ENCOUNTER — Other Ambulatory Visit: Payer: Self-pay

## 2018-11-28 VITALS — BP 150/90 | HR 50 | Temp 98.2°F | Resp 14 | Ht 68.0 in | Wt 208.0 lb

## 2018-11-28 DIAGNOSIS — I10 Essential (primary) hypertension: Secondary | ICD-10-CM | POA: Diagnosis not present

## 2018-11-28 DIAGNOSIS — N5089 Other specified disorders of the male genital organs: Secondary | ICD-10-CM | POA: Diagnosis not present

## 2018-11-28 LAB — BASIC METABOLIC PANEL
BUN: 18 mg/dL (ref 6–23)
CO2: 32 mEq/L (ref 19–32)
CREATININE: 1.4 mg/dL (ref 0.40–1.50)
Calcium: 9.4 mg/dL (ref 8.4–10.5)
Chloride: 101 mEq/L (ref 96–112)
GFR: 63.74 mL/min (ref >60.00)
Glucose, Bld: 86 mg/dL (ref 70–99)
Potassium: 3.7 mEq/L (ref 3.5–5.1)
SODIUM: 138 meq/L (ref 135–145)

## 2018-11-28 NOTE — Patient Instructions (Signed)
Please go to the lab today for blood work.  I will call you with your results. We will alter treatment regimen(s) if indicated by your results.   Take medications as directed. I am sending a message to Dr. [name] regarding your Amiodarone dose.  Please follow-up in 2 weeks for a BP check with the nurse after taking your medications at least 1 hour prior to visit.

## 2018-11-28 NOTE — Progress Notes (Signed)
Patient presents to clinic today for follow-up of hypertension. Is currently on a multi-drug regimen consisting of Chlorthalidone (Rxd by Cardiology for a. Fib), Amiodarone, Clonidine and Doxazosin. Endorses taking medications as directed with BP averaging in the 130/70s at home. Notes pulse is running in the 40s on occasion. Notes feeling very fatigued when this occurs. Denies lightheadedness, dizziness, SOB. Denies palpitations. Today, took his medication only 15 minutes before appointment.   Patient also endorses noting a small lump in the perineal region x 2 weeks. Is non painful or pruritic. Denies drainage from the area. States is not getting larger. Would like checked today.   Past Medical History:  Diagnosis Date  . Colon polyps   . History of chicken pox   . Hyperlipidemia   . Hypertension   . Internal hemorrhoids   . Irregular heartbeat   . Sciatica   . Stroke [facility]) 2008    Current Outpatient Medications on File Prior to Visit  Medication Sig Dispense Refill  . amiodarone (PACERONE) 200 MG tablet Take 1 tablet (200 mg total) by mouth daily. 90 tablet 2  . apixaban (ELIQUIS) 5 MG TABS tablet Take 1 tablet (5 mg total) by mouth 2 (two) times daily. 60 tablet 6  . chlorthalidone (HYGROTON) 25 MG tablet TAKE 1 TABLET BY MOUTH ONCE DAILY 90 tablet 0  . cloNIDine (CATAPRES - DOSED IN MG/24 HR) 0.2 mg/24hr patch Place 1 patch onto the skin once a week.  0  . doxazosin (CARDURA) 1 MG tablet Take 1 mg by mouth daily.    Marland Kitchen gabapentin (NEURONTIN) 100 MG capsule TAKE 1 CAPSULE BY MOUTH IN THE MORNING, 1 CAPSULE IN THE AFTERNOON AND 3 CAPSULES IN THE EVENING 150 capsule 3  . potassium chloride SA (K-DUR,KLOR-CON) 20 MEQ tablet TAKE 1 TABLET BY MOUTH IN THE MORNING AND 2 TABLETS IN THE EVENING 270 tablet 1  . psyllium (REGULOID) 0.52 g capsule Take by mouth.    . cyclobenzaprine (FLEXERIL) 10 MG tablet Take 1 tablet by mouth as needed for muscle spasms.     No current  facility-administered medications on file prior to visit.     Allergies  Allergen Reactions  . Crestor [Rosuvastatin Calcium] Shortness Of Breath    Muscle cramps  . Spironolactone Shortness Of Breath  . Terazosin Shortness Of Breath, Palpitations and Other (See Comments)    Nerve pain  . Atenolol Other (See Comments)    Drowsiness and "flu sxs"  . Bystolic [Nebivolol Hcl] Other (See Comments)    GI issues  . Hydralazine Other (See Comments)    Joint swelling  . Tizanidine Other (See Comments)    Syncope, Elevated BP/pulse  . Amlodipine Other (See Comments)  . Clonidine Other (See Comments)  . Hydrocodone-Acetaminophen Other (See Comments)  . Levofloxacin Other (See Comments)  . Losartan Other (See Comments)    Did not work.  Pt states he couldn't take b/c of SE, but can't remember what SE were.  . Maxzide [Hydrochlorothiazide W-Triamterene] Other (See Comments)    Does not tolerate potassium-sparing diuretics  . Olmesartan Other (See Comments)  . Tape Other (See Comments)    Other reaction(s): Unknown  . Tussionex Pennkinetic Er [Hydrocod Polst-Cpm Polst Er] Other (See Comments)    Other reaction(s): Unknown    Family History  Problem Relation Age of Onset  . Hypertension Father 82       Deceased  . Heart disease Father   . Lung cancer Mother 95  Deceased  . Healthy Sister        x3  . Healthy Brother        x4  . Heart disease Brother        #5  . Other Daughter        Alpha Thalassemia  . Lupus Daughter        #2-deceased  . Diabetes Neg Hx   . Heart attack Neg Hx   . Hyperlipidemia Neg Hx   . Sudden death Neg Hx     Social History   Socioeconomic History  . Marital status: Married    Spouse name: Not on file  . Number of children: 1  . Years of education: 59  . Highest education level: Not on file  Occupational History  . Occupation: Retired  Scientific laboratory technician  . Financial resource strain: Not on file  . Food insecurity:    Worry: Not on file      Inability: Not on file  . Transportation needs:    Medical: Not on file    Non-medical: Not on file  Tobacco Use  . Smoking status: Never Smoker  . Smokeless tobacco: Never Used  Substance and Sexual Activity  . Alcohol use: No  . Drug use: No  . Sexual activity: Not on file  Lifestyle  . Physical activity:    Days per week: Not on file    Minutes per session: Not on file  . Stress: Not on file  Relationships  . Social connections:    Talks on phone: Not on file    Gets together: Not on file    Attends religious service: Not on file    Active member of club or organization: Not on file    Attends meetings of clubs or organizations: Not on file    Relationship status: Not on file  Other Topics Concern  . Not on file  Social History Narrative   Fun: Play golf and build furniture.    Review of Systems - See HPI.  All other ROS are negative.  BP (!) 150/90   Pulse (!) 50   Temp 98.2 F (36.8 C) (Oral)   Resp 14   Ht 5\' 8"  (1.727 m)   Wt 208 lb (94.3 kg)   SpO2 98%   BMI 31.63 kg/m   Physical Exam Vitals signs reviewed. Exam conducted with a chaperone present.  Constitutional:      Appearance: Normal appearance.  HENT:     Head: Normocephalic and atraumatic.     Right Ear: Tympanic membrane normal.     Left Ear: Tympanic membrane normal.  Neck:     Musculoskeletal: Neck supple.  Cardiovascular:     Rate and Rhythm: Regular rhythm. Bradycardia present.     Pulses: Normal pulses.     Heart sounds: Normal heart sounds.  Pulmonary:     Effort: Pulmonary effort is normal.     Breath sounds: Normal breath sounds.  Genitourinary:   Neurological:     General: No focal deficit present.     Mental Status: He is alert and oriented to person, place, and time.  Psychiatric:        Mood and Affect: Mood normal.    Assessment/Plan: 1. Essential hypertension BP above goal today. Is improved on recheck. Patient only took medications just before visit so have not  taken effect yet. Home BP measurements within normal limits. Will have him continue current regimen, follow DASH diet. Return in 2 weeks haven  taken medication much sooner for nurse to recheck BP. Will contact Dr. [name] regarding heart rate at home and amiodarone dosing.  - Basic metabolic panel  2. Cyst of perineum in male Small SQ lesion noted. No tenderness or drainage. Reassurance given. Return precautions discussed.    Leeanne Rio, PA-C

## 2018-12-08 DIAGNOSIS — I4891 Unspecified atrial fibrillation: Secondary | ICD-10-CM | POA: Diagnosis not present

## 2018-12-08 DIAGNOSIS — I129 Hypertensive chronic kidney disease with stage 1 through stage 4 chronic kidney disease, or unspecified chronic kidney disease: Secondary | ICD-10-CM | POA: Diagnosis not present

## 2018-12-08 DIAGNOSIS — N183 Chronic kidney disease, stage 3 (moderate): Secondary | ICD-10-CM | POA: Diagnosis not present

## 2018-12-11 ENCOUNTER — Ambulatory Visit (INDEPENDENT_AMBULATORY_CARE_PROVIDER_SITE_OTHER): Payer: Medicare Other | Admitting: Physician Assistant

## 2018-12-11 VITALS — BP 132/82 | HR 57

## 2018-12-11 DIAGNOSIS — Z0130 Encounter for examination of blood pressure without abnormal findings: Secondary | ICD-10-CM

## 2018-12-11 DIAGNOSIS — I10 Essential (primary) hypertension: Secondary | ICD-10-CM | POA: Diagnosis not present

## 2018-12-11 NOTE — Progress Notes (Signed)
BP and heart rate stable today. Continue current regimen.

## 2018-12-11 NOTE — Progress Notes (Signed)
Pt came in for repeat BP reading. PCP was concerned of low pulse rate. BP reading today was 132/82 and pulse was 57.

## 2018-12-19 ENCOUNTER — Other Ambulatory Visit: Payer: Self-pay | Admitting: Physician Assistant

## 2018-12-21 ENCOUNTER — Other Ambulatory Visit: Payer: Self-pay | Admitting: Physician Assistant

## 2018-12-25 ENCOUNTER — Telehealth: Payer: Self-pay | Admitting: Physician Assistant

## 2018-12-25 NOTE — Telephone Encounter (Signed)
The area was consistent with a cyst that we discussed would likely resolve or stay the same. If the area has resolved itself, there is no further concern unless it reoccurs or new symptoms develop.

## 2018-12-25 NOTE — Telephone Encounter (Signed)
Attempted to call patient X's 2.  Number was busy - will try again.

## 2018-12-25 NOTE — Telephone Encounter (Signed)
Attempted to call patient again  - still busy.

## 2018-12-25 NOTE — Telephone Encounter (Signed)
Copied from [place]. Topic: Quick Communication - See Telephone Encounter >> Dec 25, 2018  8:47 AM Blase Mess A wrote: CRM for notification. See Telephone encounter for: 12/25/18.  Patient is calling because the lump in his grown area is gone.  Patient is wanting to know is there any thing that he sound be considered about.  Please advise [phone]

## 2018-12-25 NOTE — Telephone Encounter (Signed)
Attempted to contact patient.  Number is still busy and I am not able to get through.

## 2018-12-26 NOTE — Telephone Encounter (Signed)
Spoke with patient regarding concerns. Gave advice from PCP.  Patient stated verbal understanding.

## 2018-12-29 ENCOUNTER — Other Ambulatory Visit: Payer: Self-pay | Admitting: Physician Assistant

## 2018-12-29 DIAGNOSIS — G629 Polyneuropathy, unspecified: Secondary | ICD-10-CM

## 2019-03-12 ENCOUNTER — Other Ambulatory Visit: Payer: Self-pay | Admitting: Physician Assistant

## 2019-04-04 ENCOUNTER — Telehealth: Payer: Self-pay | Admitting: *Deleted

## 2019-04-04 DIAGNOSIS — I639 Cerebral infarction, unspecified: Secondary | ICD-10-CM | POA: Diagnosis not present

## 2019-04-04 DIAGNOSIS — N183 Chronic kidney disease, stage 3 (moderate): Secondary | ICD-10-CM | POA: Diagnosis not present

## 2019-04-04 DIAGNOSIS — I4891 Unspecified atrial fibrillation: Secondary | ICD-10-CM | POA: Diagnosis not present

## 2019-04-04 DIAGNOSIS — I129 Hypertensive chronic kidney disease with stage 1 through stage 4 chronic kidney disease, or unspecified chronic kidney disease: Secondary | ICD-10-CM | POA: Diagnosis not present

## 2019-04-04 NOTE — Telephone Encounter (Signed)
Calling patient today to discuss upcoming appointment.  We are currently trying to limit exposure to the virus that causes COVID-19 by seeing patients at home rather than in the office. We would like to schedule this appointment as a Psychologist, counselling. Unable to reach patient.  Phone rings then hangs up

## 2019-04-07 IMAGING — CR DG CHEST 2V
2 series · 2 of 2 positions shown · non-contrast
Comparison: 04/22/2017

CLINICAL DATA: Left side chest pain and shortness of breath (only
on the left side) for the past few days, worsening with chills, left
arm pain and back pain today. Hx htn

EXAM:
CHEST  2 VIEW

[chest pa]
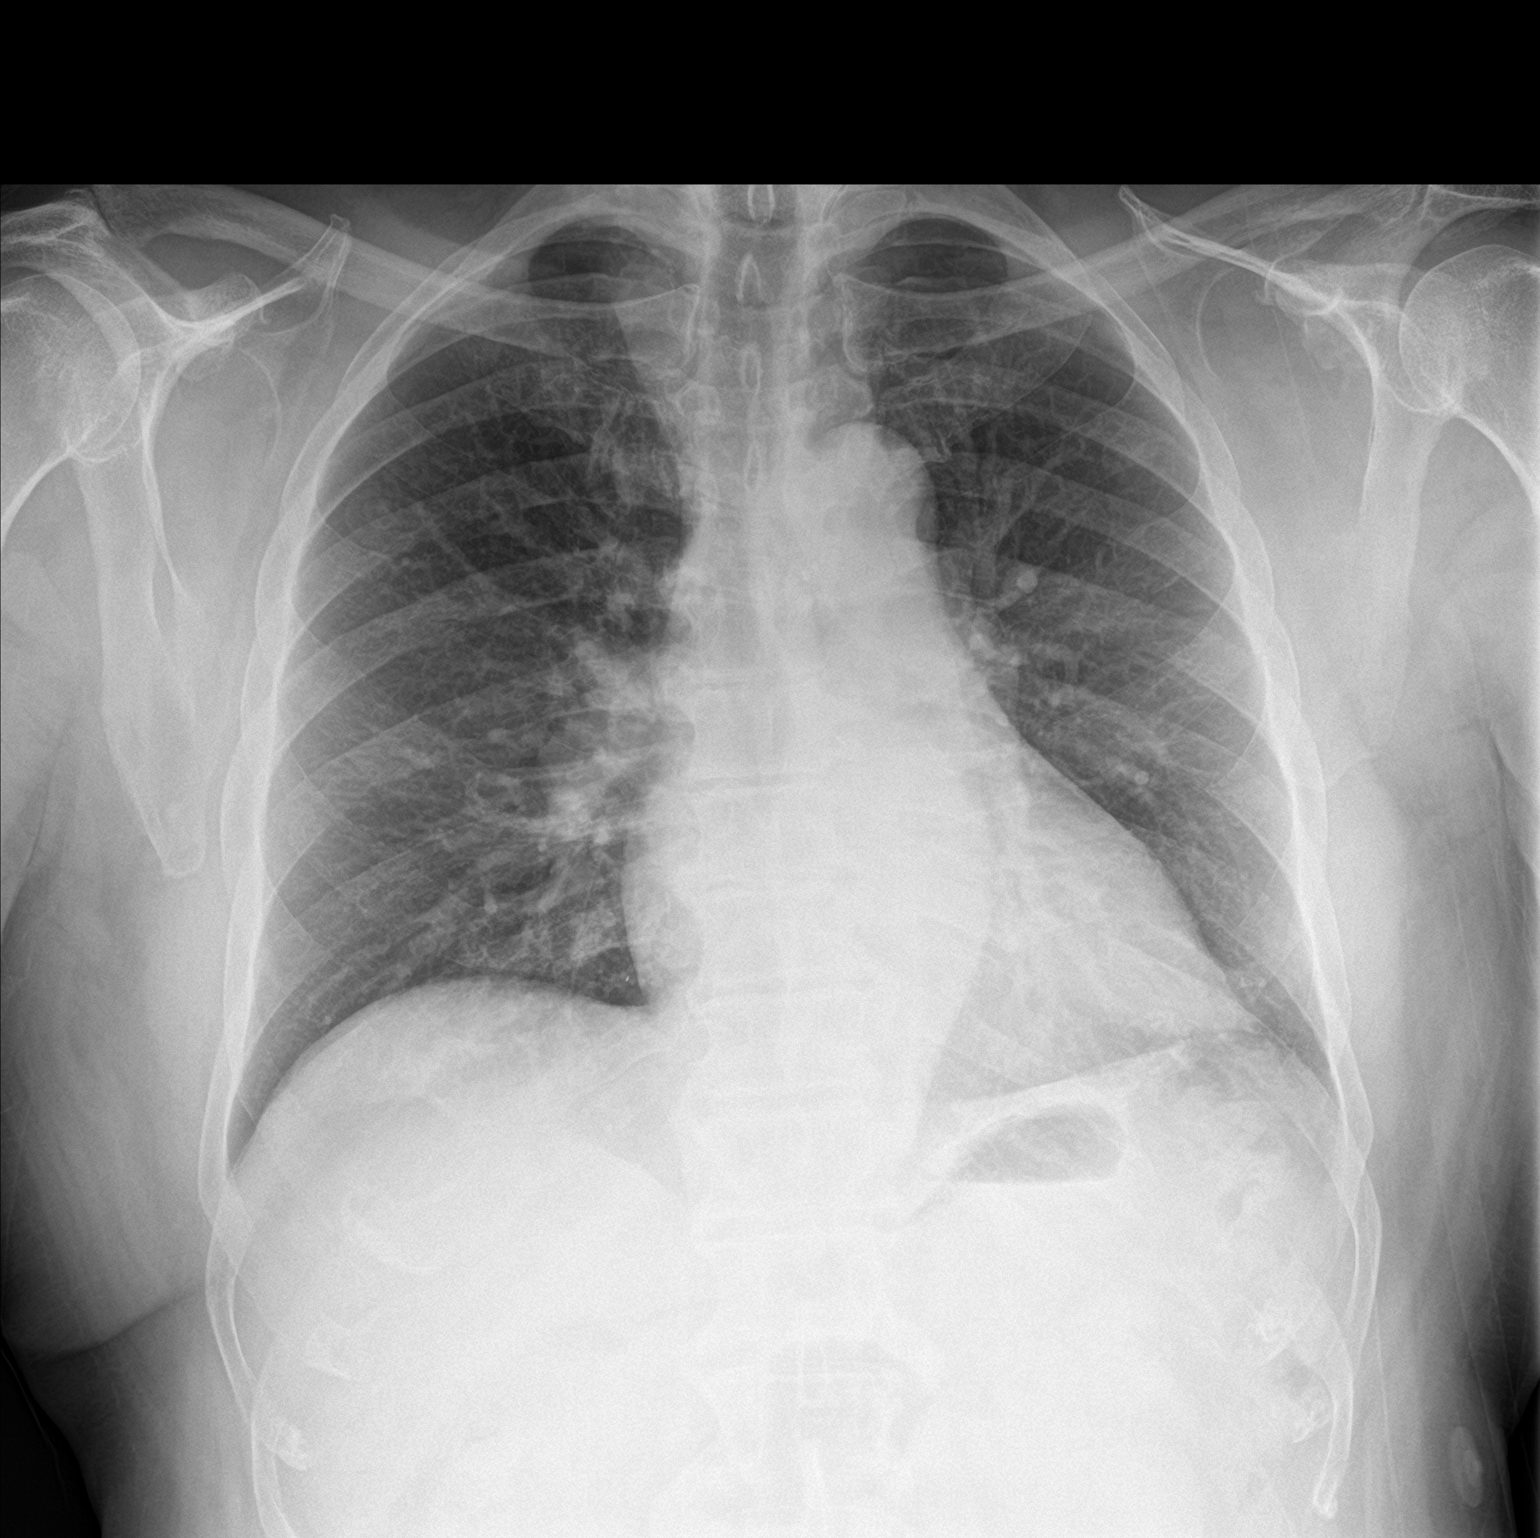

[chest lat]
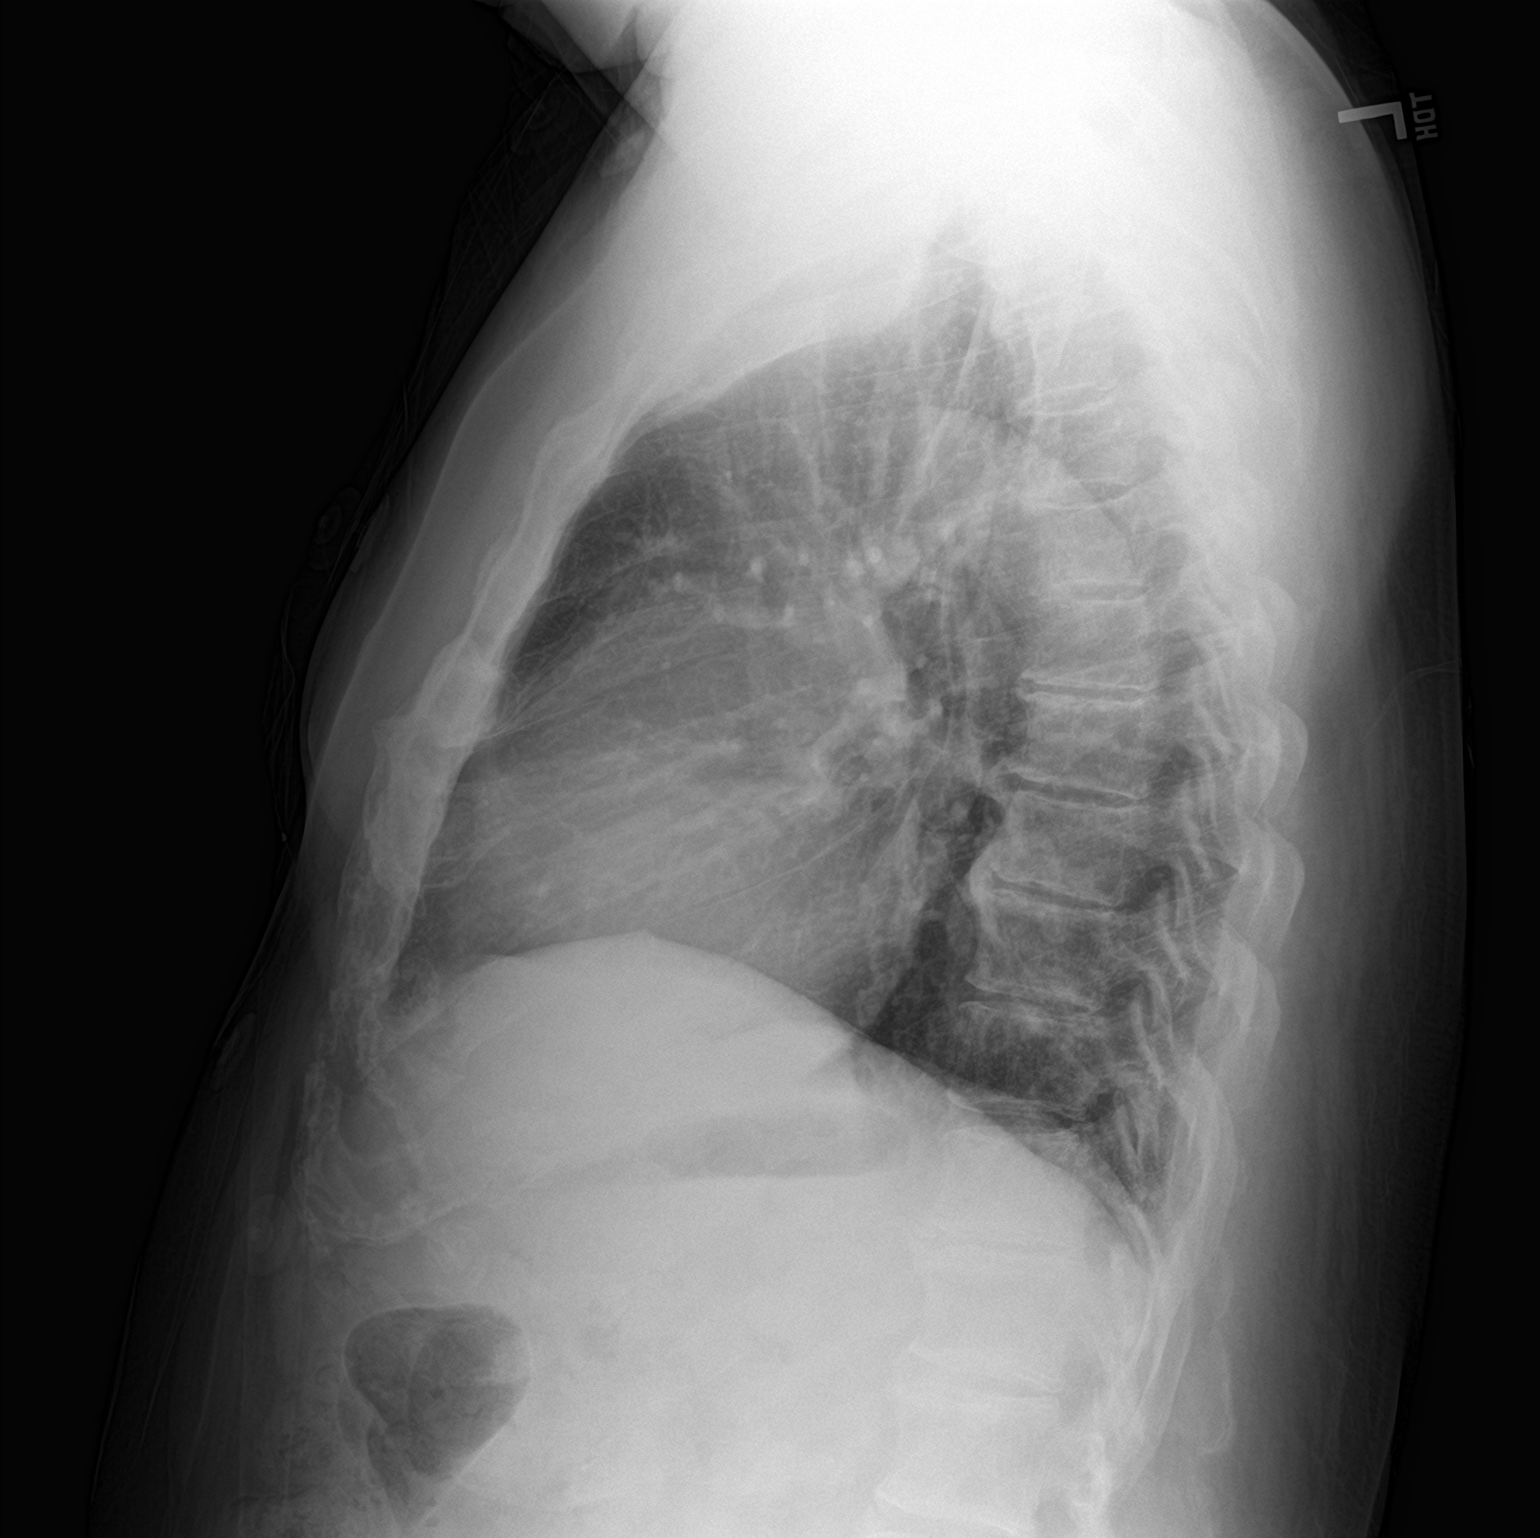

[2 of 2 positions shown; findings below may reference images not displayed]

FINDINGS: Moderate thoracic spondylosis. Midline trachea. Borderline
cardiomegaly. Tortuous thoracic aorta. No pleural effusion or
pneumothorax. Clear lungs.
IMPRESSION: Borderline cardiomegaly, without acute disease.

## 2019-04-09 NOTE — Telephone Encounter (Signed)
Calling patient today to discuss upcoming appointment.  We are currently trying to limit exposure to the virus that causes COVID-19 by seeing patients at home rather than in the office. We would like to schedule this appointment as a Psychologist, counselling. Unable to reach patient.  NO VM SET UP. PHONE RINGS THEN HANGS UP

## 2019-04-10 ENCOUNTER — Ambulatory Visit: Payer: Medicare Other | Admitting: Cardiology

## 2019-04-10 ENCOUNTER — Telehealth: Payer: Self-pay

## 2019-04-10 ENCOUNTER — Other Ambulatory Visit: Payer: Self-pay | Admitting: Nurse Practitioner

## 2019-04-10 ENCOUNTER — Telehealth (INDEPENDENT_AMBULATORY_CARE_PROVIDER_SITE_OTHER): Payer: Medicare Other | Admitting: Cardiology

## 2019-04-10 ENCOUNTER — Other Ambulatory Visit: Payer: Self-pay

## 2019-04-10 VITALS — BP 135/75 | HR 55

## 2019-04-10 DIAGNOSIS — I4819 Other persistent atrial fibrillation: Secondary | ICD-10-CM

## 2019-04-10 DIAGNOSIS — I10 Essential (primary) hypertension: Secondary | ICD-10-CM

## 2019-04-10 DIAGNOSIS — I483 Typical atrial flutter: Secondary | ICD-10-CM

## 2019-04-10 DIAGNOSIS — Z79899 Other long term (current) drug therapy: Secondary | ICD-10-CM

## 2019-04-10 NOTE — Patient Instructions (Signed)
Medication Instructions:  Your physician recommends that you continue on your current medications as directed. Please refer to the Current Medication list given to you today.  If you need a refill on your cardiac medications before your next appointment, please call your pharmacy.   Lab work: TSH, hepatic... can have with your nephrologist if not at Dr. [name] office end of July 2020 then can add fasting Lipid panel    If you have labs (blood work) drawn today and your tests are completely normal, you will receive your results only by: Marland Kitchen MyChart Message (if you have MyChart) OR . A paper copy in the mail If you have any lab test that is abnormal or we need to change your treatment, we will call you to review the results.  Testing/Procedures: None ordered  Follow-Up:Your physician wants you to follow-up in: 6 months. You will receive a reminder letter in the mail two months in advance. If you don't receive a letter, please call our office to schedule the follow-up appointment.  At [facility], you and your health needs are our priority.  As part of our continuing mission to provide you with exceptional heart care, we have created designated Provider Care Teams.  These Care Teams include your primary Cardiologist (physician) and Advanced Practice Providers (APPs -  Physician Assistants and Nurse Practitioners) who all work together to provide you with the care you need, when you need it. You will need a follow up appointment in 6 months.  Please call our office 2 months in advance to schedule this appointment.  You may see one of the following Advanced Practice Providers on your designated Care Team:   Chanetta Marshall, NP . Tommye Standard, PA-C  Any Other Special Instructions Will Be Listed Below (If Applicable).

## 2019-04-10 NOTE — Telephone Encounter (Signed)
Pt consents to phone visit today with Dr. [name].      Virtual Visit Pre-Appointment Phone Call  "(Name), I am calling you today to discuss your upcoming appointment. We are currently trying to limit exposure to the virus that causes COVID-19 by seeing patients at home rather than in the office."  1. "What is the BEST phone number to call the day of the visit?" - include this in appointment notes  2. "Do you have or have access to (through a family member/friend) a smartphone with video capability that we can use for your visit?" a. If yes - list this number in appt notes as "cell" (if different from BEST phone #) and list the appointment type as a VIDEO visit in appointment notes b. If no - list the appointment type as a PHONE visit in appointment notes  3. Confirm consent - "In the setting of the current Covid19 crisis, you are scheduled for a (phone or video) visit with your provider on (date) at (time).  Just as we do with many in-office visits, in order for you to participate in this visit, we must obtain consent.  If you'd like, I can send this to your mychart (if signed up) or email for you to review.  Otherwise, I can obtain your verbal consent now.  All virtual visits are billed to your insurance company just like a normal visit would be.  By agreeing to a virtual visit, we'd like you to understand that the technology does not allow for your provider to perform an examination, and thus may limit your provider's ability to fully assess your condition. If your provider identifies any concerns that need to be evaluated in person, we will make arrangements to do so.  Finally, though the technology is pretty good, we cannot assure that it will always work on either your or our end, and in the setting of a video visit, we may have to convert it to a phone-only visit.  In either situation, we cannot ensure that we have a secure connection.  Are you willing to proceed?" STAFF: Did the patient  verbally acknowledge consent to telehealth visit? Document YES/NO here: YES  4. Advise patient to be prepared - "Two hours prior to your appointment, go ahead and check your blood pressure, pulse, oxygen saturation, and your weight (if you have the equipment to check those) and write them all down. When your visit starts, your provider will ask you for this information. If you have an Apple Watch or Kardia device, please plan to have heart rate information ready on the day of your appointment. Please have a pen and paper handy nearby the day of the visit as well."  5. Give patient instructions for MyChart download to smartphone OR Doximity/Doxy.me as below if video visit (depending on what platform provider is using)  6. Inform patient they will receive a phone call 15 minutes prior to their appointment time (may be from unknown caller ID) so they should be prepared to answer    TELEPHONE CALL NOTE  Patrick Roth has been deemed a candidate for a follow-up tele-health visit to limit community exposure during the Covid-19 pandemic. I spoke with the patient via phone to ensure availability of phone/video source, confirm preferred email & phone number, and discuss instructions and expectations.  I reminded Patrick Roth to be prepared with any vital sign and/or heart rhythm information that could potentially be obtained via home monitoring, at the time of his visit.  I reminded Patrick Roth to expect a phone call prior to his visit.  Stephani Police, RN 04/10/2019 8:39 AM   INSTRUCTIONS FOR DOWNLOADING THE MYCHART APP TO SMARTPHONE  - The patient must first make sure to have activated MyChart and know their login information - If Apple, go to [employer] and type in MyChart in the search bar and download the app. If Android, ask patient to go to [employer] and type in [place] in the search bar and download the app. The app is free but as with any other app downloads, their phone may require them  to verify saved payment information or Apple/Android password.  - The patient will need to then log into the app with their MyChart username and password, and select [facility] as their healthcare provider to link the account. When it is time for your visit, go to the MyChart app, find appointments, and click Begin Video Visit. Be sure to Select Allow for your device to access the Microphone and Camera for your visit. You will then be connected, and your provider will be with you shortly.  **If they have any issues connecting, or need assistance please contact MyChart service desk [phone])**  **If using a computer, in order to ensure the best quality for their visit they will need to use either of the following Internet Browsers: [employer], or Google Chrome**  IF USING DOXIMITY or DOXY.ME - The patient will receive a link just prior to their visit by text.     FULL LENGTH CONSENT FOR TELE-HEALTH VISIT   I hereby voluntarily request, consent and authorize [facility] and its employed or contracted physicians, physician assistants, nurse practitioners or other licensed health care professionals (the Practitioner), to provide me with telemedicine health care services (the "Services") as deemed necessary by the treating Practitioner. I acknowledge and consent to receive the Services by the Practitioner via telemedicine. I understand that the telemedicine visit will involve communicating with the Practitioner through live audiovisual communication technology and the disclosure of certain medical information by electronic transmission. I acknowledge that I have been given the opportunity to request an in-person assessment or other available alternative prior to the telemedicine visit and am voluntarily participating in the telemedicine visit.  I understand that I have the right to withhold or withdraw my consent to the use of telemedicine in the course of my care at any time,  without affecting my right to future care or treatment, and that the Practitioner or I may terminate the telemedicine visit at any time. I understand that I have the right to inspect all information obtained and/or recorded in the course of the telemedicine visit and may receive copies of available information for a reasonable fee.  I understand that some of the potential risks of receiving the Services via telemedicine include:  Marland Kitchen Delay or interruption in medical evaluation due to technological equipment failure or disruption; . Information transmitted may not be sufficient (e.g. poor resolution of images) to allow for appropriate medical decision making by the Practitioner; and/or  . In rare instances, security protocols could fail, causing a breach of personal health information.  Furthermore, I acknowledge that it is my responsibility to provide information about my medical history, conditions and care that is complete and accurate to the best of my ability. I acknowledge that Practitioner's advice, recommendations, and/or decision may be based on factors not within their control, such as incomplete or inaccurate data provided by me or  distortions of diagnostic images or specimens that may result from electronic transmissions. I understand that the practice of medicine is not an exact science and that Practitioner makes no warranties or guarantees regarding treatment outcomes. I acknowledge that I will receive a copy of this consent concurrently upon execution via email to the email address I last provided but may also request a printed copy by calling the office of [facility].    I understand that my insurance will be billed for this visit.   I have read or had this consent read to me. . I understand the contents of this consent, which adequately explains the benefits and risks of the Services being provided via telemedicine.  . I have been provided ample opportunity to ask questions regarding  this consent and the Services and have had my questions answered to my satisfaction. . I give my informed consent for the services to be provided through the use of telemedicine in my medical care  By participating in this telemedicine visit I agree to the above.

## 2019-04-10 NOTE — Telephone Encounter (Signed)
Left message for pt.. he had phone visit with Dr. [name] today and pt needs TSH and Hepatic panel that he mentioned to Dr. [name] he could have with his Nephrologist next week and if not will have here late summer and will then add on fasting lipid panel.. LM for pt to call back so we can set all of that up and if I need to send orders.

## 2019-04-10 NOTE — Telephone Encounter (Signed)
Pt called back and he will let us know if his nephrologist plans to draw labs and if he does he will draw the Hepatic and TSH for [place]. Pt to call us back.

## 2019-04-10 NOTE — Addendum Note (Signed)
Addended by: Stephani Police on: 04/10/2019 10:42 AM   Modules accepted: Orders

## 2019-04-10 NOTE — Progress Notes (Signed)
Electrophysiology TeleHealth Note   Due to national recommendations of social distancing due to COVID 19, an audio/video telehealth visit is felt to be most appropriate for this patient at this time.  See Epic message for the patient's consent to telehealth for [facility].   Date:  04/10/2019   ID:  Patrick Roth, DOB 25-Jul-1946, MRN [id number]  Location: patient's home  Provider location: [address] [place]  Evaluation Performed: Follow-up visit  PCP:  Patrick Jeans, PA-C  Cardiologist:  Patrick Hobin Meredith Leeds, MD ` Electrophysiologist:  Dr Patrick Roth  Chief Complaint:  AF  History of Present Illness:    Patrick Roth is a 73 y.o. male who presents via audio/video conferencing for a telehealth visit today.  Since last being seen in our clinic, the patient reports doing very well.  Today, he denies symptoms of palpitations, chest pain, shortness of breath,  lower extremity edema, dizziness, presyncope, or syncope.  The patient is otherwise without complaint today.  The patient denies symptoms of fevers, chills, cough, or new SOB worrisome for COVID 19.  He has a history significant for hypertension, hyperlipidemia, CVA, and atrial fibrillation.  Today, denies symptoms of palpitations, chest pain, shortness of breath, orthopnea, PND, lower extremity edema, claudication, dizziness, presyncope, syncope, bleeding, or neurologic sequela. The patient is tolerating medications without difficulties.  Overall he is doing well.  He has no chest pain or shortness of breath.  He is able to do all of his daily activities.  His nephrologist has been adjusting his blood pressure medications.  He has follow-up with them next week.  Past Medical History:  Diagnosis Date  . Colon polyps   . History of chicken pox   . Hyperlipidemia   . Hypertension   . Internal hemorrhoids   . Irregular heartbeat   . Sciatica   . Stroke [facility]) 2008    Past Surgical History:  Procedure  Laterality Date  . CARDIOVERSION N/A 09/02/2017   Procedure: CARDIOVERSION;  Surgeon: Patrick Casino, MD;  Location: [facility] ENDOSCOPY;  Service: Cardiovascular;  Laterality: N/A;  . COLONOSCOPY  2011  . great toe surgery    . PROSTATE BIOPSY      Current Outpatient Medications  Medication Sig Dispense Refill  . amiodarone (PACERONE) 200 MG tablet Take 1 tablet (200 mg total) by mouth daily. 90 tablet 2  . amLODipine (NORVASC) 10 MG tablet Take 10 mg by mouth daily.    Marland Kitchen apixaban (ELIQUIS) 5 MG TABS tablet Take 1 tablet (5 mg total) by mouth 2 (two) times daily. 60 tablet 6  . chlorthalidone (HYGROTON) 25 MG tablet Take 1 tablet by mouth once daily 90 tablet 0  . cloNIDine (CATAPRES - DOSED IN MG/24 HR) 0.2 mg/24hr patch Place 1 patch onto the skin once a week.  0  . cyclobenzaprine (FLEXERIL) 10 MG tablet Take 1 tablet by mouth as needed for muscle spasms.    Marland Kitchen doxazosin (CARDURA) 1 MG tablet Take 1 mg by mouth daily.    Marland Kitchen gabapentin (NEURONTIN) 100 MG capsule TAKE 1 CAPSULE BY MOUTH IN THE MORNING, 1 CAPSULE IN THE AFTERNOON AND 3 CAPSULES IN THE EVENING 150 capsule 3  . potassium chloride SA (K-DUR,KLOR-CON) 20 MEQ tablet TAKE 1 TABLET BY MOUTH IN THE MORNING AND 2 TABLETS IN THE EVENING 270 tablet 1  . psyllium (REGULOID) 0.52 g capsule Take by mouth.     No current facility-administered medications for this visit.  Allergies:   Crestor [rosuvastatin calcium]; Spironolactone; Terazosin; Atenolol; Bystolic [nebivolol hcl]; Hydralazine; Tizanidine; Amlodipine; Clonidine; Hydrocodone-acetaminophen; Levofloxacin; Losartan; Maxzide [hydrochlorothiazide w-triamterene]; Olmesartan; Tape; and Tussionex pennkinetic er [hydrocod polst-cpm polst er]   Social History:  The patient  reports that he has never smoked. He has never used smokeless tobacco. He reports that he does not drink alcohol or use drugs.   Family History:  The patient's  family history includes Healthy in his brother and  sister; Heart disease in his brother and father; Hypertension (age of onset: 3) in his father; Lung cancer (age of onset: 74) in his mother; Lupus in his daughter; Other in his daughter.   ROS:  Please see the history of present illness.   All other systems are personally reviewed and negative.    Exam:    Vital Signs:  BP 135/75   Pulse (!) 55   Over the phone, no acute distress, no shortness of breath.  Labs/Other Tests and Data Reviewed:    Recent Labs: 08/11/2018: ALT 33; TSH 1.29 08/14/2018: Hemoglobin 16.9; Platelets 190.0 11/28/2018: BUN 18; Creatinine, Ser 1.40; Potassium 3.7; Sodium 138   Wt Readings from Last 3 Encounters:  11/28/18 208 lb (94.3 kg)  10/17/18 207 lb (93.9 kg)  10/10/18 208 lb (94.3 kg)     Other studies personally reviewed: Additional studies/ records that were reviewed today include: ECG 10/18/2018 personally reviewed Review of the above records today demonstrates:   Sinus rhythm, right bundle branch block, first-degree AV block   ASSESSMENT & PLAN:    1.  Paroxysmal atrial fibrillation/flutter: Currently on amiodarone.  Anticoagulated with Eliquis. No further atrial fibrillation.  He Patrick Roth need a TSH and a hepatic panel drawn.  He is going to his kidney doctors next week and may be able to get it drawn there.  If he does not have it drawn with his kidney doctor, we Patrick Roth plan to draw it in July.  We Patrick Roth also get a fasting lipid panel at that time.  This patients CHA2DS2-VASc Score and unadjusted Ischemic Stroke Rate (% per year) is equal to 4.8 % stroke rate/year from a score of 4  Above score calculated as 1 point each if present [CHF, HTN, DM, Vascular=MI/PAD/Aortic Plaque, Age if 65-74, or Male] Above score calculated as 2 points each if present [Age > 75, or Stroke/TIA/TE]  2.  Hypertension: Well-controlled.  Adjustments per nephrology.   COVID 19 screen The patient denies symptoms of COVID 19 at this time.  The importance of social  distancing was discussed today.  Follow-up: 6 months  Current medicines are reviewed at length with the patient today.   The patient does not have concerns regarding his medicines.  The following changes were made today:  none  Labs/ tests ordered today include:  No orders of the defined types were placed in this encounter.    Patient Risk:  after full review of this patients clinical status, I feel that they are at moderate risk at this time.  Today, I have spent 11 minutes with the patient with telehealth technology discussing atrial fibrillation.    Signed, Yancarlos Berthold Meredith Leeds, MD  04/10/2019 10:32 AM     [facility] [address] [place] [address] [phone] (office) [phone] (fax)

## 2019-04-20 ENCOUNTER — Other Ambulatory Visit: Payer: Self-pay

## 2019-04-20 ENCOUNTER — Other Ambulatory Visit: Payer: Medicare Other | Admitting: *Deleted

## 2019-04-20 DIAGNOSIS — Z79899 Other long term (current) drug therapy: Secondary | ICD-10-CM

## 2019-04-20 DIAGNOSIS — I4819 Other persistent atrial fibrillation: Secondary | ICD-10-CM

## 2019-04-20 DIAGNOSIS — I10 Essential (primary) hypertension: Secondary | ICD-10-CM

## 2019-04-20 DIAGNOSIS — I483 Typical atrial flutter: Secondary | ICD-10-CM | POA: Diagnosis not present

## 2019-04-20 LAB — LIPID PANEL
Chol/HDL Ratio: 5.8 ratio — ABNORMAL HIGH (ref 0.0–5.0)
Cholesterol, Total: 263 mg/dL — ABNORMAL HIGH (ref 100–199)
HDL: 45 mg/dL (ref >39)
LDL Calculated: 197 mg/dL — ABNORMAL HIGH (ref 0–99)
Triglycerides: 107 mg/dL (ref 0–149)
VLDL Cholesterol Cal: 21 mg/dL (ref 5–40)

## 2019-04-20 LAB — HEPATIC FUNCTION PANEL
ALT: 31 IU/L (ref 0–44)
AST: 32 IU/L (ref 0–40)
Albumin: 4.5 g/dL (ref 3.7–4.7)
Alkaline Phosphatase: 79 IU/L (ref 39–117)
Bilirubin Total: 1.7 mg/dL — ABNORMAL HIGH (ref 0.0–1.2)
Bilirubin, Direct: 0.37 mg/dL (ref 0.00–0.40)
Total Protein: 7.2 g/dL (ref 6.0–8.5)

## 2019-04-20 LAB — TSH: TSH: 2.4 u[IU]/mL (ref 0.450–4.500)

## 2019-04-25 ENCOUNTER — Telehealth: Payer: Self-pay | Admitting: *Deleted

## 2019-04-25 DIAGNOSIS — R001 Bradycardia, unspecified: Secondary | ICD-10-CM

## 2019-04-25 NOTE — Telephone Encounter (Signed)
Pt willing to start crestor 40 mg. This medication is on his allergy list but pt states he may not be allergic because he was taking this during his time of undiagnosed Afib. Pt states his SOB may have been from the afib not the medication Crestor.  Pt was told that I will redirect the message back to Dr/RN to advise further. Please call pt with medication advice and lab follow up.

## 2019-04-25 NOTE — Telephone Encounter (Signed)
See phone note

## 2019-04-25 NOTE — Telephone Encounter (Signed)
-----   Message from [name], MD sent at 04/24/2019 12:05 PM EDT ----- LDL significantly elevated, start crestor 40 mg

## 2019-04-25 NOTE — Telephone Encounter (Signed)
Patient called back returning a call from our office °

## 2019-04-25 NOTE — Telephone Encounter (Signed)
LMTCB/ number provided.

## 2019-04-27 NOTE — Telephone Encounter (Signed)
Follow up   Patient states that the crestor may cause his pulse to lowered and b/p may become low also. The patient states that these could be side effects of this medication when he starts taking this medication. The patient would like a call to discuss.

## 2019-04-27 NOTE — Telephone Encounter (Signed)
Left message that I would call him next week to discuss monitor, per Camnitz.

## 2019-04-27 NOTE — Telephone Encounter (Signed)
Pt states that his "kidney doctor was trying to give him something to help his HR go up.  It was Amlodipine and I don't think it is working b/c my HRs are dropping as low as 46-49. I don't feel like doing anything when my HR is that low". Informed pt that Amlodipine was for his BP and did not control his HR.  Reports SBP 124, informed this was normal. Pt then dicussed low HRs.  He is obtaining these readings via BP monitor.  Pt has hx of persistent afib and taking Amiodarone/Eliquis. Pt c/o fatigue, denies dizziness/light-headedness/syncope, SOB or swelling.  Will forward to Dr. [name] for advisement of possible monitor to determine HR/AFib.

## 2019-05-04 NOTE — Telephone Encounter (Signed)
lmtcb to discuss

## 2019-05-07 NOTE — Telephone Encounter (Signed)
Patient is returning your call.  

## 2019-05-11 NOTE — Addendum Note (Signed)
Addended by: Stanton Kidney on: 05/11/2019 10:41 AM   Modules accepted: Orders

## 2019-05-11 NOTE — Telephone Encounter (Signed)
Advised we will order a week monitor to determine HRs/symptoms. Pt understands office will contact him to arrange and it will be mailed to home address. Informed that I would follow up once monitor is completed and rv by MD. Advised to call if issues occur before follow up post monitor. Patient verbalized understanding and agreeable to plan.

## 2019-05-14 DIAGNOSIS — Z20828 Contact with and (suspected) exposure to other viral communicable diseases: Secondary | ICD-10-CM | POA: Diagnosis not present

## 2019-05-15 ENCOUNTER — Telehealth: Payer: Self-pay | Admitting: Radiology

## 2019-05-15 NOTE — Telephone Encounter (Signed)
Tried calling to verify address/insurance to have monitor mailed and need to briefly go over monitor instructions

## 2019-05-16 NOTE — Telephone Encounter (Signed)
Enrolled patient for a 7 day Zio monitor to be mailed. Brief instructions were gone over with the patient and he knows to expect the monitor to arrive in 3-4 days

## 2019-05-23 ENCOUNTER — Ambulatory Visit (INDEPENDENT_AMBULATORY_CARE_PROVIDER_SITE_OTHER): Payer: Medicare Other

## 2019-05-23 DIAGNOSIS — R001 Bradycardia, unspecified: Secondary | ICD-10-CM | POA: Diagnosis not present

## 2019-05-24 ENCOUNTER — Other Ambulatory Visit: Payer: Self-pay | Admitting: Physician Assistant

## 2019-05-24 DIAGNOSIS — G629 Polyneuropathy, unspecified: Secondary | ICD-10-CM

## 2019-06-06 DIAGNOSIS — N183 Chronic kidney disease, stage 3 (moderate): Secondary | ICD-10-CM | POA: Diagnosis not present

## 2019-06-06 DIAGNOSIS — I129 Hypertensive chronic kidney disease with stage 1 through stage 4 chronic kidney disease, or unspecified chronic kidney disease: Secondary | ICD-10-CM | POA: Diagnosis not present

## 2019-06-06 DIAGNOSIS — I639 Cerebral infarction, unspecified: Secondary | ICD-10-CM | POA: Diagnosis not present

## 2019-06-06 DIAGNOSIS — I4891 Unspecified atrial fibrillation: Secondary | ICD-10-CM | POA: Diagnosis not present

## 2019-06-12 ENCOUNTER — Other Ambulatory Visit: Payer: Self-pay

## 2019-06-21 ENCOUNTER — Telehealth: Payer: Self-pay | Admitting: Cardiology

## 2019-06-21 NOTE — Telephone Encounter (Signed)
Spoke with Patrick Roth and made him aware that we are waiting to hear from Dr. [name] on results.  Patrick Roth verbalized understanding.

## 2019-06-21 NOTE — Telephone Encounter (Signed)
New Message     Pt is calling for his monitor results    Please call

## 2019-06-30 ENCOUNTER — Other Ambulatory Visit: Payer: Self-pay | Admitting: Physician Assistant

## 2019-07-01 ENCOUNTER — Other Ambulatory Visit: Payer: Self-pay | Admitting: Physician Assistant

## 2019-07-02 ENCOUNTER — Other Ambulatory Visit: Payer: Self-pay | Admitting: Physician Assistant

## 2019-07-02 DIAGNOSIS — G629 Polyneuropathy, unspecified: Secondary | ICD-10-CM

## 2019-07-19 ENCOUNTER — Telehealth: Payer: Self-pay | Admitting: Cardiology

## 2019-07-19 NOTE — Telephone Encounter (Signed)
New message:  Patient called to go over some test results from his short term monitor he wore a couple months back. Please call the patient to discuss the results

## 2019-07-20 NOTE — Telephone Encounter (Signed)
Patient returning call for results 

## 2019-07-20 NOTE — Telephone Encounter (Signed)
Reviewed monitor results from first of July w/ pt. Pt states he is feeling good, no issues to report. Pt appreciates my going over results with him He understands to make follow up w/ Camnitz for later this year.

## 2019-08-09 ENCOUNTER — Other Ambulatory Visit: Payer: Self-pay | Admitting: Physician Assistant

## 2019-08-09 ENCOUNTER — Other Ambulatory Visit: Payer: Self-pay | Admitting: Cardiology

## 2019-08-09 DIAGNOSIS — G629 Polyneuropathy, unspecified: Secondary | ICD-10-CM

## 2019-08-10 NOTE — Telephone Encounter (Signed)
Pt last saw Dr [name] 04/10/19, last labs 11/28/18 Creat 1.40, age 73, weight 94.3kg, based on specified criteria pt is on appropriate dosage of Eliquis 5mg  BID.  Will refill rx.

## 2019-09-20 ENCOUNTER — Other Ambulatory Visit: Payer: Self-pay | Admitting: Physician Assistant

## 2019-09-20 DIAGNOSIS — G629 Polyneuropathy, unspecified: Secondary | ICD-10-CM

## 2019-10-04 ENCOUNTER — Other Ambulatory Visit: Payer: Self-pay | Admitting: Physician Assistant

## 2019-10-04 NOTE — Telephone Encounter (Signed)
Patient is over due for CPE with PCP.

## 2019-10-23 ENCOUNTER — Encounter: Payer: Self-pay | Admitting: Physician Assistant

## 2019-10-23 ENCOUNTER — Other Ambulatory Visit: Payer: Self-pay | Admitting: Physician Assistant

## 2019-10-23 ENCOUNTER — Ambulatory Visit (INDEPENDENT_AMBULATORY_CARE_PROVIDER_SITE_OTHER): Payer: Medicare Other | Admitting: Physician Assistant

## 2019-10-23 ENCOUNTER — Other Ambulatory Visit: Payer: Self-pay

## 2019-10-23 VITALS — BP 136/88 | HR 79 | Temp 97.5°F | Resp 16 | Ht 68.0 in | Wt 209.0 lb

## 2019-10-23 DIAGNOSIS — Z0000 Encounter for general adult medical examination without abnormal findings: Secondary | ICD-10-CM

## 2019-10-23 DIAGNOSIS — Z125 Encounter for screening for malignant neoplasm of prostate: Secondary | ICD-10-CM | POA: Diagnosis not present

## 2019-10-23 DIAGNOSIS — M5416 Radiculopathy, lumbar region: Secondary | ICD-10-CM

## 2019-10-23 DIAGNOSIS — E785 Hyperlipidemia, unspecified: Secondary | ICD-10-CM

## 2019-10-23 DIAGNOSIS — I10 Essential (primary) hypertension: Secondary | ICD-10-CM

## 2019-10-23 LAB — LIPID PANEL
Cholesterol: 253 mg/dL — ABNORMAL HIGH (ref 0–200)
HDL: 43.3 mg/dL (ref >39.00)
LDL Cholesterol: 182 mg/dL — ABNORMAL HIGH (ref 0–99)
NonHDL: 210.05
Total CHOL/HDL Ratio: 6
Triglycerides: 138 mg/dL (ref 0.0–149.0)
VLDL: 27.6 mg/dL (ref 0.0–40.0)

## 2019-10-23 LAB — CBC WITH DIFFERENTIAL/PLATELET
Basophils Absolute: 0 10*3/uL (ref 0.0–0.1)
Basophils Relative: 0.5 % (ref 0.0–3.0)
Eosinophils Absolute: 0.1 10*3/uL (ref 0.0–0.7)
Eosinophils Relative: 2.2 % (ref 0.0–5.0)
HCT: 52.3 % — ABNORMAL HIGH (ref 39.0–52.0)
Hemoglobin: 17.3 g/dL — ABNORMAL HIGH (ref 13.0–17.0)
Lymphocytes Relative: 32.5 % (ref 12.0–46.0)
Lymphs Abs: 0.9 10*3/uL (ref 0.7–4.0)
MCHC: 33.1 g/dL (ref 30.0–36.0)
MCV: 85.3 fl (ref 78.0–100.0)
Monocytes Absolute: 0.6 10*3/uL (ref 0.1–1.0)
Monocytes Relative: 20.9 % — ABNORMAL HIGH (ref 3.0–12.0)
Neutro Abs: 1.2 10*3/uL — ABNORMAL LOW (ref 1.4–7.7)
Neutrophils Relative %: 43.9 % (ref 43.0–77.0)
Platelets: 160 10*3/uL (ref 150.0–400.0)
RBC: 6.14 Mil/uL — ABNORMAL HIGH (ref 4.22–5.81)
RDW: 15.1 % (ref 11.5–15.5)
WBC: 2.7 10*3/uL — ABNORMAL LOW (ref 4.0–10.5)

## 2019-10-23 LAB — COMPREHENSIVE METABOLIC PANEL
ALT: 30 U/L (ref 0–53)
AST: 29 U/L (ref 0–37)
Albumin: 4.3 g/dL (ref 3.5–5.2)
Alkaline Phosphatase: 81 U/L (ref 39–117)
BUN: 14 mg/dL (ref 6–23)
CO2: 30 mEq/L (ref 19–32)
Calcium: 9.5 mg/dL (ref 8.4–10.5)
Chloride: 99 mEq/L (ref 96–112)
Creatinine, Ser: 1.35 mg/dL (ref 0.40–1.50)
GFR: 62.39 mL/min (ref >60.00)
Glucose, Bld: 92 mg/dL (ref 70–99)
Potassium: 3.6 mEq/L (ref 3.5–5.1)
Sodium: 138 mEq/L (ref 135–145)
Total Bilirubin: 1.5 mg/dL — ABNORMAL HIGH (ref 0.2–1.2)
Total Protein: 7.6 g/dL (ref 6.0–8.3)

## 2019-10-23 LAB — HEMOGLOBIN A1C: Hgb A1c MFr Bld: 5.8 % (ref 4.6–6.5)

## 2019-10-23 LAB — PSA, MEDICARE: PSA: 8.99 ng/ml — ABNORMAL HIGH (ref 0.10–4.00)

## 2019-10-23 NOTE — Patient Instructions (Signed)
Please go to the lab for blood work.   Our office will call you with your results unless you have chosen to receive results via MyChart.  If your blood work is normal we will follow-up each year for physicals and as scheduled for chronic medical problems.  If anything is abnormal we will treat accordingly and get you in for a follow-up.  Make sure to follow-up with Dr. [name] to discuss procedures.    Preventive Care 74 Years and Older, Male Preventive care refers to lifestyle choices and visits with your health care provider that can promote health and wellness. This includes:  A yearly physical exam. This is also called an annual well check.  Regular dental and eye exams.  Immunizations.  Screening for certain conditions.  Healthy lifestyle choices, such as diet and exercise. What can I expect for my preventive care visit? Physical exam Your health care provider will check:  Height and weight. These may be used to calculate body mass index (BMI), which is a measurement that tells if you are at a healthy weight.  Heart rate and blood pressure.  Your skin for abnormal spots. Counseling Your health care provider may ask you questions about:  Alcohol, tobacco, and drug use.  Emotional well-being.  Home and relationship well-being.  Sexual activity.  Eating habits.  History of falls.  Memory and ability to understand (cognition).  Work and work Statistician. What immunizations do I need?  Influenza (flu) vaccine  This is recommended every year. Tetanus, diphtheria, and pertussis (Tdap) vaccine  You may need a Td booster every 10 years. Varicella (chickenpox) vaccine  You may need this vaccine if you have not already been vaccinated. Zoster (shingles) vaccine  You may need this after age 33. Pneumococcal conjugate (PCV13) vaccine  One dose is recommended after age 50. Pneumococcal polysaccharide (PPSV23) vaccine  One dose is recommended after age 52.  Measles, mumps, and rubella (MMR) vaccine  You may need at least one dose of MMR if you were born in 1957 or later. You may also need a second dose. Meningococcal conjugate (MenACWY) vaccine  You may need this if you have certain conditions. Hepatitis A vaccine  You may need this if you have certain conditions or if you travel or work in places where you may be exposed to hepatitis A. Hepatitis B vaccine  You may need this if you have certain conditions or if you travel or work in places where you may be exposed to hepatitis B. Haemophilus influenzae type b (Hib) vaccine  You may need this if you have certain conditions. You may receive vaccines as individual doses or as more than one vaccine together in one shot (combination vaccines). Talk with your health care provider about the risks and benefits of combination vaccines. What tests do I need? Blood tests  Lipid and cholesterol levels. These may be checked every 5 years, or more frequently depending on your overall health.  Hepatitis C test.  Hepatitis B test. Screening  Lung cancer screening. You may have this screening every year starting at age 26 if you have a 30-pack-year history of smoking and currently smoke or have quit within the past 15 years.  Colorectal cancer screening. All adults should have this screening starting at age 48 and continuing until age 68. Your health care provider may recommend screening at age 58 if you are at increased risk. You will have tests every 1-10 years, depending on your results and the type of screening test.  Prostate cancer screening. Recommendations will vary depending on your family history and other risks.  Diabetes screening. This is done by checking your blood sugar (glucose) after you have not eaten for a while (fasting). You may have this done every 1-3 years.  Abdominal aortic aneurysm (AAA) screening. You may need this if you are a current or former smoker.  Sexually  transmitted disease (STD) testing. Follow these instructions at home: Eating and drinking  Eat a diet that includes fresh fruits and vegetables, whole grains, lean protein, and low-fat dairy products. Limit your intake of foods with high amounts of sugar, saturated fats, and salt.  Take vitamin and mineral supplements as recommended by your health care provider.  Do not drink alcohol if your health care provider tells you not to drink.  If you drink alcohol: ? Limit how much you have to 0-2 drinks a day. ? Be aware of how much alcohol is in your drink. In the [place] one drink equals one 12 oz bottle of beer (355 mL), one 5 oz glass of wine (148 mL), or one 1 oz glass of hard liquor (44 mL). Lifestyle  Take daily care of your teeth and gums.  Stay active. Exercise for at least 30 minutes on 5 or more days each week.  Do not use any products that contain nicotine or tobacco, such as cigarettes, e-cigarettes, and chewing tobacco. If you need help quitting, ask your health care provider.  If you are sexually active, practice safe sex. Use a condom or other form of protection to prevent STIs (sexually transmitted infections).  Talk with your health care provider about taking a low-dose aspirin or statin. What's next?  Visit your health care provider once a year for a well check visit.  Ask your health care provider how often you should have your eyes and teeth checked.  Stay up to date on all vaccines. This information is not intended to replace advice given to you by your health care provider. Make sure you discuss any questions you have with your health care provider. Document Released: 12/05/2015 Document Revised: 11/02/2018 Document Reviewed: 11/02/2018 Elsevier Patient Education  2020 [employer]. .

## 2019-10-23 NOTE — Progress Notes (Signed)
Patient presents to clinic today for annual exam.  Patient is fasting for labs.  Chronic Issues: Hypertension -- Is currently on a regimen of amlodipine 10 mg QD, Chlorthalidone 25 mg QD and Cardura 1 mg QD. Endorses taking as directed and tolerating well. Patient denies chest pain, palpitations, lightheadedness, dizziness, vision changes or frequent headaches.  BP Readings from Last 3 Encounters:  10/23/19 136/88  04/10/19 135/75  12/11/18 132/82   Hyperlipidemia -- Patent followed by Cardiology. Is not currently on statin due to hx of side effects. Is trying to keep a well-balanced diet. Keeps active.  Atrial Fib -- Stable. Managed by Cardiology. Taking his Amiodarone, BP medications and Eliquis as directed. Denies symptoms.  Health Maintenance: Immunizations -- flu and Tetanus up-to-date. Due for Prevanr. Declines today. Colonoscopy -- UTD  Past Medical History:  Diagnosis Date  . Colon polyps   . History of chicken pox   . Hyperlipidemia   . Hypertension   . Internal hemorrhoids   . Irregular heartbeat   . Sciatica   . Stroke [facility]) 2008    Past Surgical History:  Procedure Laterality Date  . CARDIOVERSION N/A 09/02/2017   Procedure: CARDIOVERSION;  Surgeon: [name], MD;  Location: [facility] ENDOSCOPY;  Service: Cardiovascular;  Laterality: N/A;  . COLONOSCOPY  2011  . great toe surgery    . PROSTATE BIOPSY      Current Outpatient Medications on File Prior to Visit  Medication Sig Dispense Refill  . amiodarone (PACERONE) 200 MG tablet Take 1 tablet by mouth once daily 90 tablet 3  . amLODipine (NORVASC) 10 MG tablet Take 10 mg by mouth daily.    . chlorthalidone (HYGROTON) 25 MG tablet Take 1 tablet by mouth once daily 90 tablet 0  . cloNIDine (CATAPRES - DOSED IN MG/24 HR) 0.2 mg/24hr patch Place 1 patch onto the skin once a week.  0  . doxazosin (CARDURA) 1 MG tablet Take 1 mg by mouth daily.    Marland Kitchen ELIQUIS 5 MG TABS tablet Take 1 tablet by mouth twice daily  60 tablet 5  . gabapentin (NEURONTIN) 100 MG capsule TAKE 1 CAPSULE BY MOUTH THREE TIMES DAILY 270 capsule 1  . potassium chloride SA (K-DUR) 20 MEQ tablet TAKE 1 TABLET BY MOUTH IN THE MORNING AND 2 TABLETS IN THE EVENING 270 tablet 0  . psyllium (REGULOID) 0.52 g capsule Take by mouth.     No current facility-administered medications on file prior to visit.     Allergies  Allergen Reactions  . Crestor [Rosuvastatin Calcium] Shortness Of Breath    Muscle cramps  . Spironolactone Shortness Of Breath  . Terazosin Shortness Of Breath, Palpitations and Other (See Comments)    Nerve pain  . Atenolol Other (See Comments)    Drowsiness and "flu sxs"  . Bystolic [Nebivolol Hcl] Other (See Comments)    GI issues  . Hydralazine Other (See Comments)    Joint swelling  . Tizanidine Other (See Comments)    Syncope, Elevated BP/pulse  . Amlodipine Other (See Comments)  . Clonidine Other (See Comments)  . Hydrocodone-Acetaminophen Other (See Comments)  . Levofloxacin Other (See Comments)  . Losartan Other (See Comments)    Did not work.  Pt states he couldn't take b/c of SE, but can't remember what SE were.  . Maxzide [Hydrochlorothiazide W-Triamterene] Other (See Comments)    Does not tolerate potassium-sparing diuretics  . Olmesartan Other (See Comments)  . Tape Other (See Comments)  Other reaction(s): Unknown  . Tussionex Pennkinetic Er [Hydrocod Polst-Cpm Polst Er] Other (See Comments)    Other reaction(s): Unknown    Family History  Problem Relation Age of Onset  . Hypertension Father 73       Deceased  . Heart disease Father   . Lung cancer Mother 72       Deceased  . Healthy Sister        x3  . Healthy Brother        x4  . Heart disease Brother        #5  . Other Daughter        Alpha Thalassemia  . Lupus Daughter        #2-deceased  . Diabetes Neg Hx   . Heart attack Neg Hx   . Hyperlipidemia Neg Hx   . Sudden death Neg Hx     Social History   Socioeconomic  History  . Marital status: Married    Spouse name: Not on file  . Number of children: 1  . Years of education: 43  . Highest education level: Not on file  Occupational History  . Occupation: Retired  Scientific laboratory technician  . Financial resource strain: Not on file  . Food insecurity    Worry: Not on file    Inability: Not on file  . Transportation needs    Medical: Not on file    Non-medical: Not on file  Tobacco Use  . Smoking status: Never Smoker  . Smokeless tobacco: Never Used  Substance and Sexual Activity  . Alcohol use: No  . Drug use: No  . Sexual activity: Not on file  Lifestyle  . Physical activity    Days per week: Not on file    Minutes per session: Not on file  . Stress: Not on file  Relationships  . Social Herbalist on phone: Not on file    Gets together: Not on file    Attends religious service: Not on file    Active member of club or organization: Not on file    Attends meetings of clubs or organizations: Not on file    Relationship status: Not on file  . Intimate partner violence    Fear of current or ex partner: Not on file    Emotionally abused: Not on file    Physically abused: Not on file    Forced sexual activity: Not on file  Other Topics Concern  . Not on file  Social History Narrative   Fun: Play golf and build furniture.    Review of Systems  Constitutional: Negative for fever and weight loss.  HENT: Negative for ear discharge, ear pain, hearing loss and tinnitus.   Eyes: Negative for blurred vision, double vision, photophobia and pain.  Respiratory: Negative for cough and shortness of breath.   Cardiovascular: Negative for chest pain and palpitations.  Gastrointestinal: Negative for abdominal pain, blood in stool, constipation, diarrhea, heartburn, melena, nausea and vomiting.  Genitourinary: Negative for dysuria, flank pain, frequency, hematuria and urgency.  Musculoskeletal: Negative for falls.  Neurological: Negative for dizziness,  loss of consciousness and headaches.  Endo/Heme/Allergies: Negative for environmental allergies.  Psychiatric/Behavioral: Negative for depression, hallucinations, substance abuse and suicidal ideas. The patient is not nervous/anxious and does not have insomnia.     BP 136/88   Pulse 79   Temp (!) 97.5 F (36.4 C) (Temporal)   Resp 16   Ht _0  (1.727 m)  Wt 209 lb (94.8 kg)   SpO2 95%   BMI 31.78 kg/m   Physical Exam Vitals signs reviewed.  Constitutional:      Appearance: Normal appearance.  HENT:     Head: Normocephalic and atraumatic.     Right Ear: Tympanic membrane normal.     Left Ear: Tympanic membrane normal.  Eyes:     Conjunctiva/sclera: Conjunctivae normal.     Pupils: Pupils are equal, round, and reactive to light.  Neck:     Musculoskeletal: Neck supple.  Cardiovascular:     Rate and Rhythm: Normal rate and regular rhythm.     Pulses: Normal pulses.     Heart sounds: Normal heart sounds.  Pulmonary:     Effort: Pulmonary effort is normal.     Breath sounds: Normal breath sounds.  Neurological:     General: No focal deficit present.     Mental Status: He is alert and oriented to person, place, and time.  Psychiatric:        Mood and Affect: Mood normal.     Assessment/Plan: 1. Essential hypertension BP stable today. Asymptomatic. Repeat labs today. Continue current regimen. - CBC w/Diff - Comp Met (CMET) - Hemoglobin A1c  2. Lumbar radiculopathy, chronic Continue management per ortho  3. Hyperlipidemia, unspecified hyperlipidemia type Repeat fasting lipids and A1C today. Dietary and exercise recommendations reviewed with patient.  - Lipid Profile - Hemoglobin A1c  4. Prostate cancer screening The natural history of prostate cancer and ongoing controversy regarding screening and potential treatment outcomes of prostate cancer has been discussed with the patient. The meaning of a false positive PSA and a false negative PSA has been discussed. He  indicates understanding of the limitations of this screening test and wishes  to proceed with screening PSA testing.  - PSA, Medicare   Leeanne Rio, PA-C

## 2019-10-24 ENCOUNTER — Other Ambulatory Visit: Payer: Self-pay

## 2019-10-24 DIAGNOSIS — R972 Elevated prostate specific antigen [PSA]: Secondary | ICD-10-CM

## 2019-10-24 DIAGNOSIS — D72829 Elevated white blood cell count, unspecified: Secondary | ICD-10-CM

## 2019-10-29 DIAGNOSIS — I10 Essential (primary) hypertension: Secondary | ICD-10-CM | POA: Diagnosis not present

## 2019-10-29 DIAGNOSIS — M5136 Other intervertebral disc degeneration, lumbar region: Secondary | ICD-10-CM | POA: Diagnosis not present

## 2019-10-29 DIAGNOSIS — M5416 Radiculopathy, lumbar region: Secondary | ICD-10-CM | POA: Diagnosis not present

## 2019-11-01 ENCOUNTER — Other Ambulatory Visit: Payer: Self-pay

## 2019-11-01 DIAGNOSIS — Z20822 Contact with and (suspected) exposure to covid-19: Secondary | ICD-10-CM

## 2019-11-04 LAB — NOVEL CORONAVIRUS, NAA: SARS-CoV-2, NAA: NOT DETECTED

## 2019-11-05 ENCOUNTER — Telehealth: Payer: Self-pay | Admitting: Hematology and Oncology

## 2019-11-05 ENCOUNTER — Telehealth: Payer: Self-pay

## 2019-11-05 NOTE — Telephone Encounter (Signed)
Caller given negative result and verbalized understanding  

## 2019-11-05 NOTE — Telephone Encounter (Signed)
Received a new hem referral from [place] for leukocytosis. Patrick Roth has been cld and sheduled to see Dr. Lorenso Courier on 12/23 at 10am. Pt aware to arrive 15 minutes early.

## 2019-11-08 DIAGNOSIS — H2513 Age-related nuclear cataract, bilateral: Secondary | ICD-10-CM | POA: Diagnosis not present

## 2019-11-09 DIAGNOSIS — D751 Secondary polycythemia: Secondary | ICD-10-CM | POA: Diagnosis not present

## 2019-11-09 DIAGNOSIS — N1831 Chronic kidney disease, stage 3a: Secondary | ICD-10-CM | POA: Diagnosis not present

## 2019-11-09 DIAGNOSIS — I4891 Unspecified atrial fibrillation: Secondary | ICD-10-CM | POA: Diagnosis not present

## 2019-11-09 DIAGNOSIS — I129 Hypertensive chronic kidney disease with stage 1 through stage 4 chronic kidney disease, or unspecified chronic kidney disease: Secondary | ICD-10-CM | POA: Diagnosis not present

## 2019-11-11 ENCOUNTER — Other Ambulatory Visit: Payer: Self-pay | Admitting: Physician Assistant

## 2019-11-14 ENCOUNTER — Inpatient Hospital Stay: Payer: Medicare Other | Attending: Hematology and Oncology | Admitting: Hematology and Oncology

## 2019-11-14 ENCOUNTER — Other Ambulatory Visit: Payer: Self-pay

## 2019-11-14 ENCOUNTER — Inpatient Hospital Stay: Payer: Medicare Other

## 2019-11-14 VITALS — BP 145/84 | HR 58 | Temp 97.8°F | Resp 16 | Wt 214.2 lb

## 2019-11-14 DIAGNOSIS — I10 Essential (primary) hypertension: Secondary | ICD-10-CM | POA: Insufficient documentation

## 2019-11-14 DIAGNOSIS — D709 Neutropenia, unspecified: Secondary | ICD-10-CM

## 2019-11-14 DIAGNOSIS — I4891 Unspecified atrial fibrillation: Secondary | ICD-10-CM | POA: Insufficient documentation

## 2019-11-14 DIAGNOSIS — Z801 Family history of malignant neoplasm of trachea, bronchus and lung: Secondary | ICD-10-CM | POA: Diagnosis not present

## 2019-11-14 DIAGNOSIS — D72819 Decreased white blood cell count, unspecified: Secondary | ICD-10-CM | POA: Insufficient documentation

## 2019-11-14 DIAGNOSIS — Z79899 Other long term (current) drug therapy: Secondary | ICD-10-CM | POA: Insufficient documentation

## 2019-11-14 DIAGNOSIS — D751 Secondary polycythemia: Secondary | ICD-10-CM | POA: Diagnosis not present

## 2019-11-14 LAB — CBC WITH DIFFERENTIAL (CANCER CENTER ONLY)
Abs Immature Granulocytes: 0 10*3/uL (ref 0.00–0.07)
Basophils Absolute: 0 10*3/uL (ref 0.0–0.1)
Basophils Relative: 0 %
Eosinophils Absolute: 0.1 10*3/uL (ref 0.0–0.5)
Eosinophils Relative: 2 %
HCT: 52.3 % — ABNORMAL HIGH (ref 39.0–52.0)
Hemoglobin: 16.9 g/dL (ref 13.0–17.0)
Immature Granulocytes: 0 %
Lymphocytes Relative: 30 %
Lymphs Abs: 0.8 10*3/uL (ref 0.7–4.0)
MCH: 27.8 pg (ref 26.0–34.0)
MCHC: 32.3 g/dL (ref 30.0–36.0)
MCV: 85.9 fL (ref 80.0–100.0)
Monocytes Absolute: 0.6 10*3/uL (ref 0.1–1.0)
Monocytes Relative: 22 %
Neutro Abs: 1.2 10*3/uL — ABNORMAL LOW (ref 1.7–7.7)
Neutrophils Relative %: 46 %
Platelet Count: 160 10*3/uL (ref 150–400)
RBC: 6.09 MIL/uL — ABNORMAL HIGH (ref 4.22–5.81)
RDW: 14.8 % (ref 11.5–15.5)
WBC Count: 2.6 10*3/uL — ABNORMAL LOW (ref 4.0–10.5)
nRBC: 0 % (ref 0.0–0.2)

## 2019-11-14 LAB — CMP (CANCER CENTER ONLY)
ALT: 33 U/L (ref 0–44)
AST: 37 U/L (ref 15–41)
Albumin: 4 g/dL (ref 3.5–5.0)
Alkaline Phosphatase: 75 U/L (ref 38–126)
Anion gap: 9 (ref 5–15)
BUN: 16 mg/dL (ref 8–23)
CO2: 31 mmol/L (ref 22–32)
Calcium: 8.9 mg/dL (ref 8.9–10.3)
Chloride: 101 mmol/L (ref 98–111)
Creatinine: 1.41 mg/dL — ABNORMAL HIGH (ref 0.61–1.24)
GFR, Est AFR Am: 56 mL/min — ABNORMAL LOW (ref >60)
GFR, Estimated: 49 mL/min — ABNORMAL LOW (ref >60)
Glucose, Bld: 86 mg/dL (ref 70–99)
Potassium: 3.5 mmol/L (ref 3.5–5.1)
Sodium: 141 mmol/L (ref 135–145)
Total Bilirubin: 1.6 mg/dL — ABNORMAL HIGH (ref 0.3–1.2)
Total Protein: 7.6 g/dL (ref 6.5–8.1)

## 2019-11-14 LAB — HEPATITIS C ANTIBODY: HCV Ab: NONREACTIVE

## 2019-11-14 LAB — C-REACTIVE PROTEIN: CRP: 0.6 mg/dL (ref <1.0)

## 2019-11-14 LAB — HEPATITIS B SURFACE ANTIGEN: Hepatitis B Surface Ag: NONREACTIVE

## 2019-11-14 LAB — HEPATITIS B SURFACE ANTIBODY,QUALITATIVE: Hep B S Ab: NONREACTIVE

## 2019-11-14 LAB — HEPATITIS B CORE ANTIBODY, TOTAL: Hep B Core Total Ab: NONREACTIVE

## 2019-11-14 LAB — TSH: TSH: 0.936 u[IU]/mL (ref 0.320–4.118)

## 2019-11-14 LAB — LACTATE DEHYDROGENASE: LDH: 237 U/L — ABNORMAL HIGH (ref 98–192)

## 2019-11-14 LAB — SEDIMENTATION RATE: Sed Rate: 1 mm/hr (ref 0–16)

## 2019-11-14 LAB — SAVE SMEAR(SSMR), FOR PROVIDER SLIDE REVIEW

## 2019-11-14 NOTE — Progress Notes (Signed)
[place] Telephone:([phone]) [phone]   Fax:([phone]) [place] NOTE  Patient Care Team: Delorse Limber as PCP - General (Physician Assistant) Constance Haw, MD as PCP - Cardiology (Cardiology) Irene Shipper, MD as Consulting Physician (Gastroenterology) [name], Wonda Cheng, MD as Consulting Physician (Cardiology) Christy Sartorius, MD as Referring Physician (Urology) Inocencio Homes, DPM as Consulting Physician (Podiatry) Myrle Sheng, MD as Referring Physician (Neurosurgery) Begovich, Geradine Girt, DO (Sports Medicine)  Hematological/Oncological History  #Leukopenia with Relative Monocytosis and Neutropenia #Polycythemia 1) 08/03/2017: WBC 4.3, Hgb 16.4, Plt 164 2) 08/11/2018: WBC 3.0, Hgb 18.1, Plt 176. ANC 1.2 3) 10/23/2019: WBC 2.7, Hgb 17.3, Plt 160. ANC 1.2. 20.9% Monocytes (Total Monocytes 600) 4) 11/14/2019: establish care with Dr. Lorenso Courier   CHIEF COMPLAINTS/PURPOSE OF CONSULTATION:  Polycythemia  HISTORY OF PRESENTING ILLNESS:  Patrick Roth 73 y.o. male with medical history significant for stroke, HTN, HLD, and atrial fibrillation on apixaban who presents for evaluation of leukopenia with relative monocytosis.  On review of the prior records Patrick Roth has had a low normal white blood cell count since at least 2008.  As far back as the records go in 2008 the patient did have a white blood cell count of 3.9 platelets of 145 hemoglobin 15.4 with an ANC of 2.6.  He subsequently hovered around a baseline of 4-6 WBC.  His first true neutropenia was noted on 08/11/2018 at which time he was noted to have a white blood cell count of 3.0 hemoglobin 18.1 and platelets of 176.  ANC at that time was 1200.  At that time he was also noted to have an increase in the percentage of his monocytes, though his total monocyte count was only 800.  Since that time his [place] has dropped as low as 900, but has mostly been between 1000 and 1500.  His hemoglobin has  also been elevated above normal limit, however platelets have been stable throughout this entire course.  On exam today Patrick Roth notes that he feels much better than he did a few years ago.  He reports that the primary driver of his symptoms over the last couple years has been his atrial fibrillation, however since this has been taken care of he has had much improvement in his symptoms.  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  On further discussion he notes that he used to wake up 5-6 times every night to urinate, however with recent interventions it is only been 2-3 times nightly.  He also reports that he cannot sleep on his abdomen and that his wife does report that he occasionally snores.  On further discussion he has had no abdominal surgeries.  He also denies having any issues with itching, sweating, nausea, vomiting, or diarrhea.  A full 10 point ROS was otherwise negative.  As an after thought the patient did mention that he was taking a sexual enhancer from [place].  He reports that he took some of these 3 to 4 months ago.  He order these from the Internet and is not entirely sure what their contents are.  MEDICAL HISTORY:  Past Medical History:  Diagnosis Date  . Colon polyps   . History of chicken pox   . Hyperlipidemia   . Hypertension   . Internal hemorrhoids   . Irregular heartbeat   . Sciatica   .  Stroke [facility]) 2008    SURGICAL HISTORY: Past Surgical History:  Procedure Laterality Date  . CARDIOVERSION N/A 09/02/2017   Procedure: CARDIOVERSION;  Surgeon: [name], MD;  Location: [facility] ENDOSCOPY;  Service: Cardiovascular;  Laterality: N/A;  . COLONOSCOPY  2011  . great toe surgery    . PROSTATE BIOPSY      SOCIAL HISTORY: Social History   Socioeconomic History  . Marital status: Married    Spouse name: Not on file  . Number of children: 1  . Years of education:  74  . Highest education level: Not on file  Occupational History  . Occupation: Retired  Tobacco Use  . Smoking status: Never Smoker  . Smokeless tobacco: Never Used  Substance and Sexual Activity  . Alcohol use: No  . Drug use: No  . Sexual activity: Not on file  Other Topics Concern  . Not on file  Social History Narrative   Fun: Play golf and build furniture.    Social Determinants of Health   Financial Resource Strain:   . Difficulty of Paying Living Expenses: Not on file  Food Insecurity:   . Worried About Charity fundraiser in the Last Year: Not on file  . Ran Out of Food in the Last Year: Not on file  Transportation Needs:   . Lack of Transportation (Medical): Not on file  . Lack of Transportation (Non-Medical): Not on file  Physical Activity:   . Days of Exercise per Week: Not on file  . Minutes of Exercise per Session: Not on file  Stress:   . Feeling of Stress : Not on file  Social Connections:   . Frequency of Communication with Friends and Family: Not on file  . Frequency of Social Gatherings with Friends and Family: Not on file  . Attends Religious Services: Not on file  . Active Member of Clubs or Organizations: Not on file  . Attends Archivist Meetings: Not on file  . Marital Status: Not on file  Intimate Partner Violence:   . Fear of Current or Ex-Partner: Not on file  . Emotionally Abused: Not on file  . Physically Abused: Not on file  . Sexually Abused: Not on file    FAMILY HISTORY: Family History  Problem Relation Age of Onset  . Hypertension Father 64       Deceased  . Heart disease Father   . Lung cancer Mother 61       Deceased  . Healthy Sister        x3  . Healthy Brother        x4  . Heart disease Brother        #5  . Other Daughter        Alpha Thalassemia  . Lupus Daughter        #2-deceased  . Diabetes Neg Hx   . Heart attack Neg Hx   . Hyperlipidemia Neg Hx   . Sudden death Neg Hx     ALLERGIES:  is  allergic to crestor [rosuvastatin calcium]; spironolactone; terazosin; atenolol; bystolic [nebivolol hcl]; hydralazine; tizanidine; amlodipine; clonidine; hydrocodone-acetaminophen; levofloxacin; losartan; maxzide [hydrochlorothiazide w-triamterene]; olmesartan; tape; and tussionex pennkinetic er [hydrocod polst-cpm polst er].  MEDICATIONS:  Current Outpatient Medications  Medication Sig Dispense Refill  . amiodarone (PACERONE) 200 MG tablet Take 1 tablet by mouth once daily 90 tablet 3  . amLODipine (NORVASC) 10 MG tablet Take 10 mg by mouth daily.    . chlorthalidone (HYGROTON) 25  MG tablet Take 1 tablet by mouth once daily 90 tablet 1  . cloNIDine (CATAPRES - DOSED IN MG/24 HR) 0.2 mg/24hr patch Place 1 patch onto the skin once a week.  0  . ELIQUIS 5 MG TABS tablet Take 1 tablet by mouth twice daily 60 tablet 5  . gabapentin (NEURONTIN) 100 MG capsule TAKE 1 CAPSULE BY MOUTH THREE TIMES DAILY 270 capsule 1  . potassium chloride SA (KLOR-CON) 20 MEQ tablet TAKE 1 TABLET BY MOUTH ONCE DAILY IN THE MORNING AND 2 TABLETS IN THE EVENING 270 tablet 0  . psyllium (REGULOID) 0.52 g capsule Take by mouth.    . doxazosin (CARDURA) 1 MG tablet Take 1 mg by mouth daily.     No current facility-administered medications for this visit.    REVIEW OF SYSTEMS:   Constitutional: ( - ) fevers, ( - )  chills , ( - ) night sweats Eyes: ( - ) blurriness of vision, ( - ) double vision, ( - ) watery eyes Ears, nose, mouth, throat, and face: ( - ) mucositis, ( - ) sore throat Respiratory: ( - ) cough, ( - ) dyspnea, ( - ) wheezes Cardiovascular: ( - ) palpitation, ( - ) chest discomfort, ( - ) lower extremity swelling Gastrointestinal:  ( - ) nausea, ( - ) heartburn, ( - ) change in bowel habits Skin: ( - ) abnormal skin rashes Lymphatics: ( - ) new lymphadenopathy, ( - ) easy bruising Neurological: ( - ) numbness, ( - ) tingling, ( - ) new weaknesses Behavioral/Psych: ( - ) mood change, ( - ) new changes    All other systems were reviewed with the patient and are negative.  PHYSICAL EXAMINATION: ECOG PERFORMANCE STATUS: 0 - Asymptomatic  Vitals:   11/14/19 0954  BP: (!) 145/84  Pulse: (!) 58  Resp: 16  Temp: 97.8 F (36.6 C)  SpO2: 100%   Filed Weights   11/14/19 0954  Weight: 214 lb 3.2 oz (97.2 kg)    GENERAL: well appearing elderly African American male in NAD  SKIN: skin color, texture, turgor are normal, no rashes or significant lesions. Lipoma on right lower portion of neck.  EYES: conjunctiva are pink and non-injected, sclera clear LUNGS: clear to auscultation and percussion with normal breathing effort HEART: regular rate & rhythm and no murmurs and no lower extremity edema ABDOMEN: soft, non-tender, non-distended, normal bowel sounds Musculoskeletal: no cyanosis of digits and no clubbing  PSYCH: alert & oriented x 3, fluent speech NEURO: no focal motor/sensory deficits  LABORATORY DATA:  I have reviewed the data as listed Recent Results (from the past 2160 hour(s))  CBC w/Diff     Status: Abnormal   Collection Time: 10/23/19 10:00 AM  Result Value Ref Range   WBC 2.7 (L) 4.0 - 10.5 K/uL   RBC 6.14 (H) 4.22 - 5.81 Mil/uL   Hemoglobin 17.3 (H) 13.0 - 17.0 g/dL   HCT 52.3 (H) 39.0 - 52.0 %   MCV 85.3 78.0 - 100.0 fl   MCHC 33.1 30.0 - 36.0 g/dL   RDW 15.1 11.5 - 15.5 %   Platelets 160.0 150.0 - 400.0 K/uL   Neutrophils Relative % 43.9 43.0 - 77.0 %   Lymphocytes Relative 32.5 12.0 - 46.0 %   Monocytes Relative (H) 3.0 - 12.0 %    20.9 Manual smear review agrees with instrument differential.   Eosinophils Relative 2.2 0.0 - 5.0 %   Basophils Relative 0.5 0.0 - 3.0 %  Neutro Abs 1.2 (L) 1.4 - 7.7 K/uL   Lymphs Abs 0.9 0.7 - 4.0 K/uL   Monocytes Absolute 0.6 0.1 - 1.0 K/uL   Eosinophils Absolute 0.1 0.0 - 0.7 K/uL   Basophils Absolute 0.0 0.0 - 0.1 K/uL  Comp Met (CMET)     Status: Abnormal   Collection Time: 10/23/19 10:00 AM  Result Value Ref Range    Sodium 138 135 - 145 mEq/L   Potassium 3.6 3.5 - 5.1 mEq/L   Chloride 99 96 - 112 mEq/L   CO2 30 19 - 32 mEq/L   Glucose, Bld 92 70 - 99 mg/dL   BUN 14 6 - 23 mg/dL   Creatinine, Ser 1.35 0.40 - 1.50 mg/dL   Total Bilirubin 1.5 (H) 0.2 - 1.2 mg/dL   Alkaline Phosphatase 81 39 - 117 U/L   AST 29 0 - 37 U/L   ALT 30 0 - 53 U/L   Total Protein 7.6 6.0 - 8.3 g/dL   Albumin 4.3 3.5 - 5.2 g/dL   GFR 62.39 >60.00 mL/min   Calcium 9.5 8.4 - 10.5 mg/dL  Lipid Profile     Status: Abnormal   Collection Time: 10/23/19 10:00 AM  Result Value Ref Range   Cholesterol 253 (H) 0 - 200 mg/dL    Comment: ATP III Classification       Desirable:  < 200 mg/dL               Borderline High:  200 - 239 mg/dL          High:  > = 240 mg/dL   Triglycerides 138.0 0.0 - 149.0 mg/dL    Comment: Normal:  <150 mg/dLBorderline High:  150 - 199 mg/dL   HDL 43.30 >39.00 mg/dL   VLDL 27.6 0.0 - 40.0 mg/dL   LDL Cholesterol 182 (H) 0 - 99 mg/dL   Total CHOL/HDL Ratio 6     Comment:                Men          Women1/2 Average Risk     3.4          3.3Average Risk          5.0          4.42X Average Risk          9.6          7.13X Average Risk          15.0          11.0                       NonHDL 210.05     Comment: NOTE:  Non-HDL goal should be 30 mg/dL higher than patient's LDL goal (i.e. LDL goal of < 70 mg/dL, would have non-HDL goal of < 100 mg/dL)  Hemoglobin A1c     Status: None   Collection Time: 10/23/19 10:00 AM  Result Value Ref Range   Hgb A1c MFr Bld 5.8 4.6 - 6.5 %    Comment: Glycemic Control Guidelines for People with Diabetes:Non Diabetic:  <6%Goal of Therapy: <7%Additional Action Suggested:  >8%   PSA, Medicare     Status: Abnormal   Collection Time: 10/23/19 10:00 AM  Result Value Ref Range   PSA 8.99 (H) 0.10 - 4.00 ng/ml    Comment: Test performed using Access Hybritech PSA Assay, a parmagnetic partical, chemiluminecent immunoassay.  Novel Coronavirus,  NAA ([facility])     Status: None    Collection Time: 11/01/19 12:00 AM   Specimen: Nasopharyngeal(NP) swabs in vial transport medium   NASOPHARYNGE  TESTING  Result Value Ref Range   SARS-CoV-2, NAA Not Detected Not Detected    Comment: This nucleic acid amplification test was developed and its performance characteristics determined by [employer] and [employer]. Nucleic acid amplification tests include PCR and TMA. This test has not been FDA cleared or approved. This test has been authorized by FDA under an Emergency Use Authorization (EUA). This test is only authorized for the duration of time the declaration that circumstances exist justifying the authorization of the emergency use of in vitro diagnostic tests for detection of SARS-CoV-2 virus and/or diagnosis of COVID-19 infection under section 564(b)(1) of the Act, 21 U.S.C. 277AJO-8(N) (1), unless the authorization is terminated or revoked sooner. When diagnostic testing is negative, the possibility of a false negative result should be considered in the context of a patient's recent exposures and the presence of clinical signs and symptoms consistent with COVID-19. An individual without symptoms of COVID-19 and who is not shedding SARS-CoV-2 virus would  expect to have a negative (not detected) result in this assay.   C-reactive protein     Status: None   Collection Time: 11/14/19 10:56 AM  Result Value Ref Range   CRP 0.6 <1.0 mg/dL    Comment: Performed at [facility], [place] [address], [place], [place] [address]  Hepatitis C antibody     Status: None   Collection Time: 11/14/19 10:57 AM  Result Value Ref Range   HCV Ab NON REACTIVE NON REACTIVE    Comment: (NOTE) Nonreactive HCV antibody screen is consistent with no HCV infections,  unless recent infection is suspected or other evidence exists to indicate HCV infection. Performed at [facility], [place] [address], [place], [place] [address]   Hepatitis B core antibody, total     Status:  None   Collection Time: 11/14/19 10:57 AM  Result Value Ref Range   Hep B Core Total Ab NON REACTIVE NON REACTIVE    Comment: Performed at [place] [address], [place], [place] [address]  Hepatitis B surface antigen     Status: None   Collection Time: 11/14/19 10:57 AM  Result Value Ref Range   Hepatitis B Surface Ag NON REACTIVE NON REACTIVE    Comment: Performed at [place] [address], [place], [place] [address]  Hepatitis B surface antibody     Status: None   Collection Time: 11/14/19 10:57 AM  Result Value Ref Range   Hep B S Ab NON REACTIVE NON REACTIVE    Comment: (NOTE) Inconsistent with immunity, less than 10 mIU/mL. Performed at [facility], [place] [address], [place], [place] [address]   Erythropoietin     Status: None   Collection Time: 11/14/19 10:57 AM  Result Value Ref Range   Erythropoietin 7.2 2.6 - 18.5 mIU/mL    Comment: (NOTE) [employer] UniCel DxI 800 Immunoassay System Values obtained with different assay methods or kits cannot be used interchangeably. Results cannot be interpreted as absolute evidence of the presence or absence of malignant disease. Performed At: [facility] [place], [place] [id number] Rush Farmer MD CL:[id number]   Sedimentation rate     Status: None   Collection Time: 11/14/19 10:57 AM  Result Value Ref Range   Sed Rate 1 0 - 16 mm/hr  Comment: Performed at [facility], [place] [address] [place], [place] [address]  CMP ([place] only)     Status: Abnormal   Collection Time: 11/14/19 10:57 AM  Result Value Ref Range   Sodium 141 135 - 145 mmol/L   Potassium 3.5 3.5 - 5.1 mmol/L   Chloride 101 98 - 111 mmol/L   CO2 31 22 - 32 mmol/L   Glucose, Bld 86 70 - 99 mg/dL   BUN 16 8 - 23 mg/dL   Creatinine 1.41 (H) 0.61 - 1.24 mg/dL   Calcium 8.9 8.9 - 10.3 mg/dL   Total Protein 7.6 6.5 - 8.1 g/dL   Albumin 4.0 3.5 - 5.0 g/dL   AST 37 15 - 41 U/L     ALT 33 0 - 44 U/L   Alkaline Phosphatase 75 38 - 126 U/L   Total Bilirubin 1.6 (H) 0.3 - 1.2 mg/dL   GFR, Est Non Af Am 49 (L) >60 mL/min   GFR, Est AFR Am 56 (L) >60 mL/min   Anion gap 9 5 - 15    Comment: Performed at [facility], [address] [place], [place] [address]  Save Smear [facility])     Status: None   Collection Time: 11/14/19 10:57 AM  Result Value Ref Range   Smear Review SMEAR STAINED AND AVAILABLE FOR REVIEW     Comment: Performed at [facility], [address], [place], [place] [address]  CBC with Differential ([place] Only)     Status: Abnormal   Collection Time: 11/14/19 10:57 AM  Result Value Ref Range   WBC Count 2.6 (L) 4.0 - 10.5 K/uL   RBC 6.09 (H) 4.22 - 5.81 MIL/uL   Hemoglobin 16.9 13.0 - 17.0 g/dL   HCT 52.3 (H) 39.0 - 52.0 %   MCV 85.9 80.0 - 100.0 fL   MCH 27.8 26.0 - 34.0 pg   MCHC 32.3 30.0 - 36.0 g/dL   RDW 14.8 11.5 - 15.5 %   Platelet Count 160 150 - 400 K/uL   nRBC 0.0 0.0 - 0.2 %   Neutrophils Relative % 46 %   Neutro Abs 1.2 (L) 1.7 - 7.7 K/uL   Lymphocytes Relative 30 %   Lymphs Abs 0.8 0.7 - 4.0 K/uL   Monocytes Relative 22 %   Monocytes Absolute 0.6 0.1 - 1.0 K/uL   Eosinophils Relative 2 %   Eosinophils Absolute 0.1 0.0 - 0.5 K/uL   Basophils Relative 0 %   Basophils Absolute 0.0 0.0 - 0.1 K/uL   Immature Granulocytes 0 %   Abs Immature Granulocytes 0.00 0.00 - 0.07 K/uL    Comment: Performed at [facility], [place] [address] [place], [place] [address]  TSH     Status: None   Collection Time: 11/14/19 10:58 AM  Result Value Ref Range   TSH 0.936 0.320 - 4.118 uIU/mL    Comment: Performed at [facility], [place] [address] [place], [place] [address]  Lactate dehydrogenase (LDH)     Status: Abnormal   Collection Time: 11/14/19 10:58 AM  Result Value Ref Range   LDH 237 (H) 98 - 192 U/L    Comment: Performed at [facility], [place] [address] [place], [place] [address]    PATHOLOGY: None relevant to review.   BLOOD FILM:  Review of the peripheral blood smear showed normal appearing white cells with neutrophils that were  appropriately lobated and granulated. There was no predominance of bi-lobed or hyper-segmented neutrophils appreciated. No Dohle bodies were noted. There was no left shifting, immature forms or blasts noted. Lymphocytes remain normal in size without any predominance of large granular lymphocytes. Red cells show no anisopoikilocytosis, macrocytes , microcytes or polychromasia. There were no schistocytes, target cells, echinocytes, acanthocytes, dacrocytes, or stomatocytes.There was no rouleaux formation, nucleated red cells, or intra-cellular inclusions noted. The platelets are normal in size, shape, and color without any clumping evident.  RADIOGRAPHIC STUDIES: None relevant to review.  No results found.  ASSESSMENT & PLAN Patrick Roth 73 y.o. male with medical history significant for stroke, HTN, HLD, and atrial fibrillation on apixaban who presents for evaluation of leukopenia with relative monocytosis.  On review of his labs and discussion with the patient these findings are concerning for an MPN versus other hematological abnormality including CMML.  These findings have progressed over the last year, whereas the patient had otherwise normal blood counts dating further back.  He is not currently taking any medications or had any infections that could explain these findings.  Additionally he is not a smoker.  Given these findings we will order an extensive MPN work-up as well as check his EPO and inflammatory levels.  In the event no etiology is clearly discerned from her below labs I would recommend that we consider a bone marrow biopsy given the new neutropenia and the elevated hemoglobin.  Additional consideration could be that the patient does have obstructive sleep apnea  which is causing the elevations in hemoglobin.  This would be supported by the finding of a normal or elevated erythropoietin level.  We could consider a sleep study prior to bone marrow biopsy as this would make the finding of polycythemia less concerning.  Overall I do recommend we maintain close follow-up with this patient and have him return to clinic in 6 weeks for further evaluation.  #Leukopenia with Relative Monocytosis and Neutropenia #Polycythemia --today will order a CBC, peripheral blood film, CMP, and TSH --due to concern for his elevations in Hgb and drop in WBC we will order a JAK2 panel w/ reflex for MPN and BCR/ABL. Additionally we will collect hepatitis B and C serologies today --will check EPO, ESR, and CRP levels today --if no etiology is found I would recommend a bone marrow biopsy to further evaluate.  --RTC in 6 weeks time to re-evaluate and discuss bone marrow biopsy if necessary.   Orders Placed This Encounter  Procedures  . CBC with Differential (Cancer Center Only)    Standing Status:   Future    Number of Occurrences:   1    Standing Expiration Date:   11/13/2020  . Save Smear ([facility])    Standing Status:   Future    Number of Occurrences:   1    Standing Expiration Date:   11/13/2020  . CMP ([place] only)    Standing Status:   Future    Number of Occurrences:   1    Standing Expiration Date:   11/13/2020  . Lactate dehydrogenase (LDH)    Standing Status:   Future    Number of Occurrences:   1    Standing Expiration Date:   11/13/2020  . Sedimentation rate    Standing Status:   Future    Number of Occurrences:   1    Standing Expiration Date:   11/13/2020  . TSH    Standing Status:   Future  Number of Occurrences:   1    Standing Expiration Date:   11/13/2020  . JAK2 (INCLUDING V617F AND EXON 12), MPL,& CALR W/RFL MPN PANEL (NGS)    Standing Status:   Future    Number of Occurrences:   1    Standing Expiration Date:   11/13/2020  . BCR ABL1  FISH ([facility])    Standing Status:   Future    Number of Occurrences:   1    Standing Expiration Date:   11/13/2020  . Erythropoietin    Standing Status:   Future    Number of Occurrences:   1    Standing Expiration Date:   11/13/2020  . Hepatitis B surface antibody    Standing Status:   Future    Number of Occurrences:   1    Standing Expiration Date:   11/13/2020  . Hepatitis B surface antigen    Standing Status:   Future    Number of Occurrences:   1    Standing Expiration Date:   11/13/2020  . Hepatitis B core antibody, total    Standing Status:   Future    Number of Occurrences:   1    Standing Expiration Date:   11/13/2020  . Hepatitis C antibody    Standing Status:   Future    Number of Occurrences:   1    Standing Expiration Date:   11/13/2020  . C-reactive protein    Standing Status:   Future    Number of Occurrences:   1    Standing Expiration Date:   11/13/2020    All questions were answered. The patient knows to call the clinic with any problems, questions or concerns.  A total of more than 60 minutes were spent on this encounter and over half of that time was spent on counseling and coordination of care as outlined above.   Ledell Peoples, MD Department of Hematology/Oncology [place] at [facility] Phone: [phone] Pager: [phone] Email: [email or web address]  11/15/2019 3:35 PM

## 2019-11-15 ENCOUNTER — Telehealth: Payer: Self-pay | Admitting: Hematology and Oncology

## 2019-11-15 LAB — ERYTHROPOIETIN: Erythropoietin: 7.2 m[IU]/mL (ref 2.6–18.5)

## 2019-11-15 NOTE — Telephone Encounter (Signed)
No los per 12/23.

## 2019-11-27 ENCOUNTER — Ambulatory Visit: Payer: Medicare Other | Admitting: Cardiology

## 2019-11-27 ENCOUNTER — Encounter: Payer: Self-pay | Admitting: Cardiology

## 2019-11-27 ENCOUNTER — Other Ambulatory Visit: Payer: Self-pay

## 2019-11-27 VITALS — BP 132/78 | HR 56 | Ht 68.0 in | Wt 211.0 lb

## 2019-11-27 DIAGNOSIS — I480 Paroxysmal atrial fibrillation: Secondary | ICD-10-CM | POA: Diagnosis not present

## 2019-11-27 MED ORDER — AMIODARONE HCL 200 MG PO TABS: 100.0000 mg | ORAL_TABLET | Freq: Every day | ORAL | 3 refills | Status: DC

## 2019-11-27 NOTE — Progress Notes (Signed)
Electrophysiology Office Note   Date:  11/27/2019   ID:  Patrick Roth, DOB 08/30/1946, MRN [id number]  PCP:  Brunetta Jeans, PA-C  Cardiologist:  [name] Primary Electrophysiologist:  Jaeleigh Monaco Meredith Leeds, MD    No chief complaint on file.    History of Present Illness: Patrick Roth is a 74 y.o. male who presents today for electrophysiology evaluation.   Hx of HTN and multiple drug intolerances, HL, prior CVA. He was admitted in 7/17 with syncope felt to be 2/2 orthostatic hypotension. He was found to be in new onset AF with RVR. He has been asymptomatic. He was started on Metoprolol Tartrate 25 bid and Xarelto for anticoagulation. Is on amiodarone for rhythm control.  Today, denies symptoms of palpitations, chest pain, shortness of breath, orthopnea, PND, lower extremity edema, claudication, dizziness, presyncope, syncope, bleeding, or neurologic sequela. The patient is tolerating medications without difficulties.  Overall he is doing well.  He is noted no further episodes of atrial fibrillation.  Approximately a month ago, he says his heart rate went into the 80s and his blood pressure was in the 1 teens.  Since that time, his heart rate is come back down nicely and his blood pressure has also come back up.  He says that he feels better than he has in quite a while.  Past Medical History:  Diagnosis Date  . Atrial flutter ([place])   . Colon polyps   . Constipation 05/27/2015  . DOE (dyspnea on exertion) 10/13/2015  . Faintness 06/20/2016  . History of chicken pox   . Hyperlipemia 02/13/2016  . Hyperlipidemia   . Hypertension   . Hypokalemia 06/28/2016  . Hyponatremia 06/28/2016  . Internal hemorrhoids   . Irregular heartbeat   . Malignant hypertension 07/15/2015  . Sciatica   . Stroke [facility]) 2008  . Syncope and collapse 06/28/2016   Past Surgical History:  Procedure Laterality Date  . CARDIOVERSION N/A 09/02/2017   Procedure: CARDIOVERSION;  Surgeon: [name], MD;   Location: [facility] ENDOSCOPY;  Service: Cardiovascular;  Laterality: N/A;  . COLONOSCOPY  2011  . great toe surgery    . PROSTATE BIOPSY       Current Outpatient Medications  Medication Sig Dispense Refill  . amiodarone (PACERONE) 200 MG tablet Take 1 tablet by mouth once daily 90 tablet 3  . amLODipine (NORVASC) 10 MG tablet Take 10 mg by mouth daily.    . chlorthalidone (HYGROTON) 25 MG tablet Take 1 tablet by mouth once daily 90 tablet 1  . ELIQUIS 5 MG TABS tablet Take 1 tablet by mouth twice daily 60 tablet 5  . gabapentin (NEURONTIN) 100 MG capsule Take 100 mg by mouth 4 (four) times daily.    . potassium chloride SA (KLOR-CON) 20 MEQ tablet Take 20 mEq by mouth 2 (two) times daily. 1 in the AM; 1.5 in the evening    . psyllium (REGULOID) 0.52 g capsule Take by mouth.     No current facility-administered medications for this visit.    Allergies:   Crestor [rosuvastatin calcium], Spironolactone, Terazosin, Atenolol, Bystolic [nebivolol hcl], Hydralazine, Tizanidine, Amlodipine, Clonidine, Hydrocodone-acetaminophen, Levofloxacin, Losartan, Maxzide [hydrochlorothiazide w-triamterene], Olmesartan, Tape, and Tussionex pennkinetic er [hydrocod polst-cpm polst er]   Social History:  The patient  reports that he has never smoked. He has never used smokeless tobacco. He reports that he does not drink alcohol or use drugs.   Family History:  The patient's family history includes Healthy in his brother and sister;  Heart disease in his brother and father; Hypertension (age of onset: 77) in his father; Lung cancer (age of onset: 72) in his mother; Lupus in his daughter; Other in his daughter.   ROS:  Please see the history of present illness.   Otherwise, review of systems is positive for none.   All other systems are reviewed and negative.   PHYSICAL EXAM: VS:  BP 132/78   Pulse (!) 56   Ht 5\' 8"  (1.727 m)   Wt 211 lb (95.7 kg)   SpO2 97%   BMI 32.08 kg/m  , BMI Body mass index is 32.08  kg/m. GEN: Well nourished, well developed, in no acute distress  HEENT: normal  Neck: no JVD, carotid bruits, or masses Cardiac: RRR; no murmurs, rubs, or gallops,no edema  Respiratory:  clear to auscultation bilaterally, normal work of breathing GI: soft, nontender, nondistended, + BS MS: no deformity or atrophy  Skin: warm and dry Neuro:  Strength and sensation are intact Psych: euthymic mood, full affect  EKG:  EKG is ordered today. Personal review of the ekg ordered shows sinus rhythm, RBBB   Recent Labs: 11/14/2019: ALT 33; BUN 16; Creatinine 1.41; Hemoglobin 16.9; Platelet Count 160; Potassium 3.5; Sodium 141; TSH 0.936    Lipid Panel     Component Value Date/Time   CHOL 253 (H) 10/23/2019 1000   CHOL 263 (H) 04/20/2019 0919   TRIG 138.0 10/23/2019 1000   HDL 43.30 10/23/2019 1000   HDL 45 04/20/2019 0919   CHOLHDL 6 10/23/2019 1000   VLDL 27.6 10/23/2019 1000   LDLCALC 182 (H) 10/23/2019 1000   LDLCALC 197 (H) 04/20/2019 0919     Wt Readings from Last 3 Encounters:  11/27/19 211 lb (95.7 kg)  11/14/19 214 lb 3.2 oz (97.2 kg)  10/23/19 209 lb (94.8 kg)      Other studies Reviewed: Additional studies/ records that were reviewed today include: TTE 02/28/18 Review of the above records today demonstrates:  - Left ventricle: The cavity size was normal. Systolic function was   normal. The estimated ejection fraction was in the range of 55%   to 60%. Wall motion was normal; there were no regional wall   motion abnormalities. There was a reduced contribution of atrial   contraction to ventricular filling, due to increased ventricular   diastolic pressure or atrial contractile dysfunction. The study   is not technically sufficient to allow evaluation of LV diastolic   function. (suspect left atrial mechanical failure, possible   stunning after recent episode of atrial fibrillation versus   chronic atrial dysfunction) - Left atrium: The atrium was mildly dilated. -  Right ventricle: The cavity size was mildly dilated. Wall   thickness was normal. - Right atrium: The atrium was mildly dilated. - Pulmonary arteries: Systolic pressure was moderately increased.   PA peak pressure: 60 mm Hg (S).  Myoview 02/24/18  Nuclear stress EF: 58%.  Normal perfusion. No ischemia  This is a low risk study.  ASSESSMENT AND PLAN:  1.  Paroxysmal atrial fibrillation/atrial flutter: Currently on amiodarone and Eliquis.  CHA2DS2-VASc of 4.  Fortunately he remains in sinus rhythm.  We Chalisa Kobler decrease amiodarone to 100 mg a day.   2. Hypertension: Currently well controlled  Current medicines are reviewed at length with the patient today.   The patient does not have concerns regarding his medicines.  The following changes were made today: Decrease amiodarone  Labs/ tests ordered today include:  Orders Placed This Encounter  Procedures  . EKG 12-Lead     Disposition:   FU with Henli Hey 12 months  Signed, Evee Liska Meredith Leeds, MD  11/27/2019 10:52 AM     [facility] [address] [place] [address] [phone] (office) [phone] (fax)

## 2019-11-27 NOTE — Patient Instructions (Addendum)
Medication Instructions:  Your physician has recommended you make the following change in your medication:  1. DECREASE Amiodarone to 100 mg daily  * If you need a refill on your cardiac medications before your next appointment, please call your pharmacy.   Labwork: None ordered  Testing/Procedures: None ordered  Follow-Up: At [facility], you and your health needs are our priority.  As part of our continuing mission to provide you with exceptional heart care, we have created designated Provider Care Teams.  These Care Teams include your primary Cardiologist (physician) and Advanced Practice Providers (APPs -  Physician Assistants and Nurse Practitioners) who all work together to provide you with the care you need, when you need it.  You will need a follow up appointment in 1 year.  Please call our office 2 months in advance to schedule this appointment.  You may see Dr [name] or one of the following Advanced Practice Providers on your designated Care Team:    Chanetta Marshall, NP  Tommye Standard, PA-C  Oda Kilts, [place]   Thank you for choosing [facility]!!   Trinidad Curet, RN [phone]  Any Other Special Instructions Will Be Listed Below (If Applicable).

## 2019-11-27 NOTE — Addendum Note (Signed)
Addended by: Stanton Kidney on: 11/27/2019 10:59 AM   Modules accepted: Orders

## 2019-11-28 LAB — BCR ABL1 FISH (GENPATH)

## 2019-11-28 LAB — JAK2 (INCLUDING V617F AND EXON 12), MPL,& CALR W/RFL MPN PANEL (NGS)

## 2019-12-04 ENCOUNTER — Other Ambulatory Visit: Payer: Self-pay | Admitting: Urology

## 2019-12-04 DIAGNOSIS — R972 Elevated prostate specific antigen [PSA]: Secondary | ICD-10-CM

## 2019-12-07 ENCOUNTER — Other Ambulatory Visit: Payer: Self-pay | Admitting: Emergency Medicine

## 2019-12-07 DIAGNOSIS — M5136 Other intervertebral disc degeneration, lumbar region: Secondary | ICD-10-CM

## 2019-12-07 DIAGNOSIS — M5416 Radiculopathy, lumbar region: Secondary | ICD-10-CM

## 2019-12-07 MED ORDER — GABAPENTIN 100 MG PO CAPS: 100.0000 mg | ORAL_CAPSULE | Freq: Four times a day (QID) | ORAL | 1 refills | Status: DC

## 2019-12-20 ENCOUNTER — Telehealth: Payer: Self-pay | Admitting: Hematology and Oncology

## 2019-12-20 NOTE — Telephone Encounter (Signed)
Scheduled per los. Called and left  Msg. Mailed printout

## 2020-01-01 ENCOUNTER — Other Ambulatory Visit: Payer: Self-pay

## 2020-01-01 ENCOUNTER — Other Ambulatory Visit: Payer: Self-pay | Admitting: Hematology and Oncology

## 2020-01-01 ENCOUNTER — Ambulatory Visit
Admission: RE | Admit: 2020-01-01 | Discharge: 2020-01-01 | Disposition: A | Discharge status: Home / Self Care | Payer: Medicare Other | Source: Ambulatory Visit | Attending: Urology | Admitting: Urology

## 2020-01-01 DIAGNOSIS — D709 Neutropenia, unspecified: Secondary | ICD-10-CM

## 2020-01-01 DIAGNOSIS — R972 Elevated prostate specific antigen [PSA]: Secondary | ICD-10-CM

## 2020-01-01 MED ORDER — GADOBENATE DIMEGLUMINE 529 MG/ML IV SOLN
20.0000 mL | Freq: Once | INTRAVENOUS | Status: AC | PRN
  Administered 2020-01-01: 20 mL via INTRAVENOUS

## 2020-01-01 NOTE — Progress Notes (Signed)
[place] [phone]   Fax:([phone]) [phone]  PROGRESS NOTE  Patient Care Team: Delorse Limber as PCP - General (Physician Assistant) Constance Haw, MD as PCP - Cardiology (Cardiology) Irene Shipper, MD as Consulting Physician (Gastroenterology) [name], Wonda Cheng, MD as Consulting Physician (Cardiology) Christy Sartorius, MD as Referring Physician (Urology) Inocencio Homes, DPM as Consulting Physician (Podiatry) Myrle Sheng, MD as Referring Physician (Neurosurgery) Begovich, Geradine Girt, DO (Sports Medicine)  Hematological/Oncological History #Leukopenia with Relative Monocytosis and Neutropenia #Polycythemia 1) 08/03/2017: WBC 4.3, Hgb 16.4, Plt 164 2) 08/11/2018: WBC 3.0, Hgb 18.1, Plt 176. ANC 1.2 3) 10/23/2019: WBC 2.7, Hgb 17.3, Plt 160. ANC 1.2. 20.9% Monocytes (Total Monocytes 600) 4) 11/14/2019: establish care with Dr. Lorenso Courier. WBC 2.6, Hgb 16.9, Plt 160. ANC 1200  Interval History:  Patrick Roth 74 y.o. male with medical history significant for neutropenia and polycythemia presents for a follow up visit. The patient's last visit was on 11/14/2019. In the interim since the last visit he has had no changes in his medications or health.  On exam today Patrick Roth notes that he feels well.  He reports that he has had no issues with fevers, chills, sweats, nausea, vomiting or diarrhea.  He reports he has had no changes in his medications and he has had no hospitalizations in the interim.  He denies having any weight loss or other changes.  He denies having any lymphadenopathy in his neck underarms or groin.  He notes that he has not had any changes in his diet, though he is concerned that he has had some shifts in his electrolytes for which she has been drinking Pedialyte.  He does have some issues with occasional fatigue and that he gets winded trying to walk up the stairs.  All of these issues are currently at baseline.  A full 10 point ROS  is listed below.  MEDICAL HISTORY:  Past Medical History:  Diagnosis Date  . Atrial flutter ([place])   . Colon polyps   . Constipation 05/27/2015  . DOE (dyspnea on exertion) 10/13/2015  . Faintness 06/20/2016  . History of chicken pox   . Hyperlipemia 02/13/2016  . Hyperlipidemia   . Hypertension   . Hypokalemia 06/28/2016  . Hyponatremia 06/28/2016  . Internal hemorrhoids   . Irregular heartbeat   . Malignant hypertension 07/15/2015  . Sciatica   . Stroke [facility]) 2008  . Syncope and collapse 06/28/2016    SURGICAL HISTORY: Past Surgical History:  Procedure Laterality Date  . CARDIOVERSION N/A 09/02/2017   Procedure: CARDIOVERSION;  Surgeon: [name], MD;  Location: [facility] ENDOSCOPY;  Service: Cardiovascular;  Laterality: N/A;  . COLONOSCOPY  2011  . great toe surgery    . PROSTATE BIOPSY      SOCIAL HISTORY: Social History   Socioeconomic History  . Marital status: Married    Spouse name: Not on file  . Number of children: 1  . Years of education: 82  . Highest education level: Not on file  Occupational History  . Occupation: Retired  Tobacco Use  . Smoking status: Never Smoker  . Smokeless tobacco: Never Used  Substance and Sexual Activity  . Alcohol use: No  . Drug use: No  . Sexual activity: Not on file  Other Topics Concern  . Not on file  Social History Narrative   Fun: Play golf and build furniture.    Social Determinants of Health   Financial Resource Strain:   .  Difficulty of Paying Living Expenses: Not on file  Food Insecurity:   . Worried About Charity fundraiser in the Last Year: Not on file  . Ran Out of Food in the Last Year: Not on file  Transportation Needs:   . Lack of Transportation (Medical): Not on file  . Lack of Transportation (Non-Medical): Not on file  Physical Activity:   . Days of Exercise per Week: Not on file  . Minutes of Exercise per Session: Not on file  Stress:   . Feeling of Stress : Not on file  Social Connections:   .  Frequency of Communication with Friends and Family: Not on file  . Frequency of Social Gatherings with Friends and Family: Not on file  . Attends Religious Services: Not on file  . Active Member of Clubs or Organizations: Not on file  . Attends Archivist Meetings: Not on file  . Marital Status: Not on file  Intimate Partner Violence:   . Fear of Current or Ex-Partner: Not on file  . Emotionally Abused: Not on file  . Physically Abused: Not on file  . Sexually Abused: Not on file    FAMILY HISTORY: Family History  Problem Relation Age of Onset  . Hypertension Father 38       Deceased  . Heart disease Father   . Lung cancer Mother 85       Deceased  . Healthy Sister        x3  . Healthy Brother        x4  . Heart disease Brother        #5  . Other Daughter        Alpha Thalassemia  . Lupus Daughter        #2-deceased  . Diabetes Neg Hx   . Heart attack Neg Hx   . Hyperlipidemia Neg Hx   . Sudden death Neg Hx     ALLERGIES:  is allergic to crestor [rosuvastatin calcium]; spironolactone; terazosin; atenolol; bystolic [nebivolol hcl]; hydralazine; tizanidine; amlodipine; clonidine; hydrocodone-acetaminophen; levofloxacin; losartan; maxzide [hydrochlorothiazide w-triamterene]; olmesartan; tape; and tussionex pennkinetic er [hydrocod polst-cpm polst er].  MEDICATIONS:  Current Outpatient Medications  Medication Sig Dispense Refill  . amiodarone (PACERONE) 200 MG tablet Take 0.5 tablets (100 mg total) by mouth daily. 45 tablet 3  . amLODipine (NORVASC) 10 MG tablet Take 10 mg by mouth daily.    . chlorthalidone (HYGROTON) 25 MG tablet Take 1 tablet by mouth once daily 90 tablet 1  . ELIQUIS 5 MG TABS tablet Take 1 tablet by mouth twice daily 60 tablet 5  . gabapentin (NEURONTIN) 100 MG capsule Take 1 capsule (100 mg total) by mouth 4 (four) times daily. 360 capsule 1  . psyllium (REGULOID) 0.52 g capsule Take by mouth.    . potassium chloride SA (KLOR-CON) 20 MEQ  tablet Take 1 tablet (20 mEq total) by mouth daily. 14 tablet 0   No current facility-administered medications for this visit.    REVIEW OF SYSTEMS:   Constitutional: ( - ) fevers, ( - )  chills , ( - ) night sweats Eyes: ( - ) blurriness of vision, ( - ) double vision, ( - ) watery eyes Ears, nose, mouth, throat, and face: ( - ) mucositis, ( - ) sore throat Respiratory: ( - ) cough, ( - ) dyspnea, ( - ) wheezes Cardiovascular: ( - ) palpitation, ( - ) chest discomfort, ( - ) lower extremity swelling Gastrointestinal:  ( - )  nausea, ( - ) heartburn, ( - ) change in bowel habits Skin: ( - ) abnormal skin rashes Lymphatics: ( - ) new lymphadenopathy, ( - ) easy bruising Neurological: ( - ) numbness, ( - ) tingling, ( - ) new weaknesses Behavioral/Psych: ( - ) mood change, ( - ) new changes  All other systems were reviewed with the patient and are negative.  PHYSICAL EXAMINATION: ECOG PERFORMANCE STATUS: 1 - Symptomatic but completely ambulatory  Vitals:   01/02/20 1029  BP: (!) 155/88  Pulse: 64  Resp: 18  Temp: 98.2 F (36.8 C)  SpO2: 100%   Filed Weights   01/02/20 1029  Weight: 212 lb 4.8 oz (96.3 kg)    GENERAL: well appearing elderly African American male in NAD  SKIN: skin color, texture, turgor are normal, no rashes or significant lesions. Lipoma on right lower portion of neck.  EYES: conjunctiva are pink and non-injected, sclera clear LUNGS: clear to auscultation and percussion with normal breathing effort HEART: regular rate & rhythm and no murmurs and no lower extremity edema ABDOMEN: soft, non-tender, non-distended, normal bowel sounds Musculoskeletal: no cyanosis of digits and no clubbing  PSYCH: alert & oriented x 3, fluent speech NEURO: no focal motor/sensory deficits  LABORATORY DATA:  I have reviewed the data as listed CBC Latest Ref Rng & Units 01/02/2020 11/14/2019 10/23/2019  WBC 4.0 - 10.5 K/uL 2.8(L) 2.6(L) 2.7(L)  Hemoglobin 13.0 - 17.0 g/dL 16.4  16.9 17.3(H)  Hematocrit 39.0 - 52.0 % 50.5 52.3(H) 52.3(H)  Platelets 150 - 400 K/uL 164 160 160.0    CMP Latest Ref Rng & Units 01/02/2020 11/14/2019 10/23/2019  Glucose 70 - 99 mg/dL 106(H) 86 92  BUN 8 - 23 mg/dL 16 16 14   Creatinine 0.61 - 1.24 mg/dL 1.36(H) 1.41(H) 1.35  Sodium 135 - 145 mmol/L 144 141 138  Potassium 3.5 - 5.1 mmol/L 3.2(L) 3.5 3.6  Chloride 98 - 111 mmol/L 105 101 99  CO2 22 - 32 mmol/L 28 31 30   Calcium 8.9 - 10.3 mg/dL 8.9 8.9 9.5  Total Protein 6.5 - 8.1 g/dL 7.5 7.6 7.6  Total Bilirubin 0.3 - 1.2 mg/dL 1.0 1.6(H) 1.5(H)  Alkaline Phos 38 - 126 U/L 74 75 81  AST 15 - 41 U/L 29 37 29  ALT 0 - 44 U/L 29 33 30     BLOOD FILM:  Review of the peripheral blood smear showed normal appearing white cells (though markedly decreased in number) with neutrophils that were appropriately lobated and granulated. There was no predominance of bi-lobed or hyper-segmented neutrophils appreciated. No Dohle bodies were noted. There was no left shifting, immature forms or blasts noted. Lymphocytes remain normal in size without any predominance of large granular lymphocytes. Red cells show no anisopoikilocytosis, macrocytes , microcytes or polychromasia. There were no schistocytes, target cells, echinocytes, acanthocytes, dacrocytes, or stomatocytes.There was no rouleaux formation, nucleated red cells, or intra-cellular inclusions noted. The platelets are normal in size, shape, and color without any clumping evident.  RADIOGRAPHIC STUDIES: None relevant to review.  MR PROSTATE W WO CONTRAST  Result Date: 01/01/2020 CLINICAL DATA:  Elevated PSA. PSA level of 8.99 on 11/30/2019 EXAM: MR PROSTATE WITHOUT AND WITH CONTRAST TECHNIQUE: Multiplanar multisequence MRI images were obtained of the pelvis centered about the prostate. Pre and post contrast images were obtained. CONTRAST:  61m MULTIHANCE GADOBENATE DIMEGLUMINE 529 MG/ML IV SOLN COMPARISON:  None FINDINGS: Prostate: Signs of BPH with  thinning of the peripheral zone. No signs of high-risk  lesion. Signs of prostate utricle cyst. This measures 9 mm. Volume: 6.5 x 5.8 x 5.8 (volume = 110 cc) cm Transcapsular spread:  Absent Seminal vesicle involvement: Absent Neurovascular bundle involvement: Absent Pelvic adenopathy: Absent Bone metastasis: Absent Other findings: None IMPRESSION: No signs of high-risk lesion with changes of BPH. PIRADS category 2 overall assessment. Electronically Signed   By: Zetta Bills M.D.   On: 01/01/2020 13:38    ASSESSMENT & PLAN Patrick Roth 74 y.o. male with medical history significant for stroke, HTN, HLD, and atrial fibrillation on apixaban who presents for evaluation of leukopenia with relative monocytosis.   After the previous visit no etiology was found for the patient's neutropenia.  Of note in the past the patient did have normal white blood cell count and therefore this change and apparent gradual decline in his white blood cell count is concerning.  Today we will order additional studies for leukopenia to include a full nutritional panel as well as an MM workup.  In the event that no clear etiology can be discerned I would recommend that we proceed with a bone marrow biopsy to rule out concerning etiologies for this neutropenia.  #Leukopenia with Relative Monocytosis and Neutropenia #Polycythemia, Likely Secondary  --today will repeat CBC, peripheral blood film, and CMP --will assess nutritional studies with Vitamin B12 ,folate, MMA, homocysteine, and copper --additionally will collect an SPEP and SFLC --negative results for JAK2 panel w/ reflex for MPN and BCR/ABL.  --EPO levels were WNL, implying a secondary cause (likely smoking). Inflammatory markers within normal limits.  --at this time I would recommend a bone marrow biopsy to further evaluate if the above labs reveal no etiology.  --RTC in 6 weeks time to re-evaluate and discuss results of bone marrow biopsy   #Elevated PSA --patient is  currently being evaluated by urology. --MRI prostate performed yesterday shows BPH but no discrete lesions --defer to urology, though we can assist if carcinoma of the prostate is found.   No orders of the defined types were placed in this encounter.  All questions were answered. The patient knows to call the clinic with any problems, questions or concerns.  A total of more than 20 minutes were spent on this encounter and over half of that time was spent on counseling and coordination of care as outlined above.   Ledell Peoples, MD Department of Hematology/Oncology [place] at [facility] Phone: [phone] Pager: [phone] Email: [email or web address]  01/02/2020 4:11 PM

## 2020-01-02 ENCOUNTER — Encounter: Payer: Self-pay | Admitting: Hematology and Oncology

## 2020-01-02 ENCOUNTER — Inpatient Hospital Stay: Payer: Medicare Other | Attending: Hematology and Oncology | Admitting: Hematology and Oncology

## 2020-01-02 ENCOUNTER — Inpatient Hospital Stay: Payer: Medicare Other

## 2020-01-02 ENCOUNTER — Other Ambulatory Visit: Payer: Self-pay

## 2020-01-02 VITALS — BP 155/88 | HR 64 | Temp 98.2°F | Resp 18 | Ht 68.0 in | Wt 212.3 lb

## 2020-01-02 DIAGNOSIS — D72819 Decreased white blood cell count, unspecified: Secondary | ICD-10-CM | POA: Diagnosis present

## 2020-01-02 DIAGNOSIS — Z8349 Family history of other endocrine, nutritional and metabolic diseases: Secondary | ICD-10-CM | POA: Insufficient documentation

## 2020-01-02 DIAGNOSIS — Z8249 Family history of ischemic heart disease and other diseases of the circulatory system: Secondary | ICD-10-CM | POA: Insufficient documentation

## 2020-01-02 DIAGNOSIS — Z801 Family history of malignant neoplasm of trachea, bronchus and lung: Secondary | ICD-10-CM | POA: Insufficient documentation

## 2020-01-02 DIAGNOSIS — D751 Secondary polycythemia: Secondary | ICD-10-CM | POA: Insufficient documentation

## 2020-01-02 DIAGNOSIS — I4891 Unspecified atrial fibrillation: Secondary | ICD-10-CM | POA: Insufficient documentation

## 2020-01-02 DIAGNOSIS — D708 Other neutropenia: Secondary | ICD-10-CM

## 2020-01-02 DIAGNOSIS — D709 Neutropenia, unspecified: Secondary | ICD-10-CM

## 2020-01-02 DIAGNOSIS — Z79899 Other long term (current) drug therapy: Secondary | ICD-10-CM | POA: Insufficient documentation

## 2020-01-02 DIAGNOSIS — R5383 Other fatigue: Secondary | ICD-10-CM | POA: Diagnosis not present

## 2020-01-02 DIAGNOSIS — Z8673 Personal history of transient ischemic attack (TIA), and cerebral infarction without residual deficits: Secondary | ICD-10-CM | POA: Insufficient documentation

## 2020-01-02 DIAGNOSIS — Z833 Family history of diabetes mellitus: Secondary | ICD-10-CM | POA: Diagnosis not present

## 2020-01-02 DIAGNOSIS — I10 Essential (primary) hypertension: Secondary | ICD-10-CM | POA: Insufficient documentation

## 2020-01-02 DIAGNOSIS — E785 Hyperlipidemia, unspecified: Secondary | ICD-10-CM | POA: Insufficient documentation

## 2020-01-02 DIAGNOSIS — N400 Enlarged prostate without lower urinary tract symptoms: Secondary | ICD-10-CM | POA: Insufficient documentation

## 2020-01-02 DIAGNOSIS — R972 Elevated prostate specific antigen [PSA]: Secondary | ICD-10-CM | POA: Insufficient documentation

## 2020-01-02 DIAGNOSIS — Z7901 Long term (current) use of anticoagulants: Secondary | ICD-10-CM | POA: Diagnosis not present

## 2020-01-02 LAB — CMP (CANCER CENTER ONLY)
ALT: 29 U/L (ref 0–44)
AST: 29 U/L (ref 15–41)
Albumin: 3.8 g/dL (ref 3.5–5.0)
Alkaline Phosphatase: 74 U/L (ref 38–126)
Anion gap: 11 (ref 5–15)
BUN: 16 mg/dL (ref 8–23)
CO2: 28 mmol/L (ref 22–32)
Calcium: 8.9 mg/dL (ref 8.9–10.3)
Chloride: 105 mmol/L (ref 98–111)
Creatinine: 1.36 mg/dL — ABNORMAL HIGH (ref 0.61–1.24)
GFR, Est AFR Am: 59 mL/min — ABNORMAL LOW (ref >60)
GFR, Estimated: 51 mL/min — ABNORMAL LOW (ref >60)
Glucose, Bld: 106 mg/dL — ABNORMAL HIGH (ref 70–99)
Potassium: 3.2 mmol/L — ABNORMAL LOW (ref 3.5–5.1)
Sodium: 144 mmol/L (ref 135–145)
Total Bilirubin: 1 mg/dL (ref 0.3–1.2)
Total Protein: 7.5 g/dL (ref 6.5–8.1)

## 2020-01-02 LAB — CBC WITH DIFFERENTIAL (CANCER CENTER ONLY)
Abs Immature Granulocytes: 0.01 10*3/uL (ref 0.00–0.07)
Basophils Absolute: 0 10*3/uL (ref 0.0–0.1)
Basophils Relative: 1 %
Eosinophils Absolute: 0 10*3/uL (ref 0.0–0.5)
Eosinophils Relative: 1 %
HCT: 50.5 % (ref 39.0–52.0)
Hemoglobin: 16.4 g/dL (ref 13.0–17.0)
Immature Granulocytes: 0 %
Lymphocytes Relative: 35 %
Lymphs Abs: 1 10*3/uL (ref 0.7–4.0)
MCH: 28.1 pg (ref 26.0–34.0)
MCHC: 32.5 g/dL (ref 30.0–36.0)
MCV: 86.5 fL (ref 80.0–100.0)
Monocytes Absolute: 0.5 10*3/uL (ref 0.1–1.0)
Monocytes Relative: 18 %
Neutro Abs: 1.2 10*3/uL — ABNORMAL LOW (ref 1.7–7.7)
Neutrophils Relative %: 45 %
Platelet Count: 164 10*3/uL (ref 150–400)
RBC: 5.84 MIL/uL — ABNORMAL HIGH (ref 4.22–5.81)
RDW: 15.1 % (ref 11.5–15.5)
WBC Count: 2.8 10*3/uL — ABNORMAL LOW (ref 4.0–10.5)
nRBC: 0 % (ref 0.0–0.2)

## 2020-01-02 LAB — RETIC PANEL
Immature Retic Fract: 6.9 % (ref 2.3–15.9)
RBC.: 5.84 MIL/uL — ABNORMAL HIGH (ref 4.22–5.81)
Retic Count, Absolute: 62.5 10*3/uL (ref 19.0–186.0)
Retic Ct Pct: 1.1 % (ref 0.4–3.1)
Reticulocyte Hemoglobin: 34 pg (ref >27.9)

## 2020-01-02 LAB — LACTATE DEHYDROGENASE: LDH: 229 U/L — ABNORMAL HIGH (ref 98–192)

## 2020-01-02 LAB — SAVE SMEAR(SSMR), FOR PROVIDER SLIDE REVIEW

## 2020-01-02 LAB — FOLATE: Folate: 9.9 ng/mL (ref >5.9)

## 2020-01-02 LAB — VITAMIN B12: Vitamin B-12: 490 pg/mL (ref 180–914)

## 2020-01-02 MED ORDER — POTASSIUM CHLORIDE CRYS ER 20 MEQ PO TBCR: 20.0000 meq | EXTENDED_RELEASE_TABLET | Freq: Every day | ORAL | 0 refills | Status: DC

## 2020-01-03 ENCOUNTER — Telehealth: Payer: Self-pay | Admitting: Hematology and Oncology

## 2020-01-03 ENCOUNTER — Telehealth: Payer: Self-pay

## 2020-01-03 LAB — PROTEIN ELECTROPHORESIS, SERUM
A/G Ratio: 1.1 (ref 0.7–1.7)
Albumin ELP: 2.7 g/dL — ABNORMAL LOW (ref 2.9–4.4)
Alpha-1-Globulin: 0.2 g/dL (ref 0.0–0.4)
Alpha-2-Globulin: 0.6 g/dL (ref 0.4–1.0)
Beta Globulin: 0.7 g/dL (ref 0.7–1.3)
Gamma Globulin: 1.1 g/dL (ref 0.4–1.8)
Globulin, Total: 2.5 g/dL (ref 2.2–3.9)
Total Protein ELP: 5.2 g/dL — ABNORMAL LOW (ref 6.0–8.5)

## 2020-01-03 LAB — KAPPA/LAMBDA LIGHT CHAINS
Kappa free light chain: 46.2 mg/L — ABNORMAL HIGH (ref 3.3–19.4)
Kappa, lambda light chain ratio: 2.38 — ABNORMAL HIGH (ref 0.26–1.65)
Lambda free light chains: 19.4 mg/L (ref 5.7–26.3)

## 2020-01-03 LAB — HOMOCYSTEINE: Homocysteine: 18.7 umol/L (ref 0.0–19.2)

## 2020-01-03 NOTE — Telephone Encounter (Signed)
Scheduled per los. Called and left msg. Mailed printout  °

## 2020-01-03 NOTE — Telephone Encounter (Signed)
Attempted x 1 to contact patient to schedule BMBX.  Unable to leave VM at this time.

## 2020-01-04 LAB — COPPER, SERUM: Copper: 139 ug/dL — ABNORMAL HIGH (ref 69–132)

## 2020-01-08 LAB — METHYLMALONIC ACID, SERUM: Methylmalonic Acid, Quantitative: 741 nmol/L — ABNORMAL HIGH (ref 0–378)

## 2020-01-09 ENCOUNTER — Telehealth: Payer: Self-pay | Admitting: Hematology and Oncology

## 2020-01-09 NOTE — Telephone Encounter (Signed)
Left VM informing pt that we need to reschedule labs due to weather. Pt's labs have been cancelled for 2/18. Pt was advised to call the office to reschedule.

## 2020-01-10 ENCOUNTER — Inpatient Hospital Stay: Payer: Medicare Other

## 2020-01-10 ENCOUNTER — Other Ambulatory Visit: Payer: Medicare Other

## 2020-01-15 ENCOUNTER — Other Ambulatory Visit: Payer: Self-pay

## 2020-01-15 ENCOUNTER — Inpatient Hospital Stay: Payer: Medicare Other

## 2020-01-15 ENCOUNTER — Inpatient Hospital Stay (HOSPITAL_BASED_OUTPATIENT_CLINIC_OR_DEPARTMENT_OTHER): Payer: Medicare Other | Admitting: Adult Health

## 2020-01-15 VITALS — BP 140/84 | HR 46 | Temp 98.3°F | Resp 18

## 2020-01-15 DIAGNOSIS — D751 Secondary polycythemia: Secondary | ICD-10-CM | POA: Diagnosis not present

## 2020-01-15 DIAGNOSIS — D708 Other neutropenia: Secondary | ICD-10-CM

## 2020-01-15 LAB — CBC WITH DIFFERENTIAL (CANCER CENTER ONLY)
Abs Immature Granulocytes: 0.01 10*3/uL (ref 0.00–0.07)
Basophils Absolute: 0 10*3/uL (ref 0.0–0.1)
Basophils Relative: 0 %
Eosinophils Absolute: 0.1 10*3/uL (ref 0.0–0.5)
Eosinophils Relative: 2 %
HCT: 47.4 % (ref 39.0–52.0)
Hemoglobin: 15.3 g/dL (ref 13.0–17.0)
Immature Granulocytes: 0 %
Lymphocytes Relative: 27 %
Lymphs Abs: 0.8 10*3/uL (ref 0.7–4.0)
MCH: 28 pg (ref 26.0–34.0)
MCHC: 32.3 g/dL (ref 30.0–36.0)
MCV: 86.7 fL (ref 80.0–100.0)
Monocytes Absolute: 0.8 10*3/uL (ref 0.1–1.0)
Monocytes Relative: 28 %
Neutro Abs: 1.3 10*3/uL — ABNORMAL LOW (ref 1.7–7.7)
Neutrophils Relative %: 43 %
Platelet Count: 140 10*3/uL — ABNORMAL LOW (ref 150–400)
RBC: 5.47 MIL/uL (ref 4.22–5.81)
RDW: 15.4 % (ref 11.5–15.5)
WBC Count: 2.9 10*3/uL — ABNORMAL LOW (ref 4.0–10.5)
nRBC: 0 % (ref 0.0–0.2)

## 2020-01-15 MED ORDER — LIDOCAINE HCL 2 % IJ SOLN
INTRAMUSCULAR | Status: AC
  Filled 2020-01-15: qty 20

## 2020-01-15 NOTE — Patient Instructions (Signed)
Bone Marrow Aspiration and Bone Marrow Biopsy, Adult, Care After This sheet gives you information about how to care for yourself after your procedure. Your health care provider may also give you more specific instructions. If you have problems or questions, contact your health care provider. What can I expect after the procedure? After the procedure, it is common to have:  Mild pain and tenderness.  Swelling.  Bruising. Follow these instructions at home: Puncture site care   Follow instructions from your health care provider about how to take care of the puncture site. Make sure you: ? Wash your hands with soap and water before and after you change your bandage (dressing). If soap and water are not available, use hand sanitizer. ? Change your dressing as told by your health care provider.  Check your puncture site every day for signs of infection. Check for: ? More redness, swelling, or pain. ? Fluid or blood. ? Warmth. ? Pus or a bad smell. Activity  Return to your normal activities as told by your health care provider. Ask your health care provider what activities are safe for you.  Do not lift anything that is heavier than 10 lb (4.5 kg), or the limit that you are told, until your health care provider says that it is safe.  Do not drive for 24 hours if you were given a sedative during your procedure. General instructions   Take over-the-counter and prescription medicines only as told by your health care provider.  Do not take baths, swim, or use a hot tub until your health care provider approves. Ask your health care provider if you may take showers. You may only be allowed to take sponge baths.  If directed, put ice on the affected area. To do this: ? Put ice in a plastic bag. ? Place a towel between your skin and the bag. ? Leave the ice on for 20 minutes, 2-3 times a day.  Keep all follow-up visits as told by your health care provider. This is important. Contact a  health care provider if:  Your pain is not controlled with medicine.  You have a fever.  You have more redness, swelling, or pain around the puncture site.  You have fluid or blood coming from the puncture site.  Your puncture site feels warm to the touch.  You have pus or a bad smell coming from the puncture site. Summary  After the procedure, it is common to have mild pain, tenderness, swelling, and bruising.  Follow instructions from your health care provider about how to take care of the puncture site and what activities are safe for you.  Take over-the-counter and prescription medicines only as told by your health care provider.  Contact a health care provider if you have any signs of infection, such as fluid or blood coming from the puncture site. This information is not intended to replace advice given to you by your health care provider. Make sure you discuss any questions you have with your health care provider. Document Revised: 03/27/2019 Document Reviewed: 03/27/2019 Elsevier Patient Education  2020 [employer]  

## 2020-01-15 NOTE — Progress Notes (Signed)
INDICATION: neutropenia  Brief examination was performed. ENT: adequate airway clearance Heart: regular rate and rhythm.No Murmurs Lungs: clear to auscultation, no wheezes, normal respiratory effort  Bone Marrow Biopsy and Aspiration Procedure Note   Informed consent was obtained and potential risks including bleeding, infection and pain were reviewed with the patient.  The patient's name, date of birth, identification, consent and allergies were verified prior to the start of procedure and time out was performed.  The right posterior iliac crest was chosen as the site of biopsy.  The skin was prepped with ChloraPrep.   16 cc of 2% lidocaine was used to provide local anaesthesia.   10 cc of bone marrow aspirate was obtained followed by 1cm biopsy.  Pressure was applied to the biopsy site and bandage was placed over the biopsy site. Patient was made to lie on the back for 30 mins prior to discharge.  The procedure was tolerated well. COMPLICATIONS: None BLOOD LOSS: none The patient was discharged home in stable condition with a 4 week follow up to review results.  Patient was provided with post bone marrow biopsy instructions and instructed to call if there was any bleeding or worsening pain.  Specimens sent for flow cytometry, cytogenetics and additional studies.  Signed Scot Dock, NP

## 2020-01-16 ENCOUNTER — Telehealth: Payer: Self-pay

## 2020-01-16 NOTE — Telephone Encounter (Signed)
Patient called in wanting to know if it was time for another colonoscopy. Last 12.5.11, informed patient that I would send message to PCP for approval of order. Please advise

## 2020-01-17 ENCOUNTER — Other Ambulatory Visit: Payer: Self-pay

## 2020-01-17 DIAGNOSIS — Z1211 Encounter for screening for malignant neoplasm of colon: Secondary | ICD-10-CM

## 2020-01-17 NOTE — Telephone Encounter (Signed)
I have him being due in December of this year. We can go ahead and place referral and GI can work on scheduling this at appropriate time.

## 2020-01-17 NOTE — Telephone Encounter (Signed)
Referral for gastro placed

## 2020-01-18 ENCOUNTER — Emergency Department (HOSPITAL_BASED_OUTPATIENT_CLINIC_OR_DEPARTMENT_OTHER)
Admission: EM | Admit: 2020-01-18 | Discharge: 2020-01-18 | Disposition: A | Discharge status: Home / Self Care | Payer: Medicare Other | Attending: Emergency Medicine | Admitting: Emergency Medicine

## 2020-01-18 ENCOUNTER — Encounter (HOSPITAL_BASED_OUTPATIENT_CLINIC_OR_DEPARTMENT_OTHER): Payer: Self-pay | Admitting: Emergency Medicine

## 2020-01-18 ENCOUNTER — Other Ambulatory Visit: Payer: Self-pay

## 2020-01-18 DIAGNOSIS — E785 Hyperlipidemia, unspecified: Secondary | ICD-10-CM | POA: Insufficient documentation

## 2020-01-18 DIAGNOSIS — Z7901 Long term (current) use of anticoagulants: Secondary | ICD-10-CM | POA: Diagnosis not present

## 2020-01-18 DIAGNOSIS — Z885 Allergy status to narcotic agent status: Secondary | ICD-10-CM | POA: Insufficient documentation

## 2020-01-18 DIAGNOSIS — Z8673 Personal history of transient ischemic attack (TIA), and cerebral infarction without residual deficits: Secondary | ICD-10-CM | POA: Insufficient documentation

## 2020-01-18 DIAGNOSIS — M5432 Sciatica, left side: Secondary | ICD-10-CM | POA: Diagnosis not present

## 2020-01-18 DIAGNOSIS — M25552 Pain in left hip: Secondary | ICD-10-CM | POA: Diagnosis present

## 2020-01-18 DIAGNOSIS — I10 Essential (primary) hypertension: Secondary | ICD-10-CM | POA: Diagnosis not present

## 2020-01-18 DIAGNOSIS — Z888 Allergy status to other drugs, medicaments and biological substances status: Secondary | ICD-10-CM | POA: Insufficient documentation

## 2020-01-18 DIAGNOSIS — Z881 Allergy status to other antibiotic agents status: Secondary | ICD-10-CM | POA: Insufficient documentation

## 2020-01-18 DIAGNOSIS — Z79899 Other long term (current) drug therapy: Secondary | ICD-10-CM | POA: Insufficient documentation

## 2020-01-18 LAB — SURGICAL PATHOLOGY

## 2020-01-18 MED ORDER — PREDNISONE 50 MG PO TABS
60.0000 mg | ORAL_TABLET | Freq: Once | ORAL | Status: AC
  Administered 2020-01-18: 60 mg via ORAL
  Filled 2020-01-18: qty 1

## 2020-01-18 MED ORDER — PREDNISONE 10 MG (21) PO TBPK: ORAL_TABLET | Freq: Every day | ORAL | 0 refills | Status: DC

## 2020-01-18 MED ORDER — OXYCODONE-ACETAMINOPHEN 5-325 MG PO TABS
1.0000 | ORAL_TABLET | Freq: Once | ORAL | Status: AC
  Administered 2020-01-18: 1 via ORAL
  Filled 2020-01-18: qty 1

## 2020-01-18 NOTE — ED Triage Notes (Signed)
Pt c/o LT hip pain x 2 wks; no injury

## 2020-01-18 NOTE — ED Provider Notes (Signed)
[facility] EMERGENCY DEPARTMENT Provider Note   CSN: [id number] Arrival date & time: 01/18/20  1949     History Chief Complaint  Patient presents with  . Hip Pain    Patrick Roth is a 74 y.o. male.  Presents to ER chief complaint low back pain radiating to left side.  Patient states he is had long history of sciatica on both left and right side but predominantly on right side.  Followed by Ortho and is also seen pain specialist has received injections previously.  Ortho today recommended injection with Dr. Arrie Eastern. Patient decided to come to ER when he found out injection wouldn't happen for a couple weeks.   Currently pain 4/10 in severity.  No associated numbness, weakness.  No bladder or bowel incontinence, no fever.  No recent falls.  MRI December at [facility]:    1. Regression of L4-5 disc protrusion with improved left lateral recess and neural foraminal stenosis. 2. No significant interval change of disc and facet degeneration elsewhere as above.  HPI     Past Medical History:  Diagnosis Date  . Atrial flutter ([place])   . Colon polyps   . Constipation 05/27/2015  . DOE (dyspnea on exertion) 10/13/2015  . Faintness 06/20/2016  . History of chicken pox   . Hyperlipemia 02/13/2016  . Hyperlipidemia   . Hypertension   . Hypokalemia 06/28/2016  . Hyponatremia 06/28/2016  . Internal hemorrhoids   . Irregular heartbeat   . Malignant hypertension 07/15/2015  . Sciatica   . Stroke [facility]) 2008  . Syncope and collapse 06/28/2016    Patient Active Problem List   Diagnosis Date Noted  . Cyst of perineum in male 11/28/2018  . Hyperlipidemia   . History of chicken pox   . Colon polyps   . Other intervertebral disc degeneration, lumbar region 05/09/2017  . Lumbar radiculopathy, chronic 03/29/2017  . Persistent atrial fibrillation ([place]) 09/14/2016  . Essential hypertension 05/27/2015  . Mallet deformity of fourth finger, right 07/11/2013  . Stroke [facility]) 08/23/2012      Past Surgical History:  Procedure Laterality Date  . CARDIOVERSION N/A 09/02/2017   Procedure: CARDIOVERSION;  Surgeon: [name], MD;  Location: [facility] ENDOSCOPY;  Service: Cardiovascular;  Laterality: N/A;  . COLONOSCOPY  2011  . great toe surgery    . PROSTATE BIOPSY         Family History  Problem Relation Age of Onset  . Hypertension Father 58       Deceased  . Heart disease Father   . Lung cancer Mother 54       Deceased  . Healthy Sister        x3  . Healthy Brother        x4  . Heart disease Brother        #5  . Other Daughter        Alpha Thalassemia  . Lupus Daughter        #2-deceased  . Diabetes Neg Hx   . Heart attack Neg Hx   . Hyperlipidemia Neg Hx   . Sudden death Neg Hx     Social History   Tobacco Use  . Smoking status: Never Smoker  . Smokeless tobacco: Never Used  Substance Use Topics  . Alcohol use: No  . Drug use: No    Home Medications Prior to Admission medications   Medication Sig Start Date End Date Taking? Authorizing Provider  amiodarone (PACERONE) 200 MG tablet Take  0.5 tablets (100 mg total) by mouth daily. 11/27/19   Camnitz, Will Hassell Done, MD  amLODipine (NORVASC) 10 MG tablet Take 10 mg by mouth daily.    [provider]  chlorthalidone (HYGROTON) 25 MG tablet Take 1 tablet by mouth once daily 11/12/19   Brunetta Jeans, PA-C  ELIQUIS 5 MG TABS tablet Take 1 tablet by mouth twice daily 08/10/19   Camnitz, Ocie Doyne, MD  gabapentin (NEURONTIN) 100 MG capsule Take 1 capsule (100 mg total) by mouth 4 (four) times daily. 12/07/19   Brunetta Jeans, PA-C  potassium chloride SA (KLOR-CON) 20 MEQ tablet Take 1 tablet (20 mEq total) by mouth daily. 01/02/20   Orson Slick, MD  predniSONE (STERAPRED UNI-PAK 21 TAB) 10 MG (21) TBPK tablet Take by mouth daily. Take 6 tabs by mouth daily  for 1 Day, then 5 tabs for 1 Day, then 4 tabs for 1 Day, then 3 tabs for 1 Day, 2 tabs for 1 Day, then 1 tab by mouth daily for 1 Day  01/18/20   Lucrezia Starch, MD  psyllium (REGULOID) 0.52 g capsule Take by mouth.    [provider]    Allergies    Crestor [rosuvastatin calcium], Spironolactone, Terazosin, Atenolol, Bystolic [nebivolol hcl], Hydralazine, Tizanidine, Amlodipine, Clonidine, Hydrocodone-acetaminophen, Levofloxacin, Losartan, Maxzide [hydrochlorothiazide w-triamterene], Olmesartan, Tape, and Tussionex pennkinetic er [hydrocod polst-cpm polst er]  Review of Systems   Review of Systems  Constitutional: Negative for chills and fever.  HENT: Negative for ear pain and sore throat.   Eyes: Negative for pain and visual disturbance.  Respiratory: Negative for cough and shortness of breath.   Cardiovascular: Negative for chest pain and palpitations.  Gastrointestinal: Negative for abdominal pain and vomiting.  Genitourinary: Negative for dysuria and hematuria.  Musculoskeletal: Positive for arthralgias and back pain.  Skin: Negative for color change and rash.  Neurological: Negative for seizures and syncope.  All other systems reviewed and are negative.   Physical Exam Updated Vital Signs BP (!) 148/78 (BP Location: Left Arm)   Pulse 60   Temp 98.2 F (36.8 C) (Oral)   Resp 16   Ht 5\' 8"  (1.727 m)   Wt 95.3 kg   SpO2 99%   BMI 31.93 kg/m   Physical Exam Vitals and nursing note reviewed.  Constitutional:      Appearance: He is well-developed.  HENT:     Head: Normocephalic and atraumatic.  Eyes:     Conjunctiva/sclera: Conjunctivae normal.  Cardiovascular:     Rate and Rhythm: Normal rate and regular rhythm.     Heart sounds: No murmur.  Pulmonary:     Effort: Pulmonary effort is normal. No respiratory distress.     Breath sounds: Normal breath sounds.  Abdominal:     Palpations: Abdomen is soft.     Tenderness: There is no abdominal tenderness.  Musculoskeletal:     Cervical back: Neck supple.     Comments: No TTP in midline T or L spine, there is some lateral lumbar region  TTP LLE: no focal TTP over hip, or remainder, normal joint ROM throughout.   Skin:    General: Skin is warm and dry.  Neurological:     General: No focal deficit present.     Mental Status: He is alert and oriented to person, place, and time.     Comments: 5/5 strength in b/l LE Sensation intact to b/l LE     ED Results / Procedures / Treatments  Labs (all labs ordered are listed, but only abnormal results are displayed) Labs Reviewed - No data to display  EKG None  Radiology No results found.  Procedures Procedures (including critical care time)  Medications Ordered in ED Medications  oxyCODONE-acetaminophen (PERCOCET/ROXICET) 5-325 MG per tablet 1 tablet (1 tablet Oral Given 01/18/20 2107)  predniSONE (DELTASONE) tablet 60 mg (60 mg Oral Given 01/18/20 2107)    ED Course  I have reviewed the triage vital signs and the nursing notes.  Pertinent labs & imaging results that were available during my care of the patient were reviewed by me and considered in my medical decision making (see chart for details).    MDM Rules/Calculators/A&P                      74 year old male presented to ER with complaint low back pain rating to left side.  History of prior episodes of sciatica.  Suspect this is similar.  No recent falls.  Had recent x-rays obtained by [facility] provider which were negative for fracture or acute pathology.  He had MRI performed in December of lumbar spine.  No spinal stenosis.  He has no focal neurologic deficits.  Suspect this is recurrence of sciatica.  Recommend trial of steroids.  Recommend close follow-up with his regular doctors.  Discharged home.    After the discussed management above, the patient was determined to be safe for discharge.  The patient was in agreement with this plan and all questions regarding their care were answered.  ED return precautions were discussed and the patient will return to the ED with any significant worsening of  condition.   Final Clinical Impression(s) / ED Diagnoses Final diagnoses:  Sciatica of left side    Rx / DC Orders ED Discharge Orders         Ordered    predniSONE (STERAPRED UNI-PAK 21 TAB) 10 MG (21) TBPK tablet  Daily     01/18/20 2101           Lucrezia Starch, MD 01/18/20 2351

## 2020-01-18 NOTE — Discharge Instructions (Signed)
Please take steroid taper as prescribed.  Please additionally take Tylenol as needed.  Please follow-up with your regular doctors regarding your pain.  Call Dr. Thompson Caul office on Monday to get close follow-up appointment.  If a you develop worsening pain, weakness, numbness, fever, swelling, return to ER for reassessment.

## 2020-01-23 ENCOUNTER — Encounter (HOSPITAL_COMMUNITY): Payer: Self-pay | Admitting: Hematology and Oncology

## 2020-01-24 ENCOUNTER — Emergency Department (HOSPITAL_BASED_OUTPATIENT_CLINIC_OR_DEPARTMENT_OTHER)
Admission: EM | Admit: 2020-01-24 | Discharge: 2020-01-24 | Disposition: A | Discharge status: Home / Self Care | Payer: Medicare Other | Attending: Emergency Medicine | Admitting: Emergency Medicine

## 2020-01-24 ENCOUNTER — Encounter (HOSPITAL_BASED_OUTPATIENT_CLINIC_OR_DEPARTMENT_OTHER): Payer: Self-pay | Admitting: *Deleted

## 2020-01-24 ENCOUNTER — Other Ambulatory Visit: Payer: Self-pay

## 2020-01-24 DIAGNOSIS — M25552 Pain in left hip: Secondary | ICD-10-CM | POA: Diagnosis present

## 2020-01-24 DIAGNOSIS — M5432 Sciatica, left side: Secondary | ICD-10-CM | POA: Insufficient documentation

## 2020-01-24 DIAGNOSIS — I10 Essential (primary) hypertension: Secondary | ICD-10-CM | POA: Diagnosis not present

## 2020-01-24 DIAGNOSIS — Z8673 Personal history of transient ischemic attack (TIA), and cerebral infarction without residual deficits: Secondary | ICD-10-CM | POA: Diagnosis not present

## 2020-01-24 DIAGNOSIS — Z79899 Other long term (current) drug therapy: Secondary | ICD-10-CM | POA: Insufficient documentation

## 2020-01-24 DIAGNOSIS — Z7901 Long term (current) use of anticoagulants: Secondary | ICD-10-CM | POA: Insufficient documentation

## 2020-01-24 MED ORDER — OXYCODONE-ACETAMINOPHEN 5-325 MG PO TABS
1.0000 | ORAL_TABLET | Freq: Once | ORAL | Status: AC
  Administered 2020-01-24: 1 via ORAL
  Filled 2020-01-24: qty 1

## 2020-01-24 MED ORDER — OXYCODONE-ACETAMINOPHEN 5-325 MG PO TABS: 1.0000 | ORAL_TABLET | ORAL | 0 refills | Status: DC | PRN

## 2020-01-24 NOTE — ED Triage Notes (Signed)
Left hip and back pain with radiation down his leg. He was seen for same a week ago but the pain is getting worse.

## 2020-01-24 NOTE — ED Provider Notes (Signed)
[facility] EMERGENCY DEPARTMENT Provider Note   CSN: [id number] Arrival date & time: 01/24/20  1816     History Chief Complaint  Patient presents with  . Back Pain    Patrick Roth is a 74 y.o. male.  Patient is a 74 year old male who presents with left hip and back pain.  He states is been going on for about 3 weeks.  He denies any recent falls.  The pain is over his sciatic nerve area and radiates down his left leg to about the knee.  He says it is worse when he is up walking.  No knee or ankle pain.  No fevers.  No abdominal pain.  He had similar pain on the right side several years ago.  He is followed by pain management.  He was seen by them on February 26 and scheduled to have an injection of the area on March 16.  At that time he did have x-rays of his pelvis and left hip which showed some arthritic changes and mild sclerotic changes.  He had an MRI done in December which showed some improvement of a bulging disc.  He was seen in the ED on February 26 as well for pain control.  He was started on a course of prednisone which he says did not help.  His pain management office started him on tramadol yesterday.  He says he needs something stronger for pain.        Past Medical History:  Diagnosis Date  . Atrial flutter ([place])   . Colon polyps   . Constipation 05/27/2015  . DOE (dyspnea on exertion) 10/13/2015  . Faintness 06/20/2016  . History of chicken pox   . Hyperlipemia 02/13/2016  . Hyperlipidemia   . Hypertension   . Hypokalemia 06/28/2016  . Hyponatremia 06/28/2016  . Internal hemorrhoids   . Irregular heartbeat   . Malignant hypertension 07/15/2015  . Sciatica   . Stroke [facility]) 2008  . Syncope and collapse 06/28/2016    Patient Active Problem List   Diagnosis Date Noted  . Cyst of perineum in male 11/28/2018  . Hyperlipidemia   . History of chicken pox   . Colon polyps   . Other intervertebral disc degeneration, lumbar region 05/09/2017  . Lumbar  radiculopathy, chronic 03/29/2017  . Persistent atrial fibrillation ([place]) 09/14/2016  . Essential hypertension 05/27/2015  . Mallet deformity of fourth finger, right 07/11/2013  . Stroke [facility]) 08/23/2012    Past Surgical History:  Procedure Laterality Date  . CARDIOVERSION N/A 09/02/2017   Procedure: CARDIOVERSION;  Surgeon: [name], MD;  Location: [facility] ENDOSCOPY;  Service: Cardiovascular;  Laterality: N/A;  . COLONOSCOPY  2011  . great toe surgery    . PROSTATE BIOPSY         Family History  Problem Relation Age of Onset  . Hypertension Father 49       Deceased  . Heart disease Father   . Lung cancer Mother 73       Deceased  . Healthy Sister        x3  . Healthy Brother        x4  . Heart disease Brother        #5  . Other Daughter        Alpha Thalassemia  . Lupus Daughter        #2-deceased  . Diabetes Neg Hx   . Heart attack Neg Hx   . Hyperlipidemia Neg Hx   .  Sudden death Neg Hx     Social History   Tobacco Use  . Smoking status: Never Smoker  . Smokeless tobacco: Never Used  Substance Use Topics  . Alcohol use: No  . Drug use: No    Home Medications Prior to Admission medications   Medication Sig Start Date End Date Taking? Authorizing Provider  amiodarone (PACERONE) 200 MG tablet Take 0.5 tablets (100 mg total) by mouth daily. 11/27/19   Camnitz, Will Hassell Done, MD  amLODipine (NORVASC) 10 MG tablet Take 10 mg by mouth daily.    [provider]  chlorthalidone (HYGROTON) 25 MG tablet Take 1 tablet by mouth once daily 11/12/19   Brunetta Jeans, PA-C  ELIQUIS 5 MG TABS tablet Take 1 tablet by mouth twice daily 08/10/19   Camnitz, Ocie Doyne, MD  gabapentin (NEURONTIN) 100 MG capsule Take 1 capsule (100 mg total) by mouth 4 (four) times daily. 12/07/19   Brunetta Jeans, PA-C  oxyCODONE-acetaminophen (PERCOCET/ROXICET) 5-325 MG tablet Take 1 tablet by mouth every 4 (four) hours as needed for severe pain. 01/24/20   Malvin Johns, MD    potassium chloride SA (KLOR-CON) 20 MEQ tablet Take 1 tablet (20 mEq total) by mouth daily. 01/02/20   Orson Slick, MD  predniSONE (STERAPRED UNI-PAK 21 TAB) 10 MG (21) TBPK tablet Take by mouth daily. Take 6 tabs by mouth daily  for 1 Day, then 5 tabs for 1 Day, then 4 tabs for 1 Day, then 3 tabs for 1 Day, 2 tabs for 1 Day, then 1 tab by mouth daily for 1 Day 01/18/20   Lucrezia Starch, MD  psyllium (REGULOID) 0.52 g capsule Take by mouth.    [provider]    Allergies    Crestor [rosuvastatin calcium], Spironolactone, Terazosin, Atenolol, Bystolic [nebivolol hcl], Hydralazine, Tizanidine, Amlodipine, Clonidine, Hydrocodone-acetaminophen, Levofloxacin, Losartan, Maxzide [hydrochlorothiazide w-triamterene], Olmesartan, Tape, and Tussionex pennkinetic er [hydrocod polst-cpm polst er]  Review of Systems   Review of Systems  Constitutional: Negative for fever.  Gastrointestinal: Negative for nausea and vomiting.  Musculoskeletal: Positive for arthralgias and back pain. Negative for joint swelling and neck pain.  Skin: Negative for wound.  Neurological: Negative for weakness, numbness and headaches.    Physical Exam Updated Vital Signs BP (!) 141/86   Pulse (!) 52   Temp 98 F (36.7 C) (Oral)   Resp 18   Ht 5\' 8"  (1.727 m)   Wt 94.3 kg   SpO2 99%   BMI 31.63 kg/m   Physical Exam Constitutional:      Appearance: He is well-developed.  HENT:     Head: Normocephalic and atraumatic.  Cardiovascular:     Rate and Rhythm: Normal rate.  Pulmonary:     Effort: Pulmonary effort is normal.  Musculoskeletal:        General: No tenderness.     Cervical back: Normal range of motion and neck supple.     Comments: Patient points to discomfort in his posterior left hip area over the sciatic nerve region.  There is no palpable tenderness to the spine.  No pain on range of motion of the hip joint.  No pain over the lateral hip.  There is no pain to the knee or ankle.  Pedal  pulses are intact.  He has normal sensation and motor function distally.  Skin:    General: Skin is warm and dry.  Neurological:     Mental Status: He is alert and oriented to person,  place, and time.     ED Results / Procedures / Treatments   Labs (all labs ordered are listed, but only abnormal results are displayed) Labs Reviewed - No data to display  EKG None  Radiology No results found.  Procedures Procedures (including critical care time)  Medications Ordered in ED Medications  oxyCODONE-acetaminophen (PERCOCET/ROXICET) 5-325 MG per tablet 1 tablet (1 tablet Oral Given 01/24/20 1900)    ED Course  I have reviewed the triage vital signs and the nursing notes.  Pertinent labs & imaging results that were available during my care of the patient were reviewed by me and considered in my medical decision making (see chart for details).    MDM Rules/Calculators/A&P                      Patient is a 74 year old male who presents with pain over his sciatic area.  There is no bony tenderness to the spine.  No tenderness on range of motion of the hip joint.  No neurologic deficits or signs of cauda equina.  No fevers or inflammatory changes that would be more concerning for infection.  No abdominal or groin pain that would be more concerning for AAA.  He had recent x-rays of his left hip and pelvis.  He scheduled to have an injection on March 16.  He needs some pain medicine to help relieve some of his pain until he can get an injection.  He is on Neurontin and he just tried tramadol yesterday but it does not seem to be effective.  He has an allergy listed to hydrocodone but he is not sure what kind of reaction he had.  It is listed as a low reaction but does not specify.  He does not know any pain medications that he has taken that he is done okay with.  He was given dose of oxycodone here in the emergency room and monitored for about an hour.  He had no reaction.  He was discharged home  in good condition and given a prescription for 12 tablets of Percocet.  He was advised not to use this along with the Tylenol but if he does not need the stronger medicine, he can go back to Tylenol.  He was also advised to use stool softener.  I advised him to follow-up with his pain management doctor on Monday if his symptoms are not improving.  Return precautions are given. Final Clinical Impression(s) / ED Diagnoses Final diagnoses:  Sciatica of left side    Rx / DC Orders ED Discharge Orders         Ordered    oxyCODONE-acetaminophen (PERCOCET/ROXICET) 5-325 MG tablet  Every 4 hours PRN     01/24/20 2002           Malvin Johns, MD 01/24/20 2006

## 2020-01-28 ENCOUNTER — Encounter (HOSPITAL_COMMUNITY): Payer: Self-pay | Admitting: Hematology and Oncology

## 2020-02-06 ENCOUNTER — Other Ambulatory Visit: Payer: Self-pay | Admitting: Physician Assistant

## 2020-02-07 ENCOUNTER — Other Ambulatory Visit: Payer: Self-pay | Admitting: Physician Assistant

## 2020-02-07 NOTE — Telephone Encounter (Signed)
Verifying patient medication. The Potassium was prescribed by Dr Lorenso Courier for 14 pills. Unsure of why only 14 pills were sent into the pharmacy. PCP denied rx for Potassium on 02/06/20 due to being filled too soon. Please advise of refill

## 2020-02-07 NOTE — Telephone Encounter (Signed)
Pt called in asking for a refill on the potassium chloride. He states that he takes 1 1/2 pills in the morning and 1 in the afternoon.   Please advise

## 2020-02-08 MED ORDER — POTASSIUM CHLORIDE CRYS ER 20 MEQ PO TBCR: EXTENDED_RELEASE_TABLET | ORAL | 3 refills | Status: DC

## 2020-02-08 NOTE — Telephone Encounter (Signed)
Refills sent

## 2020-02-09 ENCOUNTER — Other Ambulatory Visit: Payer: Self-pay | Admitting: Cardiology

## 2020-02-11 NOTE — Telephone Encounter (Signed)
Prescription refill request for Eliquis received.  Last office visit: 11/27/2019 Scr: 1.36, 01/02/2020 Age: 74 y.o. Weight: 94.3 kg   Prescription refill sent.

## 2020-02-12 ENCOUNTER — Other Ambulatory Visit: Payer: Self-pay | Admitting: Hematology and Oncology

## 2020-02-12 DIAGNOSIS — D708 Other neutropenia: Secondary | ICD-10-CM

## 2020-02-13 ENCOUNTER — Inpatient Hospital Stay: Payer: Medicare Other

## 2020-02-13 ENCOUNTER — Other Ambulatory Visit: Payer: Self-pay

## 2020-02-13 ENCOUNTER — Inpatient Hospital Stay: Payer: Medicare Other | Attending: Hematology and Oncology | Admitting: Hematology and Oncology

## 2020-02-13 VITALS — BP 157/89 | HR 64 | Temp 97.8°F | Resp 17 | Ht 68.0 in | Wt 215.5 lb

## 2020-02-13 DIAGNOSIS — N400 Enlarged prostate without lower urinary tract symptoms: Secondary | ICD-10-CM | POA: Insufficient documentation

## 2020-02-13 DIAGNOSIS — Z79899 Other long term (current) drug therapy: Secondary | ICD-10-CM | POA: Insufficient documentation

## 2020-02-13 DIAGNOSIS — Z7952 Long term (current) use of systemic steroids: Secondary | ICD-10-CM | POA: Diagnosis not present

## 2020-02-13 DIAGNOSIS — I4891 Unspecified atrial fibrillation: Secondary | ICD-10-CM | POA: Insufficient documentation

## 2020-02-13 DIAGNOSIS — E785 Hyperlipidemia, unspecified: Secondary | ICD-10-CM | POA: Diagnosis not present

## 2020-02-13 DIAGNOSIS — D751 Secondary polycythemia: Secondary | ICD-10-CM | POA: Insufficient documentation

## 2020-02-13 DIAGNOSIS — D72819 Decreased white blood cell count, unspecified: Secondary | ICD-10-CM | POA: Diagnosis present

## 2020-02-13 DIAGNOSIS — D709 Neutropenia, unspecified: Secondary | ICD-10-CM

## 2020-02-13 DIAGNOSIS — Z7901 Long term (current) use of anticoagulants: Secondary | ICD-10-CM | POA: Diagnosis not present

## 2020-02-13 DIAGNOSIS — D869 Sarcoidosis, unspecified: Secondary | ICD-10-CM | POA: Insufficient documentation

## 2020-02-13 DIAGNOSIS — Z8673 Personal history of transient ischemic attack (TIA), and cerebral infarction without residual deficits: Secondary | ICD-10-CM | POA: Diagnosis not present

## 2020-02-13 DIAGNOSIS — R972 Elevated prostate specific antigen [PSA]: Secondary | ICD-10-CM | POA: Insufficient documentation

## 2020-02-13 DIAGNOSIS — I10 Essential (primary) hypertension: Secondary | ICD-10-CM | POA: Diagnosis not present

## 2020-02-13 DIAGNOSIS — D708 Other neutropenia: Secondary | ICD-10-CM

## 2020-02-13 LAB — CMP (CANCER CENTER ONLY)
ALT: 29 U/L (ref 0–44)
AST: 26 U/L (ref 15–41)
Albumin: 3.9 g/dL (ref 3.5–5.0)
Alkaline Phosphatase: 70 U/L (ref 38–126)
Anion gap: 8 (ref 5–15)
BUN: 18 mg/dL (ref 8–23)
CO2: 31 mmol/L (ref 22–32)
Calcium: 9.3 mg/dL (ref 8.9–10.3)
Chloride: 101 mmol/L (ref 98–111)
Creatinine: 1.37 mg/dL — ABNORMAL HIGH (ref 0.61–1.24)
GFR, Est AFR Am: 58 mL/min — ABNORMAL LOW (ref >60)
GFR, Estimated: 50 mL/min — ABNORMAL LOW (ref >60)
Glucose, Bld: 91 mg/dL (ref 70–99)
Potassium: 3.6 mmol/L (ref 3.5–5.1)
Sodium: 140 mmol/L (ref 135–145)
Total Bilirubin: 1.4 mg/dL — ABNORMAL HIGH (ref 0.3–1.2)
Total Protein: 7.6 g/dL (ref 6.5–8.1)

## 2020-02-13 LAB — CBC WITH DIFFERENTIAL (CANCER CENTER ONLY)
Abs Immature Granulocytes: 0.03 10*3/uL (ref 0.00–0.07)
Basophils Absolute: 0 10*3/uL (ref 0.0–0.1)
Basophils Relative: 1 %
Eosinophils Absolute: 0.1 10*3/uL (ref 0.0–0.5)
Eosinophils Relative: 1 %
HCT: 53.8 % — ABNORMAL HIGH (ref 39.0–52.0)
Hemoglobin: 17.1 g/dL — ABNORMAL HIGH (ref 13.0–17.0)
Immature Granulocytes: 1 %
Lymphocytes Relative: 29 %
Lymphs Abs: 1.4 10*3/uL (ref 0.7–4.0)
MCH: 27.7 pg (ref 26.0–34.0)
MCHC: 31.8 g/dL (ref 30.0–36.0)
MCV: 87.1 fL (ref 80.0–100.0)
Monocytes Absolute: 0.9 10*3/uL (ref 0.1–1.0)
Monocytes Relative: 18 %
Neutro Abs: 2.5 10*3/uL (ref 1.7–7.7)
Neutrophils Relative %: 50 %
Platelet Count: 182 10*3/uL (ref 150–400)
RBC: 6.18 MIL/uL — ABNORMAL HIGH (ref 4.22–5.81)
RDW: 15.5 % (ref 11.5–15.5)
WBC Count: 4.9 10*3/uL (ref 4.0–10.5)
nRBC: 0 % (ref 0.0–0.2)

## 2020-02-13 LAB — LACTATE DEHYDROGENASE: LDH: 273 U/L — ABNORMAL HIGH (ref 98–192)

## 2020-02-13 NOTE — Progress Notes (Signed)
[place] [phone]   [phone]  PROGRESS NOTE  Patient Care Team: Delorse Limber as PCP - General (Physician Assistant) Constance Haw, MD as PCP - Cardiology (Cardiology) Irene Shipper, MD as Consulting Physician (Gastroenterology) [name], Wonda Cheng, MD as Consulting Physician (Cardiology) Christy Sartorius, MD as Referring Physician (Urology) Inocencio Homes, DPM as Consulting Physician (Podiatry) Myrle Sheng, MD as Referring Physician (Neurosurgery) Begovich, Geradine Girt, DO (Sports Medicine)  Hematological/Oncological History #Leukopenia with Relative Monocytosis and Neutropenia #Polycythemia 1) 08/03/2017: WBC 4.3, Hgb 16.4, Plt 164 2) 08/11/2018: WBC 3.0, Hgb 18.1, Plt 176. ANC 1.2 3) 10/23/2019: WBC 2.7, Hgb 17.3, Plt 160. ANC 1.2. 20.9% Monocytes (Total Monocytes 600) 4) 11/14/2019: establish care with Dr. Lorenso Courier. WBC 2.6, Hgb 16.9, Plt 160. ANC 1200 5) 01/02/2020: WBC 2.8,  Hgb 16.4, MCV 86.5, Plt 164 6) 01/15/2020: Bone marrow biopsy performed which revealed normocellular marrow with mild megakaryocytic dysplasia and noncaseating granulomas (consistent with sarcoidosis).   Interval History:  Patrick Roth 74 y.o. male with medical history significant for neutropenia and polycythemia presents for a follow up visit. The patient's last visit was on 11/14/2019. In the interim since the last visit he has had a bone marrow biopsy performed, the results of which we will review today.  On exam today Patrick Roth notes that he feels well.  He notes he has not had any changes in his health since his last visit with [place].  He denies any fevers, chills, sweats, nausea, vomiting or diarrhea.  He reports that his weight has been stable and his appetite has been good.  He recovered well from the bone marrow biopsy but did note some pain and soreness at the time of the procedure.  These did fade a few days after the procedure.  Patient notes  no other concerning changes in his health.  He denies any shortness of breath, cough, or chest pain.  A full 10 point ROS is listed below.  MEDICAL HISTORY:  Past Medical History:  Diagnosis Date  . Atrial flutter ([place])   . Colon polyps   . Constipation 05/27/2015  . DOE (dyspnea on exertion) 10/13/2015  . Faintness 06/20/2016  . History of chicken pox   . Hyperlipemia 02/13/2016  . Hyperlipidemia   . Hypertension   . Hypokalemia 06/28/2016  . Hyponatremia 06/28/2016  . Internal hemorrhoids   . Irregular heartbeat   . Malignant hypertension 07/15/2015  . Sciatica   . Stroke [facility]) 2008  . Syncope and collapse 06/28/2016   ALLERGIES:  is allergic to crestor [rosuvastatin calcium]; spironolactone; terazosin; atenolol; bystolic [nebivolol hcl]; hydralazine; tizanidine; amlodipine; clonidine; hydrocodone-acetaminophen; levofloxacin; losartan; maxzide [hydrochlorothiazide w-triamterene]; olmesartan; tape; and tussionex pennkinetic er [hydrocod polst-cpm polst er].  MEDICATIONS:  Current Outpatient Medications  Medication Sig Dispense Refill  . amiodarone (PACERONE) 200 MG tablet Take 0.5 tablets (100 mg total) by mouth daily. 45 tablet 3  . amLODipine (NORVASC) 10 MG tablet Take 10 mg by mouth daily.    . chlorthalidone (HYGROTON) 25 MG tablet Take 1 tablet by mouth once daily 90 tablet 1  . ELIQUIS 5 MG TABS tablet Take 1 tablet by mouth twice daily 60 tablet 5  . gabapentin (NEURONTIN) 100 MG capsule Take 1 capsule (100 mg total) by mouth 4 (four) times daily. 360 capsule 1  . oxyCODONE-acetaminophen (PERCOCET/ROXICET) 5-325 MG tablet Take 1 tablet by mouth every 4 (four) hours as needed for severe pain. 12 tablet 0  . potassium  chloride SA (KLOR-CON) 20 MEQ tablet Take 1 tablet by mouth in the morning and 2 tablets in the evening 90 tablet 3  . predniSONE (STERAPRED UNI-PAK 21 TAB) 10 MG (21) TBPK tablet Take by mouth daily. Take 6 tabs by mouth daily  for 1 Day, then 5 tabs for 1 Day, then 4  tabs for 1 Day, then 3 tabs for 1 Day, 2 tabs for 1 Day, then 1 tab by mouth daily for 1 Day 21 tablet 0  . psyllium (REGULOID) 0.52 g capsule Take by mouth.     No current facility-administered medications for this visit.    REVIEW OF SYSTEMS:   Constitutional: ( - ) fevers, ( - )  chills , ( - ) night sweats Eyes: ( - ) blurriness of vision, ( - ) double vision, ( - ) watery eyes Ears, nose, mouth, throat, and face: ( - ) mucositis, ( - ) sore throat Respiratory: ( - ) cough, ( - ) dyspnea, ( - ) wheezes Cardiovascular: ( - ) palpitation, ( - ) chest discomfort, ( - ) lower extremity swelling Gastrointestinal:  ( - ) nausea, ( - ) heartburn, ( - ) change in bowel habits Skin: ( - ) abnormal skin rashes Lymphatics: ( - ) new lymphadenopathy, ( - ) easy bruising Neurological: ( - ) numbness, ( - ) tingling, ( - ) new weaknesses Behavioral/Psych: ( - ) mood change, ( - ) new changes  All other systems were reviewed with the patient and are negative.  PHYSICAL EXAMINATION: ECOG PERFORMANCE STATUS: 1 - Symptomatic but completely ambulatory  Vitals:   02/13/20 0949  BP: (!) 157/89  Pulse: 64  Resp: 17  Temp: 97.8 F (36.6 C)  SpO2: 98%   Filed Weights   02/13/20 0949  Weight: 215 lb 8 oz (97.8 kg)    GENERAL: well appearing elderly African American male in NAD  SKIN: skin color, texture, turgor are normal, no rashes or significant lesions. Lipoma on right lower portion of neck.  EYES: conjunctiva are pink and non-injected, sclera clear LUNGS: clear to auscultation and percussion with normal breathing effort HEART: regular rate & rhythm and no murmurs and no lower extremity edema ABDOMEN: soft, non-tender, non-distended, normal bowel sounds Musculoskeletal: no cyanosis of digits and no clubbing  PSYCH: alert & oriented x 3, fluent speech NEURO: no focal motor/sensory deficits  LABORATORY DATA:  I have reviewed the data as listed CBC Latest Ref Rng & Units 02/13/2020  01/15/2020 01/02/2020  WBC 4.0 - 10.5 K/uL 4.9 2.9(L) 2.8(L)  Hemoglobin 13.0 - 17.0 g/dL 17.1(H) 15.3 16.4  Hematocrit 39.0 - 52.0 % 53.8(H) 47.4 50.5  Platelets 150 - 400 K/uL 182 140(L) 164    CMP Latest Ref Rng & Units 02/13/2020 01/02/2020 11/14/2019  Glucose 70 - 99 mg/dL 91 106(H) 86  BUN 8 - 23 mg/dL 18 16 16   Creatinine 0.61 - 1.24 mg/dL 1.37(H) 1.36(H) 1.41(H)  Sodium 135 - 145 mmol/L 140 144 141  Potassium 3.5 - 5.1 mmol/L 3.6 3.2(L) 3.5  Chloride 98 - 111 mmol/L 101 105 101  CO2 22 - 32 mmol/L 31 28 31   Calcium 8.9 - 10.3 mg/dL 9.3 8.9 8.9  Total Protein 6.5 - 8.1 g/dL 7.6 7.5 7.6  Total Bilirubin 0.3 - 1.2 mg/dL 1.4(H) 1.0 1.6(H)  Alkaline Phos 38 - 126 U/L 70 74 75  AST 15 - 41 U/L 26 29 37  ALT 0 - 44 U/L 29 29 33  BLOOD FILM:  From prior visit 01/01/2019: Review of the peripheral blood smear showed normal appearing white cells (though markedly decreased in number) with neutrophils that were appropriately lobated and granulated. There was no predominance of bi-lobed or hyper-segmented neutrophils appreciated. No Dohle bodies were noted. There was no left shifting, immature forms or blasts noted. Lymphocytes remain normal in size without any predominance of large granular lymphocytes. Red cells show no anisopoikilocytosis, macrocytes , microcytes or polychromasia. There were no schistocytes, target cells, echinocytes, acanthocytes, dacrocytes, or stomatocytes.There was no rouleaux formation, nucleated red cells, or intra-cellular inclusions noted. The platelets are normal in size, shape, and color without any clumping evident.  RADIOGRAPHIC STUDIES: None relevant to review.  No results found.  ASSESSMENT & PLAN Patrick Roth 74 y.o. male with medical history significant for stroke, HTN, HLD, and atrial fibrillation on apixaban who presents for f/u of leukopenia with relative monocytosis.   After the previous visit no etiology was found for the patient's neutropenia.  Bone  marrow biopsy has been performed in the interim which shows non because eating granulomas consistent with sarcoidosis of the bone marrow.  Also of note the patient had a normocellular marrow with mild megakaryocytic dysplasia.  These findings are nonspecific, though it is possible that the patient has bone marrow involvement of sarcoidosis which is driving his leukopenia.  On exam today the patient notes that he is asymptomatic and he feels well.  Prior imaging including ultrasound of the abdomen as well as chest x-ray have shown no concerning imaging, but most of these are several years old.  After discussion [place] conference we decided to pursue a CT scan in order to determine if there is sarcoidosis elsewhere, predominantly we are concerned about the lungs.  In the event that there is no further sarcoidosis I would recommend that we can continue to observe and consider treatment of the disorder if his counts were to continue to drop.  I would recommend we have the patient follow-up in 3 months time to assure that his counts are stable.  Of note during his visit today the patient's white blood cell count returned to normal.  This is most likely due to a steroid taper he was put on for hip pain.  The steroid pulse was started on 01/18/2020 and may be the reason we see a low, but normal white blood cell count today.  #Leukopenia with Relative Monocytosis and Neutropenia #Polycythemia, Likely Secondary  #Sarcoidosis Involvement of the Bone Marrow --bone marrow biopsy shows normocellular marrow with mild megakaryocytic dysplasia as well as noncaseating granulomas. These findings are concerning for sarcoidosis infiltration of the bone marrow.  --WBC normalized today, possibly in setting of steroid pulse the patient started on 01/18/2020 for his hip pain --continue to monitor his blood counts. No clear indication for treatment of sarcoidosis of the marrow at this time --will order CT imaging of the C/A/P to  assure no evidence of sarcoidosis elsewhere.  --RTC in 3 months to repeat bloodwork  #Elevated PSA --patient is currently being evaluated by urology. --MRI prostate performed yesterday shows BPH but no discrete lesions --defer to urology, though we can assist if carcinoma of the prostate is found.   Orders Placed This Encounter  Procedures  . CT Chest W Contrast    Standing Status:   Future    Standing Expiration Date:   02/12/2021    Order Specific Question:   ** REASON FOR EXAM (FREE TEXT)    Answer:   concern  for sarcoidosis    Order Specific Question:   If indicated for the ordered procedure, I authorize the administration of contrast media per Radiology protocol    Answer:   Yes    Order Specific Question:   Preferred imaging location?    Answer:   [facility]    Order Specific Question:   Radiology Contrast Protocol - do NOT remove file path    Answer:   \\[email or web address]  . CT Abdomen Pelvis W Contrast    Standing Status:   Future    Standing Expiration Date:   02/12/2021    Order Specific Question:   If indicated for the ordered procedure, I authorize the administration of contrast media per Radiology protocol    Answer:   Yes    Order Specific Question:   Preferred imaging location?    Answer:   [facility]    Order Specific Question:   Is Oral Contrast requested for this exam?    Answer:   Yes, Per Radiology protocol    Order Specific Question:   Radiology Contrast Protocol - do NOT remove file path    Answer:   \\[email or web address]   All questions were answered. The patient knows to call the clinic with any problems, questions or concerns.  A total of more than 30 minutes were spent on this encounter and over half of that time was spent on counseling and coordination of care as outlined above.   Ledell Peoples, MD Department of Hematology/Oncology [place] [facility] Phone: [phone] Pager: [phone] Email: [email or web address]  02/13/2020 5:43 PM

## 2020-02-14 ENCOUNTER — Telehealth: Payer: Self-pay | Admitting: Hematology and Oncology

## 2020-02-14 LAB — ANGIOTENSIN CONVERTING ENZYME: Angiotensin-Converting Enzyme: 57 U/L (ref 14–82)

## 2020-02-14 NOTE — Telephone Encounter (Signed)
Scheduled per los. Called and left msg. Mailed printout  °

## 2020-02-27 ENCOUNTER — Other Ambulatory Visit: Payer: Self-pay

## 2020-02-27 ENCOUNTER — Telehealth: Payer: Self-pay | Admitting: Physician Assistant

## 2020-02-27 ENCOUNTER — Ambulatory Visit (HOSPITAL_COMMUNITY)
Admission: RE | Admit: 2020-02-27 | Discharge: 2020-02-27 | Disposition: A | Discharge status: Home / Self Care | Payer: Medicare Other | Source: Ambulatory Visit | Attending: Hematology and Oncology | Admitting: Hematology and Oncology

## 2020-02-27 DIAGNOSIS — I700 Atherosclerosis of aorta: Secondary | ICD-10-CM | POA: Diagnosis not present

## 2020-02-27 DIAGNOSIS — I2510 Atherosclerotic heart disease of native coronary artery without angina pectoris: Secondary | ICD-10-CM | POA: Insufficient documentation

## 2020-02-27 DIAGNOSIS — N400 Enlarged prostate without lower urinary tract symptoms: Secondary | ICD-10-CM | POA: Diagnosis not present

## 2020-02-27 DIAGNOSIS — R911 Solitary pulmonary nodule: Secondary | ICD-10-CM | POA: Diagnosis not present

## 2020-02-27 DIAGNOSIS — D869 Sarcoidosis, unspecified: Secondary | ICD-10-CM | POA: Diagnosis not present

## 2020-02-27 MED ORDER — SODIUM CHLORIDE (PF) 0.9 % IJ SOLN
INTRAMUSCULAR | Status: AC
  Filled 2020-02-27: qty 50

## 2020-02-27 MED ORDER — IOHEXOL 300 MG/ML  SOLN
100.0000 mL | Freq: Once | INTRAMUSCULAR | Status: AC | PRN
  Administered 2020-02-27: 12:00:00 100 mL via INTRAVENOUS

## 2020-02-27 NOTE — Progress Notes (Signed)
  Chronic Care Management   Outreach Note  02/27/2020 Name: CHAO WILTGEN MRN: [id number] DOB: [birth year]  Referred by: Brunetta Jeans, PA-C Reason for referral : No chief complaint on file.   An unsuccessful telephone outreach was attempted today. The patient was referred to the pharmacist for assistance with care management and care coordination.   Follow Up Plan:   Earney Hamburg Upstream Scheduler

## 2020-02-29 ENCOUNTER — Telehealth: Payer: Self-pay | Admitting: Physician Assistant

## 2020-02-29 NOTE — Progress Notes (Signed)
  Chronic Care Management   Note  02/29/2020 Name: Patrick Roth MRN: [id number] DOB: [birth year]  Patrick Roth is a 74 y.o. year old male who is a primary care patient of Delorse Limber. I reached out to [name] by phone today in response to a referral sent by Patrick Roth's PCP, Brunetta Jeans, PA-C.   Patrick Roth was given information about Chronic Care Management services today including:  1. CCM service includes personalized support from designated clinical staff supervised by his physician, including individualized plan of care and coordination with other care providers 2. 24/7 contact phone numbers for assistance for urgent and routine care needs. 3. Service will only be billed when office clinical staff spend 20 minutes or more in a month to coordinate care. 4. Only one practitioner may furnish and bill the service in a calendar month. 5. The patient may stop CCM services at any time (effective at the end of the month) by phone call to the office staff.   Patient agreed to services and verbal consent obtained.   Follow up plan:   Earney Hamburg Upstream Scheduler

## 2020-02-29 NOTE — Progress Notes (Signed)
  Chronic Care Management   Outreach Note  02/29/2020 Name: Patrick Roth MRN: [id number] DOB: November 22, 1946  Referred by: Brunetta Jeans, PA-C Reason for referral : No chief complaint on file.   A second unsuccessful telephone outreach was attempted today. The patient was referred to pharmacist for assistance with care management and care coordination.  Follow Up Plan:   Earney Hamburg Upstream Scheduler

## 2020-03-05 ENCOUNTER — Telehealth: Payer: Self-pay | Admitting: *Deleted

## 2020-03-05 NOTE — Telephone Encounter (Signed)
TCT patient regarding results of of his recent CT scan.  No answer but was able to leave vm message for pt to return call to Dr. Libby Maw nurse 2 [phone]

## 2020-03-05 NOTE — Telephone Encounter (Signed)
-----   Message from Orson Slick, MD sent at 03/03/2020  2:20 PM EDT ----- Please call Patrick Roth to let him know the CT scan of his chest, abdomen, and pelvis showed no concerning abnormalities. He had a small spot on his lung that we can re-image in about 1 year.   We follow him for sarcoidosis in his bone marrow. The good news is the CT scan shows no sarcoidosis anywhere else. We will see him back in 3 months time to assure his blood counts are stable.  Patrick Roth  ----- Message ----- From: Buel Ream, Rad Results In Sent: 02/27/2020   3:07 PM EDT To: Orson Slick, MD

## 2020-03-06 ENCOUNTER — Telehealth: Payer: Self-pay | Admitting: *Deleted

## 2020-03-06 NOTE — Telephone Encounter (Signed)
TCT patient regarding results of his recent CT scan of chest, abd, pelvis.  Spoke with patient and advised that there is no evidence of sarcoid outside of his bone marrow.  Made him aware of small lung nodule that would just need to be re-scanned in 1 year.  Advised that Dr. Lorenso Courier will see him back in 3 months and appt has been made for 05/15/20.  Pt voiced understanding to all of the above.  No further questions or concerns.

## 2020-03-06 NOTE — Telephone Encounter (Signed)
-----   Message from Orson Slick, MD sent at 03/03/2020  2:20 PM EDT ----- Please call Mr. Nezat to let him know the CT scan of his chest, abdomen, and pelvis showed no concerning abnormalities. He had a small spot on his lung that we can re-image in about 1 year.   We follow him for sarcoidosis in his bone marrow. The good news is the CT scan shows no sarcoidosis anywhere else. We will see him back in 3 months time to assure his blood counts are stable.  Colan Neptune  ----- Message ----- From: Buel Ream, Rad Results In Sent: 02/27/2020   3:07 PM EDT To: Orson Slick, MD

## 2020-03-11 ENCOUNTER — Other Ambulatory Visit: Payer: Self-pay | Admitting: General Practice

## 2020-03-11 DIAGNOSIS — E785 Hyperlipidemia, unspecified: Secondary | ICD-10-CM

## 2020-03-11 DIAGNOSIS — I10 Essential (primary) hypertension: Secondary | ICD-10-CM

## 2020-03-19 NOTE — Progress Notes (Signed)
Chronic Care Management Pharmacy  Name: Patrick Roth  MRN: [id number] DOB: 01/06/1946  Chief Complaint/ HPI  Patrick Roth,  74 y.o. , male presents for their Initial CCM visit with the clinical pharmacist via telephone due to COVID-19 Pandemic.  PCP : Brunetta Jeans, PA-C  Their chronic conditions include: HLD, HTN, AFIB, CVA.  Consult Visit:  4/5 (Ortho, Dr. Payton Spark): Pain management; weaning off gabapentin to lose weight   3/4 (ED): Sciatica of left side, was started on tramadol day prior, given Percocet Rx. Reports no resolution with previous prednisone  2/26 (ED): Sciatica of left side, pain 4/10; #1 oxycodone 5-325 and prednisone taper given.  Outpatient Encounter Medications as of 03/20/2020  Medication Sig  . amLODipine (NORVASC) 10 MG tablet Take 10 mg by mouth daily.  . chlorthalidone (HYGROTON) 25 MG tablet Take 1 tablet by mouth once daily  . ELIQUIS 5 MG TABS tablet Take 1 tablet by mouth twice daily  . gabapentin (NEURONTIN) 100 MG capsule Take 1 capsule (100 mg total) by mouth 4 (four) times daily. (Patient taking differently: Take 100 mg by mouth 3 (three) times daily. )  . oxyCODONE-acetaminophen (PERCOCET/ROXICET) 5-325 MG tablet Take 1 tablet by mouth every 4 (four) hours as needed for severe pain.  . potassium chloride SA (KLOR-CON) 20 MEQ tablet Take 1 tablet by mouth in the morning and 2 tablets in the evening  . psyllium (REGULOID) 0.52 g capsule Take by mouth.  Marland Kitchen amiodarone (PACERONE) 200 MG tablet Take 0.5 tablets (100 mg total) by mouth daily. (Patient not taking: Reported on 03/20/2020)  . predniSONE (STERAPRED UNI-PAK 21 TAB) 10 MG (21) TBPK tablet Take by mouth daily. Take 6 tabs by mouth daily  for 1 Day, then 5 tabs for 1 Day, then 4 tabs for 1 Day, then 3 tabs for 1 Day, 2 tabs for 1 Day, then 1 tab by mouth daily for 1 Day (Patient not taking: Reported on 03/20/2020)   No facility-administered encounter medications on file as of 03/20/2020.    Current Diagnosis/Assessment: Goals Addressed            This Visit's Progress   . BP <140/90       CARE PLAN ENTRY (see longitudinal plan of care for additional care plan information) Current Barriers:  . Current antihypertensive regimen:   Amlodipine 10 mg daily  Chlorthalidone 25 mg daily   Amiodarone 100 mg daily   Potassium 20 meq daily   BP Readings from Last 3 Encounters:  02/13/20 (!) 157/89  01/24/20 (!) 153/94  01/18/20 (!) 148/78   . Current home BP readings: n/a.  Pharmacist Clinical Goal(s):  Marland Kitchen Over the next 180 days, patient will work with PharmD and providers to optimize antihypertensive regimen.  Interventions: . Inter-disciplinary care team collaboration (see longitudinal plan of care). . Comprehensive medication review performed; medication list updated in the electronic medical record.   Patient Self Care Activities:  . Consider checking BP at home 1-2x/week, document, and provide at future appointments. . Patient will focus on medication adherence by continuing current medication management.  Initial goal documentation.     . HR <70       CARE PLAN ENTRY (see longitudinal plan of care for additional care plan information) Current Barriers:  . Current AFib regimen:   Amiodarone 100 mg daily   Pulse Readings from Last 3 Encounters:  02/13/20 64  01/24/20 (!) 52  01/18/20 60   . Current home heart rate readings:  n/a.  Pharmacist Clinical Goal(s):  Marland Kitchen Over the next 180 days, patient will work with PharmD and providers to optimize Afib regimen as appropriate.  Interventions: . Inter-disciplinary care team collaboration (see longitudinal plan of care). . Comprehensive medication review performed; medication list updated in the electronic medical record.   Patient Self Care Activities:  . Consider checking pulse at home 1-2x/week, document, and provide at future appointments. . Patient will focus on medication adherence by continuing  current medication management.  Initial goal documentation.     . Total Cholesterol <200       CARE PLAN ENTRY (see longitudinal plan of care for additional care plan information) Current Barriers:  . Current antihyperlipidemic regimen:  o Not able to confirm today. . Previous antihyperlipidemic medications tried:  o Pravastatin, atorvastatin, simvastatin, rosuvastatin, Zetia. . Most recent lipid panel:     Component Value Date/Time   CHOL 253 (H) 10/23/2019 1000   CHOL 263 (H) 04/20/2019 0919   TRIG 138.0 10/23/2019 1000   HDL 43.30 10/23/2019 1000   HDL 45 04/20/2019 0919   CHOLHDL 6 10/23/2019 1000   VLDL 27.6 10/23/2019 1000   LDLCALC 182 (H) 10/23/2019 1000   LDLCALC 197 (H) 04/20/2019 0919   . ASCVD risk enhancing conditions: age >3, HTN.  Pharmacist Clinical Goal(s):  Marland Kitchen Over the next 180 days, patient will work with PharmD and providers towards optimized antihyperlipidemic therapy  Interventions: . Comprehensive medication review performed; medication list updated in electronic medical record.  Bertram Savin care team collaboration (see longitudinal plan of care)  Patient Self Care Activities:  . Patient will focus on medication adherence by continuing current management.   Initial goal documentation.      AFIB   Pulse Readings from Last 3 Encounters:  02/13/20 64  01/24/20 (!) 52  01/18/20 60   Patient is currently rate controlled. HR <70 BPM last several OV. Amiodarone 100 mg daily most recently reported, states no longer is taking. Denies dizziness, SOB, chest pain.  Taking Eliquis 5 mg twice daily for prevention of stroke in afib. Denies any side effects at this time. We discussed: patient assistance for Eliquis - see Medication Management a/p.  Plan Continue current medications.  Hypertension   Office blood pressures are  BP Readings from Last 3 Encounters:  02/13/20 (!) 157/89  01/24/20 (!) 153/94  01/18/20 (!) 148/78   Patient  currently taking the following medications:   Amlodipine 10 mg daily  Chlorthalidone 25 mg daily   Amiodarone 100 mg daily   Potassium 20 meq daily   BP has been borderline high. Expressed preference and position on not wanting to add/inc. any medications at this time. Denies dizziness, SOB, chest pain.  Patient checks BP at home: n/a; did not discuss.  Patient home BP readings are ranging: n/a; did not discuss.  Plan Continue current medications. Will continue to monitor BP.    Pain  Patient is currently controlled on the following medications: oxycodone 5-325 mg, gabapentin 300 mg daily. Is in the processes of weaning off of gabapentin due to weight gain. No side additional side effects reported at this time.  Plan Continue current medications.  Hyperlipidemia   Lipid Panel     Component Value Date/Time   CHOL 253 (H) 10/23/2019 1000   CHOL 263 (H) 04/20/2019 0919   TRIG 138.0 10/23/2019 1000   HDL 43.30 10/23/2019 1000   HDL 45 04/20/2019 0919   CHOLHDL 6 10/23/2019 1000   VLDL 27.6 10/23/2019 1000  LDLCALC 182 (H) 10/23/2019 1000   LDLCALC 197 (H) 04/20/2019 0919   LABVLDL 21 04/20/2019 0919    Patient mentions taking 2.5 tabs of cholesterol medication, we were not able to confirm medication/dosing today. Hx of statin intolerance. TG normal, LDL elevated at last visit 10/2020.   Plan Continue current management, will continue to monitor.   Vaccines  Reviewed vaccination history. Due for Shingrix and PNA if wanting to consider. PNA previously declined AMA.  Immunization History  Administered Date(s) Administered  . Influenza,inj,Quad PF,6+ Mos 07/23/2015  . Influenza-Unspecified 07/23/2018  . Tdap 07/05/2013   Plan Consider receiving PNA/Shingrix vaccine in office/pharmacy.   Medication Management  Pt uses [place] [facility] for all medications.   Mentions growing concern of eventually having to make choice b/w medications and food. Declined wanting to  receive food from local food drive programs, including [employer]. States still being able to eat what he wants at this time.  We discussed patient assistance programs for Eliquis. Mentions out of pocket cost will increase to >$300 in coverage gap.  Plan Continue current medication management strategy. Will review patient assistance program and work with patient to apply if we are able to.  Follow up: 6 month phone visit or sooner for PAP.  ______________ Visit Information SDOH (Social Determinants of Health) assessments performed: Yes.   Food Insecurity: No Food Insecurity  . Worried About Charity fundraiser in the Last Year: Never true  . Ran Out of Food in the Last Year: Never true     Transportation Needs: No Transportation Needs  . Lack of Transportation (Medical): No  . Lack of Transportation (Non-Medical): No   Mr. Heileman was given information about Chronic Care Management services today including:  1. CCM service includes personalized support from designated clinical staff supervised by his physician, including individualized plan of care and coordination with other care providers 2. 24/7 contact phone numbers for assistance for urgent and routine care needs. 3. Standard insurance, coinsurance, copays and deductibles apply for chronic care management only during months in which we provide at least 20 minutes of these services. Most insurances cover these services at 100%, however patients may be responsible for any copay, coinsurance and/or deductible if applicable. This service may help you avoid the need for more expensive face-to-face services. 4. Only one practitioner may furnish and bill the service in a calendar month. 5. The patient may stop CCM services at any time (effective at the end of the month) by phone call to the office staff.  Patient agreed to services and verbal consent obtained.   [name], Pharm.D. Clinical Pharmacist [facility] at  [facility] [phone]

## 2020-03-19 NOTE — Patient Instructions (Addendum)
Visit Information  Goals Addressed            This Visit's Progress   . BP <140/90       CARE PLAN ENTRY (see longitudinal plan of care for additional care plan information) Current Barriers:  . Current antihypertensive regimen:   Amlodipine 10 mg daily  Chlorthalidone 25 mg daily   Amiodarone 100 mg daily   Potassium 20 meq daily   BP Readings from Last 3 Encounters:  02/13/20 (!) 157/89  01/24/20 (!) 153/94  01/18/20 (!) 148/78   . Current home BP readings: n/a.  Pharmacist Clinical Goal(s):  Marland Kitchen Over the next 180 days, patient will work with PharmD and providers to optimize antihypertensive regimen.  Interventions: . Inter-disciplinary care team collaboration (see longitudinal plan of care). . Comprehensive medication review performed; medication list updated in the electronic medical record.   Patient Self Care Activities:  . Consider checking BP at home 1-2x/week, document, and provide at future appointments. . Patient will focus on medication adherence by continuing current medication management.  Initial goal documentation.     . HR <70       CARE PLAN ENTRY (see longitudinal plan of care for additional care plan information) Current Barriers:  . Current AFib regimen:   Amiodarone 100 mg daily   Pulse Readings from Last 3 Encounters:  02/13/20 64  01/24/20 (!) 52  01/18/20 60   . Current home heart rate readings: n/a.  Pharmacist Clinical Goal(s):  Marland Kitchen Over the next 180 days, patient will work with PharmD and providers to optimize Afib regimen as appropriate.  Interventions: . Inter-disciplinary care team collaboration (see longitudinal plan of care). . Comprehensive medication review performed; medication list updated in the electronic medical record.   Patient Self Care Activities:  . Consider checking pulse at home 1-2x/week, document, and provide at future appointments. . Patient will focus on medication adherence by continuing current  medication management.  Initial goal documentation.     . Total Cholesterol <200       CARE PLAN ENTRY (see longitudinal plan of care for additional care plan information) Current Barriers:  . Current antihyperlipidemic regimen:  o Not able to confirm today. . Previous antihyperlipidemic medications tried:  o Pravastatin, atorvastatin, simvastatin, rosuvastatin, Zetia. . Most recent lipid panel:     Component Value Date/Time   CHOL 253 (H) 10/23/2019 1000   CHOL 263 (H) 04/20/2019 0919   TRIG 138.0 10/23/2019 1000   HDL 43.30 10/23/2019 1000   HDL 45 04/20/2019 0919   CHOLHDL 6 10/23/2019 1000   VLDL 27.6 10/23/2019 1000   LDLCALC 182 (H) 10/23/2019 1000   LDLCALC 197 (H) 04/20/2019 0919   . ASCVD risk enhancing conditions: age >51, HTN.  Pharmacist Clinical Goal(s):  Marland Kitchen Over the next 180 days, patient will work with PharmD and providers towards optimized antihyperlipidemic therapy  Interventions: . Comprehensive medication review performed; medication list updated in electronic medical record.  Patrick Roth care team collaboration (see longitudinal plan of care)  Patient Self Care Activities:  . Patient will focus on medication adherence by continuing current management.   Initial goal documentation.      Patrick Roth was given information about Chronic Care Management services today including:  1. CCM service includes personalized support from designated clinical staff supervised by his physician, including individualized plan of care and coordination with other care providers 2. 24/7 contact phone numbers for assistance for urgent and routine care needs. 3. Standard insurance, coinsurance, copays and  deductibles apply for chronic care management only during months in which we provide at least 20 minutes of these services. Most insurances cover these services at 100%, however patients may be responsible for any copay, coinsurance and/or deductible if applicable.  This service may help you avoid the need for more expensive face-to-face services. 4. Only one practitioner may furnish and bill the service in a calendar month. 5. The patient may stop CCM services at any time (effective at the end of the month) by phone call to the office staff.  Patient agreed to services and verbal consent obtained.   The patient verbalized understanding of instructions provided today and agreed to receive a mailed copy of patient instruction and/or educational materials. Telephone follow up appointment with pharmacy team member scheduled for: See next appointment with "Care Management Staff" under "What's Next" below.   Thank you!  [name], Pharm.D. Clinical Pharmacist [facility] at [facility] [phone] Hypertension, Adult High blood pressure (hypertension) is when the force of blood pumping through the arteries is too strong. The arteries are the blood vessels that carry blood from the heart throughout the body. Hypertension forces the heart to work harder to pump blood and may cause arteries to become narrow or stiff. Untreated or uncontrolled hypertension can cause a heart attack, heart failure, a stroke, kidney disease, and other problems. A blood pressure reading consists of a higher number over a lower number. Ideally, your blood pressure should be below 120/80. The first ("top") number is called the systolic pressure. It is a measure of the pressure in your arteries as your heart beats. The second ("bottom") number is called the diastolic pressure. It is a measure of the pressure in your arteries as the heart relaxes. What are the causes? The exact cause of this condition is not known. There are some conditions that result in or are related to high blood pressure. What increases the risk? Some risk factors for high blood pressure are under your control. The following factors may make you more likely to develop this  condition:  Smoking.  Having type 2 diabetes mellitus, high cholesterol, or both.  Not getting enough exercise or physical activity.  Being overweight.  Having too much fat, sugar, calories, or salt (sodium) in your diet.  Drinking too much alcohol. Some risk factors for high blood pressure may be difficult or impossible to change. Some of these factors include:  Having chronic kidney disease.  Having a family history of high blood pressure.  Age. Risk increases with age.  Race. You may be at higher risk if you are African American.  Gender. Men are at higher risk than women before age 40. After age 103, women are at higher risk than men.  Having obstructive sleep apnea.  Stress. What are the signs or symptoms? High blood pressure may not cause symptoms. Very high blood pressure (hypertensive crisis) may cause:  Headache.  Anxiety.  Shortness of breath.  Nosebleed.  Nausea and vomiting.  Vision changes.  Severe chest pain.  Seizures. How is this diagnosed? This condition is diagnosed by measuring your blood pressure while you are seated, with your arm resting on a flat surface, your legs uncrossed, and your feet flat on the floor. The cuff of the blood pressure monitor will be placed directly against the skin of your upper arm at the level of your heart. It should be measured at least twice using the same arm. Certain conditions can cause a difference in blood  pressure between your right and left arms. Certain factors can cause blood pressure readings to be lower or higher than normal for a short period of time:  When your blood pressure is higher when you are in a health care provider's office than when you are at home, this is called white coat hypertension. Most people with this condition do not need medicines.  When your blood pressure is higher at home than when you are in a health care provider's office, this is called masked hypertension. Most people with  this condition may need medicines to control blood pressure. If you have a high blood pressure reading during one visit or you have normal blood pressure with other risk factors, you may be asked to:  Return on a different day to have your blood pressure checked again.  Monitor your blood pressure at home for 1 week or longer. If you are diagnosed with hypertension, you may have other blood or imaging tests to help your health care provider understand your overall risk for other conditions. How is this treated? This condition is treated by making healthy lifestyle changes, such as eating healthy foods, exercising more, and reducing your alcohol intake. Your health care provider may prescribe medicine if lifestyle changes are not enough to get your blood pressure under control, and if:  Your systolic blood pressure is above 130.  Your diastolic blood pressure is above 80. Your personal target blood pressure may vary depending on your medical conditions, your age, and other factors. Follow these instructions at home: Eating and drinking   Eat a diet that is high in fiber and potassium, and low in sodium, added sugar, and fat. An example eating plan is called the DASH (Dietary Approaches to Stop Hypertension) diet. To eat this way: ? Eat plenty of fresh fruits and vegetables. Try to fill one half of your plate at each meal with fruits and vegetables. ? Eat whole grains, such as whole-wheat pasta, brown rice, or whole-grain bread. Fill about one fourth of your plate with whole grains. ? Eat or drink low-fat dairy products, such as skim milk or low-fat yogurt. ? Avoid fatty cuts of meat, processed or cured meats, and poultry with skin. Fill about one fourth of your plate with lean proteins, such as fish, chicken without skin, beans, eggs, or tofu. ? Avoid pre-made and processed foods. These tend to be higher in sodium, added sugar, and fat.  Reduce your daily sodium intake. Most people with  hypertension should eat less than 1,500 mg of sodium a day.  Do not drink alcohol if: ? Your health care provider tells you not to drink. ? You are pregnant, may be pregnant, or are planning to become pregnant.  If you drink alcohol: ? Limit how much you use to:  0-1 drink a day for women.  0-2 drinks a day for men. ? Be aware of how much alcohol is in your drink. In the [place] one drink equals one 12 oz bottle of beer (355 mL), one 5 oz glass of wine (148 mL), or one 1 oz glass of hard liquor (44 mL). Lifestyle   Work with your health care provider to maintain a healthy body weight or to lose weight. Ask what an ideal weight is for you.  Get at least 30 minutes of exercise most days of the week. Activities may include walking, swimming, or biking.  Include exercise to strengthen your muscles (resistance exercise), such as Pilates or lifting weights, as part of  your weekly exercise routine. Try to do these types of exercises for 30 minutes at least 3 days a week.  Do not use any products that contain nicotine or tobacco, such as cigarettes, e-cigarettes, and chewing tobacco. If you need help quitting, ask your health care provider.  Monitor your blood pressure at home as told by your health care provider.  Keep all follow-up visits as told by your health care provider. This is important. Medicines  Take over-the-counter and prescription medicines only as told by your health care provider. Follow directions carefully. Blood pressure medicines must be taken as prescribed.  Do not skip doses of blood pressure medicine. Doing this puts you at risk for problems and can make the medicine less effective.  Ask your health care provider about side effects or reactions to medicines that you should watch for. Contact a health care provider if you:  Think you are having a reaction to a medicine you are taking.  Have headaches that keep coming back (recurring).  Feel dizzy.  Have  swelling in your ankles.  Have trouble with your vision. Get help right away if you:  Develop a severe headache or confusion.  Have unusual weakness or numbness.  Feel faint.  Have severe pain in your chest or abdomen.  Vomit repeatedly.  Have trouble breathing. Summary  Hypertension is when the force of blood pumping through your arteries is too strong. If this condition is not controlled, it may put you at risk for serious complications.  Your personal target blood pressure may vary depending on your medical conditions, your age, and other factors. For most people, a normal blood pressure is less than 120/80.  Hypertension is treated with lifestyle changes, medicines, or a combination of both. Lifestyle changes include losing weight, eating a healthy, low-sodium diet, exercising more, and limiting alcohol. This information is not intended to replace advice given to you by your health care provider. Make sure you discuss any questions you have with your health care provider. Document Revised: 07/19/2018 Document Reviewed: 07/19/2018 Elsevier Patient Education  2020 [employer].

## 2020-03-20 ENCOUNTER — Telehealth: Payer: Self-pay

## 2020-03-20 ENCOUNTER — Ambulatory Visit: Payer: Medicare Other

## 2020-03-20 ENCOUNTER — Other Ambulatory Visit: Payer: Self-pay

## 2020-03-20 NOTE — Patient Instructions (Addendum)
Visit Information  Goals Addressed   None    Mr. Gasparian was given information about Chronic Care Management services today including:  1. CCM service includes personalized support from designated clinical staff supervised by his physician, including individualized plan of care and coordination with other care providers 2. 24/7 contact phone numbers for assistance for urgent and routine care needs. 3. Standard insurance, coinsurance, copays and deductibles apply for chronic care management only during months in which we provide at least 20 minutes of these services. Most insurances cover these services at 100%, however patients may be responsible for any copay, coinsurance and/or deductible if applicable. This service may help you avoid the need for more expensive face-to-face services. 4. Only one practitioner may furnish and bill the service in a calendar month. 5. The patient may stop CCM services at any time (effective at the end of the month) by phone call to the office staff.  Patient agreed to services and verbal consent obtained.   The patient verbalized understanding of instructions provided today and agreed to receive a mailed copy of patient instruction and/or educational materials. Telephone follow up appointment with pharmacy team member scheduled for: See next appointment with "Care Management Staff" under "What's Next" below.   Thank you!  [name], Pharm.D. Clinical Pharmacist [facility] at [facility] [phone] Hypertension, Adult High blood pressure (hypertension) is when the force of blood pumping through the arteries is too strong. The arteries are the blood vessels that carry blood from the heart throughout the body. Hypertension forces the heart to work harder to pump blood and may cause arteries to become narrow or stiff. Untreated or uncontrolled hypertension can cause a heart attack, heart failure, a stroke, kidney disease, and other  problems. A blood pressure reading consists of a higher number over a lower number. Ideally, your blood pressure should be below 120/80. The first ("top") number is called the systolic pressure. It is a measure of the pressure in your arteries as your heart beats. The second ("bottom") number is called the diastolic pressure. It is a measure of the pressure in your arteries as the heart relaxes. What are the causes? The exact cause of this condition is not known. There are some conditions that result in or are related to high blood pressure. What increases the risk? Some risk factors for high blood pressure are under your control. The following factors may make you more likely to develop this condition:  Smoking.  Having type 2 diabetes mellitus, high cholesterol, or both.  Not getting enough exercise or physical activity.  Being overweight.  Having too much fat, sugar, calories, or salt (sodium) in your diet.  Drinking too much alcohol. Some risk factors for high blood pressure may be difficult or impossible to change. Some of these factors include:  Having chronic kidney disease.  Having a family history of high blood pressure.  Age. Risk increases with age.  Race. You may be at higher risk if you are African American.  Gender. Men are at higher risk than women before age 66. After age 97, women are at higher risk than men.  Having obstructive sleep apnea.  Stress. What are the signs or symptoms? High blood pressure may not cause symptoms. Very high blood pressure (hypertensive crisis) may cause:  Headache.  Anxiety.  Shortness of breath.  Nosebleed.  Nausea and vomiting.  Vision changes.  Severe chest pain.  Seizures. How is this diagnosed? This condition is diagnosed by measuring your  blood pressure while you are seated, with your arm resting on a flat surface, your legs uncrossed, and your feet flat on the floor. The cuff of the blood pressure monitor will be  placed directly against the skin of your upper arm at the level of your heart. It should be measured at least twice using the same arm. Certain conditions can cause a difference in blood pressure between your right and left arms. Certain factors can cause blood pressure readings to be lower or higher than normal for a short period of time:  When your blood pressure is higher when you are in a health care provider's office than when you are at home, this is called white coat hypertension. Most people with this condition do not need medicines.  When your blood pressure is higher at home than when you are in a health care provider's office, this is called masked hypertension. Most people with this condition may need medicines to control blood pressure. If you have a high blood pressure reading during one visit or you have normal blood pressure with other risk factors, you may be asked to:  Return on a different day to have your blood pressure checked again.  Monitor your blood pressure at home for 1 week or longer. If you are diagnosed with hypertension, you may have other blood or imaging tests to help your health care provider understand your overall risk for other conditions. How is this treated? This condition is treated by making healthy lifestyle changes, such as eating healthy foods, exercising more, and reducing your alcohol intake. Your health care provider may prescribe medicine if lifestyle changes are not enough to get your blood pressure under control, and if:  Your systolic blood pressure is above 130.  Your diastolic blood pressure is above 80. Your personal target blood pressure may vary depending on your medical conditions, your age, and other factors. Follow these instructions at home: Eating and drinking   Eat a diet that is high in fiber and potassium, and low in sodium, added sugar, and fat. An example eating plan is called the DASH (Dietary Approaches to Stop Hypertension)  diet. To eat this way: ? Eat plenty of fresh fruits and vegetables. Try to fill one half of your plate at each meal with fruits and vegetables. ? Eat whole grains, such as whole-wheat pasta, brown rice, or whole-grain bread. Fill about one fourth of your plate with whole grains. ? Eat or drink low-fat dairy products, such as skim milk or low-fat yogurt. ? Avoid fatty cuts of meat, processed or cured meats, and poultry with skin. Fill about one fourth of your plate with lean proteins, such as fish, chicken without skin, beans, eggs, or tofu. ? Avoid pre-made and processed foods. These tend to be higher in sodium, added sugar, and fat.  Reduce your daily sodium intake. Most people with hypertension should eat less than 1,500 mg of sodium a day.  Do not drink alcohol if: ? Your health care provider tells you not to drink. ? You are pregnant, may be pregnant, or are planning to become pregnant.  If you drink alcohol: ? Limit how much you use to:  0-1 drink a day for women.  0-2 drinks a day for men. ? Be aware of how much alcohol is in your drink. In the [place] one drink equals one 12 oz bottle of beer (355 mL), one 5 oz glass of wine (148 mL), or one 1 oz glass of hard liquor (  44 mL). Lifestyle   Work with your health care provider to maintain a healthy body weight or to lose weight. Ask what an ideal weight is for you.  Get at least 30 minutes of exercise most days of the week. Activities may include walking, swimming, or biking.  Include exercise to strengthen your muscles (resistance exercise), such as Pilates or lifting weights, as part of your weekly exercise routine. Try to do these types of exercises for 30 minutes at least 3 days a week.  Do not use any products that contain nicotine or tobacco, such as cigarettes, e-cigarettes, and chewing tobacco. If you need help quitting, ask your health care provider.  Monitor your blood pressure at home as told by your health care  provider.  Keep all follow-up visits as told by your health care provider. This is important. Medicines  Take over-the-counter and prescription medicines only as told by your health care provider. Follow directions carefully. Blood pressure medicines must be taken as prescribed.  Do not skip doses of blood pressure medicine. Doing this puts you at risk for problems and can make the medicine less effective.  Ask your health care provider about side effects or reactions to medicines that you should watch for. Contact a health care provider if you:  Think you are having a reaction to a medicine you are taking.  Have headaches that keep coming back (recurring).  Feel dizzy.  Have swelling in your ankles.  Have trouble with your vision. Get help right away if you:  Develop a severe headache or confusion.  Have unusual weakness or numbness.  Feel faint.  Have severe pain in your chest or abdomen.  Vomit repeatedly.  Have trouble breathing. Summary  Hypertension is when the force of blood pumping through your arteries is too strong. If this condition is not controlled, it may put you at risk for serious complications.  Your personal target blood pressure may vary depending on your medical conditions, your age, and other factors. For most people, a normal blood pressure is less than 120/80.  Hypertension is treated with lifestyle changes, medicines, or a combination of both. Lifestyle changes include losing weight, eating a healthy, low-sodium diet, exercising more, and limiting alcohol. This information is not intended to replace advice given to you by your health care provider. Make sure you discuss any questions you have with your health care provider. Document Revised: 07/19/2018 Document Reviewed: 07/19/2018 Elsevier Patient Education  2020 [employer].

## 2020-03-20 NOTE — Progress Notes (Signed)
Duplicate encounter

## 2020-03-20 NOTE — Telephone Encounter (Signed)
   Chronic Care Management   Outreach Note  03/20/2020 Name: JAISEAN KUSNIERZ MRN: [id number] DOB: [birth year]  Referred by: Brunetta Jeans, PA-C Reason for referral : No chief complaint on file.  An unsuccessful telephone outreach was attempted today. The patient was referred to the pharmacist for assistance with care management and care coordination.   Follow Up Plan:  830 am appointment scheduled. If patient returns call, please transfer to pharmacist [phone]) or have patient call pharmacist at [phone] to reschedule.  [name], Pharm.D. Clinical Pharmacist [facility] at [facility] [phone]

## 2020-03-20 NOTE — Progress Notes (Deleted)
Chronic Care Management Pharmacy  Name: Patrick Roth  MRN: [id number] DOB: [birth year]  Chief Complaint/ HPI  Patrick Roth,  74 y.o. , male presents for their Initial CCM visit with the clinical pharmacist via telephone due to COVID-19 Pandemic.  PCP : Brunetta Jeans, PA-C  Their chronic conditions include: HLD, HTN, AFIB, CVA.  Consult Visit:  4/5 (Ortho, Dr. Payton Spark): Pain management; weaning off gabapentin to lose weight   3/4 (ED): Sciatica of left side, was started on tramadol day prior, given Percocet Rx. Reports no resolution with previous prednisone  2/26 (ED): Sciatica of left side, pain 4/10; #1 oxycodone 5-325 and prednisone taper given.  Outpatient Encounter Medications as of 03/20/2020  Medication Sig  . amiodarone (PACERONE) 200 MG tablet Take 0.5 tablets (100 mg total) by mouth daily. (Patient not taking: Reported on 03/20/2020)  . amLODipine (NORVASC) 10 MG tablet Take 10 mg by mouth daily.  . chlorthalidone (HYGROTON) 25 MG tablet Take 1 tablet by mouth once daily  . ELIQUIS 5 MG TABS tablet Take 1 tablet by mouth twice daily  . gabapentin (NEURONTIN) 100 MG capsule Take 1 capsule (100 mg total) by mouth 4 (four) times daily. (Patient taking differently: Take 100 mg by mouth 3 (three) times daily. )  . oxyCODONE-acetaminophen (PERCOCET/ROXICET) 5-325 MG tablet Take 1 tablet by mouth every 4 (four) hours as needed for severe pain.  . potassium chloride SA (KLOR-CON) 20 MEQ tablet Take 1 tablet by mouth in the morning and 2 tablets in the evening  . predniSONE (STERAPRED UNI-PAK 21 TAB) 10 MG (21) TBPK tablet Take by mouth daily. Take 6 tabs by mouth daily  for 1 Day, then 5 tabs for 1 Day, then 4 tabs for 1 Day, then 3 tabs for 1 Day, 2 tabs for 1 Day, then 1 tab by mouth daily for 1 Day (Patient not taking: Reported on 03/20/2020)  . psyllium (REGULOID) 0.52 g capsule Take by mouth.   No facility-administered encounter medications on file as of 03/20/2020.    Current Diagnosis/Assessment: Goals Addressed   None    AFIB   Pulse Readings from Last 3 Encounters:  02/13/20 64  01/24/20 (!) 52  01/18/20 60   Patient is currently rate controlled. HR <70 BPM last several OV. Amiodarone 100 mg daily most recently reported, states no longer is taking. Denies dizziness, SOB, chest pain.  Taking Eliquis 5 mg twice daily for prevention of stroke in afib. Denies any side effects at this time. We discussed: patient assistance for Eliquis - see Medication Management a/p.  Plan Continue current medications.  Hypertension   Office blood pressures are  BP Readings from Last 3 Encounters:  02/13/20 (!) 157/89  01/24/20 (!) 153/94  01/18/20 (!) 148/78   Patient currently taking the following medications:   Amlodipine 10 mg daily  Chlorthalidone 25 mg daily   Amiodarone 100 mg daily   Potassium 20 meq daily   BP has been borderline high. Expressed preference and position on not wanting to add/inc. any medications at this time. Denies dizziness, SOB, chest pain.  Patient checks BP at home: n/a; did not discuss.  Patient home BP readings are ranging: n/a; did not discuss.  Plan Continue current medications. Will continue to monitor BP.    Pain  Patient is currently controlled on the following medications: oxycodone 5-325 mg, gabapentin 300 mg daily. Is in the processes of weaning off of gabapentin due to weight gain. No side additional side effects  reported at this time.  Plan Continue current medications.  Hyperlipidemia   Lipid Panel     Component Value Date/Time   CHOL 253 (H) 10/23/2019 1000   CHOL 263 (H) 04/20/2019 0919   TRIG 138.0 10/23/2019 1000   HDL 43.30 10/23/2019 1000   HDL 45 04/20/2019 0919   CHOLHDL 6 10/23/2019 1000   VLDL 27.6 10/23/2019 1000   LDLCALC 182 (H) 10/23/2019 1000   LDLCALC 197 (H) 04/20/2019 0919   LABVLDL 21 04/20/2019 0919    Patient mentions taking 2.5 tabs of cholesterol medication, we  were not able to confirm medication/dosing today. Hx of statin intolerance. TG normal, LDL elevated at last visit 10/2020.   Plan Continue current management, will continue to monitor.   Vaccines  Reviewed vaccination history. Due for Shingrix and PNA if wanting to consider. PNA previously declined AMA.  Immunization History  Administered Date(s) Administered  . Influenza,inj,Quad PF,6+ Mos 07/23/2015  . Influenza-Unspecified 07/23/2018  . Tdap 07/05/2013   Plan Consider receiving PNA/Shingrix vaccine in office/pharmacy.   Medication Management  Pt uses [facility] for all medications.   Mentions growing concern of eventually having to make choice b/w medications and food. Declined wanting to receive food from local food drive programs, including [employer]. States still being able to eat what he wants at this time.  We discussed patient assistance programs for Eliquis. Mentions out of pocket cost will increase to >$300 in coverage gap.  Plan Continue current medication management strategy. Will review patient assistance program and work with patient to apply if we are able to.  Follow up: 6 month phone visit or sooner for PAP.  ______________ Visit Information SDOH (Social Determinants of Health) assessments performed: Yes.   Food Insecurity: No Food Insecurity  . Worried About Charity fundraiser in the Last Year: Never true  . Ran Out of Food in the Last Year: Never true     Transportation Needs: No Transportation Needs  . Lack of Transportation (Medical): No  . Lack of Transportation (Non-Medical): No   Mr. Child was given information about Chronic Care Management services today including:  1. CCM service includes personalized support from designated clinical staff supervised by his physician, including individualized plan of care and coordination with other care providers 2. 24/7 contact phone numbers for assistance for urgent and routine care  needs. 3. Standard insurance, coinsurance, copays and deductibles apply for chronic care management only during months in which we provide at least 20 minutes of these services. Most insurances cover these services at 100%, however patients may be responsible for any copay, coinsurance and/or deductible if applicable. This service may help you avoid the need for more expensive face-to-face services. 4. Only one practitioner may furnish and bill the service in a calendar month. 5. The patient may stop CCM services at any time (effective at the end of the month) by phone call to the office staff.  Patient agreed to services and verbal consent obtained.   [name], Pharm.D. Clinical Pharmacist [facility] at [facility] [phone]

## 2020-05-05 ENCOUNTER — Ambulatory Visit: Payer: Medicare Other | Admitting: Physician Assistant

## 2020-05-06 ENCOUNTER — Encounter: Payer: Self-pay | Admitting: Physician Assistant

## 2020-05-06 ENCOUNTER — Other Ambulatory Visit: Payer: Self-pay

## 2020-05-06 ENCOUNTER — Ambulatory Visit (INDEPENDENT_AMBULATORY_CARE_PROVIDER_SITE_OTHER): Payer: Medicare Other | Admitting: Physician Assistant

## 2020-05-06 VITALS — BP 130/70 | HR 54 | Temp 97.0°F | Ht 68.0 in | Wt 207.2 lb

## 2020-05-06 DIAGNOSIS — L723 Sebaceous cyst: Secondary | ICD-10-CM

## 2020-05-06 NOTE — Patient Instructions (Signed)
I am setting you up with Dermatology for removal of the cystic lesion since it is bothersome for you.  If you do not hear from the specialist within 10 days at most, please let me know.   If you note anything changing about the area in the meantime, please let me know.   Hang in there!

## 2020-05-06 NOTE — Progress Notes (Signed)
Patient presents to clinic today c/o sore knot on his R shoulder, present for a couple of months. Has only recently become slightly sore to the touch. Otherwise no pain. Does not feel it has gotten larger. Denies any trauma or injury to the area.   Past Medical History:  Diagnosis Date  . Atrial flutter ([place])   . Colon polyps   . Constipation 05/27/2015  . DOE (dyspnea on exertion) 10/13/2015  . Faintness 06/20/2016  . History of chicken pox   . Hyperlipemia 02/13/2016  . Hyperlipidemia   . Hypertension   . Hypokalemia 06/28/2016  . Hyponatremia 06/28/2016  . Internal hemorrhoids   . Irregular heartbeat   . Malignant hypertension 07/15/2015  . Sciatica   . Stroke [facility]) 2008  . Syncope and collapse 06/28/2016    Current Outpatient Medications on File Prior to Visit  Medication Sig Dispense Refill  . amLODipine (NORVASC) 10 MG tablet Take 10 mg by mouth daily.    . chlorthalidone (HYGROTON) 25 MG tablet Take 1 tablet by mouth once daily 90 tablet 1  . ELIQUIS 5 MG TABS tablet Take 1 tablet by mouth twice daily 60 tablet 5  . gabapentin (NEURONTIN) 100 MG capsule Take 1 capsule (100 mg total) by mouth 4 (four) times daily. (Patient taking differently: Take 100 mg by mouth 3 (three) times daily. ) 360 capsule 1  . oxyCODONE-acetaminophen (PERCOCET/ROXICET) 5-325 MG tablet Take 1 tablet by mouth every 4 (four) hours as needed for severe pain. 12 tablet 0  . potassium chloride SA (KLOR-CON) 20 MEQ tablet Take 1 tablet by mouth in the morning and 2 tablets in the evening 90 tablet 3  . psyllium (REGULOID) 0.52 g capsule Take by mouth.    Marland Kitchen amiodarone (PACERONE) 200 MG tablet Take 0.5 tablets (100 mg total) by mouth daily. (Patient not taking: Reported on 03/20/2020) 45 tablet 3   No current facility-administered medications on file prior to visit.    Allergies  Allergen Reactions  . Crestor [Rosuvastatin Calcium] Shortness Of Breath    Muscle cramps  . Spironolactone Shortness Of Breath  .  Terazosin Shortness Of Breath, Palpitations and Other (See Comments)    Nerve pain  . Atenolol Other (See Comments)    Drowsiness and "flu sxs"  . Bystolic [Nebivolol Hcl] Other (See Comments)    GI issues  . Hydralazine Other (See Comments)    Joint swelling  . Tizanidine Other (See Comments)    Syncope, Elevated BP/pulse  . Amlodipine Other (See Comments)  . Clonidine Other (See Comments)  . Hydrocodone-Acetaminophen Other (See Comments)  . Levofloxacin Other (See Comments)  . Losartan Other (See Comments)    Did not work.  Pt states he couldn't take b/c of SE, but can't remember what SE were.  . Maxzide [Hydrochlorothiazide W-Triamterene] Other (See Comments)    Does not tolerate potassium-sparing diuretics  . Olmesartan Other (See Comments)  . Tape Other (See Comments)    Other reaction(s): Unknown  . Tussionex Pennkinetic Er [Hydrocod Polst-Cpm Polst Er] Other (See Comments)    Other reaction(s): Unknown    Family History  Problem Relation Age of Onset  . Hypertension Father 67       Deceased  . Heart disease Father   . Lung cancer Mother 29       Deceased  . Healthy Sister        x3  . Healthy Brother        x4  . Heart disease  Brother        #5  . Other Daughter        Alpha Thalassemia  . Lupus Daughter        #2-deceased  . Diabetes Neg Hx   . Heart attack Neg Hx   . Hyperlipidemia Neg Hx   . Sudden death Neg Hx     Social History   Socioeconomic History  . Marital status: Married    Spouse name: Not on file  . Number of children: 1  . Years of education: 26  . Highest education level: Not on file  Occupational History  . Occupation: Retired  Tobacco Use  . Smoking status: Never Smoker  . Smokeless tobacco: Never Used  Vaping Use  . Vaping Use: Never used  Substance and Sexual Activity  . Alcohol use: No  . Drug use: No  . Sexual activity: Not on file  Other Topics Concern  . Not on file  Social History Narrative   Fun: Play golf and  build furniture.    Social Determinants of Health   Financial Resource Strain:   . Difficulty of Paying Living Expenses:   Food Insecurity: No Food Insecurity  . Worried About Charity fundraiser in the Last Year: Never true  . Ran Out of Food in the Last Year: Never true  Transportation Needs: No Transportation Needs  . Lack of Transportation (Medical): No  . Lack of Transportation (Non-Medical): No  Physical Activity:   . Days of Exercise per Week:   . Minutes of Exercise per Session:   Stress:   . Feeling of Stress :   Social Connections:   . Frequency of Communication with Friends and Family:   . Frequency of Social Gatherings with Friends and Family:   . Attends Religious Services:   . Active Member of Clubs or Organizations:   . Attends Archivist Meetings:   Marland Kitchen Marital Status:     Review of Systems - See HPI.  All other ROS are negative.  BP 130/70   Pulse (!) 54   Temp (!) 97 F (36.1 C)   Ht 5\' 8"  (1.727 m)   Wt 207 lb 3.2 oz (94 kg)   SpO2 99%   BMI 31.50 kg/m   Physical Exam Vitals reviewed.  Constitutional:      Appearance: Normal appearance.  HENT:     Head: Normocephalic and atraumatic.  Pulmonary:     Effort: Pulmonary effort is normal.  Skin:      Neurological:     General: No focal deficit present.     Mental Status: He is alert and oriented to person, place, and time.     Recent Results (from the past 2160 hour(s))  CBC with Differential (Cancer Center Only)     Status: Abnormal   Collection Time: 02/13/20  9:24 AM  Result Value Ref Range   WBC Count 4.9 4.0 - 10.5 K/uL   RBC 6.18 (H) 4.22 - 5.81 MIL/uL   Hemoglobin 17.1 (H) 13.0 - 17.0 g/dL   HCT 53.8 (H) 39 - 52 %   MCV 87.1 80.0 - 100.0 fL   MCH 27.7 26.0 - 34.0 pg   MCHC 31.8 30.0 - 36.0 g/dL   RDW 15.5 11.5 - 15.5 %   Platelet Count 182 150 - 400 K/uL   nRBC 0.0 0.0 - 0.2 %   Neutrophils Relative % 50 %   Neutro Abs 2.5 1.7 - 7.7 K/uL   Lymphocytes  Relative 29 %    Lymphs Abs 1.4 0.7 - 4.0 K/uL   Monocytes Relative 18 %   Monocytes Absolute 0.9 0 - 1 K/uL   Eosinophils Relative 1 %   Eosinophils Absolute 0.1 0 - 0 K/uL   Basophils Relative 1 %   Basophils Absolute 0.0 0 - 0 K/uL   Immature Granulocytes 1 %   Abs Immature Granulocytes 0.03 0.00 - 0.07 K/uL    Comment: Performed at [facility], [place] [address], [place], [place] [address]  CMP ([place] only)     Status: Abnormal   Collection Time: 02/13/20  9:24 AM  Result Value Ref Range   Sodium 140 135 - 145 mmol/L   Potassium 3.6 3.5 - 5.1 mmol/L   Chloride 101 98 - 111 mmol/L   CO2 31 22 - 32 mmol/L   Glucose, Bld 91 70 - 99 mg/dL    Comment: Glucose reference range applies only to samples taken after fasting for at least 8 hours.   BUN 18 8 - 23 mg/dL   Creatinine 1.37 (H) 0.61 - 1.24 mg/dL   Calcium 9.3 8.9 - 10.3 mg/dL   Total Protein 7.6 6.5 - 8.1 g/dL   Albumin 3.9 3.5 - 5.0 g/dL   AST 26 15 - 41 U/L   ALT 29 0 - 44 U/L   Alkaline Phosphatase 70 38 - 126 U/L   Total Bilirubin 1.4 (H) 0.3 - 1.2 mg/dL   GFR, Est Non Af Am 50 (L) >60 mL/min   GFR, Est AFR Am 58 (L) >60 mL/min   Anion gap 8 5 - 15    Comment: Performed at [facility], [place] [address], [place], [place] [address]  Angiotensin converting enzyme     Status: None   Collection Time: 02/13/20  9:24 AM  Result Value Ref Range   Angiotensin-Converting Enzyme 57 14 - 82 U/L    Comment: (NOTE) Performed At: [facility] [place], [place] [id number] Rush Farmer MD EY:[id number]   Lactate dehydrogenase (LDH)     Status: Abnormal   Collection Time: 02/13/20  9:24 AM  Result Value Ref Range   LDH 273 (H) 98 - 192 U/L    Comment: Performed at [facility], [place] [address], [place], [place] [address]   Assessment/Plan: 1. Sebaceous cyst Not infected. Seb cyst most likely giving texture of the palpable lesion. Will refer to  Dermatology for removal as patient requests.  - Ambulatory referral to Dermatology   This visit occurred during the SARS-CoV-2 public health emergency.  Safety protocols were in place, including screening questions prior to the visit, additional usage of staff PPE, and extensive cleaning of exam room while observing appropriate contact time as indicated for disinfecting solutions.     Leeanne Rio, PA-C

## 2020-05-15 ENCOUNTER — Inpatient Hospital Stay: Payer: Medicare Other | Attending: Hematology and Oncology | Admitting: Hematology and Oncology

## 2020-05-15 ENCOUNTER — Other Ambulatory Visit: Payer: Self-pay

## 2020-05-15 ENCOUNTER — Other Ambulatory Visit: Payer: Self-pay | Admitting: Hematology and Oncology

## 2020-05-15 ENCOUNTER — Inpatient Hospital Stay: Payer: Medicare Other

## 2020-05-15 VITALS — BP 136/79 | HR 61 | Temp 97.5°F | Resp 18 | Ht 68.0 in | Wt 209.9 lb

## 2020-05-15 DIAGNOSIS — Z8673 Personal history of transient ischemic attack (TIA), and cerebral infarction without residual deficits: Secondary | ICD-10-CM | POA: Insufficient documentation

## 2020-05-15 DIAGNOSIS — D72821 Monocytosis (symptomatic): Secondary | ICD-10-CM | POA: Insufficient documentation

## 2020-05-15 DIAGNOSIS — D751 Secondary polycythemia: Secondary | ICD-10-CM | POA: Insufficient documentation

## 2020-05-15 DIAGNOSIS — N400 Enlarged prostate without lower urinary tract symptoms: Secondary | ICD-10-CM | POA: Diagnosis not present

## 2020-05-15 DIAGNOSIS — E785 Hyperlipidemia, unspecified: Secondary | ICD-10-CM | POA: Diagnosis not present

## 2020-05-15 DIAGNOSIS — Z79899 Other long term (current) drug therapy: Secondary | ICD-10-CM | POA: Insufficient documentation

## 2020-05-15 DIAGNOSIS — D72819 Decreased white blood cell count, unspecified: Secondary | ICD-10-CM | POA: Diagnosis present

## 2020-05-15 DIAGNOSIS — I4891 Unspecified atrial fibrillation: Secondary | ICD-10-CM | POA: Insufficient documentation

## 2020-05-15 DIAGNOSIS — Z7901 Long term (current) use of anticoagulants: Secondary | ICD-10-CM | POA: Diagnosis not present

## 2020-05-15 DIAGNOSIS — D709 Neutropenia, unspecified: Secondary | ICD-10-CM

## 2020-05-15 DIAGNOSIS — D869 Sarcoidosis, unspecified: Secondary | ICD-10-CM

## 2020-05-15 DIAGNOSIS — I10 Essential (primary) hypertension: Secondary | ICD-10-CM | POA: Insufficient documentation

## 2020-05-15 LAB — CBC WITH DIFFERENTIAL (CANCER CENTER ONLY)
Abs Immature Granulocytes: 0.01 10*3/uL (ref 0.00–0.07)
Basophils Absolute: 0 10*3/uL (ref 0.0–0.1)
Basophils Relative: 1 %
Eosinophils Absolute: 0.1 10*3/uL (ref 0.0–0.5)
Eosinophils Relative: 2 %
HCT: 49.6 % (ref 39.0–52.0)
Hemoglobin: 16 g/dL (ref 13.0–17.0)
Immature Granulocytes: 0 %
Lymphocytes Relative: 34 %
Lymphs Abs: 1.2 10*3/uL (ref 0.7–4.0)
MCH: 28.3 pg (ref 26.0–34.0)
MCHC: 32.3 g/dL (ref 30.0–36.0)
MCV: 87.6 fL (ref 80.0–100.0)
Monocytes Absolute: 0.8 10*3/uL (ref 0.1–1.0)
Monocytes Relative: 24 %
Neutro Abs: 1.4 10*3/uL — ABNORMAL LOW (ref 1.7–7.7)
Neutrophils Relative %: 39 %
Platelet Count: 163 10*3/uL (ref 150–400)
RBC: 5.66 MIL/uL (ref 4.22–5.81)
RDW: 13.1 % (ref 11.5–15.5)
WBC Count: 3.5 10*3/uL — ABNORMAL LOW (ref 4.0–10.5)
nRBC: 0 % (ref 0.0–0.2)

## 2020-05-15 LAB — CMP (CANCER CENTER ONLY)
ALT: 18 U/L (ref 0–44)
AST: 22 U/L (ref 15–41)
Albumin: 3.6 g/dL (ref 3.5–5.0)
Alkaline Phosphatase: 76 U/L (ref 38–126)
Anion gap: 9 (ref 5–15)
BUN: 10 mg/dL (ref 8–23)
CO2: 29 mmol/L (ref 22–32)
Calcium: 9.2 mg/dL (ref 8.9–10.3)
Chloride: 103 mmol/L (ref 98–111)
Creatinine: 1.27 mg/dL — ABNORMAL HIGH (ref 0.61–1.24)
GFR, Est AFR Am: >60 mL/min (ref >60)
GFR, Estimated: 55 mL/min — ABNORMAL LOW (ref >60)
Glucose, Bld: 87 mg/dL (ref 70–99)
Potassium: 3.2 mmol/L — ABNORMAL LOW (ref 3.5–5.1)
Sodium: 141 mmol/L (ref 135–145)
Total Bilirubin: 1.5 mg/dL — ABNORMAL HIGH (ref 0.3–1.2)
Total Protein: 7.1 g/dL (ref 6.5–8.1)

## 2020-05-16 ENCOUNTER — Telehealth: Payer: Self-pay | Admitting: Hematology and Oncology

## 2020-05-16 NOTE — Telephone Encounter (Signed)
Scheduled per los. Called and left msg. Mailed printout  °

## 2020-05-18 NOTE — Progress Notes (Signed)
[place] Telephone:([phone]) [phone]   Fax:([phone]) [phone]  PROGRESS NOTE  Patient Care Team: Delorse Limber as PCP - General (Physician Assistant) Constance Haw, MD as PCP - Cardiology (Cardiology) Irene Shipper, MD as Consulting Physician (Gastroenterology) [name], Wonda Cheng, MD as Consulting Physician (Cardiology) Christy Sartorius, MD as Referring Physician (Urology) Inocencio Homes, DPM as Consulting Physician (Podiatry) Myrle Sheng, MD as Referring Physician (Neurosurgery) Begovich, Geradine Girt, DO (Sports Medicine) [name], [facility] as Pharmacist (Pharmacist)  Hematological/Oncological History #Leukopenia with Relative Monocytosis and Neutropenia #Polycythemia #Sarcoidosis in the Bone Marrow  1) 08/03/2017: WBC 4.3, Hgb 16.4, Plt 164 2) 08/11/2018: WBC 3.0, Hgb 18.1, Plt 176. ANC 1.2 3) 10/23/2019: WBC 2.7, Hgb 17.3, Plt 160. ANC 1.2. 20.9% Monocytes (Total Monocytes 600) 4) 11/14/2019: establish care with Dr. Lorenso Courier. WBC 2.6, Hgb 16.9, Plt 160. ANC 1200 5) 01/02/2020: WBC 2.8,  Hgb 16.4, MCV 86.5, Plt 164 6) 01/15/2020: Bone marrow biopsy performed which revealed normocellular marrow with mild megakaryocytic dysplasia and noncaseating granulomas (consistent with sarcoidosis).  7) 02/27/2020: CT C/A/P shows no evidence of sarcoid elsewhere in the body.   Interval History:  Patrick Roth 74 y.o. male with medical history significant for neutropenia and polycythemia presents for a follow up visit. The patient's last visit was on 02/13/2020. In the interim since the last visit he has had a CT scan of the chest which showed no sarcoid elsewhere in the body.  On exam today Patrick Roth notes that he feels well.  Reports that he has a bump on his right shoulder which she is having evaluated by dermatology next week.  He reports that since last time we saw him he has had no infectious symptoms and denies having any issues with fevers, chills, sweats, nausea,  vomiting or diarrhea.  He reports that he did have one single episode of diarrhea which occurred minutes after eating a meal at [facility].  He reports that he has not eaten at that [facility] since and he has not had any further GI distress.  He notes otherwise no shortness of breath, chest pain, or other symptoms.  A full 10 point ROS is listed below.  MEDICAL HISTORY:  Past Medical History:  Diagnosis Date  . Atrial flutter ([place])   . Colon polyps   . Constipation 05/27/2015  . DOE (dyspnea on exertion) 10/13/2015  . Faintness 06/20/2016  . History of chicken pox   . Hyperlipemia 02/13/2016  . Hyperlipidemia   . Hypertension   . Hypokalemia 06/28/2016  . Hyponatremia 06/28/2016  . Internal hemorrhoids   . Irregular heartbeat   . Malignant hypertension 07/15/2015  . Sciatica   . Stroke [facility]) 2008  . Syncope and collapse 06/28/2016   ALLERGIES:  is allergic to crestor [rosuvastatin calcium], spironolactone, terazosin, atenolol, bystolic [nebivolol hcl], hydralazine, tizanidine, amlodipine, clonidine, hydrocodone-acetaminophen, levofloxacin, losartan, maxzide [hydrochlorothiazide w-triamterene], olmesartan, tape, and tussionex pennkinetic er [employer] er].  MEDICATIONS:  Current Outpatient Medications  Medication Sig Dispense Refill  . amLODipine (NORVASC) 10 MG tablet Take 10 mg by mouth daily.    . chlorthalidone (HYGROTON) 25 MG tablet Take 1 tablet by mouth once daily 90 tablet 1  . ELIQUIS 5 MG TABS tablet Take 1 tablet by mouth twice daily 60 tablet 5  . gabapentin (NEURONTIN) 100 MG capsule Take 1 capsule (100 mg total) by mouth 4 (four) times daily. (Patient taking differently: Take 100 mg by mouth 3 (three) times daily. ) 360 capsule  1  . oxyCODONE-acetaminophen (PERCOCET/ROXICET) 5-325 MG tablet Take 1 tablet by mouth every 4 (four) hours as needed for severe pain. 12 tablet 0  . potassium chloride SA (KLOR-CON) 20 MEQ tablet Take 1 tablet by mouth in the morning and 2 tablets in  the evening 90 tablet 3  . psyllium (REGULOID) 0.52 g capsule Take by mouth.     No current facility-administered medications for this visit.    REVIEW OF SYSTEMS:   Constitutional: ( - ) fevers, ( - )  chills , ( - ) night sweats Eyes: ( - ) blurriness of vision, ( - ) double vision, ( - ) watery eyes Ears, nose, mouth, throat, and face: ( - ) mucositis, ( - ) sore throat Respiratory: ( - ) cough, ( - ) dyspnea, ( - ) wheezes Cardiovascular: ( - ) palpitation, ( - ) chest discomfort, ( - ) lower extremity swelling Gastrointestinal:  ( - ) nausea, ( - ) heartburn, ( - ) change in bowel habits Skin: ( - ) abnormal skin rashes Lymphatics: ( - ) new lymphadenopathy, ( - ) easy bruising Neurological: ( - ) numbness, ( - ) tingling, ( - ) new weaknesses Behavioral/Psych: ( - ) mood change, ( - ) new changes  All other systems were reviewed with the patient and are negative.  PHYSICAL EXAMINATION: ECOG PERFORMANCE STATUS: 1 - Symptomatic but completely ambulatory  Vitals:   05/15/20 1042  BP: 136/79  Pulse: 61  Resp: 18  Temp: (!) 97.5 F (36.4 C)  SpO2: 100%   Filed Weights   05/15/20 1042  Weight: 209 lb 14.4 oz (95.2 kg)    GENERAL: well appearing elderly African American male in NAD  SKIN: skin color, texture, turgor are normal, no rashes or significant lesions. Lipoma on right lower portion of neck.  EYES: conjunctiva are pink and non-injected, sclera clear LUNGS: clear to auscultation and percussion with normal breathing effort HEART: regular rate & rhythm and no murmurs and no lower extremity edema ABDOMEN: soft, non-tender, non-distended, normal bowel sounds Musculoskeletal: no cyanosis of digits and no clubbing  PSYCH: alert & oriented x 3, fluent speech NEURO: no focal motor/sensory deficits  LABORATORY DATA:  I have reviewed the data as listed CBC Latest Ref Rng & Units 05/15/2020 02/13/2020 01/15/2020  WBC 4.0 - 10.5 K/uL 3.5(L) 4.9 2.9(L)  Hemoglobin 13.0 - 17.0  g/dL 16.0 17.1(H) 15.3  Hematocrit 39 - 52 % 49.6 53.8(H) 47.4  Platelets 150 - 400 K/uL 163 182 140(L)    CMP Latest Ref Rng & Units 05/15/2020 02/13/2020 01/02/2020  Glucose 70 - 99 mg/dL 87 91 106(H)  BUN 8 - 23 mg/dL _0 Creatinine 0.61 - 1.24 mg/dL 1.27(H) 1.37(H) 1.36(H)  Sodium 135 - 145 mmol/L 141 140 144  Potassium 3.5 - 5.1 mmol/L 3.2(L) 3.6 3.2(L)  Chloride 98 - 111 mmol/L 103 101 105  CO2 22 - 32 mmol/L _1 Calcium 8.9 - 10.3 mg/dL 9.2 9.3 8.9  Total Protein 6.5 - 8.1 g/dL 7.1 7.6 7.5  Total Bilirubin 0.3 - 1.2 mg/dL 1.5(H) 1.4(H) 1.0  Alkaline Phos 38 - 126 U/L 76 70 74  AST 15 - 41 U/L _2 ALT 0 - 44 U/L _3 BLOOD FILM:  From prior visit 01/01/2019: Review of the peripheral blood smear showed normal appearing white cells (though markedly decreased in number) with neutrophils that were appropriately lobated and granulated.  There was no predominance of bi-lobed or hyper-segmented neutrophils appreciated. No Dohle bodies were noted. There was no left shifting, immature forms or blasts noted. Lymphocytes remain normal in size without any predominance of large granular lymphocytes. Red cells show no anisopoikilocytosis, macrocytes , microcytes or polychromasia. There were no schistocytes, target cells, echinocytes, acanthocytes, dacrocytes, or stomatocytes.There was no rouleaux formation, nucleated red cells, or intra-cellular inclusions noted. The platelets are normal in size, shape, and color without any clumping evident.  RADIOGRAPHIC STUDIES: None relevant to review.  No results found.  ASSESSMENT & PLAN Patrick Roth 74 y.o. male with medical history significant for stroke, HTN, HLD, and atrial fibrillation on apixaban who presents for f/u of leukopenia with relative monocytosis.   After the previous visit no etiology was found for the patient's neutropenia.  Bone marrow biopsy has been performed in the interim which shows non because eating  granulomas consistent with sarcoidosis of the bone marrow.  Also of note the patient had a normocellular marrow with mild megakaryocytic dysplasia.  These findings are nonspecific, though it is possible that the patient has bone marrow involvement of sarcoidosis which is driving his leukopenia.  On exam today the patient notes that he is asymptomatic and he feels well.  Prior imaging including ultrasound of the abdomen as well as chest x-ray have shown no concerning imaging, but most of these are several years old.  After discussion [place] conference we decided to pursue a CT scan in order to determine if there is sarcoidosis elsewhere, predominantly we are concerned about the lungs. CT scan performed on 02/27/2020 showed no evidence of sarcoid in the C/A/P. There was a 5 mm nodule noted in the lungs with 12 month interval imaging recommended.   Of note during his prior visit  the patient's white blood cell count returned to normal.  This is most likely due to a steroid taper he was put on for hip pain.  Today his WBC have returned to baseline at 3.5 with an [place] 1400.   #Leukopenia with Relative Monocytosis and Neutropenia #Polycythemia, Likely Secondary  #Sarcoidosis Involvement of the Bone Marrow --bone marrow biopsy shows normocellular marrow with mild megakaryocytic dysplasia as well as noncaseating granulomas. These findings are concerning for sarcoidosis infiltration of the bone marrow.  --WBC at baseline today, WBC 3.5 with ANC 1400 --continue to monitor his blood counts. No clear indication for treatment of sarcoidosis of the marrow at this time as it appears stable.  --CT imaging on 02/27/2020 of the C/A/P showed no evidence of sarcoidosis elsewhere.  --RTC in 6 months to repeat bloodwork  #Elevated PSA --patient is currently being evaluated by urology. --MRI prostate performed yesterday shows BPH but no discrete lesions --defer to urology, though we can assist if carcinoma of the prostate is  found.   No orders of the defined types were placed in this encounter.  All questions were answered. The patient knows to call the clinic with any problems, questions or concerns.  A total of more than 30 minutes were spent on this encounter and over half of that time was spent on counseling and coordination of care as outlined above.   Patrick Peoples, MD Department of Hematology/Oncology [place] [facility] Phone: [phone] Pager: [phone] Email: [email or web address]  05/18/2020 5:36 PM

## 2020-07-01 ENCOUNTER — Other Ambulatory Visit: Payer: Self-pay | Admitting: Physician Assistant

## 2020-07-07 ENCOUNTER — Other Ambulatory Visit: Payer: Self-pay | Admitting: Physician Assistant

## 2020-07-07 NOTE — Telephone Encounter (Signed)
Patient last CMP was on 05/15/20, potassium low at 3.2 Is this okay to fill as previously taking, 1 tab in the am and 2 tabs in the pm. Please advise.

## 2020-07-07 NOTE — Telephone Encounter (Signed)
Left a message with the patient's wife for him to call the office.

## 2020-07-14 ENCOUNTER — Ambulatory Visit (INDEPENDENT_AMBULATORY_CARE_PROVIDER_SITE_OTHER): Payer: Medicare HMO | Admitting: Physician Assistant

## 2020-07-14 ENCOUNTER — Encounter: Payer: Self-pay | Admitting: Physician Assistant

## 2020-07-14 ENCOUNTER — Other Ambulatory Visit: Payer: Self-pay

## 2020-07-14 VITALS — BP 128/80 | HR 59 | Temp 97.7°F | Resp 16 | Ht 68.0 in | Wt 214.0 lb

## 2020-07-14 DIAGNOSIS — R609 Edema, unspecified: Secondary | ICD-10-CM | POA: Diagnosis not present

## 2020-07-14 MED ORDER — FUROSEMIDE 20 MG PO TABS: 20.0000 mg | ORAL_TABLET | Freq: Every day | ORAL | 0 refills | Status: DC

## 2020-07-14 NOTE — Patient Instructions (Signed)
Please keep hydrated. Limit salt -- follow the diet below. Keep legs elevated while resting. Wear the compression stocking daily.   Continue your Chlorthalidone and potassium supplement. Start the Furosemide once daily for the next 3 days, then just as needed. Follow-up in 7-10 days for reassessment.  Hang in there!

## 2020-07-14 NOTE — Progress Notes (Signed)
Patient presents to clinic today c/o swelling of bilateral lower extremities over the past 2 weeks, R > L. Notes swelling of mainly feet and distal lower leg. Denies pain, redness, warmth or tenderness. Denies any chest congestion, SOB, PND or orthopnea. Endorses taking his Chlorthalidone and amlodipine as directed along with potassium supplement and Eliquis. Did recently go on vacation but notes symptoms were present before this.   Past Medical History:  Diagnosis Date  . Atrial flutter ([place])   . Colon polyps   . Constipation 05/27/2015  . DOE (dyspnea on exertion) 10/13/2015  . Faintness 06/20/2016  . History of chicken pox   . Hyperlipemia 02/13/2016  . Hyperlipidemia   . Hypertension   . Hypokalemia 06/28/2016  . Hyponatremia 06/28/2016  . Internal hemorrhoids   . Irregular heartbeat   . Malignant hypertension 07/15/2015  . Sciatica   . Stroke [facility]) 2008  . Syncope and collapse 06/28/2016    Current Outpatient Medications on File Prior to Visit  Medication Sig Dispense Refill  . amLODipine (NORVASC) 10 MG tablet Take 10 mg by mouth daily.    . chlorthalidone (HYGROTON) 25 MG tablet Take 1 tablet by mouth once daily 90 tablet 0  . ELIQUIS 5 MG TABS tablet Take 1 tablet by mouth twice daily 60 tablet 5  . gabapentin (NEURONTIN) 100 MG capsule Take 1 capsule (100 mg total) by mouth 4 (four) times daily. (Patient taking differently: Take 100 mg by mouth 3 (three) times daily. ) 360 capsule 1  . oxyCODONE-acetaminophen (PERCOCET/ROXICET) 5-325 MG tablet Take 1 tablet by mouth every 4 (four) hours as needed for severe pain. 12 tablet 0  . potassium chloride SA (KLOR-CON M20) 20 MEQ tablet TAKE 1 TABLET BY MOUTH IN THE MORNING AND 2 IN THE EVENING (Patient taking differently: TAKE 1.5 TABLET BY MOUTH IN THE MORNING AND 1 IN THE EVENING) 90 tablet 0  . psyllium (REGULOID) 0.52 g capsule Take by mouth.     No current facility-administered medications on file prior to visit.    Allergies    Allergen Reactions  . Crestor [Rosuvastatin Calcium] Shortness Of Breath    Muscle cramps  . Spironolactone Shortness Of Breath  . Terazosin Shortness Of Breath, Palpitations and Other (See Comments)    Nerve pain  . Atenolol Other (See Comments)    Drowsiness and "flu sxs"  . Bystolic [Nebivolol Hcl] Other (See Comments)    GI issues  . Hydralazine Other (See Comments)    Joint swelling  . Tizanidine Other (See Comments)    Syncope, Elevated BP/pulse  . Amlodipine Other (See Comments)  . Clonidine Other (See Comments)  . Hydrocodone-Acetaminophen Other (See Comments)  . Levofloxacin Other (See Comments)  . Losartan Other (See Comments)    Did not work.  Pt states he couldn't take b/c of SE, but can't remember what SE were.  . Maxzide [Hydrochlorothiazide W-Triamterene] Other (See Comments)    Does not tolerate potassium-sparing diuretics  . Olmesartan Other (See Comments)  . Tape Other (See Comments)    Other reaction(s): Unknown  . Tussionex Pennkinetic Er [Hydrocod Polst-Cpm Polst Er] Other (See Comments)    Other reaction(s): Unknown    Family History  Problem Relation Age of Onset  . Hypertension Father 11       Deceased  . Heart disease Father   . Lung cancer Mother 58       Deceased  . Healthy Sister        x3  .  Healthy Brother        x4  . Heart disease Brother        #5  . Other Daughter        Alpha Thalassemia  . Lupus Daughter        #2-deceased  . Diabetes Neg Hx   . Heart attack Neg Hx   . Hyperlipidemia Neg Hx   . Sudden death Neg Hx     Social History   Socioeconomic History  . Marital status: Married    Spouse name: Not on file  . Number of children: 1  . Years of education: 68  . Highest education level: Not on file  Occupational History  . Occupation: Retired  Tobacco Use  . Smoking status: Never Smoker  . Smokeless tobacco: Never Used  Vaping Use  . Vaping Use: Never used  Substance and Sexual Activity  . Alcohol use: No  .  Drug use: No  . Sexual activity: Not on file  Other Topics Concern  . Not on file  Social History Narrative   Fun: Play golf and build furniture.    Social Determinants of Health   Financial Resource Strain:   . Difficulty of Paying Living Expenses: Not on file  Food Insecurity: No Food Insecurity  . Worried About Charity fundraiser in the Last Year: Never true  . Ran Out of Food in the Last Year: Never true  Transportation Needs: No Transportation Needs  . Lack of Transportation (Medical): No  . Lack of Transportation (Non-Medical): No  Physical Activity:   . Days of Exercise per Week: Not on file  . Minutes of Exercise per Session: Not on file  Stress:   . Feeling of Stress : Not on file  Social Connections:   . Frequency of Communication with Friends and Family: Not on file  . Frequency of Social Gatherings with Friends and Family: Not on file  . Attends Religious Services: Not on file  . Active Member of Clubs or Organizations: Not on file  . Attends Archivist Meetings: Not on file  . Marital Status: Not on file   Review of Systems - See HPI.  All other ROS are negative.  BP 128/80   Pulse (!) 59   Temp 97.7 F (36.5 C) (Temporal)   Resp 16   Ht 5\' 8"  (1.727 m)   Wt 214 lb (97.1 kg)   SpO2 99%   BMI 32.54 kg/m   Physical Exam Vitals reviewed.  Constitutional:      Appearance: Normal appearance.  HENT:     Head: Normocephalic and atraumatic.  Cardiovascular:     Rate and Rhythm: Normal rate and regular rhythm.     Pulses: Normal pulses.          Dorsalis pedis pulses are 2+ on the right side and 2+ on the left side.       Posterior tibial pulses are 2+ on the right side and 2+ on the left side.     Heart sounds: Normal heart sounds.     Comments: No calf swelling bilaterally. Negative Homan sign bilaterally.  Pulmonary:     Effort: Pulmonary effort is normal.     Breath sounds: Normal breath sounds.  Musculoskeletal:     Cervical back: Neck  supple.     Right lower leg: 1+ Edema present.     Left lower leg: 1+ Edema present.  Neurological:     General: No focal deficit present.  Mental Status: He is alert and oriented to person, place, and time.  Psychiatric:        Mood and Affect: Mood normal.    Recent Results (from the past 2160 hour(s))  CMP ([place] only)     Status: Abnormal   Collection Time: 05/15/20  9:54 AM  Result Value Ref Range   Sodium 141 135 - 145 mmol/L   Potassium 3.2 (L) 3.5 - 5.1 mmol/L   Chloride 103 98 - 111 mmol/L   CO2 29 22 - 32 mmol/L   Glucose, Bld 87 70 - 99 mg/dL    Comment: Glucose reference range applies only to samples taken after fasting for at least 8 hours.   BUN 10 8 - 23 mg/dL   Creatinine 1.27 (H) 0.61 - 1.24 mg/dL   Calcium 9.2 8.9 - 10.3 mg/dL   Total Protein 7.1 6.5 - 8.1 g/dL   Albumin 3.6 3.5 - 5.0 g/dL   AST 22 15 - 41 U/L   ALT 18 0 - 44 U/L   Alkaline Phosphatase 76 38 - 126 U/L   Total Bilirubin 1.5 (H) 0.3 - 1.2 mg/dL   GFR, Est Non Af Am 55 (L) >60 mL/min   GFR, Est AFR Am >60 >60 mL/min   Anion gap 9 5 - 15    Comment: Performed at [facility], [place] [address], [place], [place] [address]  CBC with Differential ([place] Only)     Status: Abnormal   Collection Time: 05/15/20  9:54 AM  Result Value Ref Range   WBC Count 3.5 (L) 4.0 - 10.5 K/uL   RBC 5.66 4.22 - 5.81 MIL/uL   Hemoglobin 16.0 13.0 - 17.0 g/dL   HCT 49.6 39 - 52 %   MCV 87.6 80.0 - 100.0 fL   MCH 28.3 26.0 - 34.0 pg   MCHC 32.3 30.0 - 36.0 g/dL   RDW 13.1 11.5 - 15.5 %   Platelet Count 163 150 - 400 K/uL   nRBC 0.0 0.0 - 0.2 %   Neutrophils Relative % 39 %   Neutro Abs 1.4 (L) 1.7 - 7.7 K/uL   Lymphocytes Relative 34 %   Lymphs Abs 1.2 0.7 - 4.0 K/uL   Monocytes Relative 24 %   Monocytes Absolute 0.8 0 - 1 K/uL   Eosinophils Relative 2 %   Eosinophils Absolute 0.1 0 - 0 K/uL   Basophils Relative 1 %   Basophils Absolute 0.0 0 - 0 K/uL   Immature  Granulocytes 0 %   Abs Immature Granulocytes 0.01 0.00 - 0.07 K/uL    Comment: Performed at [facility], [place] [address] [place], [place] [address]   Assessment/Plan: 1. Peripheral edema No signs of cellulitis. On anticoagulant without calf swelling so very low suspicion for clot. Will continue chlorthalidone and potassium supplement. Dietary and hydration recommendations reviewed. Will add on low dose Furosemide daily for the next few days ( intolerant of potassium sparing diuretics) then PRN. Elevate legs while resting. Follow-up 7-10 days.   This visit occurred during the SARS-CoV-2 public health emergency.  Safety protocols were in place, including screening questions prior to the visit, additional usage of staff PPE, and extensive cleaning of exam room while observing appropriate contact time as indicated for disinfecting solutions.      Leeanne Rio, PA-C

## 2020-07-21 ENCOUNTER — Encounter: Payer: Self-pay | Admitting: Physician Assistant

## 2020-07-21 ENCOUNTER — Other Ambulatory Visit: Payer: Self-pay

## 2020-07-21 ENCOUNTER — Ambulatory Visit (INDEPENDENT_AMBULATORY_CARE_PROVIDER_SITE_OTHER): Payer: Medicare HMO | Admitting: Physician Assistant

## 2020-07-21 VITALS — BP 130/80 | HR 58 | Temp 97.2°F | Resp 16 | Ht 68.0 in | Wt 210.0 lb

## 2020-07-21 DIAGNOSIS — R609 Edema, unspecified: Secondary | ICD-10-CM

## 2020-07-21 LAB — BASIC METABOLIC PANEL
BUN: 18 mg/dL (ref 6–23)
CO2: 31 mEq/L (ref 19–32)
Calcium: 9.5 mg/dL (ref 8.4–10.5)
Chloride: 98 mEq/L (ref 96–112)
Creatinine, Ser: 1.49 mg/dL (ref 0.40–1.50)
GFR: 55.56 mL/min — ABNORMAL LOW (ref >60.00)
Glucose, Bld: 90 mg/dL (ref 70–99)
Potassium: 3.2 mEq/L — ABNORMAL LOW (ref 3.5–5.1)
Sodium: 139 mEq/L (ref 135–145)

## 2020-07-21 NOTE — Patient Instructions (Addendum)
Your leg looks much better today! Continue low-salt diet and elevate legs while resting. We can use the furosemide on an as-needed basis instead of taking daily now.  Make sure for ongoing shoulder health that you avoid any heavy lifting or overhead lifting with that arm. Apply heating pad to the area for 10 to 15 minutes a couple times per day. Can apply topical OTC voltaren gel to the area. Let us know if symptoms are not calming back down.  Please go to the lab today for blood work.  I will call you with your results. We will alter treatment regimen(s) if indicated by your results.

## 2020-07-21 NOTE — Progress Notes (Signed)
Patient presents to clinic today for follow-up of peripheral edema after addition of furosemide 20 mg.  Patient endorses taking medication as directed along with his chronic medication regimen including potassium.  Is keeping a low-salt diet and has been elevating legs while resting.  Has noted substantial improvement in swelling.  Denies any excess urination with addition of furosemide.  All in all states he feels much better.  Past Medical History:  Diagnosis Date  . Atrial flutter ([place])   . Colon polyps   . Constipation 05/27/2015  . DOE (dyspnea on exertion) 10/13/2015  . Faintness 06/20/2016  . History of chicken pox   . Hyperlipemia 02/13/2016  . Hyperlipidemia   . Hypertension   . Hypokalemia 06/28/2016  . Hyponatremia 06/28/2016  . Internal hemorrhoids   . Irregular heartbeat   . Malignant hypertension 07/15/2015  . Sciatica   . Stroke [facility]) 2008  . Syncope and collapse 06/28/2016    Current Outpatient Medications on File Prior to Visit  Medication Sig Dispense Refill  . amLODipine (NORVASC) 10 MG tablet Take 10 mg by mouth daily.    . chlorthalidone (HYGROTON) 25 MG tablet Take 1 tablet by mouth once daily 90 tablet 0  . ELIQUIS 5 MG TABS tablet Take 1 tablet by mouth twice daily 60 tablet 5  . furosemide (LASIX) 20 MG tablet Take 1 tablet (20 mg total) by mouth daily. 15 tablet 0  . gabapentin (NEURONTIN) 100 MG capsule Take 1 capsule (100 mg total) by mouth 4 (four) times daily. (Patient taking differently: Take 100 mg by mouth 3 (three) times daily. ) 360 capsule 1  . oxyCODONE-acetaminophen (PERCOCET/ROXICET) 5-325 MG tablet Take 1 tablet by mouth every 4 (four) hours as needed for severe pain. 12 tablet 0  . potassium chloride SA (KLOR-CON M20) 20 MEQ tablet TAKE 1 TABLET BY MOUTH IN THE MORNING AND 2 IN THE EVENING (Patient taking differently: TAKE 1.5 TABLET BY MOUTH IN THE MORNING AND 1 IN THE EVENING) 90 tablet 0  . psyllium (REGULOID) 0.52 g capsule Take by mouth.     No  current facility-administered medications on file prior to visit.    Allergies  Allergen Reactions  . Crestor [Rosuvastatin Calcium] Shortness Of Breath    Muscle cramps  . Spironolactone Shortness Of Breath  . Terazosin Shortness Of Breath, Palpitations and Other (See Comments)    Nerve pain  . Atenolol Other (See Comments)    Drowsiness and "flu sxs"  . Bystolic [Nebivolol Hcl] Other (See Comments)    GI issues  . Hydralazine Other (See Comments)    Joint swelling  . Tizanidine Other (See Comments)    Syncope, Elevated BP/pulse  . Amlodipine Other (See Comments)  . Clonidine Other (See Comments)  . Hydrocodone-Acetaminophen Other (See Comments)  . Levofloxacin Other (See Comments)  . Losartan Other (See Comments)    Did not work.  Pt states he couldn't take b/c of SE, but can't remember what SE were.  . Maxzide [Hydrochlorothiazide W-Triamterene] Other (See Comments)    Does not tolerate potassium-sparing diuretics  . Olmesartan Other (See Comments)  . Tape Other (See Comments)    Other reaction(s): Unknown  . Tussionex Pennkinetic Er [Hydrocod Polst-Cpm Polst Er] Other (See Comments)    Other reaction(s): Unknown    Family History  Problem Relation Age of Onset  . Hypertension Father 29       Deceased  . Heart disease Father   . Lung cancer Mother 31  Deceased  . Healthy Sister        x3  . Healthy Brother        x4  . Heart disease Brother        #5  . Other Daughter        Alpha Thalassemia  . Lupus Daughter        #2-deceased  . Diabetes Neg Hx   . Heart attack Neg Hx   . Hyperlipidemia Neg Hx   . Sudden death Neg Hx     Social History   Socioeconomic History  . Marital status: Married    Spouse name: Not on file  . Number of children: 1  . Years of education: 26  . Highest education level: Not on file  Occupational History  . Occupation: Retired  Tobacco Use  . Smoking status: Never Smoker  . Smokeless tobacco: Never Used  Vaping Use    . Vaping Use: Never used  Substance and Sexual Activity  . Alcohol use: No  . Drug use: No  . Sexual activity: Not on file  Other Topics Concern  . Not on file  Social History Narrative   Fun: Play golf and build furniture.    Social Determinants of Health   Financial Resource Strain:   . Difficulty of Paying Living Expenses: Not on file  Food Insecurity: No Food Insecurity  . Worried About Charity fundraiser in the Last Year: Never true  . Ran Out of Food in the Last Year: Never true  Transportation Needs: No Transportation Needs  . Lack of Transportation (Medical): No  . Lack of Transportation (Non-Medical): No  Physical Activity:   . Days of Exercise per Week: Not on file  . Minutes of Exercise per Session: Not on file  Stress:   . Feeling of Stress : Not on file  Social Connections:   . Frequency of Communication with Friends and Family: Not on file  . Frequency of Social Gatherings with Friends and Family: Not on file  . Attends Religious Services: Not on file  . Active Member of Clubs or Organizations: Not on file  . Attends Archivist Meetings: Not on file  . Marital Status: Not on file   Review of Systems - See HPI.  All other ROS are negative.  Resp 16   Ht 5\' 8"  (1.727 m)   Wt 210 lb (95.3 kg)   BMI 31.93 kg/m   Physical Exam Vitals reviewed.  Constitutional:      Appearance: Normal appearance.  HENT:     Head: Normocephalic and atraumatic.  Cardiovascular:     Rate and Rhythm: Normal rate and regular rhythm.     Pulses: Normal pulses.     Heart sounds: Normal heart sounds.  Pulmonary:     Effort: Pulmonary effort is normal.  Musculoskeletal:     Cervical back: Neck supple.     Right lower leg: No edema.     Left lower leg: No edema.  Neurological:     General: No focal deficit present.     Mental Status: He is alert.  Psychiatric:        Mood and Affect: Mood normal.     Recent Results (from the past 2160 hour(s))  CMP ([place] only)     Status: Abnormal   Collection Time: 05/15/20  9:54 AM  Result Value Ref Range   Sodium 141 135 - 145 mmol/L   Potassium 3.2 (L) 3.5 - 5.1  mmol/L   Chloride 103 98 - 111 mmol/L   CO2 29 22 - 32 mmol/L   Glucose, Bld 87 70 - 99 mg/dL    Comment: Glucose reference range applies only to samples taken after fasting for at least 8 hours.   BUN 10 8 - 23 mg/dL   Creatinine 1.27 (H) 0.61 - 1.24 mg/dL   Calcium 9.2 8.9 - 10.3 mg/dL   Total Protein 7.1 6.5 - 8.1 g/dL   Albumin 3.6 3.5 - 5.0 g/dL   AST 22 15 - 41 U/L   ALT 18 0 - 44 U/L   Alkaline Phosphatase 76 38 - 126 U/L   Total Bilirubin 1.5 (H) 0.3 - 1.2 mg/dL   GFR, Est Non Af Am 55 (L) >60 mL/min   GFR, Est AFR Am >60 >60 mL/min   Anion gap 9 5 - 15    Comment: Performed at [facility], [place] [address] [place], [place] [address]  CBC with Differential ([place] Only)     Status: Abnormal   Collection Time: 05/15/20  9:54 AM  Result Value Ref Range   WBC Count 3.5 (L) 4.0 - 10.5 K/uL   RBC 5.66 4.22 - 5.81 MIL/uL   Hemoglobin 16.0 13.0 - 17.0 g/dL   HCT 49.6 39 - 52 %   MCV 87.6 80.0 - 100.0 fL   MCH 28.3 26.0 - 34.0 pg   MCHC 32.3 30.0 - 36.0 g/dL   RDW 13.1 11.5 - 15.5 %   Platelet Count 163 150 - 400 K/uL   nRBC 0.0 0.0 - 0.2 %   Neutrophils Relative % 39 %   Neutro Abs 1.4 (L) 1.7 - 7.7 K/uL   Lymphocytes Relative 34 %   Lymphs Abs 1.2 0.7 - 4.0 K/uL   Monocytes Relative 24 %   Monocytes Absolute 0.8 0 - 1 K/uL   Eosinophils Relative 2 %   Eosinophils Absolute 0.1 0 - 0 K/uL   Basophils Relative 1 %   Basophils Absolute 0.0 0 - 0 K/uL   Immature Granulocytes 0 %   Abs Immature Granulocytes 0.01 0.00 - 0.07 K/uL    Comment: Performed at [facility], [place] [address] [place], [place] [address]    Assessment/Plan: 1. Peripheral edema Much improved.  No sign of residual swelling on examination today.  Is taking potassium supplement.  Will recheck  BMP today to assess potassium levels.  Would like to switch him to a potassium sparing diuretic giving need for diuresis but he is allergic.  As such we will continue chlorthalidone daily for blood pressure.  Furosemide just on as-needed basis.  Continue potassium supplement.  Will alter dose if indicated by lab results. - Basic metabolic panel  This visit occurred during the SARS-CoV-2 public health emergency.  Safety protocols were in place, including screening questions prior to the visit, additional usage of staff PPE, and extensive cleaning of exam room while observing appropriate contact time as indicated for disinfecting solutions.    Leeanne Rio, PA-C

## 2020-07-31 ENCOUNTER — Telehealth: Payer: Self-pay | Admitting: Physician Assistant

## 2020-07-31 DIAGNOSIS — I10 Essential (primary) hypertension: Secondary | ICD-10-CM

## 2020-07-31 NOTE — Telephone Encounter (Signed)
Patient would like to speak to you about his blood pressure.  States it was 120/65 -

## 2020-07-31 NOTE — Telephone Encounter (Signed)
BP looks good. No concerning findings. HR likely increasing in response to pain. Continue current regimen. Follow-up with specialists as scheduled.  Needs repeat BMP in another 10 days to recheck potassium levels.

## 2020-07-31 NOTE — Telephone Encounter (Signed)
117/70 on Left arm 120/88 on Right arm Pulse increased to 50-104  Just sitting and resting after 9 pm. Denies any activity Denies any sob or feeling winded Having increased pain from arthritis in back and shoulders-going to specialist for that.

## 2020-08-01 ENCOUNTER — Telehealth: Payer: Self-pay | Admitting: Cardiology

## 2020-08-01 NOTE — Telephone Encounter (Signed)
Left detailed message on VM that his blood pressure was normal. Heart rate was elevated due to pain. Continue current medication. Keep follow up with specialist. PCP would like to check potassium level in 10 days. Lab orders placed.

## 2020-08-01 NOTE — Telephone Encounter (Signed)
Patient states that his HR normally stays around the 50's but lately it has been getting up to 102. He wants to know if this is okay and if he should be concerned. He states he feels fine, no headaches, dizziness, blurred vision. BP in Left Arm is 120/75 and Right Arm is 125/88. Please advise.

## 2020-08-01 NOTE — Telephone Encounter (Signed)
Reports HRs jumping around over the last week, going from 50s to low 100s.  States normal HRs avg 50-60s. He did go to PCP for pedal edema, Lasix given for 1 week.  Issue improved. Denies CP, weight gain, SOB, dizziness/syncope. Pt cannot feel when he is in AFib vs NSR.  He does not really feel elevated HRs. Pt scheduled to see NP next week for further discussion/evaluation & EKG Patient verbalized understanding and agreeable to plan.

## 2020-08-05 NOTE — Progress Notes (Deleted)
Electrophysiology Office Note Date: 08/05/2020  ID:  Patrick Roth, DOB 05/15/1946, MRN [id number]  PCP: Delorse Limber Primary Cardiologist: [name] Electrophysiologist: [name]  CC: AF follow up  Patrick Roth is a 74 y.o. male seen today for Dr [name].  He presents today for routine electrophysiology followup.  Since last being seen in our clinic, the patient reports doing very well.  He denies chest pain, palpitations, dyspnea, PND, orthopnea, nausea, vomiting, dizziness, syncope, edema, weight gain, or early satiety.  Past Medical History:  Diagnosis Date   Atrial flutter ([place])    Colon polyps    Constipation 05/27/2015   DOE (dyspnea on exertion) 10/13/2015   Faintness 06/20/2016   History of chicken pox    Hyperlipemia 02/13/2016   Hyperlipidemia    Hypertension    Hypokalemia 06/28/2016   Hyponatremia 06/28/2016   Internal hemorrhoids    Irregular heartbeat    Malignant hypertension 07/15/2015   Sciatica    Stroke [facility]) 2008   Syncope and collapse 06/28/2016   Past Surgical History:  Procedure Laterality Date   CARDIOVERSION N/A 09/02/2017   Procedure: CARDIOVERSION;  Surgeon: [name], MD;  Location: [facility] ENDOSCOPY;  Service: Cardiovascular;  Laterality: N/A;   COLONOSCOPY  2011   great toe surgery     PROSTATE BIOPSY      Current Outpatient Medications  Medication Sig Dispense Refill   amLODipine (NORVASC) 10 MG tablet Take 10 mg by mouth daily.     chlorthalidone (HYGROTON) 25 MG tablet Take 1 tablet by mouth once daily 90 tablet 0   ELIQUIS 5 MG TABS tablet Take 1 tablet by mouth twice daily 60 tablet 5   furosemide (LASIX) 20 MG tablet Take 1 tablet (20 mg total) by mouth daily. 15 tablet 0   gabapentin (NEURONTIN) 100 MG capsule Take 1 capsule (100 mg total) by mouth 4 (four) times daily. (Patient taking differently: Take 100 mg by mouth 3 (three) times daily. ) 360 capsule 1   oxyCODONE-acetaminophen  (PERCOCET/ROXICET) 5-325 MG tablet Take 1 tablet by mouth every 4 (four) hours as needed for severe pain. 12 tablet 0   potassium chloride SA (KLOR-CON M20) 20 MEQ tablet TAKE 1 TABLET BY MOUTH IN THE MORNING AND 2 IN THE EVENING (Patient taking differently: TAKE 1.5 TABLET BY MOUTH IN THE MORNING AND 1 IN THE EVENING) 90 tablet 0   psyllium (REGULOID) 0.52 g capsule Take by mouth.     No current facility-administered medications for this visit.    Allergies:   Crestor [rosuvastatin calcium], Spironolactone, Terazosin, Atenolol, Bystolic [nebivolol hcl], Hydralazine, Tizanidine, Amlodipine, Clonidine, Hydrocodone-acetaminophen, Levofloxacin, Losartan, Maxzide [hydrochlorothiazide w-triamterene], Olmesartan, Tape, and Tussionex pennkinetic er [employer] polst-cpm polst er]   Social History: Social History   Socioeconomic History   Marital status: Married    Spouse name: Not on file   Number of children: 1   Years of education: 12   Highest education level: Not on file  Occupational History   Occupation: Retired  Tobacco Use   Smoking status: Never Smoker   Smokeless tobacco: Never Used  Scientific laboratory technician Use: Never used  Substance and Sexual Activity   Alcohol use: No   Drug use: No   Sexual activity: Not on file  Other Topics Concern   Not on file  Social History Narrative   Fun: Play golf and build furniture.    Social Determinants of Health   Financial Resource Strain:  Difficulty of Paying Living Expenses: Not on file  Food Insecurity: No Food Insecurity   Worried About [place] in the Last Year: Never true   Ran Out of Food in the Last Year: Never true  Transportation Needs: No Transportation Needs   Lack of Transportation (Medical): No   Lack of Transportation (Non-Medical): No  Physical Activity:    Days of Exercise per Week: Not on file   Minutes of Exercise per Session: Not on file  Stress:    Feeling of Stress : Not on file    Social Connections:    Frequency of Communication with Friends and Family: Not on file   Frequency of Social Gatherings with Friends and Family: Not on file   Attends Religious Services: Not on file   Active Member of Clubs or Organizations: Not on file   Attends Archivist Meetings: Not on file   Marital Status: Not on file  Intimate Partner Violence:    Fear of Current or Ex-Partner: Not on file   Emotionally Abused: Not on file   Physically Abused: Not on file   Sexually Abused: Not on file    Family History: Family History  Problem Relation Age of Onset   Hypertension Father 35       Deceased   Heart disease Father    Lung cancer Mother 75       Deceased   Healthy Sister        x3   Healthy Brother        x4   Heart disease Brother        #5   Other Daughter        Alpha Thalassemia   Lupus Daughter        #2-deceased   Diabetes Neg Hx    Heart attack Neg Hx    Hyperlipidemia Neg Hx    Sudden death Neg Hx     Review of Systems: All other systems reviewed and are otherwise negative except as noted above.   Physical Exam: VS:  There were no vitals taken for this visit. , BMI There is no height or weight on file to calculate BMI. Wt Readings from Last 3 Encounters:  07/21/20 210 lb (95.3 kg)  07/14/20 214 lb (97.1 kg)  05/15/20 209 lb 14.4 oz (95.2 kg)    GEN- The patient is well appearing, alert and oriented x 3 today.   HEENT: normocephalic, atraumatic; sclera clear, conjunctiva pink; hearing intact; oropharynx clear; neck supple, no JVP Lymph- no cervical lymphadenopathy Lungs- Clear to ausculation bilaterally, normal work of breathing.  No wheezes, rales, rhonchi Heart- Regular rate and rhythm, no murmurs, rubs or gallops, PMI not laterally displaced GI- soft, non-tender, non-distended, bowel sounds present, no hepatosplenomegaly Extremities- no clubbing, cyanosis, or edema; DP/PT/radial pulses 2+ bilaterally MS- no  significant deformity or atrophy Skin- warm and dry, no rash or lesion  Psych- euthymic mood, full affect Neuro- strength and sensation are intact   EKG:  EKG is ordered today. The ekg ordered today shows ***  Recent Labs: 11/14/2019: TSH 0.936 05/15/2020: ALT 18; Hemoglobin 16.0; Platelet Count 163 07/21/2020: BUN 18; Creatinine, Ser 1.49; Potassium 3.2; Sodium 139    Other studies Reviewed: Additional studies/ records that were reviewed today include: Dr [name]' office notes  Assessment and Plan:  1.  Persistent atrial fibrillation *** Appropriately anticoagulated with Eliquis for CHADS2VASC of 4 Increase amiodarone to 200mg  bid x4 weeks then 200mg  daily TSH, LFTs, BMET ***  DCCV  2.  HTN Stable No change required today    Current medicines are reviewed at length with the patient today.   The patient does not have concerns regarding his medicines.  The following changes were made today:  ***  Labs/ tests ordered today include: *** No orders of the defined types were placed in this encounter.    Disposition:   Follow up with AF clinic 4 weeks    Signed, Chanetta Marshall, NP 08/05/2020 6:43 PM   [address] [place] [address] [phone] (office) [phone] (fax)

## 2020-08-06 ENCOUNTER — Ambulatory Visit: Payer: Medicare HMO | Admitting: Nurse Practitioner

## 2020-08-08 ENCOUNTER — Other Ambulatory Visit: Payer: Self-pay | Admitting: Physician Assistant

## 2020-08-11 ENCOUNTER — Other Ambulatory Visit: Payer: Self-pay | Admitting: Cardiology

## 2020-08-11 NOTE — Telephone Encounter (Signed)
Prescription refill request for Eliquis received.  Last office visit:11/27/2019, Camn[place] Scr: 1.49, 07/21/2020 Age: 74 y.o. Weight: 95.3 kg   Prescription refill sent.

## 2020-08-12 ENCOUNTER — Telehealth: Payer: Self-pay | Admitting: Cardiology

## 2020-08-12 NOTE — Telephone Encounter (Signed)
Patient wants to know if he still needed an Echocardiogram scheduled.

## 2020-08-12 NOTE — Telephone Encounter (Signed)
Spoke with pt and advised Dr [name] and his RN are not in the office today but will forward question for review and recommendation.  Pt verbalizes understanding and agrees with current plan.

## 2020-08-13 NOTE — Telephone Encounter (Signed)
Pt wanted to know if he needed follow up, not echocardiogram.  He states that he spoke with nurse recently and thought he was going to be seeing someone after that call.  Pt advised that he was scheduled to see NP on 9/15 but "no showed" to that appt.  Per pt, he forgot. Aware office will call to reschedule needed appt/EKG. Pt agreeable to plan.

## 2020-08-18 ENCOUNTER — Telehealth: Payer: Self-pay | Admitting: Cardiology

## 2020-08-18 NOTE — Telephone Encounter (Signed)
Patrick Roth is calling stating he is calling to confirm his appointment for this Wednesday he was advised was rescheduled for an ultrasound, but I was unable to find documentation to support this. I advised him he was called to reschedule with Amber, but he did not want to reschedule until he was aware of what exactly he is needing. Please advise.

## 2020-08-18 NOTE — Telephone Encounter (Signed)
Attempted to reach pt to discuss. No answer, no voicemail

## 2020-08-19 NOTE — Telephone Encounter (Signed)
Left message, with wife, to call back

## 2020-08-19 NOTE — Telephone Encounter (Signed)
Reminded that this is follow up appt that we discussed earlier this month when he reported bradycardia.  Aware that this is to reschedule the 9/15 visit that he "no showed" to (pt forgot). Pt agreeable to rescheduling appt.

## 2020-08-21 ENCOUNTER — Telehealth: Payer: Self-pay | Admitting: Student

## 2020-08-21 ENCOUNTER — Ambulatory Visit (INDEPENDENT_AMBULATORY_CARE_PROVIDER_SITE_OTHER): Payer: Medicare HMO | Admitting: Student

## 2020-08-21 ENCOUNTER — Encounter: Payer: Self-pay | Admitting: Student

## 2020-08-21 ENCOUNTER — Other Ambulatory Visit: Payer: Self-pay

## 2020-08-21 VITALS — BP 128/86 | HR 110 | Ht 68.0 in | Wt 215.8 lb

## 2020-08-21 DIAGNOSIS — Z79899 Other long term (current) drug therapy: Secondary | ICD-10-CM

## 2020-08-21 DIAGNOSIS — I4819 Other persistent atrial fibrillation: Secondary | ICD-10-CM

## 2020-08-21 DIAGNOSIS — E876 Hypokalemia: Secondary | ICD-10-CM

## 2020-08-21 DIAGNOSIS — I10 Essential (primary) hypertension: Secondary | ICD-10-CM | POA: Diagnosis not present

## 2020-08-21 LAB — CBC WITH DIFFERENTIAL/PLATELET
Basophils Absolute: 0 10*3/uL (ref 0.0–0.2)
Basos: 0 %
EOS (ABSOLUTE): 0.1 10*3/uL (ref 0.0–0.4)
Eos: 1 %
Hematocrit: 49.9 % (ref 37.5–51.0)
Hemoglobin: 16.2 g/dL (ref 13.0–17.7)
Immature Grans (Abs): 0 10*3/uL (ref 0.0–0.1)
Immature Granulocytes: 0 %
Lymphocytes Absolute: 1.3 10*3/uL (ref 0.7–3.1)
Lymphs: 29 %
MCH: 28.1 pg (ref 26.6–33.0)
MCHC: 32.5 g/dL (ref 31.5–35.7)
MCV: 87 fL (ref 79–97)
Monocytes Absolute: 0.6 10*3/uL (ref 0.1–0.9)
Monocytes: 12 %
Neutrophils Absolute: 2.7 10*3/uL (ref 1.4–7.0)
Neutrophils: 58 %
Platelets: 161 10*3/uL (ref 150–450)
RBC: 5.76 x10E6/uL (ref 4.14–5.80)
RDW: 14.8 % (ref 11.6–15.4)
WBC: 4.7 10*3/uL (ref 3.4–10.8)

## 2020-08-21 LAB — COMPREHENSIVE METABOLIC PANEL
ALT: 18 IU/L (ref 0–44)
AST: 23 IU/L (ref 0–40)
Albumin/Globulin Ratio: 1.5 (ref 1.2–2.2)
Albumin: 4.4 g/dL (ref 3.7–4.7)
Alkaline Phosphatase: 83 IU/L (ref 44–121)
BUN/Creatinine Ratio: 11 (ref 10–24)
BUN: 15 mg/dL (ref 8–27)
Bilirubin Total: 1.2 mg/dL (ref 0.0–1.2)
CO2: 27 mmol/L (ref 20–29)
Calcium: 9.4 mg/dL (ref 8.6–10.2)
Chloride: 101 mmol/L (ref 96–106)
Creatinine, Ser: 1.31 mg/dL — ABNORMAL HIGH (ref 0.76–1.27)
GFR calc Af Amer: 61 mL/min/{1.73_m2} (ref >59)
GFR calc non Af Amer: 53 mL/min/{1.73_m2} — ABNORMAL LOW (ref >59)
Globulin, Total: 2.9 g/dL (ref 1.5–4.5)
Glucose: 101 mg/dL — ABNORMAL HIGH (ref 65–99)
Potassium: 3.5 mmol/L (ref 3.5–5.2)
Sodium: 141 mmol/L (ref 134–144)
Total Protein: 7.3 g/dL (ref 6.0–8.5)

## 2020-08-21 LAB — TSH: TSH: 1.64 u[IU]/mL (ref 0.450–4.500)

## 2020-08-21 MED ORDER — AMIODARONE HCL 200 MG PO TABS: 200.0000 mg | ORAL_TABLET | Freq: Every day | ORAL | 1 refills | Status: DC

## 2020-08-21 NOTE — Telephone Encounter (Signed)
Patient states he was just prescribe amiodarone (PACERONE) 200 MG tablet, she states he take the amlodipine 10mg .

## 2020-08-21 NOTE — Patient Instructions (Signed)
Medication Instructions:  Your physician has recommended you make the following change in your medication:  -- INCREASE AMIODARONE to 200 mg - Take 1 tablet (200 mg) by mouth daily -- NEW RX SENT -- *If you need a refill on your cardiac medications before your next appointment, please call your pharmacy*  Lab Work: Your physician has recommended that you have lab work today: CMET, TSH, and CBC If you have labs (blood work) drawn today and your tests are completely normal, you will receive your results only by: Marland Kitchen MyChart Message (if you have MyChart) OR . A paper copy in the mail If you have any lab test that is abnormal or we need to change your treatment, we will call you to review the results.  Follow-Up: At [facility], you and your health needs are our priority.  As part of our continuing mission to provide you with exceptional heart care, we have created designated Provider Care Teams.  These Care Teams include your primary Cardiologist (physician) and Advanced Practice Providers (APPs -  Physician Assistants and Nurse Practitioners) who all work together to provide you with the care you need, when you need it.  We recommend signing up for the patient portal called "MyChart".  Sign up information is provided on this After Visit Summary.  MyChart is used to connect with patients for Virtual Visits (Telemedicine).  Patients are able to view lab/test results, encounter notes, upcoming appointments, etc.  Non-urgent messages can be sent to your provider as well.   To learn more about what you can do with MyChart, go to [email or web address].    Your next appointment:   Your physician recommends that you schedule a follow-up appointment in: 3 WEEKS with the Afib Clinic   The format for your next appointment:   In Person with You will follow up in the [facility] located at [facility].  Your provider will be: Roderic Palau, NP

## 2020-08-21 NOTE — Progress Notes (Signed)
PCP:  Brunetta Jeans, PA-C Primary Cardiologist: Mertie Moores, MD Electrophysiologist: [name], MD   Patrick Roth is a 74 y.o. male seen today for [name], MD for acute visit due to irregular heart beats.  Since last being seen in our clinic the patient reports doing OK. He went to the beach several weeks ago and noticed increased peripheral edema on the way there. He started paying closer attention to his vitals and noted his HRs were ranging from 50-110s. His edema resolved without intervention. His K supp has been adjusted over the past months. He denies recent illness, CP, SOB, N/V/D, fever, chills or missing any of his medications. He takes eliquis twice daily and has not missed any.   Past Medical History:  Diagnosis Date  . Atrial flutter ([place])   . Colon polyps   . Constipation 05/27/2015  . DOE (dyspnea on exertion) 10/13/2015  . Faintness 06/20/2016  . History of chicken pox   . Hyperlipemia 02/13/2016  . Hyperlipidemia   . Hypertension   . Hypokalemia 06/28/2016  . Hyponatremia 06/28/2016  . Internal hemorrhoids   . Irregular heartbeat   . Malignant hypertension 07/15/2015  . Sciatica   . Stroke [facility]) 2008  . Syncope and collapse 06/28/2016   Past Surgical History:  Procedure Laterality Date  . CARDIOVERSION N/A 09/02/2017   Procedure: CARDIOVERSION;  Surgeon: [name], MD;  Location[facility] ENDOSCOPY;  Service: Cardiovascular;  Laterality: N/A;  . COLONOSCOPY  2011  . great toe surgery    . PROSTATE BIOPSY      Current Outpatient Medications  Medication Sig Dispense Refill  . chlorthalidone (HYGROTON) 25 MG tablet Take 1 tablet by mouth once daily 90 tablet 0  . ELIQUIS 5 MG TABS tablet Take 1 tablet by mouth twice daily 60 tablet 5  . furosemide (LASIX) 20 MG tablet Take 1 tablet (20 mg total) by mouth daily. 15 tablet 0  . gabapentin (NEURONTIN) 100 MG capsule Take 1 capsule (100 mg total) by mouth 4 (four) times daily. 360 capsule 1  .  oxyCODONE-acetaminophen (PERCOCET/ROXICET) 5-325 MG tablet Take 1 tablet by mouth every 4 (four) hours as needed for severe pain. 12 tablet 0  . potassium chloride SA (KLOR-CON) 20 MEQ tablet TAKE 1 TABLET BY MOUTH IN THE MORNING AND 2 IN THE EVENING 270 tablet 1  . psyllium (REGULOID) 0.52 g capsule Take by mouth.    Marland Kitchen amiodarone (PACERONE) 200 MG tablet Take 1 tablet (200 mg total) by mouth daily. 90 tablet 1   No current facility-administered medications for this visit.    Allergies  Allergen Reactions  . Crestor [Rosuvastatin Calcium] Shortness Of Breath    Muscle cramps  . Spironolactone Shortness Of Breath  . Terazosin Shortness Of Breath, Palpitations and Other (See Comments)    Nerve pain  . Atenolol Other (See Comments)    Drowsiness and "flu sxs"  . Bystolic [Nebivolol Hcl] Other (See Comments)    GI issues  . Hydralazine Other (See Comments)    Joint swelling  . Tizanidine Other (See Comments)    Syncope, Elevated BP/pulse  . Amlodipine Other (See Comments)  . Clonidine Other (See Comments)  . Hydrocodone-Acetaminophen Other (See Comments)  . Levofloxacin Other (See Comments)  . Losartan Other (See Comments)    Did not work.  Pt states he couldn't take b/c of SE, but can't remember what SE were.  Marland Kitchen Maxzide [Hydrochlorothiazide W-Triamterene] Other (See Comments)  Does not tolerate potassium-sparing diuretics  . Olmesartan Other (See Comments)  . Tape Other (See Comments)    Other reaction(s): Unknown  . Tussionex Pennkinetic Er [Hydrocod Polst-Cpm Polst Er] Other (See Comments)    Other reaction(s): Unknown    Social History   Socioeconomic History  . Marital status: Married    Spouse name: Not on file  . Number of children: 1  . Years of education: 55  . Highest education level: Not on file  Occupational History  . Occupation: Retired  Tobacco Use  . Smoking status: Never Smoker  . Smokeless tobacco: Never Used  Vaping Use  . Vaping Use: Never used    Substance and Sexual Activity  . Alcohol use: No  . Drug use: No  . Sexual activity: Not on file  Other Topics Concern  . Not on file  Social History Narrative   Fun: Play golf and build furniture.    Social Determinants of Health   Financial Resource Strain:   . Difficulty of Paying Living Expenses: Not on file  Food Insecurity: No Food Insecurity  . Worried About [place] [employer] in the Last Year: Never true  . Ran Out of Food in the Last Year: Never true  Transportation Needs: No Transportation Needs  . Lack of Transportation (Medical): No  . Lack of Transportation (Non-Medical): No  Physical Activity:   . Days of Exercise per Week: Not on file  . Minutes of Exercise per Session: Not on file  Stress:   . Feeling of Stress : Not on file  Social Connections:   . Frequency of Communication with Friends and Family: Not on file  . Frequency of Social Gatherings with Friends and Family: Not on file  . Attends Religious Services: Not on file  . Active Member of Clubs or Organizations: Not on file  . Attends Archivist Meetings: Not on file  . Marital Status: Not on file  Intimate Partner Violence:   . Fear of Current or Ex-Partner: Not on file  . Emotionally Abused: Not on file  . Physically Abused: Not on file  . Sexually Abused: Not on file    Review of Systems: General: No chills, fever, night sweats or weight changes  Cardiovascular:  No chest pain, dyspnea on exertion, edema, orthopnea, palpitations, paroxysmal nocturnal dyspnea Dermatological: No rash, lesions or masses Respiratory: No cough, dyspnea Urologic: No hematuria, dysuria Abdominal: No nausea, vomiting, diarrhea, bright red blood per rectum, melena, or hematemesis Neurologic: No visual changes, weakness, changes in mental status All other systems reviewed and are otherwise negative except as noted above.  Physical Exam: Vitals:   08/21/20 1030  BP: 128/86  Pulse: (!) 110  SpO2: 97%   Weight: 215 lb 12.8 oz (97.9 kg)  Height: 5\' 8"  (1.727 m)   GEN- The patient is well appearing, alert and oriented x 3 today.   HEENT: normocephalic, atraumatic; sclera clear, conjunctiva pink; hearing intact; oropharynx clear; neck supple, no JVP Lymph- no cervical lymphadenopathy Lungs- Clear to ausculation bilaterally, normal work of breathing.  No wheezes, rales, rhonchi Heart- Regular rate and rhythm, no murmurs, rubs or gallops, PMI not laterally displaced GI- soft, non-tender, non-distended, bowel sounds present, no hepatosplenomegaly Extremities- no clubbing, cyanosis, or edema; DP/PT/radial pulses 2+ bilaterally MS- no significant deformity or atrophy Skin- warm and dry, no rash or lesion Psych- euthymic mood, full affect Neuro- strength and sensation are intact  EKG is ordered. Personal review of EKG from today appears  to show Atrial flutter with RVR at 110 bpm with variable conduction.   Additional studies reviewed include: Previous EP office notes  Assessment and Plan:  1. PAF/atrial flutter EKG today shows atrial flutter with rates in 110s. He would prefer to avoid procedures at this time.  Increase amiodarone 200 mg daily Overall he feels fine. We may be able to pursue a rate control strategy (Note patient has had sinus bradycardia post [facility] in the past, so may not tolerate high dose beta blockade.) Continue Eliquis for CHA2DS2VASC of at least 5.   Amiodarone surveillance labs today.   2. HTN Continue current medications  3. Hypokalemia His past 2 K levels have been 3.2 (2 months apart). K increased 8/30. Recheck today  I would like him to follow up in AF clinic in 3-4 weeks for further. If he remains fast or becomes symptomatic, would consider DCCV. He feels like he has been going in and out rhythm based on his vitals, so increased amiodarone may help him maintain NSR.   Shirley Friar, PA-C  08/21/20 10:53 AM

## 2020-08-21 NOTE — Telephone Encounter (Signed)
Left message for patient to call back  

## 2020-08-22 NOTE — Telephone Encounter (Signed)
Discussed with patient at length during visit and wife and he appear to have been confused about amlodipine vs amiodarone.  They confirmed he was taking amlodipine at check in, but on my review and discussion they were "almost certain" he was taking amiodarone, and NOT amlodipine.   He appears to have stopped amiodarone sometime between his last visit with [place], but I do not see any specific instructions to stop this.   He should resume amiodarone at 200 mg daily as ordered and keep his AF clinic follow up with breakthrough,   He may also resume his amlodipine 10 mg daily for HTN if he was indeed taking it.   Legrand Como [address] [place], PA-C  08/22/2020 10:20 AM

## 2020-08-22 NOTE — Telephone Encounter (Signed)
Called and left message to discuss and clarify medications with paitent

## 2020-09-06 ENCOUNTER — Ambulatory Visit: Payer: Medicare HMO | Attending: Internal Medicine

## 2020-09-06 DIAGNOSIS — Z23 Encounter for immunization: Secondary | ICD-10-CM

## 2020-09-06 NOTE — Progress Notes (Signed)
   Covid-19 Vaccination Clinic  Name:  Patrick Roth    MRN: [id number] DOB: [birth year]  09/06/2020  [name] was observed post Covid-19 immunization for 15 minutes without incident. He was provided with Vaccine Information Sheet and instruction to access the V-Safe system.   [name] was instructed to call 911 with any severe reactions post vaccine: Marland Kitchen Difficulty breathing  . Swelling of face and throat  . A fast heartbeat  . A bad rash all over body  . Dizziness and weakness

## 2020-09-11 ENCOUNTER — Other Ambulatory Visit: Payer: Self-pay

## 2020-09-11 ENCOUNTER — Ambulatory Visit (HOSPITAL_COMMUNITY)
Admission: RE | Admit: 2020-09-11 | Discharge: 2020-09-11 | Disposition: A | Discharge status: Home / Self Care | Payer: Medicare HMO | Source: Ambulatory Visit | Attending: Physician Assistant | Admitting: Physician Assistant

## 2020-09-11 VITALS — BP 134/76 | HR 112 | Ht 68.0 in | Wt 212.8 lb

## 2020-09-11 DIAGNOSIS — Z8249 Family history of ischemic heart disease and other diseases of the circulatory system: Secondary | ICD-10-CM | POA: Diagnosis not present

## 2020-09-11 DIAGNOSIS — I4892 Unspecified atrial flutter: Secondary | ICD-10-CM | POA: Insufficient documentation

## 2020-09-11 DIAGNOSIS — Z885 Allergy status to narcotic agent status: Secondary | ICD-10-CM | POA: Insufficient documentation

## 2020-09-11 DIAGNOSIS — E669 Obesity, unspecified: Secondary | ICD-10-CM | POA: Diagnosis not present

## 2020-09-11 DIAGNOSIS — I484 Atypical atrial flutter: Secondary | ICD-10-CM | POA: Diagnosis not present

## 2020-09-11 DIAGNOSIS — Z7901 Long term (current) use of anticoagulants: Secondary | ICD-10-CM | POA: Diagnosis not present

## 2020-09-11 DIAGNOSIS — Z8673 Personal history of transient ischemic attack (TIA), and cerebral infarction without residual deficits: Secondary | ICD-10-CM | POA: Insufficient documentation

## 2020-09-11 DIAGNOSIS — I4819 Other persistent atrial fibrillation: Secondary | ICD-10-CM | POA: Diagnosis not present

## 2020-09-11 DIAGNOSIS — I10 Essential (primary) hypertension: Secondary | ICD-10-CM | POA: Insufficient documentation

## 2020-09-11 DIAGNOSIS — Z881 Allergy status to other antibiotic agents status: Secondary | ICD-10-CM | POA: Insufficient documentation

## 2020-09-11 DIAGNOSIS — D6869 Other thrombophilia: Secondary | ICD-10-CM | POA: Diagnosis not present

## 2020-09-11 DIAGNOSIS — I480 Paroxysmal atrial fibrillation: Secondary | ICD-10-CM | POA: Insufficient documentation

## 2020-09-11 DIAGNOSIS — Z888 Allergy status to other drugs, medicaments and biological substances status: Secondary | ICD-10-CM | POA: Insufficient documentation

## 2020-09-11 DIAGNOSIS — M5416 Radiculopathy, lumbar region: Secondary | ICD-10-CM

## 2020-09-11 DIAGNOSIS — Z6832 Body mass index (BMI) 32.0-32.9, adult: Secondary | ICD-10-CM | POA: Diagnosis not present

## 2020-09-11 MED ORDER — GABAPENTIN 100 MG PO CAPS: 300.0000 mg | ORAL_CAPSULE | Freq: Every day | ORAL | Status: DC

## 2020-09-11 NOTE — Progress Notes (Signed)
Thank you :)

## 2020-09-11 NOTE — Progress Notes (Signed)
Primary Care Physician: Brunetta Jeans, PA-C Primary Cardiologist: Dr Acie Fredrickson (remote) Primary Electrophysiologist: Dr [name] Referring Physician: Oda Kilts   Patrick Roth is a 74 y.o. male with a history of HTN, paroxysmal atrial fibrillation, prior CVA, HLD who presents for follow up in the [facility]. He was admitted in 7/17 with syncope felt to be 2/2 orthostatic hypotension. He was found to be in new onset AF with RVR.  Patient is on Eliquis for a CHADS2VASC score of 5. He has also been maintained on amiodarone for rhythm control. He saw Oda Kilts on 08/21/20 and was in atrial flutter at that time. His amiodarone was increased to 200 mg daily. Patient prefers to avoid surgical procedures.   On follow up today, patient reports that he actually stopped amiodarone several months ago on his own. He reports the medication made him cold and he had episodes of bradycardia. He does have a history of medication non-adherence. He states he "feels better now than ever." He denies bleeding issues on anticoagulation.   Today, he denies symptoms of palpitations, chest pain, shortness of breath, orthopnea, PND, lower extremity edema, dizziness, presyncope, syncope, snoring, daytime somnolence, bleeding, or neurologic sequela. The patient is tolerating medications without difficulties and is otherwise without complaint today.    Atrial Fibrillation Risk Factors:  he does not have symptoms or diagnosis of sleep apnea. he does not have a history of rheumatic fever.   he has a BMI of Body mass index is 32.36 kg/m.Marland Kitchen Filed Weights   09/11/20 1020  Weight: 96.5 kg    Family History  Problem Relation Age of Onset   Hypertension Father 28       Deceased   Heart disease Father    Lung cancer Mother 52       Deceased   Healthy Sister        x3   Healthy Brother        x4   Heart disease Brother        #5   Other Daughter        Alpha Thalassemia     Lupus Daughter        #2-deceased   Diabetes Neg Hx    Heart attack Neg Hx    Hyperlipidemia Neg Hx    Sudden death Neg Hx      Atrial Fibrillation Management history:  Previous antiarrhythmic drugs: amiodarone Previous cardioversions: 2018 Previous ablations: none CHADS2VASC score: 5 Anticoagulation history: Xarelto, Eliquis   Past Medical History:  Diagnosis Date   Atrial flutter ([place])    Colon polyps    Constipation 05/27/2015   DOE (dyspnea on exertion) 10/13/2015   Faintness 06/20/2016   History of chicken pox    Hyperlipemia 02/13/2016   Hyperlipidemia    Hypertension    Hypokalemia 06/28/2016   Hyponatremia 06/28/2016   Internal hemorrhoids    Irregular heartbeat    Malignant hypertension 07/15/2015   Sciatica    Stroke ([place]) 2008   Syncope and collapse 06/28/2016   Past Surgical History:  Procedure Laterality Date   CARDIOVERSION N/A 09/02/2017   Procedure: CARDIOVERSION;  Surgeon: [name], MD;  Location: [facility] END[facility]COPY;  Service: Cardiovascular;  Laterality: N/A;   COLONOSCOPY  2011   great toe surgery     PROSTATE BIOPSY      Current Outpatient Medications  Medication Sig Dispense Refill   amLODipine (NORVASC) 10 MG tablet Take 10 mg by mouth daily.  chlorthalidone (HYGROTON) 25 MG tablet Take 1 tablet by mouth once daily 90 tablet 0   ELIQUIS 5 MG TABS tablet Take 1 tablet by mouth twice daily 60 tablet 5   gabapentin (NEURONTIN) 100 MG capsule Take 3 capsules (300 mg total) by mouth daily.     oxyCODONE-acetaminophen (PERCOCET/ROXICET) 5-325 MG tablet Take 1 tablet by mouth every 4 (four) hours as needed for severe pain. (Patient taking differently: Take 1 tablet by mouth as needed for severe pain. ) 12 tablet 0   potassium chloride SA (KLOR-CON) 20 MEQ tablet TAKE 1 TABLET BY MOUTH IN THE MORNING AND 2 IN THE EVENING 270 tablet 1   psyllium (REGULOID) 0.52 g capsule Take by mouth.     No current  facility-administered medications for this encounter.    Allergies  Allergen Reactions   Crestor [Rosuvastatin Calcium] Shortness Of Breath    Muscle cramps   Spironolactone Shortness Of Breath   Terazosin Shortness Of Breath, Palpitations and Other (See Comments)    Nerve pain   Amiodarone     Dropped pulse real low to 45-50, and made him feel very cold   Atenolol Other (See Comments)    Drowsiness and "flu sxs"   Bystolic [Nebivolol Hcl] Other (See Comments)    GI issues   Hydralazine Other (See Comments)    Joint swelling   Tizanidine Other (See Comments)    Syncope, Elevated BP/pulse   Amlodipine Other (See Comments)   Clonidine Other (See Comments)   Hydrocodone-Acetaminophen Other (See Comments)   Levofloxacin Other (See Comments)   Losartan Other (See Comments)    Did not work.  Pt states he couldn't take b/c of SE, but can't remember what SE were.   Maxzide [Hydrochlorothiazide W-Triamterene] Other (See Comments)    Does not tolerate potassium-sparing diuretics   Olmesartan Other (See Comments)   Tape Other (See Comments)    Other reaction(s): Unknown   Tussionex Pennkinetic Er [Hydrocod Polst-Cpm Polst Er] Other (See Comments)    Other reaction(s): Unknown    Social History   Socioeconomic History   Marital status: Married    Spouse name: Not on file   Number of children: 1   Years of education: 12   Highest education level: Not on file  Occupational History   Occupation: Retired  Tobacco Use   Smoking status: Never Smoker   Smokeless tobacco: Never Used  Scientific laboratory technician Use: Never used  Substance and Sexual Activity   Alcohol use: No   Drug use: No   Sexual activity: Not on file  Other Topics Concern   Not on file  Social History Narrative   Fun: Play golf and build furniture.    Social Determinants of Health   Financial Resource Strain:    Difficulty of Paying Living Expenses: Not on file  Food Insecurity: No  Food Insecurity   Worried About Charity fundraiser in the Last Year: Never true   Ran Out of Food in the Last Year: Never true  Transportation Needs: No Transportation Needs   Lack of Transportation (Medical): No   Lack of Transportation (Non-Medical): No  Physical Activity:    Days of Exercise per Week: Not on file   Minutes of Exercise per Session: Not on file  Stress:    Feeling of Stress : Not on file  Social Connections:    Frequency of Communication with Friends and Family: Not on file   Frequency of Social Gatherings with Friends  and Family: Not on file   Attends Religious Services: Not on file   Active Member of Clubs or Organizations: Not on file   Attends Archivist Meetings: Not on file   Marital Status: Not on file  Intimate Partner Violence:    Fear of Current or Ex-Partner: Not on file   Emotionally Abused: Not on file   Physically Abused: Not on file   Sexually Abused: Not on file     ROS- All systems are reviewed and negative except as per the HPI above.  Physical Exam: Vitals:   09/11/20 1020  BP: 134/76  Pulse: (!) 112  Weight: 96.5 kg  Height: 5\' 8"  (1.727 m)    GEN- The patient is well appearing obese elderly male, alert and oriented x 3 today.   Head- normocephalic, atraumatic Eyes-  Sclera clear, conjunctiva pink Ears- hearing intact Oropharynx- clear Neck- supple  Lungs- Clear to ausculation bilaterally, normal work of breathing Heart- irregular rate and rhythm, no murmurs, rubs or gallops  GI- soft, NT, ND, + BS Extremities- no clubbing, cyanosis, or edema MS- no significant deformity or atrophy Skin- no rash or lesion Psych- euthymic mood, full affect Neuro- strength and sensation are intact  Wt Readings from Last 3 Encounters:  09/11/20 96.5 kg  08/21/20 97.9 kg  07/21/20 95.3 kg    EKG today demonstrates atypical atrial flutter with variable block, HR 112, RBBB, QRS 140, QTc 526  Echo 02/24/18  demonstrated  - Left ventricle: The cavity size was normal. Systolic function was  normal. The estimated ejection fraction was in the range of 55%  to 60%. Wall motion was normal; there were no regional wall  motion abnormalities. There was a reduced contribution of atrial  contraction to ventricular filling, due to increased ventricular  diastolic pressure or atrial contractile dysfunction. The study  is not technically sufficient to allow evaluation of LV diastolic  function. (suspect left atrial mechanical failure, possible  stunning after recent episode of atrial fibrillation versus  chronic atrial dysfunction)  - Left atrium: The atrium was mildly dilated.  - Right ventricle: The cavity size was mildly dilated. Wall  thickness was normal.  - Right atrium: The atrium was mildly dilated.  - Pulmonary arteries: Systolic pressure was moderately increased.  PA peak pressure: 60 mm Hg (S).  Epic records are reviewed at length today  CHA2DS2-VASc Score = 5  The patient's score is based upon: CHF History: 0 HTN History: 1 Diabetes History: 0 Stroke History: 2 Vascular Disease History: 0 Age Score: 2 Gender Score: 0      ASSESSMENT AND PLAN: 1. Paroxysmal Atrial Fibrillation/atrial flutter The patient's CHA2DS2-VASc score is 5, indicating a 7.2% annual risk of stroke.   We had a long discussion about rate vs rhythm control today. He has a history of medication non-adherence and medication intolerance. He is very hesitant to start anything new. He also declined consideration for ablation. Rate control likely the best option. Will place 3 day Zio patch to assess rate control. Suspect will need to start AV nodal agent.  Continue Eliquis 5 mg BID  2. Secondary Hypercoagulable State (ICD10:  D68.69) The patient is at significant risk for stroke/thromboembolism based upon his CHA2DS2-VASc Score of 5.  Continue Apixaban (Eliquis).   3. Obesity Body mass index is  32.36 kg/m. Lifestyle modification was discussed at length including regular exercise and weight reduction.  4. HTN Stable, no changes today.   Follow up in the AF clinic in  2 weeks.    [facility] [address] [place], [place] [address] [phone] 09/11/2020 11:08 AM

## 2020-09-12 ENCOUNTER — Other Ambulatory Visit: Payer: Self-pay | Admitting: Physician Assistant

## 2020-09-12 DIAGNOSIS — M5416 Radiculopathy, lumbar region: Secondary | ICD-10-CM

## 2020-09-16 ENCOUNTER — Telehealth: Payer: Medicare Other

## 2020-09-18 ENCOUNTER — Ambulatory Visit: Payer: Medicare HMO

## 2020-09-18 DIAGNOSIS — I4819 Other persistent atrial fibrillation: Secondary | ICD-10-CM

## 2020-09-18 DIAGNOSIS — I10 Essential (primary) hypertension: Secondary | ICD-10-CM

## 2020-09-18 NOTE — Patient Instructions (Signed)
[name],  We will prefill the patient assistance application for Eliquis and send this to you in the mail. You will have to drop this off for signing at cardiologist's office once complete. If you have not received this in 1-2 weeks please call me at [phone].  [name], Pharm.D., BCGP Clinical Pharmacist [facility] at [facility] [phone]   Goals Addressed            This Visit's Progress   . BP <140/90   On track    Patrick Roth (see longitudinal plan of care for additional care plan information) Current Barriers:  . Current antihypertensive regimen:   Amlodipine 10 mg daily  Chlorthalidone 25 mg daily   Amiodarone 100 mg daily   Potassium 20 meq daily   BP Readings from Last 3 Encounters:  02/13/20 (!) 157/89  01/24/20 (!) 153/94  01/18/20 (!) 148/78   . Current home BP readings: n/a.  Pharmacist Clinical Goal(s):  Marland Kitchen Over the next 180 days, patient will work with PharmD and providers to optimize antihypertensive regimen.  Interventions: . Inter-disciplinary care team collaboration (see longitudinal plan of care). . Comprehensive medication review performed; medication list updated in the electronic medical record.   Patient Self Care Activities:  . Consider checking BP at home 1-2x/week, document, and provide at future appointments. . Patient will focus on medication adherence by continuing current medication management.  Initial goal documentation.     . Eliquis PAP   On track    [place] (see longitudinal plan of care for additional care plan information) Current Barriers:  . Current Afib anticoagulation regimen:   Pharmacist Clinical Goal(s):  Marland Kitchen Over the next 180 days, patient will work with PharmD and providers to optimize Afib regimen as appropriate. o Patient assistance completion for Eliquis  Interventions: . Inter-disciplinary care team collaboration (see longitudinal plan of care). . Comprehensive  medication review performed; medication list updated in the electronic medical record.   Patient Self Care Activities:  . Complete pt section of Eliquis patient assistance application and return to cardiologist. . Patient will focus on medication adherence by continuing current medication management.  Initial goal documentation.      The patient verbalized understanding of instructions provided today and agreed to receive a mailed copy of patient instruction and/or educational materials. Telephone follow up appointment with pharmacy team member scheduled for: See next appointment with "Care Management Staff" under "What's Next" below.   [name], Pharm.D., BCGP Clinical Pharmacist [facility] [phone]

## 2020-09-18 NOTE — Progress Notes (Signed)
Chronic Care Management Pharmacy  Name: Patrick Roth  MRN: [id number] DOB: 13-Dec-1945  Chief Complaint/ HPI  Patrick Roth,  74 y.o. , male presents for their Initial CCM visit with the clinical pharmacist via telephone due to COVID-19 Pandemic.  PCP : Brunetta Jeans, PA-C  Encounter Diagnoses  Name Primary?  . Essential hypertension Yes  . Persistent atrial fibrillation [facility])      Patient Active Problem List   Diagnosis Date Noted  . Secondary hypercoagulable state ([place]) 09/11/2020  . Cyst of perineum in male 11/28/2018  . Hyperlipidemia   . History of chicken pox   . Colon polyps   . Atypical atrial flutter ([place])   . Other intervertebral disc degeneration, lumbar region 05/09/2017  . Lumbar radiculopathy, chronic 03/29/2017  . Persistent atrial fibrillation ([place]) 09/14/2016  . Essential hypertension 05/27/2015  . Mallet deformity of fourth finger, right 07/11/2013  . Stroke [facility]) 08/23/2012   Past Surgical History:  Procedure Laterality Date  . CARDIOVERSION N/A 09/02/2017   Procedure: CARDIOVERSION;  Surgeon: [name], MD;  Location: [facility] ENDOSCOPY;  Service: Cardiovascular;  Laterality: N/A;  . COLONOSCOPY  2011  . great toe surgery    . PROSTATE BIOPSY      Social History   Socioeconomic History  . Marital status: Married    Spouse name: Not on file  . Number of children: 1  . Years of education: 26  . Highest education level: Not on file  Occupational History  . Occupation: Retired  Tobacco Use  . Smoking status: Never Smoker  . Smokeless tobacco: Never Used  Vaping Use  . Vaping Use: Never used  Substance and Sexual Activity  . Alcohol use: No  . Drug use: No  . Sexual activity: Not on file  Other Topics Concern  . Not on file  Social History Narrative   Fun: Play golf and build furniture.    Social Determinants of Health   Financial Resource Strain:   . Difficulty of Paying Living Expenses: Not on file  Food Insecurity: No Food  Insecurity  . Worried About Charity fundraiser in the Last Year: Never true  . Ran Out of Food in the Last Year: Never true  Transportation Needs: Unmet Transportation Needs  . Lack of Transportation (Medical): Yes  . Lack of Transportation (Non-Medical): Yes  Physical Activity:   . Days of Exercise per Week: Not on file  . Minutes of Exercise per Session: Not on file  Stress:   . Feeling of Stress : Not on file  Social Connections:   . Frequency of Communication with Friends and Family: Not on file  . Frequency of Social Gatherings with Friends and Family: Not on file  . Attends Religious Services: Not on file  . Active Member of Clubs or Organizations: Not on file  . Attends Archivist Meetings: Not on file  . Marital Status: Not on file   Family History  Problem Relation Age of Onset  . Hypertension Father 55       Deceased  . Heart disease Father   . Lung cancer Mother 72       Deceased  . Healthy Sister        x3  . Healthy Brother        x4  . Heart disease Brother        #5  . Other Daughter        Alpha Thalassemia  . Lupus  Daughter        #2-deceased  . Diabetes Neg Hx   . Heart attack Neg Hx   . Hyperlipidemia Neg Hx   . Sudden death Neg Hx    Allergies  Allergen Reactions  . Crestor [Rosuvastatin Calcium] Shortness Of Breath    Muscle cramps  . Spironolactone Shortness Of Breath  . Terazosin Shortness Of Breath, Palpitations and Other (See Comments)    Nerve pain  . Amiodarone     Dropped pulse real low to 45-50, and made him feel very cold  . Atenolol Other (See Comments)    Drowsiness and "flu sxs"  . Bystolic [Nebivolol Hcl] Other (See Comments)    GI issues  . Hydralazine Other (See Comments)    Joint swelling  . Tizanidine Other (See Comments)    Syncope, Elevated BP/pulse  . Amlodipine Other (See Comments)  . Clonidine Other (See Comments)  . Hydrocodone-Acetaminophen Other (See Comments)  . Levofloxacin Other (See Comments)  .  Losartan Other (See Comments)    Did not work.  Pt states he couldn't take b/c of SE, but can't remember what SE were.  . Maxzide [Hydrochlorothiazide W-Triamterene] Other (See Comments)    Does not tolerate potassium-sparing diuretics  . Olmesartan Other (See Comments)  . Tape Other (See Comments)    Other reaction(s): Unknown  . Tussionex Pennkinetic Er [Hydrocod Polst-Cpm Polst Er] Other (See Comments)    Other reaction(s): Unknown   Patient Care Team    Relationship Specialty Notifications Start End  Brunetta Jeans, [place] PCP - General Physician Assistant  07/23/15   Constance Haw, MD PCP - Electrophysiology Cardiology  08/21/20   [name], Wonda Cheng, MD PCP - Cardiology Cardiology  08/21/20   Irene Shipper, MD Consulting Physician Gastroenterology  07/14/15   [name], Wonda Cheng, MD Consulting Physician Cardiology  05/21/16   Christy Sartorius, MD Referring Physician Urology  05/21/16   Inocencio Homes, DPM Consulting Physician Podiatry  05/21/16   Myrle Sheng, MD Referring Physician Neurosurgery  08/03/17   Tristan Schroeder, DO  Sports Medicine  08/11/18    Comment: pain management  [name], [facility] Pharmacist Pharmacist  02/29/20    Comment: phone number [phone]   AFIB - PAP for Eliquis   CHA2DS2/VAS Stroke Risk Points = 5.Denies any abnormal bruising, bleeding from nose or gums or blood in urine or stool. Patient is currently controlled on the following medications:  . Eliquis 5 mg twice daily  We readdressed patient assistance program for Eliquis. Agreed to fill out paperwork. Advised pt to except to receive application within next 1-2 weeks and provided my direct work cell phone for any questions. Upon review of PAP application, appears patient would qualify.   Plan  Continue current medications.  Hypertension   Office blood pressures are  BP Readings from Last 3 Encounters:  09/11/20 134/76  08/21/20 128/86  07/21/20 130/80   BMP Latest Ref Rng & Units  08/21/2020 07/21/2020 05/15/2020  Glucose 65 - 99 mg/dL 101(H) 90 87  BUN 8 - 27 mg/dL 15 18 10   Creatinine 0.76 - 1.27 mg/dL 1.31(H) 1.49 1.27(H)  BUN/Creat Ratio 10 - 24 11 - -  Sodium 134 - 144 mmol/L 141 139 141  Potassium 3.5 - 5.2 mmol/L 3.5 3.2(L) 3.2(L)  Chloride 96 - 106 mmol/L 101 98 103  CO2 20 - 29 mmol/L 27 31 29   Calcium 8.6 - 10.2 mg/dL 9.4 9.5 9.2   120/80s at home. No  longer taking amiodarone due to tolerability issues - after DC pulse went from 40s-50s to 100-109.  Denies dizziness. Patient currently taking the following medications:   Amlodipine 10 mg daily  Chlorthalidone 25 mg daily   Potassium 20 meq daily   Plan  Continue current medications. Will continue to monitor BP.   Has upcoming visit 10/2020 with PCP  Follow up:  RPH: 6 month phone visit. CPA: patient assistance completion 11/1.  Future Appointments  Date Time Provider [place]  09/25/2020 11:00 AM Malka So R, [place] [facility] None  10/24/2020 10:30 AM Delorse Limber [facility]  11/12/2020  9:30 AM [facility] None  11/12/2020 10:00 AM Orson Slick, MD [facility] None  ______________ Visit Information SDOH (Social Determinants of Health) assessments performed: Yes.Patient agreed to services and verbal consent obtained.   [name], Pharm.D. Clinical Pharmacist [facility] at [facility] [phone]

## 2020-09-24 ENCOUNTER — Telehealth: Payer: Self-pay | Admitting: Physician Assistant

## 2020-09-24 NOTE — Telephone Encounter (Signed)
Called to schedule patient for schedule Annual Wellness Visit.  Please schedule with Nurse Health Advisor Caroleen Hamman, RN at [facility]

## 2020-09-24 NOTE — Telephone Encounter (Signed)
Pt is scheduled for 09/29/20

## 2020-09-25 ENCOUNTER — Ambulatory Visit (HOSPITAL_COMMUNITY)
Admission: RE | Admit: 2020-09-25 | Discharge: 2020-09-25 | Disposition: A | Discharge status: Home / Self Care | Payer: Medicare HMO | Source: Ambulatory Visit | Attending: Physician Assistant | Admitting: Physician Assistant

## 2020-09-25 ENCOUNTER — Encounter (HOSPITAL_COMMUNITY): Payer: Self-pay | Admitting: Physician Assistant

## 2020-09-25 ENCOUNTER — Other Ambulatory Visit: Payer: Self-pay

## 2020-09-25 VITALS — BP 128/90 | HR 102 | Ht 68.0 in | Wt 215.0 lb

## 2020-09-25 DIAGNOSIS — I484 Atypical atrial flutter: Secondary | ICD-10-CM

## 2020-09-25 DIAGNOSIS — Z888 Allergy status to other drugs, medicaments and biological substances status: Secondary | ICD-10-CM | POA: Diagnosis not present

## 2020-09-25 DIAGNOSIS — Z881 Allergy status to other antibiotic agents status: Secondary | ICD-10-CM | POA: Insufficient documentation

## 2020-09-25 DIAGNOSIS — I4819 Other persistent atrial fibrillation: Secondary | ICD-10-CM | POA: Diagnosis present

## 2020-09-25 DIAGNOSIS — E669 Obesity, unspecified: Secondary | ICD-10-CM | POA: Insufficient documentation

## 2020-09-25 DIAGNOSIS — D6869 Other thrombophilia: Secondary | ICD-10-CM | POA: Diagnosis not present

## 2020-09-25 DIAGNOSIS — I10 Essential (primary) hypertension: Secondary | ICD-10-CM | POA: Diagnosis not present

## 2020-09-25 DIAGNOSIS — Z7901 Long term (current) use of anticoagulants: Secondary | ICD-10-CM | POA: Insufficient documentation

## 2020-09-25 DIAGNOSIS — Z6832 Body mass index (BMI) 32.0-32.9, adult: Secondary | ICD-10-CM | POA: Insufficient documentation

## 2020-09-25 DIAGNOSIS — Z79899 Other long term (current) drug therapy: Secondary | ICD-10-CM | POA: Insufficient documentation

## 2020-09-25 DIAGNOSIS — I4892 Unspecified atrial flutter: Secondary | ICD-10-CM | POA: Diagnosis not present

## 2020-09-25 MED ORDER — METOPROLOL SUCCINATE ER 25 MG PO TB24: 12.5000 mg | ORAL_TABLET | Freq: Every day | ORAL | 2 refills | Status: DC

## 2020-09-25 NOTE — Patient Instructions (Signed)
Start metoprolol 1/2 tablet once a day (12.5mg  a day)

## 2020-09-25 NOTE — Progress Notes (Signed)
Primary Care Physician: Brunetta Jeans, PA-C Primary Cardiologist: Dr Acie Fredrickson (remote) Primary Electrophysiologist: Dr [name] Referring Physician: Oda Kilts   Patrick Roth is a 74 y.o. male with a history of HTN, paroxysmal atrial fibrillation, prior CVA, HLD who presents for follow up in the [facility]. He was admitted in 7/17 with syncope felt to be 2/2 orthostatic hypotension. He was found to be in new onset AF with RVR.  Patient is on Eliquis for a CHADS2VASC score of 5. He has also been maintained on amiodarone for rhythm control. He saw Oda Kilts on 08/21/20 and was in atrial flutter at that time. His amiodarone was increased to 200 mg daily. Patient prefers to avoid surgical procedures. He actually stopped amiodarone several months prior to his visit on his own. He reports the medication made him cold and he had episodes of bradycardia. He does have a history of medication non-adherence. He states he "feels better now than ever."  On follow up today, patient reports he has done well since his last visit. He does feel his heart race occasionally, most often when using the bathroom. Zio patch result are pending. He denies any bleeding issues on anticoagulation.   Today, he denies symptoms of chest pain, shortness of breath, orthopnea, PND, lower extremity edema, dizziness, presyncope, syncope, snoring, daytime somnolence, bleeding, or neurologic sequela. The patient is tolerating medications without difficulties and is otherwise without complaint today.    Atrial Fibrillation Risk Factors:  he does not have symptoms or diagnosis of sleep apnea. he does not have a history of rheumatic fever.   he has a BMI of Body mass index is 32.69 kg/m.Marland Kitchen Filed Weights   09/25/20 1011  Weight: 97.5 kg    Family History  Problem Relation Age of Onset  . Hypertension Father 5       Deceased  . Heart disease Father   . Lung cancer Mother 71       Deceased   . Healthy Sister        x3  . Healthy Brother        x4  . Heart disease Brother        #5  . Other Daughter        Alpha Thalassemia  . Lupus Daughter        #2-deceased  . Diabetes Neg Hx   . Heart attack Neg Hx   . Hyperlipidemia Neg Hx   . Sudden death Neg Hx      Atrial Fibrillation Management history:  Previous antiarrhythmic drugs: amiodarone Previous cardioversions: 2018 Previous ablations: none CHADS2VASC score: 5 Anticoagulation history: Xarelto, Eliquis   Past Medical History:  Diagnosis Date  . Atrial flutter ([place])   . Colon polyps   . Constipation 05/27/2015  . DOE (dyspnea on exertion) 10/13/2015  . Faintness 06/20/2016  . History of chicken pox   . Hyperlipemia 02/13/2016  . Hyperlipidemia   . Hypertension   . Hypokalemia 06/28/2016  . Hyponatremia 06/28/2016  . Internal hemorrhoids   . Irregular heartbeat   . Malignant hypertension 07/15/2015  . Sciatica   . Stroke [facility]) 2008  . Syncope and collapse 06/28/2016   Past Surgical History:  Procedure Laterality Date  . CARDIOVERSION N/A 09/02/2017   Procedure: CARDIOVERSION;  Surgeon: [name], MD;  Location: [facility] ENDOSCOPY;  Service: Cardiovascular;  Laterality: N/A;  . COLONOSCOPY  2011  . great toe surgery    . PROSTATE BIOPSY  Current Outpatient Medications  Medication Sig Dispense Refill  . amLODipine (NORVASC) 10 MG tablet Take 10 mg by mouth daily.    . chlorthalidone (HYGROTON) 25 MG tablet Take 1 tablet by mouth once daily 90 tablet 0  . ELIQUIS 5 MG TABS tablet Take 1 tablet by mouth twice daily 60 tablet 5  . gabapentin (NEURONTIN) 100 MG capsule Take 1 capsule by mouth 4 times daily 360 capsule 0  . oxyCODONE-acetaminophen (PERCOCET/ROXICET) 5-325 MG tablet Take 1 tablet by mouth every 4 (four) hours as needed for severe pain. 12 tablet 0  . potassium chloride SA (KLOR-CON) 20 MEQ tablet TAKE 1 TABLET BY MOUTH IN THE MORNING AND 2 IN THE EVENING 270 tablet 1  . psyllium (REGULOID)  0.52 g capsule Take by mouth.    . metoprolol succinate (TOPROL XL) 25 MG 24 hr tablet Take 0.5 tablets (12.5 mg total) by mouth daily. 30 tablet 2   No current facility-administered medications for this encounter.    Allergies  Allergen Reactions  . Crestor [Rosuvastatin Calcium] Shortness Of Breath    Muscle cramps  . Spironolactone Shortness Of Breath  . Terazosin Shortness Of Breath, Palpitations and Other (See Comments)    Nerve pain  . Amiodarone     Dropped pulse real low to 45-50, and made him feel very cold  . Atenolol Other (See Comments)    Drowsiness and "flu sxs"  . Bystolic [Nebivolol Hcl] Other (See Comments)    GI issues  . Hydralazine Other (See Comments)    Joint swelling  . Tizanidine Other (See Comments)    Syncope, Elevated BP/pulse  . Amlodipine Other (See Comments)  . Clonidine Other (See Comments)  . Hydrocodone-Acetaminophen Other (See Comments)  . Levofloxacin Other (See Comments)  . Losartan Other (See Comments)    Did not work.  Pt states he couldn't take b/c of SE, but can't remember what SE were.  . Maxzide [Hydrochlorothiazide W-Triamterene] Other (See Comments)    Does not tolerate potassium-sparing diuretics  . Olmesartan Other (See Comments)  . Tape Other (See Comments)    Other reaction(s): Unknown  . Tussionex Pennkinetic Er [Hydrocod Polst-Cpm Polst Er] Other (See Comments)    Other reaction(s): Unknown    Social History   Socioeconomic History  . Marital status: Married    Spouse name: Not on file  . Number of children: 1  . Years of education: 60  . Highest education level: Not on file  Occupational History  . Occupation: Retired  Tobacco Use  . Smoking status: Never Smoker  . Smokeless tobacco: Never Used  Vaping Use  . Vaping Use: Never used  Substance and Sexual Activity  . Alcohol use: No  . Drug use: No  . Sexual activity: Not on file  Other Topics Concern  . Not on file  Social History Narrative   Fun: Play golf  and build furniture.    Social Determinants of Health   Financial Resource Strain:   . Difficulty of Paying Living Expenses: Not on file  Food Insecurity: No Food Insecurity  . Worried About Charity fundraiser in the Last Year: Never true  . Ran Out of Food in the Last Year: Never true  Transportation Needs: Unmet Transportation Needs  . Lack of Transportation (Medical): Yes  . Lack of Transportation (Non-Medical): Yes  Physical Activity:   . Days of Exercise per Week: Not on file  . Minutes of Exercise per Session: Not on file  Stress:   .  Feeling of Stress : Not on file  Social Connections:   . Frequency of Communication with Friends and Family: Not on file  . Frequency of Social Gatherings with Friends and Family: Not on file  . Attends Religious Services: Not on file  . Active Member of Clubs or Organizations: Not on file  . Attends Archivist Meetings: Not on file  . Marital Status: Not on file  Intimate Partner Violence:   . Fear of Current or Ex-Partner: Not on file  . Emotionally Abused: Not on file  . Physically Abused: Not on file  . Sexually Abused: Not on file     ROS- All systems are reviewed and negative except as per the HPI above.  Physical Exam: Vitals:   09/25/20 1011  BP: 128/90  Pulse: (!) 102  Weight: 97.5 kg  Height: 5\' 8"  (1.727 m)    GEN- The patient is well appearing obese elderly male, alert and oriented x 3 today.   HEENT-head normocephalic, atraumatic, sclera clear, conjunctiva pink, hearing intact, trachea midline. Lungs- Clear to ausculation bilaterally, normal work of breathing Heart- irregular rate and rhythm, no murmurs, rubs or gallops  GI- soft, NT, ND, + BS Extremities- no clubbing, cyanosis, or edema MS- no significant deformity or atrophy Skin- no rash or lesion Psych- euthymic mood, full affect Neuro- strength and sensation are intact   Wt Readings from Last 3 Encounters:  09/25/20 97.5 kg  09/11/20 96.5 kg    08/21/20 97.9 kg    EKG today demonstrates atypical flutter vs coarse afib HR 102, RBBB, QRS 146, QTc 544  Echo 02/24/18 demonstrated  - Left ventricle: The cavity size was normal. Systolic function was  normal. The estimated ejection fraction was in the range of 55%  to 60%. Wall motion was normal; there were no regional wall  motion abnormalities. There was a reduced contribution of atrial  contraction to ventricular filling, due to increased ventricular  diastolic pressure or atrial contractile dysfunction. The study  is not technically sufficient to allow evaluation of LV diastolic  function. (suspect left atrial mechanical failure, possible  stunning after recent episode of atrial fibrillation versus  chronic atrial dysfunction)  - Left atrium: The atrium was mildly dilated.  - Right ventricle: The cavity size was mildly dilated. Wall  thickness was normal.  - Right atrium: The atrium was mildly dilated.  - Pulmonary arteries: Systolic pressure was moderately increased.  PA peak pressure: 60 mm Hg (S).  Epic records are reviewed at length today  CHA2DS2-VASc Score = 5  The patient's score is based upon: CHF History: 0 HTN History: 1 Diabetes History: 0 Stroke History: 2 Vascular Disease History: 0 Age Score: 2 Gender Score: 0      ASSESSMENT AND PLAN: 1. Persistent Atrial Fibrillation/atrial flutter The patient's CHA2DS2-VASc score is 5, indicating a 7.2% annual risk of stroke.   Zio patch results pending. We discussed importance of rate control today. He is willing to try metoprolol. Start Toprol 12.5 mg daily Recall he declined consideration for ablation. Continue Eliquis 5 mg BID  2. Secondary Hypercoagulable State (ICD10:  D68.69) The patient is at significant risk for stroke/thromboembolism based upon his CHA2DS2-VASc Score of 5.  Continue Apixaban (Eliquis).   3. Obesity Body mass index is 32.69 kg/m. Lifestyle modification was  discussed and encouraged including regular physical activity and weight reduction.  4. HTN Stable, med changes as above.   Follow up in the AF clinic in 3 weeks.  [facility] [address] [place], [place] [address] [phone] 09/25/2020 10:38 AM

## 2020-09-29 ENCOUNTER — Ambulatory Visit: Payer: Medicare HMO

## 2020-09-29 ENCOUNTER — Telehealth: Payer: Self-pay

## 2020-09-29 NOTE — Telephone Encounter (Signed)
Attempted to reach patient x 3 for 12:45 wellness visit. No answer. Left message for pt to call back.

## 2020-10-02 ENCOUNTER — Telehealth: Payer: Self-pay

## 2020-10-02 NOTE — Progress Notes (Signed)
Patient called and left message regarding Patient assistance application for Eliquis. Attempted to return patients call and got directed to patients voicemail message.  Left patient a message so he could return my call regarding patient assistance application for eliquis.  Georgiana Shore ,[place] Pharmacist Assistant [phone]

## 2020-10-03 NOTE — Progress Notes (Signed)
Subjective:   Patrick Roth is a 73 y.o. male who presents for Medicare Annual/Subsequent preventive examination.  I connected with Ulysse today by telephone and verified that I am speaking with the correct person using two identifiers. Location patient: home Location provider: work Persons participating in the virtual visit: patient, Marine scientist.    I discussed the limitations, risks, security and privacy concerns of performing an evaluation and management service by telephone and the availability of in person appointments. I also discussed with the patient that there may be a patient responsible charge related to this service. The patient expressed understanding and verbally consented to this telephonic visit.    Interactive audio and video telecommunications were attempted between this provider and patient, however failed, due to patient having technical difficulties OR patient did not have access to video capability.  We continued and completed visit with audio only.  Some vital signs may be absent or patient reported.   Time Spent with patient on telephone encounter: 25 minutes   Review of Systems     Cardiac Risk Factors include: advanced age (>21men, >53 women);male gender;hypertension;dyslipidemia;obesity (BMI >30kg/m2)     Objective:    Today's Vitals   10/06/20 1500  Weight: 215 lb (97.5 kg)  Height: 5\' 8"  (1.727 m)   Body mass index is 32.69 kg/m.  Advanced Directives 10/06/2020 01/24/2020 01/18/2020 01/15/2020 01/02/2020 11/14/2019 08/11/2018  Does Patient Have a Medical Advance Directive? No No No No No No No  Would patient like information on creating a medical advance directive? Yes (MAU/Ambulatory/Procedural Areas - Information given) No - Patient declined - No - Patient declined No - Patient declined No - Guardian declined No - Patient declined    Current Medications (verified) Outpatient Encounter Medications as of 10/06/2020  Medication Sig  . amLODipine (NORVASC) 10  MG tablet Take 10 mg by mouth daily.  . chlorthalidone (HYGROTON) 25 MG tablet Take 1 tablet by mouth once daily  . ELIQUIS 5 MG TABS tablet Take 1 tablet by mouth twice daily  . gabapentin (NEURONTIN) 100 MG capsule Take 1 capsule by mouth 4 times daily  . metoprolol succinate (TOPROL XL) 25 MG 24 hr tablet Take 0.5 tablets (12.5 mg total) by mouth daily.  Marland Kitchen oxyCODONE-acetaminophen (PERCOCET/ROXICET) 5-325 MG tablet Take 1 tablet by mouth every 4 (four) hours as needed for severe pain.  . potassium chloride SA (KLOR-CON) 20 MEQ tablet TAKE 1 TABLET BY MOUTH IN THE MORNING AND 2 IN THE EVENING  . psyllium (REGULOID) 0.52 g capsule Take by mouth.  . [DISCONTINUED] chlorthalidone (HYGROTON) 25 MG tablet Take 1 tablet by mouth once daily   No facility-administered encounter medications on file as of 10/06/2020.    Allergies (verified) Crestor [rosuvastatin calcium], Spironolactone, Terazosin, Amiodarone, Atenolol, Bystolic [nebivolol hcl], Hydralazine, Tizanidine, Amlodipine, Clonidine, Hydrocodone-acetaminophen, Levofloxacin, Losartan, Maxzide [hydrochlorothiazide w-triamterene], Olmesartan, Tape, and Tussionex pennkinetic er [hydrocod polst-cpm polst er]   History: Past Medical History:  Diagnosis Date  . Atrial flutter ([place])   . Colon polyps   . Constipation 05/27/2015  . DOE (dyspnea on exertion) 10/13/2015  . Faintness 06/20/2016  . History of chicken pox   . Hyperlipemia 02/13/2016  . Hyperlipidemia   . Hypertension   . Hypokalemia 06/28/2016  . Hyponatremia 06/28/2016  . Internal hemorrhoids   . Irregular heartbeat   . Malignant hypertension 07/15/2015  . Sciatica   . Stroke [facility]) 2008  . Syncope and collapse 06/28/2016   Past Surgical History:  Procedure Laterality Date  .  CARDIOVERSION N/A 09/02/2017   Procedure: CARDIOVERSION;  Surgeon: [name], MD;  Location: [facility] ENDOSCOPY;  Service: Cardiovascular;  Laterality: N/A;  . COLONOSCOPY  2011  . great toe surgery    .  PROSTATE BIOPSY     Family History  Problem Relation Age of Onset  . Hypertension Father 67       Deceased  . Heart disease Father   . Lung cancer Mother 75       Deceased  . Healthy Sister        x3  . Healthy Brother        x4  . Heart disease Brother        #5  . Other Daughter        Alpha Thalassemia  . Lupus Daughter        #2-deceased  . Diabetes Neg Hx   . Heart attack Neg Hx   . Hyperlipidemia Neg Hx   . Sudden death Neg Hx    Social History   Socioeconomic History  . Marital status: Married    Spouse name: Not on file  . Number of children: 1  . Years of education: 44  . Highest education level: Not on file  Occupational History  . Occupation: Retired  Tobacco Use  . Smoking status: Never Smoker  . Smokeless tobacco: Never Used  Vaping Use  . Vaping Use: Never used  Substance and Sexual Activity  . Alcohol use: No  . Drug use: No  . Sexual activity: Not on file  Other Topics Concern  . Not on file  Social History Narrative   Fun: Play golf and build furniture.    Social Determinants of Health   Financial Resource Strain: Low Risk   . Difficulty of Paying Living Expenses: Not hard at all  Food Insecurity: No Food Insecurity  . Worried About Charity fundraiser in the Last Year: Never true  . Ran Out of Food in the Last Year: Never true  Transportation Needs: No Transportation Needs  . Lack of Transportation (Medical): No  . Lack of Transportation (Non-Medical): No  Physical Activity: Sufficiently Active  . Days of Exercise per Week: 7 days  . Minutes of Exercise per Session: 30 min  Stress: No Stress Concern Present  . Feeling of Stress : Not at all  Social Connections: Moderately Isolated  . Frequency of Communication with Friends and Family: More than three times a week  . Frequency of Social Gatherings with Friends and Family: Once a week  . Attends Religious Services: Never  . Active Member of Clubs or Organizations: No  . Attends English as a second language teacher Meetings: Never  . Marital Status: Married    Tobacco Counseling Counseling given: Not Answered   Clinical Intake:  Pre-visit preparation completed: Yes  Pain : No/denies pain     Nutritional Status: BMI > 30  Obese Nutritional Risks: None Diabetes: No  How often do you need to have someone help you when you read instructions, pamphlets, or other written materials from your doctor or pharmacy?: 1 - Never What is the last grade level you completed in school?: 11th grade  Diabetic?No  Interpreter Needed?: No  Information entered by :: [place] of Daily Living In your present state of health, do you have any difficulty performing the following activities: 10/06/2020 10/23/2019  Hearing? N N  Vision? N N  Difficulty concentrating or making decisions? N N  Walking or climbing stairs?  N N  Dressing or bathing? N N  Doing errands, shopping? N N  Preparing Food and eating ? N -  Using the Toilet? N -  In the past six months, have you accidently leaked urine? N -  Do you have problems with loss of bowel control? N -  Managing your Medications? N -  Managing your Finances? N -  Housekeeping or managing your Housekeeping? N -  Some recent data might be hidden    Patient Care Team: Delorse Limber as PCP - General (Physician Assistant) Constance Haw, MD as PCP - Electrophysiology (Cardiology) [name], Wonda Cheng, MD as PCP - Cardiology (Cardiology) Irene Shipper, MD as Consulting Physician (Gastroenterology) [name], Wonda Cheng, MD as Consulting Physician (Cardiology) Christy Sartorius, MD as Referring Physician (Urology) Inocencio Homes, [place] as Consulting Physician (Podiatry) Myrle Sheng, MD as Referring Physician (Neurosurgery) Begovich, Geradine Girt, DO (Sports Medicine) [name], [facility] as Pharmacist (Pharmacist)  Indicate any recent Medical Services you may have received from other than [facility] providers in the past  year (date may be approximate).     Assessment:   This is a routine wellness examination for Stanlee.  Hearing/Vision screen  Hearing Screening   125Hz  250Hz  500Hz  1000Hz  2000Hz  3000Hz  4000Hz  6000Hz  8000Hz   Right ear:           Left ear:           Comments: No issues  Vision Screening Comments: Wears glasses Last eye exam-2020  Dietary issues and exercise activities discussed: Current Exercise Habits: Home exercise routine, Type of exercise: walking, Time (Minutes): 30, Frequency (Times/Week): 7, Weekly Exercise (Minutes/Week): 210, Intensity: Mild, Exercise limited by: None identified  Goals      Patient Stated   .  patient (pt-stated)      Maintain current health by staying active.       Other   .  BP <140/90      CARE PLAN ENTRY (see longitudinal plan of care for additional care plan information) Current Barriers:  . Current antihypertensive regimen:   Amlodipine 10 mg daily  Chlorthalidone 25 mg daily   Potassium 20 meq daily   BP Readings from Last 3 Encounters:  02/13/20 (!) 157/89  01/24/20 (!) 153/94  01/18/20 (!) 148/78   . Current home BP readings: n/a.  Pharmacist Clinical Goal(s):  Marland Kitchen Over the next 180 days, patient will work with PharmD and providers to optimize antihypertensive regimen.  Interventions: . Inter-disciplinary care team collaboration (see longitudinal plan of care). . Comprehensive medication review performed; medication list updated in the electronic medical record.   Patient Self Care Activities:  . Consider checking BP at home 1-2x/week, document, and provide at future appointments. . Patient will focus on medication adherence by continuing current medication management.  Initial goal documentation.     .  Eliquis PAP      CARE PLAN ENTRY (see longitudinal plan of care for additional care plan information) Current Barriers:  . Current Afib anticoagulation regimen:   Pharmacist Clinical Goal(s):  Marland Kitchen Over the next 180 days,  patient will work with PharmD and providers to optimize Afib regimen as appropriate. o Patient assistance completion for Eliquis  Interventions: . Inter-disciplinary care team collaboration (see longitudinal plan of care). . Comprehensive medication review performed; medication list updated in the electronic medical record.   Patient Self Care Activities:  . Complete pt section of Eliquis patient assistance application and return to cardiologist. . Patient will focus on  medication adherence by continuing current medication management.  Initial goal documentation.    .  Total Cholesterol <200      CARE PLAN ENTRY (see longitudinal plan of care for additional care plan information) Current Barriers:  . Current antihyperlipidemic regimen:  o Not able to confirm today. . Previous antihyperlipidemic medications tried:  o Pravastatin, atorvastatin, simvastatin, rosuvastatin, Zetia. . Most recent lipid panel:     Component Value Date/Time   CHOL 253 (H) 10/23/2019 1000   CHOL 263 (H) 04/20/2019 0919   TRIG 138.0 10/23/2019 1000   HDL 43.30 10/23/2019 1000   HDL 45 04/20/2019 0919   CHOLHDL 6 10/23/2019 1000   VLDL 27.6 10/23/2019 1000   LDLCALC 182 (H) 10/23/2019 1000   LDLCALC 197 (H) 04/20/2019 0919   . ASCVD risk enhancing conditions: age >25, HTN.  Pharmacist Clinical Goal(s):  Marland Kitchen Over the next 180 days, patient will work with PharmD and providers towards optimized antihyperlipidemic therapy  Interventions: . Comprehensive medication review performed; medication list updated in electronic medical record.  Bertram Savin care team collaboration (see longitudinal plan of care)  Patient Self Care Activities:  . Patient will focus on medication adherence by continuing current management.   Initial goal documentation.      Depression Screen PHQ 2/9 Scores 10/06/2020 10/23/2019 08/11/2018 08/03/2017 11/30/2016 11/30/2016 01/29/2016  PHQ - 2 Score 0 0 0 0 0 0 0  PHQ- 9 Score -  0 - - - 0 -    Fall Risk Fall Risk  10/06/2020 10/23/2019 08/11/2018 08/03/2017 11/30/2016  Falls in the past year? 0 0 No No No  Number falls in past yr: 0 0 - - -  Injury with Fall? 0 0 - - -  Follow up Falls prevention discussed Falls evaluation completed - - -    Any stairs in or around the home? Yes  If so, are there any without handrails? No  Home free of loose throw rugs in walkways, pet beds, electrical cords, etc? Yes  Adequate lighting in your home to reduce risk of falls? Yes   ASSISTIVE DEVICES UTILIZED TO PREVENT FALLS:  Life alert? No  Use of a cane, walker or w/c? No  Grab bars in the bathroom? Yes  Shower chair or bench in shower? No  Elevated toilet seat or a handicapped toilet? No   TIMED UP AND GO:  Was the test performed? No .  Phone visit   Cognitive Function:No cognitive impairment noted MMSE - Mini Mental State Exam 08/11/2018 08/03/2017  Orientation to time 5 5  Orientation to Place 5 5  Registration 3 3  Attention/ Calculation 5 2  Recall 1 3  Language- name 2 objects 2 2  Language- repeat 1 1  Language- follow 3 step command 3 3  Language- read & follow direction 1 1  Write a sentence 1 1  Copy design 1 1  Total score 28 27        Immunizations Immunization History  Administered Date(s) Administered  . Influenza,inj,Quad PF,6+ Mos 07/23/2015  . Influenza-Unspecified 07/23/2018  . PFIZER SARS-COV-2 Vaccination 12/24/2019, 01/21/2020, 09/06/2020  . Tdap 07/05/2013    TDAP status: Up to date   Flu Vaccine status: Declined, Education has been provided regarding the importance of this vaccine but patient still declined. Advised may receive this vaccine at local pharmacy or Health Dept. Aware to provide a copy of the vaccination record if obtained from local pharmacy or Health Dept. Verbalized acceptance and understanding.   Pneumococcal  vaccine status: Declined,  Education has been provided regarding the importance of this vaccine but patient  still declined. Advised may receive this vaccine at local pharmacy or Health Dept. Aware to provide a copy of the vaccination record if obtained from local pharmacy or Health Dept. Verbalized acceptance and understanding.    Covid-19 vaccine status: Completed vaccines  Qualifies for Shingles Vaccine? Yes   Zostavax completed No   Shingrix Completed?: No.    Education has been provided regarding the importance of this vaccine. Patient has been advised to call insurance company to determine out of pocket expense if they have not yet received this vaccine. Advised may also receive vaccine at local pharmacy or Health Dept. Verbalized acceptance and understanding.  Screening Tests Health Maintenance  Topic Date Due  . PNA vac Low Risk Adult (1 of 2 - PCV13) Never done  . INFLUENZA VACCINE  06/22/2020  . DTAP VACCINES (1) 02/03/2079 (Originally 04/05/1946)  . COLONOSCOPY  10/26/2020  . DTaP/Tdap/Td (2 - Td or Tdap) 07/06/2023  . TETANUS/TDAP  07/06/2023  . COVID-19 Vaccine  Completed  . Hepatitis C Screening  Completed    Health Maintenance  Health Maintenance Due  Topic Date Due  . PNA vac Low Risk Adult (1 of 2 - PCV13) Never done  . INFLUENZA VACCINE  06/22/2020    Colorectal cancer screening: Completed 10/26/2010. Repeat every 10 years Patient is in the process of scheduling an appt.  Lung Cancer Screening: (Low Dose CT Chest recommended if Age 32-80 years, 30 pack-year currently smoking OR have quit w/in 15years.) does not qualify.     Additional Screening:  Hepatitis C Screening:  Completed 11/14/2019  Vision Screening: Recommended annual ophthalmology exams for early detection of glaucoma and other disorders of the eye. Is the patient up to date with their annual eye exam?  Yes  Who is the provider or what is the name of the office in which the patient attends annual eye exams? Pt unsure of name   Dental Screening: Recommended annual dental exams for proper oral  hygiene  Community Resource Referral / Chronic Care Management: CRR required this visit?  No   CCM required this visit?  No      Plan:     I have personally reviewed and noted the following in the patient's chart:   . Medical and social history . Use of alcohol, tobacco or illicit drugs  . Current medications and supplements . Functional ability and status . Nutritional status . Physical activity . Advanced directives . List of other physicians . Hospitalizations, surgeries, and ER visits in previous 12 months . Vitals . Screenings to include cognitive, depression, and falls . Referrals and appointments  In addition, I have reviewed and discussed with patient certain preventive protocols, quality metrics, and best practice recommendations. A written personalized care plan for preventive services as well as general preventive health recommendations were provided to patient.   Due to this being a telephonic visit, the after visit summary with patients personalized plan was offered to patient via mail or my-chart.  Per request, copy mailed to patient.   Marta Antu, LPN   [id number]  Nurse Health Advisor  Nurse Notes: None

## 2020-10-05 ENCOUNTER — Other Ambulatory Visit: Payer: Self-pay | Admitting: Physician Assistant

## 2020-10-06 ENCOUNTER — Ambulatory Visit (INDEPENDENT_AMBULATORY_CARE_PROVIDER_SITE_OTHER): Payer: Medicare HMO

## 2020-10-06 VITALS — Ht 68.0 in | Wt 215.0 lb

## 2020-10-06 DIAGNOSIS — Z0000 Encounter for general adult medical examination without abnormal findings: Secondary | ICD-10-CM

## 2020-10-06 NOTE — Patient Instructions (Signed)
Patrick Roth , Thank you for taking time to complete your Medicare Wellness Visit. I appreciate your ongoing commitment to your health goals. Please review the following plan we discussed and let me know if I can assist you in the future.   Screening recommendations/referrals: Colonoscopy:Completed 10/26/2010-Due 10/26/2020- Per our conversation today, you are in the process of scheduling. Recommended yearly ophthalmology/optometry visit for glaucoma screening and checkup Recommended yearly dental visit for hygiene and checkup  Vaccinations: Influenza vaccine: Declined Pneumococcal vaccine: Declined Tdap vaccine: Up to date-Due-07/06/2023 Shingles vaccine: Discuss with pharmacy   Covid-19:Completed vaccines  Advanced directives: Information mailed today.  Conditions/risks identified: See problem list  Next appointment: Follow up in one year for your annual wellness visit. 10/12/2021 @ 3:00pm  Preventive Care 65 Years and Older, Male Preventive care refers to lifestyle choices and visits with your health care provider that can promote health and wellness. What does preventive care include?  A yearly physical exam. This is also called an annual well check.  Dental exams once or twice a year.  Routine eye exams. Ask your health care provider how often you should have your eyes checked.  Personal lifestyle choices, including:  Daily care of your teeth and gums.  Regular physical activity.  Eating a healthy diet.  Avoiding tobacco and drug use.  Limiting alcohol use.  Practicing safe sex.  Taking low doses of aspirin every day.  Taking vitamin and mineral supplements as recommended by your health care provider. What happens during an annual well check? The services and screenings done by your health care provider during your annual well check will depend on your age, overall health, lifestyle risk factors, and family history of disease. Counseling  Your health care provider  may ask you questions about your:  Alcohol use.  Tobacco use.  Drug use.  Emotional well-being.  Home and relationship well-being.  Sexual activity.  Eating habits.  History of falls.  Memory and ability to understand (cognition).  Work and work Statistician. Screening  You may have the following tests or measurements:  Height, weight, and BMI.  Blood pressure.  Lipid and cholesterol levels. These may be checked every 5 years, or more frequently if you are over 34 years old.  Skin check.  Lung cancer screening. You may have this screening every year starting at age 81 if you have a 30-pack-year history of smoking and currently smoke or have quit within the past 15 years.  Fecal occult blood test (FOBT) of the stool. You may have this test every year starting at age 64.  Flexible sigmoidoscopy or colonoscopy. You may have a sigmoidoscopy every 5 years or a colonoscopy every 10 years starting at age 54.  Prostate cancer screening. Recommendations will vary depending on your family history and other risks.  Hepatitis C blood test.  Hepatitis B blood test.  Sexually transmitted disease (STD) testing.  Diabetes screening. This is done by checking your blood sugar (glucose) after you have not eaten for a while (fasting). You may have this done every 1-3 years.  Abdominal aortic aneurysm (AAA) screening. You may need this if you are a current or former smoker.  Osteoporosis. You may be screened starting at age 50 if you are at high risk. Talk with your health care provider about your test results, treatment options, and if necessary, the need for more tests. Vaccines  Your health care provider may recommend certain vaccines, such as:  Influenza vaccine. This is recommended every year.  Tetanus, diphtheria,  and acellular pertussis (Tdap, Td) vaccine. You may need a Td booster every 10 years.  Zoster vaccine. You may need this after age 61.  Pneumococcal 13-valent  conjugate (PCV13) vaccine. One dose is recommended after age 45.  Pneumococcal polysaccharide (PPSV23) vaccine. One dose is recommended after age 95. Talk to your health care provider about which screenings and vaccines you need and how often you need them. This information is not intended to replace advice given to you by your health care provider. Make sure you discuss any questions you have with your health care provider. Document Released: 12/05/2015 Document Revised: 07/28/2016 Document Reviewed: 09/09/2015 Elsevier Interactive Patient Education  2017 [employer] Prevention in the Home Falls can cause injuries. They can happen to people of all ages. There are many things you can do to make your home safe and to help prevent falls. What can I do on the outside of my home?  Regularly fix the edges of walkways and driveways and fix any cracks.  Remove anything that might make you trip as you walk through a door, such as a raised step or threshold.  Trim any bushes or trees on the path to your home.  Use bright outdoor lighting.  Clear any walking paths of anything that might make someone trip, such as rocks or tools.  Regularly check to see if handrails are loose or broken. Make sure that both sides of any steps have handrails.  Any raised decks and porches should have guardrails on the edges.  Have any leaves, snow, or ice cleared regularly.  Use sand or salt on walking paths during winter.  Clean up any spills in your garage right away. This includes oil or grease spills. What can I do in the bathroom?  Use night lights.  Install grab bars by the toilet and in the tub and shower. Do not use towel bars as grab bars.  Use non-skid mats or decals in the tub or shower.  If you need to sit down in the shower, use a plastic, non-slip stool.  Keep the floor dry. Clean up any water that spills on the floor as soon as it happens.  Remove soap buildup in the tub or  shower regularly.  Attach bath mats securely with double-sided non-slip rug tape.  Do not have throw rugs and other things on the floor that can make you trip. What can I do in the bedroom?  Use night lights.  Make sure that you have a light by your bed that is easy to reach.  Do not use any sheets or blankets that are too big for your bed. They should not hang down onto the floor.  Have a firm chair that has side arms. You can use this for support while you get dressed.  Do not have throw rugs and other things on the floor that can make you trip. What can I do in the kitchen?  Clean up any spills right away.  Avoid walking on wet floors.  Keep items that you use a lot in easy-to-reach places.  If you need to reach something above you, use a strong step stool that has a grab bar.  Keep electrical cords out of the way.  Do not use floor polish or wax that makes floors slippery. If you must use wax, use non-skid floor wax.  Do not have throw rugs and other things on the floor that can make you trip. What can I do with my  stairs?  Do not leave any items on the stairs.  Make sure that there are handrails on both sides of the stairs and use them. Fix handrails that are broken or loose. Make sure that handrails are as long as the stairways.  Check any carpeting to make sure that it is firmly attached to the stairs. Fix any carpet that is loose or worn.  Avoid having throw rugs at the top or bottom of the stairs. If you do have throw rugs, attach them to the floor with carpet tape.  Make sure that you have a light switch at the top of the stairs and the bottom of the stairs. If you do not have them, ask someone to add them for you. What else can I do to help prevent falls?  Wear shoes that:  Do not have high heels.  Have rubber bottoms.  Are comfortable and fit you well.  Are closed at the toe. Do not wear sandals.  If you use a stepladder:  Make sure that it is fully  opened. Do not climb a closed stepladder.  Make sure that both sides of the stepladder are locked into place.  Ask someone to hold it for you, if possible.  Clearly mark and make sure that you can see:  Any grab bars or handrails.  First and last steps.  Where the edge of each step is.  Use tools that help you move around (mobility aids) if they are needed. These include:  Canes.  Walkers.  Scooters.  Crutches.  Turn on the lights when you go into a dark area. Replace any light bulbs as soon as they burn out.  Set up your furniture so you have a clear path. Avoid moving your furniture around.  If any of your floors are uneven, fix them.  If there are any pets around you, be aware of where they are.  Review your medicines with your doctor. Some medicines can make you feel dizzy. This can increase your chance of falling. Ask your doctor what other things that you can do to help prevent falls. This information is not intended to replace advice given to you by your health care provider. Make sure you discuss any questions you have with your health care provider. Document Released: 09/04/2009 Document Revised: 04/15/2016 Document Reviewed: 12/13/2014 Elsevier Interactive Patient Education  2017 [employer].

## 2020-10-20 ENCOUNTER — Encounter (HOSPITAL_COMMUNITY): Payer: Self-pay | Admitting: Physician Assistant

## 2020-10-20 ENCOUNTER — Ambulatory Visit (HOSPITAL_COMMUNITY)
Admission: RE | Admit: 2020-10-20 | Discharge: 2020-10-20 | Disposition: A | Discharge status: Home / Self Care | Payer: Medicare HMO | Source: Ambulatory Visit | Attending: Physician Assistant | Admitting: Physician Assistant

## 2020-10-20 ENCOUNTER — Other Ambulatory Visit: Payer: Self-pay

## 2020-10-20 VITALS — BP 128/76 | HR 55 | Ht 68.0 in | Wt 212.6 lb

## 2020-10-20 DIAGNOSIS — Z8249 Family history of ischemic heart disease and other diseases of the circulatory system: Secondary | ICD-10-CM | POA: Insufficient documentation

## 2020-10-20 DIAGNOSIS — Z7901 Long term (current) use of anticoagulants: Secondary | ICD-10-CM | POA: Diagnosis not present

## 2020-10-20 DIAGNOSIS — E669 Obesity, unspecified: Secondary | ICD-10-CM | POA: Insufficient documentation

## 2020-10-20 DIAGNOSIS — Z8673 Personal history of transient ischemic attack (TIA), and cerebral infarction without residual deficits: Secondary | ICD-10-CM | POA: Diagnosis not present

## 2020-10-20 DIAGNOSIS — Z6832 Body mass index (BMI) 32.0-32.9, adult: Secondary | ICD-10-CM | POA: Diagnosis not present

## 2020-10-20 DIAGNOSIS — Z885 Allergy status to narcotic agent status: Secondary | ICD-10-CM | POA: Diagnosis not present

## 2020-10-20 DIAGNOSIS — I4819 Other persistent atrial fibrillation: Secondary | ICD-10-CM

## 2020-10-20 DIAGNOSIS — I4892 Unspecified atrial flutter: Secondary | ICD-10-CM | POA: Insufficient documentation

## 2020-10-20 DIAGNOSIS — I10 Essential (primary) hypertension: Secondary | ICD-10-CM | POA: Insufficient documentation

## 2020-10-20 DIAGNOSIS — Z888 Allergy status to other drugs, medicaments and biological substances status: Secondary | ICD-10-CM | POA: Diagnosis not present

## 2020-10-20 DIAGNOSIS — D6869 Other thrombophilia: Secondary | ICD-10-CM | POA: Insufficient documentation

## 2020-10-20 DIAGNOSIS — Z79899 Other long term (current) drug therapy: Secondary | ICD-10-CM | POA: Insufficient documentation

## 2020-10-20 DIAGNOSIS — Z881 Allergy status to other antibiotic agents status: Secondary | ICD-10-CM | POA: Diagnosis not present

## 2020-10-20 NOTE — Progress Notes (Signed)
Primary Care Physician: Brunetta Jeans, PA-C Primary Cardiologist: Dr Acie Fredrickson (remote) Primary Electrophysiologist: Dr [name] Referring Physician: Oda Kilts   Patrick Roth is a 74 y.o. male with a history of HTN, paroxysmal atrial fibrillation, prior CVA, HLD who presents for follow up in the [facility]. He was admitted in 7/17 with syncope felt to be 2/2 orthostatic hypotension. He was found to be in new onset AF with RVR.  Patient is on Eliquis for a CHADS2VASC score of 5. He has also been maintained on amiodarone for rhythm control. He saw Oda Kilts on 08/21/20 and was in atrial flutter at that time. His amiodarone was increased to 200 mg daily. Patient prefers to avoid surgical procedures. He actually stopped amiodarone several months prior to his visit on his own. He reports the medication made him cold and he had episodes of bradycardia. He does have a history of medication non-adherence. He states he "feels better now than ever."  On follow up today, Zio patch showed 100% afib burden with overall elevated V rates. He was started on metoprolol at his last visit and he noted he was back in SR later that day. He does feel well on the medication but admits he did not really feel bad in afib. He denies any bleeding issues on anticoagulation.   Today, he denies symptoms of palpitations, chest pain, shortness of breath, orthopnea, PND, lower extremity edema, dizziness, presyncope, syncope, snoring, daytime somnolence, bleeding, or neurologic sequela. The patient is tolerating medications without difficulties and is otherwise without complaint today.    Atrial Fibrillation Risk Factors:  he does not have symptoms or diagnosis of sleep apnea. he does not have a history of rheumatic fever.   he has a BMI of Body mass index is 32.33 kg/m.Marland Kitchen Filed Weights   10/20/20 1040  Weight: 96.4 kg    Family History  Problem Relation Age of Onset  . Hypertension  Father 34       Deceased  . Heart disease Father   . Lung cancer Mother 29       Deceased  . Healthy Sister        x3  . Healthy Brother        x4  . Heart disease Brother        #5  . Other Daughter        Alpha Thalassemia  . Lupus Daughter        #2-deceased  . Diabetes Neg Hx   . Heart attack Neg Hx   . Hyperlipidemia Neg Hx   . Sudden death Neg Hx      Atrial Fibrillation Management history:  Previous antiarrhythmic drugs: amiodarone Previous cardioversions: 2018 Previous ablations: none CHADS2VASC score: 5 Anticoagulation history: Xarelto, Eliquis   Past Medical History:  Diagnosis Date  . Atrial flutter ([place])   . Colon polyps   . Constipation 05/27/2015  . DOE (dyspnea on exertion) 10/13/2015  . Faintness 06/20/2016  . History of chicken pox   . Hyperlipemia 02/13/2016  . Hyperlipidemia   . Hypertension   . Hypokalemia 06/28/2016  . Hyponatremia 06/28/2016  . Internal hemorrhoids   . Irregular heartbeat   . Malignant hypertension 07/15/2015  . Sciatica   . Stroke [facility]) 2008  . Syncope and collapse 06/28/2016   Past Surgical History:  Procedure Laterality Date  . CARDIOVERSION N/A 09/02/2017   Procedure: CARDIOVERSION;  Surgeon: [name], MD;  Location: [facility];  Service: Cardiovascular;  Laterality: N/A;  . COLONOSCOPY  2011  . great toe surgery    . PROSTATE BIOPSY      Current Outpatient Medications  Medication Sig Dispense Refill  . acetaminophen (TYLENOL 8 HOUR ARTHRITIS PAIN) 650 MG CR tablet Take 650 mg by mouth every 8 (eight) hours as needed for pain.    Marland Kitchen amLODipine (NORVASC) 10 MG tablet Take 10 mg by mouth daily.    . chlorthalidone (HYGROTON) 25 MG tablet Take 1 tablet by mouth once daily 90 tablet 0  . ELIQUIS 5 MG TABS tablet Take 1 tablet by mouth twice daily 60 tablet 5  . gabapentin (NEURONTIN) 100 MG capsule Take 1 capsule by mouth 4 times daily 360 capsule 0  . metoprolol succinate (TOPROL XL) 25 MG 24 hr tablet Take 0.5  tablets (12.5 mg total) by mouth daily. 30 tablet 2  . oxyCODONE-acetaminophen (PERCOCET/ROXICET) 5-325 MG tablet Take 1 tablet by mouth every 4 (four) hours as needed for severe pain. 12 tablet 0  . potassium chloride SA (KLOR-CON) 20 MEQ tablet TAKE 1 TABLET BY MOUTH IN THE MORNING AND 2 IN THE EVENING 270 tablet 1  . psyllium (REGULOID) 0.52 g capsule Take by mouth.     No current facility-administered medications for this encounter.    Allergies  Allergen Reactions  . Crestor [Rosuvastatin Calcium] Shortness Of Breath    Muscle cramps  . Spironolactone Shortness Of Breath  . Terazosin Shortness Of Breath, Palpitations and Other (See Comments)    Nerve pain  . Amiodarone     Dropped pulse real low to 45-50, and made him feel very cold  . Atenolol Other (See Comments)    Drowsiness and "flu sxs"  . Bystolic [Nebivolol Hcl] Other (See Comments)    GI issues  . Hydralazine Other (See Comments)    Joint swelling  . Tizanidine Other (See Comments)    Syncope, Elevated BP/pulse  . Amlodipine Other (See Comments)  . Clonidine Other (See Comments)  . Hydrocodone-Acetaminophen Other (See Comments)  . Levofloxacin Other (See Comments)  . Losartan Other (See Comments)    Did not work.  Pt states he couldn't take b/c of SE, but can't remember what SE were.  . Maxzide [Hydrochlorothiazide W-Triamterene] Other (See Comments)    Does not tolerate potassium-sparing diuretics  . Olmesartan Other (See Comments)  . Tape Other (See Comments)    Other reaction(s): Unknown  . Tussionex Pennkinetic Er [Hydrocod Polst-Cpm Polst Er] Other (See Comments)    Other reaction(s): Unknown    Social History   Socioeconomic History  . Marital status: Married    Spouse name: Not on file  . Number of children: 1  . Years of education: 45  . Highest education level: Not on file  Occupational History  . Occupation: Retired  Tobacco Use  . Smoking status: Never Smoker  . Smokeless tobacco: Never  Used  Vaping Use  . Vaping Use: Never used  Substance and Sexual Activity  . Alcohol use: No  . Drug use: No  . Sexual activity: Not on file  Other Topics Concern  . Not on file  Social History Narrative   Fun: Play golf and build furniture.    Social Determinants of Health   Financial Resource Strain: Low Risk   . Difficulty of Paying Living Expenses: Not hard at all  Food Insecurity: No Food Insecurity  . Worried About Charity fundraiser in the Last Year: Never true  . Ran Out of  Food in the Last Year: Never true  Transportation Needs: No Transportation Needs  . Lack of Transportation (Medical): No  . Lack of Transportation (Non-Medical): No  Physical Activity: Sufficiently Active  . Days of Exercise per Week: 7 days  . Minutes of Exercise per Session: 30 min  Stress: No Stress Concern Present  . Feeling of Stress : Not at all  Social Connections: Moderately Isolated  . Frequency of Communication with Friends and Family: More than three times a week  . Frequency of Social Gatherings with Friends and Family: Once a week  . Attends Religious Services: Never  . Active Member of Clubs or Organizations: No  . Attends Archivist Meetings: Never  . Marital Status: Married  Human resources officer Violence: Not At Risk  . Fear of Current or Ex-Partner: No  . Emotionally Abused: No  . Physically Abused: No  . Sexually Abused: No     ROS- All systems are reviewed and negative except as per the HPI above.  Physical Exam: Vitals:   10/20/20 1040  BP: 128/76  Pulse: (!) 55  Weight: 96.4 kg  Height: 5\' 8"  (1.727 m)    GEN- The patient is well appearing obese elderly male, alert and oriented x 3 today.   HEENT-head normocephalic, atraumatic, sclera clear, conjunctiva pink, hearing intact, trachea midline. Lungs- Clear to ausculation bilaterally, normal work of breathing Heart- Regular rate and rhythm, bradycardia, no murmurs, rubs or gallops  GI- soft, NT, ND, +  BS Extremities- no clubbing, cyanosis, or edema MS- no significant deformity or atrophy Skin- no rash or lesion Psych- euthymic mood, full affect Neuro- strength and sensation are intact   Wt Readings from Last 3 Encounters:  10/20/20 96.4 kg  10/06/20 97.5 kg  09/25/20 97.5 kg    EKG today demonstrates SB HR 55, PVC, RBBB, LAFB, PR 160, QRS 150, QTc 478  Echo 02/24/18 demonstrated  - Left ventricle: The cavity size was normal. Systolic function was  normal. The estimated ejection fraction was in the range of 55%  to 60%. Wall motion was normal; there were no regional wall  motion abnormalities. There was a reduced contribution of atrial  contraction to ventricular filling, due to increased ventricular  diastolic pressure or atrial contractile dysfunction. The study  is not technically sufficient to allow evaluation of LV diastolic  function. (suspect left atrial mechanical failure, possible  stunning after recent episode of atrial fibrillation versus  chronic atrial dysfunction)  - Left atrium: The atrium was mildly dilated.  - Right ventricle: The cavity size was mildly dilated. Wall  thickness was normal.  - Right atrium: The atrium was mildly dilated.  - Pulmonary arteries: Systolic pressure was moderately increased.  PA peak pressure: 60 mm Hg (S).  Epic records are reviewed at length today  CHA2DS2-VASc Score = 5  The patient's score is based upon: CHF History: 0 HTN History: 1 Diabetes History: 0 Stroke History: 2 Vascular Disease History: 0 Age Score: 2 Gender Score: 0      ASSESSMENT AND PLAN: 1. Persistent Atrial Fibrillation/atrial flutter The patient's CHA2DS2-VASc score is 5, indicating a 7.2% annual risk of stroke.   Zio patch showed 100% AF burden with elevated V rates 112 bpm. He is now back in SR. Continue Toprol 12.5 mg daily Recall he declined consideration for ablation. Continue Eliquis 5 mg BID  2. Secondary  Hypercoagulable State (ICD10:  D68.69) The patient is at significant risk for stroke/thromboembolism based upon his CHA2DS2-VASc Score of  5.  Continue Apixaban (Eliquis).   3. Obesity Body mass index is 32.33 kg/m. Lifestyle modification was discussed and encouraged including regular physical activity and weight reduction.  4. HTN Stable, no changes today.   Follow up with Dr [name] as scheduled.    [facility] [address] [place], [place] [address] [phone] 10/20/2020 10:57 AM

## 2020-10-22 ENCOUNTER — Telehealth: Payer: Self-pay | Admitting: Physician Assistant

## 2020-10-22 NOTE — Telephone Encounter (Signed)
Patient is wanting a referral to have a colonoscopy with [facility] in [facility].  Please advise.

## 2020-10-24 ENCOUNTER — Other Ambulatory Visit: Payer: Self-pay

## 2020-10-24 ENCOUNTER — Encounter: Payer: Medicare Other | Admitting: Physician Assistant

## 2020-10-24 NOTE — Telephone Encounter (Signed)
Patrick Roth states that referral has already been sent. I will contact patient

## 2020-10-24 NOTE — Telephone Encounter (Signed)
LMOVM advising patient that [facility] still has active referral, to contact their office

## 2020-10-27 ENCOUNTER — Encounter: Payer: Self-pay | Admitting: Physician Assistant

## 2020-10-27 ENCOUNTER — Ambulatory Visit (INDEPENDENT_AMBULATORY_CARE_PROVIDER_SITE_OTHER): Payer: Medicare HMO | Admitting: Physician Assistant

## 2020-10-27 ENCOUNTER — Other Ambulatory Visit: Payer: Self-pay

## 2020-10-27 VITALS — BP 122/80 | HR 55 | Temp 98.2°F | Resp 16 | Ht 68.0 in | Wt 211.0 lb

## 2020-10-27 DIAGNOSIS — Z0000 Encounter for general adult medical examination without abnormal findings: Secondary | ICD-10-CM

## 2020-10-27 DIAGNOSIS — E785 Hyperlipidemia, unspecified: Secondary | ICD-10-CM | POA: Diagnosis not present

## 2020-10-27 DIAGNOSIS — I10 Essential (primary) hypertension: Secondary | ICD-10-CM

## 2020-10-27 DIAGNOSIS — Z125 Encounter for screening for malignant neoplasm of prostate: Secondary | ICD-10-CM

## 2020-10-27 DIAGNOSIS — I4819 Other persistent atrial fibrillation: Secondary | ICD-10-CM

## 2020-10-27 DIAGNOSIS — Z1211 Encounter for screening for malignant neoplasm of colon: Secondary | ICD-10-CM

## 2020-10-27 LAB — LIPID PANEL
Cholesterol: 212 mg/dL — ABNORMAL HIGH (ref 0–200)
HDL: 48.4 mg/dL (ref >39.00)
LDL Cholesterol: 145 mg/dL — ABNORMAL HIGH (ref 0–99)
NonHDL: 163.4
Total CHOL/HDL Ratio: 4
Triglycerides: 94 mg/dL (ref 0.0–149.0)
VLDL: 18.8 mg/dL (ref 0.0–40.0)

## 2020-10-27 LAB — COMPREHENSIVE METABOLIC PANEL
ALT: 21 U/L (ref 0–53)
AST: 23 U/L (ref 0–37)
Albumin: 4.2 g/dL (ref 3.5–5.2)
Alkaline Phosphatase: 67 U/L (ref 39–117)
BUN: 16 mg/dL (ref 6–23)
CO2: 29 mEq/L (ref 19–32)
Calcium: 9.5 mg/dL (ref 8.4–10.5)
Chloride: 101 mEq/L (ref 96–112)
Creatinine, Ser: 1.21 mg/dL (ref 0.40–1.50)
GFR: 58.48 mL/min — ABNORMAL LOW (ref >60.00)
Glucose, Bld: 88 mg/dL (ref 70–99)
Potassium: 3.5 mEq/L (ref 3.5–5.1)
Sodium: 138 mEq/L (ref 135–145)
Total Bilirubin: 1.5 mg/dL — ABNORMAL HIGH (ref 0.2–1.2)
Total Protein: 7.2 g/dL (ref 6.0–8.3)

## 2020-10-27 LAB — PSA, MEDICARE: PSA: 7.13 ng/ml — ABNORMAL HIGH (ref 0.10–4.00)

## 2020-10-27 NOTE — Progress Notes (Signed)
Patient presents to clinic today for annual exam.  Patient is fasting for labs.  Acute Concerns: Denies acute concerns at today's visit.   Health Maintenance: Immunizations -- Declines immunizations today.  Colon Cancer Screening -- Coming due. Has appt scheduled.  HIV/Hep C Screening -- up-to-date  Past Medical History:  Diagnosis Date  . Atrial flutter ([address])   . Colon polyps   . Constipation 05/27/2015  . DOE (dyspnea on exertion) 10/13/2015  . Faintness 06/20/2016  . History of chicken pox   . Hyperlipemia 02/13/2016  . Hyperlipidemia   . Hypertension   . Hypokalemia 06/28/2016  . Hyponatremia 06/28/2016  . Internal hemorrhoids   . Irregular heartbeat   . Malignant hypertension 07/15/2015  . Sciatica   . Stroke [facility]) 2008  . Syncope and collapse 06/28/2016    Past Surgical History:  Procedure Laterality Date  . CARDIOVERSION N/A 09/02/2017   Procedure: CARDIOVERSION;  Surgeon: [name], MD;  Location: [facility] ENDOSCOPY;  Service: Cardiovascular;  Laterality: N/A;  . COLONOSCOPY  2011  . great toe surgery    . PROSTATE BIOPSY      Current Outpatient Medications on File Prior to Visit  Medication Sig Dispense Refill  . acetaminophen (TYLENOL 8 HOUR ARTHRITIS PAIN) 650 MG CR tablet Take 650 mg by mouth every 8 (eight) hours as needed for pain.    Marland Kitchen amLODipine (NORVASC) 10 MG tablet Take 10 mg by mouth daily.    . chlorthalidone (HYGROTON) 25 MG tablet Take 1 tablet by mouth once daily 90 tablet 0  . ELIQUIS 5 MG TABS tablet Take 1 tablet by mouth twice daily 60 tablet 5  . gabapentin (NEURONTIN) 100 MG capsule Take 1 capsule by mouth 4 times daily 360 capsule 0  . metoprolol succinate (TOPROL XL) 25 MG 24 hr tablet Take 0.5 tablets (12.5 mg total) by mouth daily. 30 tablet 2  . oxyCODONE-acetaminophen (PERCOCET/ROXICET) 5-325 MG tablet Take 1 tablet by mouth every 4 (four) hours as needed for severe pain. 12 tablet 0  . potassium chloride SA (KLOR-CON) 20 MEQ tablet  TAKE 1 TABLET BY MOUTH IN THE MORNING AND 2 IN THE EVENING 270 tablet 1  . psyllium (REGULOID) 0.52 g capsule Take by mouth.     No current facility-administered medications on file prior to visit.    Allergies  Allergen Reactions  . Crestor [Rosuvastatin Calcium] Shortness Of Breath    Muscle cramps  . Spironolactone Shortness Of Breath  . Terazosin Shortness Of Breath, Palpitations and Other (See Comments)    Nerve pain  . Amiodarone     Dropped pulse real low to 45-50, and made him feel very cold  . Atenolol Other (See Comments)    Drowsiness and "flu sxs"  . Bystolic [Nebivolol Hcl] Other (See Comments)    GI issues  . Hydralazine Other (See Comments)    Joint swelling  . Tizanidine Other (See Comments)    Syncope, Elevated BP/pulse  . Amlodipine Other (See Comments)  . Clonidine Other (See Comments)  . Hydrocodone-Acetaminophen Other (See Comments)  . Levofloxacin Other (See Comments)  . Losartan Other (See Comments)    Did not work.  Pt states he couldn't take b/c of SE, but can't remember what SE were.  . Maxzide [Hydrochlorothiazide W-Triamterene] Other (See Comments)    Does not tolerate potassium-sparing diuretics  . Olmesartan Other (See Comments)  . Tape Other (See Comments)    Other reaction(s): Unknown  . Tussionex Pennkinetic Er [employer]  Polst-Cpm Polst Er] Other (See Comments)    Other reaction(s): Unknown    Family History  Problem Relation Age of Onset  . Hypertension Father 78       Deceased  . Heart disease Father   . Lung cancer Mother 64       Deceased  . Healthy Sister        x3  . Healthy Brother        x4  . Heart disease Brother        #5  . Other Daughter        Alpha Thalassemia  . Lupus Daughter        #2-deceased  . Diabetes Neg Hx   . Heart attack Neg Hx   . Hyperlipidemia Neg Hx   . Sudden death Neg Hx     Social History   Socioeconomic History  . Marital status: Married    Spouse name: Not on file  . Number of  children: 1  . Years of education: 38  . Highest education level: Not on file  Occupational History  . Occupation: Retired  Tobacco Use  . Smoking status: Never Smoker  . Smokeless tobacco: Never Used  Vaping Use  . Vaping Use: Never used  Substance and Sexual Activity  . Alcohol use: No  . Drug use: No  . Sexual activity: Not on file  Other Topics Concern  . Not on file  Social History Narrative   Fun: Play golf and build furniture.    Social Determinants of Health   Financial Resource Strain: Low Risk   . Difficulty of Paying Living Expenses: Not hard at all  Food Insecurity: No Food Insecurity  . Worried About Charity fundraiser in the Last Year: Never true  . Ran Out of Food in the Last Year: Never true  Transportation Needs: No Transportation Needs  . Lack of Transportation (Medical): No  . Lack of Transportation (Non-Medical): No  Physical Activity: Sufficiently Active  . Days of Exercise per Week: 7 days  . Minutes of Exercise per Session: 30 min  Stress: No Stress Concern Present  . Feeling of Stress : Not at all  Social Connections: Moderately Isolated  . Frequency of Communication with Friends and Family: More than three times a week  . Frequency of Social Gatherings with Friends and Family: Once a week  . Attends Religious Services: Never  . Active Member of Clubs or Organizations: No  . Attends Archivist Meetings: Never  . Marital Status: Married  Human resources officer Violence: Not At Risk  . Fear of Current or Ex-Partner: No  . Emotionally Abused: No  . Physically Abused: No  . Sexually Abused: No   Review of Systems  Constitutional: Negative for fever and weight loss.  HENT: Negative for ear discharge, ear pain, hearing loss and tinnitus.   Eyes: Negative for blurred vision, double vision, photophobia and pain.  Respiratory: Negative for cough and shortness of breath.   Cardiovascular: Negative for chest pain and palpitations.   Gastrointestinal: Negative for abdominal pain, blood in stool, constipation, diarrhea, heartburn, melena, nausea and vomiting.  Genitourinary: Negative for dysuria, flank pain, frequency, hematuria and urgency.  Musculoskeletal: Negative for falls.  Neurological: Negative for dizziness, loss of consciousness and headaches.  Endo/Heme/Allergies: Negative for environmental allergies.  Psychiatric/Behavioral: Negative for depression, hallucinations, substance abuse and suicidal ideas. The patient is not nervous/anxious and does not have insomnia.     Temp 98.2 F (36.8 C) (  Temporal)   Resp 16   Ht 5' 8"  (1.727 m)   Wt 211 lb (95.7 kg)   BMI 32.08 kg/m   Physical Exam Vitals reviewed.  Constitutional:      General: He is not in acute distress.    Appearance: He is well-developed. He is not diaphoretic.  HENT:     Head: Normocephalic and atraumatic.     Right Ear: Tympanic membrane, ear canal and external ear normal.     Left Ear: Tympanic membrane, ear canal and external ear normal.     Nose: Nose normal.     Mouth/Throat:     Pharynx: No posterior oropharyngeal erythema.  Eyes:     Conjunctiva/sclera: Conjunctivae normal.     Pupils: Pupils are equal, round, and reactive to light.  Neck:     Thyroid: No thyromegaly.  Cardiovascular:     Rate and Rhythm: Rhythm irregular.     Heart sounds: Normal heart sounds.  Pulmonary:     Effort: Pulmonary effort is normal. No respiratory distress.     Breath sounds: Normal breath sounds. No wheezing or rales.  Chest:     Chest wall: No tenderness.  Abdominal:     General: Bowel sounds are normal. There is no distension.     Palpations: Abdomen is soft. There is no mass.     Tenderness: There is no abdominal tenderness. There is no guarding or rebound.  Musculoskeletal:     Cervical back: Neck supple.  Lymphadenopathy:     Cervical: No cervical adenopathy.  Skin:    General: Skin is warm and dry.     Findings: No rash.   Neurological:     Mental Status: He is alert and oriented to person, place, and time.     Cranial Nerves: No cranial nerve deficit.    Recent Results (from the past 2160 hour(s))  Comp Met (CMET)     Status: Abnormal   Collection Time: 08/21/20 11:03 AM  Result Value Ref Range   Glucose 101 (H) 65 - 99 mg/dL   BUN 15 8 - 27 mg/dL   Creatinine, Ser 1.31 (H) 0.76 - 1.27 mg/dL   GFR calc non Af Amer 53 (L) >59 mL/min/1.73   GFR calc Af Amer 61 >59 mL/min/1.73    Comment: **[facility] currently reports eGFR in compliance with the current**   recommendations of the [employer]. [facility] will   update reporting as new guidelines are published from the NKF-ASN   Task force.    BUN/Creatinine Ratio 11 10 - 24   Sodium 141 134 - 144 mmol/L   Potassium 3.5 3.5 - 5.2 mmol/L   Chloride 101 96 - 106 mmol/L   CO2 27 20 - 29 mmol/L   Calcium 9.4 8.6 - 10.2 mg/dL   Total Protein 7.3 6.0 - 8.5 g/dL   Albumin 4.4 3.7 - 4.7 g/dL   Globulin, Total 2.9 1.5 - 4.5 g/dL   Albumin/Globulin Ratio 1.5 1.2 - 2.2   Bilirubin Total 1.2 0.0 - 1.2 mg/dL   Alkaline Phosphatase 83 44 - 121 IU/L    Comment:               **Please note reference interval change**   AST 23 0 - 40 IU/L   ALT 18 0 - 44 IU/L  TSH     Status: None   Collection Time: 08/21/20 11:03 AM  Result Value Ref Range   TSH 1.640 0.450 - 4.500 uIU/mL  CBC  w/Diff     Status: None   Collection Time: 08/21/20 11:03 AM  Result Value Ref Range   WBC 4.7 3.4 - 10.8 x10E3/uL   RBC 5.76 4.14 - 5.80 x10E6/uL   Hemoglobin 16.2 13.0 - 17.7 g/dL   Hematocrit 49.9 37.5 - 51.0 %   MCV 87 79 - 97 fL   MCH 28.1 26.6 - 33.0 pg   MCHC 32.5 31 - 35 g/dL   RDW 14.8 11.6 - 15.4 %   Platelets 161 150 - 450 x10E3/uL   Neutrophils 58 Not Estab. %   Lymphs 29 Not Estab. %   Monocytes 12 Not Estab. %   Eos 1 Not Estab. %   Basos 0 Not Estab. %   Neutrophils Absolute 2.7 1.40 - 7.00 x10E3/uL   Lymphocytes Absolute 1.3 0 - 3 x10E3/uL    Monocytes Absolute 0.6 0 - 0 x10E3/uL   EOS (ABSOLUTE) 0.1 0.0 - 0.4 x10E3/uL   Basophils Absolute 0.0 0 - 0 x10E3/uL   Immature Granulocytes 0 Not Estab. %   Immature Grans (Abs) 0.0 0.0 - 0.1 x10E3/uL    Assessment/Plan: 1. Visit for preventive health examination Depression screen negative. Health Maintenance reviewed. Preventive schedule discussed and handout given in AVS. Will obtain fasting labs today. - Comprehensive metabolic panel - Lipid panel  2. Colon cancer screening Due for repeat colonoscopy. Has already been contacted by his GI provider at [facility] and is scheduled. Needs referral for insurance purposes.  Referral placed. - Ambulatory referral to Gastroenterology  3. Prostate cancer screening  He indicates understanding of the limitations of this screening test and wishes to proceed with screening PSA testing.  - PSA, Medicare  4. Persistent atrial fibrillation (HCC) Rate controlled.  Asymptomatic.  Continue management per specialist.  5. Essential hypertension BP normotensive.  Asymptomatic.  Continue management per specialist.  Repeat CMP and lipids today.  6. Hyperlipidemia, unspecified hyperlipidemia type Repeat lipid fasting lipid panel today.  Dietary and exercise recommendations reviewed with patient.   This visit occurred during the SARS-CoV-2 public health emergency.  Safety protocols were in place, including screening questions prior to the visit, additional usage of staff PPE, and extensive cleaning of exam room while observing appropriate contact time as indicated for disinfecting solutions.    Leeanne Rio, PA-C

## 2020-10-27 NOTE — Patient Instructions (Signed)
Please go to the lab for blood work.   Our office will call you with your results unless you have chosen to receive results via MyChart.  If your blood work is normal we will follow-up each year for physicals and as scheduled for chronic medical problems.  If anything is abnormal we will treat accordingly and get you in for a follow-up.  Proceed with your repeat colonoscopy.    Preventive Care 52 Years and Older, Male Preventive care refers to lifestyle choices and visits with your health care provider that can promote health and wellness. This includes:  A yearly physical exam. This is also called an annual well check.  Regular dental and eye exams.  Immunizations.  Screening for certain conditions.  Healthy lifestyle choices, such as diet and exercise. What can I expect for my preventive care visit? Physical exam Your health care provider will check:  Height and weight. These may be used to calculate body mass index (BMI), which is a measurement that tells if you are at a healthy weight.  Heart rate and blood pressure.  Your skin for abnormal spots. Counseling Your health care provider may ask you questions about:  Alcohol, tobacco, and drug use.  Emotional well-being.  Home and relationship well-being.  Sexual activity.  Eating habits.  History of falls.  Memory and ability to understand (cognition).  Work and work Statistician. What immunizations do I need?  Influenza (flu) vaccine  This is recommended every year. Tetanus, diphtheria, and pertussis (Tdap) vaccine  You may need a Td booster every 10 years. Varicella (chickenpox) vaccine  You may need this vaccine if you have not already been vaccinated. Zoster (shingles) vaccine  You may need this after age 67. Pneumococcal conjugate (PCV13) vaccine  One dose is recommended after age 79. Pneumococcal polysaccharide (PPSV23) vaccine  One dose is recommended after age 78. Measles, mumps, and  rubella (MMR) vaccine  You may need at least one dose of MMR if you were born in 1957 or later. You may also need a second dose. Meningococcal conjugate (MenACWY) vaccine  You may need this if you have certain conditions. Hepatitis A vaccine  You may need this if you have certain conditions or if you travel or work in places where you may be exposed to hepatitis A. Hepatitis B vaccine  You may need this if you have certain conditions or if you travel or work in places where you may be exposed to hepatitis B. Haemophilus influenzae type b (Hib) vaccine  You may need this if you have certain conditions. You may receive vaccines as individual doses or as more than one vaccine together in one shot (combination vaccines). Talk with your health care provider about the risks and benefits of combination vaccines. What tests do I need? Blood tests  Lipid and cholesterol levels. These may be checked every 5 years, or more frequently depending on your overall health.  Hepatitis C test.  Hepatitis B test. Screening  Lung cancer screening. You may have this screening every year starting at age 53 if you have a 30-pack-year history of smoking and currently smoke or have quit within the past 15 years.  Colorectal cancer screening. All adults should have this screening starting at age 54 and continuing until age 66. Your health care provider may recommend screening at age 67 if you are at increased risk. You will have tests every 1-10 years, depending on your results and the type of screening test.  Prostate cancer screening. Recommendations  will vary depending on your family history and other risks.  Diabetes screening. This is done by checking your blood sugar (glucose) after you have not eaten for a while (fasting). You may have this done every 1-3 years.  Abdominal aortic aneurysm (AAA) screening. You may need this if you are a current or former smoker.  Sexually transmitted disease (STD)  testing. Follow these instructions at home: Eating and drinking  Eat a diet that includes fresh fruits and vegetables, whole grains, lean protein, and low-fat dairy products. Limit your intake of foods with high amounts of sugar, saturated fats, and salt.  Take vitamin and mineral supplements as recommended by your health care provider.  Do not drink alcohol if your health care provider tells you not to drink.  If you drink alcohol: ? Limit how much you have to 0-2 drinks a day. ? Be aware of how much alcohol is in your drink. In the [place] one drink equals one 12 oz bottle of beer (355 mL), one 5 oz glass of wine (148 mL), or one 1 oz glass of hard liquor (44 mL). Lifestyle  Take daily care of your teeth and gums.  Stay active. Exercise for at least 30 minutes on 5 or more days each week.  Do not use any products that contain nicotine or tobacco, such as cigarettes, e-cigarettes, and chewing tobacco. If you need help quitting, ask your health care provider.  If you are sexually active, practice safe sex. Use a condom or other form of protection to prevent STIs (sexually transmitted infections).  Talk with your health care provider about taking a low-dose aspirin or statin. What's next?  Visit your health care provider once a year for a well check visit.  Ask your health care provider how often you should have your eyes and teeth checked.  Stay up to date on all vaccines. This information is not intended to replace advice given to you by your health care provider. Make sure you discuss any questions you have with your health care provider. Document Revised: 11/02/2018 Document Reviewed: 11/02/2018 Elsevier Patient Education  [employer]. .

## 2020-11-11 ENCOUNTER — Other Ambulatory Visit: Payer: Self-pay | Admitting: Hematology and Oncology

## 2020-11-11 DIAGNOSIS — D709 Neutropenia, unspecified: Secondary | ICD-10-CM

## 2020-11-11 NOTE — Progress Notes (Signed)
No show

## 2020-11-12 ENCOUNTER — Inpatient Hospital Stay: Payer: Medicare HMO

## 2020-11-12 ENCOUNTER — Inpatient Hospital Stay: Payer: Medicare HMO | Attending: Hematology and Oncology | Admitting: Hematology and Oncology

## 2020-12-01 DIAGNOSIS — Z01818 Encounter for other preprocedural examination: Secondary | ICD-10-CM | POA: Diagnosis not present

## 2020-12-01 DIAGNOSIS — K635 Polyp of colon: Secondary | ICD-10-CM | POA: Diagnosis not present

## 2020-12-01 DIAGNOSIS — Z86010 Personal history of colonic polyps: Secondary | ICD-10-CM | POA: Diagnosis not present

## 2020-12-01 DIAGNOSIS — Z1211 Encounter for screening for malignant neoplasm of colon: Secondary | ICD-10-CM | POA: Diagnosis not present

## 2020-12-04 DIAGNOSIS — K635 Polyp of colon: Secondary | ICD-10-CM | POA: Diagnosis not present

## 2020-12-18 ENCOUNTER — Telehealth: Payer: Self-pay

## 2020-12-18 NOTE — Chronic Care Management (AMB) (Signed)
Chronic Care Management Pharmacy Assistant   Name: Patrick Roth  MRN: [id number] DOB: August 03, 1946  Reason for Encounter: Disease State/ Hypertension Adherence Call   PCP : Brunetta Jeans, PA-C  Allergies:   Allergies  Allergen Reactions  . Crestor [Rosuvastatin Calcium] Shortness Of Breath    Muscle cramps  . Spironolactone Shortness Of Breath  . Terazosin Shortness Of Breath, Palpitations and Other (See Comments)    Nerve pain  . Amiodarone     Dropped pulse real low to 45-50, and made him feel very cold  . Atenolol Other (See Comments)    Drowsiness and "flu sxs"  . Bystolic [Nebivolol Hcl] Other (See Comments)    GI issues  . Hydralazine Other (See Comments)    Joint swelling  . Tizanidine Other (See Comments)    Syncope, Elevated BP/pulse  . Amlodipine Other (See Comments)  . Clonidine Other (See Comments)  . Hydrocodone-Acetaminophen Other (See Comments)  . Levofloxacin Other (See Comments)  . Losartan Other (See Comments)    Did not work.  Pt states he couldn't take b/c of SE, but can't remember what SE were.  . Maxzide [Hydrochlorothiazide W-Triamterene] Other (See Comments)    Does not tolerate potassium-sparing diuretics  . Olmesartan Other (See Comments)  . Tape Other (See Comments)    Other reaction(s): Unknown  . Tussionex Pennkinetic Er [Hydrocod Polst-Cpm Polst Er] Other (See Comments)    Other reaction(s): Unknown    Medications: Outpatient Encounter Medications as of 12/18/2020  Medication Sig  . acetaminophen (TYLENOL 8 HOUR ARTHRITIS PAIN) 650 MG CR tablet Take 650 mg by mouth every 8 (eight) hours as needed for pain.  Marland Kitchen amLODipine (NORVASC) 10 MG tablet Take 10 mg by mouth daily.  . chlorthalidone (HYGROTON) 25 MG tablet Take 1 tablet by mouth once daily  . ELIQUIS 5 MG TABS tablet Take 1 tablet by mouth twice daily  . gabapentin (NEURONTIN) 100 MG capsule Take 1 capsule by mouth 4 times daily  . metoprolol succinate (TOPROL XL) 25 MG 24 hr  tablet Take 0.5 tablets (12.5 mg total) by mouth daily.  Marland Kitchen oxyCODONE-acetaminophen (PERCOCET/ROXICET) 5-325 MG tablet Take 1 tablet by mouth every 4 (four) hours as needed for severe pain.  . potassium chloride SA (KLOR-CON) 20 MEQ tablet TAKE 1 TABLET BY MOUTH IN THE MORNING AND 2 IN THE EVENING  . psyllium (REGULOID) 0.52 g capsule Take by mouth.   No facility-administered encounter medications on file as of 12/18/2020.    Current Diagnosis: Patient Active Problem List   Diagnosis Date Noted  . Secondary hypercoagulable state ([place]) 09/11/2020  . Cyst of perineum in male 11/28/2018  . Hyperlipidemia   . History of chicken pox   . Colon polyps   . Atypical atrial flutter ([place])   . Other intervertebral disc degeneration, lumbar region 05/09/2017  . Lumbar radiculopathy, chronic 03/29/2017  . Persistent atrial fibrillation ([place]) 09/14/2016  . Essential hypertension 05/27/2015  . Mallet deformity of fourth finger, right 07/11/2013  . Stroke ([place]) 08/23/2012    Reviewed chart prior to disease state call. Spoke with patient regarding BP  Recent Office Vitals: BP Readings from Last 3 Encounters:  10/27/20 122/80  10/20/20 128/76  09/25/20 128/90   Pulse Readings from Last 3 Encounters:  10/27/20 (!) 55  10/20/20 (!) 55  09/25/20 (!) 102    Wt Readings from Last 3 Encounters:  10/27/20 211 lb (95.7 kg)  10/20/20 212 lb 9.6 oz (96.4 kg)  10/06/20  215 lb (97.5 kg)     Kidney Function Lab Results  Component Value Date/Time   CREATININE 1.21 10/27/2020 01:24 PM   CREATININE 1.31 (H) 08/21/2020 11:03 AM   CREATININE 1.27 (H) 05/15/2020 09:54 AM   CREATININE 1.37 (H) 02/13/2020 09:24 AM   CREATININE 1.20 (H) 10/25/2016 11:40 AM   CREATININE 1.36 (H) 09/14/2016 10:19 AM   GFR 58.48 (L) 10/27/2020 01:24 PM   GFRNONAA 53 (L) 08/21/2020 11:03 AM   GFRNONAA 55 (L) 05/15/2020 09:54 AM   GFRAA 61 08/21/2020 11:03 AM   GFRAA >60 05/15/2020 09:54 AM    BMP Latest Ref Rng & Units  10/27/2020 08/21/2020 07/21/2020  Glucose 70 - 99 mg/dL 88 101(H) 90  BUN 6 - 23 mg/dL 16 15 18   Creatinine 0.40 - 1.50 mg/dL 1.21 1.31(H) 1.49  BUN/Creat Ratio 10 - 24 - 11 -  Sodium 135 - 145 mEq/L 138 141 139  Potassium 3.5 - 5.1 mEq/L 3.5 3.5 3.2(L)  Chloride 96 - 112 mEq/L 101 101 98  CO2 19 - 32 mEq/L 29 27 31   Calcium 8.4 - 10.5 mg/dL 9.5 9.4 9.5    . Current antihypertensive regimen:  o Amlodipine 10 mg tablet daily o Metoprolol succinate XL 25 mg tablet, 1/2 tablet twice daily o Chlorthalidone 25 mg tablet once daily  . How often are you checking your Blood Pressure? 1-2x per week   . Current home BP readings: 135, 75 HR-40-50's  . What recent interventions/DTPs have been made by any provider to improve Blood Pressure control since last CPP Visit: Patient states he is trying to improve his diet and exercise. Patient states he is taking his medications as instructed.  . Any recent hospitalizations or ED visits since last visit with CPP? No , patient has not had any recent hospitalizations or ED visits since his last visit with [name], Pharm.D., BCGP.  Marland Kitchen What diet changes have been made to improve Blood Pressure Control?  o Patient states he tries his best to avoid salt and any high sodium foods.  . What exercise is being done to improve your Blood Pressure Control?  o Patient states he is active more than 30 minutes a day, 5 times a week.  Adherence Review: Is the patient currently on ACE/ARB medication? No Does the patient have >5 day gap between last estimated fill dates? No  Patient states he has not received his patient assistance application for medication Eliquis.  -Informed patient will mail patient assistance application to him next week for Eliquis.  April D Calhoun, [place] Pharmacist Assistant [phone]   Follow-Up:  Pharmacist Review

## 2020-12-24 DIAGNOSIS — M25512 Pain in left shoulder: Secondary | ICD-10-CM | POA: Diagnosis not present

## 2020-12-24 DIAGNOSIS — M19012 Primary osteoarthritis, left shoulder: Secondary | ICD-10-CM | POA: Diagnosis not present

## 2020-12-24 DIAGNOSIS — M25712 Osteophyte, left shoulder: Secondary | ICD-10-CM | POA: Diagnosis not present

## 2020-12-25 ENCOUNTER — Ambulatory Visit: Payer: Medicare HMO | Admitting: Cardiology

## 2020-12-25 NOTE — Progress Notes (Deleted)
Electrophysiology Office Note   Date:  12/25/2020   ID:  Patrick Roth, Patrick Roth Jun 30, 1946, MRN [id number]  PCP:  Brunetta Jeans, PA-C  Cardiologist:  [name] Primary Electrophysiologist:  Makena Murdock Meredith Leeds, MD    No chief complaint on file.    History of Present Illness: Patrick Roth is a 75 y.o. male who presents today for electrophysiology evaluation.     He has a history of hypertension, multiple drug intolerances, hyperlipidemia, prior CVA.  He has previously admitted to the hospital syncope and was found to have orthostatic hypotension.  He is also found a new onset atrial fibrillation with rapid rates.  He is asymptomatic.  He was started on metoprolol and Xarelto.  He was then switched to amiodarone.  Today, denies symptoms of palpitations, chest pain, shortness of breath, orthopnea, PND, lower extremity edema, claudication, dizziness, presyncope, syncope, bleeding, or neurologic sequela. The patient is tolerating medications without difficulties. ***   Past Medical History:  Diagnosis Date  . Atrial flutter ([place])   . Colon polyps   . Constipation 05/27/2015  . DOE (dyspnea on exertion) 10/13/2015  . Faintness 06/20/2016  . History of chicken pox   . Hyperlipemia 02/13/2016  . Hyperlipidemia   . Hypertension   . Hypokalemia 06/28/2016  . Hyponatremia 06/28/2016  . Internal hemorrhoids   . Irregular heartbeat   . Malignant hypertension 07/15/2015  . Sciatica   . Stroke [facility]) 2008  . Syncope and collapse 06/28/2016   Past Surgical History:  Procedure Laterality Date  . CARDIOVERSION N/A 09/02/2017   Procedure: CARDIOVERSION;  Surgeon: [name], MD;  Location: [facility] ENDOSCOPY;  Service: Cardiovascular;  Laterality: N/A;  . COLONOSCOPY  2011  . great toe surgery    . PROSTATE BIOPSY       Current Outpatient Medications  Medication Sig Dispense Refill  . acetaminophen (TYLENOL 8 HOUR ARTHRITIS PAIN) 650 MG CR tablet Take 650 mg by mouth every 8 (eight) hours as  needed for pain.    Marland Kitchen amLODipine (NORVASC) 10 MG tablet Take 10 mg by mouth daily.    . chlorthalidone (HYGROTON) 25 MG tablet Take 1 tablet by mouth once daily 90 tablet 0  . ELIQUIS 5 MG TABS tablet Take 1 tablet by mouth twice daily 60 tablet 5  . gabapentin (NEURONTIN) 100 MG capsule Take 1 capsule by mouth 4 times daily 360 capsule 0  . metoprolol succinate (TOPROL XL) 25 MG 24 hr tablet Take 0.5 tablets (12.5 mg total) by mouth daily. 30 tablet 2  . oxyCODONE-acetaminophen (PERCOCET/ROXICET) 5-325 MG tablet Take 1 tablet by mouth every 4 (four) hours as needed for severe pain. 12 tablet 0  . potassium chloride SA (KLOR-CON) 20 MEQ tablet TAKE 1 TABLET BY MOUTH IN THE MORNING AND 2 IN THE EVENING 270 tablet 1  . psyllium (REGULOID) 0.52 g capsule Take by mouth.     No current facility-administered medications for this visit.    Allergies:   Crestor [rosuvastatin calcium], Spironolactone, Terazosin, Amiodarone, Atenolol, Bystolic [nebivolol hcl], Hydralazine, Tizanidine, Amlodipine, Clonidine, Hydrocodone-acetaminophen, Levofloxacin, Losartan, Maxzide [hydrochlorothiazide w-triamterene], Olmesartan, Tape, and Tussionex pennkinetic er [hydrocod polst-cpm polst er]   Social History:  The patient  reports that he has never smoked. He has never used smokeless tobacco. He reports that he does not drink alcohol and does not use drugs.   Family History:  The patient's family history includes Healthy in his brother and sister; Heart disease in his brother and father; Hypertension (age  of onset: 35) in his father; Lung cancer (age of onset: 62) in his mother; Lupus in his daughter; Other in his daughter.   ROS:  Please see the history of present illness.   Otherwise, review of systems is positive for none.   All other systems are reviewed and negative.   PHYSICAL EXAM: VS:  There were no vitals taken for this visit. , BMI There is no height or weight on file to calculate BMI. GEN: Well nourished,  well developed, in no acute distress  HEENT: normal  Neck: no JVD, carotid bruits, or masses Cardiac: ***RRR; no murmurs, rubs, or gallops,no edema  Respiratory:  clear to auscultation bilaterally, normal work of breathing GI: soft, nontender, nondistended, + BS MS: no deformity or atrophy  Skin: warm and dry Neuro:  Strength and sensation are intact Psych: euthymic mood, full affect  EKG:  EKG {ACTION; IS/IS [id number]} ordered today. Personal review of the ekg ordered *** shows ***   Recent Labs: 08/21/2020: Hemoglobin 16.2; Platelets 161; TSH 1.640 10/27/2020: ALT 21; BUN 16; Creatinine, Ser 1.21; Potassium 3.5; Sodium 138    Lipid Panel     Component Value Date/Time   CHOL 212 (H) 10/27/2020 1324   CHOL 263 (H) 04/20/2019 0919   TRIG 94.0 10/27/2020 1324   HDL 48.40 10/27/2020 1324   HDL 45 04/20/2019 0919   CHOLHDL 4 10/27/2020 1324   VLDL 18.8 10/27/2020 1324   LDLCALC 145 (H) 10/27/2020 1324   LDLCALC 197 (H) 04/20/2019 0919     Wt Readings from Last 3 Encounters:  10/27/20 211 lb (95.7 kg)  10/20/20 212 lb 9.6 oz (96.4 kg)  10/06/20 215 lb (97.5 kg)      Other studies Reviewed: Additional studies/ records that were reviewed today include: TTE 02/28/18 Review of the above records today demonstrates:  - Left ventricle: The cavity size was normal. Systolic function was   normal. The estimated ejection fraction was in the range of 55%   to 60%. Wall motion was normal; there were no regional wall   motion abnormalities. There was a reduced contribution of atrial   contraction to ventricular filling, due to increased ventricular   diastolic pressure or atrial contractile dysfunction. The study   is not technically sufficient to allow evaluation of LV diastolic   function. (suspect left atrial mechanical failure, possible   stunning after recent episode of atrial fibrillation versus   chronic atrial dysfunction) - Left atrium: The atrium was mildly dilated. -  Right ventricle: The cavity size was mildly dilated. Wall   thickness was normal. - Right atrium: The atrium was mildly dilated. - Pulmonary arteries: Systolic pressure was moderately increased.   PA peak pressure: 60 mm Hg (S).  Myoview 02/24/18  Nuclear stress EF: 58%.  Normal perfusion. No ischemia  This is a low risk study.  Cardiac monitor 09/29/2020 personally reviewed Max 215 bpm 02:28pm, 10/28 Min 82 bpm 11:16am, 10/28 Avg 112 bpm 3.3% PVC burden, rare supraventricular ectopy Atrial fibrillation/flutter occurred 100% of the time Longest ventricular run 10 seconds at 161 bpm No symptoms recorded  ASSESSMENT AND PLAN:  1.  Persistent atrial fibrillation/flutter: Currently on amiodarone and Eliquis.  CHA2DS2-VASc of 4.  High risk medication monitoring.  ***   2.  Hypertension:***  Current medicines are reviewed at length with the patient today.   The patient does not have concerns regarding his medicines.  The following changes were made today: ***  Labs/ tests ordered today include:  No  orders of the defined types were placed in this encounter.    Disposition:   FU with Lala Been *** months  Signed, Shyvonne Chastang Meredith Leeds, MD  12/25/2020 9:45 AM     [facility] [address] [place] [address] [phone] (office) [phone] (fax)

## 2020-12-30 DIAGNOSIS — K648 Other hemorrhoids: Secondary | ICD-10-CM | POA: Diagnosis not present

## 2020-12-30 DIAGNOSIS — K5730 Diverticulosis of large intestine without perforation or abscess without bleeding: Secondary | ICD-10-CM | POA: Diagnosis not present

## 2020-12-30 DIAGNOSIS — D126 Benign neoplasm of colon, unspecified: Secondary | ICD-10-CM | POA: Diagnosis not present

## 2020-12-31 ENCOUNTER — Other Ambulatory Visit: Payer: Self-pay | Admitting: Physician Assistant

## 2021-01-06 ENCOUNTER — Telehealth: Payer: Self-pay | Admitting: Cardiology

## 2021-01-06 NOTE — Progress Notes (Signed)
PCP:  Brunetta Jeans, PA-C Primary Cardiologist: Mertie Moores, MD Electrophysiologist: [name], MD   Patrick Roth is a 75 y.o. male seen today for [name], MD for acute visit due to BP concerns.  Since last being seen in our clinic the patient reports doing OK. He has noted increased HR since he recently had a colonoscopy, and a shoulder injection. Despite this, he denies chest pain, palpitations, dyspnea, PND, orthopnea, nausea, vomiting, dizziness, syncope, edema, weight gain, or early satiety.  Past Medical History:  Diagnosis Date   Atrial flutter ([place])    Colon polyps    Constipation 05/27/2015   DOE (dyspnea on exertion) 10/13/2015   Faintness 06/20/2016   History of chicken pox    Hyperlipemia 02/13/2016   Hyperlipidemia    Hypertension    Hypokalemia 06/28/2016   Hyponatremia 06/28/2016   Internal hemorrhoids    Irregular heartbeat    Malignant hypertension 07/15/2015   Sciatica    Stroke ([place]) 2008   Syncope and collapse 06/28/2016   Past Surgical History:  Procedure Laterality Date   CARDIOVERSION N/A 09/02/2017   Procedure: CARDIOVERSION;  Surgeon: [name], MD;  Location: [facility] ENDOSCOPY;  Service: Cardiovascular;  Laterality: N/A;   COLONOSCOPY  2011   great toe surgery     PROSTATE BIOPSY      Current Outpatient Medications  Medication Sig Dispense Refill   acetaminophen (TYLENOL 8 HOUR ARTHRITIS PAIN) 650 MG CR tablet Take 650 mg by mouth every 8 (eight) hours as needed for pain.     amLODipine (NORVASC) 10 MG tablet Take 10 mg by mouth daily.     chlorthalidone (HYGROTON) 25 MG tablet Take 1 tablet by mouth once daily 90 tablet 0   ELIQUIS 5 MG TABS tablet Take 1 tablet by mouth twice daily 60 tablet 5   gabapentin (NEURONTIN) 100 MG capsule Take 1 capsule by mouth 4 times daily 360 capsule 0   metoprolol succinate (TOPROL XL) 25 MG 24 hr tablet Take 0.5 tablets (12.5 mg total) by mouth daily. 30 tablet 2    oxyCODONE-acetaminophen (PERCOCET/ROXICET) 5-325 MG tablet Take 1 tablet by mouth every 4 (four) hours as needed for severe pain. 12 tablet 0   potassium chloride SA (KLOR-CON) 20 MEQ tablet TAKE 1 TABLET BY MOUTH IN THE MORNING AND 2 IN THE EVENING 270 tablet 1   psyllium (REGULOID) 0.52 g capsule Take by mouth.     No current facility-administered medications for this visit.    Allergies  Allergen Reactions   Crestor [Rosuvastatin Calcium] Shortness Of Breath    Muscle cramps   Spironolactone Shortness Of Breath   Terazosin Shortness Of Breath, Palpitations and Other (See Comments)    Nerve pain   Amiodarone     Dropped pulse real low to 45-50, and made him feel very cold   Atenolol Other (See Comments)    Drowsiness and "flu sxs"   Bystolic [Nebivolol Hcl] Other (See Comments)    GI issues   Hydralazine Other (See Comments)    Joint swelling   Tizanidine Other (See Comments)    Syncope, Elevated BP/pulse   Amlodipine Other (See Comments)   Clonidine Other (See Comments)   Hydrocodone-Acetaminophen Other (See Comments)   Levofloxacin Other (See Comments)   Losartan Other (See Comments)    Did not work.  Pt states he couldn't take b/c of SE, but can't remember what SE were.   Maxzide [Hydrochlorothiazide W-Triamterene] Other (See Comments)  Does not tolerate potassium-sparing diuretics   Olmesartan Other (See Comments)   Tape Other (See Comments)    Other reaction(s): Unknown   Tussionex Pennkinetic Er [Hydrocod Polst-Cpm Polst Er] Other (See Comments)    Other reaction(s): Unknown    Social History   Socioeconomic History   Marital status: Married    Spouse name: Not on file   Number of children: 1   Years of education: 12   Highest education level: Not on file  Occupational History   Occupation: Retired  Tobacco Use   Smoking status: Never Smoker   Smokeless tobacco: Never Used  Scientific laboratory technician Use: Never used  Substance  and Sexual Activity   Alcohol use: No   Drug use: No   Sexual activity: Not on file  Other Topics Concern   Not on file  Social History Narrative   Fun: Play golf and build furniture.    Social Determinants of Health   Financial Resource Strain: Low Risk    Difficulty of Paying Living Expenses: Not hard at all  Food Insecurity: No Food Insecurity   Worried About Charity fundraiser in the Last Year: Never true   [place] in the Last Year: Never true  Transportation Needs: No Transportation Needs   Lack of Transportation (Medical): No   Lack of Transportation (Non-Medical): No  Physical Activity: Sufficiently Active   Days of Exercise per Week: 7 days   Minutes of Exercise per Session: 30 min  Stress: No Stress Concern Present   Feeling of Stress : Not at all  Social Connections: Moderately Isolated   Frequency of Communication with Friends and Family: More than three times a week   Frequency of Social Gatherings with Friends and Family: Once a week   Attends Religious Services: Never   Marine scientist or Organizations: No   Attends Music therapist: Never   Marital Status: Married  Human resources officer Violence: Not At Risk   Fear of Current or Ex-Partner: No   Emotionally Abused: No   Physically Abused: No   Sexually Abused: No     Review of Systems: General: No chills, fever, night sweats or weight changes  Cardiovascular:  No chest pain, dyspnea on exertion, edema, orthopnea, palpitations, paroxysmal nocturnal dyspnea Dermatological: No rash, lesions or masses Respiratory: No cough, dyspnea Urologic: No hematuria, dysuria Abdominal: No nausea, vomiting, diarrhea, bright red blood per rectum, melena, or hematemesis Neurologic: No visual changes, weakness, changes in mental status All other systems reviewed and are otherwise negative except as noted above.  Physical Exam: Vitals:   01/07/21 0926  BP: (!) 134/94  Pulse:  (!) 115  SpO2: 97%  Weight: 215 lb 3.2 oz (97.6 kg)  Height: 5\' 8"  (1.727 m)    GEN- The patient is well appearing, alert and oriented x 3 today.   HEENT: normocephalic, atraumatic; sclera clear, conjunctiva pink; hearing intact; oropharynx clear; neck supple, no JVP Lymph- no cervical lymphadenopathy Lungs- Clear to ausculation bilaterally, normal work of breathing.  No wheezes, rales, rhonchi Heart- Regular rate and rhythm, no murmurs, rubs or gallops, PMI not laterally displaced GI- soft, non-tender, non-distended, bowel sounds present, no hepatosplenomegaly Extremities- no clubbing, cyanosis, or edema; DP/PT/radial pulses 2+ bilaterally MS- no significant deformity or atrophy Skin- warm and dry, no rash or lesion Psych- euthymic mood, full affect Neuro- strength and sensation are intact  EKG is ordered. Personal review of EKG from today shows AF with RVR  at 115 bpm  Additional studies reviewed include: Previous EP and AF clinic notes.  Assessment and Plan:  1. Permanent atrial fibrillation Pursuing rate control per visits with AF clinic Increase toprol to 25 mg daily Poor candidate for NSR Continue eliquis for CHA2DS2VASC of at least 5.   If rates remain elevated, will update Echo at next visit to make sure his EF is tolerating.   2. Secondary Hypercoagulable State (ICD10:  D68.69) The patient is at significant risk for stroke/thromboembolism based upon his CHA2DS2-VASc Score of 5.  Continue Apixaban (Eliquis).   3. HTN Continue current medications  RTC 6 weeks. Sooner with issues. BMET today. Increase toprol as above. May end up needing to cut back chlorthalidone.   Patrick Friar, PA-C  01/06/21 1:14 PM

## 2021-01-06 NOTE — Telephone Encounter (Signed)
Pt called to report that his BP has been fluctuating over he past several weeks... he does not have a list of his readings but his reading this morning sounded strange... 120/114.... he says that he is feeling well but he has been concerned about hs BP being so inconsistent... he is not sure of his HR... he says that his cuff does not store readings to give [place].   He denies headache, palpitations, dyspnea, dizziness.   I asked him to record any further readings he gets.. he is overdue for his OV with Dr. [name], a recall letter had been sent to him... I made him an appt with A Tillery PA for tomorrow. He will bring his cuff with him.   He will also call his PCP for an appt.... they seemed to have been managing his BP and appears he may have bee non- compliant according to their notes with his meds and keeping appts. Pt agreed.   Pt also given the ED option if he develops any worsening/ new symptoms suggestive of elevtaed BP/ stroke.

## 2021-01-06 NOTE — Telephone Encounter (Signed)
    Pt c/o BP issue: STAT if pt c/o blurred vision, one-sided weakness or slurred speech  1. What are your last 5 BP readings? BP 110-120/90-110 HR 114  2. Are you having any other symptoms (ex. Dizziness, headache, blurred vision, passed out)? None  3. What is your BP issue? Pt said he's been having issue with his BP, he doesn't have other symptoms but he is concern his BP is higher than usual. She would like to speak with Dr. [name] nurse

## 2021-01-07 ENCOUNTER — Encounter: Payer: Self-pay | Admitting: Student

## 2021-01-07 ENCOUNTER — Other Ambulatory Visit: Payer: Self-pay

## 2021-01-07 ENCOUNTER — Ambulatory Visit: Payer: Medicare HMO | Admitting: Student

## 2021-01-07 VITALS — BP 134/94 | HR 115 | Ht 68.0 in | Wt 215.2 lb

## 2021-01-07 DIAGNOSIS — I10 Essential (primary) hypertension: Secondary | ICD-10-CM

## 2021-01-07 DIAGNOSIS — I4819 Other persistent atrial fibrillation: Secondary | ICD-10-CM

## 2021-01-07 DIAGNOSIS — D6869 Other thrombophilia: Secondary | ICD-10-CM

## 2021-01-07 LAB — BASIC METABOLIC PANEL
BUN/Creatinine Ratio: 14 (ref 10–24)
BUN: 19 mg/dL (ref 8–27)
CO2: 26 mmol/L (ref 20–29)
Calcium: 9.4 mg/dL (ref 8.6–10.2)
Chloride: 101 mmol/L (ref 96–106)
Creatinine, Ser: 1.37 mg/dL — ABNORMAL HIGH (ref 0.76–1.27)
GFR calc Af Amer: 58 mL/min/{1.73_m2} — ABNORMAL LOW (ref >59)
GFR calc non Af Amer: 50 mL/min/{1.73_m2} — ABNORMAL LOW (ref >59)
Glucose: 95 mg/dL (ref 65–99)
Potassium: 3.9 mmol/L (ref 3.5–5.2)
Sodium: 142 mmol/L (ref 134–144)

## 2021-01-07 MED ORDER — METOPROLOL SUCCINATE ER 25 MG PO TB24: 25.0000 mg | ORAL_TABLET | Freq: Every day | ORAL | 3 refills | Status: DC

## 2021-01-07 NOTE — Patient Instructions (Addendum)
Medication Instructions:  Your physician has recommended you make the following change in your medication:   INCREASE: Metoprolol to 25mg  daily  *If you need a refill on your cardiac medications before your next appointment, please call your pharmacy*   Lab Work: TODAY: BMET  If you have labs (blood work) drawn today and your tests are completely normal, you will receive your results only by: Marland Kitchen MyChart Message (if you have MyChart) OR . A paper copy in the mail If you have any lab test that is abnormal or we need to change your treatment, we will call you to review the results.   Follow-Up: At [facility], you and your health needs are our priority.  As part of our continuing mission to provide you with exceptional heart care, we have created designated Provider Care Teams.  These Care Teams include your primary Cardiologist (physician) and Advanced Practice Providers (APPs -  Physician Assistants and Nurse Practitioners) who all work together to provide you with the care you need, when you need it.  Your next appointment:   02/18/2021  The format for your next appointment:   In Person  Provider:   Legrand Como "Jonni Sanger" Chalmers Cater, [place]

## 2021-01-08 ENCOUNTER — Telehealth: Payer: Self-pay | Admitting: Student

## 2021-01-08 ENCOUNTER — Other Ambulatory Visit: Payer: Self-pay

## 2021-01-08 ENCOUNTER — Encounter: Payer: Self-pay | Admitting: Physician Assistant

## 2021-01-08 ENCOUNTER — Ambulatory Visit (INDEPENDENT_AMBULATORY_CARE_PROVIDER_SITE_OTHER): Payer: Medicare HMO | Admitting: Physician Assistant

## 2021-01-08 VITALS — BP 122/87 | HR 114 | Temp 97.6°F | Ht 68.0 in | Wt 214.2 lb

## 2021-01-08 DIAGNOSIS — I10 Essential (primary) hypertension: Secondary | ICD-10-CM | POA: Diagnosis not present

## 2021-01-08 DIAGNOSIS — I484 Atypical atrial flutter: Secondary | ICD-10-CM | POA: Diagnosis not present

## 2021-01-08 DIAGNOSIS — L821 Other seborrheic keratosis: Secondary | ICD-10-CM

## 2021-01-08 DIAGNOSIS — M5416 Radiculopathy, lumbar region: Secondary | ICD-10-CM

## 2021-01-08 DIAGNOSIS — L989 Disorder of the skin and subcutaneous tissue, unspecified: Secondary | ICD-10-CM

## 2021-01-08 NOTE — Telephone Encounter (Signed)
    Pt c/o medication issue:  1. Name of Medication:   metoprolol succinate (TOPROL XL) 25 MG 24 hr tablet    2. How are you currently taking this medication (dosage and times per day)?Take 1 tablet (25 mg total) by mouth daily.  3. Are you having a reaction (difficulty breathing--STAT)?   4. What is your medication issue? Pt would like to let Jonni Sanger know he is already taking 25 mg of his metoprolol

## 2021-01-08 NOTE — Patient Instructions (Signed)
Pleasure meeting you today!  See you back in 6-8 months for Annual Wellness Visit. Please call sooner if any concerns.    Skin Biopsy, Care After This sheet gives you information about how to care for yourself after your procedure. Your health care provider may also give you more specific instructions. If you have problems or questions, contact your health care provider. What can I expect after the procedure? After the procedure, it is common to have:  Soreness.  Bruising.  Itching. Follow these instructions at home: Biopsy site care Follow instructions from your health care provider about how to take care of your biopsy site. Make sure you:  Wash your hands with soap and water before and after you change your bandage (dressing). If soap and water are not available, use hand sanitizer.  Apply ointment on your biopsy site as directed by your health care provider.  Change your dressing as told by your health care provider.  Leave stitches (sutures), skin glue, or adhesive strips in place. These skin closures may need to stay in place for 2 weeks or longer. If adhesive strip edges start to loosen and curl up, you may trim the loose edges. Do not remove adhesive strips completely unless your health care provider tells you to do that.  If the biopsy area bleeds, apply gentle pressure for 10 minutes. Check your biopsy site every day for signs of infection. Check for:  Redness, swelling, or pain.  Fluid or blood.  Warmth.  Pus or a bad smell.   General instructions  Rest and then return to your normal activities as told by your health care provider.  Take over-the-counter and prescription medicines only as told by your health care provider.  Keep all follow-up visits as told by your health care provider. This is important. Contact a health care provider if:  You have redness, swelling, or pain around your biopsy site.  You have fluid or blood coming from your biopsy  site.  Your biopsy site feels warm to the touch.  You have pus or a bad smell coming from your biopsy site.  You have a fever.  Your sutures, skin glue, or adhesive strips loosen or come off sooner than expected. Get help right away if:  You have bleeding that does not stop with pressure or a dressing. Summary  After the procedure, it is common to have soreness, bruising, and itching at the site.  Follow instructions from your health care provider about how to take care of your biopsy site.  Check your biopsy site every day for signs of infection.  Contact a health care provider if you have redness, swelling, or pain around your biopsy site, or your biopsy site feels warm to the touch.  Keep all follow-up visits as told by your health care provider. This is important. This information is not intended to replace advice given to you by your health care provider. Make sure you discuss any questions you have with your health care provider. Document Revised: 05/08/2018 Document Reviewed: 05/08/2018 Elsevier Patient Education  [employer].

## 2021-01-08 NOTE — Progress Notes (Signed)
New Patient Office Visit  Subjective:  Patient ID: Patrick Roth, male    DOB: 10/17/1946  Age: 75 y.o. MRN: [id number]  CC:  Chief Complaint  Patient presents with  . Rash    Left Arm     HPI Patrick Roth presents for [facility] visit from [facility], [place]. Today he is concerned about an area on his L elbow that his wife noticed. It does not itch or hurt.    He is currently seeing a cardiologist, Dr. Chalmers Cater, for atrial flutter and malignant HTN. He had a Cardioversion in 2018 and says he has felt really good since then. Hx of CVA in 2008, no long-term complications from that per patient. Takes Eliquis.   Hx of lumbar radiculopathy - sees Dr. Arrie Eastern. He has previously been Rx'd Percocet for the pain, but has not needed this in about a year.    Past Medical History:  Diagnosis Date  . Atrial flutter ([place])   . Colon polyps   . Constipation 05/27/2015  . DOE (dyspnea on exertion) 10/13/2015  . Faintness 06/20/2016  . History of chicken pox   . Hyperlipemia 02/13/2016  . Hyperlipidemia   . Hypertension   . Hypokalemia 06/28/2016  . Hyponatremia 06/28/2016  . Internal hemorrhoids   . Irregular heartbeat   . Malignant hypertension 07/15/2015  . Sciatica   . Stroke [facility]) 2008  . Syncope and collapse 06/28/2016    Past Surgical History:  Procedure Laterality Date  . CARDIOVERSION N/A 09/02/2017   Procedure: CARDIOVERSION;  Surgeon: [name], MD;  Location: [facility] ENDOSCOPY;  Service: Cardiovascular;  Laterality: N/A;  . COLONOSCOPY  2011  . great toe surgery    . PROSTATE BIOPSY      Family History  Problem Relation Age of Onset  . Hypertension Father 59       Deceased  . Heart disease Father   . Lung cancer Mother 3       Deceased  . Healthy Sister        x3  . Healthy Brother        x4  . Heart disease Brother        #5  . Other Daughter        Alpha Thalassemia  . Lupus Daughter        #2-deceased  . Diabetes Neg Hx   . Heart attack Neg Hx   . Hyperlipidemia  Neg Hx   . Sudden death Neg Hx     Social History   Socioeconomic History  . Marital status: Married    Spouse name: Not on file  . Number of children: 1  . Years of education: 49  . Highest education level: Not on file  Occupational History  . Occupation: Retired  Tobacco Use  . Smoking status: Never Smoker  . Smokeless tobacco: Never Used  Vaping Use  . Vaping Use: Never used  Substance and Sexual Activity  . Alcohol use: No  . Drug use: No  . Sexual activity: Not on file  Other Topics Concern  . Not on file  Social History Narrative   Fun: Play golf and build furniture.    Social Determinants of Health   Financial Resource Strain: Low Risk   . Difficulty of Paying Living Expenses: Not hard at all  Food Insecurity: No Food Insecurity  . Worried About Charity fundraiser in the Last Year: Never true  . Ran Out of Food in the Last  Year: Never true  Transportation Needs: No Transportation Needs  . Lack of Transportation (Medical): No  . Lack of Transportation (Non-Medical): No  Physical Activity: Sufficiently Active  . Days of Exercise per Week: 7 days  . Minutes of Exercise per Session: 30 min  Stress: No Stress Concern Present  . Feeling of Stress : Not at all  Social Connections: Moderately Isolated  . Frequency of Communication with Friends and Family: More than three times a week  . Frequency of Social Gatherings with Friends and Family: Once a week  . Attends Religious Services: Never  . Active Member of Clubs or Organizations: No  . Attends Archivist Meetings: Never  . Marital Status: Married  Human resources officer Violence: Not At Risk  . Fear of Current or Ex-Partner: No  . Emotionally Abused: No  . Physically Abused: No  . Sexually Abused: No    ROS Review of Systems  Constitutional: Negative for activity change, appetite change and unexpected weight change.  HENT: Negative for congestion.   Eyes: Negative for visual disturbance.   Respiratory: Negative for apnea and shortness of breath.   Cardiovascular: Negative for chest pain.  Gastrointestinal: Negative for abdominal pain and blood in stool.  Endocrine: Negative for polydipsia, polyphagia and polyuria.  Genitourinary: Negative for difficulty urinating.  Musculoskeletal: Negative for arthralgias.  Skin: Negative for rash.  Neurological: Negative for seizures, weakness and headaches.  Psychiatric/Behavioral: Negative for sleep disturbance and suicidal ideas.    Objective:   Today's Vitals: BP 122/87   Pulse (!) 114   Temp 97.6 F (36.4 C)   Ht 5\' 8"  (1.727 m)   Wt 214 lb 3.2 oz (97.2 kg)   SpO2 99%   BMI 32.57 kg/m   Physical Exam Vitals and nursing note reviewed.  Constitutional:      General: He is not in acute distress.    Appearance: Normal appearance. He is not toxic-appearing.  HENT:     Head: Normocephalic and atraumatic.     Right Ear: Tympanic membrane, ear canal and external ear normal.     Left Ear: Tympanic membrane, ear canal and external ear normal.     Nose: Nose normal.     Mouth/Throat:     Mouth: Mucous membranes are moist.     Pharynx: Oropharynx is clear.  Eyes:     Extraocular Movements: Extraocular movements intact.     Conjunctiva/sclera: Conjunctivae normal.     Pupils: Pupils are equal, round, and reactive to light.  Cardiovascular:     Rate and Rhythm: Normal rate. Rhythm irregular.     Pulses: Normal pulses.     Heart sounds: Normal heart sounds.  Pulmonary:     Effort: Pulmonary effort is normal.     Breath sounds: Normal breath sounds.  Abdominal:     General: Abdomen is flat. Bowel sounds are normal.     Palpations: Abdomen is soft.     Tenderness: There is no abdominal tenderness.  Musculoskeletal:        General: Normal range of motion.       Arms:     Cervical back: Normal range of motion and neck supple.  Skin:    General: Skin is warm and dry.  Neurological:     General: No focal deficit present.      Mental Status: He is alert and oriented to person, place, and time.  Psychiatric:        Mood and Affect: Mood normal.  Behavior: Behavior normal.     Assessment & Plan:   Problem List Items Addressed This Visit      Cardiovascular and Mediastinum   Essential hypertension   Atypical atrial flutter (HCC)     Nervous and Auditory   Lumbar radiculopathy, chronic    Other Visit Diagnoses    Skin lesion of left arm    -  Primary   Relevant Orders   Dermatology pathology      Outpatient Encounter Medications as of 01/08/2021  Medication Sig  . acetaminophen (TYLENOL) 650 MG CR tablet Take 650 mg by mouth every 8 (eight) hours as needed for pain.  Marland Kitchen amLODipine (NORVASC) 10 MG tablet Take 10 mg by mouth daily.  . chlorthalidone (HYGROTON) 25 MG tablet Take 1 tablet by mouth once daily  . ELIQUIS 5 MG TABS tablet Take 1 tablet by mouth twice daily  . gabapentin (NEURONTIN) 100 MG capsule Take 1 capsule by mouth 4 times daily  . metoprolol succinate (TOPROL XL) 25 MG 24 hr tablet Take 1 tablet (25 mg total) by mouth daily.  Marland Kitchen oxyCODONE-acetaminophen (PERCOCET/ROXICET) 5-325 MG tablet Take 1 tablet by mouth every 4 (four) hours as needed for severe pain.  . potassium chloride SA (KLOR-CON) 20 MEQ tablet TAKE 1 TABLET BY MOUTH IN THE MORNING AND 2 IN THE EVENING  . psyllium (REGULOID) 0.52 g capsule Take by mouth.   No facility-administered encounter medications on file as of 01/08/2021.    Follow-up: Return in about 6 months (around 07/08/2021) for AWV.   1. Skin lesion of left arm Procedure explained and consent obtained. Area cleaned in usual manner. Local anesthesia with 3 cc of Lidocaine 1% with Epi, using a 25 gauge needle. Dermablade used to remove the lesion. Hemostasis with Drysol. Band-Aid with Vaseline applied. Patient tolerated procedure well. Aftercare instructions provided. Lesion sent for pathology.   2. Atypical atrial flutter ([place]) No issues since  cardioversion per patient. Cont f/up with Cardiology. Eliquis 5 mg BID.   3. Essential hypertension Currently stable. Amlodipine 10 mg qd, Chlorthalidone 25 mg qd, Toprol XL 25 mg qd. Monitor at home. Limit salt in diet and drink plenty of water.  4. Lumbar radiculopathy, chronic Stable. Taking Gabapentin 100 mg 1 cap po QID. Sees specialist as needed.   This visit occurred during the SARS-CoV-2 public health emergency.  Safety protocols were in place, including screening questions prior to the visit, additional usage of staff PPE, and extensive cleaning of exam room while observing appropriate contact time as indicated for disinfecting solutions.    Keauna Brasel M Dessire Grimes, PA-C

## 2021-01-09 NOTE — Telephone Encounter (Signed)
LM for pt to call back. He was supposed to be taking Metoprolol Succinate 25mg  1/2 tablet daily and we increased it 25mg  daily. Will discuss this with pt when he calls back.

## 2021-01-16 ENCOUNTER — Emergency Department (HOSPITAL_BASED_OUTPATIENT_CLINIC_OR_DEPARTMENT_OTHER): Payer: Medicare HMO

## 2021-01-16 ENCOUNTER — Other Ambulatory Visit: Payer: Self-pay

## 2021-01-16 ENCOUNTER — Emergency Department (HOSPITAL_COMMUNITY): Payer: Medicare HMO

## 2021-01-16 ENCOUNTER — Observation Stay (HOSPITAL_COMMUNITY): Payer: Medicare HMO

## 2021-01-16 ENCOUNTER — Encounter (HOSPITAL_BASED_OUTPATIENT_CLINIC_OR_DEPARTMENT_OTHER): Payer: Self-pay | Admitting: Emergency Medicine

## 2021-01-16 ENCOUNTER — Observation Stay (HOSPITAL_BASED_OUTPATIENT_CLINIC_OR_DEPARTMENT_OTHER)
Admission: EM | Admit: 2021-01-16 | Discharge: 2021-01-17 | Disposition: A | Discharge status: Home / Self Care | Payer: Medicare HMO | Attending: Internal Medicine | Admitting: Internal Medicine

## 2021-01-16 DIAGNOSIS — E782 Mixed hyperlipidemia: Secondary | ICD-10-CM | POA: Diagnosis not present

## 2021-01-16 DIAGNOSIS — Z20822 Contact with and (suspected) exposure to covid-19: Secondary | ICD-10-CM | POA: Insufficient documentation

## 2021-01-16 DIAGNOSIS — M5416 Radiculopathy, lumbar region: Secondary | ICD-10-CM | POA: Diagnosis not present

## 2021-01-16 DIAGNOSIS — M62838 Other muscle spasm: Secondary | ICD-10-CM

## 2021-01-16 DIAGNOSIS — I6782 Cerebral ischemia: Secondary | ICD-10-CM | POA: Diagnosis not present

## 2021-01-16 DIAGNOSIS — I639 Cerebral infarction, unspecified: Principal | ICD-10-CM | POA: Insufficient documentation

## 2021-01-16 DIAGNOSIS — R200 Anesthesia of skin: Secondary | ICD-10-CM

## 2021-01-16 DIAGNOSIS — I10 Essential (primary) hypertension: Secondary | ICD-10-CM | POA: Diagnosis not present

## 2021-01-16 DIAGNOSIS — R0602 Shortness of breath: Secondary | ICD-10-CM

## 2021-01-16 DIAGNOSIS — Z79899 Other long term (current) drug therapy: Secondary | ICD-10-CM | POA: Diagnosis not present

## 2021-01-16 DIAGNOSIS — Z7901 Long term (current) use of anticoagulants: Secondary | ICD-10-CM | POA: Insufficient documentation

## 2021-01-16 DIAGNOSIS — E876 Hypokalemia: Secondary | ICD-10-CM | POA: Diagnosis present

## 2021-01-16 DIAGNOSIS — I4892 Unspecified atrial flutter: Secondary | ICD-10-CM | POA: Diagnosis not present

## 2021-01-16 DIAGNOSIS — I63522 Cerebral infarction due to unspecified occlusion or stenosis of left anterior cerebral artery: Secondary | ICD-10-CM | POA: Diagnosis not present

## 2021-01-16 DIAGNOSIS — R000 Tachycardia, unspecified: Secondary | ICD-10-CM | POA: Diagnosis not present

## 2021-01-16 DIAGNOSIS — R29818 Other symptoms and signs involving the nervous system: Secondary | ICD-10-CM | POA: Diagnosis not present

## 2021-01-16 DIAGNOSIS — G319 Degenerative disease of nervous system, unspecified: Secondary | ICD-10-CM | POA: Diagnosis not present

## 2021-01-16 DIAGNOSIS — T502X5A Adverse effect of carbonic-anhydrase inhibitors, benzothiadiazides and other diuretics, initial encounter: Secondary | ICD-10-CM | POA: Diagnosis not present

## 2021-01-16 DIAGNOSIS — R449 Unspecified symptoms and signs involving general sensations and perceptions: Secondary | ICD-10-CM | POA: Diagnosis not present

## 2021-01-16 DIAGNOSIS — R202 Paresthesia of skin: Secondary | ICD-10-CM | POA: Diagnosis not present

## 2021-01-16 DIAGNOSIS — R481 Agnosia: Secondary | ICD-10-CM

## 2021-01-16 LAB — CBC WITH DIFFERENTIAL/PLATELET
Abs Immature Granulocytes: 0.02 10*3/uL (ref 0.00–0.07)
Basophils Absolute: 0 10*3/uL (ref 0.0–0.1)
Basophils Relative: 1 %
Eosinophils Absolute: 0 10*3/uL (ref 0.0–0.5)
Eosinophils Relative: 1 %
HCT: 53.1 % — ABNORMAL HIGH (ref 39.0–52.0)
Hemoglobin: 17.6 g/dL — ABNORMAL HIGH (ref 13.0–17.0)
Immature Granulocytes: 0 %
Lymphocytes Relative: 34 %
Lymphs Abs: 2.1 10*3/uL (ref 0.7–4.0)
MCH: 29.6 pg (ref 26.0–34.0)
MCHC: 33.1 g/dL (ref 30.0–36.0)
MCV: 89.2 fL (ref 80.0–100.0)
Monocytes Absolute: 0.8 10*3/uL (ref 0.1–1.0)
Monocytes Relative: 12 %
Neutro Abs: 3.3 10*3/uL (ref 1.7–7.7)
Neutrophils Relative %: 52 %
Platelets: 178 10*3/uL (ref 150–400)
RBC: 5.95 MIL/uL — ABNORMAL HIGH (ref 4.22–5.81)
RDW: 13.3 % (ref 11.5–15.5)
WBC: 6.2 10*3/uL (ref 4.0–10.5)
nRBC: 0 % (ref 0.0–0.2)

## 2021-01-16 LAB — COMPREHENSIVE METABOLIC PANEL
ALT: 30 U/L (ref 0–44)
AST: 28 U/L (ref 15–41)
Albumin: 4 g/dL (ref 3.5–5.0)
Alkaline Phosphatase: 61 U/L (ref 38–126)
Anion gap: 10 (ref 5–15)
BUN: 14 mg/dL (ref 8–23)
CO2: 29 mmol/L (ref 22–32)
Calcium: 9 mg/dL (ref 8.9–10.3)
Chloride: 98 mmol/L (ref 98–111)
Creatinine, Ser: 1.31 mg/dL — ABNORMAL HIGH (ref 0.61–1.24)
GFR, Estimated: 57 mL/min — ABNORMAL LOW (ref >60)
Glucose, Bld: 93 mg/dL (ref 70–99)
Potassium: 3.2 mmol/L — ABNORMAL LOW (ref 3.5–5.1)
Sodium: 137 mmol/L (ref 135–145)
Total Bilirubin: 1.8 mg/dL — ABNORMAL HIGH (ref 0.3–1.2)
Total Protein: 7.4 g/dL (ref 6.5–8.1)

## 2021-01-16 LAB — URINALYSIS, ROUTINE W REFLEX MICROSCOPIC
Bilirubin Urine: NEGATIVE
Glucose, UA: NEGATIVE mg/dL
Hgb urine dipstick: NEGATIVE
Ketones, ur: NEGATIVE mg/dL
Leukocytes,Ua: NEGATIVE
Nitrite: NEGATIVE
Protein, ur: NEGATIVE mg/dL
Specific Gravity, Urine: 1.01 (ref 1.005–1.030)
pH: 7.5 (ref 5.0–8.0)

## 2021-01-16 LAB — LIPASE, BLOOD: Lipase: 30 U/L (ref 11–51)

## 2021-01-16 MED ORDER — OXYCODONE-ACETAMINOPHEN 5-325 MG PO TABS
1.0000 | ORAL_TABLET | Freq: Four times a day (QID) | ORAL | Status: DC | PRN
Start: 2021-01-16 — End: 2021-01-18

## 2021-01-16 MED ORDER — POTASSIUM CHLORIDE CRYS ER 20 MEQ PO TBCR: 40.0000 meq | EXTENDED_RELEASE_TABLET | ORAL | Status: DC

## 2021-01-16 MED ORDER — METOPROLOL TARTRATE 5 MG/5ML IV SOLN
2.5000 mg | Freq: Four times a day (QID) | INTRAVENOUS | Status: DC
  Administered 2021-01-16 – 2021-01-17 (×2): 2.5 mg via INTRAVENOUS
  Filled 2021-01-16 (×2): qty 5

## 2021-01-16 MED ORDER — POTASSIUM CHLORIDE CRYS ER 20 MEQ PO TBCR
40.0000 meq | EXTENDED_RELEASE_TABLET | Freq: Once | ORAL | Status: AC
  Administered 2021-01-16: 40 meq via ORAL
  Filled 2021-01-16: qty 2

## 2021-01-16 MED ORDER — ACETAMINOPHEN 160 MG/5ML PO SOLN: 650.0000 mg | ORAL | Status: DC | PRN

## 2021-01-16 MED ORDER — SODIUM CHLORIDE 0.9 % IV BOLUS
1000.0000 mL | Freq: Once | INTRAVENOUS | Status: AC
  Administered 2021-01-17: 1000 mL via INTRAVENOUS

## 2021-01-16 MED ORDER — STROKE: EARLY STAGES OF RECOVERY BOOK: Freq: Once | Status: DC

## 2021-01-16 MED ORDER — IOHEXOL 350 MG/ML SOLN
75.0000 mL | Freq: Once | INTRAVENOUS | Status: AC | PRN
  Administered 2021-01-16: 75 mL via INTRAVENOUS

## 2021-01-16 MED ORDER — APIXABAN 5 MG PO TABS
5.0000 mg | ORAL_TABLET | Freq: Two times a day (BID) | ORAL | Status: DC
  Administered 2021-01-16 – 2021-01-17 (×2): 5 mg via ORAL
  Filled 2021-01-16 (×2): qty 1

## 2021-01-16 MED ORDER — POLYETHYLENE GLYCOL 3350 17 G PO PACK: 17.0000 g | PACK | Freq: Every day | ORAL | Status: DC | PRN

## 2021-01-16 MED ORDER — ACETAMINOPHEN 325 MG PO TABS: 650.0000 mg | ORAL_TABLET | ORAL | Status: DC | PRN

## 2021-01-16 MED ORDER — ACETAMINOPHEN 650 MG RE SUPP: 650.0000 mg | RECTAL | Status: DC | PRN

## 2021-01-16 MED ORDER — SODIUM CHLORIDE 0.9 % IV SOLN: INTRAVENOUS | Status: DC

## 2021-01-16 MED ORDER — ONDANSETRON HCL 4 MG/2ML IJ SOLN: 4.0000 mg | Freq: Four times a day (QID) | INTRAMUSCULAR | Status: DC | PRN

## 2021-01-16 MED ORDER — LACTATED RINGERS IV SOLN: INTRAVENOUS | Status: AC

## 2021-01-16 NOTE — ED Provider Notes (Signed)
I received this patient in transfer from [place], where he was initially evaluated by my colleague Dr. Sherry Ruffing.  He had presented with right-sided numbness, history of stroke and had been recommended for transfer to receive MRI brain.  MRI does confirm several areas of tiny acute infarct.  Discern for possible embolic process despite the fact that he is on Eliquis.  Discussed with neurology, they will see the patient in consultation.  Discussed admission with [facility] hospitalist, Dr. Cyd Silence.   Asaiah Hunnicutt, Wenda Overland, MD 01/16/21 [phone]

## 2021-01-16 NOTE — H&P (Addendum)
History and Physical    NAS WAFER OVZ:[id number] DOB: [birth year] DOA: 01/16/2021  PCP: Patient, No Pcp Per  Patient coming from: [facility] ED to ED transfer   Chief Complaint:  Chief Complaint  Patient presents with  . Numbness    Right side body     HPI:    75 year old male with past medical history of previous stroke (2008), atrial flutter, hypertension, hyperlipidemia, lumbar radiculopathy who presents to [facility] emergency department with complaints of right-sided numbness and tingling.    Patient explains that for the past 3 days he has felt "extremely anxious."  He explains that he has felt slight shortness of breath "like I cannot catch my breath" over the span of time.  Patient states that in the past several days whenever he checks his blood pressure he noticed that his heart rate has been around 140 bpm.  The symptoms were mild without alleviating or exacerbating factors and ongoing until yesterday evening prior to going to bed the patient noticed that he began to develop right-sided tingling and numbness.  Patient states that the symptoms began in his right lower extremity as the evening progressed and into the following morning began to spread to his entire right side.  Patient denies any associated weakness, loss of balance, changes in vision, slurred speech, facial droop, headache or confusion.  Patient denies any fevers, sick contacts, recent travel or contact with confirmed COVID-19 infection.  Of note, patient reports being compliant with his usual regimen of Eliquis twice daily and metoprolol for his atrial flutter.  The following morning the patient decided to present to [place] emergency department due to persisting symptoms.  Upon evaluation, patient was felt to be outside of the TPA window. Due to concerns for an acute stroke Case was discussed between ED provider and Dr. Malen Gauze with neurology who recommended EGD ED transfer to [facility] for  further evaluation.  Upon arrival hospitalist group has been called to assess patient for admission the hospital.  Review of Systems:   Review of Systems  Respiratory: Positive for shortness of breath.   Neurological: Positive for tingling and sensory change.  All other systems reviewed and are negative.   Past Medical History:  Diagnosis Date  . Atrial flutter ([place])   . Colon polyps   . Constipation 05/27/2015  . DOE (dyspnea on exertion) 10/13/2015  . Faintness 06/20/2016  . History of chicken pox   . Hyperlipemia 02/13/2016  . Hyperlipidemia   . Hypertension   . Hypokalemia 06/28/2016  . Hyponatremia 06/28/2016  . Internal hemorrhoids   . Irregular heartbeat   . Malignant hypertension 07/15/2015  . Sciatica   . Stroke [facility]) 2008  . Syncope and collapse 06/28/2016    Past Surgical History:  Procedure Laterality Date  . CARDIOVERSION N/A 09/02/2017   Procedure: CARDIOVERSION;  Surgeon: [name], MD;  Location: [facility] ENDOSCOPY;  Service: Cardiovascular;  Laterality: N/A;  . COLONOSCOPY  2011  . great toe surgery    . PROSTATE BIOPSY       reports that he has never smoked. He has never used smokeless tobacco. He reports that he does not drink alcohol and does not use drugs.  Allergies  Allergen Reactions  . Crestor [Rosuvastatin Calcium] Shortness Of Breath    Muscle cramps  . Spironolactone Shortness Of Breath  . Terazosin Shortness Of Breath, Palpitations and Other (See Comments)    Nerve pain  . Amiodarone  Dropped pulse real low to 45-50, and made him feel very cold  . Atenolol Other (See Comments)    Drowsiness and "flu sxs"  . Bystolic [Nebivolol Hcl] Other (See Comments)    GI issues  . Hydralazine Other (See Comments)    Joint swelling  . Tizanidine Other (See Comments)    Syncope, Elevated BP/pulse  . Amlodipine Other (See Comments)  . Clonidine Other (See Comments)  . Hydrocodone-Acetaminophen Other (See Comments)  . Levofloxacin Other (See  Comments)  . Losartan Other (See Comments)    Did not work.  Pt states he couldn't take b/c of SE, but can't remember what SE were.  . Maxzide [Hydrochlorothiazide W-Triamterene] Other (See Comments)    Does not tolerate potassium-sparing diuretics  . Olmesartan Other (See Comments)  . Tape Other (See Comments)    Other reaction(s): Unknown  . Tussionex Pennkinetic Er [Hydrocod Polst-Cpm Polst Er] Other (See Comments)    Other reaction(s): Unknown    Family History  Problem Relation Age of Onset  . Hypertension Father 11       Deceased  . Heart disease Father   . Lung cancer Mother 59       Deceased  . Healthy Sister        x3  . Healthy Brother        x4  . Heart disease Brother        #5  . Other Daughter        Alpha Thalassemia  . Lupus Daughter        #2-deceased  . Diabetes Neg Hx   . Heart attack Neg Hx   . Hyperlipidemia Neg Hx   . Sudden death Neg Hx      Prior to Admission medications   Medication Sig Start Date End Date Taking? Authorizing Provider  acetaminophen (TYLENOL) 650 MG CR tablet Take 650 mg by mouth every 8 (eight) hours as needed for pain.    [provider]  amLODipine (NORVASC) 10 MG tablet Take 10 mg by mouth daily.    [provider]  chlorthalidone (HYGROTON) 25 MG tablet Take 1 tablet by mouth once daily 12/31/20   Midge Minium, MD  ELIQUIS 5 MG TABS tablet Take 1 tablet by mouth twice daily 08/11/20   Camnitz, Ocie Doyne, MD  gabapentin (NEURONTIN) 100 MG capsule Take 1 capsule by mouth 4 times daily 09/12/20   Brunetta Jeans, PA-C  metoprolol succinate (TOPROL XL) 25 MG 24 hr tablet Take 1 tablet (25 mg total) by mouth daily. 01/07/21 01/07/22  Shirley Friar, PA-C  oxyCODONE-acetaminophen (PERCOCET/ROXICET) 5-325 MG tablet Take 1 tablet by mouth every 4 (four) hours as needed for severe pain. 01/24/20   Malvin Johns, MD  potassium chloride SA (KLOR-CON) 20 MEQ tablet TAKE 1 TABLET BY MOUTH IN THE MORNING AND  2 IN THE EVENING 08/08/20   Brunetta Jeans, PA-C  psyllium (REGULOID) 0.52 g capsule Take by mouth.    [provider]    Physical Exam: Vitals:   01/16/21 1400 01/16/21 1500 01/16/21 1634 01/16/21 1901  BP: (!) 126/95 (!) 123/98 (!) 144/105 (!) 122/104  Pulse: (!) 109 (!) 108 (!) 109 (!) 109  Resp: 18 17 20 15   Temp:   97.6 F (36.4 C)   TempSrc:   Oral   SpO2: 99% 100% 96% 100%    Constitutional: Acute alert and oriented x3, no associated distress.   Skin: no rashes, no lesions, good skin turgor  noted. Eyes: Pupils are equally reactive to light.  No evidence of scleral icterus or conjunctival pallor.  ENMT: Moist mucous membranes noted.  Posterior pharynx clear of any exudate or lesions.   Neck: normal, supple, no masses, no thyromegaly.  No evidence of jugular venous distension.   Respiratory: clear to auscultation bilaterally, no wheezing, no crackles. Normal respiratory effort. No accessory muscle use.  Cardiovascular: Tachycardic and irregularly irregular rhythm, no murmurs / rubs / gallops. No extremity edema. 2+ pedal pulses. No carotid bruits.  Chest:   Nontender without crepitus or deformity.   Back:   Nontender without crepitus or deformity. Abdomen: Abdomen is soft and nontender.  No evidence of intra-abdominal masses.  Positive bowel sounds noted in all quadrants.   Musculoskeletal: No joint deformity upper and lower extremities. Good ROM, no contractures. Normal muscle tone.  Neurologic: CN 2-12 grossly intact. Sensation intact.  Patient moving all 4 extremities spontaneously.  Patient is following all commands.  Patient is responsive to verbal stimuli.   Psychiatric: Patient exhibits normal mood with appropriate affect.  Patient seems to possess insight as to their current situation.     Labs on Admission: I have personally reviewed following labs and imaging studies -   CBC: Recent Labs  Lab 01/16/21 1122  WBC 6.2  NEUTROABS 3.3  HGB 17.6*  HCT 53.1*   MCV 89.2  PLT 099   Basic Metabolic Panel: Recent Labs  Lab 01/16/21 1122  NA 137  K 3.2*  CL 98  CO2 29  GLUCOSE 93  BUN 14  CREATININE 1.31*  CALCIUM 9.0   GFR: Estimated Creatinine Clearance: 55.1 mL/min (A) (by C-G formula based on SCr of 1.31 mg/dL (H)). Liver Function Tests: Recent Labs  Lab 01/16/21 1122  AST 28  ALT 30  ALKPHOS 61  BILITOT 1.8*  PROT 7.4  ALBUMIN 4.0   Recent Labs  Lab 01/16/21 1122  LIPASE 30   No results for input(s): AMMONIA in the last 168 hours. Coagulation Profile: No results for input(s): INR, PROTIME in the last 168 hours. Cardiac Enzymes: No results for input(s): CKTOTAL, CKMB, CKMBINDEX, TROPONINI in the last 168 hours. BNP (last 3 results) No results for input(s): PROBNP in the last 8760 hours. HbA1C: No results for input(s): HGBA1C in the last 72 hours. CBG: No results for input(s): GLUCAP in the last 168 hours. Lipid Profile: No results for input(s): CHOL, HDL, LDLCALC, TRIG, CHOLHDL, LDLDIRECT in the last 72 hours. Thyroid Function Tests: No results for input(s): TSH, T4TOTAL, FREET4, T3FREE, THYROIDAB in the last 72 hours. Anemia Panel: No results for input(s): VITAMINB12, FOLATE, FERRITIN, TIBC, IRON, RETICCTPCT in the last 72 hours. Urine analysis:    Component Value Date/Time   COLORURINE YELLOW 01/16/2021 1122   APPEARANCEUR CLEAR 01/16/2021 1122   LABSPEC 1.010 01/16/2021 1122   PHURINE 7.5 01/16/2021 1122   GLUCOSEU NEGATIVE 01/16/2021 1122   GLUCOSEU NEGATIVE 05/10/2016 1328   HGBUR NEGATIVE 01/16/2021 1122   [place] 01/16/2021 1122   BILIRUBINUR neg 04/21/2016 [place] 01/16/2021 1122   PROTEINUR NEGATIVE 01/16/2021 1122   UROBILINOGEN 0.2 05/10/2016 1328   NITRITE NEGATIVE 01/16/2021 1122   LEUKOCYTESUR NEGATIVE 01/16/2021 1122    Radiological Exams on Admission - Personally Reviewed: CT Head Wo Contrast  Result Date: 01/16/2021 CLINICAL DATA:  Neuro deficit. Acute  change. Stroke suspected. History of stroke with RIGHT-sided symptoms in the past. EXAM: CT HEAD WITHOUT CONTRAST TECHNIQUE: Contiguous axial images were obtained from the base  of the skull through the vertex without intravenous contrast. COMPARISON:  06/20/2016 FINDINGS: Brain: No evidence of acute infarction, hemorrhage, hydrocephalus, extra-axial collection or mass lesion/mass effect. Vascular: There is minimal atherosclerotic calcification of the internal carotid arteries. Skull: Normal. Negative for fracture or focal lesion. Sinuses/Orbits: No acute finding. Other: None. IMPRESSION: No evidence for acute intracranial abnormality. Electronically Signed   By: Nolon Nations M.D.   On: 01/16/2021 11:58   MR BRAIN WO CONTRAST  Result Date: 01/16/2021 CLINICAL DATA:  Neuro deficit, acute, stroke suspected Prior stroke, 1 day of right-sided numbness and sensation difference on hemibody. EXAM: MRI HEAD WITHOUT CONTRAST TECHNIQUE: Multiplanar, multiecho pulse sequences of the brain and surrounding structures were obtained without intravenous contrast. COMPARISON:  01/16/2021 and prior. FINDINGS: Brain: Scattered small acute infarcts involving the left frontoparietal region. Remote left thalamic insult with chronic hemosiderin deposition. No acute intracranial hemorrhage. No midline shift, ventriculomegaly or extra-axial fluid collection. No mass lesion. Mild cerebral atrophy with ex vacuo dilatation. Moderate chronic microvascular ischemic changes. Chronic right basal ganglia lacunar insult. Vascular: Major intracranial flow voids are proximally preserved. Skull and upper cervical spine: Normal marrow signal. Sinuses/Orbits: Normal orbits. Clear paranasal sinuses. No mastoid effusion. Other: None. IMPRESSION: Small multifocal acute infarcts involving the left frontoparietal region. Remote left thalamic insult with hemosiderin deposition. Chronic right basal ganglia lacunar insult. Moderate chronic microvascular  ischemic changes. Electronically Signed   By: Primitivo Gauze M.D.   On: 01/16/2021 18:36    EKG: Personally reviewed.  Rhythm is atrial flutter with variable AV block with heart rate of 113 bpm.  Evidence of right bundle branch block,  no dynamic ST segment changes appreciated.  Assessment/Plan Principal Problem:   Acute arterial ischemic stroke, multifocal, anterior circulation, left [facility])   Patient presenting with sudden onset right-sided tingling numbness  MRI brain performed upon arrival to [facility] emergency department revealing multiple small acute infarcts of the left frontoparietal region concerning for cardioembolic strokes  Case was discussed with Dr. Malen Gauze while patient was at [facility] and according to the emergency department provider Dr. Curly Shores will follow up with a formal consultation -neurology assistance is appreciated.  Patient is already on Eliquis twice daily and therefore this will not be modified at this time.  Risk of hemorrhagic conversion is felt to be quite low considering size of infarcts.   Patient has a family history of Crestor allergy.  Will obtain lipid panel and if LDL greater than 70 will initiate alternative statin  Serial neurologic checks  Obtaining echocardiogram in the morning  Monitoring patient on telemetry  PT, OT, SLP evaluation  Permissive hypertension with exception of metoprolol for rate control.  Active Problems:   Atrial flutter with rapid ventricular response (HCC)   Patient reports sensation of mild shortness of breath and rapid heart rate over the past several days  Here in the emergency department patient is currently in rapid atrial flutter with heart rates in the 1 teens  Patient is typically on Toprol-XL 25 mg daily.  Will instead place patient on low-dose intravenous metoprolol 2.5 mg every 6 for now to attempt to achieve rate control.  Echocardiogram in the morning  Monitoring patient on  telemetry  Correcting hypokalemia check magnesium levels  Obtaining TSH    Essential hypertension   Permissive hypertension for now with the exception of scheduled low-dose intravenous metoprolol for rate control  Will only provide as needed intravenous antihypertensives for markedly elevated blood pressures.    Diuretic-induced hypokalemia  Holding home regimen of chlorthalidone  Replacing with oral potassium chloride  Obtaining magnesium level    Lumbar radiculopathy    .As needed analgesics for chronic low back pain    Mixed hyperlipidemia    Patient is a documented history of allergy to Crestor  No statins noted on home medication reconciliation which is yet to be verified by pharmacy  Obtaining lipid panel, if LDL greater than 70 will initiate alternative statin therapy.   Code Status:  Full code Family Communication: deferred   Status is: Observation  The patient remains OBS appropriate and will d/c before 2 midnights.  Dispo: The patient is from: Home              Anticipated d/c is to: Home              Patient currently is not medically stable to d/c.   Difficult to place patient No        Vernelle Emerald MD [facility] Pager [phone]  If 7PM-7AM, please contact night-coverage [email or web address] Use universal [place] password for that web site. If you do not have the password, please call the hospital operator.  01/16/2021, 8:21 PM

## 2021-01-16 NOTE — Consult Note (Signed)
Neurology Consultation Reason for Consult: Right sided numbness/tingling Requesting Physician: Inda Merlin  CC: Right-sided numbness  History is obtained from: Patient and chart review  HPI: Patrick Roth is a 75 y.o. male with a past medical history significant for atrial flutter (on Eliquis), hypertension, hyperlipidemia, sciatica, prior stroke with right-sided weakness (minimal residual shoulder weakness).  He reports he has been in his usual state of health although he has noticed that his heart rates have been asymptomatically elevated into the 140s, with blood pressure narrow pulse pressure of 120/110s.  After he took a shower on 2/20 3 in the evening he noticed some numbness of the right leg from the knee down.  When he woke in the morning he noticed he was numb in several areas of the right side of his body.  Most of this is resolved other than some residual numbness in the back of the head on the right side as well as his trunk.  He denies any other focal neurologic symptoms, cardiac symptoms, bleeding issues.  He reports he is active around his yard (lives [place] acres and manages it including cutting trees etc. himself).  He reports he is adherent to all of his medications and has not missed any doses.  He reports he is intolerant to statins due to shortness of breath.  He is unsure whether this was severe enough to require emergency evaluation although he does note requiring emergency evaluation for an allergic reaction to some prior medication.  On chart review he has consistently reported shortness of breath as his allergy to statins, and he did not tolerate Zetia either.  LKW: 2/24 evening tPA given?: No, due to out of the window and on anticoagulation Premorbid modified rankin scale:      1 - No significant disability. Able to carry out all usual activities, despite some symptoms (mild residual right-sided shoulder weakness)  ROS: All other review of systems was negative except  as noted in the HPI.   Past Medical History:  Diagnosis Date  . Atrial flutter ([place])   . Colon polyps   . Constipation 05/27/2015  . DOE (dyspnea on exertion) 10/13/2015  . Faintness 06/20/2016  . History of chicken pox   . Hyperlipemia 02/13/2016  . Hyperlipidemia   . Hypertension   . Hypokalemia 06/28/2016  . Hyponatremia 06/28/2016  . Internal hemorrhoids   . Irregular heartbeat   . Malignant hypertension 07/15/2015  . Sciatica   . Stroke [facility]) 2008  . Syncope and collapse 06/28/2016   Past Surgical History:  Procedure Laterality Date  . CARDIOVERSION N/A 09/02/2017   Procedure: CARDIOVERSION;  Surgeon: [name], MD;  Location: [facility] ENDOSCOPY;  Service: Cardiovascular;  Laterality: N/A;  . COLONOSCOPY  2011  . great toe surgery    . PROSTATE BIOPSY     Family History  Problem Relation Age of Onset  . Hypertension Father 70       Deceased  . Heart disease Father   . Lung cancer Mother 22       Deceased  . Healthy Sister        x3  . Healthy Brother        x4  . Heart disease Brother        #5  . Other Daughter        Alpha Thalassemia  . Lupus Daughter        #2-deceased  . Diabetes Neg Hx   . Heart attack Neg Hx   .  Hyperlipidemia Neg Hx   . Sudden death Neg Hx   Elevated hemoglobin - father   Current Facility-Administered Medications:  .   stroke: mapping our early stages of recovery book, , Does not apply, Once, Shalhoub, Sherryll Burger, MD .  acetaminophen (TYLENOL) tablet 650 mg, 650 mg, Oral, Q4H PRN **OR** acetaminophen (TYLENOL) 160 MG/5ML solution 650 mg, 650 mg, Per Tube, Q4H PRN **OR** acetaminophen (TYLENOL) suppository 650 mg, 650 mg, Rectal, Q4H PRN, Shalhoub, Sherryll Burger, MD .  apixaban (ELIQUIS) tablet 5 mg, 5 mg, Oral, BID, Shalhoub, Sherryll Burger, MD .  lactated ringers infusion, , Intravenous, Continuous, Shalhoub, Sherryll Burger, MD .  metoprolol tartrate (LOPRESSOR) injection 2.5 mg, 2.5 mg, Intravenous, Q6H, Shalhoub, Sherryll Burger, MD, 2.5 mg at 01/16/21 2055 .   ondansetron (ZOFRAN) injection 4 mg, 4 mg, Intravenous, Q6H PRN, Shalhoub, Sherryll Burger, MD .  oxyCODONE-acetaminophen (PERCOCET/ROXICET) 5-325 MG per tablet 1 tablet, 1 tablet, Oral, Q6H PRN, Shalhoub, Sherryll Burger, MD .  polyethylene glycol (MIRALAX / GLYCOLAX) packet 17 g, 17 g, Oral, Daily PRN, Shalhoub, Sherryll Burger, MD .  potassium chloride SA (KLOR-CON) CR tablet 40 mEq, 40 mEq, Oral, Once, Shalhoub, Sherryll Burger, MD  Current Outpatient Medications:  .  acetaminophen (TYLENOL) 650 MG CR tablet, Take 650 mg by mouth every 8 (eight) hours as needed for pain., Disp: , Rfl:  .  amLODipine (NORVASC) 10 MG tablet, Take 10 mg by mouth daily., Disp: , Rfl:  .  chlorthalidone (HYGROTON) 25 MG tablet, Take 1 tablet by mouth once daily, Disp: 90 tablet, Rfl: 0 .  ELIQUIS 5 MG TABS tablet, Take 1 tablet by mouth twice daily, Disp: 60 tablet, Rfl: 5 .  gabapentin (NEURONTIN) 100 MG capsule, Take 1 capsule by mouth 4 times daily, Disp: 360 capsule, Rfl: 0 .  metoprolol succinate (TOPROL XL) 25 MG 24 hr tablet, Take 1 tablet (25 mg total) by mouth daily., Disp: 90 tablet, Rfl: 3 .  oxyCODONE-acetaminophen (PERCOCET/ROXICET) 5-325 MG tablet, Take 1 tablet by mouth every 4 (four) hours as needed for severe pain., Disp: 12 tablet, Rfl: 0 .  potassium chloride SA (KLOR-CON) 20 MEQ tablet, TAKE 1 TABLET BY MOUTH IN THE MORNING AND 2 IN THE EVENING, Disp: 270 tablet, Rfl: 1 .  psyllium (REGULOID) 0.52 g capsule, Take by mouth., Disp: , Rfl:    Social History:  reports that he has never smoked. He has never used smokeless tobacco. He reports that he does not drink alcohol and does not use drugs.  Exam: Current vital signs: BP (!) 122/104   Pulse (!) 109   Temp 97.6 F (36.4 C) (Oral)   Resp 15   SpO2 100%  Vital signs in last 24 hours: Temp:  [97.6 F (36.4 C)-98 F (36.7 C)] 97.6 F (36.4 C) (02/25 1634) Pulse Rate:  [108-130] 109 (02/25 1901) Resp:  [15-24] 15 (02/25 1901) BP: (122-154)/(95-110) 122/104 (02/25  1901) SpO2:  [96 %-100 %] 100 % (02/25 1901)   Physical Exam  Constitutional: Appears well-developed and well-nourished.  Psych: Affect appropriate to situation, calm and cooperative Eyes: No scleral injection HENT: No oropharyngeal obstruction.  MSK: no joint deformities.  Cardiovascular: Normal rate and regular rhythm.  Respiratory: Effort normal, non-labored breathing GI: Soft.  No distension. There is no tenderness.  Skin: Warm dry and intact visible skin  Neuro: Mental Status: Patient is awake, alert, oriented to person, place, month, year, and situation. Patient is able to give a clear and coherent history. No signs  of aphasia or neglect Cranial Nerves: II: Visual Fields are full. Pupils are equal, round, and reactive to light.   III,IV, VI: EOMI without ptosis or diploplia.  V: Facial sensation is symmetric to temperature VII: Facial movement is symmetric.  VIII: hearing is intact to voice X: Uvula elevates symmetrically XI: Shoulder shrug is symmetric. XII: tongue is midline without atrophy or fasciculations.  Motor: Tone is normal. Bulk is normal. 5/5 strength was present in all four extremities Sensory: Sensation is symmetric to light touch and temperature in the arms and legs; reduced in a small area of the back of the head on the right, as well as the right trunk Deep Tendon Reflexes: 2+ and symmetric in the biceps and patellae.  Plantars: Toes are downgoing bilaterally.  Cerebellar: Finger-nose is notable for some mild dysmetria with the left hand at end reach Gait: Able to rise on heels and toes. Mildly unsteady tandem  NIHSS total 1 Score breakdown: 1 point for sensory loss on the right side    I have reviewed labs in epic and the results pertinent to this consultation are: Creatinine 1.3, GFR 57 Potassium mildly low at 3.2 CBC increased hemoglobin at 17.6, baseline  Lab Results  Component Value Date   HGBA1C 5.8 10/23/2019   Lab Results   Component Value Date   CHOL 212 (H) 10/27/2020   HDL 48.40 10/27/2020   LDLCALC 145 (H) 10/27/2020   TRIG 94.0 10/27/2020   CHOLHDL 4 10/27/2020     I have personally reviewed the images obtained: Head CT without acute intracranial process MRI brain with scattered punctate infarcts in the left hemisphere as detailed in radiology report CTA with no clinically significant stenosis, incidentally left vertebral is small and ends in PICA and the right vertebral is dominant. There is some scattered atherosclerosis  Impression: This is a 75 year old man his vascular risk factors include atrial flutter on anticoagulation, hypertension, hyperlipidemia, presenting with acute right-sided sensory symptoms which matches well the left sensory cortical lesion on MRI.  MRI additionally revealed scattered punctate infarcts in the left hemisphere consistent with a cardioembolic pattern.  He has minimal atherosclerosis so do not suspect an atheroembolic etiology.  However he does have elevated hemoglobin which is chronic since at least 2018.  This can be contributing to stroke risk due to hyperviscosity.    Recommendations: # Scattered multifocal strokes in the left MCA territory, most likely cardioembolic - Stroke labs [id number], fasting lipid panel, ordered, goal A1c less than 7, LDL less than 70 - MRI brain, completed as above - CTA, completed as above - Frequent neuro checks - Echocardiogram, ordered - Antiplatelet only if cardiac indication (such as CAD) given patient is on Eliquis for atrial flutter  -Given small size of stroke and the fact that it has been 24 hours since symptom onset, okay to resume anticoagulation - Given allergy to statin, recommend PSK9 inhibitor on an outpatient basis - Risk factor modification - Telemetry monitoring while inpatient - Blood pressure goal   - Normotension - PT consult, OT consult unnecessary since only sensory symptoms  - Stroke team to follow  #  Polycythemia While this does appear chronic since 2018, the patient reports a family history of elevated hemoglobin levels in his father as well, and this could be contributing to hyperviscosity and stroke risk -Peripheral blood smear -Repeat CBC in the morning after 1 L NS bolus today -JAK2 testing -CXR -Further evaluation per primary team or outpatient provider  Lesleigh Noe MD-PhD [facility] [phone] Available 7 PM to 7 AM, outside of these hours please call Neurologist on call as listed on Amion.

## 2021-01-16 NOTE — ED Triage Notes (Signed)
Pt brought by ems for MRI screen. [facility]. Having numbness to right side x1 day. Pt states its his entire right side but more so on his head and abd. Denies any other symptoms at this time. Pt A&OX4.

## 2021-01-16 NOTE — ED Triage Notes (Addendum)
Right side body numbness from head to ankle per pt x 1 day. Hx HTn. No neuro deficit. Ambulatory to room with steady gait. Reports RLQ swelling. Last BM 2 days ago. denies headache nor dizziness. Alert and oriented x 4. Hx stroke 12 yrs ago . Added been lifting heavy object while working in his yard.

## 2021-01-16 NOTE — ED Provider Notes (Signed)
[facility] EMERGENCY DEPARTMENT Provider Note   CSN: [id number] Arrival date & time: 01/16/21  1011     History Chief Complaint  Patient presents with  . Numbness    Right side body    Patrick Roth is a 75 y.o. male.  The history is provided by the patient and medical records. No language interpreter was used.  Neurologic Problem This is a new problem. The current episode started yesterday. The problem occurs constantly. The problem has not changed since onset.Associated symptoms include abdominal pain (resolved r lateral discomfort). Pertinent negatives include no chest pain, no headaches and no shortness of breath. Nothing aggravates the symptoms. Nothing relieves the symptoms. He has tried nothing for the symptoms. The treatment provided no relief.       Past Medical History:  Diagnosis Date  . Atrial flutter ([place])   . Colon polyps   . Constipation 05/27/2015  . DOE (dyspnea on exertion) 10/13/2015  . Faintness 06/20/2016  . History of chicken pox   . Hyperlipemia 02/13/2016  . Hyperlipidemia   . Hypertension   . Hypokalemia 06/28/2016  . Hyponatremia 06/28/2016  . Internal hemorrhoids   . Irregular heartbeat   . Malignant hypertension 07/15/2015  . Sciatica   . Stroke [facility]) 2008  . Syncope and collapse 06/28/2016    Patient Active Problem List   Diagnosis Date Noted  . Secondary hypercoagulable state ([place]) 09/11/2020  . Cyst of perineum in male 11/28/2018  . Hyperlipidemia   . History of chicken pox   . Colon polyps   . Atypical atrial flutter ([place])   . Other intervertebral disc degeneration, lumbar region 05/09/2017  . Lumbar radiculopathy, chronic 03/29/2017  . Persistent atrial fibrillation ([place]) 09/14/2016  . Essential hypertension 05/27/2015  . Mallet deformity of fourth finger, right 07/11/2013  . Stroke [facility]) 08/23/2012    Past Surgical History:  Procedure Laterality Date  . CARDIOVERSION N/A 09/02/2017   Procedure: CARDIOVERSION;  Surgeon:  [name], MD;  Location: [facility] ENDOSCOPY;  Service: Cardiovascular;  Laterality: N/A;  . COLONOSCOPY  2011  . great toe surgery    . PROSTATE BIOPSY         Family History  Problem Relation Age of Onset  . Hypertension Father 77       Deceased  . Heart disease Father   . Lung cancer Mother 55       Deceased  . Healthy Sister        x3  . Healthy Brother        x4  . Heart disease Brother        #5  . Other Daughter        Alpha Thalassemia  . Lupus Daughter        #2-deceased  . Diabetes Neg Hx   . Heart attack Neg Hx   . Hyperlipidemia Neg Hx   . Sudden death Neg Hx     Social History   Tobacco Use  . Smoking status: Never Smoker  . Smokeless tobacco: Never Used  Vaping Use  . Vaping Use: Never used  Substance Use Topics  . Alcohol use: No  . Drug use: No    Home Medications Prior to Admission medications   Medication Sig Start Date End Date Taking? Authorizing Provider  acetaminophen (TYLENOL) 650 MG CR tablet Take 650 mg by mouth every 8 (eight) hours as needed for pain.    [provider]  amLODipine (NORVASC) 10 MG tablet Take  10 mg by mouth daily.    [provider]  chlorthalidone (HYGROTON) 25 MG tablet Take 1 tablet by mouth once daily 12/31/20   Midge Minium, MD  ELIQUIS 5 MG TABS tablet Take 1 tablet by mouth twice daily 08/11/20   Camnitz, Ocie Doyne, MD  gabapentin (NEURONTIN) 100 MG capsule Take 1 capsule by mouth 4 times daily 09/12/20   Brunetta Jeans, PA-C  metoprolol succinate (TOPROL XL) 25 MG 24 hr tablet Take 1 tablet (25 mg total) by mouth daily. 01/07/21 01/07/22  Shirley Friar, PA-C  oxyCODONE-acetaminophen (PERCOCET/ROXICET) 5-325 MG tablet Take 1 tablet by mouth every 4 (four) hours as needed for severe pain. 01/24/20   Malvin Johns, MD  potassium chloride SA (KLOR-CON) 20 MEQ tablet TAKE 1 TABLET BY MOUTH IN THE MORNING AND 2 IN THE EVENING 08/08/20   Brunetta Jeans, PA-C  psyllium (REGULOID)  0.52 g capsule Take by mouth.    [provider]    Allergies    Crestor [rosuvastatin calcium], Spironolactone, Terazosin, Amiodarone, Atenolol, Bystolic [nebivolol hcl], Hydralazine, Tizanidine, Amlodipine, Clonidine, Hydrocodone-acetaminophen, Levofloxacin, Losartan, Maxzide [hydrochlorothiazide w-triamterene], Olmesartan, Tape, and Tussionex pennkinetic er [hydrocod polst-cpm polst er]  Review of Systems   Review of Systems  Constitutional: Negative for chills, diaphoresis, fatigue and fever.  HENT: Negative for congestion.   Eyes: Negative for visual disturbance.  Respiratory: Negative for cough, chest tightness, shortness of breath and wheezing.   Cardiovascular: Negative for chest pain and palpitations.  Gastrointestinal: Positive for abdominal pain (resolved r lateral discomfort). Negative for constipation, diarrhea, nausea and vomiting.  Genitourinary: Positive for flank pain. Negative for dysuria and frequency.  Musculoskeletal: Negative for back pain, neck pain and neck stiffness.  Neurological: Positive for numbness. Negative for dizziness, seizures, speech difficulty, weakness, light-headedness and headaches.  Psychiatric/Behavioral: Negative for agitation and confusion.  All other systems reviewed and are negative.   Physical Exam Updated Vital Signs BP (!) 154/100 (BP Location: Right Arm)   Pulse (!) 130   Temp 97.9 F (36.6 C) (Oral)   Resp 18   SpO2 96%   Physical Exam Vitals and nursing note reviewed.  Constitutional:      General: He is not in acute distress.    Appearance: He is well-developed and well-nourished. He is not ill-appearing, toxic-appearing or diaphoretic.  HENT:     Head: Normocephalic and atraumatic.     Nose: No congestion or rhinorrhea.     Mouth/Throat:     Mouth: Mucous membranes are moist.     Pharynx: No oropharyngeal exudate or posterior oropharyngeal erythema.  Eyes:     Conjunctiva/sclera: Conjunctivae normal.   Cardiovascular:     Rate and Rhythm: Normal rate and regular rhythm.     Pulses: Normal pulses.     Heart sounds: No murmur heard.   Pulmonary:     Effort: Pulmonary effort is normal. No respiratory distress.     Breath sounds: Normal breath sounds. No wheezing, rhonchi or rales.  Chest:     Chest wall: No tenderness.  Abdominal:     General: Abdomen is flat.     Palpations: Abdomen is soft.     Tenderness: There is no abdominal tenderness. There is no right CVA tenderness, left CVA tenderness, guarding or rebound.     Comments: More firm sensation on the right lateral abdomen and right flank area are consistent with muscle spasm.  Musculoskeletal:        General: No tenderness or edema.  Cervical back: Neck supple. No tenderness.     Right lower leg: No edema.     Left lower leg: No edema.  Skin:    General: Skin is warm and dry.     Capillary Refill: Capillary refill takes less than 2 seconds.     Findings: No erythema.  Neurological:     Mental Status: He is alert and oriented to person, place, and time.     Cranial Nerves: No cranial nerve deficit.     Sensory: Sensory deficit present.     Motor: No weakness.     Coordination: Coordination normal.  Psychiatric:        Mood and Affect: Mood and affect and mood normal.     ED Results / Procedures / Treatments   Labs (all labs ordered are listed, but only abnormal results are displayed) Labs Reviewed  CBC WITH DIFFERENTIAL/PLATELET - Abnormal; Notable for the following components:      Result Value   RBC 5.95 (*)    Hemoglobin 17.6 (*)    HCT 53.1 (*)    All other components within normal limits  COMPREHENSIVE METABOLIC PANEL - Abnormal; Notable for the following components:   Potassium 3.2 (*)    Creatinine, Ser 1.31 (*)    Total Bilirubin 1.8 (*)    GFR, Estimated 57 (*)    All other components within normal limits  URINE CULTURE  LIPASE, BLOOD  URINALYSIS, ROUTINE W REFLEX MICROSCOPIC    EKG EKG  Interpretation  Date/Time:  Friday January 16 2021 10:22:28 EST Ventricular Rate:  130 PR Interval:    QRS Duration: 153 QT Interval:  368 QTC Calculation: 542 R Axis:   -44 Text Interpretation: Junctional tachycardia RBBB and LAFB When compared to prior, now junctional tachycardia vs sinus tacycardia with prior PR prolongation. PVC present. No STEMI Confirmed by Antony Blackbird [phone]) on 01/16/2021 10:40:15 AM   Radiology CT Head Wo Contrast  Result Date: 01/16/2021 CLINICAL DATA:  Neuro deficit. Acute change. Stroke suspected. History of stroke with RIGHT-sided symptoms in the past. EXAM: CT HEAD WITHOUT CONTRAST TECHNIQUE: Contiguous axial images were obtained from the base of the skull through the vertex without intravenous contrast. COMPARISON:  06/20/2016 FINDINGS: Brain: No evidence of acute infarction, hemorrhage, hydrocephalus, extra-axial collection or mass lesion/mass effect. Vascular: There is minimal atherosclerotic calcification of the internal carotid arteries. Skull: Normal. Negative for fracture or focal lesion. Sinuses/Orbits: No acute finding. Other: None. IMPRESSION: No evidence for acute intracranial abnormality. Electronically Signed   By: Nolon Nations M.D.   On: 01/16/2021 11:58    Procedures Procedures     Medications Ordered in ED Medications  potassium chloride SA (KLOR-CON) CR tablet 40 mEq (has no administration in time range)    ED Course  I have reviewed the triage vital signs and the nursing notes.  Pertinent labs & imaging results that were available during my care of the patient were reviewed by me and considered in my medical decision making (see chart for details).    MDM Rules/Calculators/A&P                          Patrick Roth is a 75 y.o. male with a past medical history significant for prior stroke, chronic atrial fib/flutter on Eliquis, hypertension, hyperlipidemia, irregular heart rate, and chronic sciatic problems with chronic back  pain who presents with 2 concerns, right flank/abdominal pain with spasm sensation and right hemibody sensory difference.  Patient reports that several days ago, patient was working in the yard and thought he may have pulled a muscle on his right abdomen.  He reports it feels like it is swollen and spasming intense on the right side from the back to his abdomen.  He denies nausea, vomiting, or other abdominal pain.  He denies any swelling in his groin or his inguinal area.  Denies history of hernias.  Denies any new leg pain or leg swelling.  He denies any chest pain, palpitations, shortness of breath.  He reports that he took an oxycodone and was feeling better in regards to the discomfort but it still feels like it is having some spasms.  He says that that was doing better but yesterday, noticed while combing his hair, his right face felt numb.  He says that he did not examine the rest of his body and felt that his right hemibody was numb compared to the left and had difference in temperature sensation.  He reports that his previous stroke had a right hemibody symptom of numbness and weakness and was related to a hemorrhagic stroke.  He does say that last time he did not have any headache with his hemorrhagic stroke.  He currently denies any weakness or difficulty with ambulation.  He says that the numb sensation has been constant and the temperature sensation feels most pronounced in his torso.  He denies any double vision or blurry vision.  Denies any speech difficulties or other complaints. denies any trauma.  On exam, lungs are clear and chest is nontender.  Abdomen is nontender.  Patient does have some muscle spasm and tense feeling on his right flank area but normal bowel sounds were appreciated.  No CVA tenderness or back tenderness.  Normal sensation and strength in legs.  Normal sensation and strength in arms.  Normal finger-nose-finger testing.  Pupil symmetric and reactive normal extraocular  movements.  Clear speech.  Normal visual fields.  Normal extraocular movements.  No neck stiffness or neck tenderness.  Symmetric smile.  He had reported difference in the temperature sensation in his abdomen from 1 side to the other worse on the right compared to left but he did not say he had temperature sensation difference otherwise.  He reports he still feeling a numbness sensation in his whole right side but thought light touch felt the same.  Given his history of prior stroke with immediate right-sided symptoms, will get a CT head to look for recurrent bleed.  We discussed that we will likely touch base with neurology to decide if he needs MRI today given these persistent symptoms and distribution similar to prior stroke.  We will get some screening labs to look for concerning findings related to his abdomen however with his lack of tenderness and the palpable muscle spasms I suspect this is more musculoskeletal.  Will hold on further advanced imaging of his abdomen at this time.  Family agrees with plan of care, anticipate reassessment  2:24 PM CT head shows no acute abnormality.  Patient does have mild hypokalemia, will replete with potassium.  Other work-up was overall reassuring.  I spoke with Dr. Cheral Marker with neurology on call and they recommend he get MRI today of the brain to look for acute stroke.  If no stroke is seen, suspect patient may be stable for discharge home and outpatient follow-up although TIA is still considered given the waxing and waning symptoms today.  In regards to the right-sided torso discomfort, with his palpable  muscle spasm, I do suspect this is a muscle spasm and pull from the effort outside in the yard several days ago.  His labs did not show evidence of acute liver or pancreatic dysfunction, doubt gallbladder abnormality.  Urinalysis does not show infection, doubt kidney stone or UTI.  Suspect this is all soft tissue troubles.  As his symptoms had improved from this  front, feel he is appropriate for PCP follow-up for this.  I spoke with Dr. Dewaine Conger about transfer ED to ED and he was accepted.  Patient will get MRI and then likely touch base with neurology to discuss disposition.    Final Clinical Impression(s) / ED Diagnoses Final diagnoses:  Right sided numbness  Loss of perception for temperature  Spasm of abdominal muscles of right side    Clinical Impression: 1. Right sided numbness   2. Loss of perception for temperature   3. Spasm of abdominal muscles of right side     Disposition: Admit  This note was prepared with assistance of Dragon voice recognition software. Occasional wrong-word or sound-a-like substitutions may have occurred due to the inherent limitations of voice recognition software.      Snigdha Howser, Gwenyth Allegra, MD 01/16/21 [phone]

## 2021-01-17 ENCOUNTER — Other Ambulatory Visit: Payer: Self-pay

## 2021-01-17 ENCOUNTER — Observation Stay (HOSPITAL_BASED_OUTPATIENT_CLINIC_OR_DEPARTMENT_OTHER): Payer: Medicare HMO

## 2021-01-17 DIAGNOSIS — I6930 Unspecified sequelae of cerebral infarction: Secondary | ICD-10-CM

## 2021-01-17 DIAGNOSIS — I4892 Unspecified atrial flutter: Secondary | ICD-10-CM

## 2021-01-17 DIAGNOSIS — I10 Essential (primary) hypertension: Secondary | ICD-10-CM | POA: Diagnosis not present

## 2021-01-17 DIAGNOSIS — E782 Mixed hyperlipidemia: Secondary | ICD-10-CM | POA: Diagnosis not present

## 2021-01-17 DIAGNOSIS — I63522 Cerebral infarction due to unspecified occlusion or stenosis of left anterior cerebral artery: Secondary | ICD-10-CM | POA: Diagnosis not present

## 2021-01-17 DIAGNOSIS — I639 Cerebral infarction, unspecified: Secondary | ICD-10-CM | POA: Diagnosis not present

## 2021-01-17 DIAGNOSIS — Z7901 Long term (current) use of anticoagulants: Secondary | ICD-10-CM | POA: Diagnosis not present

## 2021-01-17 DIAGNOSIS — Z79899 Other long term (current) drug therapy: Secondary | ICD-10-CM | POA: Diagnosis not present

## 2021-01-17 DIAGNOSIS — Z20822 Contact with and (suspected) exposure to covid-19: Secondary | ICD-10-CM | POA: Diagnosis not present

## 2021-01-17 DIAGNOSIS — R200 Anesthesia of skin: Secondary | ICD-10-CM

## 2021-01-17 LAB — COMPREHENSIVE METABOLIC PANEL
ALT: 30 U/L (ref 0–44)
AST: 28 U/L (ref 15–41)
Albumin: 3.9 g/dL (ref 3.5–5.0)
Alkaline Phosphatase: 71 U/L (ref 38–126)
Anion gap: 10 (ref 5–15)
BUN: 12 mg/dL (ref 8–23)
CO2: 27 mmol/L (ref 22–32)
Calcium: 9.6 mg/dL (ref 8.9–10.3)
Chloride: 100 mmol/L (ref 98–111)
Creatinine, Ser: 1.38 mg/dL — ABNORMAL HIGH (ref 0.61–1.24)
GFR, Estimated: 53 mL/min — ABNORMAL LOW (ref >60)
Glucose, Bld: 92 mg/dL (ref 70–99)
Potassium: 3.9 mmol/L (ref 3.5–5.1)
Sodium: 137 mmol/L (ref 135–145)
Total Bilirubin: 3.2 mg/dL — ABNORMAL HIGH (ref 0.3–1.2)
Total Protein: 7.6 g/dL (ref 6.5–8.1)

## 2021-01-17 LAB — CBC WITH DIFFERENTIAL/PLATELET
Abs Immature Granulocytes: 0.03 10*3/uL (ref 0.00–0.07)
Basophils Absolute: 0 10*3/uL (ref 0.0–0.1)
Basophils Relative: 0 %
Eosinophils Absolute: 0 10*3/uL (ref 0.0–0.5)
Eosinophils Relative: 1 %
HCT: 57.3 % — ABNORMAL HIGH (ref 39.0–52.0)
Hemoglobin: 18.7 g/dL — ABNORMAL HIGH (ref 13.0–17.0)
Immature Granulocytes: 1 %
Lymphocytes Relative: 27 %
Lymphs Abs: 1.7 10*3/uL (ref 0.7–4.0)
MCH: 28.9 pg (ref 26.0–34.0)
MCHC: 32.6 g/dL (ref 30.0–36.0)
MCV: 88.4 fL (ref 80.0–100.0)
Monocytes Absolute: 0.7 10*3/uL (ref 0.1–1.0)
Monocytes Relative: 11 %
Neutro Abs: 4 10*3/uL (ref 1.7–7.7)
Neutrophils Relative %: 60 %
Platelets: 172 10*3/uL (ref 150–400)
RBC: 6.48 MIL/uL — ABNORMAL HIGH (ref 4.22–5.81)
RDW: 13.5 % (ref 11.5–15.5)
WBC: 6.5 10*3/uL (ref 4.0–10.5)
nRBC: 0 % (ref 0.0–0.2)

## 2021-01-17 LAB — HEMOGLOBIN A1C
Hgb A1c MFr Bld: 5.8 % — ABNORMAL HIGH (ref 4.8–5.6)
Mean Plasma Glucose: 119.76 mg/dL

## 2021-01-17 LAB — MAGNESIUM
Magnesium: 1.8 mg/dL (ref 1.7–2.4)
Magnesium: 1.9 mg/dL (ref 1.7–2.4)

## 2021-01-17 LAB — URINE CULTURE: Culture: NO GROWTH

## 2021-01-17 LAB — LIPID PANEL
Cholesterol: 271 mg/dL — ABNORMAL HIGH (ref 0–200)
HDL: 59 mg/dL (ref >40)
LDL Cholesterol: 201 mg/dL — ABNORMAL HIGH (ref 0–99)
Total CHOL/HDL Ratio: 4.6 RATIO
Triglycerides: 55 mg/dL (ref <150)
VLDL: 11 mg/dL (ref 0–40)

## 2021-01-17 LAB — SARS CORONAVIRUS 2 (TAT 6-24 HRS): SARS Coronavirus 2: NEGATIVE

## 2021-01-17 LAB — TSH: TSH: 3.801 u[IU]/mL (ref 0.350–4.500)

## 2021-01-17 LAB — ECHOCARDIOGRAM COMPLETE BUBBLE STUDY: S' Lateral: 3.3 cm

## 2021-01-17 MED ORDER — PERFLUTREN LIPID MICROSPHERE
1.0000 mL | INTRAVENOUS | Status: AC | PRN
  Administered 2021-01-17: 2 mL via INTRAVENOUS
  Filled 2021-01-17: qty 10

## 2021-01-17 MED ORDER — LACTATED RINGERS IV BOLUS
500.0000 mL | Freq: Once | INTRAVENOUS | Status: AC
  Administered 2021-01-17: 500 mL via INTRAVENOUS

## 2021-01-17 NOTE — Progress Notes (Signed)
OT Cancellation Note  Patient Details Name: Patrick Roth MRN: [id number] DOB: [birth year]   Cancelled Treatment:    Reason Eval/Treat Not Completed: OT screened, no needs identified, will sign off. Per chart review and conversation with pt, he appears to be at/close to baseline with ADLs.   [name], OT Acute Rehabilitation Services Pager: [phone] Office: [phone]  01/17/2021, 12:29 PM

## 2021-01-17 NOTE — Progress Notes (Signed)
Patient discharge in a chair at [place] pm, nurse escorted to the car, patient's spouse gave him a ride. Patient  is A&Ox4, denied of any discomfort, and stable.

## 2021-01-17 NOTE — Progress Notes (Addendum)
STROKE TEAM PROGRESS NOTE   INTERVAL HISTORY No acute events since arrival  Presented to [facility] with right sided numbness and tingling. Transferred to [facility] for further stroke work up.     Vitals:   01/17/21 0403 01/17/21 0410 01/17/21 0525 01/17/21 0540  BP: 97/69 97/69  99/73  Pulse: (!) 109     Resp: 18 20 17 17   Temp: 98.3 F (36.8 C)   98 F (36.7 C)  TempSrc: Oral   Oral  SpO2: 100% 100% 100% 100%  Weight:      Height:       CBC:  Recent Labs  Lab 01/16/21 1122 01/17/21 0036  WBC 6.2 6.5  NEUTROABS 3.3 4.0  HGB 17.6* 18.7*  HCT 53.1* 57.3*  MCV 89.2 88.4  PLT 178 751   Basic Metabolic Panel:  Recent Labs  Lab 01/16/21 1122 01/17/21 0036  NA 137 137  K 3.2* 3.9  CL 98 100  CO2 29 27  GLUCOSE 93 92  BUN 14 12  CREATININE 1.31* 1.38*  CALCIUM 9.0 9.6  MG  --  1.8  1.9   Lipid Panel:  Recent Labs  Lab 01/17/21 0036  CHOL 271*  TRIG 55  HDL 59  CHOLHDL 4.6  VLDL 11  LDLCALC 201*   HgbA1c:  Recent Labs  Lab 01/17/21 0036  HGBA1C 5.8*   Urine Drug Screen: No results for input(s): LABOPIA, COCAINSCRNUR, LABBENZ, AMPHETMU, THCU, LABBARB in the last 168 hours.  Alcohol Level No results for input(s): ETH in the last 168 hours.  IMAGING past 24 hours CT ANGIO HEAD W OR WO CONTRAST  Result Date: 01/16/2021 CLINICAL DATA:  Right-sided numbness over the last day. Acute infarctions seen in the left frontoparietal region. EXAM: CT ANGIOGRAPHY HEAD AND NECK TECHNIQUE: Multidetector CT imaging of the head and neck was performed using the standard protocol during bolus administration of intravenous contrast. Multiplanar CT image reconstructions and MIPs were obtained to evaluate the vascular anatomy. Carotid stenosis measurements (when applicable) are obtained utilizing NASCET criteria, using the distal internal carotid diameter as the denominator. CONTRAST:  29mL OMNIPAQUE IOHEXOL 350 MG/ML SOLN COMPARISON:  CT and MRI same day. FINDINGS: CTA NECK FINDINGS  Aortic arch: Mild aortic atherosclerosis. Branching pattern is normal without origin stenosis. Right carotid system: Common carotid artery widely patent to the bifurcation. Calcified plaque at the carotid bifurcation but no stenosis. Soft plaque in the ICA bulb region but no stenosis. Cervical ICA is tortuous but widely patent beyond that. Left carotid system: Common carotid artery widely patent to the bifurcation. Calcified plaque at the carotid bifurcation and ICA bulb but no stenosis. Cervical ICA is tortuous but widely patent beyond that. Vertebral arteries: Right vertebral artery is strongly dominant. Calcified plaque of the right subclavian artery and right vertebral artery origin with 30% origin stenosis. Wide patency beyond that. Tiny left vertebral artery is patent through the cervical region to the foramen magnum. Skeleton: Ordinary cervical spondylosis. Other neck: No mass or lymphadenopathy. Upper chest: Lung apices are clear. Review of the MIP images confirms the above findings CTA HEAD FINDINGS Anterior circulation: Both internal carotid arteries are patent through the skull base and siphon regions. There is ordinary siphon atherosclerotic calcification but no stenosis. The anterior and middle cerebral vessels are patent without proximal stenosis, aneurysm or vascular malformation. No large or medium vessel occlusion. Posterior circulation: Right vertebral artery is widely patent to the basilar. Tiny left vertebral artery terminates in PICA. No basilar stenosis. Posterior circulation  branch vessels are normal. Venous sinuses: Patent and normal. Anatomic variants: None significant. Review of the MIP images confirms the above findings IMPRESSION: 1. No intracranial large or medium vessel occlusion. 2. Mild atherosclerotic disease at both carotid bifurcation regions but without stenosis. No specific finding to explain the micro embolic infarctions in the left hemisphere. 3. 30% stenosis of the right  vertebral artery origin. Aortic Atherosclerosis (ICD10-I70.0). Electronically Signed   By: Nelson Chimes M.D.   On: 01/16/2021 20:58   CT Head Wo Contrast  Result Date: 01/16/2021 CLINICAL DATA:  Neuro deficit. Acute change. Stroke suspected. History of stroke with RIGHT-sided symptoms in the past. EXAM: CT HEAD WITHOUT CONTRAST TECHNIQUE: Contiguous axial images were obtained from the base of the skull through the vertex without intravenous contrast. COMPARISON:  06/20/2016 FINDINGS: Brain: No evidence of acute infarction, hemorrhage, hydrocephalus, extra-axial collection or mass lesion/mass effect. Vascular: There is minimal atherosclerotic calcification of the internal carotid arteries. Skull: Normal. Negative for fracture or focal lesion. Sinuses/Orbits: No acute finding. Other: None. IMPRESSION: No evidence for acute intracranial abnormality. Electronically Signed   By: Nolon Nations M.D.   On: 01/16/2021 11:58   CT ANGIO NECK W OR WO CONTRAST  Result Date: 01/16/2021 CLINICAL DATA:  Right-sided numbness over the last day. Acute infarctions seen in the left frontoparietal region. EXAM: CT ANGIOGRAPHY HEAD AND NECK TECHNIQUE: Multidetector CT imaging of the head and neck was performed using the standard protocol during bolus administration of intravenous contrast. Multiplanar CT image reconstructions and MIPs were obtained to evaluate the vascular anatomy. Carotid stenosis measurements (when applicable) are obtained utilizing NASCET criteria, using the distal internal carotid diameter as the denominator. CONTRAST:  78mL OMNIPAQUE IOHEXOL 350 MG/ML SOLN COMPARISON:  CT and MRI same day. FINDINGS: CTA NECK FINDINGS Aortic arch: Mild aortic atherosclerosis. Branching pattern is normal without origin stenosis. Right carotid system: Common carotid artery widely patent to the bifurcation. Calcified plaque at the carotid bifurcation but no stenosis. Soft plaque in the ICA bulb region but no stenosis. Cervical  ICA is tortuous but widely patent beyond that. Left carotid system: Common carotid artery widely patent to the bifurcation. Calcified plaque at the carotid bifurcation and ICA bulb but no stenosis. Cervical ICA is tortuous but widely patent beyond that. Vertebral arteries: Right vertebral artery is strongly dominant. Calcified plaque of the right subclavian artery and right vertebral artery origin with 30% origin stenosis. Wide patency beyond that. Tiny left vertebral artery is patent through the cervical region to the foramen magnum. Skeleton: Ordinary cervical spondylosis. Other neck: No mass or lymphadenopathy. Upper chest: Lung apices are clear. Review of the MIP images confirms the above findings CTA HEAD FINDINGS Anterior circulation: Both internal carotid arteries are patent through the skull base and siphon regions. There is ordinary siphon atherosclerotic calcification but no stenosis. The anterior and middle cerebral vessels are patent without proximal stenosis, aneurysm or vascular malformation. No large or medium vessel occlusion. Posterior circulation: Right vertebral artery is widely patent to the basilar. Tiny left vertebral artery terminates in PICA. No basilar stenosis. Posterior circulation branch vessels are normal. Venous sinuses: Patent and normal. Anatomic variants: None significant. Review of the MIP images confirms the above findings IMPRESSION: 1. No intracranial large or medium vessel occlusion. 2. Mild atherosclerotic disease at both carotid bifurcation regions but without stenosis. No specific finding to explain the micro embolic infarctions in the left hemisphere. 3. 30% stenosis of the right vertebral artery origin. Aortic Atherosclerosis (ICD10-I70.0). Electronically Signed  By: Nelson Chimes M.D.   On: 01/16/2021 20:58   MR BRAIN WO CONTRAST  Result Date: 01/16/2021 CLINICAL DATA:  Neuro deficit, acute, stroke suspected Prior stroke, 1 day of right-sided numbness and sensation  difference on hemibody. EXAM: MRI HEAD WITHOUT CONTRAST TECHNIQUE: Multiplanar, multiecho pulse sequences of the brain and surrounding structures were obtained without intravenous contrast. COMPARISON:  01/16/2021 and prior. FINDINGS: Brain: Scattered small acute infarcts involving the left frontoparietal region. Remote left thalamic insult with chronic hemosiderin deposition. No acute intracranial hemorrhage. No midline shift, ventriculomegaly or extra-axial fluid collection. No mass lesion. Mild cerebral atrophy with ex vacuo dilatation. Moderate chronic microvascular ischemic changes. Chronic right basal ganglia lacunar insult. Vascular: Major intracranial flow voids are proximally preserved. Skull and upper cervical spine: Normal marrow signal. Sinuses/Orbits: Normal orbits. Clear paranasal sinuses. No mastoid effusion. Other: None. IMPRESSION: Small multifocal acute infarcts involving the left frontoparietal region. Remote left thalamic insult with hemosiderin deposition. Chronic right basal ganglia lacunar insult. Moderate chronic microvascular ischemic changes. Electronically Signed   By: Primitivo Gauze M.D.   On: 01/16/2021 18:36   DG CHEST PORT 1 VIEW  Result Date: 01/16/2021 CLINICAL DATA:  Shortness of breath EXAM: PORTABLE CHEST 1 VIEW COMPARISON:  05/04/2017 FINDINGS: The heart size and mediastinal contours are within normal limits. Both lungs are clear. The visualized skeletal structures are unremarkable. IMPRESSION: No active disease. Electronically Signed   By: Donavan Foil M.D.   On: 01/16/2021 23:34   PHYSICAL EXAM Constitutional: Appears well-developed and well-nourished.  Psych: Affect appropriate to situation, calm and cooperative Eyes: No scleral injection HENT: No oropharyngeal obstruction.  MSK: no joint deformities.  Cardiovascular: Normal rate and regular rhythm.  Respiratory: Effort normal, non-labored breathing GI: Soft.  No distension. There is no tenderness.  Skin:  Warm dry and intact visible skin  Neuro: Mental Status: Patient is awake, alert, oriented to person, place, month, year, and situation. Patient is able to give a clear and coherent history. No signs of aphasia or neglect Cranial Nerves: II: Visual Fields are full. Pupils are equal, round, and reactive to light.   III,IV, VI: EOMI without ptosis or diploplia.  V: Facial sensation is symmetric to temperature VII: Facial movement is symmetric.  VIII: hearing is intact to voice X: Uvula elevates symmetrically XI: Shoulder shrug is symmetric. XII: tongue is midline without atrophy or fasciculations.  Motor: Tone is normal. Bulk is normal. 5/5 strength was present in all four extremities Sensory: Sensation is symmetric to light touch and temperature in the arms and legs; reduced in a small area of the back of the head on the right, as well as the right trunk Deep Tendon Reflexes: 2+ and symmetric in the biceps and patellae.  Plantars: Toes are downgoing bilaterally.  Cerebellar: Finger-nose is notable for some mild dysmetria with the left hand at end reach Gait: Able to rise on heels and toes. Mildly unsteady tandem  ASSESSMENT/PLAN   Patrick Roth is a 75 y.o. male with a past medical history significant for atrial flutter (on Eliquis), hypertension, hyperlipidemia, sciatica, prior stroke with right-sided weakness (minimal residual shoulder weakness).  He reports he has been in his usual state of health although he has noticed that his heart rates have been asymptomatically elevated into the 140s, with blood pressure narrow pulse pressure of 120/110s.  After he took a shower on 2/20 3 in the evening he noticed some numbness of the right leg from the knee down.  When he woke in  the morning he noticed he was numb in several areas of the right side of his body.  Most of this is resolved other than some residual numbness in the back of the head on the right side as well as his trunk.     Scattered multifocal strokes in the left MCA territory, most likely cardioembolic in the setting of known atrial flutter He will need follow up PCP and neurology outpatient.   Head CT without acute intracranial process   MRI brain with scattered punctate infarcts in the left hemisphere as detailed in radiology report   CTA with no clinically significant stenosis, incidentally left vertebral is small and ends in PICA and the right vertebral is dominant. There is some scattered atherosclerosis   2D Echo 55%, No shunt  LDL 201  HgbA1c 5.8  VTE prophylaxis - On Eliquis     Diet   Diet Heart Room service appropriate? Yes; Fluid consistency: Thin     On Eliquis PTA  Therapy recommendations:  Cleared by PT and OT for no needs  Disposition:  Home  Hypertension . Permissive hypertension (OK if < 220/120) but gradually normalize in 5-7 days . Long-term BP goal normotensive  Hyperlipidemia  LDL 201, goal < 70 Given allergy to statin (shortness of breath Crestor, also intolerant to Zetia), recommend PSK9 inhibitor on an outpatient basis  Atrial Flutter Followed in [place] [facility] History of medication non-adherence.  Previous cardioversions: 2018 Anticoagulation history: Xarelto, currently on Eliquis   Polycythemia While this does appear chronic since at least  2012, the patient reports a family history of elevated hemoglobin levels in his mother as well, and this could be contributing to hyperviscosity and stroke risk -Peripheral blood smear -Repeat CBC after 1 L NS bolus today: hgb 17.6->18.7 -JAK2 testing is pending -CXR: no acute findings -Further evaluation per primary team or outpatient provider  Other Stroke Risk Factors  Advanced Age >/= 27   Cigarette smoker  Obesity, Body mass index is 32.23 kg/m., BMI >/= 30 associated with increased stroke risk, recommend weight loss, diet and exercise as appropriate   Other Active Problems  He does not  have PCP  Hospital day # 0   Lynnae Sandhoff, MD Page: [phone]    To contact Stroke Continuity provider, please refer to [email or web address] After hours, contact General Neurology

## 2021-01-17 NOTE — Progress Notes (Signed)
  Echocardiogram 2D Echocardiogram has been performed.  Michiel Cowboy 01/17/2021, 9:48 AM

## 2021-01-17 NOTE — Care Management Obs Status (Signed)
[facility] NOTIFICATION   Patient Details  Name: Patrick Roth MRN: [id number] Date of Birth: 10-Jan-1946   Medicare Observation Status Notification Given:  Yes    Carles Collet, RN 01/17/2021, 5:08 PM

## 2021-01-17 NOTE — Discharge Summary (Signed)
Physician Discharge Summary  Patrick Roth QVZ:[id number] DOB: 13-Oct-1946 DOA: 01/16/2021  PCP: Patient, No Pcp Per  Admit date: 01/16/2021 Discharge date: 01/17/2021  Admitted From: home  Disposition:  Home   Recommendations for Outpatient Follow-up:  1. Follow up with PCP in 1-2 weeks 2. Follow up with neurology, a referral is sent.   Home Health:NA  Equipment/Devices:NA   Discharge Condition:stable   CODE STATUS:FUll code  Diet recommendation: low salt diet   Discharge Summary: 75 year old gentleman with history of previous stroke, chronic a flutter on Eliquis and metoprolol, hypertension, hyperlipidemia presented to the ER with right-sided numbness and tingling.  Symptoms ongoing for about 3 days. At the emergency room, he was found to have some sensory loss on his right lower extremity otherwise no acute neurological deficits.  Hemodynamically stable.  MRI showed scattered punctate infarcts in the left hemisphere consistent with cardioembolic pattern.  Patient was admitted to the hospital.  Seen and followed by neurology. MRI, as above.  Is scattered punctate infarction. CTA of the head and neck, essentially normal. 2D echocardiogram, essentially normal.  No source of emboli. Patient mobilizing in the hallway, minimal sensory deficit.  Plan: As per neurology recommendation patient is going home. Patient is already on Eliquis PTA that he will continue. LDL is 201, severe allergy to statin.  Will send a referral for outpatient lipid management clinic for so can start him on PSK 9 inhibitor. He does have chronic atrial flutter, heart rate is well controlled today so no change in his medication regimen done. Patient has polycythemia since 2012, followed by oncology.  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  JAK2 mutation has been sent, neurology will follow.     Discharge Diagnoses:  Principal Problem:   Acute arterial ischemic stroke, multifocal,  anterior circulation, left (HCC) Active Problems:   Essential hypertension   Diuretic-induced hypokalemia   Lumbar radiculopathy   Atrial flutter with rapid ventricular response [facility])   Mixed hyperlipidemia    Discharge Instructions  Discharge Instructions    Ambulatory referral to Neurology   Complete by: As directed    Follow up stroke: An appointment is requested in approximately: 4 weeks with Dr. Leonie Man or [facility] NP   Diet - low sodium heart healthy   Complete by: As directed    Increase activity slowly   Complete by: As directed      Allergies as of 01/17/2021      Reactions   Crestor [rosuvastatin Calcium] Shortness Of Breath   Muscle cramps   Spironolactone Shortness Of Breath   Terazosin Shortness Of Breath, Palpitations, Other (See Comments)   Nerve pain   Amiodarone Other (See Comments)   Dropped pulse real low to 45-50, and made him feel very cold   Atenolol Other (See Comments)   Drowsiness and "flu sxs"   Bystolic [nebivolol Hcl] Other (See Comments)   GI issues   Hydralazine Other (See Comments)   Joint swelling   Tizanidine Other (See Comments)   Syncope, Elevated BP/pulse   Amlodipine Other (See Comments)   Clonidine Other (See Comments)   Hydrocodone-acetaminophen Other (See Comments)   Levofloxacin Other (See Comments)   Losartan Other (See Comments)   Did not work.  Pt states he couldn't take b/c of SE, but can't remember what SE were.   Maxzide [hydrochlorothiazide W-triamterene] Other (See Comments)   Does not tolerate potassium-sparing diuretics   Olmesartan Other (See Comments)   Tape Other (See Comments)  Other reaction(s): Unknown   Tussionex Pennkinetic Er [hydrocod Polst-cpm Polst Er] Other (See Comments)   Other reaction(s): Unknown      Medication List    TAKE these medications   acetaminophen 650 MG CR tablet Commonly known as: TYLENOL Take 650 mg by mouth every 8 (eight) hours as needed for pain.   amLODipine 10 MG  tablet Commonly known as: NORVASC Take 10 mg by mouth daily.   chlorthalidone 25 MG tablet Commonly known as: HYGROTON Take 1 tablet by mouth once daily   Eliquis 5 MG Tabs tablet Generic drug: apixaban Take 1 tablet by mouth twice daily   gabapentin 100 MG capsule Commonly known as: NEURONTIN Take 1 capsule by mouth 4 times daily   metoprolol succinate 25 MG 24 hr tablet Commonly known as: Toprol XL Take 1 tablet (25 mg total) by mouth daily.   oxyCODONE-acetaminophen 5-325 MG tablet Commonly known as: PERCOCET/ROXICET Take 1 tablet by mouth every 4 (four) hours as needed for severe pain.   potassium chloride SA 20 MEQ tablet Commonly known as: KLOR-CON TAKE 1 TABLET BY MOUTH IN THE MORNING AND 2 IN THE EVENING   psyllium 0.52 g capsule Commonly known as: REGULOID Take by mouth.       Allergies  Allergen Reactions  . Crestor [Rosuvastatin Calcium] Shortness Of Breath    Muscle cramps  . Spironolactone Shortness Of Breath  . Terazosin Shortness Of Breath, Palpitations and Other (See Comments)    Nerve pain  . Amiodarone Other (See Comments)    Dropped pulse real low to 45-50, and made him feel very cold  . Atenolol Other (See Comments)    Drowsiness and "flu sxs"  . Bystolic [Nebivolol Hcl] Other (See Comments)    GI issues  . Hydralazine Other (See Comments)    Joint swelling  . Tizanidine Other (See Comments)    Syncope, Elevated BP/pulse  . Amlodipine Other (See Comments)  . Clonidine Other (See Comments)  . Hydrocodone-Acetaminophen Other (See Comments)  . Levofloxacin Other (See Comments)  . Losartan Other (See Comments)    Did not work.  Pt states he couldn't take b/c of SE, but can't remember what SE were.  . Maxzide [Hydrochlorothiazide W-Triamterene] Other (See Comments)    Does not tolerate potassium-sparing diuretics  . Olmesartan Other (See Comments)  . Tape Other (See Comments)    Other reaction(s): Unknown  . Tussionex Pennkinetic Er  [Hydrocod Polst-Cpm Polst Er] Other (See Comments)    Other reaction(s): Unknown    Consultations:  Neurology   Procedures/Studies: CT ANGIO HEAD W OR WO CONTRAST  Result Date: 01/16/2021 CLINICAL DATA:  Right-sided numbness over the last day. Acute infarctions seen in the left frontoparietal region. EXAM: CT ANGIOGRAPHY HEAD AND NECK TECHNIQUE: Multidetector CT imaging of the head and neck was performed using the standard protocol during bolus administration of intravenous contrast. Multiplanar CT image reconstructions and MIPs were obtained to evaluate the vascular anatomy. Carotid stenosis measurements (when applicable) are obtained utilizing NASCET criteria, using the distal internal carotid diameter as the denominator. CONTRAST:  58mL OMNIPAQUE IOHEXOL 350 MG/ML SOLN COMPARISON:  CT and MRI same day. FINDINGS: CTA NECK FINDINGS Aortic arch: Mild aortic atherosclerosis. Branching pattern is normal without origin stenosis. Right carotid system: Common carotid artery widely patent to the bifurcation. Calcified plaque at the carotid bifurcation but no stenosis. Soft plaque in the ICA bulb region but no stenosis. Cervical ICA is tortuous but widely patent beyond that. Left carotid system: Common  carotid artery widely patent to the bifurcation. Calcified plaque at the carotid bifurcation and ICA bulb but no stenosis. Cervical ICA is tortuous but widely patent beyond that. Vertebral arteries: Right vertebral artery is strongly dominant. Calcified plaque of the right subclavian artery and right vertebral artery origin with 30% origin stenosis. Wide patency beyond that. Tiny left vertebral artery is patent through the cervical region to the foramen magnum. Skeleton: Ordinary cervical spondylosis. Other neck: No mass or lymphadenopathy. Upper chest: Lung apices are clear. Review of the MIP images confirms the above findings CTA HEAD FINDINGS Anterior circulation: Both internal carotid arteries are patent  through the skull base and siphon regions. There is ordinary siphon atherosclerotic calcification but no stenosis. The anterior and middle cerebral vessels are patent without proximal stenosis, aneurysm or vascular malformation. No large or medium vessel occlusion. Posterior circulation: Right vertebral artery is widely patent to the basilar. Tiny left vertebral artery terminates in PICA. No basilar stenosis. Posterior circulation branch vessels are normal. Venous sinuses: Patent and normal. Anatomic variants: None significant. Review of the MIP images confirms the above findings IMPRESSION: 1. No intracranial large or medium vessel occlusion. 2. Mild atherosclerotic disease at both carotid bifurcation regions but without stenosis. No specific finding to explain the micro embolic infarctions in the left hemisphere. 3. 30% stenosis of the right vertebral artery origin. Aortic Atherosclerosis (ICD10-I70.0). Electronically Signed   By: Nelson Chimes M.D.   On: 01/16/2021 20:58   CT Head Wo Contrast  Result Date: 01/16/2021 CLINICAL DATA:  Neuro deficit. Acute change. Stroke suspected. History of stroke with RIGHT-sided symptoms in the past. EXAM: CT HEAD WITHOUT CONTRAST TECHNIQUE: Contiguous axial images were obtained from the base of the skull through the vertex without intravenous contrast. COMPARISON:  06/20/2016 FINDINGS: Brain: No evidence of acute infarction, hemorrhage, hydrocephalus, extra-axial collection or mass lesion/mass effect. Vascular: There is minimal atherosclerotic calcification of the internal carotid arteries. Skull: Normal. Negative for fracture or focal lesion. Sinuses/Orbits: No acute finding. Other: None. IMPRESSION: No evidence for acute intracranial abnormality. Electronically Signed   By: Nolon Nations M.D.   On: 01/16/2021 11:58   CT ANGIO NECK W OR WO CONTRAST  Result Date: 01/16/2021 CLINICAL DATA:  Right-sided numbness over the last day. Acute infarctions seen in the left  frontoparietal region. EXAM: CT ANGIOGRAPHY HEAD AND NECK TECHNIQUE: Multidetector CT imaging of the head and neck was performed using the standard protocol during bolus administration of intravenous contrast. Multiplanar CT image reconstructions and MIPs were obtained to evaluate the vascular anatomy. Carotid stenosis measurements (when applicable) are obtained utilizing NASCET criteria, using the distal internal carotid diameter as the denominator. CONTRAST:  16mL OMNIPAQUE IOHEXOL 350 MG/ML SOLN COMPARISON:  CT and MRI same day. FINDINGS: CTA NECK FINDINGS Aortic arch: Mild aortic atherosclerosis. Branching pattern is normal without origin stenosis. Right carotid system: Common carotid artery widely patent to the bifurcation. Calcified plaque at the carotid bifurcation but no stenosis. Soft plaque in the ICA bulb region but no stenosis. Cervical ICA is tortuous but widely patent beyond that. Left carotid system: Common carotid artery widely patent to the bifurcation. Calcified plaque at the carotid bifurcation and ICA bulb but no stenosis. Cervical ICA is tortuous but widely patent beyond that. Vertebral arteries: Right vertebral artery is strongly dominant. Calcified plaque of the right subclavian artery and right vertebral artery origin with 30% origin stenosis. Wide patency beyond that. Tiny left vertebral artery is patent through the cervical region to the foramen magnum. Skeleton: Ordinary  cervical spondylosis. Other neck: No mass or lymphadenopathy. Upper chest: Lung apices are clear. Review of the MIP images confirms the above findings CTA HEAD FINDINGS Anterior circulation: Both internal carotid arteries are patent through the skull base and siphon regions. There is ordinary siphon atherosclerotic calcification but no stenosis. The anterior and middle cerebral vessels are patent without proximal stenosis, aneurysm or vascular malformation. No large or medium vessel occlusion. Posterior circulation: Right  vertebral artery is widely patent to the basilar. Tiny left vertebral artery terminates in PICA. No basilar stenosis. Posterior circulation branch vessels are normal. Venous sinuses: Patent and normal. Anatomic variants: None significant. Review of the MIP images confirms the above findings IMPRESSION: 1. No intracranial large or medium vessel occlusion. 2. Mild atherosclerotic disease at both carotid bifurcation regions but without stenosis. No specific finding to explain the micro embolic infarctions in the left hemisphere. 3. 30% stenosis of the right vertebral artery origin. Aortic Atherosclerosis (ICD10-I70.0). Electronically Signed   By: Nelson Chimes M.D.   On: 01/16/2021 20:58   MR BRAIN WO CONTRAST  Result Date: 01/16/2021 CLINICAL DATA:  Neuro deficit, acute, stroke suspected Prior stroke, 1 day of right-sided numbness and sensation difference on hemibody. EXAM: MRI HEAD WITHOUT CONTRAST TECHNIQUE: Multiplanar, multiecho pulse sequences of the brain and surrounding structures were obtained without intravenous contrast. COMPARISON:  01/16/2021 and prior. FINDINGS: Brain: Scattered small acute infarcts involving the left frontoparietal region. Remote left thalamic insult with chronic hemosiderin deposition. No acute intracranial hemorrhage. No midline shift, ventriculomegaly or extra-axial fluid collection. No mass lesion. Mild cerebral atrophy with ex vacuo dilatation. Moderate chronic microvascular ischemic changes. Chronic right basal ganglia lacunar insult. Vascular: Major intracranial flow voids are proximally preserved. Skull and upper cervical spine: Normal marrow signal. Sinuses/Orbits: Normal orbits. Clear paranasal sinuses. No mastoid effusion. Other: None. IMPRESSION: Small multifocal acute infarcts involving the left frontoparietal region. Remote left thalamic insult with hemosiderin deposition. Chronic right basal ganglia lacunar insult. Moderate chronic microvascular ischemic changes.  Electronically Signed   By: Primitivo Gauze M.D.   On: 01/16/2021 18:36   DG CHEST PORT 1 VIEW  Result Date: 01/16/2021 CLINICAL DATA:  Shortness of breath EXAM: PORTABLE CHEST 1 VIEW COMPARISON:  05/04/2017 FINDINGS: The heart size and mediastinal contours are within normal limits. Both lungs are clear. The visualized skeletal structures are unremarkable. IMPRESSION: No active disease. Electronically Signed   By: Donavan Foil M.D.   On: 01/16/2021 23:34   ECHOCARDIOGRAM COMPLETE BUBBLE STUDY  Result Date: 01/17/2021    ECHOCARDIOGRAM REPORT   Patient Name:   Patrick Roth Date of Exam: 01/17/2021 Medical Rec #:  [id number]     Height:       68.0 in Accession #:    [id number]    Weight:       212.0 lb Date of Birth:  12-21-1945     BSA:          2.095 m Patient Age:    38 years      BP:           111/84 mmHg Patient Gender: M             HR:           114 bpm. Exam Location:  Inpatient Procedure: 2D Echo, Cardiac Doppler, Color Doppler, Saline Contrast Bubble Study            and Intracardiac Opacification Agent Indications:    Stroke 434.91 / I63.9  History:  Patient has prior history of Echocardiogram examinations, most                 recent 02/24/2018. Stroke, Arrythmias:Atrial Flutter,                 Signs/Symptoms:DOE; Risk Factors:Hypertension, Dyslipidemia and                 Non-Smoker.  Sonographer:    Vickie Epley RDCS Referring Phys: [id number] [place]  1. Left ventricular ejection fraction, by estimation, is 55%. The left ventricle has normal function. The left ventricle has no regional wall motion abnormalities. There is mild concentric left ventricular hypertrophy. Left ventricular diastolic parameters are indeterminate.  2. Right ventricular systolic function is normal. The right ventricular size is normal. There is normal pulmonary artery systolic pressure.  3. The mitral valve is grossly normal. No evidence of mitral valve regurgitation. No evidence of mitral  stenosis.  4. The aortic valve is tricuspid. There is mild calcification of the aortic valve. Aortic valve regurgitation is not visualized.  5. Agitated saline contrast bubble study was negative, with no evidence of any interatrial shunt. Comparison(s): A prior study was performed on 02/24/2018. Prior images reviewed side by side. Similar to prior; this study is more technically difficult. Ascending aorta of 37 mm (upper limit of normal) not see in this study. FINDINGS  Left Ventricle: Left ventricular ejection fraction, by estimation, is 55%. The left ventricle has normal function. The left ventricle has no regional wall motion abnormalities. Definity contrast agent was given IV to delineate the left ventricular endocardial borders. The left ventricular internal cavity size was normal in size. There is mild concentric left ventricular hypertrophy. Left ventricular diastolic parameters are indeterminate. Right Ventricle: The right ventricular size is normal. No increase in right ventricular wall thickness. Right ventricular systolic function is normal. There is normal pulmonary artery systolic pressure. The tricuspid regurgitant velocity is 2.25 m/s, and  with an assumed right atrial pressure of 3 mmHg, the estimated right ventricular systolic pressure is 32.6 mmHg. Left Atrium: Left atrial size was normal in size. Right Atrium: Right atrial size was normal in size. Pericardium: There is no evidence of pericardial effusion. Mitral Valve: The mitral valve is grossly normal. No evidence of mitral valve regurgitation. No evidence of mitral valve stenosis. Tricuspid Valve: The tricuspid valve is grossly normal. Tricuspid valve regurgitation is trivial. Aortic Valve: The aortic valve is tricuspid. There is mild calcification of the aortic valve. There is mild aortic valve annular calcification. Aortic valve regurgitation is not visualized. Pulmonic Valve: The pulmonic valve was not well visualized. Pulmonic valve  regurgitation is not visualized. Aorta: The aortic root is normal in size and structure and the ascending aorta was not well visualized. IAS/Shunts: The atrial septum is grossly normal. Agitated saline contrast was given intravenously to evaluate for intracardiac shunting. Agitated saline contrast bubble study was negative, with no evidence of any interatrial shunt.  LEFT VENTRICLE PLAX 2D LVIDd:         4.60 cm LVIDs:         3.30 cm LV PW:         1.10 cm LV IVS:        1.10 cm LVOT diam:     2.20 cm LVOT Area:     3.80 cm  RIGHT VENTRICLE TAPSE (M-mode): 1.6 cm LEFT ATRIUM             Index  RIGHT ATRIUM           Index LA diam:        4.30 cm 2.05 cm/m  RA Area:     14.80 cm LA Vol (A2C):   40.9 ml 19.52 ml/m RA Volume:   36.00 ml  17.18 ml/m LA Vol (A4C):   40.6 ml 19.38 ml/m LA Biplane Vol: 43.8 ml 20.91 ml/m   AORTA Ao Root diam: 3.40 cm TRICUSPID VALVE TR Peak grad:   20.2 mmHg TR Vmax:        225.00 cm/s  SHUNTS Systemic Diam: 2.20 cm Rudean Haskell MD Electronically signed by Rudean Haskell MD Signature Date/Time: 01/17/2021/1:03:38 PM    Final     (Echo, Carotid, EGD, Colonoscopy, ERCP)    Subjective: Patient seen and examined.  He is walking around the hallway and has no complaints.  Still has some tingling on the right face and most prominent on the right leg.  Otherwise denies any complaints.  Eager to go home.   Discharge Exam: Vitals:   01/17/21 1204 01/17/21 1641  BP: (!) 112/98 117/90  Pulse: (!) 114 (!) 107  Resp: 16 19  Temp: 98.1 F (36.7 C) 98.1 F (36.7 C)  SpO2: 100% 97%   Vitals:   01/17/21 0540 01/17/21 0824 01/17/21 1204 01/17/21 1641  BP: 99/73 111/84 (!) 112/98 117/90  Pulse:  (!) 110 (!) 114 (!) 107  Resp: 17 18 16 19   Temp: 98 F (36.7 C) 98.2 F (36.8 C) 98.1 F (36.7 C) 98.1 F (36.7 C)  TempSrc: Oral Oral Oral Oral  SpO2: 100% 97% 100% 97%  Weight:      Height:        General: Pt is alert, awake, not in acute  distress Cardiovascular: Irregular.  S1-S2 heard.  No murmurs. Respiratory: CTA bilaterally, no wheezing, no rhonchi Abdominal: Soft, NT, ND, bowel sounds + Extremities: no edema, no cyanosis No gross neurological deficit.    The results of significant diagnostics from this hospitalization (including imaging, microbiology, ancillary and laboratory) are listed below for reference.     Microbiology: Recent Results (from the past 240 hour(s))  Urine culture     Status: None   Collection Time: 01/16/21 11:23 AM   Specimen: Urine, Random  Result Value Ref Range Status   Specimen Description   Final    URINE, RANDOM Performed at [facility], [address] [place], [place] [address]    Special Requests   Final    NONE Performed at [facility], [address] [place], [place] [address]    Culture   Final    NO GROWTH Performed at [facility], [place] [address], [place], [place] [address]    Report Status 01/17/2021 FINAL  Final  SARS CORONAVIRUS 2 (TAT 6-24 HRS) Nasopharyngeal Nasopharyngeal Swab     Status: None   Collection Time: 01/16/21  6:45 PM   Specimen: Nasopharyngeal Swab  Result Value Ref Range Status   SARS Coronavirus 2 NEGATIVE NEGATIVE Final    Comment: (NOTE) SARS-CoV-2 target nucleic acids are NOT DETECTED.  The SARS-CoV-2 RNA is generally detectable in upper and lower respiratory specimens during the acute phase of infection. Negative results do not preclude SARS-CoV-2 infection, do not rule out co-infections with other pathogens, and should not be used as the sole basis for treatment or other patient management decisions. Negative results must be combined with clinical observations, patient history, and epidemiological information.  The expected result is Negative.  Fact Sheet for Patients: SugarRoll.be  Fact Sheet for Healthcare Providers: https://www.woods-mathews.com/  This test is  not yet approved or cleared by the [place] FDA and  has been authorized for detection and/or diagnosis of SARS-CoV-2 by FDA under an Emergency Use Authorization (EUA). This EUA will remain  in effect (meaning this test can be used) for the duration of the COVID-19 declaration under Se ction 564(b)(1) of the Act, 21 U.S.C. section 360bbb-3(b)(1), unless the authorization is terminated or revoked sooner.  Performed at [facility], [place] [address], [place], [place] [address]      Labs: BNP (last 3 results) No results for input(s): BNP in the last 8760 hours. Basic Metabolic Panel: Recent Labs  Lab 01/16/21 1122 01/17/21 0036  NA 137 137  K 3.2* 3.9  CL 98 100  CO2 29 27  GLUCOSE 93 92  BUN 14 12  CREATININE 1.31* 1.38*  CALCIUM 9.0 9.6  MG  --  1.8  1.9   Liver Function Tests: Recent Labs  Lab 01/16/21 1122 01/17/21 0036  AST 28 28  ALT 30 30  ALKPHOS 61 71  BILITOT 1.8* 3.2*  PROT 7.4 7.6  ALBUMIN 4.0 3.9   Recent Labs  Lab 01/16/21 1122  LIPASE 30   No results for input(s): AMMONIA in the last 168 hours. CBC: Recent Labs  Lab 01/16/21 1122 01/17/21 0036  WBC 6.2 6.5  NEUTROABS 3.3 4.0  HGB 17.6* 18.7*  HCT 53.1* 57.3*  MCV 89.2 88.4  PLT 178 172   Cardiac Enzymes: No results for input(s): CKTOTAL, CKMB, CKMBINDEX, TROPONINI in the last 168 hours. BNP: Invalid input(s): POCBNP CBG: No results for input(s): GLUCAP in the last 168 hours. D-Dimer No results for input(s): DDIMER in the last 72 hours. Hgb A1c Recent Labs    01/17/21 0036  HGBA1C 5.8*   Lipid Profile Recent Labs    01/17/21 0036  CHOL 271*  HDL 59  LDLCALC 201*  TRIG 55  CHOLHDL 4.6   Thyroid function studies Recent Labs    01/17/21 0036  TSH 3.801   Anemia work up No results for input(s): VITAMINB12, FOLATE, FERRITIN, TIBC, IRON, RETICCTPCT in the last 72 hours. Urinalysis    Component Value Date/Time   COLORURINE YELLOW 01/16/2021 1122    APPEARANCEUR CLEAR 01/16/2021 1122   LABSPEC 1.010 01/16/2021 1122   PHURINE 7.5 01/16/2021 1122   GLUCOSEU NEGATIVE 01/16/2021 1122   GLUCOSEU NEGATIVE 05/10/2016 1328   HGBUR NEGATIVE 01/16/2021 1122   BILIRUBINUR NEGATIVE 01/16/2021 1122   BILIRUBINUR neg 04/21/2016 1148   KETONESUR NEGATIVE 01/16/2021 1122   PROTEINUR NEGATIVE 01/16/2021 1122   UROBILINOGEN 0.2 05/10/2016 1328   NITRITE NEGATIVE 01/16/2021 1122   LEUKOCYTESUR NEGATIVE 01/16/2021 1122   Sepsis Labs Invalid input(s): PROCALCITONIN,  WBC,  LACTICIDVEN Microbiology Recent Results (from the past 240 hour(s))  Urine culture     Status: None   Collection Time: 01/16/21 11:23 AM   Specimen: Urine, Random  Result Value Ref Range Status   Specimen Description   Final    URINE, RANDOM Performed at [facility], [address] [place], [place] [address]    Special Requests   Final    NONE Performed at [facility], [address] [place], [place] [address]    Culture   Final    NO GROWTH Performed at [facility], [place] [address], [place], [place] [address]  Report Status 01/17/2021 FINAL  Final  SARS CORONAVIRUS 2 (TAT 6-24 HRS) Nasopharyngeal Nasopharyngeal Swab     Status: None   Collection Time: 01/16/21  6:45 PM   Specimen: Nasopharyngeal Swab  Result Value Ref Range Status   SARS Coronavirus 2 NEGATIVE NEGATIVE Final    Comment: (NOTE) SARS-CoV-2 target nucleic acids are NOT DETECTED.  The SARS-CoV-2 RNA is generally detectable in upper and lower respiratory specimens during the acute phase of infection. Negative results do not preclude SARS-CoV-2 infection, do not rule out co-infections with other pathogens, and should not be used as the sole basis for treatment or other patient management decisions. Negative results must be combined with clinical observations, patient history, and epidemiological information. The expected result is Negative.  Fact Sheet for  Patients: SugarRoll.be  Fact Sheet for Healthcare Providers: https://www.woods-mathews.com/  This test is not yet approved or cleared by the [place] FDA and  has been authorized for detection and/or diagnosis of SARS-CoV-2 by FDA under an Emergency Use Authorization (EUA). This EUA will remain  in effect (meaning this test can be used) for the duration of the COVID-19 declaration under Se ction 564(b)(1) of the Act, 21 U.S.C. section 360bbb-3(b)(1), unless the authorization is terminated or revoked sooner.  Performed at [facility], [place] [address], [place], [place] [address]      Time coordinating discharge:  32 minutes  SIGNED:   Barb Merino, MD  [facility] 01/17/2021, 5:19 PM

## 2021-01-17 NOTE — Evaluation (Signed)
Physical Therapy Evaluation Patient Details Name: Patrick Roth MRN: [id number] DOB: July 29, 1946 Today's Date: 01/17/2021   History of Present Illness  75 y.o. male with a past medical history significant for atrial flutter (on Eliquis), hypertension, hyperlipidemia, sciatica, prior stroke with right-sided weakness. Pt reports numbness in R side on 01/14/2021.  Clinical Impression  Pt presents to PT with no significant functional deficits at this time. Pt does report some continued R facial numbness but is able to mobilize independently for community distances. Pt has no further acute PT needs and is encouraged to ambulate out of the room at least 3x a day for the remainder of this admission. Acute PT signing off.    Follow Up Recommendations No PT follow up    Equipment Recommendations  None recommended by PT    Recommendations for Other Services       Precautions / Restrictions Precautions Precautions: None Restrictions Weight Bearing Restrictions: No      Mobility  Bed Mobility Overal bed mobility: Independent                  Transfers Overall transfer level: Independent                  Ambulation/Gait Ambulation/Gait assistance: Independent Gait Distance (Feet): 300 Feet Assistive device: None Gait Pattern/deviations: WFL(Within Functional Limits) Gait velocity: functional Gait velocity interpretation: >2.62 ft/sec, indicative of community ambulatory General Gait Details: pt is able to perform head turns, change gait speed, stop abruptly, and turn quickly all without LOB  Stairs Stairs: Yes Stairs assistance: Independent Stair Management: No rails;Alternating pattern Number of Stairs: 8    Wheelchair Mobility    Modified Rankin (Stroke Patients Only) Modified Rankin (Stroke Patients Only) Pre-Morbid Rankin Score: No significant disability Modified Rankin: No significant disability     Balance Overall balance assessment: Independent                                            Pertinent Vitals/Pain Pain Assessment: Faces Faces Pain Scale: Hurts a little bit Pain Location: R knee Pain Descriptors / Indicators: Aching Pain Intervention(s): Monitored during session    Home Living Family/patient expects to be discharged to:: Private residence Living Arrangements: Spouse/significant other Available Help at Discharge: Family;Available 24 hours/day Type of Home: House Home Access: Stairs to enter Entrance Stairs-Rails: Can reach both Entrance Stairs-Number of Steps: 5 Home Layout: Two level;Able to live on main level with bedroom/bathroom Home Equipment: None      Prior Function Level of Independence: Independent         Comments: 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     Hand Dominance        Extremity/Trunk Assessment   Upper Extremity Assessment Upper Extremity Assessment: Overall WFL for tasks assessed    Lower Extremity Assessment Lower Extremity Assessment: Overall WFL for tasks assessed    Cervical / Trunk Assessment Cervical / Trunk Assessment:  (pt reports R facial numbness)  Communication   Communication: No difficulties  Cognition Arousal/Alertness: Awake/alert Behavior During Therapy: WFL for tasks assessed/performed Overall Cognitive Status: Within Functional Limits for tasks assessed                                        General Comments General  comments (skin integrity, edema, etc.): pt tachy to 115 with mobility, otherwise VSS    Exercises     Assessment/Plan    PT Assessment Patent does not need any further PT services  PT Problem List         PT Treatment Interventions      PT Goals (Current goals can be found in the Care Plan section)       Frequency     Barriers to discharge        Co-evaluation               AM-PAC PT "6 Clicks" Mobility  Outcome Measure Help needed turning from your back to your  side while in a flat bed without using bedrails?: None Help needed moving from lying on your back to sitting on the side of a flat bed without using bedrails?: None Help needed moving to and from a bed to a chair (including a wheelchair)?: None Help needed standing up from a chair using your arms (e.g., wheelchair or bedside chair)?: None Help needed to walk in hospital room?: None Help needed climbing 3-5 steps with a railing? : None 6 Click Score: 24    End of Session   Activity Tolerance: Patient tolerated treatment well Patient left: Other (comment) (in transport chair headed to [facility]) Nurse Communication: Mobility status      Time: 7829-5621 PT Time Calculation (min) (ACUTE ONLY): 19 min   Charges:   PT Evaluation $PT Eval Low Complexity: Bartlett, PT, DPT Acute Rehabilitation Pager: [phone]   Zenaida Niece 01/17/2021, 9:10 AM

## 2021-01-17 NOTE — Plan of Care (Signed)
  Problem: Education: Goal: Knowledge of General Education information will improve Description Including pain rating scale, medication(s)/side effects and non-pharmacologic comfort measures Outcome: Progressing   Problem: Health Behavior/Discharge Planning: Goal: Ability to manage health-related needs will improve Outcome: Progressing   

## 2021-01-17 NOTE — Discharge Instructions (Signed)

## 2021-01-19 ENCOUNTER — Other Ambulatory Visit: Payer: Self-pay | Admitting: Physician Assistant

## 2021-01-19 ENCOUNTER — Telehealth: Payer: Self-pay

## 2021-01-19 DIAGNOSIS — M5416 Radiculopathy, lumbar region: Secondary | ICD-10-CM

## 2021-01-19 LAB — PATHOLOGIST SMEAR REVIEW

## 2021-01-19 NOTE — Chronic Care Management (AMB) (Signed)
Chronic Care Management Pharmacy Assistant   Name: LETICIA MCDIARMID  MRN: [id number] DOB: [birth year]  Reason for Encounter: Chart Review  PCP : Patient, No Pcp Per  Allergies:   Allergies  Allergen Reactions  . Crestor [Rosuvastatin Calcium] Shortness Of Breath    Muscle cramps  . Spironolactone Shortness Of Breath  . Terazosin Shortness Of Breath, Palpitations and Other (See Comments)    Nerve pain  . Amiodarone Other (See Comments)    Dropped pulse real low to 45-50, and made him feel very cold  . Atenolol Other (See Comments)    Drowsiness and "flu sxs"  . Bystolic [Nebivolol Hcl] Other (See Comments)    GI issues  . Hydralazine Other (See Comments)    Joint swelling  . Tizanidine Other (See Comments)    Syncope, Elevated BP/pulse  . Amlodipine Other (See Comments)  . Clonidine Other (See Comments)  . Hydrocodone-Acetaminophen Other (See Comments)  . Levofloxacin Other (See Comments)  . Losartan Other (See Comments)    Did not work.  Pt states he couldn't take b/c of SE, but can't remember what SE were.  . Maxzide [Hydrochlorothiazide W-Triamterene] Other (See Comments)    Does not tolerate potassium-sparing diuretics  . Olmesartan Other (See Comments)  . Tape Other (See Comments)    Other reaction(s): Unknown  . Tussionex Pennkinetic Er [Hydrocod Polst-Cpm Polst Er] Other (See Comments)    Other reaction(s): Unknown    Medications: Outpatient Encounter Medications as of 01/19/2021  Medication Sig  . acetaminophen (TYLENOL) 650 MG CR tablet Take 650 mg by mouth every 8 (eight) hours as needed for pain.  Marland Kitchen amLODipine (NORVASC) 10 MG tablet Take 10 mg by mouth daily.  . chlorthalidone (HYGROTON) 25 MG tablet Take 1 tablet by mouth once daily  . ELIQUIS 5 MG TABS tablet Take 1 tablet by mouth twice daily  . gabapentin (NEURONTIN) 100 MG capsule Take 1 capsule by mouth 4 times daily  . metoprolol succinate (TOPROL XL) 25 MG 24 hr tablet Take 1 tablet (25 mg total) by  mouth daily.  Marland Kitchen oxyCODONE-acetaminophen (PERCOCET/ROXICET) 5-325 MG tablet Take 1 tablet by mouth every 4 (four) hours as needed for severe pain.  . potassium chloride SA (KLOR-CON) 20 MEQ tablet TAKE 1 TABLET BY MOUTH IN THE MORNING AND 2 IN THE EVENING  . psyllium (REGULOID) 0.52 g capsule Take by mouth.   No facility-administered encounter medications on file as of 01/19/2021.    Current Diagnosis: Patient Active Problem List   Diagnosis Date Noted  . Acute arterial ischemic stroke, multifocal, anterior circulation, left ([place]) 01/16/2021  . Secondary hypercoagulable state ([place]) 09/11/2020  . Cyst of perineum in male 11/28/2018  . Mixed hyperlipidemia   . History of chicken pox   . Colon polyps   . Atrial flutter with rapid ventricular response ([place])   . Other intervertebral disc degeneration, lumbar region 05/09/2017  . Lumbar radiculopathy 03/29/2017  . Persistent atrial fibrillation ([place]) 09/14/2016  . Diuretic-induced hypokalemia 06/28/2016  . Essential hypertension 05/27/2015  . Mallet deformity of fourth finger, right 07/11/2013  . Stroke [facility]) 08/23/2012    Reviewed chart for medication changes.  01/08/2021 OV Alyssa Allwardt, PA-C; TOC visit from [facility], PA-C. Skin lesion of left arm sent for pathology, chronic conditions stable, no medication changes noted.  01/16/2021 ED to Hosp-Admission Dr. Sloan Leiter; right sided numbness and tingling x 3 days (Acute arterial ischemic stroke, multifocal, anterior circulation, left), MRI- is scattered punctate infarction, refer to  outpatient lipid management clinic so can start him on PSK 9 inhibitor - patient has statin allergy.  Future Appointments  Date Time Provider [place]  01/26/2021 11:00 AM Allwardt, Nicholes Rough [facility]  02/12/2021 10:00 AM Allwardt, [name], PA-C [facility]  02/18/2021  9:15 AM Shirley Friar, PA-C [facility]  10/12/2021  3:00 PM [facility]     April D Calhoun, [place] Pharmacist Assistant [phone]   Follow-Up:  Pharmacist Review

## 2021-01-20 ENCOUNTER — Telehealth: Payer: Self-pay

## 2021-01-20 NOTE — Chronic Care Management (AMB) (Signed)
Chronic Care Management Pharmacy Assistant   Name: Patrick Roth  MRN: [id number] DOB: 27-Sep-1946  Reason for Encounter: Disease State/ General Adherence Call  PCP : Patient, No Pcp Per  Allergies:   Allergies  Allergen Reactions  . Crestor [Rosuvastatin Calcium] Shortness Of Breath    Muscle cramps  . Spironolactone Shortness Of Breath  . Terazosin Shortness Of Breath, Palpitations and Other (See Comments)    Nerve pain  . Amiodarone Other (See Comments)    Dropped pulse real low to 45-50, and made him feel very cold  . Atenolol Other (See Comments)    Drowsiness and "flu sxs"  . Bystolic [Nebivolol Hcl] Other (See Comments)    GI issues  . Hydralazine Other (See Comments)    Joint swelling  . Tizanidine Other (See Comments)    Syncope, Elevated BP/pulse  . Amlodipine Other (See Comments)  . Clonidine Other (See Comments)  . Hydrocodone-Acetaminophen Other (See Comments)  . Levofloxacin Other (See Comments)  . Losartan Other (See Comments)    Did not work.  Pt states he couldn't take b/c of SE, but can't remember what SE were.  . Maxzide [Hydrochlorothiazide W-Triamterene] Other (See Comments)    Does not tolerate potassium-sparing diuretics  . Olmesartan Other (See Comments)  . Tape Other (See Comments)    Other reaction(s): Unknown  . Tussionex Pennkinetic Er [Hydrocod Polst-Cpm Polst Er] Other (See Comments)    Other reaction(s): Unknown    Medications: Outpatient Encounter Medications as of 01/20/2021  Medication Sig  . acetaminophen (TYLENOL) 650 MG CR tablet Take 650 mg by mouth every 8 (eight) hours as needed for pain.  Marland Kitchen amLODipine (NORVASC) 10 MG tablet Take 10 mg by mouth daily.  . chlorthalidone (HYGROTON) 25 MG tablet Take 1 tablet by mouth once daily  . ELIQUIS 5 MG TABS tablet Take 1 tablet by mouth twice daily  . gabapentin (NEURONTIN) 100 MG capsule Take 1 capsule by mouth 4 times daily  . metoprolol succinate (TOPROL XL) 25 MG 24 hr tablet Take 1  tablet (25 mg total) by mouth daily.  Marland Kitchen oxyCODONE-acetaminophen (PERCOCET/ROXICET) 5-325 MG tablet Take 1 tablet by mouth every 4 (four) hours as needed for severe pain.  . potassium chloride SA (KLOR-CON) 20 MEQ tablet TAKE 1 TABLET BY MOUTH IN THE MORNING AND 2 IN THE EVENING  . psyllium (REGULOID) 0.52 g capsule Take by mouth.   No facility-administered encounter medications on file as of 01/20/2021.      Current Diagnosis: Patient Active Problem List   Diagnosis Date Noted  . Acute arterial ischemic stroke, multifocal, anterior circulation, left ([place]) 01/16/2021  . Secondary hypercoagulable state ([place]) 09/11/2020  . Cyst of perineum in male 11/28/2018  . Mixed hyperlipidemia   . History of chicken pox   . Colon polyps   . Atrial flutter with rapid ventricular response ([place])   . Other intervertebral disc degeneration, lumbar region 05/09/2017  . Lumbar radiculopathy 03/29/2017  . Persistent atrial fibrillation ([place]) 09/14/2016  . Diuretic-induced hypokalemia 06/28/2016  . Essential hypertension 05/27/2015  . Mallet deformity of fourth finger, right 07/11/2013  . Stroke [facility]) 08/23/2012   Reviewed chart for medication changes ahead of disease state call.  01/16/2021 ED to Admission; right sided numbness, MRI scattered punctate infarction, continue Eliquis will refer to outpatient lipid management due to severe statin allergy. Follow up with PCP 1-2 weeks, referral sent to neurology.  Have you had any problems recently with your health? Patient states he  recently had a stroke with "bleeding on the brain". Patient states he has a follow up appointment with new PCP Alyssa Allwardt PA-C next week.  Have you had any problems with your pharmacy? Patient states he has not had any problems recently with his pharmacy.  What issues or side effects are you having with your medications? Patient states he is not currently having any issues or side effects from any of his medications at this  time.  What would you like me to pass along to [name], CPP for him to help you with?  Patient states he does not have anything to pass along at this time.  What can we do to take care of you better? Patient states he feels he is doing well at this time.  Future Appointments  Date Time Provider [place]  01/26/2021 11:00 AM Allwardt, Nicholes Rough [facility]  01/29/2021 10:40 AM Patsey Berthold, NP [facility]  02/12/2021 10:00 AM Allwardt, [name], PA-C [facility]  10/12/2021  3:00 PM [facility]    Patient scheduled his follow up telephone appointment with [name] CPP on Monday 03/09/2021 at 8:30 am.  April D Calhoun, [place] Pharmacist Assistant [phone]   Follow-Up:  Pharmacist Review

## 2021-01-26 ENCOUNTER — Inpatient Hospital Stay: Payer: Medicare HMO | Admitting: Physician Assistant

## 2021-01-28 ENCOUNTER — Other Ambulatory Visit: Payer: Self-pay | Admitting: Physician Assistant

## 2021-01-28 ENCOUNTER — Encounter: Payer: Self-pay | Admitting: Physician Assistant

## 2021-01-28 ENCOUNTER — Other Ambulatory Visit: Payer: Self-pay

## 2021-01-28 ENCOUNTER — Ambulatory Visit (INDEPENDENT_AMBULATORY_CARE_PROVIDER_SITE_OTHER): Payer: Medicare HMO | Admitting: Physician Assistant

## 2021-01-28 VITALS — BP 135/86 | HR 107 | Temp 98.0°F | Ht 68.0 in | Wt 216.2 lb

## 2021-01-28 DIAGNOSIS — D751 Secondary polycythemia: Secondary | ICD-10-CM

## 2021-01-28 DIAGNOSIS — I10 Essential (primary) hypertension: Secondary | ICD-10-CM | POA: Diagnosis not present

## 2021-01-28 DIAGNOSIS — E782 Mixed hyperlipidemia: Secondary | ICD-10-CM | POA: Diagnosis not present

## 2021-01-28 DIAGNOSIS — Z8673 Personal history of transient ischemic attack (TIA), and cerebral infarction without residual deficits: Secondary | ICD-10-CM | POA: Diagnosis not present

## 2021-01-28 DIAGNOSIS — I63522 Cerebral infarction due to unspecified occlusion or stenosis of left anterior cerebral artery: Secondary | ICD-10-CM

## 2021-01-28 DIAGNOSIS — I4892 Unspecified atrial flutter: Secondary | ICD-10-CM

## 2021-01-28 NOTE — Progress Notes (Signed)
Established Patient Office Visit  Subjective:  Patient ID: Patrick Roth, male    DOB: 18-Mar-1946  Age: 75 y.o. MRN: [id number]  CC:  Chief Complaint  Patient presents with  . Hospitalization Follow-up    HPI Patrick Roth presents for hospital follow-up.  He suffered an acute arterial ischemic stroke, multifocal on the left side on 01-16-21.  He was discharged in stable condition on 01-17-21.  Patient states this was his second stroke (prior was about 13 years ago).  Patient has a history of chronic a flutter and he is on Eliquis and metoprolol.  His MRI of the head showed scattered punctate infarcts in the left hemisphere.  He also had a CTA of the head and neck which was normal.  His 2D echocardiogram was essentially normal.  He only had minimal sensory deficit from this event.  Patient tells me today that he is doing really well.  He has not had any changes in vision, nor has he had any dizziness, weakness, gait problems, memory or thought impairment.  He does feel a little bit of numbness along the right side of his forehead otherwise feels like nothing has changed medically for him.  He is concerned about having future strokes.   Past Medical History:  Diagnosis Date  . Atrial flutter ([place])   . Colon polyps   . Constipation 05/27/2015  . DOE (dyspnea on exertion) 10/13/2015  . Faintness 06/20/2016  . History of chicken pox   . Hyperlipemia 02/13/2016  . Hyperlipidemia   . Hypertension   . Hypokalemia 06/28/2016  . Hyponatremia 06/28/2016  . Internal hemorrhoids   . Irregular heartbeat   . Malignant hypertension 07/15/2015  . Sciatica   . Stroke [facility]) 2008  . Syncope and collapse 06/28/2016    Past Surgical History:  Procedure Laterality Date  . CARDIOVERSION N/A 09/02/2017   Procedure: CARDIOVERSION;  Surgeon: [name], MD;  Location: [facility] ENDOSCOPY;  Service: Cardiovascular;  Laterality: N/A;  . COLONOSCOPY  2011  . great toe surgery    . PROSTATE BIOPSY      Family  History  Problem Relation Age of Onset  . Hypertension Father 15       Deceased  . Heart disease Father   . Lung cancer Mother 23       Deceased  . Healthy Sister        x3  . Healthy Brother        x4  . Heart disease Brother        #5  . Other Daughter        Alpha Thalassemia  . Lupus Daughter        #2-deceased  . Diabetes Neg Hx   . Heart attack Neg Hx   . Hyperlipidemia Neg Hx   . Sudden death Neg Hx     Social History   Socioeconomic History  . Marital status: Married    Spouse name: Not on file  . Number of children: 1  . Years of education: 43  . Highest education level: Not on file  Occupational History  . Occupation: Retired  Tobacco Use  . Smoking status: Never Smoker  . Smokeless tobacco: Never Used  Vaping Use  . Vaping Use: Never used  Substance and Sexual Activity  . Alcohol use: No  . Drug use: No  . Sexual activity: Not on file  Other Topics Concern  . Not on file  Social History Narrative   Fun:  Play golf and build furniture.    Social Determinants of Health   Financial Resource Strain: Low Risk   . Difficulty of Paying Living Expenses: Not hard at all  Food Insecurity: No Food Insecurity  . Worried About Charity fundraiser in the Last Year: Never true  . Ran Out of Food in the Last Year: Never true  Transportation Needs: No Transportation Needs  . Lack of Transportation (Medical): No  . Lack of Transportation (Non-Medical): No  Physical Activity: Sufficiently Active  . Days of Exercise per Week: 7 days  . Minutes of Exercise per Session: 30 min  Stress: No Stress Concern Present  . Feeling of Stress : Not at all  Social Connections: Moderately Isolated  . Frequency of Communication with Friends and Family: More than three times a week  . Frequency of Social Gatherings with Friends and Family: Once a week  . Attends Religious Services: Never  . Active Member of Clubs or Organizations: No  . Attends Archivist Meetings:  Never  . Marital Status: Married  Human resources officer Violence: Not At Risk  . Fear of Current or Ex-Partner: No  . Emotionally Abused: No  . Physically Abused: No  . Sexually Abused: No    Outpatient Medications Prior to Visit  Medication Sig Dispense Refill  . acetaminophen (TYLENOL) 650 MG CR tablet Take 650 mg by mouth every 8 (eight) hours as needed for pain.    Marland Kitchen amLODipine (NORVASC) 10 MG tablet Take 10 mg by mouth daily.    . chlorthalidone (HYGROTON) 25 MG tablet Take 1 tablet by mouth once daily 90 tablet 0  . ELIQUIS 5 MG TABS tablet Take 1 tablet by mouth twice daily 60 tablet 5  . gabapentin (NEURONTIN) 100 MG capsule Take 1 capsule by mouth 4 times daily 360 capsule 0  . metoprolol succinate (TOPROL XL) 25 MG 24 hr tablet Take 1 tablet (25 mg total) by mouth daily. 90 tablet 3  . oxyCODONE-acetaminophen (PERCOCET/ROXICET) 5-325 MG tablet Take 1 tablet by mouth every 4 (four) hours as needed for severe pain. 12 tablet 0  . potassium chloride SA (KLOR-CON) 20 MEQ tablet TAKE 1 TABLET BY MOUTH IN THE MORNING AND 2 IN THE EVENING 270 tablet 1  . psyllium (REGULOID) 0.52 g capsule Take by mouth.     No facility-administered medications prior to visit.    Allergies  Allergen Reactions  . Crestor [Rosuvastatin Calcium] Shortness Of Breath    Muscle cramps  . Spironolactone Shortness Of Breath  . Terazosin Shortness Of Breath, Palpitations and Other (See Comments)    Nerve pain  . Amiodarone Other (See Comments)    Dropped pulse real low to 45-50, and made him feel very cold  . Atenolol Other (See Comments)    Drowsiness and "flu sxs"  . Bystolic [Nebivolol Hcl] Other (See Comments)    GI issues  . Hydralazine Other (See Comments)    Joint swelling  . Tizanidine Other (See Comments)    Syncope, Elevated BP/pulse  . Amlodipine Other (See Comments)  . Clonidine Other (See Comments)  . Hydrocodone-Acetaminophen Other (See Comments)  . Levofloxacin Other (See Comments)  .  Losartan Other (See Comments)    Did not work.  Pt states he couldn't take b/c of SE, but can't remember what SE were.  . Maxzide [Hydrochlorothiazide W-Triamterene] Other (See Comments)    Does not tolerate potassium-sparing diuretics  . Olmesartan Other (See Comments)  . Tape Other (See Comments)  Other reaction(s): Unknown  . Tussionex Pennkinetic Er [Hydrocod Polst-Cpm Polst Er] Other (See Comments)    Other reaction(s): Unknown    ROS Review of Systems  Constitutional: Negative for activity change, appetite change and unexpected weight change.  Eyes: Negative for visual disturbance.  Respiratory: Negative for apnea and shortness of breath.   Cardiovascular: Negative for chest pain, palpitations and leg swelling.  Gastrointestinal: Negative for blood in stool.  Endocrine: Negative for polydipsia, polyphagia and polyuria.  Genitourinary: Negative for difficulty urinating.  Musculoskeletal: Negative for arthralgias.  Skin: Negative for rash.  Neurological: Positive for numbness (some slight numb feeling still along R side of forehead). Negative for dizziness, tremors, seizures, syncope, facial asymmetry, speech difficulty, weakness, light-headedness and headaches.  Psychiatric/Behavioral: Negative for sleep disturbance and suicidal ideas.      Objective:    Physical Exam Vitals and nursing note reviewed.  Constitutional:      Appearance: Normal appearance. He is obese.  HENT:     Head: Normocephalic and atraumatic.     Nose: Nose normal.     Mouth/Throat:     Mouth: Mucous membranes are moist.  Eyes:     Extraocular Movements: Extraocular movements intact.     Conjunctiva/sclera: Conjunctivae normal.     Pupils: Pupils are equal, round, and reactive to light.  Cardiovascular:     Rate and Rhythm: Regular rhythm. Tachycardia present.     Pulses: Normal pulses.     Heart sounds: Normal heart sounds. No murmur heard.   Pulmonary:     Effort: Pulmonary effort is  normal.     Breath sounds: Normal breath sounds.  Abdominal:     General: Abdomen is flat.     Palpations: Abdomen is soft.  Musculoskeletal:     Cervical back: Normal range of motion.  Neurological:     General: No focal deficit present.     Mental Status: He is alert and oriented to person, place, and time.     Cranial Nerves: No cranial nerve deficit.     Motor: No weakness.     Coordination: Coordination normal.     Gait: Gait normal.  Psychiatric:        Mood and Affect: Mood normal.        Behavior: Behavior normal.        Thought Content: Thought content normal.     BP 135/86   Pulse (!) 107   Temp 98 F (36.7 C)   Ht 5\' 8"  (1.727 m)   Wt 216 lb 3.2 oz (98.1 kg)   SpO2 97%   BMI 32.87 kg/m  Wt Readings from Last 3 Encounters:  01/28/21 216 lb 3.2 oz (98.1 kg)  01/17/21 212 lb (96.2 kg)  01/08/21 214 lb 3.2 oz (97.2 kg)      Lab Results  Component Value Date   TSH 3.801 01/17/2021   Lab Results  Component Value Date   WBC 6.5 01/17/2021   HGB 18.7 (H) 01/17/2021   HCT 57.3 (H) 01/17/2021   MCV 88.4 01/17/2021   PLT 172 01/17/2021   Lab Results  Component Value Date   NA 137 01/17/2021   K 3.9 01/17/2021   CO2 27 01/17/2021   GLUCOSE 92 01/17/2021   BUN 12 01/17/2021   CREATININE 1.38 (H) 01/17/2021   BILITOT 3.2 (H) 01/17/2021   ALKPHOS 71 01/17/2021   AST 28 01/17/2021   ALT 30 01/17/2021   PROT 7.6 01/17/2021   ALBUMIN 3.9 01/17/2021   CALCIUM  9.6 01/17/2021   ANIONGAP 10 01/17/2021   GFR 58.48 (L) 10/27/2020   Lab Results  Component Value Date   CHOL 271 (H) 01/17/2021   Lab Results  Component Value Date   HDL 59 01/17/2021   Lab Results  Component Value Date   LDLCALC 201 (H) 01/17/2021   Lab Results  Component Value Date   TRIG 55 01/17/2021   Lab Results  Component Value Date   CHOLHDL 4.6 01/17/2021   Lab Results  Component Value Date   HGBA1C 5.8 (H) 01/17/2021      Assessment & Plan:   Problem List Items  Addressed This Visit      Cardiovascular and Mediastinum   Essential hypertension   Atrial flutter with rapid ventricular response (HCC)   Acute arterial ischemic stroke, multifocal, anterior circulation, left ([place]) - Primary     Other   Mixed hyperlipidemia    Other Visit Diagnoses    Polycythemia          No orders of the defined types were placed in this encounter.   Follow-up: Return in about 6 months (around 07/31/2021) for CPE and labs (please cancel his CPE in March with me) .   1. Acute arterial ischemic stroke, multifocal, anterior circulation, left (HCC) No severe deficits following this event.  He is going to continue taking his Eliquis 5 mg 1 tablet twice daily.  He is going to work on increasing exercise and healthy diet.  He has follow-up with his cardiologist tomorrow.  He also has an appointment with neurology set up.  2. Essential hypertension His blood pressure is stable today.  He will continue to take his regular medications of chlorthalidone and amlodipine, and monitor BP at home.  3. Mixed hyperlipidemia He is going to follow-up tomorrow with outpatient lipid management clinic to be able to start on a PSK 9 inhibitor as he has a severe allergy to statin medications.  His most recent cholesterol was 271.  4. Atrial flutter with rapid ventricular response (HCC) He is slightly tachycardic today but having no symptoms.  He follows up with his cardiologist tomorrow.  He will continue to take his metoprolol.  5. Polycythemia He missed his appointment with Dr. Lorenso Courier in December.  I called their office today asking them to call patient so he can reschedule with them.  His most recent hemoglobin was 18.7.  He does have a history of bone marrow sarcoid that they have been following.   Alechia Lezama M Alyn Riedinger, PA-C

## 2021-01-28 NOTE — Progress Notes (Signed)
Patient ID: Patrick Roth                 DOB: 06-Mar-1946                    MRN: [id number]     HPI: Patrick Roth is a 75 y.o. male patient of Dr. [name] referred to lipid clinic by Dr. Sloan Leiter (hospitalist from recent admission at [facility]). PMH is significant for previous stroke (2009, 2022), paroxysmal afib on Eliquis, HTN, HLD. Patient was recently admitted to the hospital 01/16/21 for right sided numbness and tingling, MRI showed scattered punctate infarcts in the left hemisphere consistent with cardioembolic pattern. Referred by Dr. Sloan Leiter at discharge to lipid clinic to start PCSK9 inhibitor due to history of statin intolerances.   Today, patient arrives in good spirits. Reports trying many cholesterol medications including multiple statins over the last 13 years since his first stroke. Allergies to statins listed in chart with SOB and muscle pain. Patient cannot recall specific reactions at this time but reports not tolerating any of them even on very low doses. He is willing to try PCSK9 inhibitor therapy and asks about cost.   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, walks there and back twice a day (1 mile/day)  Family History: Hypertension in father, heart disease in father and brother  Social History: Never smoker, no alcohol use  Labs: 01/17/21: TC 271, TG 55, HDL 59, LDL 201 (no lipid lowering therapy)  Past Medical History:   Diagnosis Date   Atrial flutter ([place])    Colon polyps    Constipation 05/27/2015   DOE (dyspnea on exertion) 10/13/2015   Faintness 06/20/2016   History of chicken pox    Hyperlipemia 02/13/2016   Hyperlipidemia    Hypertension    Hypokalemia 06/28/2016   Hyponatremia 06/28/2016   Internal hemorrhoids    Irregular heartbeat    Malignant hypertension 07/15/2015   Sciatica    Stroke [facility]) 2008   Syncope and collapse 06/28/2016    Current Outpatient Medications on File Prior to Visit  Medication Sig Dispense Refill   acetaminophen (TYLENOL) 650 MG CR tablet Take 650 mg by mouth every 8 (eight) hours as needed for pain.     amLODipine (NORVASC) 10 MG tablet Take 10 mg by mouth daily.     chlorthalidone (HYGROTON) 25 MG tablet Take 1 tablet by mouth once daily 90 tablet 0   ELIQUIS 5 MG TABS tablet Take 1 tablet by mouth twice daily 60 tablet 5   gabapentin (NEURONTIN) 100 MG capsule Take 1 capsule by mouth 4 times daily 360 capsule 0   metoprolol succinate (TOPROL XL) 25 MG 24 hr tablet Take 1 tablet (25 mg total) by mouth daily. 90 tablet 3   oxyCODONE-acetaminophen (PERCOCET/ROXICET) 5-325 MG tablet Take 1 tablet by mouth every 4 (four) hours as needed for severe pain. 12 tablet 0   potassium chloride SA (KLOR-CON) 20  MEQ tablet TAKE 1 TABLET BY MOUTH IN THE MORNING AND 2 IN THE EVENING 270 tablet 1   No current facility-administered medications on file prior to visit.    Allergies  Allergen Reactions   Crestor [Rosuvastatin Calcium] Shortness Of Breath    Muscle cramps   Spironolactone Shortness Of Breath   Terazosin Shortness Of Breath, Palpitations and Other (See Comments)    Nerve pain   Amiodarone Other (See Comments)    Dropped pulse real low to 45-50, and made him feel very cold   Atenolol Other (See Comments)    Drowsiness and "flu sxs"   Bystolic [Nebivolol Hcl] Other (See Comments)    GI issues   Hydralazine Other (See Comments)    Joint  swelling   Tizanidine Other (See Comments)    Syncope, Elevated BP/pulse   Amlodipine Other (See Comments)   Clonidine Other (See Comments)   Hydrocodone-Acetaminophen Other (See Comments)   Levofloxacin Other (See Comments)   Losartan Other (See Comments)    Did not work.  Pt states he couldn't take b/c of SE, but can't remember what SE were.   Maxzide [Hydrochlorothiazide W-Triamterene] Other (See Comments)    Does not tolerate potassium-sparing diuretics   Olmesartan Other (See Comments)   Tape Other (See Comments)    Other reaction(s): Unknown   Tussionex Pennkinetic Er [Hydrocod Polst-Cpm Polst Er] Other (See Comments)    Other reaction(s): Unknown    Assessment/Plan:  1. Hyperlipidemia - Baseline LDL of 201 mg/dL is above goal <70 mg/dL given history of ASCVD. Pt is intolerant to 4 statins and ezetimibe. Pt is agreeable to starting Repatha 140 mg SQ every 2 weeks. Prior authorization approved through 07/28/21 (co-pay would be $45 for 30DS, $125 for 90DS). Patient approved for [place] to reduce co-pay cost to $0, he was provided with information to bring to the pharmacy. Educated patient on benefits of Repatha to lower LDL by 60%, proper administration and storage, and possible adverse effects. Educated patient on lifestyle modifications including Mediterranean diet and exercise. Follow up fasting lipid panel in 2-3 months. If LDL still above goal, consider addition of Nexletol.   Rebbeca Paul, PharmD PGY1 Pharmacy Resident 01/29/2021 10:27 AM   Jinny Blossom E. Supple, PharmD, BCACP, [place] [address] [place], [place] [address] Phone: [phone]; Fax: [phone] 01/29/2021 10:36 AM

## 2021-01-28 NOTE — Patient Instructions (Signed)
-Please continue to take your medications as directed. -Follow up with all specialist appointments. (Dr. Libby Maw office with the [facility] should be calling you to schedule as well). -Continue to exercise as tolerated, eat healthy high fiber low fat diet -Reschedule CPE with me to later this Summer as you just had labs done and have numerous specialty appointments coming up -Back to ER if any signs of stroke reoccur     Stroke Prevention Some medical conditions and behaviors are associated with a higher chance of having a stroke. You can help prevent a stroke by making nutrition, lifestyle, and other changes, including managing any medical conditions you may have. What nutrition changes can be made?  Eat healthy foods. You can do this by: ? Choosing foods high in fiber, such as fresh fruits and vegetables and whole grains. ? Eating at least 5 or more servings of fruits and vegetables a day. Try to fill half of your plate at each meal with fruits and vegetables. ? Choosing lean protein foods, such as lean cuts of meat, poultry without skin, fish, tofu, beans, and nuts. ? Eating low-fat dairy products. ? Avoiding foods that are high in salt (sodium). This can help lower blood pressure. ? Avoiding foods that have saturated fat, trans fat, and cholesterol. This can help prevent high cholesterol. ? Avoiding processed and premade foods.  Follow your health care provider's specific guidelines for losing weight, controlling high blood pressure (hypertension), lowering high cholesterol, and managing diabetes. These may include: ? Reducing your daily calorie intake. ? Limiting your daily sodium intake to 1,500 milligrams (mg). ? Using only healthy fats for cooking, such as olive oil, canola oil, or sunflower oil. ? Counting your daily carbohydrate intake.   What lifestyle changes can be made?  Maintain a healthy weight. Talk to your health care provider about your ideal weight.  Get at least  30 minutes of moderate physical activity at least 5 days a week. Moderate activity includes brisk walking, biking, and swimming.  Do not use any products that contain nicotine or tobacco, such as cigarettes and e-cigarettes. If you need help quitting, ask your health care provider. It may also be helpful to avoid exposure to secondhand smoke.  Limit alcohol intake to no more than 1 drink a day for nonpregnant women and 2 drinks a day for men. One drink equals 12 oz of beer, 5 oz of wine, or 1 oz of hard liquor.  Stop any illegal drug use.  Avoid taking birth control pills. Talk to your health care provider about the risks of taking birth control pills if: ? You are over 38 years old. ? You smoke. ? You get migraines. ? You have ever had a blood clot. What other changes can be made?  Manage your cholesterol levels. ? Eating a healthy diet is important for preventing high cholesterol. If cholesterol cannot be managed through diet alone, you may also need to take medicines. ? Take any prescribed medicines to control your cholesterol as told by your health care provider.  Manage your diabetes. ? Eating a healthy diet and exercising regularly are important parts of managing your blood sugar. If your blood sugar cannot be managed through diet and exercise, you may need to take medicines. ? Take any prescribed medicines to control your diabetes as told by your health care provider.  Control your hypertension. ? To reduce your risk of stroke, try to keep your blood pressure below 130/80. ? Eating a healthy diet and  exercising regularly are an important part of controlling your blood pressure. If your blood pressure cannot be managed through diet and exercise, you may need to take medicines. ? Take any prescribed medicines to control hypertension as told by your health care provider. ? Ask your health care provider if you should monitor your blood pressure at home. ? Have your blood pressure  checked every year, even if your blood pressure is normal. Blood pressure increases with age and some medical conditions.  Get evaluated for sleep disorders (sleep apnea). Talk to your health care provider about getting a sleep evaluation if you snore a lot or have excessive sleepiness.  Take over-the-counter and prescription medicines only as told by your health care provider. Aspirin or blood thinners (antiplatelets or anticoagulants) may be recommended to reduce your risk of forming blood clots that can lead to stroke.  Make sure that any other medical conditions you have, such as atrial fibrillation or atherosclerosis, are managed. What are the warning signs of a stroke? The warning signs of a stroke can be easily remembered as BEFAST.  B is for balance. Signs include: ? Dizziness. ? Loss of balance or coordination. ? Sudden trouble walking.  E is for eyes. Signs include: ? A sudden change in vision. ? Trouble seeing.  F is for face. Signs include: ? Sudden weakness or numbness of the face. ? The face or eyelid drooping to one side.  A is for arms. Signs include: ? Sudden weakness or numbness of the arm, usually on one side of the body.  S is for speech. Signs include: ? Trouble speaking (aphasia). ? Trouble understanding.  T is for time. ? These symptoms may represent a serious problem that is an emergency. Do not wait to see if the symptoms will go away. Get medical help right away. Call your local emergency services (911 in the [place] Do not drive yourself to the hospital.  Other signs of stroke may include: ? A sudden, severe headache with no known cause. ? Nausea or vomiting. ? Seizure. Where to find more information For more information, visit:  American Stroke Association: www.strokeassociation.org  National Stroke Association: www.stroke.org Summary  You can prevent a stroke by eating healthy, exercising, not smoking, limiting alcohol intake, and managing any  medical conditions you may have.  Do not use any products that contain nicotine or tobacco, such as cigarettes and e-cigarettes. If you need help quitting, ask your health care provider. It may also be helpful to avoid exposure to secondhand smoke.  Remember BEFAST for warning signs of stroke. Get help right away if you or a loved one has any of these signs. This information is not intended to replace advice given to you by your health care provider. Make sure you discuss any questions you have with your health care provider. Document Revised: 10/21/2017 Document Reviewed: 12/14/2016 Elsevier Patient Education  2021 [employer].

## 2021-01-29 ENCOUNTER — Encounter: Payer: Self-pay | Admitting: Nurse Practitioner

## 2021-01-29 ENCOUNTER — Ambulatory Visit: Payer: Medicare HMO | Admitting: Nurse Practitioner

## 2021-01-29 ENCOUNTER — Ambulatory Visit (INDEPENDENT_AMBULATORY_CARE_PROVIDER_SITE_OTHER): Payer: Medicare HMO | Admitting: Pharmacist

## 2021-01-29 VITALS — BP 120/78 | HR 80 | Ht 68.0 in | Wt 214.8 lb

## 2021-01-29 VITALS — Wt 215.0 lb

## 2021-01-29 DIAGNOSIS — D6869 Other thrombophilia: Secondary | ICD-10-CM

## 2021-01-29 DIAGNOSIS — I4819 Other persistent atrial fibrillation: Secondary | ICD-10-CM

## 2021-01-29 DIAGNOSIS — I639 Cerebral infarction, unspecified: Secondary | ICD-10-CM | POA: Diagnosis not present

## 2021-01-29 DIAGNOSIS — E782 Mixed hyperlipidemia: Secondary | ICD-10-CM

## 2021-01-29 DIAGNOSIS — G720 Drug-induced myopathy: Secondary | ICD-10-CM | POA: Insufficient documentation

## 2021-01-29 DIAGNOSIS — I10 Essential (primary) hypertension: Secondary | ICD-10-CM

## 2021-01-29 DIAGNOSIS — T466X5A Adverse effect of antihyperlipidemic and antiarteriosclerotic drugs, initial encounter: Secondary | ICD-10-CM

## 2021-01-29 MED ORDER — REPATHA SURECLICK 140 MG/ML ~~LOC~~ SOAJ
1.0000 | SUBCUTANEOUS | 3 refills | Status: DC
Start: 2021-01-29 — End: 2021-03-30

## 2021-01-29 NOTE — Patient Instructions (Signed)
Medication Instructions:  Your physician recommends that you continue on your current medications as directed. Please refer to the Current Medication list given to you today.  *If you need a refill on your cardiac medications before your next appointment, please call your pharmacy*   Lab Work: None Today If you have labs (blood work) drawn today and your tests are completely normal, you will receive your results only by: Marland Kitchen MyChart Message (if you have MyChart) OR . A paper copy in the mail If you have any lab test that is abnormal or we need to change your treatment, we will call you to review the results.   Follow-Up: At [facility], you and your health needs are our priority.  As part of our continuing mission to provide you with exceptional heart care, we have created designated Provider Care Teams.  These Care Teams include your primary Cardiologist (physician) and Advanced Practice Providers (APPs -  Physician Assistants and Nurse Practitioners) who all work together to provide you with the care you need, when you need it.  Your next appointment:   6 month(s)  The format for your next appointment:   In Person  Provider:   You may see [name], MD or one of the following Advanced Practice Providers on your designated Care Team:    Chanetta Marshall, NP  Tommye Standard, PA-C  Legrand Como "[place]" [place], [place]

## 2021-01-29 NOTE — Patient Instructions (Addendum)
It was nice to meet you today!  Your LDL cholesterol is 201 and your goal is < 70  Start Repatha injections once every 2 weeks in the fatty tissue of your abdomen. Store the medication in the fridge. This will lower your LDL by 60%  Recheck fasting cholesterol on Monday, May 23rd any time after 7:30am

## 2021-01-29 NOTE — Progress Notes (Signed)
Electrophysiology Office Note Date: 01/29/2021  ID:  Patrick Roth, DOB 02-14-1946, MRN [id number]  PCP: [name], PA-C Primary Cardiologist: [name] Electrophysiologist: [name]  CC: hospital follow up  Patrick Roth is a 75 y.o. male seen today for Dr [name] .  He presents today for routine electrophysiology followup.  Since last being seen in our clinic, the patient reports doing reasonably well.  He was admitted with CVA 12/2020.  He has no residual symptoms. There was no discussion about changing [place] that admission.  He has had some nausea in the mornings after taking medications.  He denies chest pain, palpitations, dyspnea, PND, orthopnea, vomiting, dizziness, syncope, edema, weight gain, or early satiety.  Past Medical History:  Diagnosis Date  . Atrial flutter ([place])   . Colon polyps   . Constipation 05/27/2015  . DOE (dyspnea on exertion) 10/13/2015  . Faintness 06/20/2016  . History of chicken pox   . Hyperlipemia 02/13/2016  . Hyperlipidemia   . Hypertension   . Hypokalemia 06/28/2016  . Hyponatremia 06/28/2016  . Internal hemorrhoids   . Irregular heartbeat   . Malignant hypertension 07/15/2015  . Sciatica   . Stroke [facility]) 2008  . Syncope and collapse 06/28/2016   Past Surgical History:  Procedure Laterality Date  . CARDIOVERSION N/A 09/02/2017   Procedure: CARDIOVERSION;  Surgeon: [name], MD;  Location: [facility] ENDOSCOPY;  Service: Cardiovascular;  Laterality: N/A;  . COLONOSCOPY  2011  . great toe surgery    . PROSTATE BIOPSY      Current Outpatient Medications  Medication Sig Dispense Refill  . acetaminophen (TYLENOL) 650 MG CR tablet Take 650 mg by mouth every 8 (eight) hours as needed for pain.    Marland Kitchen amLODipine (NORVASC) 10 MG tablet Take 10 mg by mouth daily.    . chlorthalidone (HYGROTON) 25 MG tablet Take 1 tablet by mouth once daily 90 tablet 0  . ELIQUIS 5 MG TABS tablet Take 1 tablet by mouth twice daily 60 tablet 5  . Evolocumab (REPATHA  SURECLICK) 673 MG/ML SOAJ Inject 1 pen into the skin every 14 (fourteen) days. 6 mL 3  . gabapentin (NEURONTIN) 100 MG capsule Take 1 capsule by mouth 4 times daily 360 capsule 0  . metoprolol succinate (TOPROL XL) 25 MG 24 hr tablet Take 1 tablet (25 mg total) by mouth daily. 90 tablet 3  . oxyCODONE-acetaminophen (PERCOCET/ROXICET) 5-325 MG tablet Take 1 tablet by mouth every 4 (four) hours as needed for severe pain. 12 tablet 0  . potassium chloride SA (KLOR-CON) 20 MEQ tablet TAKE 1 TABLET BY MOUTH IN THE MORNING AND 2 IN THE EVENING 270 tablet 1   No current facility-administered medications for this visit.    Allergies:   Crestor [rosuvastatin calcium], Spironolactone, Terazosin, Amiodarone, Atenolol, Bystolic [nebivolol hcl], Hydralazine, Tizanidine, Amlodipine, Clonidine, Hydrocodone-acetaminophen, Levofloxacin, Losartan, Maxzide [hydrochlorothiazide w-triamterene], Olmesartan, Tape, and Tussionex pennkinetic er [hydrocod polst-cpm polst er]   Social History: Social History   Socioeconomic History  . Marital status: Married    Spouse name: Not on file  . Number of children: 1  . Years of education: 28  . Highest education level: Not on file  Occupational History  . Occupation: Retired  Tobacco Use  . Smoking status: Never Smoker  . Smokeless tobacco: Never Used  Vaping Use  . Vaping Use: Never used  Substance and Sexual Activity  . Alcohol use: No  . Drug use: No  . Sexual activity: Not on  file  Other Topics Concern  . Not on file  Social History Narrative   Fun: Play golf and build furniture.    Social Determinants of Health   Financial Resource Strain: Low Risk   . Difficulty of Paying Living Expenses: Not hard at all  Food Insecurity: No Food Insecurity  . Worried About Charity fundraiser in the Last Year: Never true  . Ran Out of Food in the Last Year: Never true  Transportation Needs: No Transportation Needs  . Lack of Transportation (Medical): No  . Lack of  Transportation (Non-Medical): No  Physical Activity: Sufficiently Active  . Days of Exercise per Week: 7 days  . Minutes of Exercise per Session: 30 min  Stress: No Stress Concern Present  . Feeling of Stress : Not at all  Social Connections: Moderately Isolated  . Frequency of Communication with Friends and Family: More than three times a week  . Frequency of Social Gatherings with Friends and Family: Once a week  . Attends Religious Services: Never  . Active Member of Clubs or Organizations: No  . Attends Archivist Meetings: Never  . Marital Status: Married  Human resources officer Violence: Not At Risk  . Fear of Current or Ex-Partner: No  . Emotionally Abused: No  . Physically Abused: No  . Sexually Abused: No    Family History: Family History  Problem Relation Age of Onset  . Hypertension Father 31       Deceased  . Heart disease Father   . Lung cancer Mother 80       Deceased  . Healthy Sister        x3  . Healthy Brother        x4  . Heart disease Brother        #5  . Other Daughter        Alpha Thalassemia  . Lupus Daughter        #2-deceased  . Diabetes Neg Hx   . Heart attack Neg Hx   . Hyperlipidemia Neg Hx   . Sudden death Neg Hx     Review of Systems: All other systems reviewed and are otherwise negative except as noted above.   Physical Exam: VS:  BP 120/78   Pulse 80   Ht 5\' 8"  (1.727 m)   Wt 214 lb 12.8 oz (97.4 kg)   SpO2 97%   BMI 32.66 kg/m  , BMI Body mass index is 32.66 kg/m. Wt Readings from Last 3 Encounters:  01/29/21 214 lb 12.8 oz (97.4 kg)  01/29/21 215 lb (97.5 kg)  01/28/21 216 lb 3.2 oz (98.1 kg)    GEN- The patient is well appearing, alert and oriented x 3 today.   HEENT: normocephalic, atraumatic; sclera clear, conjunctiva pink; hearing intact; oropharynx clear; neck supple  Lungs- Clear to ausculation bilaterally, normal work of breathing.  No wheezes, rales, rhonchi Heart- Irregular rate and rhythm  GI- soft,  non-tender, non-distended, bowel sounds present  Extremities- no clubbing, cyanosis, or edema  MS- no significant deformity or atrophy Skin- warm and dry, no rash or lesion  Psych- euthymic mood, full affect Neuro- strength and sensation are intact   EKG:  EKG is not ordered today.  Recent Labs: 01/17/2021: ALT 30; BUN 12; Creatinine, Ser 1.38; Hemoglobin 18.7; Magnesium 1.9; Magnesium 1.8; Platelets 172; Potassium 3.9; Sodium 137; TSH 3.801    Other studies Reviewed: Additional studies/ records that were reviewed today include: Andy's note, hospital records  Assessment and Plan:  1.  Permanent atrial fibrillation Rate control planned  Continue Eliquis (see below)  2.  HTN Stable No change required today  3.  Recent CVA Hospital records reviewed Recommended continuing Eliquis Will ensure follow up with neurology Compliance encouraged  Lipids managed by lipid clinic    Current medicines are reviewed at length with the patient today.   The patient does not have concerns regarding his medicines.  The following changes were made today:  none  Labs/ tests ordered today include: none No orders of the defined types were placed in this encounter.    Disposition:   Follow up with Dr [name] 6 months     Signed, Chanetta Marshall, NP 01/29/2021 10:58 AM   [facility] [address] [place] [address] [phone] (office) [phone] (fax)

## 2021-01-30 ENCOUNTER — Telehealth: Payer: Self-pay | Admitting: Hematology and Oncology

## 2021-01-30 ENCOUNTER — Ambulatory Visit: Payer: Medicare HMO

## 2021-01-30 ENCOUNTER — Telehealth: Payer: Self-pay

## 2021-01-30 LAB — JAK2 EXONS 12-15

## 2021-01-30 LAB — JAK2  V617F QUAL. WITH REFLEX TO EXON 12: Reflex:: 15

## 2021-01-30 NOTE — Telephone Encounter (Signed)
Scheduled follow-up visit per 03/09 scheduled message, patient has been called and voicemail was left about upcoming appointment.

## 2021-01-30 NOTE — Chronic Care Management (AMB) (Signed)
    Chronic Care Management Pharmacy Assistant   Name: BIFF RUTIGLIANO  MRN: [id number] DOB: 07-21-1946  Reason for Encounter: Patient Assistance Documentation   Medications: Outpatient Encounter Medications as of 01/30/2021  Medication Sig  . acetaminophen (TYLENOL) 650 MG CR tablet Take 650 mg by mouth every 8 (eight) hours as needed for pain.  Marland Kitchen amLODipine (NORVASC) 10 MG tablet Take 10 mg by mouth daily.  . chlorthalidone (HYGROTON) 25 MG tablet Take 1 tablet by mouth once daily  . ELIQUIS 5 MG TABS tablet Take 1 tablet by mouth twice daily  . Evolocumab (REPATHA SURECLICK) 480 MG/ML SOAJ Inject 1 pen into the skin every 14 (fourteen) days.  Marland Kitchen gabapentin (NEURONTIN) 100 MG capsule Take 1 capsule by mouth 4 times daily  . metoprolol succinate (TOPROL XL) 25 MG 24 hr tablet Take 1 tablet (25 mg total) by mouth daily.  Marland Kitchen oxyCODONE-acetaminophen (PERCOCET/ROXICET) 5-325 MG tablet Take 1 tablet by mouth every 4 (four) hours as needed for severe pain.  . potassium chloride SA (KLOR-CON) 20 MEQ tablet TAKE 1 TABLET BY MOUTH IN THE MORNING AND 2 IN THE EVENING   No facility-administered encounter medications on file as of 01/30/2021.   Patient Assistance Application for medication Eliquis was not received by program [facility].   Patient states he forgot about this application and is not sure where he has placed it.  Patient to call back if he is not able to find this application so we can mail him a replacement form.  April D Calhoun, [place] Pharmacist Assistant [phone]

## 2021-02-03 ENCOUNTER — Emergency Department (HOSPITAL_BASED_OUTPATIENT_CLINIC_OR_DEPARTMENT_OTHER): Payer: Medicare HMO

## 2021-02-03 ENCOUNTER — Telehealth: Payer: Self-pay | Admitting: Cardiovascular Disease

## 2021-02-03 ENCOUNTER — Emergency Department (HOSPITAL_BASED_OUTPATIENT_CLINIC_OR_DEPARTMENT_OTHER)
Admission: EM | Admit: 2021-02-03 | Discharge: 2021-02-04 | Disposition: A | Discharge status: Home / Self Care | Payer: Medicare HMO | Attending: Emergency Medicine | Admitting: Emergency Medicine

## 2021-02-03 DIAGNOSIS — Z7901 Long term (current) use of anticoagulants: Secondary | ICD-10-CM | POA: Diagnosis not present

## 2021-02-03 DIAGNOSIS — Z862 Personal history of diseases of the blood and blood-forming organs and certain disorders involving the immune mechanism: Secondary | ICD-10-CM

## 2021-02-03 DIAGNOSIS — R531 Weakness: Secondary | ICD-10-CM | POA: Diagnosis not present

## 2021-02-03 DIAGNOSIS — R0602 Shortness of breath: Secondary | ICD-10-CM | POA: Diagnosis not present

## 2021-02-03 DIAGNOSIS — Z79899 Other long term (current) drug therapy: Secondary | ICD-10-CM | POA: Insufficient documentation

## 2021-02-03 DIAGNOSIS — H538 Other visual disturbances: Secondary | ICD-10-CM | POA: Diagnosis not present

## 2021-02-03 DIAGNOSIS — I639 Cerebral infarction, unspecified: Secondary | ICD-10-CM | POA: Diagnosis not present

## 2021-02-03 DIAGNOSIS — D751 Secondary polycythemia: Secondary | ICD-10-CM | POA: Diagnosis not present

## 2021-02-03 DIAGNOSIS — Z8673 Personal history of transient ischemic attack (TIA), and cerebral infarction without residual deficits: Secondary | ICD-10-CM

## 2021-02-03 DIAGNOSIS — R202 Paresthesia of skin: Secondary | ICD-10-CM | POA: Insufficient documentation

## 2021-02-03 DIAGNOSIS — I10 Essential (primary) hypertension: Secondary | ICD-10-CM | POA: Insufficient documentation

## 2021-02-03 LAB — CBC WITH DIFFERENTIAL/PLATELET
Abs Immature Granulocytes: 0.04 10*3/uL (ref 0.00–0.07)
Basophils Absolute: 0 10*3/uL (ref 0.0–0.1)
Basophils Relative: 1 %
Eosinophils Absolute: 0 10*3/uL (ref 0.0–0.5)
Eosinophils Relative: 1 %
HCT: 51.4 % (ref 39.0–52.0)
Hemoglobin: 16.8 g/dL (ref 13.0–17.0)
Immature Granulocytes: 1 %
Lymphocytes Relative: 30 %
Lymphs Abs: 1.6 10*3/uL (ref 0.7–4.0)
MCH: 29.2 pg (ref 26.0–34.0)
MCHC: 32.7 g/dL (ref 30.0–36.0)
MCV: 89.2 fL (ref 80.0–100.0)
Monocytes Absolute: 0.6 10*3/uL (ref 0.1–1.0)
Monocytes Relative: 12 %
Neutro Abs: 2.9 10*3/uL (ref 1.7–7.7)
Neutrophils Relative %: 55 %
Platelets: 184 10*3/uL (ref 150–400)
RBC: 5.76 MIL/uL (ref 4.22–5.81)
RDW: 14.1 % (ref 11.5–15.5)
WBC: 5.1 10*3/uL (ref 4.0–10.5)
nRBC: 0 % (ref 0.0–0.2)

## 2021-02-03 LAB — COMPREHENSIVE METABOLIC PANEL
ALT: 22 U/L (ref 0–44)
AST: 21 U/L (ref 15–41)
Albumin: 4.2 g/dL (ref 3.5–5.0)
Alkaline Phosphatase: 51 U/L (ref 38–126)
Anion gap: 10 (ref 5–15)
BUN: 16 mg/dL (ref 8–23)
CO2: 27 mmol/L (ref 22–32)
Calcium: 9.3 mg/dL (ref 8.9–10.3)
Chloride: 102 mmol/L (ref 98–111)
Creatinine, Ser: 1.44 mg/dL — ABNORMAL HIGH (ref 0.61–1.24)
GFR, Estimated: 50 mL/min — ABNORMAL LOW (ref >60)
Glucose, Bld: 117 mg/dL — ABNORMAL HIGH (ref 70–99)
Potassium: 3.7 mmol/L (ref 3.5–5.1)
Sodium: 139 mmol/L (ref 135–145)
Total Bilirubin: 1.5 mg/dL — ABNORMAL HIGH (ref 0.3–1.2)
Total Protein: 7.1 g/dL (ref 6.5–8.1)

## 2021-02-03 LAB — PROTIME-INR
INR: 1.1 (ref 0.8–1.2)
Prothrombin Time: 14.2 seconds (ref 11.4–15.2)

## 2021-02-03 NOTE — ED Notes (Signed)
Patient returned from Radiology. 

## 2021-02-03 NOTE — Telephone Encounter (Signed)
Called the pt to discuss the approval of the [employer] and sent the pt a confirmation email and we also 3way called the pharmacy and determined that they were not running it w/insurance and that brought the 90 day supply to just 118 and the grant is good for 2500 so pt should receive the medication free of charge for an entire year

## 2021-02-03 NOTE — ED Triage Notes (Signed)
Pt via POV, Pt reports having generalized weakness, SOB, and right leg pain. Pt reports having stroke approx 2 weeks ago. Respirations regular/unlabored. NAD. Pt able to speak full sentences. Ambulatory with steady gait to room 13.

## 2021-02-03 NOTE — ED Provider Notes (Signed)
[facility] EMERGENCY DEPT Provider Note   CSN: [id number] Arrival date & time: 02/03/21  2248     History Chief Complaint  Patient presents with  . Shortness of Breath  . Generalized Body Aches       . Leg Pain    HAVEN FOSS is a 75 y.o. male.  Patient is a 75 year old male with past medical history including chronic atrial flutter, hypertension, hyperlipidemia, and prior stroke.  Patient was admitted 2 weeks ago at [facility] after developing right-sided numbness and tingling.  CT scan showed stroke consistent with an embolic pattern.  He underwent studies with CTA of the head and neck and echocardiogram, all of which were unremarkable.  Patient has been taking Eliquis.  He also has a history of polycythemia.  This evening patient reports feeling short of breath, weak in his legs, and reports blurry vision.  He denies any fevers or chills.  He denies any chest pain.  The history is provided by the patient.  Shortness of Breath Severity:  Moderate Onset quality:  Gradual Timing:  Constant Progression:  Worsening Chronicity:  New Relieved by:  Nothing Worsened by:  Nothing Leg Pain      Past Medical History:  Diagnosis Date  . Atrial flutter ([place])   . Colon polyps   . Constipation 05/27/2015  . DOE (dyspnea on exertion) 10/13/2015  . Faintness 06/20/2016  . History of chicken pox   . Hyperlipemia 02/13/2016  . Hyperlipidemia   . Hypertension   . Hypokalemia 06/28/2016  . Hyponatremia 06/28/2016  . Internal hemorrhoids   . Irregular heartbeat   . Malignant hypertension 07/15/2015  . Sciatica   . Stroke [facility]) 2008  . Syncope and collapse 06/28/2016    Patient Active Problem List   Diagnosis Date Noted  . Statin myopathy 01/29/2021  . Acute arterial ischemic stroke, multifocal, anterior circulation, left ([place]) 01/16/2021  . Secondary hypercoagulable state ([place]) 09/11/2020  . Cyst of perineum in male 11/28/2018  . Mixed hyperlipidemia   . History of  chicken pox   . Colon polyps   . Atrial flutter with rapid ventricular response ([place])   . Other intervertebral disc degeneration, lumbar region 05/09/2017  . Lumbar radiculopathy 03/29/2017  . Persistent atrial fibrillation ([place]) 09/14/2016  . Diuretic-induced hypokalemia 06/28/2016  . Essential hypertension 05/27/2015  . Mallet deformity of fourth finger, right 07/11/2013  . Stroke [name]) 08/23/2012    Past Surgical History:  Procedure Laterality Date  . CARDIOVERSION N/A 09/02/2017   Procedure: CARDIOVERSION;  Surgeon: [name], MD;  Location: [facility] ENDOSCOPY;  Service: Cardiovascular;  Laterality: N/A;  . COLONOSCOPY  2011  . great toe surgery    . PROSTATE BIOPSY         Family History  Problem Relation Age of Onset  . Hypertension Father 25       Deceased  . Heart disease Father   . Lung cancer Mother 44       Deceased  . Healthy Sister        x3  . Healthy Brother        x4  . Heart disease Brother        #5  . Other Daughter        Alpha Thalassemia  . Lupus Daughter        #2-deceased  . Diabetes Neg Hx   . Heart attack Neg Hx   . Hyperlipidemia Neg Hx   . Sudden death Neg Hx  Social History   Tobacco Use  . Smoking status: Never Smoker  . Smokeless tobacco: Never Used  Vaping Use  . Vaping Use: Never used  Substance Use Topics  . Alcohol use: No  . Drug use: No    Home Medications Prior to Admission medications   Medication Sig Start Date End Date Taking? Authorizing Provider  acetaminophen (TYLENOL) 650 MG CR tablet Take 650 mg by mouth every 8 (eight) hours as needed for pain.    [provider]  amLODipine (NORVASC) 10 MG tablet Take 10 mg by mouth daily.    [provider]  chlorthalidone (HYGROTON) 25 MG tablet Take 1 tablet by mouth once daily 12/31/20   Midge Minium, MD  ELIQUIS 5 MG TABS tablet Take 1 tablet by mouth twice daily 08/11/20   Camnitz, Ocie Doyne, MD  Evolocumab (REPATHA SURECLICK) 283 MG/ML  SOAJ Inject 1 pen into the skin every 14 (fourteen) days. 01/29/21   Camnitz, Ocie Doyne, MD  gabapentin (NEURONTIN) 100 MG capsule Take 1 capsule by mouth 4 times daily 01/19/21   Allwardt, Alyssa M, PA-C  metoprolol succinate (TOPROL XL) 25 MG 24 hr tablet Take 1 tablet (25 mg total) by mouth daily. 01/07/21 01/07/22  Shirley Friar, PA-C  oxyCODONE-acetaminophen (PERCOCET/ROXICET) 5-325 MG tablet Take 1 tablet by mouth every 4 (four) hours as needed for severe pain. 01/24/20   Malvin Johns, MD  potassium chloride SA (KLOR-CON) 20 MEQ tablet TAKE 1 TABLET BY MOUTH IN THE MORNING AND 2 IN THE EVENING 08/08/20   Brunetta Jeans, PA-C    Allergies    Crestor [rosuvastatin calcium], Spironolactone, Terazosin, Amiodarone, Atenolol, Bystolic [nebivolol hcl], Hydralazine, Tizanidine, Amlodipine, Clonidine, Hydrocodone-acetaminophen, Levofloxacin, Losartan, Maxzide [hydrochlorothiazide w-triamterene], Olmesartan, Tape, and Tussionex pennkinetic er [hydrocod polst-cpm polst er]  Review of Systems   Review of Systems  Respiratory: Positive for shortness of breath.   All other systems reviewed and are negative.   Physical Exam Updated Vital Signs BP (!) 134/98   Pulse 95   Temp 98.2 F (36.8 C) (Oral)   Resp (!) 21   Ht 5\' 8"  (1.727 m)   Wt 95.3 kg   SpO2 99%   BMI 31.93 kg/m   Physical Exam Vitals and nursing note reviewed.  Constitutional:      General: He is not in acute distress.    Appearance: He is well-developed. He is not diaphoretic.  HENT:     Head: Normocephalic and atraumatic.  Eyes:     Extraocular Movements: Extraocular movements intact.     Pupils: Pupils are equal, round, and reactive to light.  Cardiovascular:     Rate and Rhythm: Normal rate and regular rhythm.     Heart sounds: No murmur heard. No friction rub.  Pulmonary:     Effort: Pulmonary effort is normal. No respiratory distress.     Breath sounds: Normal breath sounds. No wheezing or rales.   Abdominal:     General: Bowel sounds are normal. There is no distension.     Palpations: Abdomen is soft.     Tenderness: There is no abdominal tenderness.  Musculoskeletal:        General: Normal range of motion.     Cervical back: Normal range of motion and neck supple.  Skin:    General: Skin is warm and dry.  Neurological:     General: No focal deficit present.     Mental Status: He is alert and oriented to person, place, and  time.     Cranial Nerves: No cranial nerve deficit.     Motor: No weakness.     Coordination: Coordination normal.     ED Results / Procedures / Treatments   Labs (all labs ordered are listed, but only abnormal results are displayed) Labs Reviewed  COMPREHENSIVE METABOLIC PANEL  CBC WITH DIFFERENTIAL/PLATELET  BRAIN NATRIURETIC PEPTIDE  PROTIME-INR  TROPONIN I (HIGH SENSITIVITY)    EKG ED ECG REPORT   Date: 02/03/2021  Rate: 96  Rhythm: atrial flutter  QRS Axis: left  Intervals: normal  ST/T Wave abnormalities: nonspecific T wave changes  Conduction Disutrbances:right bundle branch block  Narrative Interpretation:   Old EKG Reviewed: none available  I have personally reviewed the EKG tracing and agree with the computerized printout as noted.      Radiology No results found.  Procedures Procedures   Medications Ordered in ED Medications - No data to display  ED Course  I have reviewed the triage vital signs and the nursing notes.  Pertinent labs & imaging results that were available during my care of the patient were reviewed by me and considered in my medical decision making (see chart for details).    MDM Rules/Calculators/A&P  Patient is a 75 year old male with history of recent stroke, polycythemia.  He presents today with generalized weakness and assorted complaints as described in the HPI.  Patient's physical examination is unremarkable and vital signs are stable.  He is neurologically intact.  Work-up initiated  including laboratory studies, CT of the head, chest x-ray, and EKG.  The studies have all returned essentially unremarkable.  At this point, I feel as though discharge is appropriate with outpatient follow-up.  Patient has upcoming appointments with both neurology and his primary doctor in the very near future.  I have instructed him to continue his Eliquis and return as needed if symptoms worsen.  Final Clinical Impression(s) / ED Diagnoses Final diagnoses:  None    Rx / DC Orders ED Discharge Orders    None       Veryl Speak, MD 02/04/21 [phone]

## 2021-02-03 NOTE — ED Notes (Signed)
Agree with triage note. Vitals taken. NAD. Pt placed in gown, connected to cardiac monitor, BP, and pulse ox. Stretcher low, wheels locked, call bell within reach. Wife at bedside.

## 2021-02-03 NOTE — ED Notes (Signed)
Patient transported to Radiology at this time.

## 2021-02-03 NOTE — Telephone Encounter (Signed)
Pt c/o medication issue:  1. Name of Medication: Evolocumab (REPATHA SURECLICK) 161 MG/ML SOAJ  2. How are you currently taking this medication (dosage and times per day)? n/a  3. Are you having a reaction (difficulty breathing--STAT)? no  4. What is your medication issue? Patient states that this is too expensive. He does not know if he can afford to take it. Please advise.

## 2021-02-04 ENCOUNTER — Telehealth: Payer: Self-pay | Admitting: Cardiology

## 2021-02-04 LAB — BRAIN NATRIURETIC PEPTIDE: B Natriuretic Peptide: 161.9 pg/mL — ABNORMAL HIGH (ref 0.0–100.0)

## 2021-02-04 LAB — TROPONIN I (HIGH SENSITIVITY): Troponin I (High Sensitivity): 12 ng/L (ref <18)

## 2021-02-04 NOTE — Telephone Encounter (Signed)
Patient c/o Palpitations:  High priority if patient c/o lightheadedness, shortness of breath, or chest pain  1) How long have you had palpitations/irregular HR/ Afib? Are you having the symptoms now? Has been occurring since before last stroke, is having symptoms now   2) Are you currently experiencing lightheadedness, SOB or CP? Brief SOB only when he walks   3) Do you have a history of afib (atrial fibrillation) or irregular heart rhythm? Yes   4) Have you checked your BP or HR? (document readings if available): 134/98 HR 105 last night at hospital   5) Are you experiencing any other symptoms? No   Patrick Roth is calling stating he was hospitalized last night. He states he is unsure if they checked to see if he is in Afib, but he feels he has been in Afib since his last stroke. He is wanting Dr. [name] to check to see if he is in Afib. Please advise.

## 2021-02-04 NOTE — Discharge Instructions (Addendum)
Continue medications as previously prescribed.  Follow-up with your primary doctor and neurologist as previously scheduled, and return to the ER symptoms significantly worsen or change.

## 2021-02-04 NOTE — Telephone Encounter (Signed)
Unable to leave a message on mobile number.  Voicemail is full. Called home number.  Left message for him to call back.  She said he will probably have to call back tomorrow, he will not be back before 5pm today.

## 2021-02-05 NOTE — Telephone Encounter (Signed)
Called patient back about his message. Patient has several questions about his A. FIB and his recent Hospital visit. Patient stated his HR and BP are usually lower than what they are running right now. Patient stated his HR is usually around 50's to 60's, now running in the 90's. Informed patient that he is taking the right medication for his A. FIB eliquis 5 mg BID and metoprolol for the rhythm. Informed patient maybe his medication just needs some adjusting. Patient stated he just feels tired and weak all the time. Informed patient this could be due to his a fib. Will have patient see PA to follow up after hospital visit and see if medications need adjusting. Patient agreed to plan.

## 2021-02-05 NOTE — Telephone Encounter (Signed)
Patient returning call from yesterday

## 2021-02-12 ENCOUNTER — Ambulatory Visit: Payer: Medicare HMO | Admitting: Physician Assistant

## 2021-02-12 ENCOUNTER — Encounter: Payer: Self-pay | Admitting: Physician Assistant

## 2021-02-12 ENCOUNTER — Ambulatory Visit (INDEPENDENT_AMBULATORY_CARE_PROVIDER_SITE_OTHER): Payer: Medicare HMO | Admitting: Physician Assistant

## 2021-02-12 ENCOUNTER — Other Ambulatory Visit: Payer: Self-pay

## 2021-02-12 VITALS — BP 126/80 | HR 61 | Temp 97.3°F | Ht 68.0 in | Wt 215.0 lb

## 2021-02-12 DIAGNOSIS — I10 Essential (primary) hypertension: Secondary | ICD-10-CM | POA: Diagnosis not present

## 2021-02-12 DIAGNOSIS — E782 Mixed hyperlipidemia: Secondary | ICD-10-CM | POA: Diagnosis not present

## 2021-02-12 DIAGNOSIS — D751 Secondary polycythemia: Secondary | ICD-10-CM | POA: Diagnosis not present

## 2021-02-12 DIAGNOSIS — Z0000 Encounter for general adult medical examination without abnormal findings: Secondary | ICD-10-CM

## 2021-02-12 DIAGNOSIS — M5416 Radiculopathy, lumbar region: Secondary | ICD-10-CM | POA: Diagnosis not present

## 2021-02-12 DIAGNOSIS — I4819 Other persistent atrial fibrillation: Secondary | ICD-10-CM

## 2021-02-12 NOTE — Patient Instructions (Signed)
Preventive Care 76 Years and Older, Male Preventive care refers to lifestyle choices and visits with your health care provider that can promote health and wellness. This includes:  A yearly physical exam. This is also called an annual wellness visit.  Regular dental and eye exams.  Immunizations.  Screening for certain conditions.  Healthy lifestyle choices, such as: ? Eating a healthy diet. ? Getting regular exercise. ? Not using drugs or products that contain nicotine and tobacco. ? Limiting alcohol use. What can I expect for my preventive care visit? Physical exam Your health care provider will check your:  Height and weight. These may be used to calculate your BMI (body mass index). BMI is a measurement that tells if you are at a healthy weight.  Heart rate and blood pressure.  Body temperature.  Skin for abnormal spots. Counseling Your health care provider may ask you questions about your:  Past medical problems.  Family's medical history.  Alcohol, tobacco, and drug use.  Emotional well-being.  Home life and relationship well-being.  Sexual activity.  Diet, exercise, and sleep habits.  History of falls.  Memory and ability to understand (cognition).  Work and work environment.  Access to firearms. What immunizations do I need? Vaccines are usually given at various ages, according to a schedule. Your health care provider will recommend vaccines for you based on your age, medical history, and lifestyle or other factors, such as travel or where you work.   What tests do I need? Blood tests  Lipid and cholesterol levels. These may be checked every 5 years, or more often depending on your overall health.  Hepatitis C test.  Hepatitis B test. Screening  Lung cancer screening. You may have this screening every year starting at age 55 if you have a 30-pack-year history of smoking and currently smoke or have quit within the past 15 years.  Colorectal  cancer screening. ? All adults should have this screening starting at age 50 and continuing until age 75. ? Your health care provider may recommend screening at age 45 if you are at increased risk. ? You will have tests every 1-10 years, depending on your results and the type of screening test.  Prostate cancer screening. Recommendations will vary depending on your family history and other risks.  Genital exam to check for testicular cancer or hernias.  Diabetes screening. ? This is done by checking your blood sugar (glucose) after you have not eaten for a while (fasting). ? You may have this done every 1-3 years.  Abdominal aortic aneurysm (AAA) screening. You may need this if you are a current or former smoker.  STD (sexually transmitted disease) testing, if you are at risk. Follow these instructions at home: Eating and drinking  Eat a diet that includes fresh fruits and vegetables, whole grains, lean protein, and low-fat dairy products. Limit your intake of foods with high amounts of sugar, saturated fats, and salt.  Take vitamin and mineral supplements as recommended by your health care provider.  Do not drink alcohol if your health care provider tells you not to drink.  If you drink alcohol: ? Limit how much you have to 0-2 drinks a day. ? Be aware of how much alcohol is in your drink. In the [place] one drink equals one 12 oz bottle of beer (355 mL), one 5 oz glass of wine (148 mL), or one 1 oz glass of hard liquor (44 mL).   Lifestyle  Take daily care of your teeth   and gums. Brush your teeth every morning and night with fluoride toothpaste. Floss one time each day.  Stay active. Exercise for at least 30 minutes 5 or more days each week.  Do not use any products that contain nicotine or tobacco, such as cigarettes, e-cigarettes, and chewing tobacco. If you need help quitting, ask your health care provider.  Do not use drugs.  If you are sexually active, practice safe sex.  Use a condom or other form of protection to prevent STIs (sexually transmitted infections).  Talk with your health care provider about taking a low-dose aspirin or statin.  Find healthy ways to cope with stress, such as: ? Meditation, yoga, or listening to music. ? Journaling. ? Talking to a trusted person. ? Spending time with friends and family. Safety  Always wear your seat belt while driving or riding in a vehicle.  Do not drive: ? If you have been drinking alcohol. Do not ride with someone who has been drinking. ? When you are tired or distracted. ? While texting.  Wear a helmet and other protective equipment during sports activities.  If you have firearms in your house, make sure you follow all gun safety procedures. What's next?  Visit your health care provider once a year for an annual wellness visit.  Ask your health care provider how often you should have your eyes and teeth checked.  Stay up to date on all vaccines. This information is not intended to replace advice given to you by your health care provider. Make sure you discuss any questions you have with your health care provider. Document Revised: 08/07/2019 Document Reviewed: 11/02/2018 Elsevier Patient Education  2021 Elsevier Inc.  

## 2021-02-12 NOTE — Progress Notes (Signed)
Cardiology Office Note Date:  02/12/2021  Patient ID:  Patrick Roth 29-Nov-1945, MRN [id number] PCP:  [name], PA-C  Cardiologist:  Dr. Acie Fredrickson Electrophysiologist: Dr. [name]   Chief Complaint: , AFib, HTN, post hospital   History of Present Illness: Patrick Roth is a 75 y.o. male with history of AFib, HTN, CVA,  HLD, syncope attributed to orthostasis in 2017, h/o medication non complinace, another stroke Feb 2022, polycythemia  He comes today to be seen for Dr. [name], last seen by him was jan 2021  He has followed with AFib clinic and EP APPs in interim, at his last vist at the [facility] clinic Nov 2021 he was in [place], prior Zio patch noted 100% AFib, the pt intolerant of amiodarone (self stopped making him feel cold and reported slow HRs).  He had also previously declined ablation strategy. He was started on Toprol the visit prior and spontaneously had conversion to SR apparently Pt felt the BB made him feel poorly, and did not feel bad in AF   Most recently saw A. Lynnell Jude, NP 01/29/21, he was post stroke, review of his hospital record noted recommendation to continue Eliquis. AFib was planned for rate control (some notes describe permanent)and 26mo follow up  He went to the ER 02/03/21 w/CC of SOB, generalized body aches and leg pain EKG described as Aflutter 96bpm, no stemi, RBBB, VSS Work-up including laboratory studies, CT of the head, chest x-ray, and EKG.  "have all returned essentially unremarkable", he was discharged from the ER, with no changes to his medicines.   TODAY He is doing OK, generally fatigued, naps during the day, feels like he sleeps well at night Says all in all he feels like he is happy with his AFib control/symptoms He does note that his HR tends to either be 50's or 90's but denies to me any particular symptoms with either. When he thinks he is in AFib he feels like has a slight sense of a change in his breathing, though not overtly SOB No  palpitations, no CP and no real cardiac awareness He denies symptoms of bradycardia, no dizzy spells, near syncope or syncope.  No bleeding or signs of bleeding  He feels like it took a long time to get to here where he feels pretty decent  He went to the ER he says because his BP was unusually elevated 140's/110 and went in to get checked worried about having another stroke     AFib Hx Diagnosed 2017  AAD hx Started March 2019 >> seems self stopped sometime in the Fall 2021 2/2 making him feel cold and lowered his pulse  Past Medical History:  Diagnosis Date   Atrial flutter ([place])    Colon polyps    Constipation 05/27/2015   DOE (dyspnea on exertion) 10/13/2015   Faintness 06/20/2016   History of chicken pox    Hyperlipemia 02/13/2016   Hyperlipidemia    Hypertension    Hypokalemia 06/28/2016   Hyponatremia 06/28/2016   Internal hemorrhoids    Irregular heartbeat    Malignant hypertension 07/15/2015   Sciatica    Stroke [facility]) 2008   Syncope and collapse 06/28/2016    Past Surgical History:  Procedure Laterality Date   CARDIOVERSION N/A 09/02/2017   Procedure: CARDIOVERSION;  Surgeon: [name], MD;  Location: [facility] ENDOSCOPY;  Service: Cardiovascular;  Laterality: N/A;   COLONOSCOPY  2011   great toe surgery     PROSTATE BIOPSY  Current Outpatient Medications  Medication Sig Dispense Refill   acetaminophen (TYLENOL) 650 MG CR tablet Take 650 mg by mouth every 8 (eight) hours as needed for pain.     amLODipine (NORVASC) 10 MG tablet Take 10 mg by mouth daily.     chlorthalidone (HYGROTON) 25 MG tablet Take 1 tablet by mouth once daily 90 tablet 0   ELIQUIS 5 MG TABS tablet Take 1 tablet by mouth twice daily 60 tablet 5   Evolocumab (REPATHA SURECLICK) 790 MG/ML SOAJ Inject 1 pen into the skin every 14 (fourteen) days. 6 mL 3   gabapentin (NEURONTIN) 100 MG capsule Take 1 capsule by mouth 4 times daily 360 capsule 0   metoprolol succinate  (TOPROL XL) 25 MG 24 hr tablet Take 1 tablet (25 mg total) by mouth daily. 90 tablet 3   oxyCODONE-acetaminophen (PERCOCET/ROXICET) 5-325 MG tablet Take 1 tablet by mouth every 4 (four) hours as needed for severe pain. 12 tablet 0   potassium chloride SA (KLOR-CON) 20 MEQ tablet TAKE 1 TABLET BY MOUTH IN THE MORNING AND 2 IN THE EVENING 270 tablet 1   No current facility-administered medications for this visit.    Allergies:   Crestor [rosuvastatin calcium], Spironolactone, Terazosin, Amiodarone, Atenolol, Bystolic [nebivolol hcl], Hydralazine, Tizanidine, Amlodipine, Clonidine, Hydrocodone-acetaminophen, Levofloxacin, Losartan, Maxzide [hydrochlorothiazide w-triamterene], Olmesartan, Tape, and Tussionex pennkinetic er [hydrocod polst-cpm polst er]   Social History:  The patient  reports that he has never smoked. He has never used smokeless tobacco. He reports that he does not drink alcohol and does not use drugs.   Family History:  The patient's family history includes Healthy in his brother and sister; Heart disease in his brother and father; Hypertension (age of onset: 73) in his father; Lung cancer (age of onset: 61) in his mother; Lupus in his daughter; Other in his daughter.  ROS:  Please see the history of present illness.  All other systems are reviewed and otherwise negative.   PHYSICAL EXAM:  VS:  There were no vitals taken for this visit. BMI: There is no height or weight on file to calculate BMI. Well nourished, well developed, in no acute distress  HEENT: normocephalic, atraumatic  Neck: no JVD, carotid bruits or masses Cardiac: RRR,bradycardic; no significant murmurs, no rubs, or gallops Lungs:  CTA b/l, no wheezing, rhonchi or rales  Abd: soft, nontender MS: no deformity or atrophy Ext:  no edema  Skin: warm and dry, no rash Neuro:  No gross deficits appreciated Psych: euthymic mood, full affect   EKG:  Done today and reviewed by myself SB 55bpm, RBBB, 1st degree  AVblock 246ms, LAD  subsequent EKGs are flutter, variable rate control  10/20/20: SB 55bpm, variable PR (but not long), RBBB, LAD 08/21/20, 09/11/20, 09/25/20 are Aflutter  11/27/2019: SB 56bpm, 1st degree Avblock, PR 281ms, RBBB, LAD (ON AMIO)  05/04/17 SB, 51bpm PACs, RBBB, T changes lat Done 04/25/17 likely AFlutter 134bpm, RBBB 04/23/17 atypical appearing Aflutter, 64bpm, RBBB 04/23/17 AFib/flutter 91bpm, RBBB 12/16/16 SB, 53bpm, RBBB, LAFB  Nov 2021 Monitoring Max 215 bpm 02:28pm, 10/28 Min 82 bpm 11:16am, 10/28 Avg 112 bpm 3.3% PVC burden, rare supraventricular ectopy Atrial fibrillation/flutter occurred 100% of the time Longest ventricular run 10 seconds at 161 bpm No symptoms recorded     02/24/2018: stress myoview  Nuclear stress EF: 58%.  Normal perfusion. No ischemia  This is a low risk study.     02/24/2018: TTE Study Conclusions  - Left ventricle: The cavity size was  normal. Systolic function was  normal. The estimated ejection fraction was in the range of 55%  to 60%. Wall motion was normal; there were no regional wall  motion abnormalities. There was a reduced contribution of atrial  contraction to ventricular filling, due to increased ventricular  diastolic pressure or atrial contractile dysfunction. The study  is not technically sufficient to allow evaluation of LV diastolic  function. (suspect left atrial mechanical failure, possible  stunning after recent episode of atrial fibrillation versus  chronic atrial dysfunction)  - Left atrium: The atrium was mildly dilated.  - Right ventricle: The cavity size was mildly dilated. Wall  thickness was normal.  - Right atrium: The atrium was mildly dilated.  - Pulmonary arteries: Systolic pressure was moderately increased.  PA peak pressure: 60 mm Hg (S).   6./15/18: 48 hor holter Minimum HR: 37 BPM at 12:18:54 PM Maximum HR: 124 BPM at 7:11:26 PM Average HR: 50 BPM 1% ventricular beats 4%  supraventricular beats Ventricular runs of 6 beats Supraventricular runs of 13 beats  Zero atrial fibrillaiton noted on monitor. Average HR 50. No med changes at this time. Will [name], MD  10/01/16 TTE - Left ventricle: The cavity size was normal. Systolic function was normal. The estimated ejection fraction was in the range of 55% to 60%. Wall motion was normal; there were no regional wall motion abnormalities. Doppler parameters are consistent with a reversible restrictive pattern, indicative of decreased left ventricular diastolic compliance and/or increased left atrial pressure (grade 3 diastolic dysfunction). Doppler parameters are consistent with high ventricular filling pressure. - Aortic valve: Transvalvular velocity was within the normal range. There was no stenosis. - Mitral valve: Transvalvular velocity was within the normal range. There was no evidence for stenosis. There was mild regurgitation. - Right ventricle: The cavity size was mildly dilated. Wall thickness was normal. Systolic function was normal. - Atrial septum: No defect or patent foramen ovale was identified by color flow Doppler. - Tricuspid valve: There was mild regurgitation. - Pulmonary arteries: Systolic pressure was moderately to severely increased. PA peak pressure: 57 mm Hg (S). - Pericardium, extracardiac: A trivial pericardial effusion was identified.  10/10/15: lexiscan stress myoview  There was no ST segment deviation noted during stress.  No T wave inversion was noted during stress.  The study is normal.  This is a low risk study.  Low risk stress nuclear study with normal perfusion. Non gated study.   Recent Labs: 01/17/2021: Magnesium 1.9; Magnesium 1.8; TSH 3.801 02/03/2021: ALT 22; B Natriuretic Peptide 161.9; BUN 16; Creatinine, Ser 1.44; Hemoglobin 16.8; Platelets 184; Potassium 3.7; Sodium 139  01/17/2021: Cholesterol 271; HDL 59; LDL Cholesterol 201;  Total CHOL/HDL Ratio 4.6; Triglycerides 55; VLDL 11   Estimated Creatinine Clearance: 48.9 mL/min (A) (by C-G formula based on SCr of 1.44 mg/dL (H)).   Wt Readings from Last 3 Encounters:  02/03/21 210 lb (95.3 kg)  01/29/21 214 lb 12.8 oz (97.4 kg)  01/29/21 215 lb (97.5 kg)     Other studies reviewed: Additional studies/records reviewed today include: summarized above  ASSESSMENT AND PLAN:  1.  AFib      He is in sinus today has had some SR/SB EKS g over the past year or so, notes by various practitioners have described persistent and permanant      Mentions for rate control strategy ?      CHA2DS2Vasc is 5, on Eliquis, appropriately dosed       He has baseline conduction system disease  trifascicular block and SB at baseline Would NOT push rate controlling meds, or resume Amio  I spoke to the patient that he may one day need a pacer, though given he feels OK and to date, rates in his AFlutter seem generally 90's-100's and no symptoms of bradycardia, did notthink he needed pacing currently  I would like hism to see Dr. [name], perhaps in 2-3 months, sooner if needed   2. HTN     No changes  3.  H/o Medicine non-compliance      He reports good compliance with his medicines, and no missed doses of his Eliquis prior to  His stroke         Disposition: as above   Current medicines are reviewed at length with the patient today.  The patient did not have any concerns regarding medicines.  Haywood Lasso, PA-C 02/12/2021 8:18 AM     [facility] [place] [address] [phone] (office)  [phone] (fax)

## 2021-02-12 NOTE — Progress Notes (Signed)
Established Patient Office Visit  Subjective:  Patient ID: Patrick Roth, male    DOB: [birth year]  Age: 75 y.o. MRN: [id number]  CC:  Chief Complaint  Patient presents with  . Annual Exam    HPI Patrick Roth presents for annual CPE.   Acute concerns: Pulse has been going up and down, feels fatigued more often when this is happening. He has called his cardiologist and will see them tomorrow. They informed him via phone call that some of his medications may need to be adjusted.  Health maintenance: Lifestyle/ exercise: Yard work and walking  Nutrition: Wife does most of the cooking, tries to limit salt  Mental health: No concerns  Caffeine: None Sleep: Better than he used to be; does get up about 2x per night to urinate; no longer has nightmares Substance use: None Sexual activity: Not currently, married Immunizations: Does not take flu shots or pneumonia vaccines. Tdap due 07/06/23. Considering Shingles vaccine. Colonoscopy: Colonoscopy done at [facility] about three months ago   Past Medical History:  Diagnosis Date  . Atrial flutter ([place])   . Colon polyps   . Constipation 05/27/2015  . DOE (dyspnea on exertion) 10/13/2015  . Faintness 06/20/2016  . History of chicken pox   . Hyperlipemia 02/13/2016  . Hyperlipidemia   . Hypertension   . Hypokalemia 06/28/2016  . Hyponatremia 06/28/2016  . Internal hemorrhoids   . Irregular heartbeat   . Malignant hypertension 07/15/2015  . Sciatica   . Stroke [facility]) 2008  . Syncope and collapse 06/28/2016    Past Surgical History:  Procedure Laterality Date  . CARDIOVERSION N/A 09/02/2017   Procedure: CARDIOVERSION;  Surgeon: [name], MD;  Location: [facility] ENDOSCOPY;  Service: Cardiovascular;  Laterality: N/A;  . COLONOSCOPY  2011  . great toe surgery    . PROSTATE BIOPSY      Family History  Problem Relation Age of Onset  . Hypertension Father 42       Deceased  . Heart disease Father   . Lung cancer Mother 20        Deceased  . Healthy Sister        x3  . Healthy Brother        x4  . Heart disease Brother        #5  . Other Daughter        Alpha Thalassemia  . Lupus Daughter        #2-deceased  . Diabetes Neg Hx   . Heart attack Neg Hx   . Hyperlipidemia Neg Hx   . Sudden death Neg Hx     Social History   Socioeconomic History  . Marital status: Married    Spouse name: Not on file  . Number of children: 1  . Years of education: 39  . Highest education level: Not on file  Occupational History  . Occupation: Retired  Tobacco Use  . Smoking status: Never Smoker  . Smokeless tobacco: Never Used  Vaping Use  . Vaping Use: Never used  Substance and Sexual Activity  . Alcohol use: No  . Drug use: No  . Sexual activity: Not on file  Other Topics Concern  . Not on file  Social History Narrative   Fun: Play golf and build furniture.    Social Determinants of Health   Financial Resource Strain: Low Risk   . Difficulty of Paying Living Expenses: Not hard at all  Food Insecurity: No Food Insecurity  .  Worried About Charity fundraiser in the Last Year: Never true  . Ran Out of Food in the Last Year: Never true  Transportation Needs: No Transportation Needs  . Lack of Transportation (Medical): No  . Lack of Transportation (Non-Medical): No  Physical Activity: Sufficiently Active  . Days of Exercise per Week: 7 days  . Minutes of Exercise per Session: 30 min  Stress: No Stress Concern Present  . Feeling of Stress : Not at all  Social Connections: Moderately Isolated  . Frequency of Communication with Friends and Family: More than three times a week  . Frequency of Social Gatherings with Friends and Family: Once a week  . Attends Religious Services: Never  . Active Member of Clubs or Organizations: No  . Attends Archivist Meetings: Never  . Marital Status: Married  Human resources officer Violence: Not At Risk  . Fear of Current or Ex-Partner: No  . Emotionally Abused: No  .  Physically Abused: No  . Sexually Abused: No    Outpatient Medications Prior to Visit  Medication Sig Dispense Refill  . acetaminophen (TYLENOL) 650 MG CR tablet Take 650 mg by mouth every 8 (eight) hours as needed for pain.    Marland Kitchen amLODipine (NORVASC) 10 MG tablet Take 10 mg by mouth daily.    . chlorthalidone (HYGROTON) 25 MG tablet Take 1 tablet by mouth once daily 90 tablet 0  . ELIQUIS 5 MG TABS tablet Take 1 tablet by mouth twice daily 60 tablet 5  . Evolocumab (REPATHA SURECLICK) 258 MG/ML SOAJ Inject 1 pen into the skin every 14 (fourteen) days. 6 mL 3  . gabapentin (NEURONTIN) 100 MG capsule Take 1 capsule by mouth 4 times daily 360 capsule 0  . metoprolol succinate (TOPROL XL) 25 MG 24 hr tablet Take 1 tablet (25 mg total) by mouth daily. 90 tablet 3  . oxyCODONE-acetaminophen (PERCOCET/ROXICET) 5-325 MG tablet Take 1 tablet by mouth every 4 (four) hours as needed for severe pain. 12 tablet 0  . potassium chloride SA (KLOR-CON) 20 MEQ tablet TAKE 1 TABLET BY MOUTH IN THE MORNING AND 2 IN THE EVENING 270 tablet 1   No facility-administered medications prior to visit.    Allergies  Allergen Reactions  . Crestor [Rosuvastatin Calcium] Shortness Of Breath    Muscle cramps  . Spironolactone Shortness Of Breath  . Terazosin Shortness Of Breath, Palpitations and Other (See Comments)    Nerve pain  . Amiodarone Other (See Comments)    Dropped pulse real low to 45-50, and made him feel very cold  . Atenolol Other (See Comments)    Drowsiness and "flu sxs"  . Bystolic [Nebivolol Hcl] Other (See Comments)    GI issues  . Hydralazine Other (See Comments)    Joint swelling  . Tizanidine Other (See Comments)    Syncope, Elevated BP/pulse  . Amlodipine Other (See Comments)  . Clonidine Other (See Comments)  . Hydrocodone-Acetaminophen Other (See Comments)  . Levofloxacin Other (See Comments)  . Losartan Other (See Comments)    Did not work.  Pt states he couldn't take b/c of SE, but  can't remember what SE were.  . Maxzide [Hydrochlorothiazide W-Triamterene] Other (See Comments)    Does not tolerate potassium-sparing diuretics  . Olmesartan Other (See Comments)  . Tape Other (See Comments)    Other reaction(s): Unknown  . Tussionex Pennkinetic Er [Hydrocod Polst-Cpm Polst Er] Other (See Comments)    Other reaction(s): Unknown    ROS Review of  Systems  Constitutional: Negative for activity change, appetite change and unexpected weight change.  HENT: Negative for congestion.   Eyes: Negative for visual disturbance.  Respiratory: Negative for apnea and shortness of breath.   Cardiovascular: Negative for chest pain.  Gastrointestinal: Negative for abdominal pain and blood in stool.  Endocrine: Negative for polydipsia, polyphagia and polyuria.  Genitourinary: Negative for difficulty urinating.  Musculoskeletal: Negative for arthralgias.  Skin: Negative for rash.  Neurological: Negative for seizures, weakness and headaches.  Psychiatric/Behavioral: Negative for sleep disturbance and suicidal ideas.      Objective:    Physical Exam Vitals and nursing note reviewed.  Constitutional:      General: He is not in acute distress.    Appearance: Normal appearance. He is not toxic-appearing.  HENT:     Head: Normocephalic and atraumatic.     Right Ear: Tympanic membrane, ear canal and external ear normal.     Left Ear: Tympanic membrane, ear canal and external ear normal.     Nose: Nose normal.     Mouth/Throat:     Mouth: Mucous membranes are moist.     Pharynx: Oropharynx is clear.  Eyes:     Extraocular Movements: Extraocular movements intact.     Conjunctiva/sclera: Conjunctivae normal.     Pupils: Pupils are equal, round, and reactive to light.  Cardiovascular:     Rate and Rhythm: Normal rate. Rhythm irregular.     Pulses: Normal pulses.     Heart sounds: Normal heart sounds.  Pulmonary:     Effort: Pulmonary effort is normal.     Breath sounds: Normal  breath sounds.  Abdominal:     General: Abdomen is flat. Bowel sounds are normal.     Palpations: Abdomen is soft.     Tenderness: There is no abdominal tenderness.  Musculoskeletal:        General: Normal range of motion.     Cervical back: Normal range of motion and neck supple.  Skin:    General: Skin is warm and dry.  Neurological:     General: No focal deficit present.     Mental Status: He is alert and oriented to person, place, and time.  Psychiatric:        Mood and Affect: Mood normal.        Behavior: Behavior normal.     BP 126/80   Pulse 61   Temp (!) 97.3 F (36.3 C)   Ht 5' 8" (1.727 m)   Wt 215 lb (97.5 kg)   SpO2 96%   BMI 32.69 kg/m  Wt Readings from Last 3 Encounters:  02/12/21 215 lb (97.5 kg)  02/03/21 210 lb (95.3 kg)  01/29/21 214 lb 12.8 oz (97.4 kg)     There are no preventive care reminders to display for this patient.  There are no preventive care reminders to display for this patient.  Lab Results  Component Value Date   TSH 3.801 01/17/2021   Lab Results  Component Value Date   WBC 5.1 02/03/2021   HGB 16.8 02/03/2021   HCT 51.4 02/03/2021   MCV 89.2 02/03/2021   PLT 184 02/03/2021   Lab Results  Component Value Date   NA 139 02/03/2021   K 3.7 02/03/2021   CO2 27 02/03/2021   GLUCOSE 117 (H) 02/03/2021   BUN 16 02/03/2021   CREATININE 1.44 (H) 02/03/2021   BILITOT 1.5 (H) 02/03/2021   ALKPHOS 51 02/03/2021   AST 21 02/03/2021   ALT  22 02/03/2021   PROT 7.1 02/03/2021   ALBUMIN 4.2 02/03/2021   CALCIUM 9.3 02/03/2021   ANIONGAP 10 02/03/2021   GFR 58.48 (L) 10/27/2020   Lab Results  Component Value Date   CHOL 271 (H) 01/17/2021   Lab Results  Component Value Date   HDL 59 01/17/2021   Lab Results  Component Value Date   LDLCALC 201 (H) 01/17/2021   Lab Results  Component Value Date   TRIG 55 01/17/2021   Lab Results  Component Value Date   CHOLHDL 4.6 01/17/2021   Lab Results  Component Value Date    HGBA1C 5.8 (H) 01/17/2021      Assessment & Plan:   Problem List Items Addressed This Visit      Cardiovascular and Mediastinum   Essential hypertension   Persistent atrial fibrillation ([place])     Other   Mixed hyperlipidemia    Other Visit Diagnoses    Encounter for annual physical exam    -  Primary   Lumbar radiculopathy, chronic       Polycythemia          No orders of the defined types were placed in this encounter.   Follow-up: Return in about 1 year (around 02/12/2022) for Annual CPE .   1. Encounter for annual physical exam Age-appropriate screening and counseling performed today. Handout provided - see AVS. reviewed most recent labs and do not see need to repeat at this time.  2. Essential hypertension Stable.  Takes Norvasc 10 mg and chlorthalidone 25 mg daily.  3. Mixed hyperlipidemia He has met with the lipid clinic on 01/29/2021.  He was willing to try PCSK9 inhibitor therapy, but is concerned about the cost.  He will continue follow-up with them and decide moving forward if he wants to proceed or not.  4. Persistent atrial fibrillation [facility]) He will meet with his cardiologist tomorrow.  He is taking his Eliquis and metoprolol.  5. Lumbar radiculopathy, chronic Follows up with Dr. Arrie Eastern.  6. Polycythemia Appt with Dr. Lorenso Courier coming up soon.  This note was prepared with assistance of Systems analyst. Occasional wrong-word or sound-a-like substitutions may have occurred due to the inherent limitations of voice recognition software.   Lanetra Hartley M Eliah Marquard, PA-C

## 2021-02-13 ENCOUNTER — Ambulatory Visit: Payer: Medicare HMO | Admitting: Physician Assistant

## 2021-02-13 ENCOUNTER — Encounter: Payer: Self-pay | Admitting: Physician Assistant

## 2021-02-13 VITALS — BP 136/78 | HR 60 | Ht 68.0 in | Wt 218.0 lb

## 2021-02-13 DIAGNOSIS — I10 Essential (primary) hypertension: Secondary | ICD-10-CM

## 2021-02-13 DIAGNOSIS — I4819 Other persistent atrial fibrillation: Secondary | ICD-10-CM

## 2021-02-13 DIAGNOSIS — I484 Atypical atrial flutter: Secondary | ICD-10-CM | POA: Diagnosis not present

## 2021-02-13 NOTE — Patient Instructions (Signed)
Medication Instructions:   Your physician recommends that you continue on your current medications as directed. Please refer to the Current Medication list given to you today.  *If you need a refill on your cardiac medications before your next appointment, please call your pharmacy*   Lab Work: [place]   If you have labs (blood work) drawn today and your tests are completely normal, you will receive your results only by: Marland Kitchen MyChart Message (if you have MyChart) OR . A paper copy in the mail If you have any lab test that is abnormal or we need to change your treatment, we will call you to review the results.   Testing/Procedures: NONE ORDERED  TODAY    Follow-Up: At [facility], you and your health needs are our priority.  As part of our continuing mission to provide you with exceptional heart care, we have created designated Provider Care Teams.  These Care Teams include your primary Cardiologist (physician) and Advanced Practice Providers (APPs -  Physician Assistants and Nurse Practitioners) who all work together to provide you with the care you need, when you need it.  We recommend signing up for the patient portal called "MyChart".  Sign up information is provided on this After Visit Summary.  MyChart is used to connect with patients for Virtual Visits (Telemedicine).  Patients are able to view lab/test results, encounter notes, upcoming appointments, etc.  Non-urgent messages can be sent to your provider as well.   To learn more about what you can do with MyChart, go to [email or web address].    Your next appointment:   3 month(s)  The format for your next appointment:   In Person  Provider:   Allegra Lai, MD ONLY (CONTACT [facility] FOR ANY ASSISTANCE NEEDED FOR EP SCHEDULING)   Other Instructions

## 2021-02-17 ENCOUNTER — Other Ambulatory Visit: Payer: Self-pay | Admitting: Hematology and Oncology

## 2021-02-17 DIAGNOSIS — D709 Neutropenia, unspecified: Secondary | ICD-10-CM

## 2021-02-17 NOTE — Progress Notes (Signed)
[place] [phone]   [phone]  PROGRESS NOTE  Patient Care Team: Allwardt, [name], PA-C as PCP - General (Physician Assistant) Constance Haw, MD as PCP - Electrophysiology (Cardiology) [name], Wonda Cheng, MD as PCP - Cardiology (Cardiology) Irene Shipper, MD as Consulting Physician (Gastroenterology) [name], Wonda Cheng, MD as Consulting Physician (Cardiology) Christy Sartorius, MD as Referring Physician (Urology) Inocencio Homes, DPM as Consulting Physician (Podiatry) Myrle Sheng, MD as Referring Physician (Neurosurgery) Begovich, Geradine Girt, DO (Sports Medicine) [name], [facility] as Pharmacist (Pharmacist)  Hematological/Oncological History #Leukopenia with Relative Monocytosis and Neutropenia #Polycythemia #Sarcoidosis in the Bone Marrow  1) 08/03/2017: WBC 4.3, Hgb 16.4, Plt 164 2) 08/11/2018: WBC 3.0, Hgb 18.1, Plt 176. ANC 1.2 3) 10/23/2019: WBC 2.7, Hgb 17.3, Plt 160. ANC 1.2. 20.9% Monocytes (Total Monocytes 600) 4) 11/14/2019: establish care with Dr. Lorenso Courier. WBC 2.6, Hgb 16.9, Plt 160. ANC 1200 5) 01/02/2020: WBC 2.8,  Hgb 16.4, MCV 86.5, Plt 164 6) 01/15/2020: Bone marrow biopsy performed which revealed normocellular marrow with mild megakaryocytic dysplasia and noncaseating granulomas (consistent with sarcoidosis).  7) 02/27/2020: CT C/A/P shows no evidence of sarcoid elsewhere in the body.   Interval History:  Patrick Roth 75 y.o. male with medical history significant for neutropenia and polycythemia presents for a follow up visit. The patient's last visit was on 05/15/2020. In the interim since the last visit, patient was admitted in February 2022 due to scattered multifocal strokes int he left MCA territory, most likely cardioembolic in the setting of known atrial flutter.   On exam today Patrick Roth notes that is he feeling well with stable appetite and energy levels. He continues to complete all his ADLs on his own. He  denies any nausea, vomiting or abdominal pain. He denies any changes to his bowel habits. He reports neuropathy in the R lower extremity without any gait abnormalities. He denies any fevers, chills, sweats, shortness of breath, chest pain or cough. He has no other complaints. A full 10 point ROS is listed below.  MEDICAL HISTORY:  Past Medical History:  Diagnosis Date  . Atrial flutter ([place])   . Colon polyps   . Constipation 05/27/2015  . DOE (dyspnea on exertion) 10/13/2015  . Faintness 06/20/2016  . History of chicken pox   . Hyperlipemia 02/13/2016  . Hyperlipidemia   . Hypertension   . Hypokalemia 06/28/2016  . Hyponatremia 06/28/2016  . Internal hemorrhoids   . Irregular heartbeat   . Malignant hypertension 07/15/2015  . Sciatica   . Stroke [facility]) 2008  . Syncope and collapse 06/28/2016   ALLERGIES:  is allergic to crestor [rosuvastatin calcium], spironolactone, terazosin, amiodarone, atenolol, bystolic [nebivolol hcl], hydralazine, tizanidine, amlodipine, clonidine, hydrocodone-acetaminophen, levofloxacin, losartan, maxzide [hydrochlorothiazide w-triamterene], olmesartan, tape, and tussionex pennkinetic er [employer] er].  MEDICATIONS:  Current Outpatient Medications  Medication Sig Dispense Refill  . acetaminophen (TYLENOL) 650 MG CR tablet Take 650 mg by mouth every 8 (eight) hours as needed for pain.    Marland Kitchen amLODipine (NORVASC) 10 MG tablet Take 10 mg by mouth daily.    . chlorthalidone (HYGROTON) 25 MG tablet Take 1 tablet by mouth once daily 90 tablet 0  . ELIQUIS 5 MG TABS tablet Take 1 tablet by mouth twice daily 60 tablet 5  . Evolocumab (REPATHA SURECLICK) 212 MG/ML SOAJ Inject 1 pen into the skin every 14 (fourteen) days. 6 mL 3  . gabapentin (NEURONTIN) 100 MG capsule Take 1 capsule by mouth  4 times daily 360 capsule 0  . metoprolol succinate (TOPROL XL) 25 MG 24 hr tablet Take 1 tablet (25 mg total) by mouth daily. 90 tablet 3  . oxyCODONE-acetaminophen  (PERCOCET/ROXICET) 5-325 MG tablet Take 1 tablet by mouth every 4 (four) hours as needed for severe pain. 12 tablet 0  . potassium chloride SA (KLOR-CON) 20 MEQ tablet TAKE 1 TABLET BY MOUTH IN THE MORNING AND 2 IN THE EVENING 270 tablet 1   No current facility-administered medications for this visit.    REVIEW OF SYSTEMS:   Constitutional: ( - ) fevers, ( - )  chills , ( - ) night sweats Eyes: ( - ) blurriness of vision, ( - ) double vision, ( - ) watery eyes Ears, nose, mouth, throat, and face: ( - ) mucositis, ( - ) sore throat Respiratory: ( - ) cough, ( - ) dyspnea, ( - ) wheezes Cardiovascular: ( - ) palpitation, ( - ) chest discomfort, ( - ) lower extremity swelling Gastrointestinal:  ( - ) nausea, ( - ) heartburn, ( - ) change in bowel habits Skin: ( - ) abnormal skin rashes Lymphatics: ( - ) new lymphadenopathy, ( - ) easy bruising Neurological: ( + ) numbness, ( - ) tingling, ( - ) new weaknesses Behavioral/Psych: ( - ) mood change, ( - ) new changes  All other systems were reviewed with the patient and are negative.  PHYSICAL EXAMINATION: ECOG PERFORMANCE STATUS: 1 - Symptomatic but completely ambulatory  Vitals:   02/18/21 1121  BP: 135/85  Pulse: (!) 58  Resp: 16  Temp: 97.9 F (36.6 C)  SpO2: 98%   Filed Weights   02/18/21 1121  Weight: 219 lb (99.3 kg)    GENERAL: well appearing elderly African American male in NAD  SKIN: skin color, texture, turgor are normal, no rashes or significant lesions. Lipoma on right lower portion of neck.  EYES: conjunctiva are pink and non-injected, sclera clear LUNGS: clear to auscultation and percussion with normal breathing effort HEART: regular rate & rhythm and no murmurs and no lower extremity edema ABDOMEN: soft, non-tender, non-distended, normal bowel sounds Musculoskeletal: no cyanosis of digits and no clubbing  PSYCH: alert & oriented x 3, fluent speech NEURO: no focal motor/sensory deficits  LABORATORY DATA:  I have  reviewed the data as listed CBC Latest Ref Rng & Units 02/03/2021 01/17/2021 01/16/2021  WBC 4.0 - 10.5 K/uL 5.1 6.5 6.2  Hemoglobin 13.0 - 17.0 g/dL 16.8 18.7(H) 17.6(H)  Hematocrit 39.0 - 52.0 % 51.4 57.3(H) 53.1(H)  Platelets 150 - 400 K/uL 184 172 178    CMP Latest Ref Rng & Units 02/03/2021 01/17/2021 01/16/2021  Glucose 70 - 99 mg/dL 117(H) 92 93  BUN 8 - 23 mg/dL 16 12 14   Creatinine 0.61 - 1.24 mg/dL 1.44(H) 1.38(H) 1.31(H)  Sodium 135 - 145 mmol/L 139 137 137  Potassium 3.5 - 5.1 mmol/L 3.7 3.9 3.2(L)  Chloride 98 - 111 mmol/L 102 100 98  CO2 22 - 32 mmol/L 27 27 29   Calcium 8.9 - 10.3 mg/dL 9.3 9.6 9.0  Total Protein 6.5 - 8.1 g/dL 7.1 7.6 7.4  Total Bilirubin 0.3 - 1.2 mg/dL 1.5(H) 3.2(H) 1.8(H)  Alkaline Phos 38 - 126 U/L 51 71 61  AST 15 - 41 U/L 21 28 28   ALT 0 - 44 U/L 22 30 30      BLOOD FILM:  From prior visit 01/01/2019: Review of the peripheral blood smear showed normal appearing white cells (  though markedly decreased in number) with neutrophils that were appropriately lobated and granulated. There was no predominance of bi-lobed or hyper-segmented neutrophils appreciated. No Dohle bodies were noted. There was no left shifting, immature forms or blasts noted. Lymphocytes remain normal in size without any predominance of large granular lymphocytes. Red cells show no anisopoikilocytosis, macrocytes , microcytes or polychromasia. There were no schistocytes, target cells, echinocytes, acanthocytes, dacrocytes, or stomatocytes.There was no rouleaux formation, nucleated red cells, or intra-cellular inclusions noted. The platelets are normal in size, shape, and color without any clumping evident.  RADIOGRAPHIC STUDIES: None relevant to review.  CT Head Wo Contrast  Result Date: 02/03/2021 CLINICAL DATA:  Cerebral hemorrhage suspected Generalized weakness.  Patient reports having a stroke 2 weeks ago. EXAM: CT HEAD WITHOUT CONTRAST TECHNIQUE: Contiguous axial images were obtained  from the base of the skull through the vertex without intravenous contrast. COMPARISON:  Head CT and brain MRI 01/16/2021 FINDINGS: Brain: No intracranial hemorrhage. No subdural or extra-axial collection. The small acute infarcts in the left frontal parietal lobes on prior MRI are not well seen by CT. Previously described remote lacunar infarct in the left thalamus and right basal ganglia on prior MRI are not well seen. No evidence of acute or territorial ischemia. Stable brain volume. Right basal gangliar mineralization is unchanged. Vascular: Skull base atherosclerosis.  No hyperdense vessel. Skull: No fracture or focal lesion. Sinuses/Orbits: Paranasal sinuses and mastoid air cells are clear. The visualized orbits are unremarkable. Other: None. IMPRESSION: 1. No acute intracranial abnormality.  No intracranial hemorrhage. 2. The small acute infarcts on prior MRI are not well seen by CT. Electronically Signed   By: Keith Rake M.D.   On: 02/03/2021 23:50   DG Chest Port 1 View  Result Date: 02/03/2021 CLINICAL DATA:  Weakness and shortness of breath with right leg pain. EXAM: PORTABLE CHEST 1 VIEW COMPARISON:  January 16, 2021 FINDINGS: The heart size and mediastinal contours are within normal limits. Both lungs are clear. Degenerative changes seen throughout the thoracic spine. IMPRESSION: Stable exam without acute cardiopulmonary disease. Electronically Signed   By: Virgina Norfolk M.D.   On: 02/03/2021 23:47    ASSESSMENT & PLAN Patrick Roth 75 y.o. male with medical history significant for stroke, HTN, HLD, and atrial fibrillation on apixaban who presents for f/u of leukopenia with relative monocytosis.   After the previous visit no etiology was found for the patient's neutropenia.  Bone marrow biopsy has been performed in the interim which shows non because eating granulomas consistent with sarcoidosis of the bone marrow.  Also of note the patient had a normocellular marrow with mild  megakaryocytic dysplasia.  These findings are nonspecific, though it is possible that the patient has bone marrow involvement of sarcoidosis which is driving his leukopenia.  Prior imaging including ultrasound of the abdomen as well as chest x-ray have shown no concerning imaging, but most of these are several years old.  After discussion [place] conference we decided to pursue a CT scan in order to determine if there is sarcoidosis elsewhere, predominantly we are concerned about the lungs. CT scan performed on 02/27/2020 showed no evidence of sarcoid in the C/A/P. There was a 5 mm nodule noted in the lungs with 12 month interval imaging recommended.   Today, patient is doing well without any significant limitations. Labs from 02/03/21 were reviewed that included a normal CBC with differential. Recommend to continue to monitor with routine labs and return to the clinic in 1 year.   #  Leukopenia with Relative Monocytosis and Neutropenia #Polycythemia, Likely Secondary  #Sarcoidosis Involvement of the Bone Marrow --bone marrow biopsy shows normocellular marrow with mild megakaryocytic dysplasia as well as noncaseating granulomas. These findings are concerning for sarcoidosis infiltration of the bone marrow.  --Labs from 02/03/21 included WBC was 5.1 and ANC 2900 --continue to monitor his blood counts. No clear indication for treatment of sarcoidosis of the marrow at this time as it appears stable.  --CT imaging on 02/27/2020 of the C/A/P showed no evidence of sarcoidosis elsewhere.  --RTC in 1 year to repeat bloodwork  #Elevated PSA --patient is currently being evaluated by urology. --MRI prostate performed yesterday shows BPH but no discrete lesions --defer to urology, though we can assist if carcinoma of the prostate is found.   No orders of the defined types were placed in this encounter.  All questions were answered. The patient knows to call the clinic with any problems, questions or concerns.  A  total of more than 30 minutes were spent on this encounter and over half of that time was spent on counseling and coordination of care as outlined above.   Lincoln Brigham Department of Hematology/Oncology [facility] Phone: [phone]   I have read the above note and personally examined the patient. I agree with the assessment and plan as noted above.  Ledell Peoples, MD Department of Hematology/Oncology [place] [facility] Phone: [phone] Pager: [phone] Email: [email or web address]   02/18/2021 12:49 PM

## 2021-02-18 ENCOUNTER — Inpatient Hospital Stay: Payer: Medicare HMO | Attending: Hematology and Oncology | Admitting: Hematology and Oncology

## 2021-02-18 ENCOUNTER — Other Ambulatory Visit: Payer: Self-pay

## 2021-02-18 ENCOUNTER — Ambulatory Visit: Payer: Medicare HMO | Admitting: Student

## 2021-02-18 VITALS — BP 135/85 | HR 58 | Temp 97.9°F | Resp 16 | Ht 68.0 in | Wt 219.0 lb

## 2021-02-18 DIAGNOSIS — Z79899 Other long term (current) drug therapy: Secondary | ICD-10-CM | POA: Insufficient documentation

## 2021-02-18 DIAGNOSIS — D709 Neutropenia, unspecified: Secondary | ICD-10-CM

## 2021-02-18 DIAGNOSIS — D869 Sarcoidosis, unspecified: Secondary | ICD-10-CM

## 2021-02-18 DIAGNOSIS — N400 Enlarged prostate without lower urinary tract symptoms: Secondary | ICD-10-CM | POA: Insufficient documentation

## 2021-02-18 DIAGNOSIS — D72819 Decreased white blood cell count, unspecified: Secondary | ICD-10-CM | POA: Diagnosis not present

## 2021-02-18 DIAGNOSIS — D751 Secondary polycythemia: Secondary | ICD-10-CM | POA: Insufficient documentation

## 2021-02-18 DIAGNOSIS — I10 Essential (primary) hypertension: Secondary | ICD-10-CM | POA: Insufficient documentation

## 2021-02-18 DIAGNOSIS — I4891 Unspecified atrial fibrillation: Secondary | ICD-10-CM | POA: Insufficient documentation

## 2021-02-18 DIAGNOSIS — D708 Other neutropenia: Secondary | ICD-10-CM

## 2021-02-18 DIAGNOSIS — Z8673 Personal history of transient ischemic attack (TIA), and cerebral infarction without residual deficits: Secondary | ICD-10-CM | POA: Diagnosis not present

## 2021-02-18 DIAGNOSIS — Z7901 Long term (current) use of anticoagulants: Secondary | ICD-10-CM | POA: Diagnosis not present

## 2021-02-19 DIAGNOSIS — R972 Elevated prostate specific antigen [PSA]: Secondary | ICD-10-CM | POA: Diagnosis not present

## 2021-02-20 ENCOUNTER — Other Ambulatory Visit: Payer: Self-pay | Admitting: Cardiology

## 2021-02-20 ENCOUNTER — Telehealth: Payer: Self-pay | Admitting: Hematology and Oncology

## 2021-02-20 NOTE — Telephone Encounter (Signed)
Eliquis 5mg  refill request received. Patient is 75 years old, weight-99.3kg, Crea-1.44 on 02/03/2021, Diagnosis-Afib, and last seen by Tommye Standard on 02/13/2021. Dose is appropriate based on dosing criteria. Will send in refill to requested pharmacy.

## 2021-02-20 NOTE — Telephone Encounter (Signed)
Scheduled per los. Called and left msg. Mailed printout  °

## 2021-02-23 ENCOUNTER — Telehealth: Payer: Self-pay

## 2021-02-23 ENCOUNTER — Other Ambulatory Visit: Payer: Self-pay

## 2021-02-23 MED ORDER — POTASSIUM CHLORIDE CRYS ER 20 MEQ PO TBCR: EXTENDED_RELEASE_TABLET | ORAL | 1 refills | Status: DC

## 2021-02-23 NOTE — Telephone Encounter (Signed)
MEDICATION: potassium chloride SA (KLOR-CON) 20 MEQ tablet  PHARMACY: [facility] [address]  Comments:   **Let patient know to contact pharmacy at the end of the day to make sure medication is ready. **  ** Please notify patient to allow 48-72 hours to process**  **Encourage patient to contact the pharmacy for refills or they can request refills through [facility]**

## 2021-02-23 NOTE — Telephone Encounter (Signed)
Rx sent in

## 2021-02-24 ENCOUNTER — Encounter: Payer: Self-pay | Admitting: Internal Medicine

## 2021-02-25 DIAGNOSIS — M7712 Lateral epicondylitis, left elbow: Secondary | ICD-10-CM | POA: Diagnosis not present

## 2021-02-26 DIAGNOSIS — M5412 Radiculopathy, cervical region: Secondary | ICD-10-CM | POA: Diagnosis not present

## 2021-02-26 DIAGNOSIS — N5201 Erectile dysfunction due to arterial insufficiency: Secondary | ICD-10-CM | POA: Diagnosis not present

## 2021-02-26 DIAGNOSIS — N401 Enlarged prostate with lower urinary tract symptoms: Secondary | ICD-10-CM | POA: Diagnosis not present

## 2021-02-26 DIAGNOSIS — M5136 Other intervertebral disc degeneration, lumbar region: Secondary | ICD-10-CM | POA: Diagnosis not present

## 2021-02-26 DIAGNOSIS — R351 Nocturia: Secondary | ICD-10-CM | POA: Diagnosis not present

## 2021-02-26 DIAGNOSIS — M5416 Radiculopathy, lumbar region: Secondary | ICD-10-CM | POA: Diagnosis not present

## 2021-03-04 ENCOUNTER — Ambulatory Visit (INDEPENDENT_AMBULATORY_CARE_PROVIDER_SITE_OTHER): Payer: Medicare HMO

## 2021-03-04 DIAGNOSIS — I4819 Other persistent atrial fibrillation: Secondary | ICD-10-CM | POA: Diagnosis not present

## 2021-03-04 DIAGNOSIS — E782 Mixed hyperlipidemia: Secondary | ICD-10-CM | POA: Diagnosis not present

## 2021-03-04 DIAGNOSIS — I10 Essential (primary) hypertension: Secondary | ICD-10-CM | POA: Diagnosis not present

## 2021-03-04 NOTE — Progress Notes (Signed)
Chronic Care Management Pharmacy Note  03/04/2021 Name:  Patrick Roth MRN:  [id number] DOB:  [birth year]  Subjective: Patrick Roth is an 75 y.o. year old male who is a primary patient of Allwardt, [name], PA-C.  The CCM team was consulted for assistance with disease management and care coordination needs.    Engaged with patient by telephone for follow up visit in response to provider referral for pharmacy case management and/or care coordination services.   Consent to Services:  The patient was given information about Chronic Care Management services, agreed to services, and gave verbal consent prior to initiation of services.  Please see initial visit note for detailed documentation.   Patient Care Team: Allwardt, [name], PA-C as PCP - General (Physician Assistant) Constance Haw, MD as PCP - Electrophysiology (Cardiology) [name], Wonda Cheng, MD as PCP - Cardiology (Cardiology) Irene Shipper, MD as Consulting Physician (Gastroenterology) [name], Wonda Cheng, MD as Consulting Physician (Cardiology) Christy Sartorius, MD as Referring Physician (Urology) Inocencio Homes, [place] as Consulting Physician (Podiatry) Myrle Sheng, MD as Referring Physician (Neurosurgery) Begovich, Geradine Girt, DO (Sports Medicine) [name], [facility] as Pharmacist (Pharmacist)  ED 02/03/2021 due to recent stroke. No medication changes.    Objective: BMP Latest Ref Rng & Units 02/03/2021 01/17/2021 01/16/2021  Glucose 70 - 99 mg/dL 117(H) 92 93  BUN 8 - 23 mg/dL 16 12 14   Creatinine 0.61 - 1.24 mg/dL 1.44(H) 1.38(H) 1.31(H)  BUN/Creat Ratio 10 - 24 - - -  Sodium 135 - 145 mmol/L 139 137 137  Potassium 3.5 - 5.1 mmol/L 3.7 3.9 3.2(L)  Chloride 98 - 111 mmol/L 102 100 98  CO2 22 - 32 mmol/L 27 27 29   Calcium 8.9 - 10.3 mg/dL 9.3 9.6 9.0   Lab Results  Component Value Date   CREATININE 1.44 (H) 02/03/2021   CREATININE 1.38 (H) 01/17/2021   CREATININE 1.31 (H) 01/16/2021    Lab Results   Component Value Date   HGBA1C 5.8 (H) 01/17/2021   Last diabetic Eye exam: No results found for: HMDIABEYEEXA  Last diabetic Foot exam: No results found for: HMDIABFOOTEX      Component Value Date/Time   CHOL 271 (H) 01/17/2021 0036   CHOL 263 (H) 04/20/2019 0919   TRIG 55 01/17/2021 0036   HDL 59 01/17/2021 0036   HDL 45 04/20/2019 0919   CHOLHDL 4.6 01/17/2021 0036   VLDL 11 01/17/2021 0036   LDLCALC 201 (H) 01/17/2021 0036   LDLCALC 197 (H) 04/20/2019 0919    Hepatic Function Latest Ref Rng & Units 02/03/2021 01/17/2021 01/16/2021  Total Protein 6.5 - 8.1 g/dL 7.1 7.6 7.4  Albumin 3.5 - 5.0 g/dL 4.2 3.9 4.0  AST 15 - 41 U/L 21 28 28   ALT 0 - 44 U/L 22 30 30   Alk Phosphatase 38 - 126 U/L 51 71 61  Total Bilirubin 0.3 - 1.2 mg/dL 1.5(H) 3.2(H) 1.8(H)  Bilirubin, Direct 0.00 - 0.40 mg/dL - - -    Lab Results  Component Value Date/Time   TSH 3.801 01/17/2021 12:36 AM   TSH 1.640 08/21/2020 11:03 AM   TSH 0.936 11/14/2019 10:58 AM   TSH 2.400 04/20/2019 09:19 AM    CBC Latest Ref Rng & Units 02/03/2021 01/17/2021 01/16/2021  WBC 4.0 - 10.5 K/uL 5.1 6.5 6.2  Hemoglobin 13.0 - 17.0 g/dL 16.8 18.7(H) 17.6(H)  Hematocrit 39.0 - 52.0 % 51.4 57.3(H) 53.1(H)  Platelets 150 - 400 K/uL 184 172  178   No results found for: VD25OH  Clinical ASCVD: Yes  The ASCVD Risk score Mikey Bussing DC Jr., et al., 2013) failed to calculate for the following reasons:   The patient has a prior MI or stroke diagnosis    Social History   Tobacco Use  Smoking Status Never Smoker  Smokeless Tobacco Never Used   BP Readings from Last 3 Encounters:  02/18/21 135/85  02/13/21 136/78  02/12/21 126/80   Pulse Readings from Last 3 Encounters:  02/18/21 (!) 58  02/13/21 60  02/12/21 61   Wt Readings from Last 3 Encounters:  02/18/21 219 lb (99.3 kg)  02/13/21 218 lb (98.9 kg)  02/12/21 215 lb (97.5 kg)   Assessment: Review of patient past medical history, allergies, medications, health status,  including review of consultants reports, laboratory and other test data, was performed as part of comprehensive evaluation and provision of chronic care management services.   SDOH:  (Social Determinants of Health) assessments and interventions performed: Yes.   CCM Care Plan  Allergies  Allergen Reactions  . Crestor [Rosuvastatin Calcium] Shortness Of Breath    Muscle cramps  . Spironolactone Shortness Of Breath  . Terazosin Shortness Of Breath, Palpitations and Other (See Comments)    Nerve pain  . Amiodarone Other (See Comments)    Dropped pulse real low to 45-50, and made him feel very cold  . Atenolol Other (See Comments)    Drowsiness and "flu sxs"  . Bystolic [Nebivolol Hcl] Other (See Comments)    GI issues  . Hydralazine Other (See Comments)    Joint swelling  . Tizanidine Other (See Comments)    Syncope, Elevated BP/pulse  . Amlodipine Other (See Comments)  . Clonidine Other (See Comments)  . Hydrocodone-Acetaminophen Other (See Comments)  . Levofloxacin Other (See Comments)  . Losartan Other (See Comments)    Did not work.  Pt states he couldn't take b/c of SE, but can't remember what SE were.  . Maxzide [Hydrochlorothiazide W-Triamterene] Other (See Comments)    Does not tolerate potassium-sparing diuretics  . Olmesartan Other (See Comments)  . Tape Other (See Comments)    Other reaction(s): Unknown  . Tussionex Pennkinetic Er [Hydrocod Polst-Cpm Polst Er] Other (See Comments)    Other reaction(s): Unknown    Medications Reviewed Today    Reviewed by [name], [facility] (Pharmacist) on 03/04/21 at [place] List Status: <None>  Medication Order Taking? Sig Documenting Provider Last Dose Status Informant  acetaminophen (TYLENOL) 650 MG CR tablet [id number] Yes Take 650 mg by mouth every 8 (eight) hours as needed for pain. [provider]  Active   amLODipine (NORVASC) 10 MG tablet [id number] Yes Take 10 mg by mouth daily. [provider] Taking  Active   chlorthalidone (HYGROTON) 25 MG tablet [id number] Yes Take 1 tablet by mouth once daily Midge Minium, MD Taking Active   ELIQUIS 5 MG TABS tablet [id number] Yes Take 1 tablet by mouth twice daily Camnitz, Will Hassell Done, MD Taking Active   Evolocumab [facility] SURECLICK) 767 MG/ML Darden Palmer [id number] Yes Inject 1 pen into the skin every 14 (fourteen) days. Constance Haw, MD Taking Active   gabapentin (NEURONTIN) 100 MG capsule [id number] Yes Take 1 capsule by mouth 4 times daily Allwardt, Alyssa M, PA-C Taking Active   metoprolol succinate (TOPROL XL) 25 MG 24 hr tablet [id number] Yes Take 1 tablet (25 mg total) by mouth daily. Shirley Friar, PA-C  Active   oxyCODONE-acetaminophen (PERCOCET/ROXICET) 5-325 MG  tablet [id number] Yes Take 1 tablet by mouth every 4 (four) hours as needed for severe pain. Malvin Johns, MD  Active            Med Note Lanna Poche R   Thu Aug 21, 2020 10:19 AM)    potassium chloride SA (KLOR-CON) 20 MEQ tablet [id number] Yes TAKE 1 TABLET BY MOUTH IN THE MORNING AND 2 IN THE EVENING Allwardt, [name], PA-C  Active           Patient Active Problem List   Diagnosis Date Noted  . Statin myopathy 01/29/2021  . Acute arterial ischemic stroke, multifocal, anterior circulation, left ([place]) 01/16/2021  . Secondary hypercoagulable state ([place]) 09/11/2020  . Cyst of perineum in male 11/28/2018  . Mixed hyperlipidemia   . History of chicken pox   . Colon polyps   . Atrial flutter with rapid ventricular response ([place])   . Other intervertebral disc degeneration, lumbar region 05/09/2017  . Lumbar radiculopathy 03/29/2017  . Persistent atrial fibrillation ([place]) 09/14/2016  . Diuretic-induced hypokalemia 06/28/2016  . Essential hypertension 05/27/2015  . Mallet deformity of fourth finger, right 07/11/2013  . Stroke [facility]) 08/23/2012    Immunization History  Administered Date(s) Administered  . Influenza,inj,Quad PF,6+ Mos 07/23/2015  .  Influenza-Unspecified 07/23/2018  . PFIZER(Purple Top)SARS-COV-2 Vaccination 12/24/2019, 01/21/2020, 09/06/2020  . Tdap 07/05/2013    Conditions to be addressed/monitored: Atrial Fibrillation, HTN and HLD  Care Plan : [place]  Updates made by [name], [facility] since 03/04/2021 12:00 AM    Problem: HLD HTN AFib   Priority: High    Long-Range Goal: Patient-Specific Goal   Start Date: 03/04/2021  Expected End Date: 03/04/2022  This Visit's Progress: On track  Priority: High  Note:   Current Barriers:  . Unable to maintain control of HLD - now on [place]):  Marland Kitchen Patient will verbalize ability to afford treatment regimen . contact provider office for questions/concerns as evidenced notation of same in electronic health record through collaboration with PharmD and provider.   Interventions: . 1:1 collaboration with Allwardt, [name], PA-C regarding development and update of comprehensive plan of care as evidenced by provider attestation and co-signature . Inter-disciplinary care team collaboration (see longitudinal plan of care) . Comprehensive medication review performed; medication list updated in electronic medical record . No medication changes  Hypertension (BP goal <140/90) -Controlled -Current treatment:  Amlodipine 10 mg daily  Chlorthalidone 25 mg daily  -Denies issues with potassium tolerability, able to take 20 meq tablets 3x/day without a problem.   -Current home readings: when symptomatic  -Current dietary habits: no specific diet. 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 Admits to not doing the best with hydradtion. -Denies hypotensive/hypertensive symptoms -Educated on BP goals and benefits of medications for prevention of heart attack, stroke and kidney damage; -Counseled to monitor BP at home, document, and provide log at future appointments -Recommended to continue  current medication Counseled on staying hydrated especially during times of intense physical activity  Hyperlipidemia: (LDL goal < 70) -Not ideally controlled per 12/2020 labs  -Repatha start 01/2021 -Current treatment: . Repatha 140 mg injection every 14 days (01/2021) -Medications previously tried: statin intolerant.  -Reports that everything is taken care of from a cost standpoint through lipid clinic. Will not be responsible for OOP spending due to approval of [employer] grant.  -Educated on Cholesterol goals;  -Recommended to continue current medication  Atrial Fibrillation (Goal:  prevent stroke and major bleeding) -Controlled -Current treatment: . Rate control: Metoprolol succinate 25 mg once daily . Anticoagulation:  Eliquis 5 mg twice daily  -Denies costs concerns with Eliquis, currently about $40 dollars/month. Not wanting to pursue patient assistance at this time -Home BP and HR readings: n/a  -Counseled on increased risk of stroke due to Afib and benefits of anticoagulation for stroke prevention; importance of adherence to anticoagulant exactly as prescribed; -Recommended to continue current medication, again emphasized adequate water intake.  Patient Goals/Self-Care Activities . Patient will:  - take medications as prescribed  Follow Up Plan: [facility] telephone f/u 4-6 months Medication Assistance: None required.  Patient affirms current coverage meets needs.  Patient's preferred pharmacy is:  [facility] [address], [place] - [address] [place] [place] [address] Phone: [phone] Fax: [phone]  Follow Up:  Patient agrees to Care Plan and Follow-up.     Future Appointments  Date Time Provider [place]  03/25/2021  9:00 AM Garvin Fila, MD [facility] None  04/13/2021 10:45 AM [facility]  05/19/2021  9:45 AM [name], Ocie Doyne, MD [facility]  07/31/2021 11:00 AM Allwardt,  [name], PA-C [facility]  09/07/2021  1:00 PM [facility] CCM PHARMACIST [facility]  10/12/2021  3:00 PM [facility] HEALTH COACH [facility]  02/15/2022 10:00 AM Allwardt, Alyssa M, PA-C [facility]  02/18/2022  2:30 PM [facility] None  02/18/2022  3:00 PM Orson Slick, MD [facility] None   [name], Pharm.D., BCGP Clinical Pharmacist [place] [phone]

## 2021-03-04 NOTE — Patient Instructions (Signed)
Mr. Dubose,  Thank you for talking with me today. I have included our care plan/goals in the following pages.   Please review and call me at [phone] with any questions.  Thanks! Ellin Mayhew, Pharm.D., BCGP Clinical Pharmacist [facility] [place] [phone] Patient Care Plan: [place] Plan    Problem Identified: HLD HTN AFib   Priority: High    Long-Range Goal: Patient-Specific Goal   Start Date: 03/04/2021  Expected End Date: 03/04/2022  This Visit's Progress: On track  Priority: High  Note:   Current Barriers:  . Unable to maintain control of HLD - now on [place]):  Marland Kitchen Patient will verbalize ability to afford treatment regimen . contact provider office for questions/concerns as evidenced notation of same in electronic health record through collaboration with PharmD and provider.   Interventions: . 1:1 collaboration with Allwardt, [name], PA-C regarding development and update of comprehensive plan of care as evidenced by provider attestation and co-signature . Inter-disciplinary care team collaboration (see longitudinal plan of care) . Comprehensive medication review performed; medication list updated in electronic medical record . No medication changes  Hypertension (BP goal <140/90) -Controlled -Current treatment:  Amlodipine 10 mg daily  Chlorthalidone 25 mg daily  -Denies issues with potassium tolerability, able to take 20 meq tablets 3x/day without a problem.   -Current home readings: when symptomatic  -Current dietary habits: no specific diet. 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 Admits to not doing the best with hydradtion. -Denies hypotensive/hypertensive symptoms -Educated on BP goals and benefits of medications for prevention of heart attack, stroke and kidney damage; -Counseled to monitor BP at home,  document, and provide log at future appointments -Recommended to continue current medication Counseled on staying hydrated especially during times of intense physical activity  Hyperlipidemia: (LDL goal < 70) -Not ideally controlled per 12/2020 labs  -Repatha start 01/2021 -Current treatment: . Repatha 140 mg injection every 14 days (01/2021) -Medications previously tried: statin intolerant.  -Reports that everything is taken care of from a cost standpoint through lipid clinic. Will not be responsible for OOP spending due to approval of [employer] grant.  -Educated on Cholesterol goals;  -Recommended to continue current medication  Atrial Fibrillation (Goal: prevent stroke and major bleeding) -Controlled -Current treatment: . Rate control: Metoprolol succinate 25 mg once daily . Anticoagulation:  Eliquis 5 mg twice daily  -Denies costs concerns with Eliquis, currently about $40 dollars/month. Not wanting to pursue patient assistance at this time -Home BP and HR readings: n/a  -Counseled on increased risk of stroke due to Afib and benefits of anticoagulation for stroke prevention; importance of adherence to anticoagulant exactly as prescribed; -Recommended to continue current medication, again emphasized adequate water intake.  Patient Goals/Self-Care Activities . Patient will:  - take medications as prescribed  Follow Up Plan: [facility] telephone f/u 4-6 months Medication Assistance: None required.  Patient affirms current coverage meets needs.  Patient's preferred pharmacy is:  [facility] [address] [place] - [address] [place] [place] [address] Phone: [phone] Fax: [phone]  Follow Up:  Patient agrees to Care Plan and Follow-up.     The patient verbalized understanding of instructions provided today and agreed to receive a MyChart copy of patient instruction and/or educational materials. Telephone follow up appointment with  pharmacy team member scheduled for: See next appointment with "Care Management Staff" under "What's Next"  below.

## 2021-03-09 ENCOUNTER — Telehealth: Payer: Medicare HMO

## 2021-03-25 ENCOUNTER — Ambulatory Visit: Payer: Medicare HMO | Admitting: Neurology

## 2021-03-25 ENCOUNTER — Encounter: Payer: Self-pay | Admitting: Neurology

## 2021-03-25 VITALS — BP 130/80 | HR 63 | Ht 68.0 in | Wt 207.6 lb

## 2021-03-25 DIAGNOSIS — I63412 Cerebral infarction due to embolism of left middle cerebral artery: Secondary | ICD-10-CM

## 2021-03-25 DIAGNOSIS — D751 Secondary polycythemia: Secondary | ICD-10-CM

## 2021-03-25 DIAGNOSIS — R200 Anesthesia of skin: Secondary | ICD-10-CM

## 2021-03-25 NOTE — Patient Instructions (Signed)
I had a long d/w patient and his wife  about his recent stroke,post stroke paresthesias, atrial fibrillation, polycythemia,risk for recurrent stroke/TIAs, personally independently reviewed imaging studies and stroke evaluation results and answered questions.Continue Eliquis (apixaban)  5 mg twice dailydaily  for secondary stroke prevention and maintain strict control of hypertension with blood pressure goal below 130/90, diabetes with hemoglobin A1c goal below 6.5% and lipids with LDL cholesterol goal below 70 mg/dL. I also advised the patient to eat a healthy diet with plenty of whole grains, cereals, fruits and vegetables, exercise regularly and maintain ideal body weight check follow-up lipid profile.  I recommend patient taper and discontinue gabapentin as he no longer needs it and it may perhaps be contributing to his tiredness.  Continue follow-up with hematologist Dr. Lorenso Courier for his polycythemia and sarcoidosis..Followup in the future with me in 3 months or call earlier if necessary.  Stroke Prevention Some medical conditions and behaviors are associated with a higher chance of having a stroke. You can help prevent a stroke by making nutrition, lifestyle, and other changes, including managing any medical conditions you may have. What nutrition changes can be made?  Eat healthy foods. You can do this by: ? Choosing foods high in fiber, such as fresh fruits and vegetables and whole grains. ? Eating at least 5 or more servings of fruits and vegetables a day. Try to fill half of your plate at each meal with fruits and vegetables. ? Choosing lean protein foods, such as lean cuts of meat, poultry without skin, fish, tofu, beans, and nuts. ? Eating low-fat dairy products. ? Avoiding foods that are high in salt (sodium). This can help lower blood pressure. ? Avoiding foods that have saturated fat, trans fat, and cholesterol. This can help prevent high cholesterol. ? Avoiding processed and premade  foods.  Follow your health care provider's specific guidelines for losing weight, controlling high blood pressure (hypertension), lowering high cholesterol, and managing diabetes. These may include: ? Reducing your daily calorie intake. ? Limiting your daily sodium intake to 1,500 milligrams (mg). ? Using only healthy fats for cooking, such as olive oil, canola oil, or sunflower oil. ? Counting your daily carbohydrate intake.   What lifestyle changes can be made?  Maintain a healthy weight. Talk to your health care provider about your ideal weight.  Get at least 30 minutes of moderate physical activity at least 5 days a week. Moderate activity includes brisk walking, biking, and swimming.  Do not use any products that contain nicotine or tobacco, such as cigarettes and e-cigarettes. If you need help quitting, ask your health care provider. It may also be helpful to avoid exposure to secondhand smoke.  Limit alcohol intake to no more than 1 drink a day for nonpregnant women and 2 drinks a day for men. One drink equals 12 oz of beer, 5 oz of wine, or 1 oz of hard liquor.  Stop any illegal drug use.  Avoid taking birth control pills. Talk to your health care provider about the risks of taking birth control pills if: ? You are over 74 years old. ? You smoke. ? You get migraines. ? You have ever had a blood clot. What other changes can be made?  Manage your cholesterol levels. ? Eating a healthy diet is important for preventing high cholesterol. If cholesterol cannot be managed through diet alone, you may also need to take medicines. ? Take any prescribed medicines to control your cholesterol as told by your health care provider.  Manage your diabetes. ? Eating a healthy diet and exercising regularly are important parts of managing your blood sugar. If your blood sugar cannot be managed through diet and exercise, you may need to take medicines. ? Take any prescribed medicines to control  your diabetes as told by your health care provider.  Control your hypertension. ? To reduce your risk of stroke, try to keep your blood pressure below 130/80. ? Eating a healthy diet and exercising regularly are an important part of controlling your blood pressure. If your blood pressure cannot be managed through diet and exercise, you may need to take medicines. ? Take any prescribed medicines to control hypertension as told by your health care provider. ? Ask your health care provider if you should monitor your blood pressure at home. ? Have your blood pressure checked every year, even if your blood pressure is normal. Blood pressure increases with age and some medical conditions.  Get evaluated for sleep disorders (sleep apnea). Talk to your health care provider about getting a sleep evaluation if you snore a lot or have excessive sleepiness.  Take over-the-counter and prescription medicines only as told by your health care provider. Aspirin or blood thinners (antiplatelets or anticoagulants) may be recommended to reduce your risk of forming blood clots that can lead to stroke.  Make sure that any other medical conditions you have, such as atrial fibrillation or atherosclerosis, are managed. What are the warning signs of a stroke? The warning signs of a stroke can be easily remembered as BEFAST.  B is for balance. Signs include: ? Dizziness. ? Loss of balance or coordination. ? Sudden trouble walking.  E is for eyes. Signs include: ? A sudden change in vision. ? Trouble seeing.  F is for face. Signs include: ? Sudden weakness or numbness of the face. ? The face or eyelid drooping to one side.  A is for arms. Signs include: ? Sudden weakness or numbness of the arm, usually on one side of the body.  S is for speech. Signs include: ? Trouble speaking (aphasia). ? Trouble understanding.  T is for time. ? These symptoms may represent a serious problem that is an emergency. Do not  wait to see if the symptoms will go away. Get medical help right away. Call your local emergency services (911 in the [place] Do not drive yourself to the hospital.  Other signs of stroke may include: ? A sudden, severe headache with no known cause. ? Nausea or vomiting. ? Seizure. Where to find more information For more information, visit:  American Stroke Association: www.strokeassociation.org  National Stroke Association: www.stroke.org Summary  You can prevent a stroke by eating healthy, exercising, not smoking, limiting alcohol intake, and managing any medical conditions you may have.  Do not use any products that contain nicotine or tobacco, such as cigarettes and e-cigarettes. If you need help quitting, ask your health care provider. It may also be helpful to avoid exposure to secondhand smoke.  Remember BEFAST for warning signs of stroke. Get help right away if you or a loved one has any of these signs. This information is not intended to replace advice given to you by your health care provider. Make sure you discuss any questions you have with your health care provider. Document Revised: 10/21/2017 Document Reviewed: 12/14/2016 Elsevier Patient Education  2021 [employer].

## 2021-03-25 NOTE — Progress Notes (Signed)
[facility] [address] [place]. [place] [address] [phone]       OFFICE CONSULT NOTE  Mr. Patrick Roth Date of Birth:  1945-12-29 Medical Record Number:  [id number]   Referring MD: Barb Merino Reason for Referral: Stroke  HPI: Patrick Roth is a 75 year old pleasant African-American male seen today for initial office consultation visit for stroke.  History is obtained from the patient and his wife and review of electronic medical records and I personally reviewed available pertinent imaging films in PACS.  He has past medical history history significant for hypertension, hyperlipidemia, atrial flutter on Eliquis, prior stroke with mild residual right-sided weakness, sciatica.  He presented on 01/16/2021 with sudden onset of numbness and right leg from the knee down as well as right arm and face.  Started improving with days sought medical help and evaluation he was found to have any only minimum numbness and was not given tPA CT head was unremarkable.  MRI scan was obtained which showed left frontoparietal cortical embolic infarct.  CT angiogram of the brain and neck showed only 30% extracranial right carotid stenosis no significant intracranial stenosis.  Echocardiogram showed ejection fraction of 55% without cardiac source of embolism.  He had previously had a 7-day Holter monitor in 09/29/2020 which had shown atrial fibrillation and flutter.  LDL cholesterol was 201 mg percent and hemoglobin A1c was 5.8.  Patient was also found to have polycythemia and hematocrit was 57.3 on 02/03/2021.  Lab test for JA K2 mutation was negative.  Patient does have history of sarcoidosis and has seen hematologist Dr. 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  His atrial fibrillation was diagnosed 4 years ago and he was initially on Xarelto but for unclear reason has since been switched to Eliquis.  His hyperlipidemia has not  responded to statins and he has switched to [place] which he started after his recent stroke and is tolerating it well without any injection site reactions.  Patient states his numbness is mostly resolved resolved but he does have some tiredness which has persisted.  ROS:   14 system review of systems is positive for numbness, shortness of breath, bruising and all other systems negative  PMH:  Past Medical History:  Diagnosis Date  . Atrial flutter ([place])   . Colon polyps   . Constipation 05/27/2015  . DOE (dyspnea on exertion) 10/13/2015  . Faintness 06/20/2016  . History of chicken pox   . Hyperlipemia 02/13/2016  . Hyperlipidemia   . Hypertension   . Hypokalemia 06/28/2016  . Hyponatremia 06/28/2016  . Internal hemorrhoids   . Irregular heartbeat   . Malignant hypertension 07/15/2015  . Sciatica   . Stroke [facility]) 2008  . Syncope and collapse 06/28/2016    Social History:  Social History   Socioeconomic History  . Marital status: Married    Spouse name: [name]  . Number of children: 1  . Years of education: 71  . Highest education level: Not on file  Occupational History  . Occupation: Retired  Tobacco Use  . Smoking status: Never Smoker  . Smokeless tobacco: Never Used  Vaping Use  . Vaping Use: Never used  Substance and Sexual Activity  . Alcohol use: No  . Drug use: No  . Sexual activity: Not on file  Other Topics Concern  . Not on file  Social History Narrative   Lives with wife   Right Handed   Drinks no caffeine  Social Determinants of Health   Financial Resource Strain: Low Risk   . Difficulty of Paying Living Expenses: Not hard at all  Food Insecurity: No Food Insecurity  . Worried About Charity fundraiser in the Last Year: Never true  . Ran Out of Food in the Last Year: Never true  Transportation Needs: No Transportation Needs  . Lack of Transportation (Medical): No  . Lack of Transportation (Non-Medical): No  Physical Activity: Sufficiently Active  .  Days of Exercise per Week: 7 days  . Minutes of Exercise per Session: 30 min  Stress: No Stress Concern Present  . Feeling of Stress : Not at all  Social Connections: Moderately Isolated  . Frequency of Communication with Friends and Family: More than three times a week  . Frequency of Social Gatherings with Friends and Family: Once a week  . Attends Religious Services: Never  . Active Member of Clubs or Organizations: No  . Attends Archivist Meetings: Never  . Marital Status: Married  Human resources officer Violence: Not At Risk  . Fear of Current or Ex-Partner: No  . Emotionally Abused: No  . Physically Abused: No  . Sexually Abused: No    Medications:   Current Outpatient Medications on File Prior to Visit  Medication Sig Dispense Refill  . acetaminophen (TYLENOL) 650 MG CR tablet Take 650 mg by mouth every 8 (eight) hours as needed for pain.    Marland Kitchen amLODipine (NORVASC) 10 MG tablet Take 10 mg by mouth daily.    . chlorthalidone (HYGROTON) 25 MG tablet Take 1 tablet by mouth once daily 90 tablet 0  . ELIQUIS 5 MG TABS tablet Take 1 tablet by mouth twice daily 60 tablet 10  . Evolocumab (REPATHA SURECLICK) 938 MG/ML SOAJ Inject 1 pen into the skin every 14 (fourteen) days. 6 mL 3  . metoprolol succinate (TOPROL XL) 25 MG 24 hr tablet Take 1 tablet (25 mg total) by mouth daily. 90 tablet 3  . oxyCODONE-acetaminophen (PERCOCET/ROXICET) 5-325 MG tablet Take 1 tablet by mouth every 4 (four) hours as needed for severe pain. 12 tablet 0  . potassium chloride SA (KLOR-CON) 20 MEQ tablet TAKE 1 TABLET BY MOUTH IN THE MORNING AND 2 IN THE EVENING 270 tablet 1   No current facility-administered medications on file prior to visit.    Allergies:   Allergies  Allergen Reactions  . Crestor [Rosuvastatin Calcium] Shortness Of Breath    Muscle cramps  . Spironolactone Shortness Of Breath  . Terazosin Shortness Of Breath, Palpitations and Other (See Comments)    Nerve pain  . Amiodarone  Other (See Comments)    Dropped pulse real low to 45-50, and made him feel very cold  . Atenolol Other (See Comments)    Drowsiness and "flu sxs"  . Bystolic [Nebivolol Hcl] Other (See Comments)    GI issues  . Hydralazine Other (See Comments)    Joint swelling  . Tizanidine Other (See Comments)    Syncope, Elevated BP/pulse  . Amlodipine Other (See Comments)  . Clonidine Other (See Comments)  . Hydrocodone-Acetaminophen Other (See Comments)  . Levofloxacin Other (See Comments)  . Losartan Other (See Comments)    Did not work.  Pt states he couldn't take b/c of SE, but can't remember what SE were.  . Maxzide [Hydrochlorothiazide W-Triamterene] Other (See Comments)    Does not tolerate potassium-sparing diuretics  . Olmesartan Other (See Comments)  . Tape Other (See Comments)    Other reaction(s):  Unknown  . Tussionex Pennkinetic Er [Hydrocod Polst-Cpm Polst Er] Other (See Comments)    Other reaction(s): Unknown    Physical Exam General: well developed, well nourished elderly African-American male, seated, in no evident distress Head: head normocephalic and atraumatic.   Neck: supple with no carotid or supraclavicular bruits Cardiovascular: regular rate and rhythm, no murmurs Musculoskeletal: no deformity Skin:  no rash/petichiae Vascular:  Normal pulses all extremities  Neurologic Exam Mental Status: Awake and fully alert. Oriented to place and time. Recent and remote memory intact. Attention span, concentration and fund of knowledge appropriate. Mood and affect appropriate.  Cranial Nerves: Fundoscopic exam reveals sharp disc margins. Pupils equal, briskly reactive to light. Extraocular movements full without nystagmus. Visual fields full to confrontation. Hearing intact. Facial sensation intact. Face, tongue, palate moves normally and symmetrically.  Motor: Normal bulk and tone. Normal strength in all tested extremity muscles. Sensory.: intact to touch , pinprick , position  and vibratory sensation except patchy hypoesthesia in right corner of the mouth and right hand.  Coordination: Rapid alternating movements normal in all extremities. Finger-to-nose and heel-to-shin performed accurately bilaterally. Gait and Station: Arises from chair without difficulty. Stance is normal. Gait demonstrates normal stride length and balance . Able to heel, toe and tandem walk with slight difficulty.  Reflexes: 1+ and symmetric. Toes downgoing.   NIHSS  1 Modified Rankin  2   ASSESSMENT: 75 year old African-American male with left frontal MCA branch embolic infarcts secondary to atrial fibrillation and polycythemia in February 2022     PLAN: I had a long d/w patient and his wife  about his recent stroke,post stroke paresthesias, atrial fibrillation, polycythemia,risk for recurrent stroke/TIAs, personally independently reviewed imaging studies and stroke evaluation results and answered questions.Continue Eliquis (apixaban)  5 mg twice daily  for secondary stroke prevention and maintain strict control of hypertension with blood pressure goal below 130/90, diabetes with hemoglobin A1c goal below 6.5% and lipids with LDL cholesterol goal below 70 mg/dL. I also advised the patient to eat a healthy diet with plenty of whole grains, cereals, fruits and vegetables, exercise regularly and maintain ideal body weight check follow-up lipid profile.  I recommend patient taper and discontinue gabapentin as he no longer needs it and it may perhaps be contributing to his tiredness.  Continue follow-up with hematologist Dr. Lorenso Courier for his polycythemia and sarcoidosis..Followup in the future with me in 3 months or call earlier if necessary.  Greater than 50% time during this 45-minute consultation visit was spent on counseling and coordination of care about his embolic stroke, atrial fibrillation, polycythemia and answering questions.  Antony Contras MD  Note: This document was prepared with digital  dictation and possible smart phrase technology. Any transcriptional errors that result from this process are unintentional.

## 2021-03-26 LAB — LIPID PANEL
Chol/HDL Ratio: 2.6 ratio (ref 0.0–5.0)
Cholesterol, Total: 137 mg/dL (ref 100–199)
HDL: 53 mg/dL (ref >39)
LDL Chol Calc (NIH): 70 mg/dL (ref 0–99)
Triglycerides: 68 mg/dL (ref 0–149)
VLDL Cholesterol Cal: 14 mg/dL (ref 5–40)

## 2021-03-27 NOTE — Progress Notes (Signed)
Kindly inform the patient that cholesterol profile was all satisfactory

## 2021-03-30 ENCOUNTER — Telehealth: Payer: Self-pay | Admitting: Neurology

## 2021-03-30 ENCOUNTER — Other Ambulatory Visit: Payer: Self-pay

## 2021-03-30 MED ORDER — REPATHA SURECLICK 140 MG/ML ~~LOC~~ SOAJ: 1.0000 "pen " | SUBCUTANEOUS | 3 refills | Status: DC

## 2021-03-30 NOTE — Telephone Encounter (Signed)
-----   Message from Garvin Fila, MD sent at 03/27/2021  9:40 AM EDT ----- Mitchell Heir inform the patient that cholesterol profile was all satisfactory

## 2021-03-30 NOTE — Telephone Encounter (Signed)
Called the pt and spoke with the wife. Was able to provide her with the lab results. She verbalized understanding of the normal lab results and had no questions.

## 2021-04-02 ENCOUNTER — Other Ambulatory Visit: Payer: Self-pay | Admitting: Pharmacist

## 2021-04-02 MED ORDER — REPATHA SURECLICK 140 MG/ML ~~LOC~~ SOAJ: 1.0000 "pen " | SUBCUTANEOUS | 3 refills | Status: DC

## 2021-04-06 ENCOUNTER — Other Ambulatory Visit: Payer: Self-pay | Admitting: Family Medicine

## 2021-04-08 ENCOUNTER — Other Ambulatory Visit: Payer: Self-pay | Admitting: Family Medicine

## 2021-04-09 ENCOUNTER — Telehealth: Payer: Self-pay | Admitting: *Deleted

## 2021-04-09 NOTE — Telephone Encounter (Signed)
Received vm message from patient with concerns about  having 'thickness of the blood' and if Dr. Lorenso Courier knew about this when came here for visits. Pt does not recall having this issue discussed with him. He would like a call back from Dr. Lorenso Courier to discuss this. He seemed quite concerned.

## 2021-04-10 ENCOUNTER — Telehealth: Payer: Self-pay | Admitting: Cardiology

## 2021-04-10 NOTE — Telephone Encounter (Signed)
New message   Patient states that he has had 2 strokes in the past.  He want to know if he has "sticky" blood or it is a "clotting" problem that is causing him to have strokes.  Please call at your convenience.

## 2021-04-13 ENCOUNTER — Other Ambulatory Visit: Payer: Self-pay

## 2021-04-13 ENCOUNTER — Other Ambulatory Visit: Payer: Medicare HMO | Admitting: *Deleted

## 2021-04-13 DIAGNOSIS — E782 Mixed hyperlipidemia: Secondary | ICD-10-CM

## 2021-04-13 LAB — HEPATIC FUNCTION PANEL
ALT: 25 IU/L (ref 0–44)
AST: 25 IU/L (ref 0–40)
Albumin: 4.4 g/dL (ref 3.7–4.7)
Alkaline Phosphatase: 67 IU/L (ref 44–121)
Bilirubin Total: 1.8 mg/dL — ABNORMAL HIGH (ref 0.0–1.2)
Bilirubin, Direct: 0.41 mg/dL — ABNORMAL HIGH (ref 0.00–0.40)
Total Protein: 7.1 g/dL (ref 6.0–8.5)

## 2021-04-13 LAB — LIPID PANEL
Chol/HDL Ratio: 2.8 ratio (ref 0.0–5.0)
Cholesterol, Total: 154 mg/dL (ref 100–199)
HDL: 55 mg/dL (ref >39)
LDL Chol Calc (NIH): 83 mg/dL (ref 0–99)
Triglycerides: 87 mg/dL (ref 0–149)
VLDL Cholesterol Cal: 16 mg/dL (ref 5–40)

## 2021-04-13 NOTE — Telephone Encounter (Signed)
Followed up with pt. Discussed concerns and made aware that polycythemia did not cause his AFib or vice versa. Discussed the difference between polycythemia and AFib. Pt aware we can further discuss at OV follow up next month. Patient verbalized understanding and agreeable to plan.

## 2021-04-17 DIAGNOSIS — I4891 Unspecified atrial fibrillation: Secondary | ICD-10-CM | POA: Diagnosis not present

## 2021-04-17 DIAGNOSIS — I639 Cerebral infarction, unspecified: Secondary | ICD-10-CM | POA: Diagnosis not present

## 2021-04-17 DIAGNOSIS — N1831 Chronic kidney disease, stage 3a: Secondary | ICD-10-CM | POA: Diagnosis not present

## 2021-04-17 DIAGNOSIS — I129 Hypertensive chronic kidney disease with stage 1 through stage 4 chronic kidney disease, or unspecified chronic kidney disease: Secondary | ICD-10-CM | POA: Diagnosis not present

## 2021-04-17 DIAGNOSIS — D869 Sarcoidosis, unspecified: Secondary | ICD-10-CM | POA: Diagnosis not present

## 2021-04-17 DIAGNOSIS — E785 Hyperlipidemia, unspecified: Secondary | ICD-10-CM | POA: Diagnosis not present

## 2021-05-19 ENCOUNTER — Encounter: Payer: Self-pay | Admitting: Cardiology

## 2021-05-19 ENCOUNTER — Telehealth: Payer: Self-pay

## 2021-05-19 ENCOUNTER — Ambulatory Visit: Payer: Medicare HMO | Admitting: Cardiology

## 2021-05-19 ENCOUNTER — Other Ambulatory Visit: Payer: Self-pay

## 2021-05-19 VITALS — BP 102/72 | HR 46 | Ht 68.0 in | Wt 205.2 lb

## 2021-05-19 DIAGNOSIS — I480 Paroxysmal atrial fibrillation: Secondary | ICD-10-CM

## 2021-05-19 MED ORDER — POTASSIUM CHLORIDE CRYS ER 20 MEQ PO TBCR: EXTENDED_RELEASE_TABLET | ORAL | 1 refills | Status: DC

## 2021-05-19 MED ORDER — CHLORTHALIDONE 25 MG PO TABS: 25.0000 mg | ORAL_TABLET | Freq: Every day | ORAL | 1 refills | Status: DC

## 2021-05-19 NOTE — Patient Instructions (Signed)
Medication Instructions:  °Your physician recommends that you continue on your current medications as directed. Please refer to the Current Medication list given to you today. ° °*If you need a refill on your cardiac medications before your next appointment, please call your pharmacy* ° ° °Lab Work: °None ordered ° ° °Testing/Procedures: °None ordered ° ° °Follow-Up: °At [facility], you and your health needs are our priority.  As part of our continuing mission to provide you with exceptional heart care, we have created designated Provider Care Teams.  These Care Teams include your primary Cardiologist (physician) and Advanced Practice Providers (APPs -  Physician Assistants and Nurse Practitioners) who all work together to provide you with the care you need, when you need it. ° °Your next appointment:   °6 month(s) ° °The format for your next appointment:   °In Person ° °Provider:   °Will Camnitz, MD ° ° ° °Thank you for choosing [facility]!! ° ° °Glorianne Proctor, RN °[phone] °  °

## 2021-05-19 NOTE — Progress Notes (Signed)
Electrophysiology Office Note   Date:  05/19/2021   ID:  Patrick Roth, DOB 1946/07/03, MRN [id number]  PCP:  Patrick Lathe, PA-C  Cardiologist:  [name] Primary Electrophysiologist:  Patrick Pillay Meredith Leeds, MD    No chief complaint on file.    History of Present Illness: Patrick Roth is a 75 y.o. male who presents today for electrophysiology evaluation.     He has a history significant for hypertension, hyperlipidemia, CVA, atrial fibrillation, and atrial flutter.  He was admitted 7/17 with syncope felt due to orthostatic hypotension.  He was also found to be in new onset atrial fibrillation with rapid rates.  He was minimally symptomatic.  He was started on metoprolol and Xarelto.  He has since been started on amiodarone.  Today, denies symptoms of palpitations, chest pain, shortness of breath, orthopnea, PND, lower extremity edema, claudication, dizziness, presyncope, syncope, bleeding, or neurologic sequela. The patient is tolerating medications without difficulties.  Unfortunately, February 2022 he had a cryptogenic stroke.  He has since recovered.  He was found to be polycythemic.  He is currently undergoing work-up for this.  He is curious as to whether or not he needs phlebotomy.  Past Medical History:  Diagnosis Date   Atrial flutter ([place])    Colon polyps    Constipation 05/27/2015   DOE (dyspnea on exertion) 10/13/2015   Faintness 06/20/2016   History of chicken pox    Hyperlipemia 02/13/2016   Hyperlipidemia    Hypertension    Hypokalemia 06/28/2016   Hyponatremia 06/28/2016   Internal hemorrhoids    Irregular heartbeat    Malignant hypertension 07/15/2015   Sciatica    Stroke ([place]) 2008   Syncope and collapse 06/28/2016   Past Surgical History:  Procedure Laterality Date   CARDIOVERSION N/A 09/02/2017   Procedure: CARDIOVERSION;  Surgeon: [name], MD;  Location: [facility] ENDOSCOPY;  Service: Cardiovascular;  Laterality: N/A;   COLONOSCOPY  2011   great toe  surgery     PROSTATE BIOPSY       Current Outpatient Medications  Medication Sig Dispense Refill   acetaminophen (TYLENOL) 650 MG CR tablet Take 650 mg by mouth every 8 (eight) hours as needed for pain.     amLODipine (NORVASC) 10 MG tablet Take 10 mg by mouth daily.     chlorthalidone (HYGROTON) 25 MG tablet Take 1 tablet (25 mg total) by mouth daily. 90 tablet 1   ELIQUIS 5 MG TABS tablet Take 1 tablet by mouth twice daily 60 tablet 10   Evolocumab (REPATHA SURECLICK) 779 MG/ML SOAJ Inject 1 pen into the skin every 14 (fourteen) days. 6 mL 3   metoprolol succinate (TOPROL XL) 25 MG 24 hr tablet Take 1 tablet (25 mg total) by mouth daily. 90 tablet 3   oxyCODONE-acetaminophen (PERCOCET/ROXICET) 5-325 MG tablet Take 1 tablet by mouth every 4 (four) hours as needed for severe pain. 12 tablet 0   potassium chloride SA (KLOR-CON) 20 MEQ tablet TAKE 1 TABLET BY MOUTH IN THE MORNING AND 2 IN THE EVENING 270 tablet 1   No current facility-administered medications for this visit.    Allergies:   Crestor [rosuvastatin calcium], Spironolactone, Terazosin, Amiodarone, Atenolol, Bystolic [nebivolol hcl], Hydralazine, Tizanidine, Amlodipine, Clonidine, Hydrocodone-acetaminophen, Levofloxacin, Losartan, Maxzide [hydrochlorothiazide w-triamterene], Olmesartan, Tape, and Tussionex pennkinetic er [hydrocod polst-cpm polst er]   Social History:  The patient  reports that he has never smoked. He has never used smokeless tobacco. He reports that he does not drink alcohol  and does not use drugs.   Family History:  The patient's family history includes Healthy in his brother and sister; Heart disease in his brother and father; Hypertension (age of onset: 30) in his father; Lung cancer (age of onset: 98) in his mother; Lupus in his daughter; Other in his daughter.   ROS:  Please see the history of present illness.   Otherwise, review of systems is positive for none.   All other systems are reviewed and negative.    PHYSICAL EXAM: VS:  BP 102/72   Pulse (!) 46   Ht 5\' 8"  (1.727 m)   Wt 205 lb 3.2 oz (93.1 kg)   SpO2 99%   BMI 31.20 kg/m  , BMI Body mass index is 31.2 kg/m. GEN: Well nourished, well developed, in no acute distress  HEENT: normal  Neck: no JVD, carotid bruits, or masses Cardiac: RRR; no murmurs, rubs, or gallops,no edema  Respiratory:  clear to auscultation bilaterally, normal work of breathing GI: soft, nontender, nondistended, + BS MS: no deformity or atrophy  Skin: warm and dry Neuro:  Strength and sensation are intact Psych: euthymic mood, full affect  EKG:  EKG is ordered today. Personal review of the ekg ordered  shows sinus rhythm, first-degree AV block, right bundle branch, left anterior fascicular block  Recent Labs: 01/17/2021: Magnesium 1.9; Magnesium 1.8; TSH 3.801 02/03/2021: B Natriuretic Peptide 161.9; BUN 16; Creatinine, Ser 1.44; Hemoglobin 16.8; Platelets 184; Potassium 3.7; Sodium 139 04/13/2021: ALT 25    Lipid Panel     Component Value Date/Time   CHOL 154 04/13/2021 0933   TRIG 87 04/13/2021 0933   HDL 55 04/13/2021 0933   CHOLHDL 2.8 04/13/2021 0933   CHOLHDL 4.6 01/17/2021 0036   VLDL 11 01/17/2021 0036   LDLCALC 83 04/13/2021 0933     Wt Readings from Last 3 Encounters:  05/19/21 205 lb 3.2 oz (93.1 kg)  03/25/21 207 lb 9.6 oz (94.2 kg)  02/18/21 219 lb (99.3 kg)      Other studies Reviewed: Additional studies/ records that were reviewed today include: TTE 02/28/18 Review of the above records today demonstrates:  - Left ventricle: The cavity size was normal. Systolic function was   normal. The estimated ejection fraction was in the range of 55%   to 60%. Wall motion was normal; there were no regional wall   motion abnormalities. There was a reduced contribution of atrial   contraction to ventricular filling, due to increased ventricular   diastolic pressure or atrial contractile dysfunction. The study   is not technically sufficient  to allow evaluation of LV diastolic   function. (suspect left atrial mechanical failure, possible   stunning after recent episode of atrial fibrillation versus   chronic atrial dysfunction) - Left atrium: The atrium was mildly dilated. - Right ventricle: The cavity size was mildly dilated. Wall   thickness was normal. - Right atrium: The atrium was mildly dilated. - Pulmonary arteries: Systolic pressure was moderately increased.   PA peak pressure: 60 mm Hg (S).  Myoview 02/24/18 Nuclear stress EF: 58%. Normal perfusion. No ischemia This is a low risk study.  ASSESSMENT AND PLAN:  1.  Paroxysmal atrial fibrillation/atrial flutter: Currently on Eliquis.  CHA2DS2-VASc of 4.  High risk medication monitoring.  He is in sinus rhythm today.  We Evelyn Aguinaldo continue with current management.  Unfortunately he had a stroke in February.  He was found to be polycythemic at that time.  He is currently undergoing therapy and  testing for that.   2.  Hypertension: Currently well controlled  Current medicines are reviewed at length with the patient today.   The patient does not have concerns regarding his medicines.  The following changes were made today: None  Labs/ tests ordered today include:  Orders Placed This Encounter  Procedures   EKG 12-Lead      Disposition:   FU with Andre Gallego 6 months  Signed, Katty Fretwell Meredith Leeds, MD  05/19/2021 10:10 AM     [facility] [address] [place] [address] [phone] (office) [phone] (fax)

## 2021-05-19 NOTE — Telephone Encounter (Signed)
Error

## 2021-05-20 ENCOUNTER — Other Ambulatory Visit: Payer: Self-pay | Admitting: Physician Assistant

## 2021-05-22 ENCOUNTER — Other Ambulatory Visit: Payer: Self-pay | Admitting: Physician Assistant

## 2021-05-26 ENCOUNTER — Ambulatory Visit (INDEPENDENT_AMBULATORY_CARE_PROVIDER_SITE_OTHER): Payer: Medicare HMO | Admitting: Physician Assistant

## 2021-05-26 ENCOUNTER — Encounter: Payer: Self-pay | Admitting: Physician Assistant

## 2021-05-26 ENCOUNTER — Other Ambulatory Visit: Payer: Self-pay

## 2021-05-26 VITALS — BP 121/75 | HR 53 | Temp 97.6°F | Ht 68.0 in | Wt 202.2 lb

## 2021-05-26 DIAGNOSIS — D751 Secondary polycythemia: Secondary | ICD-10-CM | POA: Diagnosis not present

## 2021-05-26 DIAGNOSIS — I480 Paroxysmal atrial fibrillation: Secondary | ICD-10-CM | POA: Diagnosis not present

## 2021-05-26 DIAGNOSIS — I10 Essential (primary) hypertension: Secondary | ICD-10-CM

## 2021-05-26 LAB — CBC WITH DIFFERENTIAL/PLATELET
Basophils Absolute: 0 10*3/uL (ref 0.0–0.1)
Basophils Relative: 0.8 % (ref 0.0–3.0)
Eosinophils Absolute: 0.1 10*3/uL (ref 0.0–0.7)
Eosinophils Relative: 3.3 % (ref 0.0–5.0)
HCT: 47.2 % (ref 39.0–52.0)
Hemoglobin: 15.6 g/dL (ref 13.0–17.0)
Lymphocytes Relative: 30.6 % (ref 12.0–46.0)
Lymphs Abs: 1.3 10*3/uL (ref 0.7–4.0)
MCHC: 32.9 g/dL (ref 30.0–36.0)
MCV: 90.1 fl (ref 78.0–100.0)
Monocytes Absolute: 0.7 10*3/uL (ref 0.1–1.0)
Monocytes Relative: 16.3 % — ABNORMAL HIGH (ref 3.0–12.0)
Neutro Abs: 2 10*3/uL (ref 1.4–7.7)
Neutrophils Relative %: 49 % (ref 43.0–77.0)
Platelets: 150 10*3/uL (ref 150.0–400.0)
RBC: 5.24 Mil/uL (ref 4.22–5.81)
RDW: 14 % (ref 11.5–15.5)
WBC: 4.1 10*3/uL (ref 4.0–10.5)

## 2021-05-26 NOTE — Patient Instructions (Addendum)
For the next week, please check blood pressure and heart rate in the morning and around dinner timer.  Keep a log of these readings, as well as how you are feeling at that time. Bring the log and all of your current medications to next appointment in one week.   CBC today to check for polycythemia.

## 2021-05-26 NOTE — Progress Notes (Signed)
Established Patient Office Visit  Subjective:  Patient ID: Patrick Roth, male    DOB: Mar 12, 1946  Age: 75 y.o. MRN: [id number]  CC:  Chief Complaint  Patient presents with   Hypertension   Atrial Fibrillation    HPI Patrick Roth presents for mainly concerned about possible polycythemia; States that he has reached out to Dr. Libby Maw office, but has not heard back on this. He would also like clarification on his last cardiology visit as well. He is concerned about his blood pressure or pulse dropping too low occasionally and wants to make sure he is on the right medications.   Denies any chest pain, palpitations, flutters, headaches, blurred vision, or weakness. Intermittent fatigue or lightheaded feeling, notices worse with change in positions in some mornings.   Past Medical History:  Diagnosis Date   Atrial flutter ([place])    Colon polyps    Constipation 05/27/2015   DOE (dyspnea on exertion) 10/13/2015   Faintness 06/20/2016   History of chicken pox    Hyperlipemia 02/13/2016   Hyperlipidemia    Hypertension    Hypokalemia 06/28/2016   Hyponatremia 06/28/2016   Internal hemorrhoids    Irregular heartbeat    Malignant hypertension 07/15/2015   Sciatica    Stroke [facility]) 2008   Syncope and collapse 06/28/2016    Past Surgical History:  Procedure Laterality Date   CARDIOVERSION N/A 09/02/2017   Procedure: CARDIOVERSION;  Surgeon: [name], MD;  Location: [facility] ENDOSCOPY;  Service: Cardiovascular;  Laterality: N/A;   COLONOSCOPY  2011   great toe surgery     PROSTATE BIOPSY      Family History  Problem Relation Age of Onset   Hypertension Father 9       Deceased   Heart disease Father    Lung cancer Mother 70       Deceased   Healthy Sister        x3   Healthy Brother        x4   Heart disease Brother        #5   Other Daughter        Alpha Thalassemia   Lupus Daughter        #2-deceased   Diabetes Neg Hx    Heart attack Neg Hx    Hyperlipidemia Neg Hx     Sudden death Neg Hx     Social History   Socioeconomic History   Marital status: Married    Spouse name: [name]   Number of children: 1   Years of education: 12   Highest education level: Not on file  Occupational History   Occupation: Retired  Tobacco Use   Smoking status: Never   Smokeless tobacco: Never  Vaping Use   Vaping Use: Never used  Substance and Sexual Activity   Alcohol use: No   Drug use: No   Sexual activity: Not on file  Other Topics Concern   Not on file  Social History Narrative   Lives with wife   Right Handed   Drinks no caffeine   Social Determinants of Health   Financial Resource Strain: Low Risk    Difficulty of Paying Living Expenses: Not hard at all  Food Insecurity: No Food Insecurity   Worried About Charity fundraiser in the Last Year: Never true   Arboriculturist in the Last Year: Never true  Transportation Needs: No Transportation Needs   Lack of Transportation (Medical): No  Lack of Transportation (Non-Medical): No  Physical Activity: Sufficiently Active   Days of Exercise per Week: 7 days   Minutes of Exercise per Session: 30 min  Stress: No Stress Concern Present   Feeling of Stress : Not at all  Social Connections: Moderately Isolated   Frequency of Communication with Friends and Family: More than three times a week   Frequency of Social Gatherings with Friends and Family: Once a week   Attends Religious Services: Never   Marine scientist or Organizations: No   Attends Music therapist: Never   Marital Status: Married  Human resources officer Violence: Not At Risk   Fear of Current or Ex-Partner: No   Emotionally Abused: No   Physically Abused: No   Sexually Abused: No    Outpatient Medications Prior to Visit  Medication Sig Dispense Refill   acetaminophen (TYLENOL) 650 MG CR tablet Take 650 mg by mouth every 8 (eight) hours as needed for pain.     amLODipine (NORVASC) 10 MG tablet Take 10 mg by mouth daily.      chlorthalidone (HYGROTON) 25 MG tablet Take 1 tablet (25 mg total) by mouth daily. 90 tablet 1   ELIQUIS 5 MG TABS tablet Take 1 tablet by mouth twice daily 60 tablet 10   Evolocumab (REPATHA SURECLICK) 001 MG/ML SOAJ Inject 1 pen into the skin every 14 (fourteen) days. 6 mL 3   metoprolol succinate (TOPROL XL) 25 MG 24 hr tablet Take 1 tablet (25 mg total) by mouth daily. 90 tablet 3   oxyCODONE-acetaminophen (PERCOCET/ROXICET) 5-325 MG tablet Take 1 tablet by mouth every 4 (four) hours as needed for severe pain. 12 tablet 0   potassium chloride SA (KLOR-CON) 20 MEQ tablet TAKE 1 TABLET BY MOUTH IN THE MORNING AND 2 IN THE EVENING 270 tablet 1   PFIZER-BIONT COVID-19 VAC-TRIS SUSP injection      No facility-administered medications prior to visit.    Allergies  Allergen Reactions   Crestor [Rosuvastatin Calcium] Shortness Of Breath    Muscle cramps   Spironolactone Shortness Of Breath   Terazosin Shortness Of Breath, Palpitations and Other (See Comments)    Nerve pain   Amiodarone Other (See Comments)    Dropped pulse real low to 45-50, and made him feel very cold   Atenolol Other (See Comments)    Drowsiness and "flu sxs"   Bystolic [Nebivolol Hcl] Other (See Comments)    GI issues   Hydralazine Other (See Comments)    Joint swelling   Tizanidine Other (See Comments)    Syncope, Elevated BP/pulse   Amlodipine Other (See Comments)   Clonidine Other (See Comments)   Hydrocodone-Acetaminophen Other (See Comments)   Levofloxacin Other (See Comments)   Losartan Other (See Comments)    Did not work.  Pt states he couldn't take b/c of SE, but can't remember what SE were.   Maxzide [Hydrochlorothiazide W-Triamterene] Other (See Comments)    Does not tolerate potassium-sparing diuretics   Olmesartan Other (See Comments)   Tape Other (See Comments)    Other reaction(s): Unknown   Tussionex Pennkinetic Er [Hydrocod Polst-Cpm Polst Er] Other (See Comments)    Other reaction(s):  Unknown    ROS Review of Systems REFER TO HPI FOR PERTINENT POSITIVES AND NEGATIVES    Objective:    Physical Exam Vitals and nursing note reviewed.  Constitutional:      Appearance: Normal appearance. He is normal weight.  HENT:     Head: Normocephalic.  Right Ear: External ear normal.     Left Ear: External ear normal.  Eyes:     Extraocular Movements: Extraocular movements intact.     Conjunctiva/sclera: Conjunctivae normal.     Pupils: Pupils are equal, round, and reactive to light.  Cardiovascular:     Rate and Rhythm: Regular rhythm. Bradycardia present.     Heart sounds: Normal heart sounds. No murmur heard. Pulmonary:     Effort: Pulmonary effort is normal. No respiratory distress.     Breath sounds: Normal breath sounds.  Neurological:     Mental Status: He is alert.  Psychiatric:        Mood and Affect: Mood normal.        Behavior: Behavior normal.    BP 121/75   Pulse (!) 53   Temp 97.6 F (36.4 C)   Ht 5\' 8"  (1.727 m)   Wt 202 lb 4 oz (91.7 kg)   SpO2 97%   BMI 30.75 kg/m  Wt Readings from Last 3 Encounters:  05/26/21 202 lb 4 oz (91.7 kg)  05/19/21 205 lb 3.2 oz (93.1 kg)  03/25/21 207 lb 9.6 oz (94.2 kg)     There are no preventive care reminders to display for this patient.  There are no preventive care reminders to display for this patient.  Lab Results  Component Value Date   TSH 3.801 01/17/2021   Lab Results  Component Value Date   WBC 5.1 02/03/2021   HGB 16.8 02/03/2021   HCT 51.4 02/03/2021   MCV 89.2 02/03/2021   PLT 184 02/03/2021   Lab Results  Component Value Date   NA 139 02/03/2021   K 3.7 02/03/2021   CO2 27 02/03/2021   GLUCOSE 117 (H) 02/03/2021   BUN 16 02/03/2021   CREATININE 1.44 (H) 02/03/2021   BILITOT 1.8 (H) 04/13/2021   ALKPHOS 67 04/13/2021   AST 25 04/13/2021   ALT 25 04/13/2021   PROT 7.1 04/13/2021   ALBUMIN 4.4 04/13/2021   CALCIUM 9.3 02/03/2021   ANIONGAP 10 02/03/2021   GFR 58.48 (L)  10/27/2020   Lab Results  Component Value Date   CHOL 154 04/13/2021   Lab Results  Component Value Date   HDL 55 04/13/2021   Lab Results  Component Value Date   LDLCALC 83 04/13/2021   Lab Results  Component Value Date   TRIG 87 04/13/2021   Lab Results  Component Value Date   CHOLHDL 2.8 04/13/2021   Lab Results  Component Value Date   HGBA1C 5.8 (H) 01/17/2021      Assessment & Plan:   Problem List Items Addressed This Visit   None   No orders of the defined types were placed in this encounter.   Follow-up: No follow-ups on file.   1. Polycythemia CBC today and will call with results, plan from there.   2. Paroxysmal atrial fibrillation ([place]) 3. Essential hypertension His blood pressure looks great at today's visit, but patient notes that he has not taken his medication yet today.  He is slightly bradycardic today, but his rhythm is regular.  I did review his note from Dr. [name] on 05/19/2021.  I discussed with patient as he has a CHA2DS2-VASc score of 4, including prior history of CVA in February 2022, as well as paroxysmal A. Fib/atrial flutter, he needs to continue his Eliquis.  Also discussed with patient about metoprolol keeping his rate controlled.  I asked him about amiodarone because this is listed in  his allergies, and he thinks he might still be taking this medication.  Because of this, I have asked him to bring all of his medications and to visit next week and we can carefully look at what his blood pressure and heart rate is doing, as well as make sure that he is taking all of his medications correctly.  Patient is agreeable and understanding.  This note was prepared with assistance of Systems analyst. Occasional wrong-word or sound-a-like substitutions may have occurred due to the inherent limitations of voice recognition software.   Min Tunnell M Manmeet Arzola, PA-C

## 2021-06-03 ENCOUNTER — Ambulatory Visit: Payer: Medicare HMO | Admitting: Physician Assistant

## 2021-06-05 ENCOUNTER — Telehealth: Payer: Self-pay | Admitting: Pharmacist

## 2021-06-05 NOTE — Chronic Care Management (AMB) (Addendum)
Chronic Care Management Pharmacy Assistant   Name: Patrick Roth  MRN: [id number] DOB: Dec 04, 1945  Reason for Encounter:Hypertension Disease State call   Recent office visits:  05/26/21-Alyssa Allwardt PA-C (PCP)- Office visit for recheck HTN and Afib and possible polycythemia. Patient advised to keep a BP log and bring the log and medications to appointment in 1 week. (Patient did not show for 1 week f/u)  Recent consult visits:  06/16/21-Jared Cira Servant MD (Orthopedic)- Office visit for left elbow pain and burning. No info available.  05/19/21-Will Camnitz MD (Cardiology)- Office visit for Afib. No medication changes. Follow up in 6 months.  04/17/21-Elizabeth Hollie Salk MD (Nephrology)-Data unavailable.  03/25/21-Pramod Sethi MD (Neurology)- Office visit for stroke. Advised to taper and DISCONTINUE Gabapentin.  Follow up in 3 months.   Hospital visits:  None in previous 6 months  Medications: Outpatient Encounter Medications as of 06/05/2021  Medication Sig   acetaminophen (TYLENOL) 650 MG CR tablet Take 650 mg by mouth every 8 (eight) hours as needed for pain.   amLODipine (NORVASC) 10 MG tablet Take 10 mg by mouth daily.   chlorthalidone (HYGROTON) 25 MG tablet Take 1 tablet (25 mg total) by mouth daily.   ELIQUIS 5 MG TABS tablet Take 1 tablet by mouth twice daily   Evolocumab (REPATHA SURECLICK) 601 MG/ML SOAJ Inject 1 pen into the skin every 14 (fourteen) days.   metoprolol succinate (TOPROL XL) 25 MG 24 hr tablet Take 1 tablet (25 mg total) by mouth daily.   oxyCODONE-acetaminophen (PERCOCET/ROXICET) 5-325 MG tablet Take 1 tablet by mouth every 4 (four) hours as needed for severe pain.   PFIZER-BIONT COVID-19 VAC-TRIS SUSP injection    potassium chloride SA (KLOR-CON) 20 MEQ tablet TAKE 1 TABLET BY MOUTH IN THE MORNING AND 2 IN THE EVENING   No facility-administered encounter medications on file as of 06/05/2021.   Reviewed chart prior to disease state call. Spoke with patient  regarding BP  Recent Office Vitals: BP Readings from Last 3 Encounters:  05/26/21 121/75  05/19/21 102/72  03/25/21 130/80   Pulse Readings from Last 3 Encounters:  05/26/21 (!) 53  05/19/21 (!) 46  03/25/21 63    Wt Readings from Last 3 Encounters:  05/26/21 202 lb 4 oz (91.7 kg)  05/19/21 205 lb 3.2 oz (93.1 kg)  03/25/21 207 lb 9.6 oz (94.2 kg)     Kidney Function Lab Results  Component Value Date/Time   CREATININE 1.44 (H) 02/03/2021 11:22 PM   CREATININE 1.38 (H) 01/17/2021 12:36 AM   CREATININE 1.27 (H) 05/15/2020 09:54 AM   CREATININE 1.37 (H) 02/13/2020 09:24 AM   CREATININE 1.20 (H) 10/25/2016 11:40 AM   CREATININE 1.36 (H) 09/14/2016 10:19 AM   GFR 58.48 (L) 10/27/2020 01:24 PM   GFRNONAA 50 (L) 02/03/2021 11:22 PM   GFRNONAA 55 (L) 05/15/2020 09:54 AM   GFRAA 58 (L) 01/07/2021 10:01 AM   GFRAA >60 05/15/2020 09:54 AM    BMP Latest Ref Rng & Units 02/03/2021 01/17/2021 01/16/2021  Glucose 70 - 99 mg/dL 117(H) 92 93  BUN 8 - 23 mg/dL 16 12 14   Creatinine 0.61 - 1.24 mg/dL 1.44(H) 1.38(H) 1.31(H)  BUN/Creat Ratio 10 - 24 - - -  Sodium 135 - 145 mmol/L 139 137 137  Potassium 3.5 - 5.1 mmol/L 3.7 3.9 3.2(L)  Chloride 98 - 111 mmol/L 102 100 98  CO2 22 - 32 mmol/L 27 27 29   Calcium 8.9 - 10.3 mg/dL 9.3 9.6 9.0  Current antihypertensive regimen:  Amlodipine 10 mg take 1 daily Chlorthalidone 25 mg take 1 daily Metoprolol 25mg  take 1 daily How often are you checking your Blood Pressure? twice daily Current home BP readings: 140/80 pulse 52 What recent interventions/DTPs have been made by any provider to improve Blood Pressure control since last CPP Visit: None Any recent hospitalizations or ED visits since last visit with CPP? No What diet changes have been made to improve Blood Pressure Control?  Diet is stable, avoiding salt, avoiding fatty foods.  What exercise is being done to improve your Blood Pressure Control?  Not much activity  Adherence  Review: Is the patient currently on ACE/ARB medication? No Does the patient have >5 day gap between last estimated fill dates? No Amlodipine 10mg   Last filled 04/06/21  90 DS Chlorthalidone 25mg   Last filled 04/06/21  90 DS Metoprolol 25mg  Last filled 03/26/21  90 DS  Star Rating Drugs: None  Glynda Reaves [place]  9 minutes spent in review, coordination, and documentation.  Reviewed by: Beverly Milch, PharmD Clinical Pharmacist [phone]

## 2021-06-09 ENCOUNTER — Other Ambulatory Visit: Payer: Self-pay | Admitting: Physician Assistant

## 2021-06-09 ENCOUNTER — Telehealth: Payer: Self-pay

## 2021-06-09 DIAGNOSIS — M5416 Radiculopathy, lumbar region: Secondary | ICD-10-CM

## 2021-06-09 NOTE — Telephone Encounter (Signed)
MEDICATION: gabapentin (NEURONTIN) 100 MG capsule  PHARMACY:  [place], [place] [address] Phone:  [phone]  Fax:  [phone]      Comments:   **Let patient know to contact pharmacy at the end of the day to make sure medication is ready. **  ** Please notify patient to allow 48-72 hours to process**  **Encourage patient to contact the pharmacy for refills or they can request refills through [facility]**

## 2021-06-10 NOTE — Telephone Encounter (Signed)
Patient notified,he will discuss at appt

## 2021-06-10 NOTE — Telephone Encounter (Signed)
Spoke with patient he would like to continue Gabapentin,he stated it works for him.

## 2021-06-16 ENCOUNTER — Other Ambulatory Visit: Payer: Self-pay

## 2021-06-16 DIAGNOSIS — M7712 Lateral epicondylitis, left elbow: Secondary | ICD-10-CM | POA: Diagnosis not present

## 2021-06-16 MED ORDER — APIXABAN 5 MG PO TABS: 5.0000 mg | ORAL_TABLET | Freq: Two times a day (BID) | ORAL | 1 refills | Status: DC

## 2021-06-16 NOTE — Telephone Encounter (Signed)
Prescription refill request for Eliquis received. Indication:afib Last office visit:camnitz 05/19/21 Scr:1.44 02/03/21 Age: 60mWeight:91.7kg

## 2021-06-18 ENCOUNTER — Other Ambulatory Visit: Payer: Self-pay

## 2021-06-18 ENCOUNTER — Ambulatory Visit (INDEPENDENT_AMBULATORY_CARE_PROVIDER_SITE_OTHER): Payer: Medicare HMO | Admitting: Physician Assistant

## 2021-06-18 ENCOUNTER — Encounter: Payer: Self-pay | Admitting: Physician Assistant

## 2021-06-18 ENCOUNTER — Ambulatory Visit: Payer: Medicare HMO | Admitting: Physician Assistant

## 2021-06-18 VITALS — BP 125/74 | HR 52 | Temp 97.2°F | Ht 68.0 in | Wt 202.2 lb

## 2021-06-18 DIAGNOSIS — I480 Paroxysmal atrial fibrillation: Secondary | ICD-10-CM

## 2021-06-18 DIAGNOSIS — H579 Unspecified disorder of eye and adnexa: Secondary | ICD-10-CM

## 2021-06-18 DIAGNOSIS — I10 Essential (primary) hypertension: Secondary | ICD-10-CM | POA: Diagnosis not present

## 2021-06-18 NOTE — Patient Instructions (Signed)
Ok to cancel appointment in September, and I will see you back for the CPE as scheduled next year.   Your blood pressures look great! Keep up the good work.  I'm glad you are feeling better off the gabapentin.  Call if any concerns.

## 2021-06-18 NOTE — Progress Notes (Signed)
Established Patient Office Visit  Subjective:  Patient ID: Patrick Roth, male    DOB: 1946-08-23  Age: 75 y.o. MRN: [id number]  CC:  Chief Complaint  Patient presents with   Medication Consultation    HPI [name] presents for medication review and blood pressure recheck. He has a BP log with him today and has been averaging around 124/70. HR is averaging around 48. He denies any dizziness or lightheadedness. No CP or SOB. No headaches or blurred vision. Says he is also feeling better since he weaned off the gabapentin.  Past Medical History:  Diagnosis Date   Atrial flutter ([place])    Colon polyps    Constipation 05/27/2015   DOE (dyspnea on exertion) 10/13/2015   Faintness 06/20/2016   History of chicken pox    Hyperlipemia 02/13/2016   Hyperlipidemia    Hypertension    Hypokalemia 06/28/2016   Hyponatremia 06/28/2016   Internal hemorrhoids    Irregular heartbeat    Malignant hypertension 07/15/2015   Sciatica    Stroke [facility]) 2008   Syncope and collapse 06/28/2016    Past Surgical History:  Procedure Laterality Date   CARDIOVERSION N/A 09/02/2017   Procedure: CARDIOVERSION;  Surgeon: [name], MD;  Location: [facility] ENDOSCOPY;  Service: Cardiovascular;  Laterality: N/A;   COLONOSCOPY  2011   great toe surgery     PROSTATE BIOPSY      Family History  Problem Relation Age of Onset   Hypertension Father 17       Deceased   Heart disease Father    Lung cancer Mother 105       Deceased   Healthy Sister        x3   Healthy Brother        x4   Heart disease Brother        #5   Other Daughter        Alpha Thalassemia   Lupus Daughter        #2-deceased   Diabetes Neg Hx    Heart attack Neg Hx    Hyperlipidemia Neg Hx    Sudden death Neg Hx     Social History   Socioeconomic History   Marital status: Married    Spouse name: [name]   Number of children: 1   Years of education: 12   Highest education level: Not on file  Occupational History    Occupation: Retired  Tobacco Use   Smoking status: Never   Smokeless tobacco: Never  Vaping Use   Vaping Use: Never used  Substance and Sexual Activity   Alcohol use: No   Drug use: No   Sexual activity: Not on file  Other Topics Concern   Not on file  Social History Narrative   Lives with wife   Right Handed   Drinks no caffeine   Social Determinants of Health   Financial Resource Strain: Low Risk    Difficulty of Paying Living Expenses: Not hard at all  Food Insecurity: No Food Insecurity   Worried About Charity fundraiser in the Last Year: Never true   Arboriculturist in the Last Year: Never true  Transportation Needs: No Transportation Needs   Lack of Transportation (Medical): No   Lack of Transportation (Non-Medical): No  Physical Activity: Sufficiently Active   Days of Exercise per Week: 7 days   Minutes of Exercise per Session: 30 min  Stress: No Stress Concern Present   Feeling  of Stress : Not at all  Social Connections: Moderately Isolated   Frequency of Communication with Friends and Family: More than three times a week   Frequency of Social Gatherings with Friends and Family: Once a week   Attends Religious Services: Never   Marine scientist or Organizations: No   Attends Music therapist: Never   Marital Status: Married  Human resources officer Violence: Not At Risk   Fear of Current or Ex-Partner: No   Emotionally Abused: No   Physically Abused: No   Sexually Abused: No    Outpatient Medications Prior to Visit  Medication Sig Dispense Refill   acetaminophen (TYLENOL) 650 MG CR tablet Take 650 mg by mouth every 8 (eight) hours as needed for pain.     amLODipine (NORVASC) 10 MG tablet Take 10 mg by mouth daily.     apixaban (ELIQUIS) 5 MG TABS tablet Take 1 tablet (5 mg total) by mouth 2 (two) times daily. 180 tablet 1   chlorthalidone (HYGROTON) 25 MG tablet Take 1 tablet (25 mg total) by mouth daily. 90 tablet 1   Evolocumab (REPATHA  SURECLICK) XX123456 MG/ML SOAJ Inject 1 pen into the skin every 14 (fourteen) days. 6 mL 3   metoprolol succinate (TOPROL XL) 25 MG 24 hr tablet Take 1 tablet (25 mg total) by mouth daily. 90 tablet 3   oxyCODONE-acetaminophen (PERCOCET/ROXICET) 5-325 MG tablet Take 1 tablet by mouth every 4 (four) hours as needed for severe pain. 12 tablet 0   potassium chloride SA (KLOR-CON) 20 MEQ tablet TAKE 1 TABLET BY MOUTH IN THE MORNING AND 2 IN THE EVENING 270 tablet 1   PFIZER-BIONT COVID-19 VAC-TRIS SUSP injection      No facility-administered medications prior to visit.    Allergies  Allergen Reactions   Crestor [Rosuvastatin Calcium] Shortness Of Breath    Muscle cramps   Spironolactone Shortness Of Breath   Terazosin Shortness Of Breath, Palpitations and Other (See Comments)    Nerve pain   Amiodarone Other (See Comments)    Dropped pulse real low to 45-50, and made him feel very cold   Atenolol Other (See Comments)    Drowsiness and "flu sxs"   Bystolic [Nebivolol Hcl] Other (See Comments)    GI issues   Hydralazine Other (See Comments)    Joint swelling   Tizanidine Other (See Comments)    Syncope, Elevated BP/pulse   Amlodipine Other (See Comments)   Clonidine Other (See Comments)   Hydrocodone-Acetaminophen Other (See Comments)   Levofloxacin Other (See Comments)   Losartan Other (See Comments)    Did not work.  Pt states he couldn't take b/c of SE, but can't remember what SE were.   Maxzide [Hydrochlorothiazide W-Triamterene] Other (See Comments)    Does not tolerate potassium-sparing diuretics   Olmesartan Other (See Comments)   Tape Other (See Comments)    Other reaction(s): Unknown   Tussionex Pennkinetic Er [Hydrocod Polst-Cpm Polst Er] Other (See Comments)    Other reaction(s): Unknown    ROS Review of Systems REFER TO HPI FOR PERTINENT POSITIVES AND NEGATIVES    Objective:    Physical Exam Vitals and nursing note reviewed.  Constitutional:      Appearance: Normal  appearance. He is normal weight.  HENT:     Head: Normocephalic.     Right Ear: External ear normal.     Left Ear: External ear normal.  Eyes:     Extraocular Movements: Extraocular movements intact.  Conjunctiva/sclera: Conjunctivae normal.     Pupils: Pupils are equal, round, and reactive to light.  Cardiovascular:     Rate and Rhythm: Regular rhythm. Bradycardia present.     Heart sounds: Normal heart sounds. No murmur heard. Pulmonary:     Effort: Pulmonary effort is normal. No respiratory distress.     Breath sounds: Normal breath sounds.  Neurological:     Mental Status: He is alert.  Psychiatric:        Mood and Affect: Mood normal.        Behavior: Behavior normal.    BP 125/74   Pulse (!) 52   Temp (!) 97.2 F (36.2 C)   Ht '5\' 8"'$  (1.727 m)   Wt 202 lb 3.2 oz (91.7 kg)   BMI 30.74 kg/m  Wt Readings from Last 3 Encounters:  06/18/21 202 lb 3.2 oz (91.7 kg)  05/26/21 202 lb 4 oz (91.7 kg)  05/19/21 205 lb 3.2 oz (93.1 kg)     Health Maintenance Due  Topic Date Due   COVID-19 Vaccine (4 - Booster for Pfizer series) 12/07/2020    There are no preventive care reminders to display for this patient.  Lab Results  Component Value Date   TSH 3.801 01/17/2021   Lab Results  Component Value Date   WBC 4.1 05/26/2021   HGB 15.6 05/26/2021   HCT 47.2 05/26/2021   MCV 90.1 05/26/2021   PLT 150.0 05/26/2021   Lab Results  Component Value Date   NA 139 02/03/2021   K 3.7 02/03/2021   CO2 27 02/03/2021   GLUCOSE 117 (H) 02/03/2021   BUN 16 02/03/2021   CREATININE 1.44 (H) 02/03/2021   BILITOT 1.8 (H) 04/13/2021   ALKPHOS 67 04/13/2021   AST 25 04/13/2021   ALT 25 04/13/2021   PROT 7.1 04/13/2021   ALBUMIN 4.4 04/13/2021   CALCIUM 9.3 02/03/2021   ANIONGAP 10 02/03/2021   GFR 58.48 (L) 10/27/2020   Lab Results  Component Value Date   CHOL 154 04/13/2021   Lab Results  Component Value Date   HDL 55 04/13/2021   Lab Results  Component Value  Date   LDLCALC 83 04/13/2021   Lab Results  Component Value Date   TRIG 87 04/13/2021   Lab Results  Component Value Date   CHOLHDL 2.8 04/13/2021   Lab Results  Component Value Date   HGBA1C 5.8 (H) 01/17/2021      Assessment & Plan:   Problem List Items Addressed This Visit       Cardiovascular and Mediastinum   Essential hypertension - Primary   Paroxysmal atrial fibrillation ([place])   Other Visit Diagnoses     Eye problem       Relevant Orders   Ambulatory referral to Ophthalmology       No orders of the defined types were placed in this encounter.   Follow-up: No follow-ups on file.   1. Essential hypertension 2. Paroxysmal atrial fibrillation ([facility]) Reviewed medications with patient today. He is doing well, stable on current regimen. BP is at goal. Amlodipine 10 mg, Chlorthalidone 25 mg, Toprol XL 25 mg, Eliquis 5 mg BID.  3. Eye problem Referral to ophthalmology per patient request. He has had years of eye irritation, especially worse in the summer, without any relief.   Wasim Hurlbut M Doni Widmer, PA-C

## 2021-07-02 ENCOUNTER — Ambulatory Visit: Payer: Medicare HMO | Admitting: Neurology

## 2021-07-20 DIAGNOSIS — M47816 Spondylosis without myelopathy or radiculopathy, lumbar region: Secondary | ICD-10-CM | POA: Diagnosis not present

## 2021-07-20 DIAGNOSIS — M5136 Other intervertebral disc degeneration, lumbar region: Secondary | ICD-10-CM | POA: Diagnosis not present

## 2021-07-20 DIAGNOSIS — M5416 Radiculopathy, lumbar region: Secondary | ICD-10-CM | POA: Diagnosis not present

## 2021-07-20 DIAGNOSIS — M5412 Radiculopathy, cervical region: Secondary | ICD-10-CM | POA: Diagnosis not present

## 2021-07-30 ENCOUNTER — Ambulatory Visit: Payer: Medicare HMO | Admitting: Cardiology

## 2021-07-31 ENCOUNTER — Ambulatory Visit: Payer: Medicare HMO | Admitting: Physician Assistant

## 2021-07-31 ENCOUNTER — Other Ambulatory Visit (HOSPITAL_COMMUNITY): Payer: Self-pay | Admitting: Physician Assistant

## 2021-08-05 ENCOUNTER — Telehealth: Payer: Self-pay | Admitting: Pharmacist

## 2021-08-05 DIAGNOSIS — M5136 Other intervertebral disc degeneration, lumbar region: Secondary | ICD-10-CM | POA: Diagnosis not present

## 2021-08-05 DIAGNOSIS — I10 Essential (primary) hypertension: Secondary | ICD-10-CM | POA: Diagnosis not present

## 2021-08-05 DIAGNOSIS — M47816 Spondylosis without myelopathy or radiculopathy, lumbar region: Secondary | ICD-10-CM | POA: Diagnosis not present

## 2021-08-05 NOTE — Chronic Care Management (AMB) (Addendum)
Chronic Care Management Pharmacy Assistant   Name: Patrick Roth  MRN: [id number] DOB: 01/06/1946   Reason for Encounter: Hypertension Adherence Call    Recent office visits:  06/18/2021 [facility] (PCP) Allwardt, [name], PA-C; chronic follow up, no medication changes indicated.  Recent consult visits:  07/20/2021 OV (orthopaedic, sports medicine) Begovich, Geradine Girt, MD; no further information available.  06/16/2021 [facility] (orthopaedic, sports medicine) Burman Freestone, MD; no further information available.  Hospital visits:  None in previous 6 months  Medications: Outpatient Encounter Medications as of 08/05/2021  Medication Sig   acetaminophen (TYLENOL) 650 MG CR tablet Take 650 mg by mouth every 8 (eight) hours as needed for pain.   amLODipine (NORVASC) 10 MG tablet Take 10 mg by mouth daily.   apixaban (ELIQUIS) 5 MG TABS tablet Take 1 tablet (5 mg total) by mouth 2 (two) times daily.   chlorthalidone (HYGROTON) 25 MG tablet Take 1 tablet (25 mg total) by mouth daily.   Evolocumab (REPATHA SURECLICK) [id number] MG/ML SOAJ Inject 1 pen into the skin every 14 (fourteen) days.   metoprolol succinate (TOPROL-XL) 25 MG 24 hr tablet Take 1 tablet (25 mg total) by mouth daily.   oxyCODONE-acetaminophen (PERCOCET/ROXICET) 5-325 MG tablet Take 1 tablet by mouth every 4 (four) hours as needed for severe pain.   potassium chloride SA (KLOR-CON) 20 MEQ tablet TAKE 1 TABLET BY MOUTH IN THE MORNING AND 2 IN THE EVENING   No facility-administered encounter medications on file as of 08/05/2021.   Reviewed chart prior to disease state call. Spoke with patient regarding BP  Recent Office Vitals: BP Readings from Last 3 Encounters:  06/18/21 125/74  05/26/21 121/75  05/19/21 102/72   Pulse Readings from Last 3 Encounters:  06/18/21 (!) 52  05/26/21 (!) 53  05/19/21 (!) 46    Wt Readings from Last 3 Encounters:  06/18/21 202 lb 3.2 oz (91.7 kg)  05/26/21 202 lb 4 oz (91.7 kg)  05/19/21 205  lb 3.2 oz (93.1 kg)     Kidney Function Lab Results  Component Value Date/Time   CREATININE 1.44 (H) 02/03/2021 11:22 PM   CREATININE 1.38 (H) 01/17/2021 12:36 AM   CREATININE 1.27 (H) 05/15/2020 09:54 AM   CREATININE 1.37 (H) 02/13/2020 09:24 AM   CREATININE 1.20 (H) 10/25/2016 11:40 AM   CREATININE 1.36 (H) 09/14/2016 10:19 AM   GFR 58.48 (L) 10/27/2020 01:24 PM   GFRNONAA 50 (L) 02/03/2021 11:22 PM   GFRNONAA 55 (L) 05/15/2020 09:54 AM   GFRAA 58 (L) 01/07/2021 10:01 AM   GFRAA >60 05/15/2020 09:54 AM    BMP Latest Ref Rng & Units 02/03/2021 01/17/2021 01/16/2021  Glucose 70 - 99 mg/dL 117(H) 92 93  BUN 8 - 23 mg/dL '16 12 14  '$ Creatinine 0.61 - 1.24 mg/dL 1.44(H) 1.38(H) 1.31(H)  BUN/Creat Ratio 10 - 24 - - -  Sodium 135 - 145 mmol/L 139 137 137  Potassium 3.5 - 5.1 mmol/L 3.7 3.9 3.2(L)  Chloride 98 - 111 mmol/L 102 100 98  CO2 22 - 32 mmol/L '27 27 29  '$ Calcium 8.9 - 10.3 mg/dL 9.3 9.6 9.0    Current antihypertensive regimen:  chlorthalidone  25 mg once a day amLODipine 10 mg once a day metoprolol succinate 25 mg once a day  Patient states chlorthalidone 25 mg makes him feel bad when he takes it at night. He states this medication makes him feel dizzy in the mornings when he takes it at night so  he started taking this during the day instead. He states his symptoms have overall improved since taking it during the day. He would like to know if there are any alternatives to this medication that would benefit him without causing dizziness.  How often are you checking your Blood Pressure? 1-2x per week  Current home BP readings: 125/71  What recent interventions/DTPs have been made by any provider to improve Blood Pressure control since last CPP Visit: No inter  Any recent hospitalizations or ED visits since last visit with CPP? No  What diet changes have been made to improve Blood Pressure Control?  Patient states he likes to avoid sodium and tries to eat an overall healthy  diet.  What exercise is being done to improve your Blood Pressure Control?  Patient states he is active more than 30 minutes a day.  Adherence Review: Is the patient currently on ACE/ARB medication? No Does the patient have >5 day gap between last estimated fill dates? No   Care Gaps: Medicare Annual Wellness: Last AWV 10/06/2020 - scheduled next AWV for 10/12/2021 at 1:45 pm Hemoglobin A1C: 5.8% on 01/17/2021 Colonoscopy: Aged Out - last completed 08/26/2010  Future Appointments  Date Time Provider [place]  08/12/2021  2:00 PM Garvin Fila, MD [facility] None  09/07/2021  1:00 PM [facility] CCM PHARMACIST [facility]  10/12/2021  1:45 PM [facility] HEALTH COACH [facility]  11/12/2021  9:45 AM Constance Haw, MD [facility]  02/15/2022 10:30 AM Allwardt, [name], PA-C [facility]  02/18/2022  2:30 PM [facility] None  02/18/2022  3:00 PM Orson Slick, MD [facility] None    Star Rating Drugs: None  April D Calhoun, [place] Pharmacist Assistant [phone]   7 minutes spent in review, coordination, and documentation. Patient may be experiencing dizziness from his lower pulse.  I see it has been in the low 50's and 40s lately at the office.  Can we see if he can check BP/Pulse at home.  It is possible his dizziness is being caused by low BP or pulse vs an individual medication.  Reviewed by: Beverly Milch, PharmD Clinical Pharmacist [phone]

## 2021-08-12 ENCOUNTER — Encounter: Payer: Self-pay | Admitting: Neurology

## 2021-08-12 ENCOUNTER — Ambulatory Visit: Payer: Medicare HMO | Admitting: Neurology

## 2021-08-12 VITALS — BP 122/80 | HR 111 | Ht 68.0 in | Wt 196.0 lb

## 2021-08-12 DIAGNOSIS — I480 Paroxysmal atrial fibrillation: Secondary | ICD-10-CM

## 2021-08-12 DIAGNOSIS — D751 Secondary polycythemia: Secondary | ICD-10-CM

## 2021-08-12 DIAGNOSIS — I63412 Cerebral infarction due to embolism of left middle cerebral artery: Secondary | ICD-10-CM

## 2021-08-12 NOTE — Patient Instructions (Signed)
I had a long d/w patient and his wife about his recent stroke, atrial fibrillation, polycythemia risk for recurrent stroke/TIAs, personally independently reviewed imaging studies and stroke evaluation results and answered questions.Continue Eliquis (apixaban) 5 mg twice daily  for secondary stroke prevention and maintain strict control of hypertension with blood pressure goal below 130/90, diabetes with hemoglobin A1c goal below 6.5% and lipids with LDL cholesterol goal below 70 mg/dL. I also advised the patient to eat a healthy diet with plenty of whole grains, cereals, fruits and vegetables, exercise regularly and maintain ideal body weight.  Recommended follow-up with Dr. Lorenso Courier hematologist for his polycythemia evaluation and treatment.  Followup in the future with me in 1 year or call earlier if necessary.

## 2021-08-12 NOTE — Progress Notes (Signed)
[facility] [address] [place]. [place] [address] [phone]       OFFICE FOLLOW UP VISIT NOTE  Mr. Patrick Roth Date of Birth:  [birth year] Medical Record Number:  [id number]   Referring MD: Barb Merino Reason for Referral: Stroke  HPI: Initial visit 03/25/2021 :Mr. Delima is a 75 year old pleasant African-American male seen today for initial office consultation visit for stroke.  History is obtained from the patient and his wife and review of electronic medical records and I personally reviewed available pertinent imaging films in PACS.  He has past medical history history significant for hypertension, hyperlipidemia, atrial flutter on Eliquis, prior stroke with mild residual right-sided weakness, sciatica.  He presented on 01/16/2021 with sudden onset of numbness and right leg from the knee down as well as right arm and face.  Started improving with days sought medical help and evaluation he was found to have any only minimum numbness and was not given tPA CT head was unremarkable.  MRI scan was obtained which showed left frontoparietal cortical embolic infarct.  CT angiogram of the brain and neck showed only 30% extracranial right carotid stenosis no significant intracranial stenosis.  Echocardiogram showed ejection fraction of 55% without cardiac source of embolism.  He had previously had a 7-day Holter monitor in 09/29/2020 which had shown atrial fibrillation and flutter.  LDL cholesterol was 201 mg percent and hemoglobin A1c was 5.8.  Patient was also found to have polycythemia and hematocrit was 57.3 on 02/03/2021.  Lab test for JA K2 mutation was negative.  Patient does have history of sarcoidosis and has seen hematologist Dr. 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  His atrial fibrillation was diagnosed 4 years ago and he was initially on Xarelto but for unclear reason has since been switched to Eliquis.   His hyperlipidemia has not responded to statins and he has switched to Martell which he started after his recent stroke and is tolerating it well without any injection site reactions.  Patient states his numbness is mostly resolved resolved but he does have some tiredness which has persisted. Update 08/12/2021 : He returns for follow-up after last visit 4 months ago.  He is accompanied by his wife.  He is doing well has not had any recurrent stroke or TIA symptoms.  He remains on Eliquis which is tolerating well without bleeding or bruising.  Blood his blood pressure is under good control today it is 122/80.  He is tolerating Repatha well and follow-up lipid profile on 03/25/1969 showed LDL cholesterol to be 70 mg percent.  He has tapered and discontinued gabapentin as instructed.  Patient has switched taking chlorthalidone to daytime as it made him quite tired at night.  He has been having back pain and has received 2 shots in his back for arthritic pain by pain medicine doctor in [facility].  He has no new complaints today. ROS:   14 system review of systems is positive for numbness, shortness of breath, bruising and all other systems negative  PMH:  Past Medical History:  Diagnosis Date   Atrial flutter (HCC)    Colon polyps    Constipation 05/27/2015   DOE (dyspnea on exertion) 10/13/2015   Faintness 06/20/2016   History of chicken pox    Hyperlipemia 02/13/2016   Hyperlipidemia    Hypertension    Hypokalemia 06/28/2016   Hyponatremia 06/28/2016   Internal hemorrhoids    Irregular heartbeat  Malignant hypertension 07/15/2015   Sciatica    Stroke [facility]) 2008   Syncope and collapse 06/28/2016    Social History:  Social History   Socioeconomic History   Marital status: Married    Spouse name: [name]   Number of children: 1   Years of education: 12   Highest education level: Not on file  Occupational History   Occupation: Retired  Tobacco Use   Smoking status: Never   Smokeless tobacco:  Never  Vaping Use   Vaping Use: Never used  Substance and Sexual Activity   Alcohol use: No   Drug use: No   Sexual activity: Not on file  Other Topics Concern   Not on file  Social History Narrative   Lives with wife   Right Handed   Drinks no caffeine   Social Determinants of Health   Financial Resource Strain: Low Risk    Difficulty of Paying Living Expenses: Not hard at all  Food Insecurity: No Food Insecurity   Worried About Charity fundraiser in the Last Year: Never true   Arboriculturist in the Last Year: Never true  Transportation Needs: No Transportation Needs   Lack of Transportation (Medical): No   Lack of Transportation (Non-Medical): No  Physical Activity: Sufficiently Active   Days of Exercise per Week: 7 days   Minutes of Exercise per Session: 30 min  Stress: No Stress Concern Present   Feeling of Stress : Not at all  Social Connections: Moderately Isolated   Frequency of Communication with Friends and Family: More than three times a week   Frequency of Social Gatherings with Friends and Family: Once a week   Attends Religious Services: Never   Marine scientist or Organizations: No   Attends Music therapist: Never   Marital Status: Married  Human resources officer Violence: Not At Risk   Fear of Current or Ex-Partner: No   Emotionally Abused: No   Physically Abused: No   Sexually Abused: No    Medications:   Current Outpatient Medications on File Prior to Visit  Medication Sig Dispense Refill   acetaminophen (TYLENOL) 650 MG CR tablet Take 650 mg by mouth every 8 (eight) hours as needed for pain.     amLODipine (NORVASC) 10 MG tablet Take 10 mg by mouth daily.     apixaban (ELIQUIS) 5 MG TABS tablet Take 1 tablet (5 mg total) by mouth 2 (two) times daily. 180 tablet 1   chlorthalidone (HYGROTON) 25 MG tablet Take 1 tablet (25 mg total) by mouth daily. 90 tablet 1   Evolocumab (REPATHA SURECLICK) 712 MG/ML SOAJ Inject 1 pen into the skin  every 14 (fourteen) days. 6 mL 3   metoprolol succinate (TOPROL-XL) 25 MG 24 hr tablet Take 1 tablet (25 mg total) by mouth daily. 90 tablet 2   oxyCODONE-acetaminophen (PERCOCET/ROXICET) 5-325 MG tablet Take 1 tablet by mouth every 4 (four) hours as needed for severe pain. 12 tablet 0   potassium chloride SA (KLOR-CON) 20 MEQ tablet TAKE 1 TABLET BY MOUTH IN THE MORNING AND 2 IN THE EVENING 270 tablet 1   No current facility-administered medications on file prior to visit.    Allergies:   Allergies  Allergen Reactions   Crestor [Rosuvastatin Calcium] Shortness Of Breath    Muscle cramps   Spironolactone Shortness Of Breath   Terazosin Shortness Of Breath, Palpitations and Other (See Comments)    Nerve pain   Amiodarone Other (See Comments)  Dropped pulse real low to 45-50, and made him feel very cold   Atenolol Other (See Comments)    Drowsiness and "flu sxs"   Bystolic [Nebivolol Hcl] Other (See Comments)    GI issues   Hydralazine Other (See Comments)    Joint swelling   Tizanidine Other (See Comments)    Syncope, Elevated BP/pulse   Amlodipine Other (See Comments)   Clonidine Other (See Comments)   Hydrocodone-Acetaminophen Other (See Comments)   Levofloxacin Other (See Comments)   Losartan Other (See Comments)    Did not work.  Pt states he couldn't take b/c of SE, but can't remember what SE were.   Maxzide [Hydrochlorothiazide W-Triamterene] Other (See Comments)    Does not tolerate potassium-sparing diuretics   Olmesartan Other (See Comments)   Tape Other (See Comments)    Other reaction(s): Unknown   Tussionex Pennkinetic Er [Hydrocod Polst-Cpm Polst Er] Other (See Comments)    Other reaction(s): Unknown    Physical Exam General: well developed, well nourished elderly African-American male, seated, in no evident distress Head: head normocephalic and atraumatic.   Neck: supple with no carotid or supraclavicular bruits Cardiovascular: regular rate and rhythm, no  murmurs Musculoskeletal: no deformity Skin:  no rash/petichiae Vascular:  Normal pulses all extremities  Neurologic Exam Mental Status: Awake and fully alert. Oriented to place and time. Recent and remote memory intact. Attention span, concentration and fund of knowledge appropriate. Mood and affect appropriate.  Cranial Nerves: Fundoscopic exam not done s. Pupils equal, briskly reactive to light. Extraocular movements full without nystagmus. Visual fields full to confrontation. Hearing intact. Facial sensation intact. Face, tongue, palate moves normally and symmetrically.  Motor: Normal bulk and tone. Normal strength in all tested extremity muscles. Sensory.: intact to touch , pinprick , position and vibratory sensation except patchy hypoesthesia in right corner of the mouth and right hand.  Coordination: Rapid alternating movements normal in all extremities. Finger-to-nose and heel-to-shin performed accurately bilaterally. Gait and Station: Arises from chair without difficulty. Stance is normal. Gait demonstrates normal stride length and balance . Able to heel, toe and tandem walk with moderate difficulty.  Reflexes: 1+ and symmetric. Toes downgoing.       ASSESSMENT: 75 year old African-American male with left frontal MCA branch embolic infarcts secondary to atrial fibrillation and polycythemia in February 2022 He is doing well and stable from neurovascular standpoint    PLAN: I had a long d/w patient and his wife about his recent stroke, atrial fibrillation, polycythemia risk for recurrent stroke/TIAs, personally independently reviewed imaging studies and stroke evaluation results and answered questions.Continue Eliquis (apixaban) 5 mg twice daily  for secondary stroke prevention and maintain strict control of hypertension with blood pressure goal below 130/90, diabetes with hemoglobin A1c goal below 6.5% and lipids with LDL cholesterol goal below 70 mg/dL. I also advised the patient to  eat a healthy diet with plenty of whole grains, cereals, fruits and vegetables, exercise regularly and maintain ideal body weight.  Recommended follow-up with Dr. Lorenso Courier hematologist for his polycythemia evaluation and treatment.  Followup in the future with me in 1 year or call earlier if necessary. Greater than 50% time during this 35-minute   visit was spent on counseling and coordination of care about his embolic stroke, atrial fibrillation, polycythemia and answering questions.  Antony Contras MD  Note: This document was prepared with digital dictation and possible smart phrase technology. Any transcriptional errors that result from this process are unintentional.

## 2021-08-14 ENCOUNTER — Ambulatory Visit: Payer: Medicare HMO | Admitting: Physician Assistant

## 2021-08-14 ENCOUNTER — Telehealth: Payer: Self-pay

## 2021-08-14 NOTE — Telephone Encounter (Signed)
Left voice message for patient to call clinic.  

## 2021-08-14 NOTE — Telephone Encounter (Signed)
PT came in to speak to [place] about his blood pressure. He currently sees Dr Lorenso Courier and [place] is not happy with him. Tregan stated that he believes that Dr Lorenso Courier does not care about his blood pressure. Khaliel would like to know if he can be referred to someone else. Please Advise.

## 2021-08-14 NOTE — Telephone Encounter (Signed)
Informed Patient a new referral will be placed

## 2021-08-18 ENCOUNTER — Telehealth: Payer: Self-pay

## 2021-08-18 NOTE — Telephone Encounter (Signed)
Spoke with Mr.Alessandrini he will see Sports medicine tomorrow.

## 2021-08-18 NOTE — Telephone Encounter (Signed)
Pt called in stating that he received some vaccines on 9/14 in his back at Sports Medicine. Patrick Roth stated that Dr Lynden Oxford has given him injections in the past for pain. Patrick Roth now thinks that he has a reaction to the vaccines. He stated that his fingers on his right hand are getting stuck as well as his toes on his right foot. Pt is scheduled for tomorrow. Is there anything else that needs to be done before appt?

## 2021-08-18 NOTE — Telephone Encounter (Signed)
Left voice message for patient to call clinic.  

## 2021-08-19 ENCOUNTER — Ambulatory Visit: Payer: Medicare HMO | Admitting: Physician Assistant

## 2021-08-19 DIAGNOSIS — M5136 Other intervertebral disc degeneration, lumbar region: Secondary | ICD-10-CM | POA: Diagnosis not present

## 2021-08-19 DIAGNOSIS — M5416 Radiculopathy, lumbar region: Secondary | ICD-10-CM | POA: Diagnosis not present

## 2021-08-19 DIAGNOSIS — M47816 Spondylosis without myelopathy or radiculopathy, lumbar region: Secondary | ICD-10-CM | POA: Diagnosis not present

## 2021-08-31 NOTE — Progress Notes (Signed)
Chronic Care Management Pharmacy Note  09/07/2021 Name:  Patrick Roth MRN:  [id number] DOB:  1946-06-12  Subjective: Patrick Roth is an 75 y.o. year old male who is a primary patient of Allwardt, [name], PA-C.  The CCM team was consulted for assistance with disease management and care coordination needs.    Engaged with patient by telephone for follow up visit in response to provider referral for pharmacy case management and/or care coordination services.   Consent to Services:  The patient was given information about Chronic Care Management services, agreed to services, and gave verbal consent prior to initiation of services.  Please see initial visit note for detailed documentation.   Patient Care Team: Allwardt, [name], PA-C as PCP - General (Physician Assistant) Constance Haw, MD as PCP - Electrophysiology (Cardiology) [name], Wonda Cheng, MD as PCP - Cardiology (Cardiology) Irene Shipper, MD as Consulting Physician (Gastroenterology) [name], Wonda Cheng, MD as Consulting Physician (Cardiology) Christy Sartorius, MD as Referring Physician (Urology) Inocencio Homes, [place] as Consulting Physician (Podiatry) Myrle Sheng, MD as Referring Physician (Neurosurgery) Begovich, Geradine Girt, DO (Sports Medicine) Edythe Clarity, [facility] (Pharmacist)  Recent office visits:  06/18/2021 [facility] (PCP) Allwardt, [name], PA-C; chronic follow up, no medication changes indicated.   Recent consult visits:  07/20/2021 [facility] (orthopaedic, sports medicine) Begovich, Geradine Girt, MD; no further information available.   06/16/2021 [facility] (orthopaedic, sports medicine) Burman Freestone, MD; no further information available.   Hospital visits:  None in previous 6 months   Objective: BMP Latest Ref Rng & Units 02/03/2021 01/17/2021 01/16/2021  Glucose 70 - 99 mg/dL 117(H) 92 93  BUN 8 - 23 mg/dL 16 12 14   Creatinine 0.61 - 1.24 mg/dL 1.44(H) 1.38(H) 1.31(H)  BUN/Creat Ratio 10 - 24 - - -   Sodium 135 - 145 mmol/L 139 137 137  Potassium 3.5 - 5.1 mmol/L 3.7 3.9 3.2(L)  Chloride 98 - 111 mmol/L 102 100 98  CO2 22 - 32 mmol/L 27 27 29   Calcium 8.9 - 10.3 mg/dL 9.3 9.6 9.0   Lab Results  Component Value Date   CREATININE 1.44 (H) 02/03/2021   CREATININE 1.38 (H) 01/17/2021   CREATININE 1.31 (H) 01/16/2021    Lab Results  Component Value Date   HGBA1C 5.8 (H) 01/17/2021   Last diabetic Eye exam: No results found for: HMDIABEYEEXA  Last diabetic Foot exam: No results found for: HMDIABFOOTEX      Component Value Date/Time   CHOL 154 04/13/2021 0933   TRIG 87 04/13/2021 0933   HDL 55 04/13/2021 0933   CHOLHDL 2.8 04/13/2021 0933   CHOLHDL 4.6 01/17/2021 0036   VLDL 11 01/17/2021 0036   LDLCALC 83 04/13/2021 0933    Hepatic Function Latest Ref Rng & Units 04/13/2021 02/03/2021 01/17/2021  Total Protein 6.0 - 8.5 g/dL 7.1 7.1 7.6  Albumin 3.7 - 4.7 g/dL 4.4 4.2 3.9  AST 0 - 40 IU/L 25 21 28   ALT 0 - 44 IU/L 25 22 30   Alk Phosphatase 44 - 121 IU/L 67 51 71  Total Bilirubin 0.0 - 1.2 mg/dL 1.8(H) 1.5(H) 3.2(H)  Bilirubin, Direct 0.00 - 0.40 mg/dL 0.41(H) - -    Lab Results  Component Value Date/Time   TSH 3.801 01/17/2021 12:36 AM   TSH 1.640 08/21/2020 11:03 AM   TSH 0.936 11/14/2019 10:58 AM   TSH 2.400 04/20/2019 09:19 AM    CBC Latest Ref Rng & Units 05/26/2021 02/03/2021 01/17/2021  WBC 4.0 - 10.5 K/uL 4.1 5.1 6.5  Hemoglobin 13.0 - 17.0 g/dL 15.6 16.8 18.7(H)  Hematocrit 39.0 - 52.0 % 47.2 51.4 57.3(H)  Platelets 150.0 - 400.0 K/uL 150.0 184 172   No results found for: VD25OH  Clinical ASCVD: Yes  The ASCVD Risk score (Arnett DK, et al., 2019) failed to calculate for the following reasons:   The patient has a prior MI or stroke diagnosis    Social History   Tobacco Use  Smoking Status Never  Smokeless Tobacco Never   BP Readings from Last 3 Encounters:  08/12/21 122/80  06/18/21 125/74  05/26/21 121/75   Pulse Readings from Last 3  Encounters:  08/12/21 (!) 111  06/18/21 (!) 52  05/26/21 (!) 53   Wt Readings from Last 3 Encounters:  08/12/21 196 lb (88.9 kg)  06/18/21 202 lb 3.2 oz (91.7 kg)  05/26/21 202 lb 4 oz (91.7 kg)   Assessment: Review of patient past medical history, allergies, medications, health status, including review of consultants reports, laboratory and other test data, was performed as part of comprehensive evaluation and provision of chronic care management services.   SDOH:  (Social Determinants of Health) assessments and interventions performed: Yes.   CCM Care Plan  Allergies  Allergen Reactions   Crestor [Rosuvastatin Calcium] Shortness Of Breath    Muscle cramps   Spironolactone Shortness Of Breath   Terazosin Shortness Of Breath, Palpitations and Other (See Comments)    Nerve pain   Amiodarone Other (See Comments)    Dropped pulse real low to 45-50, and made him feel very cold   Atenolol Other (See Comments)    Drowsiness and "flu sxs"   Bystolic [Nebivolol Hcl] Other (See Comments)    GI issues   Hydralazine Other (See Comments)    Joint swelling   Tizanidine Other (See Comments)    Syncope, Elevated BP/pulse   Amlodipine Other (See Comments)   Clonidine Other (See Comments)   Hydrocodone-Acetaminophen Other (See Comments)   Levofloxacin Other (See Comments)   Losartan Other (See Comments)    Did not work.  Pt states he couldn't take b/c of SE, but can't remember what SE were.   Maxzide [Hydrochlorothiazide W-Triamterene] Other (See Comments)    Does not tolerate potassium-sparing diuretics   Olmesartan Other (See Comments)   Tape Other (See Comments)    Other reaction(s): Unknown   Tussionex Pennkinetic Er [Hydrocod Polst-Cpm Polst Er] Other (See Comments)    Other reaction(s): Unknown    Medications Reviewed Today     Reviewed by Edythe Clarity, RPH (Pharmacist) on 09/07/21 at 1445  Med List Status: <None>   Medication Order Taking? Sig Documenting Provider  Last Dose Status Informant  acetaminophen (TYLENOL) 650 MG CR tablet [id number] Yes Take 650 mg by mouth every 8 (eight) hours as needed for pain. [provider] Taking Active   amLODipine (NORVASC) 10 MG tablet [id number] Yes Take 10 mg by mouth daily. [provider] Taking Active   apixaban (ELIQUIS) 5 MG TABS tablet [id number] Yes Take 1 tablet (5 mg total) by mouth 2 (two) times daily. Constance Haw, MD Taking Active   chlorthalidone (HYGROTON) 25 MG tablet [id number] Yes Take 1 tablet (25 mg total) by mouth daily. Allwardt, [name], PA-C Taking Active   Evolocumab (REPATHA SURECLICK) 196 MG/ML SOAJ [id number] Yes Inject 1 pen into the skin every 14 (fourteen) days. Camnitz, Ocie Doyne, MD Taking Active   metoprolol succinate (TOPROL-XL) 25 MG 24 hr  tablet [id number] Yes Take 1 tablet (25 mg total) by mouth daily. Constance Haw, MD Taking Active   oxyCODONE-acetaminophen (PERCOCET/ROXICET) 5-325 MG tablet [id number] Yes Take 1 tablet by mouth every 4 (four) hours as needed for severe pain. Malvin Johns, MD Taking Active            Med Note Nickolas Madrid   Thu Aug 21, 2020 10:19 AM)    potassium chloride SA (KLOR-CON) 20 MEQ tablet [id number] Yes TAKE 1 TABLET BY MOUTH IN THE MORNING AND 2 IN THE EVENING Allwardt, [name], PA-C Taking Active             Patient Active Problem List   Diagnosis Date Noted   Paroxysmal atrial fibrillation ([place]) 05/26/2021   Polycythemia 05/26/2021   Statin myopathy 01/29/2021   Acute arterial ischemic stroke, multifocal, anterior circulation, left ([place]) 01/16/2021   Secondary hypercoagulable state ([place]) 09/11/2020   Cyst of perineum in male 11/28/2018   Mixed hyperlipidemia    History of chicken pox    Colon polyps    Atrial flutter with rapid ventricular response ([place])    Other intervertebral disc degeneration, lumbar region 05/09/2017   Lumbar radiculopathy 03/29/2017   Persistent atrial fibrillation ([place])  09/14/2016   Diuretic-induced hypokalemia 06/28/2016   Essential hypertension 05/27/2015   Mallet deformity of fourth finger, right 07/11/2013   Stroke ([place]) 08/23/2012    Immunization History  Administered Date(s) Administered   Influenza,inj,Quad PF,6+ Mos 07/23/2015   Influenza-Unspecified 07/23/2018   PFIZER(Purple Top)SARS-COV-2 Vaccination 12/24/2019, 01/21/2020, 09/06/2020   Tdap 07/05/2013    Conditions to be addressed/monitored: Atrial Fibrillation, HTN and HLD  Care Plan : [place]  Updates made by Edythe Clarity, RPH since 09/07/2021 12:00 AM     Problem: HLD HTN AFib   Priority: High     Long-Range Goal: Patient-Specific Goal   Start Date: 03/04/2021  Expected End Date: 03/04/2022  Recent Progress: On track  Priority: High  Note:   Current Barriers:  Unable to maintain control of HLD - now on [place]):  Patient will verbalize ability to afford treatment regimen contact provider office for questions/concerns as evidenced notation of same in electronic health record through collaboration with PharmD and provider.   Interventions: 1:1 collaboration with Allwardt, [name], PA-C regarding development and update of comprehensive plan of care as evidenced by provider attestation and co-signature Inter-disciplinary care team collaboration (see longitudinal plan of care) Comprehensive medication review performed; medication list updated in electronic medical record No medication changes  Hypertension (BP goal <140/90) -Controlled -Current treatment: Amlodipine 10 mg daily Chlorthalidone 25 mg daily  -Denies issues with potassium tolerability, able to take 20 meq tablets 3x/day without a problem.   -Current home readings: when symptomatic  -Current dietary habits: no specific diet. 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 Admits to not doing the best with  hydradtion. -Denies hypotensive/hypertensive symptoms -Educated on BP goals and benefits of medications for prevention of heart attack, stroke and kidney damage; -Counseled to monitor BP at home, document, and provide log at future appointments -Recommended to continue current medication Counseled on staying hydrated especially during times of intense physical activity  Update 09/07/21 Reports BP has been controlled, pulse has been "up and down." No specific logs at this time.  He denies any symptoms such as dizziness or HA's. He is now taking his chlorthalidone in the morning and he tolerates well. Encouraged to  maintain hydration and continue medications adherence.  He continues to be very active around his house.  Continue current meds at this time.  Hyperlipidemia: (LDL goal < 70) -Not ideally controlled per 03/2021 labs  -Repatha start 01/2021 -Current treatment: Repatha 140 mg injection every 14 days (01/2021) -Medications previously tried: statin intolerant.  -Reports that everything is taken care of from a cost standpoint through lipid clinic. Will not be responsible for OOP spending due to approval of [employer] grant.  -Educated on Cholesterol goals;  -Recommended to continue current medication  Atrial Fibrillation (Goal: prevent stroke and major bleeding) -Controlled -Current treatment: Rate control: Metoprolol succinate 25 mg once daily Anticoagulation:  Eliquis 5 mg twice daily  -Denies costs concerns with Eliquis, currently about $40 dollars/month. Not wanting to pursue patient assistance at this time -Home BP and HR readings: n/a  -Counseled on increased risk of stroke due to Afib and benefits of anticoagulation for stroke prevention; importance of adherence to anticoagulant exactly as prescribed; -Recommended to continue current medication, again emphasized adequate water intake.  Update 09/07/21 Does mention cost burden with Eliquis at this time.  He has not  previously applied for patient assistance and is interested at this time in an application.  I discussed the OOP spend with him and the requirements for approval.  Will have application sent to his house for completion. Denies abnormal bleeding/bruising.  He is requesting a new hematologist so will reach out to PCP for referral.  Continue current meds  Patient Goals/Self-Care Activities Patient will:  - take medications as prescribed  Follow Up Plan: [facility] telephone f/u 4-6 months Medication Assistance: None required.  Patient affirms current coverage meets needs.  Patient's preferred pharmacy is:  [facility] [address], [place] - [address] [place] [place] [address] Phone: [phone] Fax: [phone]  Follow Up:  Patient agrees to Care Plan and Follow-up.         Future Appointments  Date Time Provider [place]  10/12/2021  1:45 PM [facility] HEALTH COACH [facility]  11/12/2021  9:45 AM Constance Haw, MD [facility]  02/15/2022 10:30 AM Allwardt, [name], PA-C [facility]  02/18/2022  2:30 PM [facility] LAB [facility] None  02/18/2022  3:00 PM Orson Slick, MD [facility] None  08/12/2022  2:00 PM Garvin Fila, MD [facility] None   Beverly Milch, PharmD Clinical Pharmacist [phone]

## 2021-09-07 ENCOUNTER — Other Ambulatory Visit: Payer: Self-pay

## 2021-09-07 ENCOUNTER — Ambulatory Visit (INDEPENDENT_AMBULATORY_CARE_PROVIDER_SITE_OTHER): Payer: Medicare HMO | Admitting: Pharmacist

## 2021-09-07 DIAGNOSIS — I10 Essential (primary) hypertension: Secondary | ICD-10-CM

## 2021-09-07 DIAGNOSIS — I4819 Other persistent atrial fibrillation: Secondary | ICD-10-CM

## 2021-09-07 NOTE — Patient Instructions (Addendum)
Visit Information   Goals Addressed             This Visit's Progress    Track and Manage Heart Rate and Rhythm-Atrial Fibrillation       Timeframe:  Long-Range Goal Priority:  High Start Date:  09/07/2021                           Expected End Date:  03/08/2021                     Follow Up Date 12/08/21    - check pulse (heart) rate once a day - make a plan to exercise regularly - make a plan to eat healthy - take medicine as prescribed    Why is this important?   Atrial fibrillation may have no symptoms. Sometimes the symptoms get worse or happen more often.  It is important to keep track of what your symptoms are and when they happen.  A change in symptoms is important to discuss with your doctor or nurse.  Being active and healthy eating will also help you manage your heart condition.     Notes:        Patient Care Plan: CCM Pharmacy Care Plan     Problem Identified: HLD HTN AFib   Priority: High     Long-Range Goal: Patient-Specific Goal   Start Date: 03/04/2021  Expected End Date: 03/04/2022  Recent Progress: On track  Priority: High  Note:   Current Barriers:  Unable to maintain control of HLD - now on [place]):  Patient will verbalize ability to afford treatment regimen contact provider office for questions/concerns as evidenced notation of same in electronic health record through collaboration with PharmD and provider.   Interventions: 1:1 collaboration with Allwardt, [name], PA-C regarding development and update of comprehensive plan of care as evidenced by provider attestation and co-signature Inter-disciplinary care team collaboration (see longitudinal plan of care) Comprehensive medication review performed; medication list updated in electronic medical record No medication changes  Hypertension (BP goal <140/90) -Controlled -Current treatment: Amlodipine 10 mg daily Chlorthalidone 25 mg daily  -Denies issues  with potassium tolerability, able to take 20 meq tablets 3x/day without a problem.   -Current home readings: when symptomatic  -Current dietary habits: no specific diet. 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 Admits to not doing the best with hydradtion. -Denies hypotensive/hypertensive symptoms -Educated on BP goals and benefits of medications for prevention of heart attack, stroke and kidney damage; -Counseled to monitor BP at home, document, and provide log at future appointments -Recommended to continue current medication Counseled on staying hydrated especially during times of intense physical activity  Update 09/07/21 Reports BP has been controlled, pulse has been "up and down." No specific logs at this time.  He denies any symptoms such as dizziness or HA's. He is now taking his chlorthalidone in the morning and he tolerates well. Encouraged to maintain hydration and continue medications adherence.  He continues to be very active around his house.  Continue current meds at this time.  Hyperlipidemia: (LDL goal < 70) -Not ideally controlled per 03/2021 labs  -Repatha start 01/2021 -Current treatment: Repatha 140 mg injection every 14 days (01/2021) -Medications previously tried: statin intolerant.  -Reports that everything is taken care of from a cost standpoint through lipid clinic. Will not be responsible for OOP spending due to approval  of [employer] grant.  -Educated on Cholesterol goals;  -Recommended to continue current medication  Atrial Fibrillation (Goal: prevent stroke and major bleeding) -Controlled -Current treatment: Rate control: Metoprolol succinate 25 mg once daily Anticoagulation:  Eliquis 5 mg twice daily  -Denies costs concerns with Eliquis, currently about $40 dollars/month. Not wanting to pursue patient assistance at this time -Home BP and HR readings: n/a  -Counseled on increased risk of stroke due to  Afib and benefits of anticoagulation for stroke prevention; importance of adherence to anticoagulant exactly as prescribed; -Recommended to continue current medication, again emphasized adequate water intake.  Update 09/07/21 Does mention cost burden with Eliquis at this time.  He has not previously applied for patient assistance and is interested at this time in an application.  I discussed the OOP spend with him and the requirements for approval.  Will have application sent to his house for completion. Denies abnormal bleeding/bruising.  He is requesting a new hematologist so will reach out to PCP for referral.  Continue current meds  Patient Goals/Self-Care Activities Patient will:  - take medications as prescribed  Follow Up Plan: [facility] telephone f/u 4-6 months Medication Assistance: None required.  Patient affirms current coverage meets needs.  Patient's preferred pharmacy is:  [facility] [address], [place] - [address] [place] [place] [address] Phone: [phone] Fax: [phone]  Follow Up:  Patient agrees to Care Plan and Follow-up.         Patient verbalizes understanding of instructions provided today and agrees to view in [place].  Telephone follow up appointment with pharmacy team member scheduled for: 6 months  Edythe Clarity, [place]

## 2021-09-11 ENCOUNTER — Telehealth: Payer: Self-pay | Admitting: Pharmacist

## 2021-09-11 NOTE — Progress Notes (Signed)
    Chronic Care Management Pharmacy Assistant   Name: Patrick Roth  MRN: [id number] DOB: 03-Aug-1946  Reason for Encounter: PAP for Eliquis  Patients PAP for Eliquis mailed to patient to be completed.   [name], [place] Pharmacist Assistant  [phone]  Time Spent: 10 minutes

## 2021-09-13 ENCOUNTER — Other Ambulatory Visit: Payer: Self-pay

## 2021-09-13 ENCOUNTER — Emergency Department (HOSPITAL_BASED_OUTPATIENT_CLINIC_OR_DEPARTMENT_OTHER): Payer: Medicare HMO

## 2021-09-13 ENCOUNTER — Emergency Department (HOSPITAL_BASED_OUTPATIENT_CLINIC_OR_DEPARTMENT_OTHER)
Admission: EM | Admit: 2021-09-13 | Discharge: 2021-09-13 | Disposition: A | Discharge status: Home / Self Care | Payer: Medicare HMO | Attending: Emergency Medicine | Admitting: Emergency Medicine

## 2021-09-13 ENCOUNTER — Encounter (HOSPITAL_BASED_OUTPATIENT_CLINIC_OR_DEPARTMENT_OTHER): Payer: Self-pay | Admitting: Emergency Medicine

## 2021-09-13 DIAGNOSIS — Z20822 Contact with and (suspected) exposure to covid-19: Secondary | ICD-10-CM | POA: Insufficient documentation

## 2021-09-13 DIAGNOSIS — U071 COVID-19: Secondary | ICD-10-CM | POA: Diagnosis not present

## 2021-09-13 DIAGNOSIS — I10 Essential (primary) hypertension: Secondary | ICD-10-CM | POA: Diagnosis not present

## 2021-09-13 DIAGNOSIS — R059 Cough, unspecified: Secondary | ICD-10-CM | POA: Diagnosis not present

## 2021-09-13 DIAGNOSIS — J069 Acute upper respiratory infection, unspecified: Secondary | ICD-10-CM | POA: Diagnosis not present

## 2021-09-13 DIAGNOSIS — R6883 Chills (without fever): Secondary | ICD-10-CM | POA: Diagnosis not present

## 2021-09-13 LAB — RESP PANEL BY RT-PCR (FLU A&B, COVID) ARPGX2
Influenza A by PCR: NEGATIVE
Influenza B by PCR: NEGATIVE
SARS Coronavirus 2 by RT PCR: POSITIVE — AB

## 2021-09-13 NOTE — Discharge Instructions (Signed)
Chest x-ray showed no evidence of infection.  Overall suspect you have a virus.  Recommend Tylenol 1000 mg every 6 hours as needed for chills and body aches.  Stay hydrated.  Please return if symptoms worsen.  Suspect that you will feel better in the next several days.

## 2021-09-13 NOTE — ED Triage Notes (Signed)
Pt reports dry throat and chills in his arms for 3 days.denies fever or cough. Feels like he getting the flu.

## 2021-09-13 NOTE — ED Provider Notes (Signed)
[facility] EMERGENCY DEPT Provider Note   CSN: [id number] Arrival date & time: 09/13/21  1941     History Chief Complaint  Patient presents with   Chills    Patrick Roth is a 75 y.o. male.  The history is provided by the patient.  URI Presenting symptoms: congestion   Presenting symptoms: no cough, no ear pain, no fever and no sore throat   Congestion:    Location:  Nasal Severity:  Mild Onset quality:  Gradual Duration:  4 days Timing:  Constant Progression:  Unchanged Chronicity:  New Relieved by:  Nothing Worsened by:  Nothing Associated symptoms: arthralgias and myalgias   Risk factors: no sick contacts       Past Medical History:  Diagnosis Date   Atrial flutter ([place])    Colon polyps    Constipation 05/27/2015   DOE (dyspnea on exertion) 10/13/2015   Faintness 06/20/2016   History of chicken pox    Hyperlipemia 02/13/2016   Hyperlipidemia    Hypertension    Hypokalemia 06/28/2016   Hyponatremia 06/28/2016   Internal hemorrhoids    Irregular heartbeat    Malignant hypertension 07/15/2015   Sciatica    Stroke ([place]) 2008   Syncope and collapse 06/28/2016    Patient Active Problem List   Diagnosis Date Noted   Paroxysmal atrial fibrillation ([place]) 05/26/2021   Polycythemia 05/26/2021   Statin myopathy 01/29/2021   Acute arterial ischemic stroke, multifocal, anterior circulation, left ([place]) 01/16/2021   Secondary hypercoagulable state ([place]) 09/11/2020   Cyst of perineum in male 11/28/2018   Mixed hyperlipidemia    History of chicken pox    Colon polyps    Atrial flutter with rapid ventricular response ([place])    Other intervertebral disc degeneration, lumbar region 05/09/2017   Lumbar radiculopathy 03/29/2017   Persistent atrial fibrillation ([place]) 09/14/2016   Diuretic-induced hypokalemia 06/28/2016   Essential hypertension 05/27/2015   Mallet deformity of fourth finger, right 07/11/2013   Stroke ([place]) 08/23/2012    Past Surgical  History:  Procedure Laterality Date   CARDIOVERSION N/A 09/02/2017   Procedure: CARDIOVERSION;  Surgeon: [name], MD;  Location: [facility] ENDOSCOPY;  Service: Cardiovascular;  Laterality: N/A;   COLONOSCOPY  2011   great toe surgery     PROSTATE BIOPSY         Family History  Problem Relation Age of Onset   Hypertension Father 7       Deceased   Heart disease Father    Lung cancer Mother 8       Deceased   Healthy Sister        x3   Healthy Brother        x4   Heart disease Brother        #5   Other Daughter        Alpha Thalassemia   Lupus Daughter        #2-deceased   Diabetes Neg Hx    Heart attack Neg Hx    Hyperlipidemia Neg Hx    Sudden death Neg Hx     Social History   Tobacco Use   Smoking status: Never   Smokeless tobacco: Never  Vaping Use   Vaping Use: Never used  Substance Use Topics   Alcohol use: No   Drug use: No    Home Medications Prior to Admission medications   Medication Sig Start Date End Date Taking? Authorizing Provider  apixaban (ELIQUIS) 5 MG TABS tablet Take 1 tablet (  5 mg total) by mouth 2 (two) times daily. 06/16/21  Yes Camnitz, Ocie Doyne, MD  acetaminophen (TYLENOL) 650 MG CR tablet Take 650 mg by mouth every 8 (eight) hours as needed for pain.    [provider]  amLODipine (NORVASC) 10 MG tablet Take 10 mg by mouth daily.    [provider]  chlorthalidone (HYGROTON) 25 MG tablet Take 1 tablet (25 mg total) by mouth daily. 05/19/21   Allwardt, [name], PA-C  Evolocumab (REPATHA SURECLICK) 818 MG/ML SOAJ Inject 1 pen into the skin every 14 (fourteen) days. 04/02/21   Camnitz, Ocie Doyne, MD  metoprolol succinate (TOPROL-XL) 25 MG 24 hr tablet Take 1 tablet (25 mg total) by mouth daily. 08/03/21   Camnitz, Ocie Doyne, MD  oxyCODONE-acetaminophen (PERCOCET/ROXICET) 5-325 MG tablet Take 1 tablet by mouth every 4 (four) hours as needed for severe pain. 01/24/20   Malvin Johns, MD  potassium chloride SA  (KLOR-CON) 20 MEQ tablet TAKE 1 TABLET BY MOUTH IN THE MORNING AND 2 IN THE EVENING 05/19/21   Allwardt, [name], PA-C    Allergies    Crestor [rosuvastatin calcium], Spironolactone, Terazosin, Amiodarone, Atenolol, Bystolic [nebivolol hcl], Hydralazine, Tizanidine, Amlodipine, Clonidine, Hydrocodone-acetaminophen, Levofloxacin, Losartan, Maxzide [hydrochlorothiazide w-triamterene], Olmesartan, Tape, and Tussionex pennkinetic er [hydrocod polst-cpm polst er]  Review of Systems   Review of Systems  Constitutional:  Negative for chills and fever.  HENT:  Positive for congestion. Negative for ear pain and sore throat.   Eyes:  Negative for pain and visual disturbance.  Respiratory:  Negative for cough and shortness of breath.   Cardiovascular:  Negative for chest pain and palpitations.  Gastrointestinal:  Negative for abdominal pain and vomiting.  Genitourinary:  Negative for dysuria and hematuria.  Musculoskeletal:  Positive for arthralgias and myalgias. Negative for back pain.  Skin:  Negative for color change and rash.  Neurological:  Negative for seizures and syncope.  All other systems reviewed and are negative.  Physical Exam Updated Vital Signs BP (!) 139/91   Pulse 70   Temp 98.9 F (37.2 C) (Oral)   Resp 20   SpO2 100%   Physical Exam Vitals and nursing note reviewed.  Constitutional:      General: He is not in acute distress.    Appearance: He is well-developed. He is not ill-appearing.  HENT:     Head: Normocephalic and atraumatic.     Nose: Nose normal.     Mouth/Throat:     Mouth: Mucous membranes are moist.  Eyes:     Extraocular Movements: Extraocular movements intact.     Conjunctiva/sclera: Conjunctivae normal.     Pupils: Pupils are equal, round, and reactive to light.  Cardiovascular:     Rate and Rhythm: Normal rate and regular rhythm.     Pulses: Normal pulses.     Heart sounds: Normal heart sounds. No murmur heard. Pulmonary:     Effort: Pulmonary  effort is normal. No respiratory distress.     Breath sounds: Normal breath sounds.  Abdominal:     General: Abdomen is flat.     Palpations: Abdomen is soft.     Tenderness: There is no abdominal tenderness.  Musculoskeletal:        General: Normal range of motion.     Cervical back: Neck supple.  Skin:    General: Skin is warm and dry.     Capillary Refill: Capillary refill takes less than 2 seconds.  Neurological:     General: No  focal deficit present.     Mental Status: He is alert.    ED Results / Procedures / Treatments   Labs (all labs ordered are listed, but only abnormal results are displayed) Labs Reviewed  RESP PANEL BY RT-PCR (FLU A&B, COVID) ARPGX2    EKG None  Radiology DG Chest Portable 1 View  Result Date: 09/13/2021 CLINICAL DATA:  Cough, dry cough and chills for 3 days, EXAM: PORTABLE CHEST 1 VIEW COMPARISON:  Comparison made with February 03, 2021. FINDINGS: The heart size and mediastinal contours are within normal limits. Both lungs are clear. The visualized skeletal structures are unremarkable. IMPRESSION: No active disease. Electronically Signed   By: Zetta Bills M.D.   On: 09/13/2021 09:09    Procedures Procedures   Medications Ordered in ED Medications - No data to display  ED Course  I have reviewed the triage vital signs and the nursing notes.  Pertinent labs & imaging results that were available during my care of the patient were reviewed by me and considered in my medical decision making (see chart for details).    MDM Rules/Calculators/A&P                           Patrick Roth is here with body aches, myalgias.  Normal vitals.  No fever.  Chest x-ray no evidence of infection or pneumonia.  Overall symptoms sound viral in nature.  He has no chest pain or shortness of breath.  No abdominal pain.  No nausea or vomiting.  He appears very well.  Symptoms have been ongoing for over 3 days.  Likely would benefit from Tamiflu or other treatments  at this time.  Has been vaccinated against COVID but not flu.  Overall given return precautions and discharged in ED in good condition.  This chart was dictated using voice recognition software.  Despite best efforts to proofread,  errors can occur which can change the documentation meaning.   Final Clinical Impression(s) / ED Diagnoses Final diagnoses:  Upper respiratory tract infection, unspecified type    Rx / DC Orders ED Discharge Orders     None        Lennice Sites, DO 09/13/21 [id number]

## 2021-09-13 NOTE — ED Notes (Signed)
Reviewed dc instructions with patient, patient voiced understanding. Dc with belongings,

## 2021-09-14 ENCOUNTER — Telehealth (HOSPITAL_COMMUNITY): Payer: Self-pay

## 2021-09-17 ENCOUNTER — Telehealth (INDEPENDENT_AMBULATORY_CARE_PROVIDER_SITE_OTHER): Payer: Medicare HMO | Admitting: Physician Assistant

## 2021-09-17 VITALS — BP 124/74

## 2021-09-17 DIAGNOSIS — U071 COVID-19: Secondary | ICD-10-CM | POA: Diagnosis not present

## 2021-09-17 NOTE — Progress Notes (Signed)
Virtual Visit via Telephone Note  I connected with Patrick Roth on 09/17/21 at  8:30 AM EDT by telephone and verified that I am speaking with the correct person using two identifiers.  Location: Patient: home Provider: Therapist, music at [employer]    I discussed the limitations, risks, security and privacy concerns of performing an evaluation and management service by telephone and the availability of in person appointments. I also discussed with the patient that there may be a patient responsible charge related to this service. The patient expressed understanding and agreed to proceed.   History of Present Illness:  Chief complaint: COVID-19 positive Symptom onset: ? 5 days ago Pertinent positives: Sore throat, diarrhea, cough - all seem better today Pertinent negatives: SOB, chest pain, headache, dizziness, weakness Treatments tried: Oxycodone  Vaccine status: Pfizer x 3 Sick exposure: No known sick exposures     Observations/Objective:  Does not have a way to check vitals except temp was "normal" per patient. No signs of distress over the phone.  Assessment and Plan:  Diagnosis confirmed via home antigen test.  Patient is currently having mild symptoms.  We discussed current algorithm recommendations for prescribing outpatient antivirals. Risks versus benefits discussed. Ultimately decided against anti-viral therapy at this time as he is unsure when his symptoms exactly started and he is doing fairly well overall.  Advised self-isolation at home for the next 5 days and then masking around others for at least an additional 5 days.  Treat supportively at this time including sleeping prone, deep breathing exercises, pushing fluids, walking every few hours, vitamins C and D, and Tylenol or ibuprofen as needed.  The patient understands that COVID-19 illness can wax and wane.  Should the symptoms acutely worsen or patient starts to experience sudden shortness of breath,  chest pain, severe weakness, the patient will go straight to the emergency department.  Also advised home pulse oximetry monitoring and for any reading consistently under 92%, should also report to the emergency department.  The patient will continue to keep [place] updated.   Follow Up Instructions:    I discussed the assessment and treatment plan with the patient. The patient was provided an opportunity to ask questions and all were answered. The patient agreed with the plan and demonstrated an understanding of the instructions.   The patient was advised to call back or seek an in-person evaluation if the symptoms worsen or if the condition fails to improve as anticipated.  I provided 11 minutes & 14 secs of non-face-to-face time during this encounter.   Anirudh Baiz M Starnisha Batrez, PA-C

## 2021-09-18 ENCOUNTER — Telehealth (INDEPENDENT_AMBULATORY_CARE_PROVIDER_SITE_OTHER): Payer: Medicare HMO | Admitting: Family

## 2021-09-18 ENCOUNTER — Telehealth: Payer: Self-pay

## 2021-09-18 ENCOUNTER — Encounter: Payer: Self-pay | Admitting: Family

## 2021-09-18 VITALS — Ht 68.0 in | Wt 196.0 lb

## 2021-09-18 DIAGNOSIS — K5903 Drug induced constipation: Secondary | ICD-10-CM

## 2021-09-18 MED ORDER — POLYETHYLENE GLYCOL 3350 17 GM/SCOOP PO POWD: 17.0000 g | Freq: Two times a day (BID) | ORAL | 1 refills | Status: AC

## 2021-09-18 NOTE — Telephone Encounter (Signed)
Patient Name: Patrick Roth Gender: Male DOB: Dec 27, 1945 Age: 76 Y 62 M 13 D Return Phone Number: [phone] (Primary), [phone] (Secondary) Address: City/ State/ Zip: [place]  [address] Client [place] [facility] [place] [facility] [place] [facility] Allwardt, Freight forwarder- PA Contact Type Call Who Is Calling Patient / Member / Family / Caregiver Call Type Triage / Clinical Relationship To Patient Self Return Phone Number [phone] (Primary) Chief Complaint Dizziness Reason for Call Symptomatic / Request for [place] states pt has jaw pain and feels lightheaded. Translation No Nurse Assessment Nurse: Jearld Pies, RN, Lovena Le Date/Time Eilene Ghazi Time): 09/18/2021 9:51:18 AM Confirm and document reason for call. If symptomatic, describe symptoms. ---Caller states pt has jaw pain and feels lightheaded. Symptoms started this morning. Current pain level 2/10. Tylenol taken this morning. Urinating more frequently. Burning with urination. Denies blood in urine, chest pain, SOB, fever, or any other symptoms at this time. Does the patient have any new or worsening symptoms? ---Yes Will a triage be completed? ---Yes Related visit to physician within the last 2 weeks? ---No Does the PT have any chronic conditions? (i.e. diabetes, asthma, this includes High risk factors for pregnancy, etc.) ---Unknown Is this a behavioral health or substance abuse call? ---No Guidelines Guideline Title Affirmed Question Affirmed Notes Nurse Date/Time Eilene Ghazi Time) Toothache Toothache present > 24 hours Jake Bathe 09/18/2021 9:53:37 AM Urination Pain - Male Side (flank) or lower back pain present Jake Bathe 09/18/2021 9:54:37 AM PLEASE NOTE: All timestamps contained within this report are represented as [place] Standard Time. CONFIDENTIALTY NOTICE: This fax transmission is intended  only for the addressee. It contains information that is legally privileged, confidential or otherwise protected from use or disclosure. If you are not the intended recipient, you are strictly prohibited from reviewing, disclosing, copying using or disseminating any of this information or taking any action in reliance on or regarding this information. If you have received this fax in error, please notify us immediately by telephone so that we can arrange for its return to [place]. Phone: [phone], Toll-Free: [phone], Fax: [phone] Page: 2 of 2 Call Id: [id number] [place]. Time Eilene Ghazi Time) Disposition Final User 09/18/2021 9:54:24 AM Call Dentist when Office is Open Jake Bathe 09/18/2021 9:55:27 AM See HCP within 4 Hours (or PCP triage) Yes Jearld Pies, RN, Apolonio Schneiders Disagree/Comply Comply Caller Understands Yes PreDisposition Call Doctor Care Advice Given Per Guideline * Call them during regular office hours. * You need to discuss this with your dentist within the next few days. CALL DENTIST WHEN OFFICE IS OPEN: * You become worse * Fever or facial swelling occurs CALL BACK IF: SEE HCP (OR PCP TRIAGE) WITHIN 4 HOURS: * You become worse CALL BACK IF: Comments User: Malissa Hippo, RN Date/Time (Eastern Time): 09/18/2021 9:58:03 AM Warm transferred to office to schedule an appt.

## 2021-09-18 NOTE — Telephone Encounter (Signed)
Pt is schedule with Colletta [place] today.

## 2021-09-18 NOTE — Assessment & Plan Note (Signed)
Pt reports taking 1 Oxycodone for mouth pain daily. Took an OTC laxative a week ago which "cleaned me out", but has not gone again since. Advised on increased water intake, high fiber diet/supplements, and sending generic Miralax to start bid, then qd.

## 2021-09-18 NOTE — Progress Notes (Signed)
MyChart Video Visit    Virtual Visit via Video Note   This visit type was conducted due to national recommendations for restrictions regarding the COVID-19 Pandemic (e.g. social distancing) in an effort to limit this patient's exposure and mitigate transmission in our community. This patient is at least at moderate risk for complications without adequate follow up. This format is felt to be most appropriate for this patient at this time. Physical exam was limited by quality of the video and audio technology used for the visit. CMA was able to get the patient set up on a video visit.  Patient location: Home. Patient and provider in visit Provider location: Office  I discussed the limitations of evaluation and management by telemedicine and the availability of in person appointments. The patient expressed understanding and agreed to proceed.  Visit Date: 09/18/2021  Today's healthcare provider: Jeanie Sewer, NP     Subjective:    Patient ID: Patrick Roth, male    DOB: March 04, 1946, 75 y.o.   MRN: [id number]  Chief Complaint  Patient presents with   Constipation    Noticed a week ago after taking. Oxycodone prn. He only has painful urination when trying to have a bowel movement.     HPI: Constipation: Patient complains of constipation. Onset was 1 week. No stool. Stool pattern has been 1 per week, hard/balls, no blood Straining with bowel movement:yes New diet:no; New meds: yes - Oxycodone 5mg , 1 pill daily Exercise: no Proper water intake: no - states it makes him urinate all night.    Past Medical History:  Diagnosis Date   Atrial flutter ([place])    Colon polyps    Constipation 05/27/2015   DOE (dyspnea on exertion) 10/13/2015   Faintness 06/20/2016   History of chicken pox    Hyperlipemia 02/13/2016   Hyperlipidemia    Hypertension    Hypokalemia 06/28/2016   Hyponatremia 06/28/2016   Internal hemorrhoids    Irregular heartbeat    Malignant hypertension 07/15/2015    Sciatica    Stroke [facility]) 2008   Syncope and collapse 06/28/2016    Past Surgical History:  Procedure Laterality Date   CARDIOVERSION N/A 09/02/2017   Procedure: CARDIOVERSION;  Surgeon: [name], MD;  Location: [facility] ENDOSCOPY;  Service: Cardiovascular;  Laterality: N/A;   COLONOSCOPY  2011   great toe surgery     PROSTATE BIOPSY      Outpatient Medications Prior to Visit  Medication Sig Dispense Refill   acetaminophen (TYLENOL) 650 MG CR tablet Take 650 mg by mouth every 8 (eight) hours as needed for pain.     amLODipine (NORVASC) 10 MG tablet Take 10 mg by mouth daily.     apixaban (ELIQUIS) 5 MG TABS tablet Take 1 tablet (5 mg total) by mouth 2 (two) times daily. 180 tablet 1   bupivacaine (MARCAINE) 0.25 % injection Inject into the skin.     chlorthalidone (HYGROTON) 25 MG tablet Take 1 tablet (25 mg total) by mouth daily. 90 tablet 1   Evolocumab (REPATHA SURECLICK) 193 MG/ML SOAJ Inject 1 pen into the skin every 14 (fourteen) days. 6 mL 3   metoprolol succinate (TOPROL-XL) 25 MG 24 hr tablet Take 1 tablet (25 mg total) by mouth daily. 90 tablet 2   oxyCODONE-acetaminophen (PERCOCET/ROXICET) 5-325 MG tablet Take 1 tablet by mouth every 4 (four) hours as needed for severe pain. 12 tablet 0   potassium chloride SA (KLOR-CON) 20 MEQ tablet TAKE 1 TABLET BY MOUTH IN  THE MORNING AND 2 IN THE EVENING 270 tablet 1   triamcinolone acetonide (TRIESENCE) 40 MG/ML SUSP Inject into the articular space.     No facility-administered medications prior to visit.    Allergies  Allergen Reactions   Crestor [Rosuvastatin Calcium] Shortness Of Breath    Muscle cramps   Spironolactone Shortness Of Breath   Terazosin Shortness Of Breath, Palpitations and Other (See Comments)    Nerve pain   Amiodarone Other (See Comments)    Dropped pulse real low to 45-50, and made him feel very cold   Atenolol Other (See Comments)    Drowsiness and "flu sxs"   Bystolic [Nebivolol Hcl] Other (See  Comments)    GI issues   Hydralazine Other (See Comments)    Joint swelling   Tizanidine Other (See Comments)    Syncope, Elevated BP/pulse   Amlodipine Other (See Comments)   Clonidine Other (See Comments)   Hydrocodone-Acetaminophen Other (See Comments)   Levofloxacin Other (See Comments)   Losartan Other (See Comments)    Did not work.  Pt states he couldn't take b/c of SE, but can't remember what SE were.   Maxzide [Hydrochlorothiazide W-Triamterene] Other (See Comments)    Does not tolerate potassium-sparing diuretics   Olmesartan Other (See Comments)   Tape Other (See Comments)    Other reaction(s): Unknown   Tussionex Pennkinetic Er [Hydrocod Polst-Cpm Polst Er] Other (See Comments)    Other reaction(s): Unknown        Objective:    Physical Exam Vitals and nursing note reviewed.  Constitutional:      Appearance: Normal appearance.  HENT:     Head: Normocephalic.  Pulmonary:     Effort: Pulmonary effort is normal. No respiratory distress.  Musculoskeletal:     Cervical back: Normal range of motion.  Skin:    General: Skin is dry.  Neurological:     Mental Status: He is alert and oriented to person, place, and time.  Psychiatric:        Mood and Affect: Mood normal.        Behavior: Behavior normal.    Ht 5\' 8"  (1.727 m)   Wt 195 lb 15.8 oz (88.9 kg)   BMI 29.80 kg/m   Wt Readings from Last 3 Encounters:  09/18/21 195 lb 15.8 oz (88.9 kg)  08/12/21 196 lb (88.9 kg)  06/18/21 202 lb 3.2 oz (91.7 kg)       Assessment & Plan:   Problem List Items Addressed This Visit       Digestive   Constipation due to pain medication - Primary    Pt reports taking 1 Oxycodone for mouth pain daily. Took an OTC laxative a week ago which "cleaned me out", but has not gone again since. Advised on increased water intake, high fiber diet/supplements, and sending generic Miralax to start bid, then qd.       No orders of the defined types were placed in this  encounter.   I discussed the assessment and treatment plan with the patient. The patient was provided an opportunity to ask questions and all were answered. The patient agreed with the plan and demonstrated an understanding of the instructions.   The patient was advised to call back or seek an in-person evaluation if the symptoms worsen or if the condition fails to improve as anticipated.  I provided 23 minutes of face-to-face time during this encounter.   Jeanie Sewer, NP [place] [phone] (phone) [phone] (fax)  [place]

## 2021-09-21 DIAGNOSIS — I4819 Other persistent atrial fibrillation: Secondary | ICD-10-CM

## 2021-09-21 DIAGNOSIS — I10 Essential (primary) hypertension: Secondary | ICD-10-CM

## 2021-09-23 DIAGNOSIS — R109 Unspecified abdominal pain: Secondary | ICD-10-CM | POA: Diagnosis not present

## 2021-09-23 DIAGNOSIS — K5909 Other constipation: Secondary | ICD-10-CM | POA: Diagnosis not present

## 2021-09-30 ENCOUNTER — Emergency Department (HOSPITAL_COMMUNITY): Payer: Medicare HMO

## 2021-09-30 ENCOUNTER — Encounter (HOSPITAL_COMMUNITY): Payer: Self-pay

## 2021-09-30 ENCOUNTER — Emergency Department (HOSPITAL_COMMUNITY)
Admission: EM | Admit: 2021-09-30 | Discharge: 2021-09-30 | Disposition: A | Discharge status: Home / Self Care | Payer: Medicare HMO | Attending: Student | Admitting: Student

## 2021-09-30 DIAGNOSIS — Z20822 Contact with and (suspected) exposure to covid-19: Secondary | ICD-10-CM | POA: Diagnosis not present

## 2021-09-30 DIAGNOSIS — I4891 Unspecified atrial fibrillation: Secondary | ICD-10-CM | POA: Insufficient documentation

## 2021-09-30 DIAGNOSIS — Z8673 Personal history of transient ischemic attack (TIA), and cerebral infarction without residual deficits: Secondary | ICD-10-CM | POA: Insufficient documentation

## 2021-09-30 DIAGNOSIS — R55 Syncope and collapse: Secondary | ICD-10-CM | POA: Insufficient documentation

## 2021-09-30 DIAGNOSIS — R0902 Hypoxemia: Secondary | ICD-10-CM | POA: Diagnosis not present

## 2021-09-30 DIAGNOSIS — I10 Essential (primary) hypertension: Secondary | ICD-10-CM | POA: Diagnosis not present

## 2021-09-30 DIAGNOSIS — Z79899 Other long term (current) drug therapy: Secondary | ICD-10-CM | POA: Diagnosis not present

## 2021-09-30 DIAGNOSIS — I491 Atrial premature depolarization: Secondary | ICD-10-CM | POA: Diagnosis not present

## 2021-09-30 DIAGNOSIS — I4510 Unspecified right bundle-branch block: Secondary | ICD-10-CM | POA: Diagnosis not present

## 2021-09-30 DIAGNOSIS — R5383 Other fatigue: Secondary | ICD-10-CM | POA: Diagnosis present

## 2021-09-30 DIAGNOSIS — U071 COVID-19: Secondary | ICD-10-CM | POA: Diagnosis not present

## 2021-09-30 DIAGNOSIS — Z7901 Long term (current) use of anticoagulants: Secondary | ICD-10-CM | POA: Insufficient documentation

## 2021-09-30 DIAGNOSIS — R531 Weakness: Secondary | ICD-10-CM | POA: Diagnosis not present

## 2021-09-30 LAB — URINALYSIS, ROUTINE W REFLEX MICROSCOPIC
Bacteria, UA: NONE SEEN
Bilirubin Urine: NEGATIVE
Glucose, UA: NEGATIVE mg/dL
Ketones, ur: NEGATIVE mg/dL
Leukocytes,Ua: NEGATIVE
Nitrite: NEGATIVE
Protein, ur: NEGATIVE mg/dL
Specific Gravity, Urine: 1.002 — ABNORMAL LOW (ref 1.005–1.030)
pH: 7 (ref 5.0–8.0)

## 2021-09-30 LAB — CBC WITH DIFFERENTIAL/PLATELET
Abs Immature Granulocytes: 0.06 10*3/uL (ref 0.00–0.07)
Basophils Absolute: 0 10*3/uL (ref 0.0–0.1)
Basophils Relative: 0 %
Eosinophils Absolute: 0 10*3/uL (ref 0.0–0.5)
Eosinophils Relative: 1 %
HCT: 48.5 % (ref 39.0–52.0)
Hemoglobin: 15.4 g/dL (ref 13.0–17.0)
Immature Granulocytes: 1 %
Lymphocytes Relative: 20 %
Lymphs Abs: 1 10*3/uL (ref 0.7–4.0)
MCH: 29.1 pg (ref 26.0–34.0)
MCHC: 31.8 g/dL (ref 30.0–36.0)
MCV: 91.5 fL (ref 80.0–100.0)
Monocytes Absolute: 0.8 10*3/uL (ref 0.1–1.0)
Monocytes Relative: 15 %
Neutro Abs: 3.3 10*3/uL (ref 1.7–7.7)
Neutrophils Relative %: 63 %
Platelets: 239 10*3/uL (ref 150–400)
RBC: 5.3 MIL/uL (ref 4.22–5.81)
RDW: 14.1 % (ref 11.5–15.5)
WBC: 5.2 10*3/uL (ref 4.0–10.5)
nRBC: 0 % (ref 0.0–0.2)

## 2021-09-30 LAB — COMPREHENSIVE METABOLIC PANEL
ALT: 26 U/L (ref 0–44)
AST: 30 U/L (ref 15–41)
Albumin: 3 g/dL — ABNORMAL LOW (ref 3.5–5.0)
Alkaline Phosphatase: 52 U/L (ref 38–126)
Anion gap: 12 (ref 5–15)
BUN: 13 mg/dL (ref 8–23)
CO2: 23 mmol/L (ref 22–32)
Calcium: 8.5 mg/dL — ABNORMAL LOW (ref 8.9–10.3)
Chloride: 103 mmol/L (ref 98–111)
Creatinine, Ser: 1.21 mg/dL (ref 0.61–1.24)
GFR, Estimated: >60 mL/min (ref >60)
Glucose, Bld: 96 mg/dL (ref 70–99)
Potassium: 4.1 mmol/L (ref 3.5–5.1)
Sodium: 138 mmol/L (ref 135–145)
Total Bilirubin: 1.5 mg/dL — ABNORMAL HIGH (ref 0.3–1.2)
Total Protein: 5.9 g/dL — ABNORMAL LOW (ref 6.5–8.1)

## 2021-09-30 LAB — TROPONIN I (HIGH SENSITIVITY)
Troponin I (High Sensitivity): 11 ng/L (ref <18)
Troponin I (High Sensitivity): 16 ng/L (ref <18)

## 2021-09-30 LAB — RESP PANEL BY RT-PCR (FLU A&B, COVID) ARPGX2
Influenza A by PCR: NEGATIVE
Influenza B by PCR: NEGATIVE
SARS Coronavirus 2 by RT PCR: POSITIVE — AB

## 2021-09-30 MED ORDER — LACTATED RINGERS IV BOLUS
1000.0000 mL | Freq: Once | INTRAVENOUS | Status: AC
  Administered 2021-09-30: 1000 mL via INTRAVENOUS

## 2021-09-30 MED ORDER — METOPROLOL TARTRATE 5 MG/5ML IV SOLN
5.0000 mg | Freq: Once | INTRAVENOUS | Status: AC
  Administered 2021-09-30: 5 mg via INTRAVENOUS
  Filled 2021-09-30: qty 5

## 2021-09-30 NOTE — ED Provider Notes (Signed)
[facility] EMERGENCY DEPARTMENT Provider Note   CSN: [id number] Arrival date & time: 09/30/21  1116     History No chief complaint on file.   Patrick Roth is a 75 y.o. male with PMH persistent A. fib on Eliquis and metoprolol, CVA, HTN who presents to the emergency department for evaluation of fatigue and presyncope.  Patient was seen on 09/13/2021 and was diagnosed with COVID-19.  Patient did not take paxlovid or Molnupiravir.  He states that she has been feeling intermittently fatigued, but after using the restroom today, had an episode where he felt like he was going to pass out.  He had blurry vision that has since resolved during this episode which prompted him to call EMS.  He currently denies chest pain, abdominal pain, nausea, vomiting or other systemic symptoms.  Of note, patient also complains of pain at the TMJ joint on the left that is intermittent and when at its worst makes it difficult to eat.  It is not associated with a temporal headache and is currently not happening here in the emergency department.  HPI     Past Medical History:  Diagnosis Date   Atrial flutter ([place])    Colon polyps    Constipation 05/27/2015   DOE (dyspnea on exertion) 10/13/2015   Faintness 06/20/2016   History of chicken pox    Hyperlipemia 02/13/2016   Hyperlipidemia    Hypertension    Hypokalemia 06/28/2016   Hyponatremia 06/28/2016   Internal hemorrhoids    Irregular heartbeat    Malignant hypertension 07/15/2015   Sciatica    Stroke [facility]) 2008   Syncope and collapse 06/28/2016    Patient Active Problem List   Diagnosis Date Noted   Paroxysmal atrial fibrillation ([place]) 05/26/2021   Polycythemia 05/26/2021   Statin myopathy 01/29/2021   Acute arterial ischemic stroke, multifocal, anterior circulation, left ([place]) 01/16/2021   Secondary hypercoagulable state ([place]) 09/11/2020   Cyst of perineum in male 11/28/2018   Mixed hyperlipidemia    History of chicken pox    Colon  polyps    Atrial flutter with rapid ventricular response ([place])    Other intervertebral disc degeneration, lumbar region 05/09/2017   Lumbar radiculopathy 03/29/2017   Persistent atrial fibrillation ([place]) 09/14/2016   Diuretic-induced hypokalemia 06/28/2016   Essential hypertension 05/27/2015   Constipation due to pain medication 05/27/2015   Mallet deformity of fourth finger, right 07/11/2013   Stroke ([place]) 08/23/2012    Past Surgical History:  Procedure Laterality Date   CARDIOVERSION N/A 09/02/2017   Procedure: CARDIOVERSION;  Surgeon: [name], MD;  Location: [facility] ENDOSCOPY;  Service: Cardiovascular;  Laterality: N/A;   COLONOSCOPY  2011   great toe surgery     PROSTATE BIOPSY         Family History  Problem Relation Age of Onset   Hypertension Father 53       Deceased   Heart disease Father    Lung cancer Mother 20       Deceased   Healthy Sister        x3   Healthy Brother        x4   Heart disease Brother        #5   Other Daughter        Alpha Thalassemia   Lupus Daughter        #2-deceased   Diabetes Neg Hx    Heart attack Neg Hx    Hyperlipidemia Neg Hx  Sudden death Neg Hx     Social History   Tobacco Use   Smoking status: Never   Smokeless tobacco: Never  Vaping Use   Vaping Use: Never used  Substance Use Topics   Alcohol use: No   Drug use: No    Home Medications Prior to Admission medications   Medication Sig Start Date End Date Taking? Authorizing Provider  acetaminophen (TYLENOL) 650 MG CR tablet Take 650 mg by mouth every 8 (eight) hours as needed for pain.    [provider]  amLODipine (NORVASC) 10 MG tablet Take 10 mg by mouth daily.    [provider]  apixaban (ELIQUIS) 5 MG TABS tablet Take 1 tablet (5 mg total) by mouth 2 (two) times daily. 06/16/21   Camnitz, Ocie Doyne, MD  bupivacaine (MARCAINE) 0.25 % injection Inject into the skin. 06/16/21   [provider]  chlorthalidone (HYGROTON) 25 MG  tablet Take 1 tablet (25 mg total) by mouth daily. 05/19/21   Allwardt, [name], PA-C  Evolocumab (REPATHA SURECLICK) 272 MG/ML SOAJ Inject 1 pen into the skin every 14 (fourteen) days. 04/02/21   Camnitz, Ocie Doyne, MD  metoprolol succinate (TOPROL-XL) 25 MG 24 hr tablet Take 1 tablet (25 mg total) by mouth daily. 08/03/21   Camnitz, Ocie Doyne, MD  oxyCODONE-acetaminophen (PERCOCET/ROXICET) 5-325 MG tablet Take 1 tablet by mouth every 4 (four) hours as needed for severe pain. 01/24/20   Malvin Johns, MD  polyethylene glycol powder (GLYCOLAX/MIRALAX) 17 GM/SCOOP powder Take 17 g by mouth in the morning and at bedtime. Start with 1 scoop 2 times per day until bowels are moving, then reduce to 1 scoop daily. Drink 64oz of water daily. 09/18/21   Jeanie Sewer, NP  potassium chloride SA (KLOR-CON) 20 MEQ tablet TAKE 1 TABLET BY MOUTH IN THE MORNING AND 2 IN THE EVENING 05/19/21   Allwardt, Alyssa M, PA-C  triamcinolone acetonide (TRIESENCE) 40 MG/ML SUSP Inject into the articular space. 06/16/21   [provider]    Allergies    Crestor [rosuvastatin calcium], Spironolactone, Terazosin, Amiodarone, Atenolol, Bystolic [nebivolol hcl], Hydralazine, Tizanidine, Amlodipine, Clonidine, Hydrocodone-acetaminophen, Levofloxacin, Losartan, Maxzide [hydrochlorothiazide w-triamterene], Olmesartan, Tape, and Tussionex pennkinetic er [hydrocod polst-cpm polst er]  Review of Systems   Review of Systems  Constitutional:  Positive for fatigue. Negative for chills and fever.  HENT:  Negative for ear pain and sore throat.   Eyes:  Negative for pain and visual disturbance.  Respiratory:  Negative for cough and shortness of breath.   Cardiovascular:  Negative for chest pain and palpitations.  Gastrointestinal:  Negative for abdominal pain and vomiting.  Genitourinary:  Negative for dysuria and hematuria.  Musculoskeletal:  Negative for arthralgias and back pain.  Skin:  Negative for color change and  rash.  Neurological:  Negative for seizures and syncope (presyncope).  All other systems reviewed and are negative.  Physical Exam Updated Vital Signs BP (!) 115/91   Pulse 99   Temp 97.8 F (36.6 C) (Oral)   Resp (!) 8   Ht 5\' 8"  (1.727 m)   Wt 86.2 kg   SpO2 99%   BMI 28.89 kg/m   Physical Exam Vitals and nursing note reviewed.  Constitutional:      Appearance: He is well-developed.  HENT:     Head: Normocephalic and atraumatic.  Eyes:     Conjunctiva/sclera: Conjunctivae normal.  Cardiovascular:     Rate and Rhythm: Tachycardia present. Rhythm irregular.     Heart sounds:  No murmur heard. Pulmonary:     Effort: Pulmonary effort is normal. No respiratory distress.     Breath sounds: Normal breath sounds.  Abdominal:     Palpations: Abdomen is soft.     Tenderness: There is no abdominal tenderness.  Musculoskeletal:     Cervical back: Neck supple.  Skin:    General: Skin is warm and dry.  Neurological:     Mental Status: He is alert.    ED Results / Procedures / Treatments   Labs (all labs ordered are listed, but only abnormal results are displayed) Labs Reviewed  RESP PANEL BY RT-PCR (FLU A&B, COVID) ARPGX2 - Abnormal; Notable for the following components:      Result Value   SARS Coronavirus 2 by RT PCR POSITIVE (*)    All other components within normal limits  COMPREHENSIVE METABOLIC PANEL - Abnormal; Notable for the following components:   Calcium 8.5 (*)    Total Protein 5.9 (*)    Albumin 3.0 (*)    Total Bilirubin 1.5 (*)    All other components within normal limits  URINALYSIS, ROUTINE W REFLEX MICROSCOPIC - Abnormal; Notable for the following components:   Color, Urine COLORLESS (*)    Specific Gravity, Urine 1.002 (*)    Hgb urine dipstick SMALL (*)    All other components within normal limits  CBC WITH DIFFERENTIAL/PLATELET  TROPONIN I (HIGH SENSITIVITY)  TROPONIN I (HIGH SENSITIVITY)    EKG EKG Interpretation  Date/Time:  Wednesday  September 30 2021 14:21:06 EST Ventricular Rate:  101 PR Interval:  195 QRS Duration: 150 QT Interval:  403 QTC Calculation: 523 R Axis:   -54 Text Interpretation: Atrial fibrillation RBBB and LAFB Confirmed by Walker Paddack ([id number]) on 09/30/2021 2:27:31 PM  Radiology DG Chest Portable 1 View  Result Date: 09/30/2021 CLINICAL DATA:  75 year old male with history of dyspnea. Weakness. Atrial fibrillation. EXAM: PORTABLE CHEST 1 VIEW COMPARISON:  Chest x-ray 09/13/2021. FINDINGS: Lung volumes are normal. No consolidative airspace disease. No pleural effusions. No pneumothorax. No pulmonary nodule or mass noted. Pulmonary vasculature and the cardiomediastinal silhouette are within normal limits. Atherosclerosis in the thoracic aorta. IMPRESSION: 1.  No radiographic evidence of acute cardiopulmonary disease. 2. Aortic atherosclerosis. Electronically Signed   By: Vinnie Langton M.D.   On: 09/30/2021 12:15    Procedures Procedures   Medications Ordered in ED Medications  lactated ringers bolus 1,000 mL (0 mLs Intravenous Stopped 09/30/21 1234)  metoprolol tartrate (LOPRESSOR) injection 5 mg (5 mg Intravenous Given 09/30/21 1255)  metoprolol tartrate (LOPRESSOR) injection 5 mg (5 mg Intravenous Given 09/30/21 1422)    ED Course  I have reviewed the triage vital signs and the nursing notes.  Pertinent labs & imaging results that were available during my care of the patient were reviewed by me and considered in my medical decision making (see chart for details).    MDM Rules/Calculators/A&P                           Patient seen emergency department for evaluation of presyncope.  Physical exam is unremarkable.  Laboratory evaluation unremarkable including a negative high-sensitivity troponin.  ECG reveals A. fib with a rate between 100-120.  Chest x-ray unremarkable, urinalysis unremarkable.  Patient COVID test positive but he tested +2 weeks ago and this is likely a remnant of this.  Patient  received fluid resuscitation and 2 rounds of Lopressor which improved the patient's heart rate.  I had a long discussion with the patient about his medication regimen for his atrial fibrillation and the patient does not appear to be on rate controlling therapy.  Chart review shows the patient on either amiodarone or metoprolol, but the patient is unfamiliar with either these medications.  I suspect the patient likely had a vasovagal event in the setting of having presyncope after using the bathroom, he was encouraged to follow-up with his primary care physician to discuss appropriate rate control in the setting of atrial fibrillation.  Patient was able to ambulate without difficulty here in the emergency department and did not have persistent presyncope.  Patient then discharged with outpatient cardiology follow-up. Final Clinical Impression(s) / ED Diagnoses Final diagnoses:  Vasovagal episode    Rx / DC Orders ED Discharge Orders     None        Tirso Laws, Debe Coder, MD 09/30/21 [phone]

## 2021-09-30 NOTE — ED Triage Notes (Signed)
Here for Afib/ weakness HR at 102.  Pt alert and oriented, pt from home, history of Afib.

## 2021-10-03 NOTE — Progress Notes (Signed)
Cardiology Office Note Date:  10/03/2021  Patient ID:  Patrick Roth [birth year], MRN [id number] PCP:  [name], PA-C  Cardiologist:  Dr. Acie Fredrickson Electrophysiologist: Dr. [name]   Chief Complaint:   post hospital   History of Present Illness: Patrick Roth is a 75 y.o. male with history of AFib, HTN, CVA,  HLD, syncope attributed to orthostasis in 2017, h/o medication non complinace, another stroke Feb 2022 (compliant with his [place] at the time)  >> polycythemia  At his vist at the [facility] clinic Nov 2021 he was in [place], prior Zio patch noted 100% AFib, the pt intolerant of amiodarone (self stopped making him feel cold and reported slow HRs).  He had also previously declined ablation strategy. He was started on Toprol the visit prior and spontaneously had conversion to SR apparently Pt felt the BB made him feel poorly, and did not feel bad in AF   He saw A. Lynnell Jude, NP 01/29/21, he was post stroke, review of his hospital record noted recommendation to continue Eliquis. AFib was planned for rate control (some notes describe permanent)and 36mo follow up  He went to the ER 02/03/21 w/CC of SOB, generalized body aches and leg pain EKG described as Aflutter 96bpm, no stemi, RBBB, VSS Work-up including laboratory studies, CT of the head, chest x-ray, and EKG.  "have all returned essentially unremarkable", he was discharged from the ER, with no changes to his medicines.   I saw him March 2022 He is doing OK, generally fatigued, naps during the day, feels like he sleeps well at night Says all in all he feels like he is happy with his AFib control/symptoms He does note that his HR tends to either be 50's or 90's but denies to me any particular symptoms with either. When he thinks he is in AFib he feels like has a slight sense of a change in his breathing, though not overtly SOB No palpitations, no CP and no real cardiac awareness He denies symptoms of bradycardia, no dizzy spells, near  syncope or syncope. No bleeding or signs of bleeding He feels like it took a long time to get to here where he feels pretty decent He went to the ER he says because his BP was unusually elevated 140's/110 and went in to get checked worried about having another stroke  Discussed with baseline conduction system disease and Afl rates generally rate controlled, and in d/w the patient who generally reported feels well, no changes were made, AMIO not re-pursued Discussed likely someday will need pacing  F/u with Dr. [name] June 2022, following with heme for his polycythemia, continued [place], no changes were made.  He had an ER visit Sep 13, 2021, fever/chills, URI symptoms, COVID + Another ER visit 09/30/21 for recurrent dizziness, near syncope.  He was suspect to have a vagal episode associate with using the bathroom, though was found in Afib that was fast.  He was given IVF and lopressor with reported improved HR discharged from the ER NOTED there that metoprolol on his med list though pt was unfamiliar with the medicine, ? Not taking Back in the ER yesterday 10/04/21 Woke up as usual walked into the kitchen and started to feel weak, lightheaded and poorly again. This seems to always to some degree associated with some abdominal symptoms (he is struggling with constipation of sorts lately), he sat down and did feel a little better but when standing up again felt poorly. He did not have a  sense of his Afib or racing but something "off" with his heart beat. Yesterday his HR was reported > 200, one of his EKG noted with rapid Afib 236bpm and was cardioverted (2 shocks required His last EKG yesterday was SB 54bpm  Last night he was getting ready for bed and started feeling lightheaded again, He sat down and did feel better, but had an uneasy sense and his wife called EMS EMS tracings SR, PACs, one strip had a couple single PVCs, rates 70's EKG at the ER SR 61bpm VSS He was discharged from the ER after  supplementation of mild low mag/K+ LABS this AM K+ 3.3 Mag 1.6 BUN/Creat 11/1.27 Lactic Acid 1.0 HS Trop17, 18 WBC 5.3 H/H 13/91    AFib Hx Diagnosed 2017  AAD hx Started March 2019 >> seems self stopped sometime in the Fall 2021 2/2 making him feel cold and lowered his pulse Toprol was stopped by the patient making him feel bad, poorly tolerated  Past Medical History:  Diagnosis Date   Atrial flutter ([place])    Colon polyps    Constipation 05/27/2015   DOE (dyspnea on exertion) 10/13/2015   Faintness 06/20/2016   History of chicken pox    Hyperlipemia 02/13/2016   Hyperlipidemia    Hypertension    Hypokalemia 06/28/2016   Hyponatremia 06/28/2016   Internal hemorrhoids    Irregular heartbeat    Malignant hypertension 07/15/2015   Sciatica    Stroke ([place]) 2008   Syncope and collapse 06/28/2016    Past Surgical History:  Procedure Laterality Date   CARDIOVERSION N/A 09/02/2017   Procedure: CARDIOVERSION;  Surgeon: [name], MD;  Location: [facility] ENDOSCOPY;  Service: Cardiovascular;  Laterality: N/A;   COLONOSCOPY  2011   great toe surgery     PROSTATE BIOPSY      Current Outpatient Medications  Medication Sig Dispense Refill   acetaminophen (TYLENOL) 650 MG CR tablet Take 650 mg by mouth every 8 (eight) hours as needed for pain.     amLODipine (NORVASC) 10 MG tablet Take 10 mg by mouth daily.     apixaban (ELIQUIS) 5 MG TABS tablet Take 1 tablet (5 mg total) by mouth 2 (two) times daily. 180 tablet 1   bupivacaine (MARCAINE) 0.25 % injection Inject into the skin.     chlorthalidone (HYGROTON) 25 MG tablet Take 1 tablet (25 mg total) by mouth daily. 90 tablet 1   Evolocumab (REPATHA SURECLICK) 580 MG/ML SOAJ Inject 1 pen into the skin every 14 (fourteen) days. 6 mL 3   metoprolol succinate (TOPROL-XL) 25 MG 24 hr tablet Take 1 tablet (25 mg total) by mouth daily. 90 tablet 2   oxyCODONE-acetaminophen (PERCOCET/ROXICET) 5-325 MG tablet Take 1 tablet by mouth every 4 (four)  hours as needed for severe pain. 12 tablet 0   polyethylene glycol powder (GLYCOLAX/MIRALAX) 17 GM/SCOOP powder Take 17 g by mouth in the morning and at bedtime. Start with 1 scoop 2 times per day until bowels are moving, then reduce to 1 scoop daily. Drink 64oz of water daily. 3350 g 1   potassium chloride SA (KLOR-CON) 20 MEQ tablet TAKE 1 TABLET BY MOUTH IN THE MORNING AND 2 IN THE EVENING 270 tablet 1   triamcinolone acetonide (TRIESENCE) 40 MG/ML SUSP Inject into the articular space.     No current facility-administered medications for this visit.    Allergies:   Crestor [rosuvastatin calcium], Spironolactone, Terazosin, Amiodarone, Atenolol, Bystolic [nebivolol hcl], Hydralazine, Tizanidine, Amlodipine, Clonidine, Hydrocodone-acetaminophen, Levofloxacin,  Losartan, Maxzide [hydrochlorothiazide w-triamterene], Olmesartan, Tape, and Tussionex pennkinetic er [hydrocod polst-cpm polst er]   Social History:  The patient  reports that he has never smoked. He has never used smokeless tobacco. He reports that he does not drink alcohol and does not use drugs.   Family History:  The patient's family history includes Healthy in his brother and sister; Heart disease in his brother and father; Hypertension (age of onset: 75) in his father; Lung cancer (age of onset: 19) in his mother; Lupus in his daughter; Other in his daughter.  ROS:  Please see the history of present illness.  All other systems are reviewed and otherwise negative.   PHYSICAL EXAM:  VS:  There were no vitals taken for this visit. BMI: There is no height or weight on file to calculate BMI. Well nourished, well developed, in no acute distress  HEENT: normocephalic, atraumatic  Neck: no JVD, carotid bruits or masses Cardiac:  RRR,bradycardic; no significant murmurs, no rubs, or gallops Lungs:  CTA b/l, no wheezing, rhonchi or rales  Abd: soft, nontender MS: no deformity or atrophy Ext:  no edema  Skin: warm and dry, no rash Neuro:   No gross deficits appreciated Psych: euthymic mood, full affect   EKG:  ER EKGs are reviewed  Nov 2021 Monitoring Max 215 bpm 02:28pm, 10/28 Min 82 bpm 11:16am, 10/28 Avg 112 bpm 3.3% PVC burden, rare supraventricular ectopy Atrial fibrillation/flutter occurred 100% of the time Longest ventricular run 10 seconds at 161 bpm No symptoms recorded     02/24/2018: stress myoview Nuclear stress EF: 58%. Normal perfusion. No ischemia This is a low risk study.     02/24/2018: TTE Study Conclusions  - Left ventricle: The cavity size was normal. Systolic function was    normal. The estimated ejection fraction was in the range of 55%    to 60%. Wall motion was normal; there were no regional wall    motion abnormalities. There was a reduced contribution of atrial    contraction to ventricular filling, due to increased ventricular    diastolic pressure or atrial contractile dysfunction. The study    is not technically sufficient to allow evaluation of LV diastolic    function. (suspect left atrial mechanical failure, possible    stunning after recent episode of atrial fibrillation versus    chronic atrial dysfunction)  - Left atrium: The atrium was mildly dilated.  - Right ventricle: The cavity size was mildly dilated. Wall    thickness was normal.  - Right atrium: The atrium was mildly dilated.  - Pulmonary arteries: Systolic pressure was moderately increased.    PA peak pressure: 60 mm Hg (S).   6./15/18: 48 hor holter Minimum HR: 37 BPM at 12:18:54 PM Maximum HR: 124 BPM at 7:11:26 PM Average HR: 50 BPM 1% ventricular beats 4% supraventricular beats Ventricular runs of 6 beats Supraventricular runs of 13 beats  Zero atrial fibrillaiton noted on monitor. Average HR 50. No med changes at this time.  Will [name], MD  10/01/16 TTE - Left ventricle: The cavity size was normal. Systolic function was   normal. The estimated ejection fraction was in the range of 55%   to 60%.  Wall motion was normal; there were no regional wall   motion abnormalities. Doppler parameters are consistent with a   reversible restrictive pattern, indicative of decreased left   ventricular diastolic compliance and/or increased left atrial   pressure (grade 3 diastolic dysfunction). Doppler parameters are   consistent  with high ventricular filling pressure. - Aortic valve: Transvalvular velocity was within the normal range.   There was no stenosis. - Mitral valve: Transvalvular velocity was within the normal range.   There was no evidence for stenosis. There was mild regurgitation. - Right ventricle: The cavity size was mildly dilated. Wall   thickness was normal. Systolic function was normal. - Atrial septum: No defect or patent foramen ovale was identified   by color flow Doppler. - Tricuspid valve: There was mild regurgitation. - Pulmonary arteries: Systolic pressure was moderately to severely   increased. PA peak pressure: 57 mm Hg (S). - Pericardium, extracardiac: A trivial pericardial effusion was   identified.  10/10/15: lexiscan stress myoview There was no ST segment deviation noted during stress. No T wave inversion was noted during stress. The study is normal. This is a low risk study.  Low risk stress nuclear study with normal perfusion. Non gated study.   Recent Labs: 01/17/2021: Magnesium 1.9; Magnesium 1.8; TSH 3.801 02/03/2021: B Natriuretic Peptide 161.9 09/30/2021: ALT 26; BUN 13; Creatinine, Ser 1.21; Hemoglobin 15.4; Platelets 239; Potassium 4.1; Sodium 138  01/17/2021: VLDL 11 04/13/2021: Chol/HDL Ratio 2.8; Cholesterol, Total 154; HDL 55; LDL Chol Calc (NIH) 83; Triglycerides 87   Estimated Creatinine Clearance: 55.5 mL/min (by C-G formula based on SCr of 1.21 mg/dL).   Wt Readings from Last 3 Encounters:  09/30/21 190 lb (86.2 kg)  09/18/21 195 lb 15.8 oz (88.9 kg)  08/12/21 196 lb (88.9 kg)     Other studies reviewed: Additional studies/records  reviewed today include: summarized above  ASSESSMENT AND PLAN:  1.  AFib      He is in sinus today has had some SR/SB EKS  over the past year or so, notes by various practitioners have described persistent and permanant      Mentions for rate control strategy ?      CHA2DS2Vasc is 5, on Eliquis, appropriately dosed       He has baseline conduction system disease trifascicular block and SB at baseline  I suspect his symptoms are FVR/RVR since that is what we have seen He does not have overt cardiac awareness with his episodes od dizziness, near syncope Sitting/laying down helps He has noted a correlation with either abdominal bloating/sense of needing to have a BM and or having had a BM He has not had syncope  These symptoms are not necessarily completely new but worse in the last 3-4 weeks ?if provoked by COVID??  Reviewed with Dr. [name] briefly, felt like he had some options We discussed treatment options AAD, Tiosyn 2.   Ablation (PVI) Both of which he states Dr. [name] has previously discussed 3. PPM for tachy/brady with base sinus rates 50's-60's and trifascicular block and RVR  We discussed all 3, today with the patient and his wife, he is not to thrilled about the idea of more medicine, but understands that each option except ablation (perhaps) involves additional medicine.  He seems to probably be leaning towards pacer strategy but wouldl ike to give it some thought In the meantime, I am reluctant about a daily nodal blocker with his conduction disease I have decided to place a Zio AT today to moitor for advanced heart block particularaly given we know he has RVR.  I have given him lopressor 25mg  BID PRN for HR >120/symptoms Discussed NOT to use if HR is not >120 Discussed ER return precautions   2. HTN     BP is good here  today  3.  H/o Medicine non-compliance      He reports good compliance with his medicines       He did have 2 days last week that he was off  Eliquis in prep for an endoscopy that was cancelled 2/2 recent COVID +    Disposition: Should he decided what he would like to do, he will let us know, otherwise back in 1 weeks, sooner if needed, I have asked him not to drive for now    Current medicines are reviewed at length with the patient today.  The patient did not have any concerns regarding medicines.  Haywood Lasso, PA-C 10/03/2021 8:49 AM     [address] [place] [address] [phone] (office)  [phone] (fax)

## 2021-10-04 ENCOUNTER — Emergency Department (HOSPITAL_COMMUNITY): Payer: Medicare HMO

## 2021-10-04 ENCOUNTER — Emergency Department (HOSPITAL_COMMUNITY)
Admission: EM | Admit: 2021-10-04 | Discharge: 2021-10-04 | Disposition: A | Discharge status: Home / Self Care | Payer: Medicare HMO | Attending: Student | Admitting: Student

## 2021-10-04 ENCOUNTER — Other Ambulatory Visit: Payer: Self-pay

## 2021-10-04 ENCOUNTER — Encounter (HOSPITAL_COMMUNITY): Payer: Self-pay | Admitting: Emergency Medicine

## 2021-10-04 DIAGNOSIS — Z79899 Other long term (current) drug therapy: Secondary | ICD-10-CM | POA: Diagnosis not present

## 2021-10-04 DIAGNOSIS — I4891 Unspecified atrial fibrillation: Secondary | ICD-10-CM | POA: Insufficient documentation

## 2021-10-04 DIAGNOSIS — I4892 Unspecified atrial flutter: Secondary | ICD-10-CM | POA: Diagnosis not present

## 2021-10-04 DIAGNOSIS — I10 Essential (primary) hypertension: Secondary | ICD-10-CM | POA: Diagnosis not present

## 2021-10-04 DIAGNOSIS — Z7901 Long term (current) use of anticoagulants: Secondary | ICD-10-CM | POA: Insufficient documentation

## 2021-10-04 DIAGNOSIS — I484 Atypical atrial flutter: Secondary | ICD-10-CM

## 2021-10-04 DIAGNOSIS — R42 Dizziness and giddiness: Secondary | ICD-10-CM | POA: Diagnosis not present

## 2021-10-04 LAB — BASIC METABOLIC PANEL
Anion gap: 10 (ref 5–15)
BUN: 12 mg/dL (ref 8–23)
CO2: 26 mmol/L (ref 22–32)
Calcium: 9.3 mg/dL (ref 8.9–10.3)
Chloride: 103 mmol/L (ref 98–111)
Creatinine, Ser: 1.2 mg/dL (ref 0.61–1.24)
GFR, Estimated: >60 mL/min (ref >60)
Glucose, Bld: 107 mg/dL — ABNORMAL HIGH (ref 70–99)
Potassium: 3.5 mmol/L (ref 3.5–5.1)
Sodium: 139 mmol/L (ref 135–145)

## 2021-10-04 LAB — TROPONIN I (HIGH SENSITIVITY)
Troponin I (High Sensitivity): 12 ng/L (ref <18)
Troponin I (High Sensitivity): 12 ng/L (ref <18)

## 2021-10-04 LAB — TSH: TSH: 2.485 u[IU]/mL (ref 0.350–4.500)

## 2021-10-04 LAB — CBC
HCT: 50.6 % (ref 39.0–52.0)
Hemoglobin: 16.3 g/dL (ref 13.0–17.0)
MCH: 28.8 pg (ref 26.0–34.0)
MCHC: 32.2 g/dL (ref 30.0–36.0)
MCV: 89.6 fL (ref 80.0–100.0)
Platelets: 239 10*3/uL (ref 150–400)
RBC: 5.65 MIL/uL (ref 4.22–5.81)
RDW: 14.1 % (ref 11.5–15.5)
WBC: 5.6 10*3/uL (ref 4.0–10.5)
nRBC: 0 % (ref 0.0–0.2)

## 2021-10-04 MED ORDER — LACTATED RINGERS IV BOLUS
1000.0000 mL | Freq: Once | INTRAVENOUS | Status: AC
  Administered 2021-10-04: 1000 mL via INTRAVENOUS

## 2021-10-04 MED ORDER — ETOMIDATE 2 MG/ML IV SOLN
10.0000 mg | Freq: Once | INTRAVENOUS | Status: AC
  Administered 2021-10-04: 10 mg via INTRAVENOUS

## 2021-10-04 MED ORDER — DILTIAZEM HCL 25 MG/5ML IV SOLN: 10.0000 mg | Freq: Once | INTRAVENOUS | Status: DC

## 2021-10-04 MED ORDER — APIXABAN 5 MG PO TABS
5.0000 mg | ORAL_TABLET | Freq: Two times a day (BID) | ORAL | Status: DC
  Administered 2021-10-04: 5 mg via ORAL
  Filled 2021-10-04: qty 1

## 2021-10-04 NOTE — ED Notes (Signed)
Procedural Sedation  RT- Dimitri PedDe Hollingshead MD- Kommor RN- Rmani Kellogg   1215- 8 Etomidate initiated  1217- 2 Etomidate initiated  1217 1 Shock 1219 2 Shock  Pt remained in Afib post procedure.

## 2021-10-04 NOTE — Progress Notes (Signed)
   Progress Note  Patient Name: Patrick Roth Date of Encounter: 10/04/2021  Primary Cardiologist: Mertie Moores, MD  Case discussed with Dr. Matilde Sprang.  I reviewed his records.  He has been following with Dr. [name] with history of paroxysmal to persistent atrial fibrillation and atrial flutter.  He was last seen in June at which point he was in sinus rhythm with heart rate in the 40s on Toprol-XL.  He has a CHA2DS2-VASc score of 4 and is on Eliquis, denies any recent missed doses per ER discussion with patient.  There is mention in Dr. Macky Lower note regarding use of amiodarone, however it is not clear that he has actually been taking this, and the chart actually lists amiodarone as an allergy (looks to be more of an intolerance related to bradycardia).  Presents now with shortness of breath and feeling of presyncope, noted to be in possible one-to-one conduction atypical atrial flutter with heart rate in the 230s by ECG with repeat tracings showing variably conducted atrial flutter and heart rates generally in the 100-110 range.  I discussed the case with Dr. Rayann Heman and asked him to review the tracings as well.  Recommendation is for the patient to be cardioverted in the ER.  He actually already has a follow-up visit scheduled this week with the EP for further discussion.  I would not further advance AV nodal blockers given his resting bradycardia in sinus rhythm.  Looks to be more of a situation to either consider ablation or antiarrhythmic therapy (question Tikosyn).  Signed, Rozann Lesches, MD  10/04/2021, 11:05 AM

## 2021-10-04 NOTE — ED Notes (Signed)
Wife took pt to front lobby via wheelchari

## 2021-10-04 NOTE — ED Provider Notes (Signed)
[facility] EMERGENCY DEPARTMENT Provider Note   CSN: [id number] Arrival date & time: 10/04/21  0908     History Chief Complaint  Patient presents with   Atrial Fibrillation    Patrick Roth is a 75 y.o. male PMH persistent A. fib on Eliquis and metoprolol, CVA, HTN who presents the emergency department for evaluation of fatigue and rapid heart rate.  Patient states that since being seen by myself in the emergency department on 09/30/2021, he has been feeling well until he woke up this morning.  Upon waking this morning he felt short of breath with concerned that he was going to pass out.  Denies chest pain, abdominal pain, nausea, vomiting, headache, cough, fever or other systemic symptoms.  Patient arrives with a flutter, maximum rate 230 that self resolved by the time I examined the patient.   Atrial Fibrillation Associated symptoms include shortness of breath. Pertinent negatives include no chest pain and no abdominal pain.      Past Medical History:  Diagnosis Date   Atrial flutter ([place])    Colon polyps    Constipation 05/27/2015   DOE (dyspnea on exertion) 10/13/2015   Faintness 06/20/2016   History of chicken pox    Hyperlipemia 02/13/2016   Hyperlipidemia    Hypertension    Hypokalemia 06/28/2016   Hyponatremia 06/28/2016   Internal hemorrhoids    Irregular heartbeat    Malignant hypertension 07/15/2015   Sciatica    Stroke [facility]) 2008   Syncope and collapse 06/28/2016    Patient Active Problem List   Diagnosis Date Noted   Paroxysmal atrial fibrillation ([place]) 05/26/2021   Polycythemia 05/26/2021   Statin myopathy 01/29/2021   Acute arterial ischemic stroke, multifocal, anterior circulation, left ([place]) 01/16/2021   Secondary hypercoagulable state ([place]) 09/11/2020   Cyst of perineum in male 11/28/2018   Mixed hyperlipidemia    History of chicken pox    Colon polyps    Atrial flutter with rapid ventricular response ([place])    Other intervertebral disc  degeneration, lumbar region 05/09/2017   Lumbar radiculopathy 03/29/2017   Persistent atrial fibrillation ([place]) 09/14/2016   Diuretic-induced hypokalemia 06/28/2016   Essential hypertension 05/27/2015   Constipation due to pain medication 05/27/2015   Mallet deformity of fourth finger, right 07/11/2013   Stroke ([place]) 08/23/2012    Past Surgical History:  Procedure Laterality Date   CARDIOVERSION N/A 09/02/2017   Procedure: CARDIOVERSION;  Surgeon: [name], MD;  Location: [facility] ENDOSCOPY;  Service: Cardiovascular;  Laterality: N/A;   COLONOSCOPY  2011   great toe surgery     PROSTATE BIOPSY         Family History  Problem Relation Age of Onset   Hypertension Father 15       Deceased   Heart disease Father    Lung cancer Mother 36       Deceased   Healthy Sister        x3   Healthy Brother        x4   Heart disease Brother        #5   Other Daughter        Alpha Thalassemia   Lupus Daughter        #2-deceased   Diabetes Neg Hx    Heart attack Neg Hx    Hyperlipidemia Neg Hx    Sudden death Neg Hx     Social History   Tobacco Use   Smoking status: Never   Smokeless  tobacco: Never  Vaping Use   Vaping Use: Never used  Substance Use Topics   Alcohol use: No   Drug use: No    Home Medications Prior to Admission medications   Medication Sig Start Date End Date Taking? Authorizing Provider  acetaminophen (TYLENOL) 650 MG CR tablet Take 1,300 mg by mouth every 8 (eight) hours as needed for pain.   Yes [provider]  amLODipine (NORVASC) 10 MG tablet Take 10 mg by mouth daily.   Yes [provider]  apixaban (ELIQUIS) 5 MG TABS tablet Take 1 tablet (5 mg total) by mouth 2 (two) times daily. 06/16/21  Yes Camnitz, Will Hassell Done, MD  chlorthalidone (HYGROTON) 25 MG tablet Take 1 tablet (25 mg total) by mouth daily. 05/19/21  Yes Allwardt, Alyssa M, PA-C  Evolocumab (REPATHA SURECLICK) 416 MG/ML SOAJ Inject 1 pen into the skin every 14  (fourteen) days. 04/02/21  Yes Camnitz, Ocie Doyne, MD  metoprolol succinate (TOPROL-XL) 25 MG 24 hr tablet Take 1 tablet (25 mg total) by mouth daily. 08/03/21  Yes Camnitz, Ocie Doyne, MD  oxyCODONE-acetaminophen (PERCOCET/ROXICET) 5-325 MG tablet Take 1 tablet by mouth every 4 (four) hours as needed for severe pain. 01/24/20  Yes Malvin Johns, MD  polyethylene glycol powder (GLYCOLAX/MIRALAX) 17 GM/SCOOP powder Take 17 g by mouth in the morning and at bedtime. Start with 1 scoop 2 times per day until bowels are moving, then reduce to 1 scoop daily. Drink 64oz of water daily. Patient taking differently: Take 17 g by mouth 2 (two) times daily as needed for moderate constipation. Start with 1 scoop 2 times per day until bowels are moving, then reduce to 1 scoop daily. Drink 64oz of water daily. 09/18/21  Yes Hudnell, Colletta [place], NP  potassium chloride SA (KLOR-CON) 20 MEQ tablet TAKE 1 TABLET BY MOUTH IN THE MORNING AND 2 IN THE EVENING 05/19/21  Yes Allwardt, Alyssa M, PA-C  psyllium (METAMUCIL) 58.6 % powder Take 1 packet by mouth daily as needed (constipation).   Yes [provider]    Allergies    Crestor [rosuvastatin calcium], Spironolactone, Terazosin, Amiodarone, Atenolol, Bystolic [nebivolol hcl], Hydralazine, Tizanidine, Amlodipine, Clonidine, Hydrocodone-acetaminophen, Levofloxacin, Losartan, Maxzide [hydrochlorothiazide w-triamterene], Olmesartan, Tape, and Tussionex pennkinetic er [hydrocod polst-cpm polst er]  Review of Systems   Review of Systems  Constitutional:  Negative for chills and fever.  HENT:  Negative for ear pain and sore throat.   Eyes:  Negative for pain and visual disturbance.  Respiratory:  Positive for shortness of breath. Negative for cough.   Cardiovascular:  Negative for chest pain and palpitations.  Gastrointestinal:  Negative for abdominal pain and vomiting.  Genitourinary:  Negative for dysuria and hematuria.  Musculoskeletal:  Negative for arthralgias  and back pain.  Skin:  Negative for color change and rash.  Neurological:  Positive for syncope (presyncope). Negative for seizures.  All other systems reviewed and are negative.  Physical Exam Updated Vital Signs BP 134/81   Pulse (!) 27   Temp 98.8 F (37.1 C) (Oral)   Resp 19   Ht 5\' 8"  (1.727 m)   Wt 82.1 kg   SpO2 100%   BMI 27.52 kg/m   Physical Exam Vitals and nursing note reviewed.  Constitutional:      Appearance: He is well-developed.  HENT:     Head: Normocephalic and atraumatic.  Eyes:     Conjunctiva/sclera: Conjunctivae normal.  Cardiovascular:     Rate and Rhythm: Tachycardia present. Rhythm irregular.  Heart sounds: No murmur heard. Pulmonary:     Effort: Pulmonary effort is normal. No respiratory distress.     Breath sounds: Normal breath sounds.  Abdominal:     Palpations: Abdomen is soft.     Tenderness: There is no abdominal tenderness.  Musculoskeletal:     Cervical back: Neck supple.  Skin:    General: Skin is warm and dry.  Neurological:     Mental Status: He is alert.    ED Results / Procedures / Treatments   Labs (all labs ordered are listed, but only abnormal results are displayed) Labs Reviewed  BASIC METABOLIC PANEL - Abnormal; Notable for the following components:      Result Value   Glucose, Bld 107 (*)    All other components within normal limits  CBC  TSH  TROPONIN I (HIGH SENSITIVITY)  TROPONIN I (HIGH SENSITIVITY)    EKG None  Radiology DG Chest 2 View  Result Date: 10/04/2021 CLINICAL DATA:  a fib, dizzy EXAM: CHEST - 2 VIEW COMPARISON:  9.  2022 FINDINGS: The cardiomediastinal silhouette is unchanged and enlarged in contour. No pleural effusion. No pneumothorax. No acute pleuroparenchymal abnormality. Visualized abdomen is unremarkable. IMPRESSION: No acute cardiopulmonary abnormality. Electronically Signed   By: Valentino Saxon M.D.   On: 10/04/2021 09:57    Procedures .Sedation  Date/Time: 10/04/2021 2:45  PM Performed by: Teressa Lower, MD Authorized by: Teressa Lower, MD   Consent:    Consent obtained:  Written   Consent given by:  Patient   Risks discussed:  Allergic reaction, prolonged hypoxia resulting in organ damage, prolonged sedation necessitating reversal, dysrhythmia, inadequate sedation, respiratory compromise necessitating ventilatory assistance and intubation, vomiting and nausea Universal protocol:    Immediately prior to procedure, a time out was called: yes   Pre-sedation assessment:    Time since last food or drink:  0800   ASA classification: class 3 - patient with severe systemic disease     Mallampati score:  I - soft palate, uvula, fauces, pillars visible   Pre-sedation assessments completed and reviewed: airway patency, cardiovascular function, mental status, nausea/vomiting and respiratory function   Procedure details (see MAR for exact dosages):    Preoxygenation:  Nasal cannula   Sedation:  Etomidate   Intended level of sedation: deep   Analgesia:  None   Intra-procedure monitoring:  Blood pressure monitoring, continuous capnometry, cardiac monitor and frequent vital sign checks   Intra-procedure events comment:  Myoclonus   Total Provider sedation time (minutes):  25 Post-procedure details:    Recovery: Patient returned to pre-procedure baseline     Procedure completion:  Tolerated well, no immediate complications .Cardioversion  Date/Time: 10/04/2021 2:46 PM Performed by: Teressa Lower, MD Authorized by: Teressa Lower, MD   Consent:    Consent obtained:  Written   Consent given by:  Patient   Risks discussed:  Cutaneous burn, death, induced arrhythmia and pain   Alternatives discussed:  Rate-control medication Pre-procedure details:    Cardioversion basis:  Elective   Rhythm:  Atrial flutter   Electrode placement:  Anterior-posterior Patient sedated: Yes. Refer to sedation procedure documentation for details of sedation.  Attempt one:     Cardioversion mode:  Synchronous   Waveform:  Monophasic   Shock (Joules):  200   Shock outcome:  No change in rhythm Attempt two:    Cardioversion mode:  Synchronous   Waveform:  Monophasic   Shock (Joules):  200   Shock outcome:  Conversion to normal sinus  rhythm Post-procedure details:    Patient status:  Awake   Patient tolerance of procedure:  Tolerated well, no immediate complications   Medications Ordered in ED Medications  apixaban (ELIQUIS) tablet 5 mg (5 mg Oral Given 10/04/21 1034)  lactated ringers bolus 1,000 mL (0 mLs Intravenous Stopped 10/04/21 1154)  etomidate (AMIDATE) injection 10 mg (10 mg Intravenous Given 10/04/21 1224)    ED Course  I have reviewed the triage vital signs and the nursing notes.  Pertinent labs & imaging results that were available during my care of the patient were reviewed by me and considered in my medical decision making (see chart for details).    MDM Rules/Calculators/A&P                           Patient seen the emergency department for evaluation of atrial flutter with rapid ventricular rate.  Initial EKG with significant tachycardia and a rate of 236.  This self resolved upon my evaluation of the patient and his rate was variable between 110 and 140.  Physical exam is unremarkable.  Laboratory evaluation unremarkable.  Chest x-ray unremarkable.  I spoke with cardiology who agrees that the patient is in persistent a flutter and is recommending elective cardioversion in the emergency department.  The patient does have cardiology follow-up tomorrow and if successful will follow up with cardiology.  Patient sedated with etomidate and upon first synchronized shock at 200 J, the patient actually had an increase in his heart rate prompting an additional shock which returned the patient to normal sinus rhythm.  Patient's baseline heart rate is in the 50s and the patient is throwing intermittent PACs creating an irregular rhythm.  I spoke with  cardiology again who believes the patient is safe for discharge with cardiology follow-up tomorrow.  Patient then discharged with cardiology follow-up Final Clinical Impression(s) / ED Diagnoses Final diagnoses:  None    Rx / DC Orders ED Discharge Orders     None        Daijha Leggio, MD 10/04/21 1452

## 2021-10-04 NOTE — ED Triage Notes (Signed)
Pt woke up with SOB and feeling like he was going to pass out.  Denies chest pain and states he can't feel his heart racing.  Hx of Afib.  States he was seen in ED on Wednesday for Afib.

## 2021-10-05 ENCOUNTER — Ambulatory Visit (INDEPENDENT_AMBULATORY_CARE_PROVIDER_SITE_OTHER): Payer: Medicare HMO | Admitting: Physician Assistant

## 2021-10-05 ENCOUNTER — Encounter (HOSPITAL_COMMUNITY): Payer: Self-pay

## 2021-10-05 ENCOUNTER — Encounter: Payer: Self-pay | Admitting: Physician Assistant

## 2021-10-05 ENCOUNTER — Ambulatory Visit (INDEPENDENT_AMBULATORY_CARE_PROVIDER_SITE_OTHER): Payer: Medicare HMO

## 2021-10-05 ENCOUNTER — Emergency Department (HOSPITAL_COMMUNITY)
Admission: EM | Admit: 2021-10-05 | Discharge: 2021-10-05 | Disposition: A | Discharge status: Home / Self Care | Payer: Medicare HMO | Attending: Emergency Medicine | Admitting: Emergency Medicine

## 2021-10-05 ENCOUNTER — Other Ambulatory Visit: Payer: Self-pay

## 2021-10-05 VITALS — BP 118/70 | HR 65 | Ht 68.5 in | Wt 193.6 lb

## 2021-10-05 DIAGNOSIS — I480 Paroxysmal atrial fibrillation: Secondary | ICD-10-CM

## 2021-10-05 DIAGNOSIS — I10 Essential (primary) hypertension: Secondary | ICD-10-CM | POA: Insufficient documentation

## 2021-10-05 DIAGNOSIS — I453 Trifascicular block: Secondary | ICD-10-CM | POA: Diagnosis not present

## 2021-10-05 DIAGNOSIS — Z7901 Long term (current) use of anticoagulants: Secondary | ICD-10-CM | POA: Diagnosis not present

## 2021-10-05 DIAGNOSIS — E876 Hypokalemia: Secondary | ICD-10-CM | POA: Insufficient documentation

## 2021-10-05 DIAGNOSIS — Z79899 Other long term (current) drug therapy: Secondary | ICD-10-CM | POA: Insufficient documentation

## 2021-10-05 DIAGNOSIS — I4892 Unspecified atrial flutter: Secondary | ICD-10-CM | POA: Insufficient documentation

## 2021-10-05 DIAGNOSIS — R42 Dizziness and giddiness: Secondary | ICD-10-CM

## 2021-10-05 DIAGNOSIS — Z8616 Personal history of COVID-19: Secondary | ICD-10-CM | POA: Diagnosis not present

## 2021-10-05 DIAGNOSIS — R55 Syncope and collapse: Secondary | ICD-10-CM

## 2021-10-05 DIAGNOSIS — I499 Cardiac arrhythmia, unspecified: Secondary | ICD-10-CM | POA: Diagnosis not present

## 2021-10-05 DIAGNOSIS — R0902 Hypoxemia: Secondary | ICD-10-CM | POA: Diagnosis not present

## 2021-10-05 LAB — LACTIC ACID, PLASMA: Lactic Acid, Venous: 1.9 mmol/L (ref 0.5–1.9)

## 2021-10-05 LAB — COMPREHENSIVE METABOLIC PANEL
ALT: 19 U/L (ref 0–44)
AST: 21 U/L (ref 15–41)
Albumin: 3.1 g/dL — ABNORMAL LOW (ref 3.5–5.0)
Alkaline Phosphatase: 43 U/L (ref 38–126)
Anion gap: 5 (ref 5–15)
BUN: 11 mg/dL (ref 8–23)
CO2: 28 mmol/L (ref 22–32)
Calcium: 8.2 mg/dL — ABNORMAL LOW (ref 8.9–10.3)
Chloride: 107 mmol/L (ref 98–111)
Creatinine, Ser: 1.27 mg/dL — ABNORMAL HIGH (ref 0.61–1.24)
GFR, Estimated: 59 mL/min — ABNORMAL LOW (ref >60)
Glucose, Bld: 96 mg/dL (ref 70–99)
Potassium: 3.3 mmol/L — ABNORMAL LOW (ref 3.5–5.1)
Sodium: 140 mmol/L (ref 135–145)
Total Bilirubin: 1.4 mg/dL — ABNORMAL HIGH (ref 0.3–1.2)
Total Protein: 5.9 g/dL — ABNORMAL LOW (ref 6.5–8.1)

## 2021-10-05 LAB — CBC WITH DIFFERENTIAL/PLATELET
Abs Immature Granulocytes: 0.03 10*3/uL (ref 0.00–0.07)
Basophils Absolute: 0 10*3/uL (ref 0.0–0.1)
Basophils Relative: 1 %
Eosinophils Absolute: 0.1 10*3/uL (ref 0.0–0.5)
Eosinophils Relative: 2 %
HCT: 42.9 % (ref 39.0–52.0)
Hemoglobin: 13.6 g/dL (ref 13.0–17.0)
Immature Granulocytes: 1 %
Lymphocytes Relative: 21 %
Lymphs Abs: 1.1 10*3/uL (ref 0.7–4.0)
MCH: 29.1 pg (ref 26.0–34.0)
MCHC: 31.7 g/dL (ref 30.0–36.0)
MCV: 91.7 fL (ref 80.0–100.0)
Monocytes Absolute: 0.8 10*3/uL (ref 0.1–1.0)
Monocytes Relative: 14 %
Neutro Abs: 3.3 10*3/uL (ref 1.7–7.7)
Neutrophils Relative %: 61 %
Platelets: 201 10*3/uL (ref 150–400)
RBC: 4.68 MIL/uL (ref 4.22–5.81)
RDW: 14.3 % (ref 11.5–15.5)
WBC: 5.3 10*3/uL (ref 4.0–10.5)
nRBC: 0 % (ref 0.0–0.2)

## 2021-10-05 LAB — MAGNESIUM: Magnesium: 1.6 mg/dL — ABNORMAL LOW (ref 1.7–2.4)

## 2021-10-05 LAB — TROPONIN I (HIGH SENSITIVITY)
Troponin I (High Sensitivity): 17 ng/L (ref <18)
Troponin I (High Sensitivity): 18 ng/L — ABNORMAL HIGH (ref <18)

## 2021-10-05 MED ORDER — POTASSIUM CHLORIDE CRYS ER 20 MEQ PO TBCR
40.0000 meq | EXTENDED_RELEASE_TABLET | Freq: Once | ORAL | Status: AC
  Administered 2021-10-05: 40 meq via ORAL
  Filled 2021-10-05: qty 2

## 2021-10-05 MED ORDER — MAGNESIUM OXIDE 400 MG PO CAPS: 400.0000 mg | ORAL_CAPSULE | Freq: Two times a day (BID) | ORAL | 0 refills | Status: DC

## 2021-10-05 MED ORDER — MAGNESIUM OXIDE -MG SUPPLEMENT 400 (240 MG) MG PO TABS
400.0000 mg | ORAL_TABLET | Freq: Once | ORAL | Status: AC
  Administered 2021-10-05: 400 mg via ORAL
  Filled 2021-10-05: qty 1

## 2021-10-05 MED ORDER — POTASSIUM CHLORIDE CRYS ER 20 MEQ PO TBCR: 40.0000 meq | EXTENDED_RELEASE_TABLET | Freq: Two times a day (BID) | ORAL | 0 refills | Status: DC

## 2021-10-05 MED ORDER — METOPROLOL TARTRATE 25 MG PO TABS: 25.0000 mg | ORAL_TABLET | Freq: Two times a day (BID) | ORAL | 0 refills | Status: DC | PRN

## 2021-10-05 NOTE — ED Triage Notes (Signed)
BIB [facility] from home. Due to light headed, dizziness, near syncope. Seen here this am due to intense jaw pain, dizziness, and light headed, with near syncope. Had a second episode while waiting to be seen placed on monitor hr was in the 200 d with A-fib with RVR and had to be cardioverted. D/c home and through out the day continued to have dizziness, lightheaded, and near syncope episodes. Called EMS due to concerns of having another episode. A-fib  on the monitor with EMS. PIV 18ga Lt AC. Denies any CP or N/V

## 2021-10-05 NOTE — Discharge Instructions (Signed)
Please follow-up with the atrial fibrillation clinic and with your cardiologist.  I believe that she will need to have a cardiac monitor done to see if you are having atrial fibrillation or atrial flutter when you are feeling dizzy.  Return to the emergency department if symptoms are getting worse.

## 2021-10-05 NOTE — Progress Notes (Unsigned)
[id number] ZIO AT from office inventory, applied to patient.

## 2021-10-05 NOTE — ED Provider Notes (Signed)
[facility] EMERGENCY DEPARTMENT Provider Note   CSN: [id number] Arrival date & time: 10/05/21  0041     History Chief Complaint  Patient presents with   Dizziness    Patrick Roth is a 75 y.o. male.  The history is provided by the patient.  Dizziness He has history of hypertension, hyperlipidemia, stroke, paroxysmal atrial flutter/fibrillation anticoagulated on apixaban and comes in because of an episode of dizziness.  He has been having dizziness for a long time.  Episodes typically would occur within 10-20 minutes of having a bowel movement and would start when he stands up.  It generally got better when he sat down or lay down.  He has had 2 ED visits for this in the last several days including one episode where he was found to be in atrial fibrillation with rapid ventricular response and was cardioverted.  He had an episode tonight which was not related to a bowel movement, so he called an ambulance.  Of note, he is not aware of his heartbeat being fast when he goes into atrial fibrillation.  He denies chest pain, heaviness, tightness, pressure.  Denies any dyspnea, nausea, diaphoresis.  Episodes generally last 10-20 minutes.  He feels he is back to his baseline.  He does have an appointment at the atrial fibrillation clinic later today.  Also of note, he has recently tested positive for COVID-19 although he did not follow through with prescription for Paxlovid   Past Medical History:  Diagnosis Date   Atrial flutter ([place])    Colon polyps    Constipation 05/27/2015   DOE (dyspnea on exertion) 10/13/2015   Faintness 06/20/2016   History of chicken pox    Hyperlipemia 02/13/2016   Hyperlipidemia    Hypertension    Hypokalemia 06/28/2016   Hyponatremia 06/28/2016   Internal hemorrhoids    Irregular heartbeat    Malignant hypertension 07/15/2015   Sciatica    Stroke [facility] 2008   Syncope and collapse 06/28/2016    Patient Active Problem List   Diagnosis Date Noted    Paroxysmal atrial fibrillation ([place]) 05/26/2021   Polycythemia 05/26/2021   Statin myopathy 01/29/2021   Acute arterial ischemic stroke, multifocal, anterior circulation, left ([place]) 01/16/2021   Secondary hypercoagulable state ([place]) 09/11/2020   Cyst of perineum in male 11/28/2018   Mixed hyperlipidemia    History of chicken pox    Colon polyps    Atrial flutter with rapid ventricular response ([place])    Other intervertebral disc degeneration, lumbar region 05/09/2017   Lumbar radiculopathy 03/29/2017   Persistent atrial fibrillation ([place]) 09/14/2016   Diuretic-induced hypokalemia 06/28/2016   Essential hypertension 05/27/2015   Constipation due to pain medication 05/27/2015   Mallet deformity of fourth finger, right 07/11/2013   Stroke ([place]) 08/23/2012    Past Surgical History:  Procedure Laterality Date   CARDIOVERSION N/A 09/02/2017   Procedure: CARDIOVERSION;  Surgeon: [name], MD;  Location: [facility] ENDOSCOPY;  Service: Cardiovascular;  Laterality: N/A;   COLONOSCOPY  2011   great toe surgery     PROSTATE BIOPSY         Family History  Problem Relation Age of Onset   Hypertension Father 65       Deceased   Heart disease Father    Lung cancer Mother 34       Deceased   Healthy Sister        x3   Healthy Brother        x4  Heart disease Brother        #5   Other Daughter        Alpha Thalassemia   Lupus Daughter        #2-deceased   Diabetes Neg Hx    Heart attack Neg Hx    Hyperlipidemia Neg Hx    Sudden death Neg Hx     Social History   Tobacco Use   Smoking status: Never   Smokeless tobacco: Never  Vaping Use   Vaping Use: Never used  Substance Use Topics   Alcohol use: No   Drug use: No    Home Medications Prior to Admission medications   Medication Sig Start Date End Date Taking? Authorizing Provider  acetaminophen (TYLENOL) 650 MG CR tablet Take 1,300 mg by mouth every 8 (eight) hours as needed for pain.    [provider]   amLODipine (NORVASC) 10 MG tablet Take 10 mg by mouth daily.    [provider]  apixaban (ELIQUIS) 5 MG TABS tablet Take 1 tablet (5 mg total) by mouth 2 (two) times daily. 06/16/21   Camnitz, Ocie Doyne, MD  chlorthalidone (HYGROTON) 25 MG tablet Take 1 tablet (25 mg total) by mouth daily. 05/19/21   Allwardt, [name], PA-C  Evolocumab (REPATHA SURECLICK) 024 MG/ML SOAJ Inject 1 pen into the skin every 14 (fourteen) days. 04/02/21   Camnitz, Ocie Doyne, MD  metoprolol succinate (TOPROL-XL) 25 MG 24 hr tablet Take 1 tablet (25 mg total) by mouth daily. 08/03/21   Camnitz, Ocie Doyne, MD  oxyCODONE-acetaminophen (PERCOCET/ROXICET) 5-325 MG tablet Take 1 tablet by mouth every 4 (four) hours as needed for severe pain. 01/24/20   Malvin Johns, MD  polyethylene glycol powder (GLYCOLAX/MIRALAX) 17 GM/SCOOP powder Take 17 g by mouth in the morning and at bedtime. Start with 1 scoop 2 times per day until bowels are moving, then reduce to 1 scoop daily. Drink 64oz of water daily. Patient taking differently: Take 17 g by mouth 2 (two) times daily as needed for moderate constipation. Start with 1 scoop 2 times per day until bowels are moving, then reduce to 1 scoop daily. Drink 64oz of water daily. 09/18/21   Jeanie Sewer, NP  potassium chloride SA (KLOR-CON) 20 MEQ tablet TAKE 1 TABLET BY MOUTH IN THE MORNING AND 2 IN THE EVENING 05/19/21   Allwardt, Alyssa M, PA-C  psyllium (METAMUCIL) 58.6 % powder Take 1 packet by mouth daily as needed (constipation).    [provider]    Allergies    Crestor [rosuvastatin calcium], Spironolactone, Terazosin, Amiodarone, Atenolol, Bystolic [nebivolol hcl], Hydralazine, Tizanidine, Amlodipine, Clonidine, Hydrocodone-acetaminophen, Levofloxacin, Losartan, Maxzide [hydrochlorothiazide w-triamterene], Olmesartan, Tape, and Tussionex pennkinetic er [hydrocod polst-cpm polst er]  Review of Systems   Review of Systems  Neurological:  Positive for  dizziness.  All other systems reviewed and are negative.  Physical Exam Updated Vital Signs BP 121/81 (BP Location: Right Arm)   Pulse 66   Temp 98.3 F (36.8 C) (Oral)   Resp 19   Ht 5\' 8"  (1.727 m)   Wt 82.1 kg   SpO2 99%   BMI 27.52 kg/m   Physical Exam Vitals and nursing note reviewed.  75 year old male, resting comfortably and in no acute distress. Vital signs are normal. Oxygen saturation is 99%, which is normal. Head is normocephalic and atraumatic. PERRLA, EOMI. Oropharynx is clear. Neck is nontender and supple without adenopathy or JVD.  There are no carotid bruits. Back is nontender and  there is no CVA tenderness. Lungs are clear without rales, wheezes, or rhonchi. Chest is nontender. Heart has regular rate and rhythm without murmur. Abdomen is soft, flat, nontender. Extremities have no cyanosis or edema, full range of motion is present. Skin is warm and dry without rash. Neurologic: Mental status is normal, cranial nerves are intact, moves all extremities equally.  ED Results / Procedures / Treatments   Labs (all labs ordered are listed, but only abnormal results are displayed) Labs Reviewed  COMPREHENSIVE METABOLIC PANEL - Abnormal; Notable for the following components:      Result Value   Potassium 3.3 (*)    Creatinine, Ser 1.27 (*)    Calcium 8.2 (*)    Total Protein 5.9 (*)    Albumin 3.1 (*)    Total Bilirubin 1.4 (*)    GFR, Estimated 59 (*)    All other components within normal limits  MAGNESIUM - Abnormal; Notable for the following components:   Magnesium 1.6 (*)    All other components within normal limits  TROPONIN I (HIGH SENSITIVITY) - Abnormal; Notable for the following components:   Troponin I (High Sensitivity) 18 (*)    All other components within normal limits  CBC WITH DIFFERENTIAL/PLATELET  LACTIC ACID, PLASMA  TROPONIN I (HIGH SENSITIVITY)    EKG EKG Interpretation  Date/Time:  Monday October 05 2021 00:44:13 EST Ventricular  Rate:  61 PR Interval:  217 QRS Duration: 150 QT Interval:  475 QTC Calculation: 479 R Axis:   -54 Text Interpretation: Sinus rhythm Borderline prolonged PR interval Right bundle branch block Inferior infarct, old When compared with ECG of 10/04/2021, No significant change was found Confirmed by Delora Fuel ([id number]) on 10/05/2021 12:46:38 AM  Procedures Procedures   Medications Ordered in ED Medications - No data to display  ED Course  I have reviewed the triage vital signs and the nursing notes.  Pertinent lab results that were available during my care of the patient were reviewed by me and considered in my medical decision making (see chart for details).   MDM Rules/Calculators/A&P                         Episodes of dizziness which sound orthostatic in nature, but one documented episode of atrial flutter with rapid ventricular response which required cardioversion.  Old records reviewed confirming to recent ED visits for similar complaints including 1 episode which required cardioversion.  Also, COVID PCR positive on 09/13/2021 and 09/30/2021.  He is currently in sinus rhythm.  Will check orthostatic vital signs and screening labs.  I suspect that he will need to have a cardiac event monitor to see if he is having episodes of paroxysmal atrial fibrillation or atrial flutter.  Labs show mild hypokalemia and also mild hypomagnesemia.  He is given oral potassium and magnesium.  Remainder of labs are unremarkable including troponin x2 and lactic acid level.  He is discharged with prescriptions for K-Dur and magnesium oxide and is referred back to his cardiologist for consideration for outpatient cardiac event monitoring.  Final Clinical Impression(s) / ED Diagnoses Final diagnoses:  Dizziness  Hypokalemia  Hypomagnesemia  Paroxysmal atrial flutter (HCC)  Chronic anticoagulation    Rx / DC Orders ED Discharge Orders          Ordered    potassium chloride SA (KLOR-CON) 20 MEQ tablet   2 times daily        10/05/21 0555    Magnesium Oxide  400 MG CAPS  2 times daily        10/05/21 3014             Delora Fuel, MD 15/97/33 0600

## 2021-10-05 NOTE — ED Notes (Signed)
Provider at bedside

## 2021-10-05 NOTE — Patient Instructions (Addendum)
Medication Instructions:   START TAKING METOPROLOL 25 MG AS NEEDED:  TWICE A DAY FOR HEARTRATE HIGHER THAN 120 BPM ( beats per minute)   *If you need a refill on your cardiac medications before your next appointment, please call your pharmacy*   Lab Work: NONE ORDERED  TODAY   If you have labs (blood work) drawn today and your tests are completely normal, you will receive your results only by: [place] (if you have MyChart) OR A paper copy in the mail If you have any lab test that is abnormal or we need to change your treatment, we will call you to review the results.   Testing/Procedures: Your physician has recommended that you wear an event monitor. Event monitors are medical devices that record the heart's electrical activity. Doctors most often [place] these monitors to diagnose arrhythmias. Arrhythmias are problems with the speed or rhythm of the heartbeat. The monitor is a small, portable device. You can wear one while you do your normal daily activities. This is usually used to diagnose what is causing palpitations/syncope (passing out).     Follow-Up: At [facility], you and your health needs are our priority.  As part of our continuing mission to provide you with exceptional heart care, we have created designated Provider Care Teams.  These Care Teams include your primary Cardiologist (physician) and Advanced Practice Providers (APPs -  Physician Assistants and Nurse Practitioners) who all work together to provide you with the care you need, when you need it.  We recommend signing up for the patient portal called "MyChart".  Sign up information is provided on this After Visit Summary.  MyChart is used to connect with patients for Virtual Visits (Telemedicine).  Patients are able to view lab/test results, encounter notes, upcoming appointments, etc.  Non-urgent messages can be sent to your provider as well.   To learn more about what you can do with MyChart, go to  [email or web address].    Your next appointment:  1 week(s)  The format for your next appointment:   In Person  Provider:   You may see [name], MD or one of the following Advanced Practice Providers on your designated Care Team:   Tommye Standard, [place] "Jonni Sanger" Chalmers Cater, PA-C{   or with [facility] ( [facility] if no available slot [place]/ [place])  Other Instructions

## 2021-10-06 DIAGNOSIS — R55 Syncope and collapse: Secondary | ICD-10-CM | POA: Diagnosis not present

## 2021-10-09 DIAGNOSIS — N1831 Chronic kidney disease, stage 3a: Secondary | ICD-10-CM | POA: Diagnosis not present

## 2021-10-09 DIAGNOSIS — E785 Hyperlipidemia, unspecified: Secondary | ICD-10-CM | POA: Diagnosis not present

## 2021-10-09 DIAGNOSIS — I129 Hypertensive chronic kidney disease with stage 1 through stage 4 chronic kidney disease, or unspecified chronic kidney disease: Secondary | ICD-10-CM | POA: Diagnosis not present

## 2021-10-09 DIAGNOSIS — I4891 Unspecified atrial fibrillation: Secondary | ICD-10-CM | POA: Diagnosis not present

## 2021-10-09 DIAGNOSIS — D869 Sarcoidosis, unspecified: Secondary | ICD-10-CM | POA: Diagnosis not present

## 2021-10-12 ENCOUNTER — Other Ambulatory Visit: Payer: Self-pay

## 2021-10-12 ENCOUNTER — Ambulatory Visit (INDEPENDENT_AMBULATORY_CARE_PROVIDER_SITE_OTHER): Payer: Medicare HMO

## 2021-10-12 ENCOUNTER — Other Ambulatory Visit: Payer: Self-pay | Admitting: Physician Assistant

## 2021-10-12 ENCOUNTER — Ambulatory Visit: Payer: Medicare HMO

## 2021-10-12 ENCOUNTER — Telehealth: Payer: Self-pay | Admitting: Cardiology

## 2021-10-12 VITALS — BP 120/80 | HR 71 | Temp 97.9°F | Wt 194.0 lb

## 2021-10-12 DIAGNOSIS — Z0000 Encounter for general adult medical examination without abnormal findings: Secondary | ICD-10-CM | POA: Diagnosis not present

## 2021-10-12 NOTE — Patient Instructions (Addendum)
Patrick Roth , Thank you for taking time to come for your Medicare Wellness Visit. I appreciate your ongoing commitment to your health goals. Please review the following plan we discussed and let me know if I can assist you in the future.   Screening recommendations/referrals: Colonoscopy: Discontinued  Recommended yearly ophthalmology/optometry visit for glaucoma screening and checkup Recommended yearly dental visit for hygiene and checkup  Vaccinations: Influenza vaccine: postponed 3/23 Pneumococcal vaccine: Due and discussed Tdap vaccine: Done 07/05/13 repeat every 10 years  Shingles vaccine: Shingrix discussed. Please contact your pharmacy for coverage information.    Covid-19: Completed 2/1, 3/1, & 09/06/20  Advanced directives: Advance directive discussed with you today. I have provided a copy for you to complete at home and have notarized. Once this is complete please bring a copy in to our office so we can scan it into your chart.  Conditions/risks identified: to be able to take care of self   Next appointment: Follow up in one year for your annual wellness visit.   Preventive Care 36 Years and Older, Male Preventive care refers to lifestyle choices and visits with your health care provider that can promote health and wellness. What does preventive care include? A yearly physical exam. This is also called an annual well check. Dental exams once or twice a year. Routine eye exams. Ask your health care provider how often you should have your eyes checked. Personal lifestyle choices, including: Daily care of your teeth and gums. Regular physical activity. Eating a healthy diet. Avoiding tobacco and drug use. Limiting alcohol use. Practicing safe sex. Taking low doses of aspirin every day. Taking vitamin and mineral supplements as recommended by your health care provider. What happens during an annual well check? The services and screenings done by your health care provider  during your annual well check will depend on your age, overall health, lifestyle risk factors, and family history of disease. Counseling  Your health care provider may ask you questions about your: Alcohol use. Tobacco use. Drug use. Emotional well-being. Home and relationship well-being. Sexual activity. Eating habits. History of falls. Memory and ability to understand (cognition). Work and work Statistician. Screening  You may have the following tests or measurements: Height, weight, and BMI. Blood pressure. Lipid and cholesterol levels. These may be checked every 5 years, or more frequently if you are over 59 years old. Skin check. Lung cancer screening. You may have this screening every year starting at age 85 if you have a 30-pack-year history of smoking and currently smoke or have quit within the past 15 years. Fecal occult blood test (FOBT) of the stool. You may have this test every year starting at age 67. Flexible sigmoidoscopy or colonoscopy. You may have a sigmoidoscopy every 5 years or a colonoscopy every 10 years starting at age 102. Prostate cancer screening. Recommendations will vary depending on your family history and other risks. Hepatitis C blood test. Hepatitis B blood test. Sexually transmitted disease (STD) testing. Diabetes screening. This is done by checking your blood sugar (glucose) after you have not eaten for a while (fasting). You may have this done every 1-3 years. Abdominal aortic aneurysm (AAA) screening. You may need this if you are a current or former smoker. Osteoporosis. You may be screened starting at age 53 if you are at high risk. Talk with your health care provider about your test results, treatment options, and if necessary, the need for more tests. Vaccines  Your health care provider may recommend certain vaccines,  such as: Influenza vaccine. This is recommended every year. Tetanus, diphtheria, and acellular pertussis (Tdap, Td) vaccine. You  may need a Td booster every 10 years. Zoster vaccine. You may need this after age 4. Pneumococcal 13-valent conjugate (PCV13) vaccine. One dose is recommended after age 26. Pneumococcal polysaccharide (PPSV23) vaccine. One dose is recommended after age 54. Talk to your health care provider about which screenings and vaccines you need and how often you need them. This information is not intended to replace advice given to you by your health care provider. Make sure you discuss any questions you have with your health care provider. Document Released: 12/05/2015 Document Revised: 07/28/2016 Document Reviewed: 09/09/2015 Elsevier Interactive Patient Education  2017 [place] Prevention in the Home Falls can cause injuries. They can happen to people of all ages. There are many things you can do to make your home safe and to help prevent falls. What can I do on the outside of my home? Regularly fix the edges of walkways and driveways and fix any cracks. Remove anything that might make you trip as you walk through a door, such as a raised step or threshold. Trim any bushes or trees on the path to your home. Use bright outdoor lighting. Clear any walking paths of anything that might make someone trip, such as rocks or tools. Regularly check to see if handrails are loose or broken. Make sure that both sides of any steps have handrails. Any raised decks and porches should have guardrails on the edges. Have any leaves, snow, or ice cleared regularly. Use sand or salt on walking paths during winter. Clean up any spills in your garage right away. This includes oil or grease spills. What can I do in the bathroom? Use night lights. Install grab bars by the toilet and in the tub and shower. Do not use towel bars as grab bars. Use non-skid mats or decals in the tub or shower. If you need to sit down in the shower, use a plastic, non-slip stool. Keep the floor dry. Clean up any water that spills  on the floor as soon as it happens. Remove soap buildup in the tub or shower regularly. Attach bath mats securely with double-sided non-slip rug tape. Do not have throw rugs and other things on the floor that can make you trip. What can I do in the bedroom? Use night lights. Make sure that you have a light by your bed that is easy to reach. Do not use any sheets or blankets that are too big for your bed. They should not hang down onto the floor. Have a firm chair that has side arms. You can use this for support while you get dressed. Do not have throw rugs and other things on the floor that can make you trip. What can I do in the kitchen? Clean up any spills right away. Avoid walking on wet floors. Keep items that you use a lot in easy-to-reach places. If you need to reach something above you, use a strong step stool that has a grab bar. Keep electrical cords out of the way. Do not use floor polish or wax that makes floors slippery. If you must use wax, use non-skid floor wax. Do not have throw rugs and other things on the floor that can make you trip. What can I do with my stairs? Do not leave any items on the stairs. Make sure that there are handrails on both sides of the stairs and  use them. Fix handrails that are broken or loose. Make sure that handrails are as long as the stairways. Check any carpeting to make sure that it is firmly attached to the stairs. Fix any carpet that is loose or worn. Avoid having throw rugs at the top or bottom of the stairs. If you do have throw rugs, attach them to the floor with carpet tape. Make sure that you have a light switch at the top of the stairs and the bottom of the stairs. If you do not have them, ask someone to add them for you. What else can I do to help prevent falls? Wear shoes that: Do not have high heels. Have rubber bottoms. Are comfortable and fit you well. Are closed at the toe. Do not wear sandals. If you use a stepladder: Make  sure that it is fully opened. Do not climb a closed stepladder. Make sure that both sides of the stepladder are locked into place. Ask someone to hold it for you, if possible. Clearly mark and make sure that you can see: Any grab bars or handrails. First and last steps. Where the edge of each step is. Use tools that help you move around (mobility aids) if they are needed. These include: Canes. Walkers. Scooters. Crutches. Turn on the lights when you go into a dark area. Replace any light bulbs as soon as they burn out. Set up your furniture so you have a clear path. Avoid moving your furniture around. If any of your floors are uneven, fix them. If there are any pets around you, be aware of where they are. Review your medicines with your doctor. Some medicines can make you feel dizzy. This can increase your chance of falling. Ask your doctor what other things that you can do to help prevent falls. This information is not intended to replace advice given to you by your health care provider. Make sure you discuss any questions you have with your health care provider. Document Released: 09/04/2009 Document Revised: 04/15/2016 Document Reviewed: 12/13/2014 Elsevier Interactive Patient Education  2017 [employer].

## 2021-10-12 NOTE — Telephone Encounter (Signed)
[facility] called and mentioned that a patient may have overdosed on taking too many Evolocumab (REPATHA SURECLICK) 888 MG/ML SOAJ. Sent to triage

## 2021-10-12 NOTE — Telephone Encounter (Signed)
[facility] called and mentioned that a patient may have overdosed on taking too many Evolocumab (REPATHA SURECLICK) 578 MG/ML SOAJ. Sent to triage

## 2021-10-12 NOTE — Progress Notes (Addendum)
Subjective:   Patrick Roth is a 75 y.o. male who presents for Medicare Annual/Subsequent preventive examination.  Review of Systems           Objective:    Today's Vitals   10/12/21 1348 10/12/21 1354  BP: 120/80   Pulse: 71   Temp: 97.9 F (36.6 C)   SpO2: 97%   Weight: 194 lb (88 kg)   PainSc:  8    Body mass index is 29.07 kg/m.  Advanced Directives 10/12/2021 10/05/2021 09/30/2021 09/13/2021 02/03/2021 01/17/2021 01/16/2021  Does Patient Have a Medical Advance Directive? No No No No No No No  Would patient like information on creating a medical advance directive? Yes (MAU/Ambulatory/Procedural Areas - Information given) No - Patient declined No - Guardian declined No - Patient declined - No - Patient declined -    Current Medications (verified) Outpatient Encounter Medications as of 10/12/2021  Medication Sig   acetaminophen (TYLENOL) 650 MG CR tablet Take 1,300 mg by mouth every 8 (eight) hours as needed for pain.   amLODipine (NORVASC) 10 MG tablet Take 10 mg by mouth daily.   apixaban (ELIQUIS) 5 MG TABS tablet Take 1 tablet (5 mg total) by mouth 2 (two) times daily.   chlorthalidone (HYGROTON) 25 MG tablet Take 1 tablet (25 mg total) by mouth daily.   Evolocumab (REPATHA SURECLICK) 881 MG/ML SOAJ Inject 1 pen into the skin every 14 (fourteen) days.   metoprolol tartrate (LOPRESSOR) 25 MG tablet Take 1 tablet (25 mg total) by mouth 2 (two) times daily as needed (FOR HEARTRATE HIGHER THAN 120 bpm).   oxyCODONE-acetaminophen (PERCOCET/ROXICET) 5-325 MG tablet Take 1 tablet by mouth every 4 (four) hours as needed for severe pain.   polyethylene glycol powder (GLYCOLAX/MIRALAX) 17 GM/SCOOP powder Take 17 g by mouth in the morning and at bedtime. Start with 1 scoop 2 times per day until bowels are moving, then reduce to 1 scoop daily. Drink 64oz of water daily.   potassium chloride SA (KLOR-CON) 20 MEQ tablet TAKE 1 TABLET BY MOUTH IN THE MORNING AND TAKE 2 TABLETS IN THE  EVENING   psyllium (METAMUCIL) 58.6 % powder Take 1 packet by mouth daily as needed (constipation).   Magnesium Oxide 400 MG CAPS Take 1 capsule (400 mg total) by mouth 2 (two) times daily. (Patient not taking: Reported on 10/05/2021)   No facility-administered encounter medications on file as of 10/12/2021.    Allergies (verified) Crestor [rosuvastatin calcium], Spironolactone, Terazosin, Amiodarone, Atenolol, Bystolic [nebivolol hcl], Hydralazine, Tizanidine, Amlodipine, Clonidine, Hydrocodone-acetaminophen, Levofloxacin, Losartan, Maxzide [hydrochlorothiazide w-triamterene], Olmesartan, Tape, and Tussionex pennkinetic er [hydrocod polst-cpm polst er]   History: Past Medical History:  Diagnosis Date   Atrial flutter ([place])    Colon polyps    Constipation 05/27/2015   DOE (dyspnea on exertion) 10/13/2015   Faintness 06/20/2016   History of chicken pox    Hyperlipemia 02/13/2016   Hyperlipidemia    Hypertension    Hypokalemia 06/28/2016   Hyponatremia 06/28/2016   Internal hemorrhoids    Irregular heartbeat    Malignant hypertension 07/15/2015   Sciatica    Stroke ([place]) 2008   Syncope and collapse 06/28/2016   Past Surgical History:  Procedure Laterality Date   CARDIOVERSION N/A 09/02/2017   Procedure: CARDIOVERSION;  Surgeon: [name], MD;  Location: [facility];  Service: Cardiovascular;  Laterality: N/A;   COLONOSCOPY  2011   great toe surgery     PROSTATE BIOPSY     Family History  Problem Relation Age of Onset   Hypertension Father 88       Deceased   Heart disease Father    Lung cancer Mother 41       Deceased   Healthy Sister        x3   Healthy Brother        x4   Heart disease Brother        #5   Other Daughter        Alpha Thalassemia   Lupus Daughter        #2-deceased   Diabetes Neg Hx    Heart attack Neg Hx    Hyperlipidemia Neg Hx    Sudden death Neg Hx    Social History   Socioeconomic History   Marital status: Married    Spouse name:  [name]   Number of children: 1   Years of education: 12   Highest education level: Not on file  Occupational History   Occupation: Retired  Tobacco Use   Smoking status: Never   Smokeless tobacco: Never  Vaping Use   Vaping Use: Never used  Substance and Sexual Activity   Alcohol use: No   Drug use: No   Sexual activity: Not on file  Other Topics Concern   Not on file  Social History Narrative   Lives with wife   Right Handed   Drinks no caffeine   Social Determinants of Health   Financial Resource Strain: Low Risk    Difficulty of Paying Living Expenses: Not hard at all  Food Insecurity: No Food Insecurity   Worried About Charity fundraiser in the Last Year: Never true   Arboriculturist in the Last Year: Never true  Transportation Needs: No Transportation Needs   Lack of Transportation (Medical): No   Lack of Transportation (Non-Medical): No  Physical Activity: Inactive   Days of Exercise per Week: 0 days   Minutes of Exercise per Session: 0 min  Stress: No Stress Concern Present   Feeling of Stress : Not at all  Social Connections: Moderately Isolated   Frequency of Communication with Friends and Family: More than three times a week   Frequency of Social Gatherings with Friends and Family: More than three times a week   Attends Religious Services: Never   Marine scientist or Organizations: No   Attends Music therapist: Never   Marital Status: Married    Tobacco Counseling Counseling given: Not Answered   Clinical Intake:  Pre-visit preparation completed: Yes  Pain : 0-10 Pain Score: 8  Pain Type: Chronic pain Pain Location: Shoulder Pain Orientation: Right Pain Descriptors / Indicators: Sore Pain Onset: 1 to 4 weeks ago Pain Frequency: Intermittent (only lifting it up and down)     BMI - recorded: 29.07 Nutritional Status: BMI 25 -29 Overweight Nutritional Risks: None Diabetes: No  How often do you need to have someone help  you when you read instructions, pamphlets, or other written materials from your doctor or pharmacy?: 1 - Never  Diabetic?No  Interpreter Needed?: No  Information entered by :: Charlott Rakes, LPN   Activities of Daily Living In your present state of health, do you have any difficulty performing the following activities: 10/12/2021 01/17/2021  Hearing? N N  Vision? N N  Difficulty concentrating or making decisions? N N  Walking or climbing stairs? N N  Dressing or bathing? N N  Doing errands, shopping? N [employer]  and eating ? N -  Using the Toilet? N -  In the past six months, have you accidently leaked urine? N -  Do you have problems with loss of bowel control? N -  Managing your Medications? N -  Managing your Finances? N -  Housekeeping or managing your Housekeeping? N -  Some recent data might be hidden    Patient Care Team: Allwardt, [name], PA-C as PCP - General (Physician Assistant) Constance Haw, MD as PCP - Electrophysiology (Cardiology) [name], Wonda Cheng, MD as PCP - Cardiology (Cardiology) Irene Shipper, MD as Consulting Physician (Gastroenterology) [name], Wonda Cheng, MD as Consulting Physician (Cardiology) Christy Sartorius, MD as Referring Physician (Urology) Inocencio Homes, [place] as Consulting Physician (Podiatry) Myrle Sheng, MD as Referring Physician (Neurosurgery) Begovich, Geradine Girt, DO (Sports Medicine) Edythe Clarity, [facility] (Pharmacist)  Indicate any recent Medical Services you may have received from other than [facility] providers in the past year (date may be approximate).     Assessment:   This is a routine wellness examination for Elige.  Hearing/Vision screen Hearing Screening - Comments:: Pt denies any hearing issues  Vision Screening - Comments:: Pt follow up with [facility] for annual exams  Dietary issues and exercise activities discussed: Current Exercise Habits: The patient does not participate in regular  exercise at present (stated yard work)   Goals Addressed             This Visit's Progress    Patient Stated       Patient Stated       To be able to care for self        Depression Screen PHQ 2/9 Scores 10/12/2021 01/28/2021 10/06/2020 10/23/2019 08/11/2018 08/03/2017 11/30/2016  PHQ - 2 Score 0 0 0 0 0 0 0  PHQ- 9 Score - 0 - 0 - - -    Fall Risk Fall Risk  10/12/2021 09/18/2021 01/28/2021 10/06/2020 10/23/2019  Falls in the past year? 1 0 0 0 0  Number falls in past yr: 1 - - 0 0  Injury with Fall? 0 - - 0 0  Comment no visible injury however soreness - - - -  Follow up - - - Falls prevention discussed Falls evaluation completed    FALL RISK PREVENTION PERTAINING TO THE HOME:  Any stairs in or around the home? Yes  If so, are there any without handrails? No  Home free of loose throw rugs in walkways, pet beds, electrical cords, etc? Yes  Adequate lighting in your home to reduce risk of falls? Yes   ASSISTIVE DEVICES UTILIZED TO PREVENT FALLS:  Life alert? No  Use of a cane, walker or w/c? No  Grab bars in the bathroom? No  Shower chair or bench in shower? No  Elevated toilet seat or a handicapped toilet? No   TIMED UP AND GO:  Was the test performed? Yes .  Gait steady and without device 10 sec Cognitive Function: MMSE - Mini Mental State Exam 08/11/2018 08/03/2017  Orientation to time 5 5  Orientation to Place 5 5  Registration 3 3  Attention/ Calculation 5 2  Recall 1 3  Language- name 2 objects 2 2  Language- repeat 1 1  Language- follow 3 step command 3 3  Language- read & follow direction 1 1  Write a sentence 1 1  Copy design 1 1  Total score 28 27     6CIT Screen 10/12/2021  What Year?  0 points  What month? 0 points  What time? 0 points  Count back from 20 0 points  Months in reverse 0 points  Repeat phrase 10 points  Total Score 10    Immunizations Immunization History  Administered Date(s) Administered   Influenza,inj,Quad PF,6+ Mos  07/23/2015   Influenza-Unspecified 07/23/2018   PFIZER(Purple Top)SARS-COV-2 Vaccination 12/24/2019, 01/21/2020, 09/06/2020   Tdap 07/05/2013    TDAP status: Up to date  Flu Vaccine status: Due, Education has been provided regarding the importance of this vaccine. Advised may receive this vaccine at local pharmacy or Health Dept. Aware to provide a copy of the vaccination record if obtained from local pharmacy or Health Dept. Verbalized acceptance and understanding. Postponed until 3/23  Pneumococcal vaccine status: Due, Education has been provided regarding the importance of this vaccine. Advised may receive this vaccine at local pharmacy or Health Dept. Aware to provide a copy of the vaccination record if obtained from local pharmacy or Health Dept. Verbalized acceptance and understanding.  Covid-19 vaccine status: Completed vaccines  Qualifies for Shingles Vaccine? Yes   Zostavax completed No   Shingrix Completed?: No.    Education has been provided regarding the importance of this vaccine. Patient has been advised to call insurance company to determine out of pocket expense if they have not yet received this vaccine. Advised may also receive vaccine at local pharmacy or Health Dept. Verbalized acceptance and understanding.  Screening Tests Health Maintenance  Topic Date Due   Pneumonia Vaccine 60+ Years old (1 - PCV) Never done   Zoster Vaccines- Shingrix (1 of 2) Never done   COVID-19 Vaccine (4 - Booster for Pfizer series) 11/01/2020   INFLUENZA VACCINE  02/19/2022 (Originally 06/22/2021)   TETANUS/TDAP  07/06/2023   Hepatitis C Screening  Completed   HPV VACCINES  Aged Out   COLONOSCOPY (Pts 45-23yrs Insurance coverage will need to be confirmed)  Discontinued    Health Maintenance  Health Maintenance Due  Topic Date Due   Pneumonia Vaccine 46+ Years old (1 - PCV) Never done   Zoster Vaccines- Shingrix (1 of 2) Never done   COVID-19 Vaccine (4 - Booster for Pfizer series)  11/01/2020    Colorectal cancer screening: No longer required. Discontinued    Additional Screening:  Hepatitis C Screening:  Completed 11/14/19  Vision Screening: Recommended annual ophthalmology exams for early detection of glaucoma and other disorders of the eye. Is the patient up to date with their annual eye exam?  Yes  Who is the provider or what is the name of the office in which the patient attends annual eye exams? [facility]  If pt is not established with a provider, would they like to be referred to a provider to establish care? No .   Dental Screening: Recommended annual dental exams for proper oral hygiene  Community Resource Referral / Chronic Care Management: CRR required this visit?  No   CCM required this visit?  No      Plan:     I have personally reviewed and noted the following in the patient's chart:   Medical and social history Use of alcohol, tobacco or illicit drugs  Current medications and supplements including opioid prescriptions. Patient is currently taking opioid prescriptions. Information provided to patient regarding non-opioid alternatives. Patient advised to discuss non-opioid treatment plan with their provider. Functional ability and status Nutritional status Physical activity Advanced directives List of other physicians Hospitalizations, surgeries, and ER visits in previous 12 months Vitals Screenings to  include cognitive, depression, and falls Referrals and appointments  In addition, I have reviewed and discussed with patient certain preventive protocols, quality metrics, and best practice recommendations. A written personalized care plan for preventive services as well as general preventive health recommendations were provided to patient.     Willette Brace, LPN   09/81/1914   Nurse Notes: pt stated he had taken an extra dose of evolocumab (repatha sureclick) 140mg , Cardiology notified , spoke with Nurse, Fuller Canada  who stated can start back in 2- 3 weeks from the dose administered 10/12/21. Pt understood with verbal understanding. Talked with patient about putting in his phone and a calendar to remind of dates to administer.

## 2021-10-12 NOTE — Telephone Encounter (Signed)
Returned call to PCP office. Pt gave 2nd injection of Repatha 7 days after his last one instead of 14 days after. No safety concerns with this (can be dosed at 420mg  once monthly which he'll still fall below). Advised them that pt can resume normal dosing schedule of Repatha 140mg  Q2W, although insurance may say too early to refill so his next injection may need to be closer to 3 weeks from now.

## 2021-10-13 NOTE — Progress Notes (Deleted)
PCP:  Allwardt, [name], PA-C Primary Cardiologist: Mertie Moores, MD Electrophysiologist: [name], MD   Patrick Roth is a 75 y.o. male seen today for [name], MD for {Blank single:19197::"cardiac clearance","post hospital follow up","acute visit due to ***","routine electrophysiology followup"}.  Since {Blank single:19197::"last being seen in our clinic","discharge from hospital"} the patient reports doing ***.  he denies chest pain, palpitations, dyspnea, PND, orthopnea, nausea, vomiting, dizziness, syncope, edema, weight gain, or early satiety.  Past Medical History:  Diagnosis Date   Atrial flutter ([place])    Colon polyps    Constipation 05/27/2015   DOE (dyspnea on exertion) 10/13/2015   Faintness 06/20/2016   History of chicken pox    Hyperlipemia 02/13/2016   Hyperlipidemia    Hypertension    Hypokalemia 06/28/2016   Hyponatremia 06/28/2016   Internal hemorrhoids    Irregular heartbeat    Malignant hypertension 07/15/2015   Sciatica    Stroke ([place]) 2008   Syncope and collapse 06/28/2016   Past Surgical History:  Procedure Laterality Date   CARDIOVERSION N/A 09/02/2017   Procedure: CARDIOVERSION;  Surgeon: [name], MD;  Location: [facility] ENDOSCOPY;  Service: Cardiovascular;  Laterality: N/A;   COLONOSCOPY  2011   great toe surgery     PROSTATE BIOPSY      Current Outpatient Medications  Medication Sig Dispense Refill   acetaminophen (TYLENOL) 650 MG CR tablet Take 1,300 mg by mouth every 8 (eight) hours as needed for pain.     amLODipine (NORVASC) 10 MG tablet Take 10 mg by mouth daily.     apixaban (ELIQUIS) 5 MG TABS tablet Take 1 tablet (5 mg total) by mouth 2 (two) times daily. 180 tablet 1   chlorthalidone (HYGROTON) 25 MG tablet Take 1 tablet (25 mg total) by mouth daily. 90 tablet 1   Evolocumab (REPATHA SURECLICK) 161 MG/ML SOAJ Inject 1 pen into the skin every 14 (fourteen) days. 6 mL 3   Magnesium Oxide 400 MG CAPS Take 1 capsule (400 mg  total) by mouth 2 (two) times daily. (Patient not taking: Reported on 10/05/2021) 60 capsule 0   metoprolol tartrate (LOPRESSOR) 25 MG tablet Take 1 tablet (25 mg total) by mouth 2 (two) times daily as needed (FOR HEARTRATE HIGHER THAN 120 bpm). 90 tablet 0   oxyCODONE-acetaminophen (PERCOCET/ROXICET) 5-325 MG tablet Take 1 tablet by mouth every 4 (four) hours as needed for severe pain. 12 tablet 0   polyethylene glycol powder (GLYCOLAX/MIRALAX) 17 GM/SCOOP powder Take 17 g by mouth in the morning and at bedtime. Start with 1 scoop 2 times per day until bowels are moving, then reduce to 1 scoop daily. Drink 64oz of water daily. 3350 g 1   potassium chloride SA (KLOR-CON) 20 MEQ tablet TAKE 1 TABLET BY MOUTH IN THE MORNING AND TAKE 2 TABLETS IN THE EVENING 270 tablet 1   psyllium (METAMUCIL) 58.6 % powder Take 1 packet by mouth daily as needed (constipation).     No current facility-administered medications for this visit.    Allergies  Allergen Reactions   Crestor [Rosuvastatin Calcium] Shortness Of Breath    Muscle cramps   Spironolactone Shortness Of Breath   Terazosin Shortness Of Breath, Palpitations and Other (See Comments)    Nerve pain   Amiodarone Other (See Comments)    Dropped pulse real low to 45-50, and made him feel very cold   Atenolol Other (See Comments)    Drowsiness and "flu sxs"   [employer]  Hcl] Other (See Comments)    GI issues   Hydralazine Other (See Comments)    Joint swelling   Tizanidine Other (See Comments)    Syncope, Elevated BP/pulse   Amlodipine Other (See Comments)   Clonidine Other (See Comments)   Hydrocodone-Acetaminophen Other (See Comments)   Levofloxacin Other (See Comments)   Losartan Other (See Comments)    Did not work.  Pt states he couldn't take b/c of SE, but can't remember what SE were.   Maxzide [Hydrochlorothiazide W-Triamterene] Other (See Comments)    Does not tolerate potassium-sparing diuretics   Olmesartan Other (See  Comments)   Tape Other (See Comments)    Other reaction(s): Unknown   Tussionex Pennkinetic Er [Hydrocod Polst-Cpm Polst Er] Other (See Comments)    Other reaction(s): Unknown    Social History   Socioeconomic History   Marital status: Married    Spouse name: [name]   Number of children: 1   Years of education: 12   Highest education level: Not on file  Occupational History   Occupation: Retired  Tobacco Use   Smoking status: Never   Smokeless tobacco: Never  Vaping Use   Vaping Use: Never used  Substance and Sexual Activity   Alcohol use: No   Drug use: No   Sexual activity: Not on file  Other Topics Concern   Not on file  Social History Narrative   Lives with wife   Right Handed   Drinks no caffeine   Social Determinants of Health   Financial Resource Strain: Low Risk    Difficulty of Paying Living Expenses: Not hard at all  Food Insecurity: No Food Insecurity   Worried About Charity fundraiser in the Last Year: Never true   [place] in the Last Year: Never true  Transportation Needs: No Transportation Needs   Lack of Transportation (Medical): No   Lack of Transportation (Non-Medical): No  Physical Activity: Inactive   Days of Exercise per Week: 0 days   Minutes of Exercise per Session: 0 min  Stress: No Stress Concern Present   Feeling of Stress : Not at all  Social Connections: Moderately Isolated   Frequency of Communication with Friends and Family: More than three times a week   Frequency of Social Gatherings with Friends and Family: More than three times a week   Attends Religious Services: Never   Marine scientist or Organizations: No   Attends Music therapist: Never   Marital Status: Married  Human resources officer Violence: Not At Risk   Fear of Current or Ex-Partner: No   Emotionally Abused: No   Physically Abused: No   Sexually Abused: No     Review of Systems: All other systems reviewed and are otherwise negative  except as noted above.  Physical Exam: There were no vitals filed for this visit.  GEN- The patient is well appearing, alert and oriented x 3 today.   HEENT: normocephalic, atraumatic; sclera clear, conjunctiva pink; hearing intact; oropharynx clear; neck supple, no JVP Lymph- no cervical lymphadenopathy Lungs- Clear to ausculation bilaterally, normal work of breathing.  No wheezes, rales, rhonchi Heart- Regular rate and rhythm, no murmurs, rubs or gallops, PMI not laterally displaced GI- soft, non-tender, non-distended, bowel sounds present, no hepatosplenomegaly Extremities- no clubbing, cyanosis, or edema; DP/PT/radial pulses 2+ bilaterally MS- no significant deformity or atrophy Skin- warm and dry, no rash or lesion Psych- euthymic mood, full affect Neuro- strength and sensation are intact  EKG {ACTION; IS/IS [id number]} ordered. Personal review of EKG from {Blank single:19197::"today","***"} shows ***  Additional studies reviewed include: Previous EP office notes. ***  Assessment and Plan:  1.  Paroxysmal AFib NSR last week, though notes by various practitioners have described persistent and permanant CHA2DS2Vasc is 5, on Eliquis, appropriately dosed He has baseline conduction system disease trifascicular block and SB at baseline    These symptoms are not necessarily completely new but worse in the last 3-4 weeks ?if provoked by COVID??   Reviewed with Dr. [name] briefly, felt like he had some options We discussed treatment options AAD, Tiosyn 2.   Ablation (PVI) Both of which he states Dr. [name] has previously discussed 3. PPM for tachy/brady with base sinus rates 50's-60's and trifascicular block and RVR   We discussed all 3, today with the patient and his wife, he is not to thrilled about the idea of more medicine, but understands that each option except ablation (perhaps) involves additional medicine.   He seems to probably be leaning towards pacer strategy but  wouldl ike to give it some thought In the meantime, I am reluctant about a daily nodal blocker with his conduction disease I have decided to place a Zio AT today to moitor for advanced heart block particularaly given we know he has RVR.   I have given him lopressor 25mg  BID PRN for HR >120/symptoms Discussed NOT to use if HR is not >120 Discussed ER return precautions     2. HTN Stable on current regimen    3.  H/o Medicine non-compliance Recently reports compliance.  Earlier this month held eliquis for prep for endoscopy which was cancelled due to recent COVID +   Follow up with {Blank single:19197::"Dr. Allred","Dr. Arlan Organ. Klein","Dr. Camnitz","Dr. Lambert","EP APP"} in {Blank single:19197::"2 weeks","4 weeks","3 months","6 months","12 months","as usual post gen change"}   Shirley Friar, PA-C  10/13/21 9:14 AM

## 2021-10-14 ENCOUNTER — Ambulatory Visit: Payer: Medicare HMO | Admitting: Student

## 2021-10-14 DIAGNOSIS — I10 Essential (primary) hypertension: Secondary | ICD-10-CM

## 2021-10-14 DIAGNOSIS — I484 Atypical atrial flutter: Secondary | ICD-10-CM

## 2021-10-14 DIAGNOSIS — I480 Paroxysmal atrial fibrillation: Secondary | ICD-10-CM

## 2021-10-14 DIAGNOSIS — I453 Trifascicular block: Secondary | ICD-10-CM

## 2021-10-19 ENCOUNTER — Other Ambulatory Visit: Payer: Self-pay

## 2021-10-19 ENCOUNTER — Ambulatory Visit (INDEPENDENT_AMBULATORY_CARE_PROVIDER_SITE_OTHER): Payer: Medicare HMO | Admitting: Physician Assistant

## 2021-10-19 ENCOUNTER — Encounter: Payer: Self-pay | Admitting: Physician Assistant

## 2021-10-19 VITALS — BP 130/72 | HR 60 | Temp 97.2°F | Ht 68.5 in | Wt 197.0 lb

## 2021-10-19 DIAGNOSIS — I480 Paroxysmal atrial fibrillation: Secondary | ICD-10-CM | POA: Diagnosis not present

## 2021-10-19 DIAGNOSIS — K5903 Drug induced constipation: Secondary | ICD-10-CM

## 2021-10-19 NOTE — Patient Instructions (Addendum)
I wonder if you would benefit from [facility] for your constipation issues. I will contact Dr. Henrene Pastor and get back to you about this.  Please continue regular follow up with your cardiologist.   See you back as scheduled.  Happy holidays to you.

## 2021-10-19 NOTE — Progress Notes (Signed)
Subjective:    Patient ID: Patrick Roth, male    DOB: August 20, 1946, 75 y.o.   MRN: [id number]  Chief Complaint  Patient presents with   Bradycardia    Would like to discuss getting a Pacemaker.    HPI Patient is in today for discussion about getting a pacemaker. He's not sure if this is the right option for him at this time or not.   He was recently in the emergency department on 10/04/2021 where he presented in atrial flutter and ultimately went through sedation and electrocardioversion. Presented again the next day on 10/05/2021 for dizziness and then followed up with his cardiologist who discussed three treatment options:  AAD, Tiosyn 2.   Ablation (PVI) Both of which he states Dr. [name] has previously discussed 3. PPM for tachy/brady with base sinus rates 50's-60's and trifascicular block and RVR  He had a Zio AT placed to monitor for advanced heart block given that he has known RVR. He was also given lopressor 25 mg BID prn for HR >120 bpm + symptoms.   He is still waiting to hear back about results of the Zio patch.   He wonders how much of his heart troubles are related to his chronic pain as well. Back, jaw, dental, shoulder pains. He goes to pain management and receives injections occasionally for his pain. He takes Percocet prn pain only at night. Pain feels worst in the mornings or when he lays down at night. As a result, he is also having chronic constipation, which has led to some near vasovagal incidents with bowel movements. Metamucil and Miralax have not been successful at keeping him regular.  He has a follow-up with his GI doctor, Dr. Henrene Pastor, in the new year.  Patient is completely asymptomatic in the office today.  Past Medical History:  Diagnosis Date   Atrial flutter ([place])    Colon polyps    Constipation 05/27/2015   DOE (dyspnea on exertion) 10/13/2015   Faintness 06/20/2016   History of chicken pox    Hyperlipemia 02/13/2016   Hyperlipidemia    Hypertension     Hypokalemia 06/28/2016   Hyponatremia 06/28/2016   Internal hemorrhoids    Irregular heartbeat    Malignant hypertension 07/15/2015   Sciatica    Stroke [facility]) 2008   Syncope and collapse 06/28/2016    Past Surgical History:  Procedure Laterality Date   CARDIOVERSION N/A 09/02/2017   Procedure: CARDIOVERSION;  Surgeon: [name], MD;  Location: [facility] ENDOSCOPY;  Service: Cardiovascular;  Laterality: N/A;   COLONOSCOPY  2011   great toe surgery     PROSTATE BIOPSY      Family History  Problem Relation Age of Onset   Hypertension Father 61       Deceased   Heart disease Father    Lung cancer Mother 24       Deceased   Healthy Sister        x3   Healthy Brother        x4   Heart disease Brother        #5   Other Daughter        Alpha Thalassemia   Lupus Daughter        #2-deceased   Diabetes Neg Hx    Heart attack Neg Hx    Hyperlipidemia Neg Hx    Sudden death Neg Hx     Social History   Tobacco Use   Smoking status: Never   Smokeless  tobacco: Never  Vaping Use   Vaping Use: Never used  Substance Use Topics   Alcohol use: No   Drug use: No     Allergies  Allergen Reactions   Crestor [Rosuvastatin Calcium] Shortness Of Breath    Muscle cramps   Spironolactone Shortness Of Breath   Terazosin Shortness Of Breath, Palpitations and Other (See Comments)    Nerve pain   Amiodarone Other (See Comments)    Dropped pulse real low to 45-50, and made him feel very cold   Atenolol Other (See Comments)    Drowsiness and "flu sxs"   Bystolic [Nebivolol Hcl] Other (See Comments)    GI issues   Hydralazine Other (See Comments)    Joint swelling   Tizanidine Other (See Comments)    Syncope, Elevated BP/pulse   Amlodipine Other (See Comments)   Clonidine Other (See Comments)   Hydrocodone-Acetaminophen Other (See Comments)   Levofloxacin Other (See Comments)   Losartan Other (See Comments)    Did not work.  Pt states he couldn't take b/c of SE, but can't remember  what SE were.   Maxzide [Hydrochlorothiazide W-Triamterene] Other (See Comments)    Does not tolerate potassium-sparing diuretics   Olmesartan Other (See Comments)   Tape Other (See Comments)    Other reaction(s): Unknown   Tussionex Pennkinetic Er [Hydrocod Polst-Cpm Polst Er] Other (See Comments)    Other reaction(s): Unknown    Review of Systems NEGATIVE UNLESS OTHERWISE INDICATED IN HPI      Objective:     BP 130/72   Pulse 60   Temp (!) 97.2 F (36.2 C) (Temporal)   Ht 5' 8.5" (1.74 m)   Wt 197 lb (89.4 kg)   SpO2 99%   BMI 29.52 kg/m   Wt Readings from Last 3 Encounters:  10/19/21 197 lb (89.4 kg)  10/12/21 194 lb (88 kg)  10/05/21 193 lb 9.6 oz (87.8 kg)    BP Readings from Last 3 Encounters:  10/19/21 130/72  10/12/21 120/80  10/05/21 118/70     Physical Exam Constitutional:      Appearance: Normal appearance. He is normal weight.  Cardiovascular:     Rate and Rhythm: Normal rate. Rhythm irregular.     Heart sounds: Normal heart sounds. No murmur heard. Pulmonary:     Effort: Pulmonary effort is normal.  Abdominal:     General: Abdomen is flat.     Palpations: Abdomen is soft.     Tenderness: There is no abdominal tenderness.  Neurological:     Mental Status: He is alert.  Psychiatric:        Mood and Affect: Mood normal.        Behavior: Behavior normal.        Thought Content: Thought content normal.        Judgment: Judgment normal.       Assessment & Plan:   Problem List Items Addressed This Visit       Cardiovascular and Mediastinum   Paroxysmal atrial fibrillation (HCC)     Digestive   Constipation due to pain medication - Primary    Plan: -Patient seems confused and concerned about what to do regarding his medical decision making. Talking with him, he is concerned about having a pacemaker placed if medication alone can take care of his issues. I discussed with him that I think he needs to hear about the results of his Zio  monitoring and discuss with cardiology from there. -His chronic constipation 2/2  opioids does seem to be causing him grief and we talked about Linzess as a possibility. As he is established with GI, I would like to run this by them and get back to the patient right away. I have sent a phone message out to his office today.  -His vitals are stable. No symptoms today. Plan to continue to monitor patient closely for f/up.  This note was prepared with assistance of Systems analyst. Occasional wrong-word or sound-a-like substitutions may have occurred due to the inherent limitations of voice recognition software.  Time Spent: 39 minutes of total time was spent on the date of the encounter performing the following actions: chart review prior to seeing the patient, obtaining history, performing a medically necessary exam, counseling on the treatment plan, placing orders, and documenting in our EHR.    Kaidon Kinker M Gal Smolinski, PA-C

## 2021-10-20 ENCOUNTER — Encounter: Payer: Self-pay | Admitting: Physician Assistant

## 2021-10-23 ENCOUNTER — Telehealth: Payer: Self-pay | Admitting: Physician Assistant

## 2021-10-23 ENCOUNTER — Telehealth: Payer: Self-pay

## 2021-10-23 NOTE — Telephone Encounter (Signed)
Left voice message for patient to call clinic.  

## 2021-10-23 NOTE — Telephone Encounter (Signed)
  Pt c/o medication issue:  1. Name of Medication: metoprolol tartrate (LOPRESSOR) 25 MG tablet  2. How are you currently taking this medication (dosage and times per day)? Take 1 tablet (25 mg total) by mouth 2 (two) times daily as needed (FOR HEARTRATE HIGHER THAN 120 bpm).  3. Are you having a reaction (difficulty breathing--STAT)?   4. What is your medication issue? Pt said he tried taking this medication yesterday because his HR was up, but when he took it his HR keeps fluctuating, he would like to speak with a nurse about it

## 2021-10-23 NOTE — Telephone Encounter (Signed)
Pt called stating that since he saw Patrick Roth on 11/28 he is still having GI problems. Pt stated that his heart rate also went up twice since Monday. Pt wants to know what Patrick Roth would advise.

## 2021-10-23 NOTE — Telephone Encounter (Signed)
Patient states his heart rate goes up in the morning when he gets up due to lower back pain.    I am sending patient to triage.  Please follow up with on Monday.

## 2021-10-23 NOTE — Telephone Encounter (Signed)
Spoke with pt and is taking Metoprolol 25 mg bid routinely but yesterday and today has taken an  additional  50 mg  for HR of 175 Pt said yesterday the additional 50  mg helped but elevated rate returned this am at 4:30  Pt states has noted this occurs when having bowel movement B/P was 150/90 .Will forward to Tommye Standard PA for review ./cy/cy

## 2021-10-26 NOTE — Telephone Encounter (Signed)
Left voice message for patient to call clinic.  

## 2021-10-26 NOTE — Telephone Encounter (Signed)
Patient is feeling better I informed him to follow up if needed. Also had concerns about metoprolol he has two bottles concerned if he should take both informed him to take as prescribed,he just has a extra bottle. He voices understanding.

## 2021-10-27 ENCOUNTER — Emergency Department (HOSPITAL_BASED_OUTPATIENT_CLINIC_OR_DEPARTMENT_OTHER): Payer: Medicare HMO

## 2021-10-27 ENCOUNTER — Telehealth: Payer: Self-pay | Admitting: *Deleted

## 2021-10-27 ENCOUNTER — Encounter (HOSPITAL_BASED_OUTPATIENT_CLINIC_OR_DEPARTMENT_OTHER): Payer: Self-pay

## 2021-10-27 ENCOUNTER — Emergency Department (HOSPITAL_BASED_OUTPATIENT_CLINIC_OR_DEPARTMENT_OTHER)
Admission: EM | Admit: 2021-10-27 | Discharge: 2021-10-27 | Disposition: A | Discharge status: Home / Self Care | Payer: Medicare HMO | Attending: Emergency Medicine | Admitting: Emergency Medicine

## 2021-10-27 ENCOUNTER — Other Ambulatory Visit: Payer: Self-pay

## 2021-10-27 ENCOUNTER — Telehealth: Payer: Self-pay

## 2021-10-27 DIAGNOSIS — Z7901 Long term (current) use of anticoagulants: Secondary | ICD-10-CM | POA: Insufficient documentation

## 2021-10-27 DIAGNOSIS — R0602 Shortness of breath: Secondary | ICD-10-CM | POA: Diagnosis not present

## 2021-10-27 DIAGNOSIS — R000 Tachycardia, unspecified: Secondary | ICD-10-CM | POA: Diagnosis not present

## 2021-10-27 DIAGNOSIS — I482 Chronic atrial fibrillation, unspecified: Secondary | ICD-10-CM | POA: Diagnosis not present

## 2021-10-27 DIAGNOSIS — R55 Syncope and collapse: Secondary | ICD-10-CM | POA: Insufficient documentation

## 2021-10-27 DIAGNOSIS — M545 Low back pain, unspecified: Secondary | ICD-10-CM | POA: Diagnosis not present

## 2021-10-27 DIAGNOSIS — Z79899 Other long term (current) drug therapy: Secondary | ICD-10-CM | POA: Diagnosis not present

## 2021-10-27 DIAGNOSIS — R42 Dizziness and giddiness: Secondary | ICD-10-CM | POA: Insufficient documentation

## 2021-10-27 DIAGNOSIS — E876 Hypokalemia: Secondary | ICD-10-CM | POA: Insufficient documentation

## 2021-10-27 DIAGNOSIS — I4891 Unspecified atrial fibrillation: Secondary | ICD-10-CM | POA: Diagnosis not present

## 2021-10-27 DIAGNOSIS — I700 Atherosclerosis of aorta: Secondary | ICD-10-CM

## 2021-10-27 DIAGNOSIS — M25552 Pain in left hip: Secondary | ICD-10-CM | POA: Insufficient documentation

## 2021-10-27 LAB — URINALYSIS, ROUTINE W REFLEX MICROSCOPIC
Bilirubin Urine: NEGATIVE
Glucose, UA: NEGATIVE mg/dL
Hgb urine dipstick: NEGATIVE
Ketones, ur: NEGATIVE mg/dL
Leukocytes,Ua: NEGATIVE
Nitrite: NEGATIVE
Protein, ur: NEGATIVE mg/dL
Specific Gravity, Urine: 1.01 (ref 1.005–1.030)
pH: 6.5 (ref 5.0–8.0)

## 2021-10-27 LAB — CBC WITH DIFFERENTIAL/PLATELET
Abs Immature Granulocytes: 0.04 10*3/uL (ref 0.00–0.07)
Basophils Absolute: 0 10*3/uL (ref 0.0–0.1)
Basophils Relative: 1 %
Eosinophils Absolute: 0 10*3/uL (ref 0.0–0.5)
Eosinophils Relative: 0 %
HCT: 50.2 % (ref 39.0–52.0)
Hemoglobin: 16.7 g/dL (ref 13.0–17.0)
Immature Granulocytes: 1 %
Lymphocytes Relative: 14 %
Lymphs Abs: 0.9 10*3/uL (ref 0.7–4.0)
MCH: 29.3 pg (ref 26.0–34.0)
MCHC: 33.3 g/dL (ref 30.0–36.0)
MCV: 88.2 fL (ref 80.0–100.0)
Monocytes Absolute: 0.7 10*3/uL (ref 0.1–1.0)
Monocytes Relative: 11 %
Neutro Abs: 4.5 10*3/uL (ref 1.7–7.7)
Neutrophils Relative %: 73 %
Platelets: 199 10*3/uL (ref 150–400)
RBC: 5.69 MIL/uL (ref 4.22–5.81)
RDW: 13.7 % (ref 11.5–15.5)
WBC: 6.2 10*3/uL (ref 4.0–10.5)
nRBC: 0 % (ref 0.0–0.2)

## 2021-10-27 LAB — COMPREHENSIVE METABOLIC PANEL
ALT: 20 U/L (ref 0–44)
AST: 26 U/L (ref 15–41)
Albumin: 3.7 g/dL (ref 3.5–5.0)
Alkaline Phosphatase: 62 U/L (ref 38–126)
Anion gap: 11 (ref 5–15)
BUN: 15 mg/dL (ref 8–23)
CO2: 28 mmol/L (ref 22–32)
Calcium: 9.1 mg/dL (ref 8.9–10.3)
Chloride: 99 mmol/L (ref 98–111)
Creatinine, Ser: 1.31 mg/dL — ABNORMAL HIGH (ref 0.61–1.24)
GFR, Estimated: 56 mL/min — ABNORMAL LOW (ref >60)
Glucose, Bld: 116 mg/dL — ABNORMAL HIGH (ref 70–99)
Potassium: 3.1 mmol/L — ABNORMAL LOW (ref 3.5–5.1)
Sodium: 138 mmol/L (ref 135–145)
Total Bilirubin: 2.3 mg/dL — ABNORMAL HIGH (ref 0.3–1.2)
Total Protein: 7.2 g/dL (ref 6.5–8.1)

## 2021-10-27 LAB — TROPONIN I (HIGH SENSITIVITY)
Troponin I (High Sensitivity): 13 ng/L (ref <18)
Troponin I (High Sensitivity): 16 ng/L (ref <18)

## 2021-10-27 LAB — MAGNESIUM: Magnesium: 1.7 mg/dL (ref 1.7–2.4)

## 2021-10-27 LAB — BRAIN NATRIURETIC PEPTIDE: B Natriuretic Peptide: 133.7 pg/mL — ABNORMAL HIGH (ref 0.0–100.0)

## 2021-10-27 MED ORDER — POTASSIUM CHLORIDE CRYS ER 20 MEQ PO TBCR
40.0000 meq | EXTENDED_RELEASE_TABLET | Freq: Once | ORAL | Status: AC
  Administered 2021-10-27: 40 meq via ORAL
  Filled 2021-10-27: qty 2

## 2021-10-27 MED ORDER — SODIUM CHLORIDE 0.9 % IV BOLUS
500.0000 mL | Freq: Once | INTRAVENOUS | Status: AC
  Administered 2021-10-27: 500 mL via INTRAVENOUS

## 2021-10-27 NOTE — ED Triage Notes (Signed)
Pt states having pain low back and left hip., states pain worse past few days and often gets feeling of need to pass out when he tries to get up.  Hx a-fib, takes metoprolol  HR 89 on arrival.  Denies palpitations, denies chest pain at this time.

## 2021-10-27 NOTE — Telephone Encounter (Signed)
   Pre-operative Risk Assessment    Patient Name: ZAKAI GONYEA  DOB: 25-Dec-1945 MRN: [id number]      Request for Surgical Clearance   Procedure:   EGD  Date of Surgery: Clearance TBD                                 Surgeon:  NOT LISTED Surgeon's Group or Practice Name:  [facility] Phone number:  [phone] Fax number:  [phone]   Type of Clearance Requested: - Medical  - Pharmacy:  Hold Apixaban (Eliquis)     Type of Anesthesia:   MAC   Additional requests/questions:   Jiles Prows   10/27/2021, 2:13 PM

## 2021-10-27 NOTE — Discharge Instructions (Signed)
You are seen in the emergency department for continued atrial fibrillation and episodes of feeling like you might faint.  You had lab work done that showed your potassium was mildly low.  You are currently in atrial fibrillation and your rates are currently around 100.  You should take your metoprolol as directed by your cardiologist.  Please contact your cardiologist for close follow-up.  Return to the emergency department if any worsening or concerning symptoms

## 2021-10-27 NOTE — ED Provider Notes (Signed)
[facility] EMERGENCY DEPARTMENT Provider Note   CSN: [id number] Arrival date & time: 10/27/21  1041     History Chief Complaint  Patient presents with   Back Pain   Near Syncope    Patrick Roth is a 75 y.o. male.  He has a history of A. fib on anticoagulation.  He is here with a complaint of feeling dizzy lightheaded since waking up this morning.  He also has pain in his left-sided low back into his left hip.  This has been going on for at least 6 months.  He attributes the lightheadedness to straining to move his bowels in the morning which causes his heart rate to go up and cause him to feel like he is going to faint.  He took 2 doses of metoprolol this morning for heart rates greater than 120.  He follows with Dr. [name] cardiology.  Denies any chest pain or shortness of breath.  No headache blurry vision double vision numbness or weakness.  He was seen by cardiology just a few weeks ago and they are talking about doing a pacemaker.  He was feels to get an endoscopy today for some difficulty swallowing but they had to cancel the procedure due to his elevated heart rate.  He had COVID back in October.  The history is provided by the patient and the spouse.  Near Syncope This is a recurrent problem. The current episode started 3 to 5 hours ago. The problem occurs daily. The problem has not changed since onset.Pertinent negatives include no chest pain, no abdominal pain, no headaches and no shortness of breath. The symptoms are aggravated by standing. The symptoms are relieved by lying down. He has tried nothing for the symptoms. The treatment provided no relief.      Past Medical History:  Diagnosis Date   Atrial flutter ([place])    Colon polyps    Constipation 05/27/2015   DOE (dyspnea on exertion) 10/13/2015   Faintness 06/20/2016   History of chicken pox    Hyperlipemia 02/13/2016   Hyperlipidemia    Hypertension    Hypokalemia 06/28/2016   Hyponatremia 06/28/2016   Internal  hemorrhoids    Irregular heartbeat    Malignant hypertension 07/15/2015   Sciatica    Stroke [facility]) 2008   Syncope and collapse 06/28/2016    Patient Active Problem List   Diagnosis Date Noted   Paroxysmal atrial fibrillation ([place]) 05/26/2021   Polycythemia 05/26/2021   Statin myopathy 01/29/2021   Acute arterial ischemic stroke, multifocal, anterior circulation, left ([place]) 01/16/2021   Secondary hypercoagulable state ([place]) 09/11/2020   Cyst of perineum in male 11/28/2018   Mixed hyperlipidemia    History of chicken pox    Colon polyps    Atrial flutter with rapid ventricular response ([place])    Other intervertebral disc degeneration, lumbar region 05/09/2017   Lumbar radiculopathy 03/29/2017   Persistent atrial fibrillation ([place]) 09/14/2016   Diuretic-induced hypokalemia 06/28/2016   Essential hypertension 05/27/2015   Constipation due to pain medication 05/27/2015   Mallet deformity of fourth finger, right 07/11/2013   Stroke ([place]) 08/23/2012    Past Surgical History:  Procedure Laterality Date   CARDIOVERSION N/A 09/02/2017   Procedure: CARDIOVERSION;  Surgeon: [name], MD;  Location: [facility] ENDOSCOPY;  Service: Cardiovascular;  Laterality: N/A;   COLONOSCOPY  2011   great toe surgery     PROSTATE BIOPSY         Family History  Problem Relation Age of  Onset   Hypertension Father 43       Deceased   Heart disease Father    Lung cancer Mother 89       Deceased   Healthy Sister        x3   Healthy Brother        x4   Heart disease Brother        #5   Other Daughter        Alpha Thalassemia   Lupus Daughter        #2-deceased   Diabetes Neg Hx    Heart attack Neg Hx    Hyperlipidemia Neg Hx    Sudden death Neg Hx     Social History   Tobacco Use   Smoking status: Never   Smokeless tobacco: Never  Vaping Use   Vaping Use: Never used  Substance Use Topics   Alcohol use: No   Drug use: No    Home Medications Prior to Admission medications    Medication Sig Start Date End Date Taking? Authorizing Provider  acetaminophen (TYLENOL) 650 MG CR tablet Take 1,300 mg by mouth every 8 (eight) hours as needed for pain.    [provider]  amLODipine (NORVASC) 10 MG tablet Take 10 mg by mouth daily.    [provider]  apixaban (ELIQUIS) 5 MG TABS tablet Take 1 tablet (5 mg total) by mouth 2 (two) times daily. 06/16/21   Camnitz, Ocie Doyne, MD  chlorthalidone (HYGROTON) 25 MG tablet Take 1 tablet (25 mg total) by mouth daily. 05/19/21   Allwardt, [name], PA-C  Evolocumab (REPATHA SURECLICK) 948 MG/ML SOAJ Inject 1 pen into the skin every 14 (fourteen) days. 04/02/21   Camnitz, Ocie Doyne, MD  Magnesium Oxide 400 MG CAPS Take 1 capsule (400 mg total) by mouth 2 (two) times daily. Patient not taking: Reported on 54/62/7035 00/93/81   Delora Fuel, MD  metoprolol tartrate (LOPRESSOR) 25 MG tablet Take 1 tablet (25 mg total) by mouth 2 (two) times daily as needed (FOR HEARTRATE HIGHER THAN 120 bpm). 10/05/21   Baldwin Jamaica, PA-C  oxyCODONE-acetaminophen (PERCOCET/ROXICET) 5-325 MG tablet Take 1 tablet by mouth every 4 (four) hours as needed for severe pain. 01/24/20   Malvin Johns, MD  polyethylene glycol powder (GLYCOLAX/MIRALAX) 17 GM/SCOOP powder Take 17 g by mouth in the morning and at bedtime. Start with 1 scoop 2 times per day until bowels are moving, then reduce to 1 scoop daily. Drink 64oz of water daily. 09/18/21   Jeanie Sewer, NP  potassium chloride SA (KLOR-CON) 20 MEQ tablet TAKE 1 TABLET BY MOUTH IN THE MORNING AND TAKE 2 TABLETS IN THE EVENING 10/12/21   Allwardt, Alyssa M, PA-C  psyllium (METAMUCIL) 58.6 % powder Take 1 packet by mouth daily as needed (constipation).    [provider]    Allergies    Crestor [rosuvastatin calcium], Spironolactone, Terazosin, Amiodarone, Atenolol, Bystolic [nebivolol hcl], Hydralazine, Tizanidine, Amlodipine, Clonidine, Hydrocodone-acetaminophen, Levofloxacin,  Losartan, Maxzide [hydrochlorothiazide w-triamterene], Olmesartan, Tape, and Tussionex pennkinetic er [hydrocod polst-cpm polst er]  Review of Systems   Review of Systems  Constitutional:  Negative for fever.  HENT:  Negative for sore throat.   Eyes:  Negative for visual disturbance.  Respiratory:  Negative for shortness of breath.   Cardiovascular:  Positive for near-syncope. Negative for chest pain.  Gastrointestinal:  Positive for nausea. Negative for abdominal pain.  Genitourinary:  Negative for dysuria.  Musculoskeletal:  Positive for back pain.  Skin:  Negative for rash.  Neurological:  Positive for dizziness and light-headedness. Negative for headaches.   Physical Exam Updated Vital Signs BP 131/87 (BP Location: Left Arm)   Pulse 81   Temp 98.3 F (36.8 C) (Oral)   Resp 20   Ht 5\' 8"  (1.727 m)   Wt 89.4 kg   SpO2 98%   BMI 29.95 kg/m   Physical Exam Vitals and nursing note reviewed.  Constitutional:      General: He is not in acute distress.    Appearance: Normal appearance. He is well-developed.  HENT:     Head: Normocephalic and atraumatic.  Eyes:     Conjunctiva/sclera: Conjunctivae normal.  Cardiovascular:     Rate and Rhythm: Tachycardia present. Rhythm irregular.     Heart sounds: No murmur heard. Pulmonary:     Effort: Pulmonary effort is normal. No respiratory distress.     Breath sounds: Normal breath sounds.  Abdominal:     Palpations: Abdomen is soft.     Tenderness: There is no abdominal tenderness. There is no guarding or rebound.  Musculoskeletal:        General: No swelling, deformity or signs of injury.     Cervical back: Neck supple.  Skin:    General: Skin is warm and dry.     Capillary Refill: Capillary refill takes less than 2 seconds.  Neurological:     General: No focal deficit present.     Mental Status: He is alert.     Sensory: No sensory deficit.     Motor: No weakness.  Psychiatric:        Mood and Affect: Mood normal.     ED Results / Procedures / Treatments   Labs (all labs ordered are listed, but only abnormal results are displayed) Labs Reviewed  COMPREHENSIVE METABOLIC PANEL - Abnormal; Notable for the following components:      Result Value   Potassium 3.1 (*)    Glucose, Bld 116 (*)    Creatinine, Ser 1.31 (*)    Total Bilirubin 2.3 (*)    GFR, Estimated 56 (*)    All other components within normal limits  BRAIN NATRIURETIC PEPTIDE - Abnormal; Notable for the following components:   B Natriuretic Peptide 133.7 (*)    All other components within normal limits  CBC WITH DIFFERENTIAL/PLATELET  URINALYSIS, ROUTINE W REFLEX MICROSCOPIC  MAGNESIUM  TROPONIN I (HIGH SENSITIVITY)  TROPONIN I (HIGH SENSITIVITY)    EKG None done, did not cross into epic from muse  Radiology DG Chest Port 1 View  Result Date: 10/27/2021 CLINICAL DATA:  Shortness of breath EXAM: PORTABLE CHEST 1 VIEW COMPARISON:  10/04/2021 FINDINGS: The heart size and mediastinal contours are within normal limits. Both lungs are clear. The visualized skeletal structures are unremarkable. IMPRESSION: No active disease. Electronically Signed   By: Elmer Picker M.D.   On: 10/27/2021 11:44    Procedures Procedures   Medications Ordered in ED Medications  potassium chloride SA (KLOR-CON M) CR tablet 40 mEq (40 mEq Oral Given 10/27/21 1219)  sodium chloride 0.9 % bolus 500 mL (0 mLs Intravenous Stopped 10/27/21 1300)    ED Course  I have reviewed the triage vital signs and the nursing notes.  Pertinent labs & imaging results that were available during my care of the patient were reviewed by me and considered in my medical decision making (see chart for details).  Clinical Course as of 10/27/21 1755  Tue Oct 27, 2021  1119 EKG not  crossing from muse into epic.  A. fib with rapid ventricular response rate of 121 right bundle branch left anterior fascicular block PVCs.  A. fib is new from prior EKG 11/22. [MB]  1119  Discussed with [facility] cardiology Dr. Harriet Masson.  She reviewed the patient's chart and labs and did not feel he needed to be admitted to the hospital.  She also agreed she did not think cardioversion would be very effective as he was just cardioverted a few weeks ago.  Recommends close follow-up with Dr. [name] and the A. fib clinic. [MB]  6195 Chest x-ray interpreted by me as no acute infiltrates.  Awaiting radiology reading. [MB]  1330 I reviewed this with the patient he is comfortable plan.  He is not particularly symptomatic currently. [MB]    Clinical Course User Index [MB] Hayden Rasmussen, MD   MDM Rules/Calculators/A&P                          This patient complains of dizziness lightheadedness in the setting of straining to have a bowel movement after exacerbation of his chronic left-sided low back and hip pain; this involves an extensive number of treatment Options and is a complaint that carries with it a high risk of complications and Morbidity. The differential includes rapid A. fib, arrhythmia, dehydration, anemia, UTI, food, metabolic derangement, ACS  I ordered, reviewed and interpreted labs, which included CBC with normal white count normal hemoglobin, chemistries with mildly low potassium and elevated creatinine, troponins flat, BNP mildly elevated lower than priors, magnesium normal, urinalysis normal I ordered medication IV fluids and oral potassium supplementation I ordered imaging studies which included chest x-ray and I independently    visualized and interpreted imaging which showed no acute findings Additional history obtained from patient's wife Previous records obtained and reviewed in epic including prior ED visits for similar presentations and recent cardiology note I consulted cardiology Dr. Harriet Masson and discussed lab and imaging findings  Critical Interventions: None  After the interventions stated above, I reevaluated the patient and found patient to be asymptomatic  currently.  Heart rate between 95 and 110.  Recommended close follow-up with his cardiologist.  It sounds like they are leaning towards getting ablation and pacemaker.  Return instructions discussed   Final Clinical Impression(s) / ED Diagnoses Final diagnoses:  Near syncope  Chronic atrial fibrillation ([place])  Hypokalemia    Rx / DC Orders ED Discharge Orders     None        Hayden Rasmussen, MD 10/27/21 1758

## 2021-10-27 NOTE — Telephone Encounter (Signed)
Patient is returning a call from someone, states there was no info in the voicemail.

## 2021-10-27 NOTE — Telephone Encounter (Signed)
Patient notified of results from monitor

## 2021-10-28 ENCOUNTER — Emergency Department (HOSPITAL_BASED_OUTPATIENT_CLINIC_OR_DEPARTMENT_OTHER): Payer: Medicare HMO

## 2021-10-28 ENCOUNTER — Inpatient Hospital Stay (HOSPITAL_BASED_OUTPATIENT_CLINIC_OR_DEPARTMENT_OTHER)
Admission: EM | Admit: 2021-10-28 | Discharge: 2021-11-01 | DRG: 310 | Disposition: A | Discharge status: Home / Self Care | Payer: Medicare HMO | Attending: Internal Medicine | Admitting: Internal Medicine

## 2021-10-28 ENCOUNTER — Encounter (HOSPITAL_BASED_OUTPATIENT_CLINIC_OR_DEPARTMENT_OTHER): Payer: Self-pay

## 2021-10-28 DIAGNOSIS — K594 Anal spasm: Secondary | ICD-10-CM | POA: Diagnosis present

## 2021-10-28 DIAGNOSIS — E782 Mixed hyperlipidemia: Secondary | ICD-10-CM | POA: Diagnosis present

## 2021-10-28 DIAGNOSIS — I480 Paroxysmal atrial fibrillation: Principal | ICD-10-CM | POA: Diagnosis present

## 2021-10-28 DIAGNOSIS — R0602 Shortness of breath: Secondary | ICD-10-CM | POA: Diagnosis present

## 2021-10-28 DIAGNOSIS — Z8616 Personal history of COVID-19: Secondary | ICD-10-CM

## 2021-10-28 DIAGNOSIS — Z8249 Family history of ischemic heart disease and other diseases of the circulatory system: Secondary | ICD-10-CM

## 2021-10-28 DIAGNOSIS — E876 Hypokalemia: Secondary | ICD-10-CM | POA: Diagnosis present

## 2021-10-28 DIAGNOSIS — M5416 Radiculopathy, lumbar region: Secondary | ICD-10-CM | POA: Diagnosis present

## 2021-10-28 DIAGNOSIS — I4892 Unspecified atrial flutter: Secondary | ICD-10-CM | POA: Diagnosis not present

## 2021-10-28 DIAGNOSIS — R1032 Left lower quadrant pain: Secondary | ICD-10-CM | POA: Diagnosis present

## 2021-10-28 DIAGNOSIS — Z7901 Long term (current) use of anticoagulants: Secondary | ICD-10-CM | POA: Diagnosis not present

## 2021-10-28 DIAGNOSIS — Z888 Allergy status to other drugs, medicaments and biological substances status: Secondary | ICD-10-CM

## 2021-10-28 DIAGNOSIS — K224 Dyskinesia of esophagus: Secondary | ICD-10-CM | POA: Diagnosis not present

## 2021-10-28 DIAGNOSIS — K580 Irritable bowel syndrome with diarrhea: Secondary | ICD-10-CM | POA: Diagnosis present

## 2021-10-28 DIAGNOSIS — I4891 Unspecified atrial fibrillation: Secondary | ICD-10-CM | POA: Diagnosis not present

## 2021-10-28 DIAGNOSIS — I700 Atherosclerosis of aorta: Secondary | ICD-10-CM | POA: Insufficient documentation

## 2021-10-28 DIAGNOSIS — R0989 Other specified symptoms and signs involving the circulatory and respiratory systems: Secondary | ICD-10-CM | POA: Diagnosis not present

## 2021-10-28 DIAGNOSIS — R000 Tachycardia, unspecified: Secondary | ICD-10-CM | POA: Diagnosis not present

## 2021-10-28 DIAGNOSIS — Z8673 Personal history of transient ischemic attack (TIA), and cerebral infarction without residual deficits: Secondary | ICD-10-CM | POA: Diagnosis not present

## 2021-10-28 DIAGNOSIS — K449 Diaphragmatic hernia without obstruction or gangrene: Secondary | ICD-10-CM | POA: Diagnosis not present

## 2021-10-28 DIAGNOSIS — I10 Essential (primary) hypertension: Secondary | ICD-10-CM | POA: Diagnosis not present

## 2021-10-28 DIAGNOSIS — Z79899 Other long term (current) drug therapy: Secondary | ICD-10-CM

## 2021-10-28 DIAGNOSIS — R42 Dizziness and giddiness: Secondary | ICD-10-CM | POA: Diagnosis not present

## 2021-10-28 DIAGNOSIS — D751 Secondary polycythemia: Secondary | ICD-10-CM | POA: Diagnosis not present

## 2021-10-28 DIAGNOSIS — Z91048 Other nonmedicinal substance allergy status: Secondary | ICD-10-CM | POA: Diagnosis not present

## 2021-10-28 DIAGNOSIS — K219 Gastro-esophageal reflux disease without esophagitis: Secondary | ICD-10-CM | POA: Diagnosis present

## 2021-10-28 DIAGNOSIS — Z8719 Personal history of other diseases of the digestive system: Secondary | ICD-10-CM

## 2021-10-28 DIAGNOSIS — K5902 Outlet dysfunction constipation: Secondary | ICD-10-CM

## 2021-10-28 DIAGNOSIS — R197 Diarrhea, unspecified: Secondary | ICD-10-CM | POA: Diagnosis not present

## 2021-10-28 DIAGNOSIS — Z801 Family history of malignant neoplasm of trachea, bronchus and lung: Secondary | ICD-10-CM

## 2021-10-28 DIAGNOSIS — R457 State of emotional shock and stress, unspecified: Secondary | ICD-10-CM | POA: Diagnosis not present

## 2021-10-28 DIAGNOSIS — I459 Conduction disorder, unspecified: Secondary | ICD-10-CM | POA: Diagnosis present

## 2021-10-28 DIAGNOSIS — R1310 Dysphagia, unspecified: Secondary | ICD-10-CM | POA: Diagnosis not present

## 2021-10-28 HISTORY — DX: Secondary polycythemia: D75.1

## 2021-10-28 LAB — CBC WITH DIFFERENTIAL/PLATELET
Abs Immature Granulocytes: 0.04 10*3/uL (ref 0.00–0.07)
Basophils Absolute: 0 10*3/uL (ref 0.0–0.1)
Basophils Relative: 0 %
Eosinophils Absolute: 0 10*3/uL (ref 0.0–0.5)
Eosinophils Relative: 0 %
HCT: 50.4 % (ref 39.0–52.0)
Hemoglobin: 16.3 g/dL (ref 13.0–17.0)
Immature Granulocytes: 1 %
Lymphocytes Relative: 19 %
Lymphs Abs: 1.3 10*3/uL (ref 0.7–4.0)
MCH: 28.8 pg (ref 26.0–34.0)
MCHC: 32.3 g/dL (ref 30.0–36.0)
MCV: 89.2 fL (ref 80.0–100.0)
Monocytes Absolute: 0.8 10*3/uL (ref 0.1–1.0)
Monocytes Relative: 12 %
Neutro Abs: 4.5 10*3/uL (ref 1.7–7.7)
Neutrophils Relative %: 68 %
Platelets: 196 10*3/uL (ref 150–400)
RBC: 5.65 MIL/uL (ref 4.22–5.81)
RDW: 14.1 % (ref 11.5–15.5)
WBC: 6.7 10*3/uL (ref 4.0–10.5)
nRBC: 0 % (ref 0.0–0.2)

## 2021-10-28 LAB — COMPREHENSIVE METABOLIC PANEL
ALT: 21 U/L (ref 0–44)
AST: 22 U/L (ref 15–41)
Albumin: 4.3 g/dL (ref 3.5–5.0)
Alkaline Phosphatase: 64 U/L (ref 38–126)
Anion gap: 15 (ref 5–15)
BUN: 17 mg/dL (ref 8–23)
CO2: 26 mmol/L (ref 22–32)
Calcium: 10.2 mg/dL (ref 8.9–10.3)
Chloride: 100 mmol/L (ref 98–111)
Creatinine, Ser: 1.25 mg/dL — ABNORMAL HIGH (ref 0.61–1.24)
GFR, Estimated: 60 mL/min — ABNORMAL LOW (ref >60)
Glucose, Bld: 108 mg/dL — ABNORMAL HIGH (ref 70–99)
Potassium: 2.9 mmol/L — ABNORMAL LOW (ref 3.5–5.1)
Sodium: 141 mmol/L (ref 135–145)
Total Bilirubin: 1.9 mg/dL — ABNORMAL HIGH (ref 0.3–1.2)
Total Protein: 7.4 g/dL (ref 6.5–8.1)

## 2021-10-28 LAB — BRAIN NATRIURETIC PEPTIDE: B Natriuretic Peptide: 116.2 pg/mL — ABNORMAL HIGH (ref 0.0–100.0)

## 2021-10-28 LAB — TROPONIN I (HIGH SENSITIVITY)
Troponin I (High Sensitivity): 15 ng/L (ref <18)
Troponin I (High Sensitivity): 18 ng/L — ABNORMAL HIGH (ref <18)

## 2021-10-28 LAB — URINALYSIS, ROUTINE W REFLEX MICROSCOPIC
Bilirubin Urine: NEGATIVE
Glucose, UA: NEGATIVE mg/dL
Hgb urine dipstick: NEGATIVE
Ketones, ur: NEGATIVE mg/dL
Leukocytes,Ua: NEGATIVE
Nitrite: NEGATIVE
Protein, ur: 30 mg/dL — AB
Specific Gravity, Urine: 1.008 (ref 1.005–1.030)
pH: 6.5 (ref 5.0–8.0)

## 2021-10-28 LAB — MAGNESIUM: Magnesium: 1.7 mg/dL (ref 1.7–2.4)

## 2021-10-28 MED ORDER — APIXABAN 5 MG PO TABS
5.0000 mg | ORAL_TABLET | Freq: Two times a day (BID) | ORAL | Status: DC
  Administered 2021-10-28 – 2021-11-01 (×8): 5 mg via ORAL
  Filled 2021-10-28 (×8): qty 1

## 2021-10-28 MED ORDER — ONDANSETRON HCL 4 MG PO TABS: 4.0000 mg | ORAL_TABLET | Freq: Four times a day (QID) | ORAL | Status: DC | PRN

## 2021-10-28 MED ORDER — PSYLLIUM 95 % PO PACK
1.0000 | PACK | Freq: Every day | ORAL | Status: DC | PRN
  Filled 2021-10-28: qty 1

## 2021-10-28 MED ORDER — METOPROLOL TARTRATE 25 MG PO TABS: 25.0000 mg | ORAL_TABLET | Freq: Two times a day (BID) | ORAL | Status: DC | PRN

## 2021-10-28 MED ORDER — HYDRALAZINE HCL 20 MG/ML IJ SOLN: 5.0000 mg | INTRAMUSCULAR | Status: DC | PRN

## 2021-10-28 MED ORDER — POTASSIUM CHLORIDE CRYS ER 20 MEQ PO TBCR
20.0000 meq | EXTENDED_RELEASE_TABLET | Freq: Two times a day (BID) | ORAL | Status: DC
  Administered 2021-10-31 – 2021-11-01 (×3): 20 meq via ORAL
  Filled 2021-10-28 (×3): qty 1

## 2021-10-28 MED ORDER — ACETAMINOPHEN 650 MG RE SUPP: 650.0000 mg | Freq: Four times a day (QID) | RECTAL | Status: DC | PRN

## 2021-10-28 MED ORDER — MAGNESIUM OXIDE -MG SUPPLEMENT 400 (240 MG) MG PO TABS
400.0000 mg | ORAL_TABLET | Freq: Two times a day (BID) | ORAL | Status: DC
  Administered 2021-10-28 – 2021-11-01 (×8): 400 mg via ORAL
  Filled 2021-10-28 (×8): qty 1

## 2021-10-28 MED ORDER — OXYCODONE-ACETAMINOPHEN 5-325 MG PO TABS
1.0000 | ORAL_TABLET | ORAL | Status: DC | PRN
  Administered 2021-10-29 – 2021-10-30 (×2): 1 via ORAL
  Filled 2021-10-28 (×3): qty 1

## 2021-10-28 MED ORDER — ONDANSETRON HCL 4 MG/2ML IJ SOLN: 4.0000 mg | Freq: Four times a day (QID) | INTRAMUSCULAR | Status: DC | PRN

## 2021-10-28 MED ORDER — FLEET ENEMA 7-19 GM/118ML RE ENEM: 1.0000 | ENEMA | Freq: Once | RECTAL | Status: DC

## 2021-10-28 MED ORDER — AMLODIPINE BESYLATE 10 MG PO TABS
10.0000 mg | ORAL_TABLET | Freq: Every day | ORAL | Status: DC
  Administered 2021-10-29: 10 mg via ORAL
  Filled 2021-10-28: qty 1

## 2021-10-28 MED ORDER — METOPROLOL TARTRATE 25 MG PO TABS
50.0000 mg | ORAL_TABLET | Freq: Once | ORAL | Status: AC
  Administered 2021-10-28: 50 mg via ORAL
  Filled 2021-10-28: qty 2

## 2021-10-28 MED ORDER — SODIUM CHLORIDE 0.9% FLUSH
3.0000 mL | Freq: Two times a day (BID) | INTRAVENOUS | Status: DC
  Administered 2021-10-29 – 2021-11-01 (×7): 3 mL via INTRAVENOUS

## 2021-10-28 MED ORDER — POLYETHYLENE GLYCOL 3350 17 G PO PACK
17.0000 g | PACK | Freq: Two times a day (BID) | ORAL | Status: DC | PRN
  Administered 2021-10-31: 17 g via ORAL
  Filled 2021-10-28: qty 1

## 2021-10-28 MED ORDER — MAGNESIUM SULFATE 2 GM/50ML IV SOLN
2.0000 g | Freq: Once | INTRAVENOUS | Status: AC
  Administered 2021-10-28: 2 g via INTRAVENOUS
  Filled 2021-10-28: qty 50

## 2021-10-28 MED ORDER — NICOTINE 14 MG/24HR TD PT24
14.0000 mg | MEDICATED_PATCH | Freq: Every day | TRANSDERMAL | Status: DC
  Filled 2021-10-28 (×3): qty 1

## 2021-10-28 MED ORDER — ACETAMINOPHEN 325 MG PO TABS
650.0000 mg | ORAL_TABLET | Freq: Four times a day (QID) | ORAL | Status: DC | PRN
  Administered 2021-10-28 – 2021-10-29 (×2): 650 mg via ORAL
  Filled 2021-10-28 (×2): qty 2

## 2021-10-28 MED ORDER — METOPROLOL TARTRATE 5 MG/5ML IV SOLN
10.0000 mg | Freq: Once | INTRAVENOUS | Status: AC
  Administered 2021-10-28: 10 mg via INTRAVENOUS
  Filled 2021-10-28: qty 10

## 2021-10-28 MED ORDER — POTASSIUM CHLORIDE CRYS ER 20 MEQ PO TBCR
40.0000 meq | EXTENDED_RELEASE_TABLET | Freq: Two times a day (BID) | ORAL | Status: AC
  Administered 2021-10-28 – 2021-10-30 (×4): 40 meq via ORAL
  Filled 2021-10-28 (×4): qty 2

## 2021-10-28 NOTE — ED Notes (Signed)
Report given to RN 4E at [facility]

## 2021-10-28 NOTE — Telephone Encounter (Signed)
Patient with diagnosis of afib on Eliquis for anticoagulation.    Procedure: EGD Date of procedure: TBD  CHA2DS2-VASc Score = 6  This indicates a 9.7% annual risk of stroke. The patient's score is based upon: CHF History: 0 HTN History: 1 Diabetes History: 0 Stroke History: 2 Vascular Disease History: 1 Age Score: 2 Gender Score: 0   CrCl 43mL/min using adjusted body weight Platelet count 199K  Per office protocol, patient can hold Eliquis for 1 day prior to procedure. He should resume as soon as safely possible after.

## 2021-10-28 NOTE — ED Notes (Signed)
[facility] at bedside 

## 2021-10-28 NOTE — Progress Notes (Signed)
Pt arrived to [facility] from [place]. Pt oriented to room and staff. Telemetry box applied and CCMD notified. CCMD states that they are unable to admit the pt at this time. Vitals obtained and stable. Pt updated on plan of care. CHG bath done.

## 2021-10-28 NOTE — Consult Note (Signed)
Cardiology Consultation:   Patient ID: Patrick Roth MRN: [id number]; DOB: 06-12-1946  Admit date: 10/28/2021 Date of Consult: 10/28/2021  PCP:  [name], PA-C   [facility] Cardiologist:  Mertie Moores, MD  Electrophysiologist:  Constance Haw, MD       Patient Profile:   Patrick Roth is a 75 y.o. male with a hx of atrial fibrillation with rapid ventricular response who is being seen 10/28/2021 for the evaluation of near syncope at the request of Dr. Lorin Mercy.  History of Present Illness:   Patrick Roth came into the emergency department on 12/7 with complaints of shortness of breath.  It had been going on for a few days.  He was seen in the ED the day before then discharged after he was felt to be stable.  After about 12 hours he started to get worse and he had some issues moving his bowels that made him feel anxious.  He felt some tachycardia according to the ER note with this.  After he is a bowel movement sometimes this improves his symptoms.  No chest pain no tightness no fever.  According to prior clinic note from Patrick Roth, [place] with EP team he has a history of syncope attributed to orthostasis in 2017.  Had a stroke in February 2022 while compliant with his oral anticoagulant, polycythemia.  Prior Zio patch monitor in November 2021 showed 100% A. fib.  He is intolerant of amiodarone-stopped it on his own because it was making him feel cold and he reported slow heart rates.  He also declined prior ablation strategy.  He was started on Toprol at 1 point and then spontaneously converted to sinus rhythm apparently, stated that the beta-blocker made him feel poor and he did not feel poorly with atrial fibrillation.  He was suspected that his symptoms that he was having were likely rapid ventricular response during episodes of dizziness and near syncope.  Antiarrhythmic therapy such as Tikosyn ablation or pacemaker for tachybradycardia with base sinus rates in  the 50s and 60s were discussed previously.  He was not thrilled about the idea.  According to the emergency department documentation his heart rates were mostly in the 140s but had a period of time where he had a wide-complex rhythm with a rate greater than 200.  He was in that for a couple minutes then it resolved.  Once again, feels like it is related to his balance.  When he was in the ER prior to this, it was discussed with cardiology and he did not feel like it warranted an admission.  His heart rate was in the low 100s after few hours.  No electrolyte abnormalities noted in ER note however his potassium was 2.9 this morning.  Troponin 18 flat.  White count normal.  Currently he is comfortable laying in bed.  He really would like to do something now about his symptoms.  I discussed with Patrick Bongo, MD as well.  His heart rate now is in the 90s to 100s.    Past Medical History:  Diagnosis Date   Atrial flutter ([place])    Colon polyps    Constipation 05/27/2015   DOE (dyspnea on exertion) 10/13/2015   Faintness 06/20/2016   History of chicken pox    Hyperlipidemia    Hypertension    Internal hemorrhoids    Malignant hypertension 07/15/2015   Sciatica    Stroke [facility]) 2008   Syncope and collapse 06/28/2016    Past  Surgical History:  Procedure Laterality Date   CARDIOVERSION N/A 09/02/2017   Procedure: CARDIOVERSION;  Surgeon: [name], MD;  Location: [facility] ENDOSCOPY;  Service: Cardiovascular;  Laterality: N/A;   COLONOSCOPY  2011   great toe surgery     PROSTATE BIOPSY       Home Medications:  Prior to Admission medications   Medication Sig Start Date End Date Taking? Authorizing Provider  acetaminophen (TYLENOL) 650 MG CR tablet Take 1,300 mg by mouth every 8 (eight) hours as needed for pain.   Yes [provider]  amLODipine (NORVASC) 10 MG tablet Take 10 mg by mouth daily.   Yes [provider]  apixaban (ELIQUIS) 5 MG TABS tablet Take 1 tablet (5  mg total) by mouth 2 (two) times daily. 06/16/21  Yes Camnitz, Will Hassell Done, MD  chlorthalidone (HYGROTON) 25 MG tablet Take 1 tablet (25 mg total) by mouth daily. 05/19/21  Yes Allwardt, Alyssa M, PA-C  Evolocumab (REPATHA SURECLICK) 101 MG/ML SOAJ Inject 1 pen into the skin every 14 (fourteen) days. 04/02/21  Yes Camnitz, Ocie Doyne, MD  Magnesium Oxide 400 MG CAPS Take 1 capsule (400 mg total) by mouth 2 (two) times daily. 75/10/25  Yes Delora Fuel, MD  metoprolol tartrate (LOPRESSOR) 25 MG tablet Take 1 tablet (25 mg total) by mouth 2 (two) times daily as needed (FOR HEARTRATE HIGHER THAN 120 bpm). 10/05/21  Yes Baldwin Jamaica, PA-C  oxyCODONE-acetaminophen (PERCOCET/ROXICET) 5-325 MG tablet Take 1 tablet by mouth every 4 (four) hours as needed for severe pain. 01/24/20  Yes Malvin Johns, MD  polyethylene glycol powder (GLYCOLAX/MIRALAX) 17 GM/SCOOP powder Take 17 g by mouth in the morning and at bedtime. Start with 1 scoop 2 times per day until bowels are moving, then reduce to 1 scoop daily. Drink 64oz of water daily. 09/18/21  Yes Hudnell, Colletta [place], NP  potassium chloride SA (KLOR-CON) 20 MEQ tablet TAKE 1 TABLET BY MOUTH IN THE MORNING AND TAKE 2 TABLETS IN THE EVENING 10/12/21  Yes Allwardt, Alyssa M, PA-C  psyllium (METAMUCIL) 58.6 % powder Take 1 packet by mouth daily as needed (constipation).   Yes [provider]    Inpatient Medications: Scheduled Meds:  sodium phosphate  1 enema Rectal Once   Continuous Infusions:  PRN Meds:   Allergies:    Allergies  Allergen Reactions   Crestor [Rosuvastatin Calcium] Shortness Of Breath    Muscle cramps   Spironolactone Shortness Of Breath   Terazosin Shortness Of Breath, Palpitations and Other (See Comments)    Nerve pain   Amiodarone Other (See Comments)    Dropped pulse real low to 45-50, and made him feel very cold   Atenolol Other (See Comments)    Drowsiness and "flu sxs"   Bystolic [Nebivolol Hcl] Other (See  Comments)    GI issues   Hydralazine Other (See Comments)    Joint swelling   Tizanidine Other (See Comments)    Syncope, Elevated BP/pulse   Amlodipine Other (See Comments)   Clonidine Other (See Comments)   Hydrocodone-Acetaminophen Other (See Comments)   Levofloxacin Other (See Comments)   Losartan Other (See Comments)    Did not work.  Pt states he couldn't take b/c of SE, but can't remember what SE were.   Maxzide [Hydrochlorothiazide W-Triamterene] Other (See Comments)    Does not tolerate potassium-sparing diuretics   Olmesartan Other (See Comments)   Tape Other (See Comments)    Other reaction(s): Unknown   Tussionex Pennkinetic Er [Hydrocod  Polst-Cpm Polst Er] Other (See Comments)    Other reaction(s): Unknown    Social History:   Social History   Socioeconomic History   Marital status: Married    Spouse name: [name]   Number of children: 1   Years of education: 12   Highest education level: Not on file  Occupational History   Occupation: Retired  Tobacco Use   Smoking status: Never   Smokeless tobacco: Never  Vaping Use   Vaping Use: Never used  Substance and Sexual Activity   Alcohol use: No   Drug use: No   Sexual activity: Not on file  Other Topics Concern   Not on file  Social History Narrative   Lives with wife   Right Handed   Drinks no caffeine   Social Determinants of Health   Financial Resource Strain: Low Risk    Difficulty of Paying Living Expenses: Not hard at all  Food Insecurity: No Food Insecurity   Worried About Charity fundraiser in the Last Year: Never true   Arboriculturist in the Last Year: Never true  Transportation Needs: No Transportation Needs   Lack of Transportation (Medical): No   Lack of Transportation (Non-Medical): No  Physical Activity: Inactive   Days of Exercise per Week: 0 days   Minutes of Exercise per Session: 0 min  Stress: No Stress Concern Present   Feeling of Stress : Not at all  Social Connections:  Moderately Isolated   Frequency of Communication with Friends and Family: More than three times a week   Frequency of Social Gatherings with Friends and Family: More than three times a week   Attends Religious Services: Never   Marine scientist or Organizations: No   Attends Music therapist: Never   Marital Status: Married  Human resources officer Violence: Not At Risk   Fear of Current or Ex-Partner: No   Emotionally Abused: No   Physically Abused: No   Sexually Abused: No    Family History:    Family History  Problem Relation Age of Onset   Hypertension Father 59       Deceased   Heart disease Father    Lung cancer Mother 28       Deceased   Healthy Sister        x3   Healthy Brother        x4   Heart disease Brother        #5   Other Daughter        Alpha Thalassemia   Lupus Daughter        #2-deceased   Diabetes Neg Hx    Heart attack Neg Hx    Hyperlipidemia Neg Hx    Sudden death Neg Hx      ROS:  Please see the history of present illness.   All other ROS reviewed and negative.     Physical Exam/Data:   Vitals:   10/28/21 1330 10/28/21 1345 10/28/21 1400 10/28/21 1536  BP:   (!) 123/104 (!) 117/94  Pulse: 92 (!) 101 (!) 102 100  Resp: 17 17 20 17   Temp:    98.1 F (36.7 C)  TempSrc:    Oral  SpO2: 99% 99% 96% 95%  Weight:    87.5 kg  Height:    5\' 8"  (1.727 m)   No intake or output data in the 24 hours ending 10/28/21 1604 Last 3 Weights 10/28/2021 10/27/2021 10/19/2021  Weight (lbs)  192 lb 12.8 oz 197 lb 197 lb  Weight (kg) 87.454 kg 89.359 kg 89.359 kg     Body mass index is 29.32 kg/m.  General:  Well nourished, well developed, in no acute distress HEENT: normal Neck: no JVD Vascular: No carotid bruits; Distal pulses 2+ bilaterally Cardiac:  normal S1, S2; irregularly irregular; no murmur  Lungs:  clear to auscultation bilaterally, no wheezing, rhonchi or rales  Abd: soft, nontender, no hepatomegaly  Ext: no  edema Musculoskeletal:  No deformities, BUE and BLE strength normal and equal Skin: warm and dry  Neuro:  CNs 2-12 intact, no focal abnormalities noted Psych:  Normal affect   EKG:  The EKG was personally reviewed and demonstrates:  His EKG on 10/28/2021 shows heart rate of 173 bpm, slightly irregularly irregular with right bundle branch block type morphology possibly atrial fibrillation with rapid ventricular response.  Left axis deviation also noted.  His EKG from 10:47 AM shows atrial fibrillation right bundle branch block left axis deviation with heart rate that is slower at 143   Telemetry:  Telemetry was personally reviewed and demonstrates: Atrial fibrillation with wide-complex QRS  Relevant CV Studies:  Nov 2021 Monitoring Max 215 bpm 02:28pm, 10/28 Min 82 bpm 11:16am, 10/28 Avg 112 bpm 3.3% PVC burden, rare supraventricular ectopy Atrial fibrillation/flutter occurred 100% of the time Longest ventricular run 10 seconds at 161 bpm No symptoms recorded       02/24/2018: stress myoview Nuclear stress EF: 58%. Normal perfusion. No ischemia This is a low risk study.       02/24/2018: TTE Study Conclusions  - Left ventricle: The cavity size was normal. Systolic function was    normal. The estimated ejection fraction was in the range of 55%    to 60%. Wall motion was normal; there were no regional wall    motion abnormalities. There was a reduced contribution of atrial    contraction to ventricular filling, due to increased ventricular    diastolic pressure or atrial contractile dysfunction. The study    is not technically sufficient to allow evaluation of LV diastolic    function. (suspect left atrial mechanical failure, possible    stunning after recent episode of atrial fibrillation versus    chronic atrial dysfunction)  - Left atrium: The atrium was mildly dilated.  - Right ventricle: The cavity size was mildly dilated. Wall    thickness was normal.  - Right atrium: The  atrium was mildly dilated.  - Pulmonary arteries: Systolic pressure was moderately increased.    PA peak pressure: 60 mm Hg (S).   Laboratory Data:  High Sensitivity Troponin:   Recent Labs  Lab 10/05/21 0442 10/27/21 1116 10/27/21 1315 10/28/21 1030 10/28/21 1215  TROPONINIHS 18* 13 16 15  18*     Chemistry Recent Labs  Lab 10/27/21 1116 10/28/21 1030  NA 138 141  K 3.1* 2.9*  CL 99 100  CO2 28 26  GLUCOSE 116* 108*  BUN 15 17  CREATININE 1.31* 1.25*  CALCIUM 9.1 10.2  MG 1.7 1.7  GFRNONAA 56* 60*  ANIONGAP 11 15    Recent Labs  Lab 10/27/21 1116 10/28/21 1030  PROT 7.2 7.4  ALBUMIN 3.7 4.3  AST 26 22  ALT 20 21  ALKPHOS 62 64  BILITOT 2.3* 1.9*   Lipids No results for input(s): CHOL, TRIG, HDL, LABVLDL, LDLCALC, CHOLHDL in the last 168 hours.  Hematology Recent Labs  Lab 10/27/21 1116 10/28/21 1030  WBC 6.2 6.7  RBC  5.69 5.65  HGB 16.7 16.3  HCT 50.2 50.4  MCV 88.2 89.2  MCH 29.3 28.8  MCHC 33.3 32.3  RDW 13.7 14.1  PLT 199 196   Thyroid No results for input(s): TSH, FREET4 in the last 168 hours.  BNP Recent Labs  Lab 10/27/21 1116 10/28/21 1030  BNP 133.7* 116.2*    DDimer No results for input(s): DDIMER in the last 168 hours.   Radiology/Studies:  DG Chest Port 1 View  Result Date: 10/28/2021 CLINICAL DATA:  Shortness of breath. EXAM: PORTABLE CHEST 1 VIEW COMPARISON:  Chest radiograph 10/27/2021 FINDINGS: The cardiomediastinal silhouette is within normal limits. Lung volumes are increased compared to the prior study. No airspace consolidation, edema, pleural effusion, or pneumothorax is identified. Telemetry leads overlie the chest. No acute osseous abnormality is seen. IMPRESSION: No active disease. Electronically Signed   By: Logan Bores M.D.   On: 10/28/2021 10:40   DG Chest Port 1 View  Result Date: 10/27/2021 CLINICAL DATA:  Shortness of breath EXAM: PORTABLE CHEST 1 VIEW COMPARISON:  10/04/2021 FINDINGS: The heart size and  mediastinal contours are within normal limits. Both lungs are clear. The visualized skeletal structures are unremarkable. IMPRESSION: No active disease. Electronically Signed   By: Elmer Picker M.D.   On: 10/27/2021 11:44     Assessment and Plan:   Atrial fibrillation with rapid ventricular response/near syncope - Has been an ongoing complex issue seen by electrophysiology with multiple discussions including discussions about antiarrhythmic therapy such as Tikosyn, ablation therapy, pacemaker therapy for tachybradycardia syndrome in the setting of right bundle branch block left anterior fascicular block and rapid ventricular response.  Prior electrophysiology notes reviewed. -She has had the sensation of near syncope in the past with presumed rapid ventricular response.  Bowel movement may be vagal trigger. -I will go ahead and ask EP service to once again to help address these issues.  He does sound as though he is receptive to possible therapy at this time.  Hypokalemia - Replete  For questions or updates, please contact [place] Please consult [email or web address] for contact info under    Signed, Candee Furbish, MD  10/28/2021 4:04 PM

## 2021-10-28 NOTE — Progress Notes (Signed)
Received a phone call from Facility: [facility]  Requesting MD: Tyrone Nine Patient with h/o atrial flutter; HLD; HTN and CVA presenting with SOB, diarrhea.  He was also seen in the ER yesterday for near syncope.  He has difficulty having a BM with SOB.  Yesterday had afib with RVR, back today with afib in 160s, up to 220s.  Dr. Claiborne Billings reviewed and recommended observation for wide complex tachycardia.  Given metoprolol with improvement.  Given Magnesium. Plan of care: Will observe in progressive care.  Needs cardiology consult upon arrival. The patient will be accepted for admission to progressive at [facility] when bed is available.    Nursing staff, Please call the [place] number at the top of Amion at the time of the patient's arrival so that the patient can be paged to the admitting physician.   Carlyon Shadow, M.D. [facility]

## 2021-10-28 NOTE — H&P (Signed)
History and Physical    Patrick Roth UUV:[id number] DOB: 12-31-1945 DOA: 10/28/2021  PCP: Patrick Lathe, PA-C Consultants:  Patrick Roth - cardiology; Patrick Roth - neurology; Patrick Roth - oncology Patient coming from:  Home - lives with wife; NOK: Wife, Patrick Roth, [phone]  Chief Complaint: near syncope  HPI: Patrick Roth is a 75 y.o. male with medical history significant of atrial flutter; HLD; HTN and CVA presenting with SOB, diarrhea.  He was also seen in the ER yesterday for near syncope.  He was seen for this issue in the ER and then cardiology clinic on 11/14 and was treated with K+ and Mag++ supplementation.  Zio patch was placed at that time and he was given Lopressor to take for HR >120.   The real problem in in his stomach and colon area.  When he has to use the bathroom, his face and body get tight feeling and he feels like he will pass out.  He isn't constipated but it is difficult for his sphincter to open.  Once it opens, it is fine.  This AM, it happened and his stools became loose.  He does not have these episodes without bowel movements.  He reports that this episode happened in the ER and was not associated with a BM but was because of his stomach then too.  His symptoms get worse every time.  He did have a pinched nerve and he thinks this also might be related; he had 2 nerve injections in his back and this did not improve the problem.  He also feels like he has something stuck in his throat and was planning to get a throat culture at the doctor yesterday; this has been going on for months.  It feels like there is a net there, in his lungs and throat.  However, he doesn't have dysphagia.      ED Course: [facility] transfer:  He has difficulty having a BM with SOB.  Yesterday had afib with RVR, back today with afib in 160s, up to 220s.  Patrick Roth reviewed and recommended observation for wide complex tachycardia.  Given metoprolol with improvement.  Given  Magnesium.   Review of Systems: As per HPI; otherwise review of systems reviewed and negative.   Ambulatory Status:  Ambulates without assistance  COVID Vaccine Status:   Complete plus booster  Past Medical History:  Diagnosis Date   Atrial flutter ([place])    Colon polyps    Constipation 05/27/2015   DOE (dyspnea on exertion) 10/13/2015   Faintness 06/20/2016   History of chicken pox    Hyperlipidemia    Hypertension    Internal hemorrhoids    Malignant hypertension 07/15/2015   Sciatica    Stroke ([place]) 2008   Syncope and collapse 06/28/2016    Past Surgical History:  Procedure Laterality Date   CARDIOVERSION N/A 09/02/2017   Procedure: CARDIOVERSION;  Surgeon: Patrick Casino, MD;  Location: [facility] ENDOSCOPY;  Service: Cardiovascular;  Laterality: N/A;   COLONOSCOPY  2011   great toe surgery     PROSTATE BIOPSY      Social History   Socioeconomic History   Marital status: Married    Spouse name: Patrick Roth   Number of children: 1   Years of education: 12   Highest education level: Not on file  Occupational History   Occupation: Retired  Tobacco Use   Smoking status: Never   Smokeless tobacco: Never  Vaping Use   Vaping Use: Never used  Substance and Sexual Activity   Alcohol use: No   Drug use: No   Sexual activity: Not on file  Other Topics Concern   Not on file  Social History Narrative   Lives with wife   Right Handed   Drinks no caffeine   Social Determinants of Health   Financial Resource Strain: Low Risk    Difficulty of Paying Living Expenses: Not hard at all  Food Insecurity: No Food Insecurity   Worried About Charity fundraiser in the Last Year: Never true   Ran Out of Food in the Last Year: Never true  Transportation Needs: No Transportation Needs   Lack of Transportation (Medical): No   Lack of Transportation (Non-Medical): No  Physical Activity: Inactive   Days of Exercise per Week: 0 days   Minutes of Exercise per Session: 0 min   Stress: No Stress Concern Present   Feeling of Stress : Not at all  Social Connections: Moderately Isolated   Frequency of Communication with Friends and Family: More than three times a week   Frequency of Social Gatherings with Friends and Family: More than three times a week   Attends Religious Services: Never   Marine scientist or Organizations: No   Attends Music therapist: Never   Marital Status: Married  Human resources officer Violence: Not At Risk   Fear of Current or Ex-Partner: No   Emotionally Abused: No   Physically Abused: No   Sexually Abused: No    Allergies  Allergen Reactions   Crestor [Rosuvastatin Calcium] Shortness Of Breath    Muscle cramps   Spironolactone Shortness Of Breath   Terazosin Shortness Of Breath, Palpitations and Other (See Comments)    Nerve pain   Amiodarone Other (See Comments)    Dropped pulse real low to 45-50, and made him feel very cold   Atenolol Other (See Comments)    Drowsiness and "flu sxs"   Bystolic [Nebivolol Hcl] Other (See Comments)    GI issues   Hydralazine Other (See Comments)    Joint swelling   Tizanidine Other (See Comments)    Syncope, Elevated BP/pulse   Amlodipine Other (See Comments)   Clonidine Other (See Comments)   Hydrocodone-Acetaminophen Other (See Comments)   Levofloxacin Other (See Comments)   Losartan Other (See Comments)    Did not work.  Pt states he couldn't take b/c of SE, but can't remember what SE were.   Maxzide [Hydrochlorothiazide W-Triamterene] Other (See Comments)    Does not tolerate potassium-sparing diuretics   Olmesartan Other (See Comments)   Tape Other (See Comments)    Other reaction(s): Unknown   Tussionex Pennkinetic Er [Hydrocod Polst-Cpm Polst Er] Other (See Comments)    Other reaction(s): Unknown    Family History  Problem Relation Age of Onset   Hypertension Father 59       Deceased   Heart disease Father    Lung cancer Mother 81       Deceased   Healthy  Sister        x3   Healthy Brother        x4   Heart disease Brother        #5   Other Daughter        Alpha Thalassemia   Lupus Daughter        #2-deceased   Diabetes Neg Hx    Heart attack Neg Hx    Hyperlipidemia Neg Hx    Sudden death  Neg Hx     Prior to Admission medications   Medication Sig Start Date End Date Taking? Authorizing Provider  acetaminophen (TYLENOL) 650 MG CR tablet Take 1,300 mg by mouth every 8 (eight) hours as needed for pain.   Yes [provider]  amLODipine (NORVASC) 10 MG tablet Take 10 mg by mouth daily.   Yes [provider]  apixaban (ELIQUIS) 5 MG TABS tablet Take 1 tablet (5 mg total) by mouth 2 (two) times daily. 06/16/21  Yes Camnitz, Will Hassell Done, MD  chlorthalidone (HYGROTON) 25 MG tablet Take 1 tablet (25 mg total) by mouth daily. 05/19/21  Yes Allwardt, Alyssa M, PA-C  Evolocumab (REPATHA SURECLICK) 240 MG/ML SOAJ Inject 1 pen into the skin every 14 (fourteen) days. 04/02/21  Yes Camnitz, Ocie Doyne, MD  Magnesium Oxide 400 MG CAPS Take 1 capsule (400 mg total) by mouth 2 (two) times daily. 97/35/32  Yes Delora Fuel, MD  metoprolol tartrate (LOPRESSOR) 25 MG tablet Take 1 tablet (25 mg total) by mouth 2 (two) times daily as needed (FOR HEARTRATE HIGHER THAN 120 bpm). 10/05/21  Yes Baldwin Jamaica, PA-C  oxyCODONE-acetaminophen (PERCOCET/ROXICET) 5-325 MG tablet Take 1 tablet by mouth every 4 (four) hours as needed for severe pain. 01/24/20  Yes Malvin Johns, MD  polyethylene glycol powder (GLYCOLAX/MIRALAX) 17 GM/SCOOP powder Take 17 g by mouth in the morning and at bedtime. Start with 1 scoop 2 times per day until bowels are moving, then reduce to 1 scoop daily. Drink 64oz of water daily. 09/18/21  Yes Hudnell, Colletta [place], NP  potassium chloride SA (KLOR-CON) 20 MEQ tablet TAKE 1 TABLET BY MOUTH IN THE MORNING AND TAKE 2 TABLETS IN THE EVENING 10/12/21  Yes Allwardt, Alyssa M, PA-C  psyllium (METAMUCIL) 58.6 % powder Take 1 packet by  mouth daily as needed (constipation).   Yes [provider]    Physical Exam: Vitals:   10/28/21 1330 10/28/21 1345 10/28/21 1400 10/28/21 1536  BP:   (!) 123/104 (!) 117/94  Pulse: 92 (!) 101 (!) 102 100  Resp: 17 17 20 17   Temp:    98.1 F (36.7 C)  TempSrc:    Oral  SpO2: 99% 99% 96% 95%  Weight:    87.5 kg  Height:    5\' 8"  (1.727 m)     General:  Appears calm and comfortable and is in NAD Eyes:  EOMI, normal lids, iris ENT:  grossly normal hearing, lips & tongue, mmm; appropriate dentition Neck:  no LAD, masses or thyromegaly Cardiovascular:  RR with mild tachycardia, no m/r/g. No LE edema.  Respiratory:   CTA bilaterally with no wheezes/rales/rhonchi.  Normal respiratory effort. Abdomen:  soft, NT, ND Skin:  no rash or induration seen on limited exam Musculoskeletal:  grossly normal tone BUE/BLE, good ROM, no bony abnormality Psychiatric:  grossly normal mood and affect, speech fluent and appropriate, AOx3 Neurologic:  CN 2-12 grossly intact, moves all extremities in coordinated fashion    Radiological Exams on Admission: Independently reviewed - see discussion in A/P where applicable  DG Chest Port 1 View  Result Date: 10/28/2021 CLINICAL DATA:  Shortness of breath. EXAM: PORTABLE CHEST 1 VIEW COMPARISON:  Chest radiograph 10/27/2021 FINDINGS: The cardiomediastinal silhouette is within normal limits. Lung volumes are increased compared to the prior study. No airspace consolidation, edema, pleural effusion, or pneumothorax is identified. Telemetry leads overlie the chest. No acute osseous abnormality is seen. IMPRESSION: No active disease. Electronically Signed   By: Logan Bores  M.D.   On: 10/28/2021 10:40   DG Chest Port 1 View  Result Date: 10/27/2021 CLINICAL DATA:  Shortness of breath EXAM: PORTABLE CHEST 1 VIEW COMPARISON:  10/04/2021 FINDINGS: The heart size and mediastinal contours are within normal limits. Both lungs are clear. The visualized skeletal  structures are unremarkable. IMPRESSION: No active disease. Electronically Signed   By: Elmer Picker M.D.   On: 10/27/2021 11:44    EKG: Independently reviewed.   80 - Aflutter with rate 143; RBBB; NSCSLT 1017 - Aflutter with extreme tachycardia to 173, wide complex   Labs on Admission: I have personally reviewed the available labs and imaging studies at the time of the admission.  Pertinent labs:   K+ 2.9 BUN 17/Creatinine 1.25/GFR 60 BNP 116.2 HS troponin 15, 18 Normal CBC UA 30 protein   Assessment/Plan Principal Problem:   Atrial flutter with rapid ventricular response (HCC) Active Problems:   Essential hypertension   Mixed hyperlipidemia   Wide-complex tachycardia   Rectal sphincter spasm   Aflutter with RVR/wide complex tachycardia -Patient presenting with long-standing afib/flutter. -In the ER, had a run of RVR to over 200 with apparent wide complex tachycardia  -He has been followed by cardiology for this for a long time and has been offered pacemaker vs. Ablation vs. Medication therapy and the patient has declined -He does not need Diltiazem drip since the rate lower with metoprolol; will continue PO metoprolol for now -He has been seen by cardiology and will be seen by EP tomorrow to discuss his options further -Will plan to place in observation status in progressive care for now -HS troponin negative x 2 -Continue Eliquis. -Hold Chlorthalidone as this may be contributing to electrolyte dysfunction and worsening his situation -Continue Mag Oxide  Rectal sphincter dysfunction -He feels very certain that his RVR is being stimulated by rectal sphincter dysfunction causing vagal tone increase (my interpretation based on his description) -If he is right, he may be having rectal sphincter spasms -GI consulted for evaluation (tomorrow) -He also complains of a globus sensation without dysphagia -Continue Miralax and psyllium, although he reports that he no  longer has issues with constipation thanks to these medications  HTN -Continue Norvasc -Hold Chlorthalidone as this may be contributing to electrolyte dysfunction and worsening his situation -Will also add prn hydralazine  HLD -Continue repatha     Note: This patient was positive for COVID-19 on 10/23 and 11/9 and so does not require retesting at this time. He has been fully vaccinated against COVID-19.   Level of care: Progressive  DVT prophylaxis: Eliquis Code Status:  DNI - confirmed with patient Family Communication: None present; he did not want me to call his wife at the time of admission Disposition Plan:  The patient is from: home  Anticipated d/c is to: home without [facility] services  Anticipated d/c date will depend on clinical response to treatment, but possibly as early as tomorrow if he has excellent response to treatment  Patient is currently: acutely ill Consults called: Cardiology; GI  Admission status: It is my clinical opinion that referral for OBSERVATION is reasonable and necessary in this patient based on the above information provided. The aforementioned taken together are felt to place the patient at high risk for further clinical deterioration. However it is anticipated that the patient may be medically stable for discharge from the hospital within 24 to 48 hours.     Karmen Bongo MD [facility]   How to contact the [facility] Attending or  Consulting provider [place] or covering provider during after hours [place], for this patient?  Check the care team in [facility] and look for a) attending/consulting [facility] provider listed and b) the [facility] team listed Log into [email or web address] and use [place]'s universal password to access. If you do not have the password, please contact the hospital operator. Locate the [facility] provider you are looking for under [facility] and page to a number that you can be directly reached. If you still have difficulty reaching the provider,  please page the [facility] (Director on Call) for the Hospitalists listed on amion for assistance.   10/28/2021, 6:02 PM

## 2021-10-28 NOTE — ED Triage Notes (Signed)
Present today with shortness of breath; abdominal discomfort associated with some diarrhea; and numbness going down right leg.  Denies chest pain.

## 2021-10-28 NOTE — ED Provider Notes (Signed)
[facility] EMERGENCY DEPT Provider Note   CSN: [id number] Arrival date & time: 10/28/21  0957     History Chief Complaint  Patient presents with   Shortness of Breath    Patrick Roth is a 75 y.o. male.  75 yo M with a chief complaints of shortness of breath.  This is been going on for a few days now.  He was actually seen in the ED yesterday and was discharged home.  He thinks it got worse last night about 12 hours ago.  He tells me that he has had some issues with moving his bowels and what ever he has that problem that makes him feel a bit anxious that he tends to have some tachycardia.  He is not sure what tends to make this better but then later tells me that after he has a bowel movement sometimes that improves his symptoms.  He did have a bowel movement this morning that he felt was reasonable but did not improve his symptoms and so came back to the ED this morning.  He denies cough congestion or fever denies chest pain but does feel like he has some tightness there.   The history is provided by the patient.  Shortness of Breath Severity:  Moderate Onset quality:  Gradual Duration:  2 days Timing:  Intermittent Progression:  Waxing and waning Chronicity:  Recurrent Relieved by:  Nothing Worsened by:  Nothing Ineffective treatments:  None tried Associated symptoms: chest pain   Associated symptoms: no abdominal pain, no fever, no headaches, no rash and no vomiting       Past Medical History:  Diagnosis Date   Atrial flutter ([place])    Colon polyps    Constipation 05/27/2015   DOE (dyspnea on exertion) 10/13/2015   Faintness 06/20/2016   History of chicken pox    Hyperlipidemia    Hypertension    Internal hemorrhoids    Malignant hypertension 07/15/2015   Sciatica    Stroke ([place]) 2008   Syncope and collapse 06/28/2016    Patient Active Problem List   Diagnosis Date Noted   Aortic atherosclerosis ([place]) 10/28/2021   Wide-complex tachycardia  10/28/2021   Paroxysmal atrial fibrillation ([place]) 05/26/2021   Polycythemia 05/26/2021   Statin myopathy 01/29/2021   Acute arterial ischemic stroke, multifocal, anterior circulation, left ([place]) 01/16/2021   Secondary hypercoagulable state ([place]) 09/11/2020   Cyst of perineum in male 11/28/2018   Mixed hyperlipidemia    History of chicken pox    Colon polyps    Atrial flutter with rapid ventricular response ([place])    Other intervertebral disc degeneration, lumbar region 05/09/2017   Lumbar radiculopathy 03/29/2017   Persistent atrial fibrillation ([place]) 09/14/2016   Diuretic-induced hypokalemia 06/28/2016   Essential hypertension 05/27/2015   Constipation due to pain medication 05/27/2015   Mallet deformity of fourth finger, right 07/11/2013   Stroke ([place]) 08/23/2012    Past Surgical History:  Procedure Laterality Date   CARDIOVERSION N/A 09/02/2017   Procedure: CARDIOVERSION;  Surgeon: [name], MD;  Location: [facility] ENDOSCOPY;  Service: Cardiovascular;  Laterality: N/A;   COLONOSCOPY  2011   great toe surgery     PROSTATE BIOPSY         Family History  Problem Relation Age of Onset   Hypertension Father 44       Deceased   Heart disease Father    Lung cancer Mother 56       Deceased   Healthy Sister  x3   Healthy Brother        x4   Heart disease Brother        #5   Other Daughter        Alpha Thalassemia   Lupus Daughter        #2-deceased   Diabetes Neg Hx    Heart attack Neg Hx    Hyperlipidemia Neg Hx    Sudden death Neg Hx     Social History   Tobacco Use   Smoking status: Never   Smokeless tobacco: Never  Vaping Use   Vaping Use: Never used  Substance Use Topics   Alcohol use: No   Drug use: No    Home Medications Prior to Admission medications   Medication Sig Start Date End Date Taking? Authorizing Provider  acetaminophen (TYLENOL) 650 MG CR tablet Take 1,300 mg by mouth every 8 (eight) hours as needed for pain.   Yes [provider]  amLODipine (NORVASC) 10 MG tablet Take 10 mg by mouth daily.   Yes [provider]  apixaban (ELIQUIS) 5 MG TABS tablet Take 1 tablet (5 mg total) by mouth 2 (two) times daily. 06/16/21  Yes Camnitz, Will Hassell Done, MD  chlorthalidone (HYGROTON) 25 MG tablet Take 1 tablet (25 mg total) by mouth daily. 05/19/21  Yes Allwardt, Alyssa M, PA-C  Evolocumab (REPATHA SURECLICK) 712 MG/ML SOAJ Inject 1 pen into the skin every 14 (fourteen) days. 04/02/21  Yes Camnitz, Ocie Doyne, MD  Magnesium Oxide 400 MG CAPS Take 1 capsule (400 mg total) by mouth 2 (two) times daily. 45/80/99  Yes Delora Fuel, MD  metoprolol tartrate (LOPRESSOR) 25 MG tablet Take 1 tablet (25 mg total) by mouth 2 (two) times daily as needed (FOR HEARTRATE HIGHER THAN 120 bpm). 10/05/21  Yes Baldwin [place], PA-C  oxyCODONE-acetaminophen (PERCOCET/ROXICET) 5-325 MG tablet Take 1 tablet by mouth every 4 (four) hours as needed for severe pain. 01/24/20  Yes Malvin Johns, MD  polyethylene glycol powder (GLYCOLAX/MIRALAX) 17 GM/SCOOP powder Take 17 g by mouth in the morning and at bedtime. Start with 1 scoop 2 times per day until bowels are moving, then reduce to 1 scoop daily. Drink 64oz of water daily. 09/18/21  Yes Hudnell, Colletta [place], NP  potassium chloride SA (KLOR-CON) 20 MEQ tablet TAKE 1 TABLET BY MOUTH IN THE MORNING AND TAKE 2 TABLETS IN THE EVENING 10/12/21  Yes Allwardt, Alyssa M, PA-C  psyllium (METAMUCIL) 58.6 % powder Take 1 packet by mouth daily as needed (constipation).   Yes [provider]    Allergies    Crestor [rosuvastatin calcium], Spironolactone, Terazosin, Amiodarone, Atenolol, Bystolic [nebivolol hcl], Hydralazine, Tizanidine, Amlodipine, Clonidine, Hydrocodone-acetaminophen, Levofloxacin, Losartan, Maxzide [hydrochlorothiazide w-triamterene], Olmesartan, Tape, and Tussionex pennkinetic er [hydrocod polst-cpm polst er]  Review of Systems   Review of Systems  Constitutional:  Negative  for chills and fever.  HENT:  Negative for congestion and facial swelling.   Eyes:  Negative for discharge and visual disturbance.  Respiratory:  Positive for shortness of breath.   Cardiovascular:  Positive for chest pain. Negative for palpitations.  Gastrointestinal:  Positive for constipation. Negative for abdominal pain, diarrhea and vomiting.  Musculoskeletal:  Negative for arthralgias and myalgias.  Skin:  Negative for color change and rash.  Neurological:  Negative for tremors, syncope and headaches.  Psychiatric/Behavioral:  Negative for confusion and dysphoric mood.    Physical Exam Updated Vital Signs BP 108/90 (BP Location: Left Arm)   Pulse 99  Temp 98.1 F (36.7 C) (Oral)   Resp 20   SpO2 100%   Physical Exam Vitals and nursing note reviewed.  Constitutional:      Appearance: He is well-developed.  HENT:     Head: Normocephalic and atraumatic.  Eyes:     Pupils: Pupils are equal, round, and reactive to light.  Neck:     Vascular: No JVD.  Cardiovascular:     Rate and Rhythm: Tachycardia present. Rhythm irregular.     Heart sounds: No murmur heard.   No friction rub. No gallop.  Pulmonary:     Effort: No tachypnea or respiratory distress.     Breath sounds: No wheezing.  Abdominal:     General: There is no distension.     Tenderness: There is no abdominal tenderness. There is no guarding or rebound.  Musculoskeletal:        General: Normal range of motion.     Cervical back: Normal range of motion and neck supple.     Right lower leg: Edema present.     Left lower leg: Edema present.     Comments: Trace edema to bilateral lower extremities  Skin:    Coloration: Skin is not pale.     Findings: No rash.  Neurological:     Mental Status: He is alert and oriented to person, place, and time.  Psychiatric:        Behavior: Behavior normal.    ED Results / Procedures / Treatments   Labs (all labs ordered are listed, but only abnormal results are  displayed) Labs Reviewed  COMPREHENSIVE METABOLIC PANEL - Abnormal; Notable for the following components:      Result Value   Potassium 2.9 (*)    Glucose, Bld 108 (*)    Creatinine, Ser 1.25 (*)    Total Bilirubin 1.9 (*)    GFR, Estimated 60 (*)    All other components within normal limits  BRAIN NATRIURETIC PEPTIDE - Abnormal; Notable for the following components:   B Natriuretic Peptide 116.2 (*)    All other components within normal limits  URINALYSIS, ROUTINE W REFLEX MICROSCOPIC - Abnormal; Notable for the following components:   Protein, ur 30 (*)    Bacteria, UA RARE (*)    All other components within normal limits  CBC WITH DIFFERENTIAL/PLATELET  MAGNESIUM  TROPONIN I (HIGH SENSITIVITY)  TROPONIN I (HIGH SENSITIVITY)    EKG EKG Interpretation  Date/Time:  Wednesday October 28 2021 10:17:45 EST Ventricular Rate:  173 PR Interval:    QRS Duration: 150 QT Interval:  288 QTC Calculation: 489 R Axis:   -89 Text Interpretation: Extreme tachycardia with wide complex, no further rhythm analysis attempted Otherwise no significant change Confirmed by Deno Etienne [phone]) on 10/28/2021 10:48:07 AM  Radiology DG Chest Port 1 View  Result Date: 10/28/2021 CLINICAL DATA:  Shortness of breath. EXAM: PORTABLE CHEST 1 VIEW COMPARISON:  Chest radiograph 10/27/2021 FINDINGS: The cardiomediastinal silhouette is within normal limits. Lung volumes are increased compared to the prior study. No airspace consolidation, edema, pleural effusion, or pneumothorax is identified. Telemetry leads overlie the chest. No acute osseous abnormality is seen. IMPRESSION: No active disease. Electronically Signed   By: Logan Bores M.D.   On: 10/28/2021 10:40   DG Chest Port 1 View  Result Date: 10/27/2021 CLINICAL DATA:  Shortness of breath EXAM: PORTABLE CHEST 1 VIEW COMPARISON:  10/04/2021 FINDINGS: The heart size and mediastinal contours are within normal limits. Both lungs are clear. The visualized  skeletal structures are unremarkable. IMPRESSION: No active disease. Electronically Signed   By: Elmer Picker M.D.   On: 10/27/2021 11:44    Procedures Procedures   Medications Ordered in ED Medications  sodium phosphate (FLEET) 7-19 GM/118ML enema 1 enema (has no administration in time range)  metoprolol tartrate (LOPRESSOR) injection 10 mg (10 mg Intravenous Given 10/28/21 1031)  magnesium sulfate IVPB 2 g 50 mL (0 g Intravenous Stopped 10/28/21 1100)  metoprolol tartrate (LOPRESSOR) tablet 50 mg (50 mg Oral Given 10/28/21 1111)    ED Course  I have reviewed the triage vital signs and the nursing notes.  Pertinent labs & imaging results that were available during my care of the patient were reviewed by me and considered in my medical decision making (see chart for details).    MDM Rules/Calculators/A&P                           75 yo M with a significant past medical history of atrial fibrillation comes in with a chief complaints of death.  Patient with rates mostly in the 140s but had a period where he went into a wide-complex rapid rhythm with a rate greater than 200.  Was in that for for couple minutes and then resolved.  He feels like its related to his bowels.  He was actually seen here yesterday and the case was discussed with cardiology and felt not to need admission and was discharged home.  At this point with rate sometimes going into the 200s will likely warrants admission.  Patient is somewhat adamant that it is related to his bowels and so will try a enema if able.  Pads placed.  We will give a bolus dose of magnesium bolus dose of metoprolol.  Patient's heart rate is improved significantly.  Now in the low 100s consistently.  No significant electrolyte abnormality.  Troponin similar to yesterday.  Chest x-ray without obvious focal infiltrate or edema.  Discussed case with Dr. Claiborne Billings, cardiology.  Recommended medical admission.  Discussed with medicine.  CRITICAL  CARE Performed by: Cecilio Asper   Total critical care time: 35 minutes  Critical care time was exclusive of separately billable procedures and treating other patients.  Critical care was necessary to treat or prevent imminent or life-threatening deterioration.  Critical care was time spent personally by me on the following activities: development of treatment plan with patient and/or surrogate as well as nursing, discussions with consultants, evaluation of patient's response to treatment, examination of patient, obtaining history from patient or surrogate, ordering and performing treatments and interventions, ordering and review of laboratory studies, ordering and review of radiographic studies, pulse oximetry and re-evaluation of patient's condition.  The patients results and plan were reviewed and discussed.   Any x-rays performed were independently reviewed by myself.   Differential diagnosis were considered with the presenting HPI.  Medications  sodium phosphate (FLEET) 7-19 GM/118ML enema 1 enema (has no administration in time range)  metoprolol tartrate (LOPRESSOR) injection 10 mg (10 mg Intravenous Given 10/28/21 1031)  magnesium sulfate IVPB 2 g 50 mL (0 g Intravenous Stopped 10/28/21 1100)  metoprolol tartrate (LOPRESSOR) tablet 50 mg (50 mg Oral Given 10/28/21 1111)    Vitals:   10/28/21 1045 10/28/21 1100 10/28/21 1111 10/28/21 1154  BP: 116/82 92/72 99/87  108/90  Pulse: (!) 105 94 (!) 102 99  Resp: 19 19  20   Temp:      TempSrc:  SpO2: 97% 100%  100%    Final diagnoses:  Atrial fibrillation with rapid ventricular response (HCC)    Admission/ observation were discussed with the admitting physician, patient and/or family and they are comfortable with the plan.   Final Clinical Impression(s) / ED Diagnoses Final diagnoses:  Atrial fibrillation with rapid ventricular response [facility])    Rx / DC Orders ED Discharge Orders     None        Deno Etienne,  DO 10/28/21 1251

## 2021-10-28 NOTE — ED Notes (Signed)
Phone Handoff Report provided to [facility] Transport Team 

## 2021-10-28 NOTE — ED Notes (Signed)
ED Provider at bedside. 

## 2021-10-28 NOTE — ED Notes (Signed)
Attempt to call report.  No answer °

## 2021-10-28 NOTE — ED Notes (Signed)
Attempt to call report States will call back

## 2021-10-29 ENCOUNTER — Other Ambulatory Visit (HOSPITAL_COMMUNITY): Payer: Self-pay

## 2021-10-29 DIAGNOSIS — I4891 Unspecified atrial fibrillation: Secondary | ICD-10-CM

## 2021-10-29 DIAGNOSIS — K5902 Outlet dysfunction constipation: Secondary | ICD-10-CM

## 2021-10-29 DIAGNOSIS — R0989 Other specified symptoms and signs involving the circulatory and respiratory systems: Secondary | ICD-10-CM

## 2021-10-29 DIAGNOSIS — K594 Anal spasm: Secondary | ICD-10-CM

## 2021-10-29 DIAGNOSIS — I4892 Unspecified atrial flutter: Secondary | ICD-10-CM | POA: Diagnosis not present

## 2021-10-29 LAB — CBC
HCT: 47.7 % (ref 39.0–52.0)
Hemoglobin: 15.6 g/dL (ref 13.0–17.0)
MCH: 29.1 pg (ref 26.0–34.0)
MCHC: 32.7 g/dL (ref 30.0–36.0)
MCV: 89 fL (ref 80.0–100.0)
Platelets: 203 10*3/uL (ref 150–400)
RBC: 5.36 MIL/uL (ref 4.22–5.81)
RDW: 14 % (ref 11.5–15.5)
WBC: 7 10*3/uL (ref 4.0–10.5)
nRBC: 0 % (ref 0.0–0.2)

## 2021-10-29 LAB — BASIC METABOLIC PANEL
Anion gap: 7 (ref 5–15)
BUN: 12 mg/dL (ref 8–23)
CO2: 27 mmol/L (ref 22–32)
Calcium: 8.9 mg/dL (ref 8.9–10.3)
Chloride: 100 mmol/L (ref 98–111)
Creatinine, Ser: 1.15 mg/dL (ref 0.61–1.24)
GFR, Estimated: >60 mL/min (ref >60)
Glucose, Bld: 93 mg/dL (ref 70–99)
Potassium: 3.5 mmol/L (ref 3.5–5.1)
Sodium: 134 mmol/L — ABNORMAL LOW (ref 135–145)

## 2021-10-29 MED ORDER — FAMOTIDINE 20 MG PO TABS
20.0000 mg | ORAL_TABLET | Freq: Every day | ORAL | Status: DC
  Administered 2021-10-29 – 2021-11-01 (×4): 20 mg via ORAL
  Filled 2021-10-29 (×4): qty 1

## 2021-10-29 MED ORDER — DILTIAZEM HCL 60 MG PO TABS
60.0000 mg | ORAL_TABLET | Freq: Four times a day (QID) | ORAL | Status: DC
  Administered 2021-10-29 – 2021-10-30 (×5): 60 mg via ORAL
  Filled 2021-10-29 (×5): qty 1

## 2021-10-29 NOTE — Consult Note (Addendum)
Cardiology Consultation:   Patient ID: Patrick Roth MRN: [id number]; DOB: November 27, 1945  Admit date: 10/28/2021 Date of Consult: 10/29/2021  PCP:  [name], PA-C   [facility] Providers Cardiologist:  Mertie Moores, MD  Electrophysiologist:  Constance Haw, MD       Patient Profile:   Patrick Roth is a 75 y.o. male with a hx of  AFib, HTN, CVA,  HLD, syncope attributed to orthostasis in 2017, h/o medication non complinace, another stroke Feb 2022 (compliant with his [place] at the time)  >> polycythemia who is being seen 10/29/2021 for the evaluation of Afib w/RVR at the request of Dr. Marlou Porch.   AFib Hx Diagnosed 2017   AAD hx Started March 2019 >> seems self stopped sometime in the Fall 2021 2/2 making him feel cold and lowered his pulse Toprol was stopped by the patient making him feel bad, poorly tolerated     History of Present Illness:   Patrick Roth last saw Dr. [name] June 2022, following with heme for his polycythemia, continued [place], no changes were made.   He had an ER visit Sep 13, 2021, fever/chills, URI symptoms, COVID + Another ER visit 09/30/21 for recurrent dizziness, near syncope.  He was suspect to have a vagal episode associate with using the bathroom, though was found in Afib that was fast.  He was given IVF and lopressor with reported improved HR discharged from the ER NOTED there that metoprolol on his med list though pt was unfamiliar with the medicine, ? Not taking Back in the ER 10/04/21 Woke up as usual walked into the kitchen and started to feel weak, lightheaded and poorly again. This seems to always to some degree associated with some abdominal symptoms (he is struggling with constipation of sorts lately), he sat down and did feel a little better but when standing up again felt poorly. He did not have a sense of his Afib or racing but something "off" with his heart beat. Yesterday his HR was reported > 200, one of his EKG noted with rapid Afib  236bpm and was cardioverted (2 shocks required His last EKG yesterday was SB 54bpm   10/04/21 evening he was getting ready for bed and started feeling lightheaded again, He sat down and did feel better, but had an uneasy sense and his wife called EMS EMS tracings SR, PACs, one strip had a couple single PVCs, rates 70's EKG at the ER SR 61bpm   I saw him 10/05/21, unclear that with his baseline conductions sytem disease leary of rate controlling drigs, unclear is his symptoms of near syncope/weakness were RVR (likely) though planned for monitoring to look for bradycardia as well. We discussed Tikosyn, PVI ablation and pace/ablate strategies, he wanted to think about it and follow up.  He had an ER visit 12/6, with near syncope associated with straining to have a BM, his HR went up and he took a couple doses of PRN lopressor went to the ER he was in AFib 120s, his HR settled to 90's-110's, he was feeling well and sent home He came back yesterday this time though it seems for diarrhea, not near syncope He was in AFib w/RVR and rates getting to 200's, he got lopressor in the ER with improvement in his HRs, admitted for further management  He wore a ZIo monitor recently without bradycardia  LABS K+ 3.5 BUN/Creat 12/1.15 HS Trop 15, 18 WBC 7.0 H/H 15/47 Plts 203  He almost every time finds  a correlation with difficulty with his BMs, abd cramping to racing heart rates and dizzy spells.   Past Medical History:  Diagnosis Date   Atrial flutter ([place])    Colon polyps    Constipation 05/27/2015   DOE (dyspnea on exertion) 10/13/2015   Faintness 06/20/2016   History of chicken pox    Hyperlipidemia    Hypertension    Internal hemorrhoids    Malignant hypertension 07/15/2015   Sciatica    Stroke [facility]) 2008   Syncope and collapse 06/28/2016    Past Surgical History:  Procedure Laterality Date   CARDIOVERSION N/A 09/02/2017   Procedure: CARDIOVERSION;  Surgeon: [name], MD;   Location: [facility] ENDOSCOPY;  Service: Cardiovascular;  Laterality: N/A;   COLONOSCOPY  2011   great toe surgery     PROSTATE BIOPSY       Home Medications:  Prior to Admission medications   Medication Sig Start Date End Date Taking? Authorizing Provider  acetaminophen (TYLENOL) 650 MG CR tablet Take 1,300 mg by mouth every 8 (eight) hours as needed for pain.   Yes [provider]  amLODipine (NORVASC) 10 MG tablet Take 10 mg by mouth daily.   Yes [provider]  apixaban (ELIQUIS) 5 MG TABS tablet Take 1 tablet (5 mg total) by mouth 2 (two) times daily. 06/16/21  Yes Camnitz, Will Hassell Done, MD  chlorthalidone (HYGROTON) 25 MG tablet Take 1 tablet (25 mg total) by mouth daily. 05/19/21  Yes Allwardt, Alyssa M, PA-C  Evolocumab (REPATHA SURECLICK) 102 MG/ML SOAJ Inject 1 pen into the skin every 14 (fourteen) days. 04/02/21  Yes Camnitz, Ocie Doyne, MD  Magnesium Oxide 400 MG CAPS Take 1 capsule (400 mg total) by mouth 2 (two) times daily. 58/52/77  Yes Delora Fuel, MD  metoprolol tartrate (LOPRESSOR) 25 MG tablet Take 1 tablet (25 mg total) by mouth 2 (two) times daily as needed (FOR HEARTRATE HIGHER THAN 120 bpm). 10/05/21  Yes Baldwin Jamaica, PA-C  oxyCODONE-acetaminophen (PERCOCET/ROXICET) 5-325 MG tablet Take 1 tablet by mouth every 4 (four) hours as needed for severe pain. 01/24/20  Yes Malvin Johns, MD  polyethylene glycol powder (GLYCOLAX/MIRALAX) 17 GM/SCOOP powder Take 17 g by mouth in the morning and at bedtime. Start with 1 scoop 2 times per day until bowels are moving, then reduce to 1 scoop daily. Drink 64oz of water daily. 09/18/21  Yes Hudnell, Colletta [place], NP  potassium chloride SA (KLOR-CON) 20 MEQ tablet TAKE 1 TABLET BY MOUTH IN THE MORNING AND TAKE 2 TABLETS IN THE EVENING 10/12/21  Yes Allwardt, Alyssa M, PA-C  psyllium (METAMUCIL) 58.6 % powder Take 1 packet by mouth daily as needed (constipation).   Yes [provider]    Inpatient  Medications: Scheduled Meds:  amLODipine  10 mg Oral Daily   apixaban  5 mg Oral BID   magnesium oxide  400 mg Oral BID   nicotine  14 mg Transdermal Daily   [START ON 10/31/2021] potassium chloride SA  20 mEq Oral BID PC   potassium chloride  40 mEq Oral BID   sodium chloride flush  3 mL Intravenous Q12H   sodium phosphate  1 enema Rectal Once   Continuous Infusions:  PRN Meds: acetaminophen **OR** acetaminophen, hydrALAZINE, metoprolol tartrate, ondansetron **OR** ondansetron (ZOFRAN) IV, oxyCODONE-acetaminophen, polyethylene glycol, psyllium  Allergies:    Allergies  Allergen Reactions   Crestor [Rosuvastatin Calcium] Shortness Of Breath    Muscle cramps   Spironolactone Shortness Of Breath   Terazosin  Shortness Of Breath, Palpitations and Other (See Comments)    Nerve pain   Amiodarone Other (See Comments)    Dropped pulse real low to 45-50, and made him feel very cold   Atenolol Other (See Comments)    Drowsiness and "flu sxs"   Bystolic [Nebivolol Hcl] Other (See Comments)    GI issues   Hydralazine Other (See Comments)    Joint swelling   Tizanidine Other (See Comments)    Syncope, Elevated BP/pulse   Amlodipine Other (See Comments)   Clonidine Other (See Comments)   Hydrocodone-Acetaminophen Other (See Comments)   Levofloxacin Other (See Comments)   Losartan Other (See Comments)    Did not work.  Pt states he couldn't take b/c of SE, but can't remember what SE were.   Maxzide [Hydrochlorothiazide W-Triamterene] Other (See Comments)    Does not tolerate potassium-sparing diuretics   Olmesartan Other (See Comments)   Tape Other (See Comments)    Other reaction(s): Unknown   Tussionex Pennkinetic Er [Hydrocod Polst-Cpm Polst Er] Other (See Comments)    Other reaction(s): Unknown    Social History:   Social History   Socioeconomic History   Marital status: Married    Spouse name: [name]   Number of children: 1   Years of education: 12   Highest education  level: Not on file  Occupational History   Occupation: Retired  Tobacco Use   Smoking status: Never   Smokeless tobacco: Never  Vaping Use   Vaping Use: Never used  Substance and Sexual Activity   Alcohol use: No   Drug use: No   Sexual activity: Not on file  Other Topics Concern   Not on file  Social History Narrative   Lives with wife   Right Handed   Drinks no caffeine   Social Determinants of Health   Financial Resource Strain: Low Risk    Difficulty of Paying Living Expenses: Not hard at all  Food Insecurity: No Food Insecurity   Worried About Charity fundraiser in the Last Year: Never true   Arboriculturist in the Last Year: Never true  Transportation Needs: No Transportation Needs   Lack of Transportation (Medical): No   Lack of Transportation (Non-Medical): No  Physical Activity: Inactive   Days of Exercise per Week: 0 days   Minutes of Exercise per Session: 0 min  Stress: No Stress Concern Present   Feeling of Stress : Not at all  Social Connections: Moderately Isolated   Frequency of Communication with Friends and Family: More than three times a week   Frequency of Social Gatherings with Friends and Family: More than three times a week   Attends Religious Services: Never   Marine scientist or Organizations: No   Attends Music therapist: Never   Marital Status: Married  Human resources officer Violence: Not At Risk   Fear of Current or Ex-Partner: No   Emotionally Abused: No   Physically Abused: No   Sexually Abused: No    Family History:   Family History  Problem Relation Age of Onset   Hypertension Father 74       Deceased   Heart disease Father    Lung cancer Mother 27       Deceased   Healthy Sister        x3   Healthy Brother        x4   Heart disease Brother        #5  Other Daughter        Alpha Thalassemia   Lupus Daughter        #2-deceased   Diabetes Neg Hx    Heart attack Neg Hx    Hyperlipidemia Neg Hx    Sudden  death Neg Hx      ROS:  Please see the history of present illness.  All other ROS reviewed and negative.     Physical Exam/Data:   Vitals:   10/28/21 2013 10/28/21 2355 10/29/21 0427 10/29/21 0835  BP: 105/88 100/81 126/90 115/79  Pulse: (!) 106 (!) 102 (!) 104 100  Resp: 20 17 20 20   Temp: 98 F (36.7 C) 98.1 F (36.7 C) 97.7 F (36.5 C) (!) 97.5 F (36.4 C)  TempSrc: Oral Oral Oral Oral  SpO2: 98% 97% 99% 98%  Weight:   87.5 kg   Height:        Intake/Output Summary (Last 24 hours) at 10/29/2021 0959 Last data filed at 10/29/2021 0908 Gross per 24 hour  Intake 387 ml  Output 300 ml  Net 87 ml   Last 3 Weights 10/29/2021 10/28/2021 10/27/2021  Weight (lbs) 192 lb 14.4 oz 192 lb 12.8 oz 197 lb  Weight (kg) 87.5 kg 87.454 kg 89.359 kg     Body mass index is 29.33 kg/m.  General:  Well nourished, well developed, in no acute distress HEENT: normal Neck: no JVD Vascular: No carotid bruits; Distal pulses 2+ bilaterally Cardiac: irreg-irreg, tachycardic; no murmurs, gallops or rubs Lungs:  CTA b/l, no wheezing, rhonchi or rales  Abd: soft, nontender, no hepatomegaly  Ext: no edema Musculoskeletal:  No deformities, BUE and BLE strength normal and equal Skin: warm and dry  Neuro:  no focal abnormalities noted Psych:  Normal affect   EKG:  The EKG was personally reviewed and demonstrates:    AFib 143bpm, RBBB, LAD AFib 172bpm, RBBB  OLD SB 54bpm, 1st degree AVblock, RBBB, LAD  Telemetry:  Telemetry was personally reviewed and demonstrates:   AFib 110's, no bradycardia  Relevant CV Studies:  Nov 2022, monitor (7 days) Predominant rhythm was sinus rhythm 1% ventricular ectopy 6.2% supraventricular ectopy Multiple SVT episodes, all less than 20 seconds No symptoms recorded   Nov 2021 Monitoring Max 215 bpm 02:28pm, 10/28 Min 82 bpm 11:16am, 10/28 Avg 112 bpm 3.3% PVC burden, rare supraventricular ectopy Atrial fibrillation/flutter occurred 100% of the  time Longest ventricular run 10 seconds at 161 bpm No symptoms recorded       02/24/2018: stress myoview Nuclear stress EF: 58%. Normal perfusion. No ischemia This is a low risk study.       02/24/2018: TTE Study Conclusions  - Left ventricle: The cavity size was normal. Systolic function was    normal. The estimated ejection fraction was in the range of 55%    to 60%. Wall motion was normal; there were no regional wall    motion abnormalities. There was a reduced contribution of atrial    contraction to ventricular filling, due to increased ventricular    diastolic pressure or atrial contractile dysfunction. The study    is not technically sufficient to allow evaluation of LV diastolic    function. (suspect left atrial mechanical failure, possible    stunning after recent episode of atrial fibrillation versus    chronic atrial dysfunction)  - Left atrium: The atrium was mildly dilated.  - Right ventricle: The cavity size was mildly dilated. Wall    thickness was normal.  - Right  atrium: The atrium was mildly dilated.  - Pulmonary arteries: Systolic pressure was moderately increased.    PA peak pressure: 60 mm Hg (S).    6./15/18: 48 hor holter Minimum HR: 37 BPM at 12:18:54 PM Maximum HR: 124 BPM at 7:11:26 PM Average HR: 50 BPM 1% ventricular beats 4% supraventricular beats Ventricular runs of 6 beats Supraventricular runs of 13 beats   Zero atrial fibrillaiton noted on monitor. Average HR 50. No med changes at this time.  Will [name], MD   10/01/16 TTE - Left ventricle: The cavity size was normal. Systolic function was   normal. The estimated ejection fraction was in the range of 55%   to 60%. Wall motion was normal; there were no regional wall   motion abnormalities. Doppler parameters are consistent with a   reversible restrictive pattern, indicative of decreased left   ventricular diastolic compliance and/or increased left atrial   pressure (grade 3 diastolic  dysfunction). Doppler parameters are   consistent with high ventricular filling pressure. - Aortic valve: Transvalvular velocity was within the normal range.   There was no stenosis. - Mitral valve: Transvalvular velocity was within the normal range.   There was no evidence for stenosis. There was mild regurgitation. - Right ventricle: The cavity size was mildly dilated. Wall   thickness was normal. Systolic function was normal. - Atrial septum: No defect or patent foramen ovale was identified   by color flow Doppler. - Tricuspid valve: There was mild regurgitation. - Pulmonary arteries: Systolic pressure was moderately to severely   increased. PA peak pressure: 57 mm Hg (S). - Pericardium, extracardiac: A trivial pericardial effusion was   identified.   10/10/15: lexiscan stress myoview There was no ST segment deviation noted during stress. No T wave inversion was noted during stress. The study is normal. This is a low risk study.  Low risk stress nuclear study with normal perfusion. Non gated study.   Laboratory Data:  High Sensitivity Troponin:   Recent Labs  Lab 10/05/21 0442 10/27/21 1116 10/27/21 1315 10/28/21 1030 10/28/21 1215  TROPONINIHS 18* 13 16 15  18*     Chemistry Recent Labs  Lab 10/27/21 1116 10/28/21 1030 10/29/21 0133  NA 138 141 134*  K 3.1* 2.9* 3.5  CL 99 100 100  CO2 28 26 27   GLUCOSE 116* 108* 93  BUN 15 17 12   CREATININE 1.31* 1.25* 1.15  CALCIUM 9.1 10.2 8.9  MG 1.7 1.7  --   GFRNONAA 56* 60* >60  ANIONGAP 11 15 7     Recent Labs  Lab 10/27/21 1116 10/28/21 1030  PROT 7.2 7.4  ALBUMIN 3.7 4.3  AST 26 22  ALT 20 21  ALKPHOS 62 64  BILITOT 2.3* 1.9*   Lipids No results for input(s): CHOL, TRIG, HDL, LABVLDL, LDLCALC, CHOLHDL in the last 168 hours.  Hematology Recent Labs  Lab 10/27/21 1116 10/28/21 1030 10/29/21 0133  WBC 6.2 6.7 7.0  RBC 5.69 5.65 5.36  HGB 16.7 16.3 15.6  HCT 50.2 50.4 47.7  MCV 88.2 89.2 89.0  MCH  29.3 28.8 29.1  MCHC 33.3 32.3 32.7  RDW 13.7 14.1 14.0  PLT 199 196 203   Thyroid No results for input(s): TSH, FREET4 in the last 168 hours.  BNP Recent Labs  Lab 10/27/21 1116 10/28/21 1030  BNP 133.7* 116.2*    DDimer No results for input(s): DDIMER in the last 168 hours.   Radiology/Studies:  [facility] Chest Port 1 View  Result Date: 10/28/2021  CLINICAL DATA:  Shortness of breath. EXAM: PORTABLE CHEST 1 VIEW COMPARISON:  Chest radiograph 10/27/2021 FINDINGS: The cardiomediastinal silhouette is within normal limits. Lung volumes are increased compared to the prior study. No airspace consolidation, edema, pleural effusion, or pneumothorax is identified. Telemetry leads overlie the chest. No acute osseous abnormality is seen. IMPRESSION: No active disease. Electronically Signed   By: Logan Bores M.D.   On: 10/28/2021 10:40   DG Chest Port 1 View  Result Date: 10/27/2021 CLINICAL DATA:  Shortness of breath EXAM: PORTABLE CHEST 1 VIEW COMPARISON:  10/04/2021 FINDINGS: The heart size and mediastinal contours are within normal limits. Both lungs are clear. The visualized skeletal structures are unremarkable. IMPRESSION: No active disease. Electronically Signed   By: Elmer Picker M.D.   On: 10/27/2021 11:44     Assessment and Plan:   Paroxysmal Afib CHA2DS2Vasc is 5, on Eliquis RVR  2. Dizziness/near syncope This seems to be associated with difficult BMs/abdominal cramping Also is usually found in AFib w/RVR as well   Plan would be ultimately to start Tikosyn, discussed with the patient, he is agreeable Unfortunately he is on Chlorthalidone and is high risk of QT prolongation with Tiksoyn, and contraindicated together It has a 1/2 life of 48 hours and we will need three 1/2 lives, so unable to start this admission.  Will stop his amlodipine and start diltiazem 60 Q6 and see if we can't get rate control in the meantime  I will reach out to the AFib clinic to start working on  [place] admission as soon as able after a week.  STOP chlorthalidone at time of discharge   3. Abdominal/BM concerns Deferred to IM   Risk Assessment/Risk Scores:    For questions or updates, please contact [place] Please consult [email or web address] for contact info under    Signed, Baldwin Jamaica, PA-C  10/29/2021 9:59 AM    I have seen, examined the patient, and reviewed the above assessment and plan.  Changes to above are made where necessary.  On exam, iRRR.  Will plan to pursue sinus with tikosyn after chlorthalidone washout.  In the interim, we will continue rate control  Co Sign: Thompson Grayer, MD

## 2021-10-29 NOTE — TOC Benefit Eligibility Note (Signed)
Patient Teacher, English as a foreign language completed.    The patient is currently admitted and upon discharge could be taking dofetilide (Tikosyn) 500 mcg capsule.  The current 30 day co-pay is, $4.34.   The patient is insured through [employer], [place] Patient Advocate Specialist [place] Patient Advocate Team Direct Number: [phone]  Fax: [phone]

## 2021-10-29 NOTE — Care Management Obs Status (Signed)
[facility] NOTIFICATION   Patient Details  Name: Patrick Roth MRN: [id number] Date of Birth: 1946/09/10   Medicare Observation Status Notification Given:  Yes    Dawayne Patricia, RN 10/29/2021, 4:11 PM

## 2021-10-29 NOTE — Discharge Instructions (Addendum)
  YOU ARE SCHEDULED TO BE ADMITTED TO [facility] TO START THE NEW MEDICINE [place] ON 11/10/21.  Anticipate a staying Tues 12/20-Friday 12/24 You have an appointment with the AFib clinic 11/10/21 at 11:00AM and will be admitted from there  Patrick Roth (Dofetilide) Hospital Admission   Prior to day of admission:   No Benadryl is allowed 3 days prior to admission.   Please ensure no missed doses of your anticoagulation (blood thinner) for 3 weeks prior to admission. If a dose is missed please notify our office immediately.   A pharmacist will review all your medications for potential interactions with Tikosyn. If any medication changes are needed prior to admission we will be in touch with you.   If any new medications are started AFTER your admission date is set with Nurse, adult. Please notify our office immediately so your medication list can be updated and reviewed by our pharmacist again.  On day of admission:  Tikosyn initiation requires a 3 night/4 day hospital stay with constant telemetry monitoring. You will have an EKG after each dose of Tikosyn as well as daily lab draws.   If the drug does not convert you to normal rhythm a cardioversion after the 4th dose of Tikosyn.   Afib Clinic office visit on the morning of admission is needed for preliminary labs/ekg.   Time of admission is dependent on bed availability in the hospital. In some instances, you will be sent home until bed is available. Rarely admission can be delayed to the following day if hospital census prevents available beds.   You may bring personal belongings/clothing with you to the hospital. Please leave your suitcase in the car until you arrive in admissions.   Questions please call our office at [phone]          You have an appointment set up with the [facility].   The phone number to the [facility] is [phone]. The clinic is staffed Monday through Friday from 8:30am to  5pm.  Parking Directions: The clinic is located in the Heart and Vascular Building connected to [facility]. 1)From [address] turn on to [address] and go to the 3rd entrance  (Heart and Vascular entrance) on the right. 2)Look to the right for Heart &Vascular Parking Garage. 3)A code for the entrance is required, for Dec is 5555.   4)Take the elevators to the 1st floor. Registration is in the room with the glass walls at the end of the hallway.  If you have any trouble parking or locating the clinic, please don't hesitate to call [phone].

## 2021-10-29 NOTE — Consult Note (Signed)
Consultation  Referring Provider:    Dr. Bonner Puna Primary Care Physician:  Allwardt, [name], PA-C Primary Gastroenterologist:  Althia Forts here-  Dr. Gerri Spore at [place]     Reason for Consultation:    Rectal spasms and globulus sensation         HPI:   Patrick Roth is a 75 y.o. male with history of atrial flutter, hyperlipidemia, hypertension, CVA February 2022, patient is on Eliquis.  Presents to the ER with diarrhea and shortness of breath. Patient follows with Dr. [name] outpatient for atrial fibrillation, patient is on Eliquis. Patient previously in 2016 had seen Dr. Hilarie Fredrickson, most recently appears to have seen GI Dr.Radha Menon with colonoscopy 12/01/2020 at [facility] endoscopy center, unable to see full report.  Reviewing hospital records patient appears to have had 5 total ER visits since just November, majority of these for dizziness/near syncope episodes, vasovagal episodes and atrial flutter/A. fib.  States Wednesday morning had the urge to have a bowel movement, had straining with formed brown stool then proceeded to lumpy loose stools, denies blood in the stool.  Did have some lower abdominal discomfort and cramping with this which is not abnormal for him.  Patient then had numbness all over, felt faint, short of breath and felt like his heart was beating out of his chest so presented to the ER.  Patient states he has been having GI tract and bladder issues his entire life. Had a fall July 2022 and started having back injections states it is gotten worse since that time. States has more straining with stools, will have bowel movement every 2 to 3 weeks or potentially just once a week.  Normally formed brown stool, very rarely hard.  Denies melena or hematochezia. Occasionally will take Metamucil every other day. Does not take miralax outpatient.  Will take oxycodone once a week for back pain. Patient states its not constipation but "nerves that opens a door while  released". When the patient does strain can have episodes of feeling faint, numb all over, heart rate will increase, shortness of breath.  Patient states he has had globulus sensation for years intermittently. Will clear his throat, have white mucus, and occasionally will feel better.  Denies sinus symptoms Was to have endoscopy outpatient but had the symptoms instead.  Has never had 1 previously Denies dysphagia, choking or coughing with foods, reflux, abdominal pain. Denies smoking history and alcohol history.  ED course:  Patient was having RVR with A. fib, given metoprolol and magnesium. CBC 7 without leukocytosis, hemoglobin 15.6 at baseline without anemia Normal kidney function, sodium 134 troponin 18  Previous GI work up: Last office visit 2016 with Dr. Hilarie Fredrickson. Colonoscopy at endoscopy center 12/01/2020 Colonoscopy and endoscopy 2011 with Dr. Sharlett Iles showed internal hemorrhoids no polyps follow-up 10 years She has never had endoscopy.  Past Medical History:  Diagnosis Date  . Atrial flutter ([place])   . Colon polyps   . Constipation 05/27/2015  . DOE (dyspnea on exertion) 10/13/2015  . Faintness 06/20/2016  . History of chicken pox   . Hyperlipidemia   . Hypertension   . Internal hemorrhoids   . Malignant hypertension 07/15/2015  . Sciatica   . Stroke [facility]) 2008  . Syncope and collapse 06/28/2016    Past Surgical History:  Procedure Laterality Date  . CARDIOVERSION N/A 09/02/2017   Procedure: CARDIOVERSION;  Surgeon: [name], MD;  Location: [place];  Service: Cardiovascular;  Laterality: N/A;  .  COLONOSCOPY  2011  . great toe surgery    . PROSTATE BIOPSY      Family History  Problem Relation Age of Onset  . Hypertension Father 63       Deceased  . Heart disease Father   . Lung cancer Mother 81       Deceased  . Healthy Sister        x3  . Healthy Brother        x4  . Heart disease Brother        #5  . Other Daughter        Alpha  Thalassemia  . Lupus Daughter        #2-deceased  . Diabetes Neg Hx   . Heart attack Neg Hx   . Hyperlipidemia Neg Hx   . Sudden death Neg Hx      Social History   Tobacco Use  . Smoking status: Never  . Smokeless tobacco: Never  Vaping Use  . Vaping Use: Never used  Substance Use Topics  . Alcohol use: No  . Drug use: No    Prior to Admission medications   Medication Sig Start Date End Date Taking? Authorizing Provider  acetaminophen (TYLENOL) 650 MG CR tablet Take 1,300 mg by mouth every 8 (eight) hours as needed for pain.   Yes [provider]  amLODipine (NORVASC) 10 MG tablet Take 10 mg by mouth daily.   Yes [provider]  apixaban (ELIQUIS) 5 MG TABS tablet Take 1 tablet (5 mg total) by mouth 2 (two) times daily. 06/16/21  Yes Camnitz, Will Hassell Done, MD  chlorthalidone (HYGROTON) 25 MG tablet Take 1 tablet (25 mg total) by mouth daily. 05/19/21  Yes Allwardt, Alyssa M, PA-C  Evolocumab (REPATHA SURECLICK) 836 MG/ML SOAJ Inject 1 pen into the skin every 14 (fourteen) days. 04/02/21  Yes Camnitz, Ocie Doyne, MD  Magnesium Oxide 400 MG CAPS Take 1 capsule (400 mg total) by mouth 2 (two) times daily. 62/94/76  Yes Delora Fuel, MD  metoprolol tartrate (LOPRESSOR) 25 MG tablet Take 1 tablet (25 mg total) by mouth 2 (two) times daily as needed (FOR HEARTRATE HIGHER THAN 120 bpm). 10/05/21  Yes Baldwin Jamaica, PA-C  oxyCODONE-acetaminophen (PERCOCET/ROXICET) 5-325 MG tablet Take 1 tablet by mouth every 4 (four) hours as needed for severe pain. 01/24/20  Yes Malvin Johns, MD  polyethylene glycol powder (GLYCOLAX/MIRALAX) 17 GM/SCOOP powder Take 17 g by mouth in the morning and at bedtime. Start with 1 scoop 2 times per day until bowels are moving, then reduce to 1 scoop daily. Drink 64oz of water daily. 09/18/21  Yes Hudnell, Colletta [place], NP  potassium chloride SA (KLOR-CON) 20 MEQ tablet TAKE 1 TABLET BY MOUTH IN THE MORNING AND TAKE 2 TABLETS IN THE EVENING 10/12/21   Yes Allwardt, Alyssa M, PA-C  psyllium (METAMUCIL) 58.6 % powder Take 1 packet by mouth daily as needed (constipation).   Yes [provider]    Current Facility-Administered Medications  Medication Dose Route Frequency Provider Last Rate Last Admin  . acetaminophen (TYLENOL) tablet 650 mg  650 mg Oral Q6H PRN Karmen Bongo, MD   650 mg at 10/28/21 2251   Or  . acetaminophen (TYLENOL) suppository 650 mg  650 mg Rectal Q6H PRN Karmen Bongo, MD      . amLODipine (NORVASC) tablet 10 mg  10 mg Oral Daily Karmen Bongo, MD   10 mg at 10/29/21 0904  . apixaban (ELIQUIS) tablet 5 mg  5 mg Oral BID Karmen Bongo, MD   5 mg at 10/29/21 7169  . hydrALAZINE (APRESOLINE) injection 5 mg  5 mg Intravenous Q4H PRN Karmen Bongo, MD      . magnesium oxide (MAG-OX) tablet 400 mg  400 mg Oral BID Karmen Bongo, MD   400 mg at 10/29/21 0905  . metoprolol tartrate (LOPRESSOR) tablet 25 mg  25 mg Oral BID PRN Karmen Bongo, MD      . nicotine (NICODERM CQ - dosed in mg/24 hours) patch 14 mg  14 mg Transdermal Daily Karmen Bongo, MD      . ondansetron [facility]) tablet 4 mg  4 mg Oral Q6H PRN Karmen Bongo, MD       Or  . ondansetron [facility]) injection 4 mg  4 mg Intravenous Q6H PRN Karmen Bongo, MD      . oxyCODONE-acetaminophen (PERCOCET/ROXICET) 5-325 MG per tablet 1 tablet  1 tablet Oral Q4H PRN Karmen Bongo, MD      . polyethylene glycol (MIRALAX / GLYCOLAX) packet 17 g  17 g Oral BID PRN Karmen Bongo, MD      . Derrill Memo ON 10/31/2021] potassium chloride SA (KLOR-CON M) CR tablet 20 mEq  20 mEq Oral BID Loletta Parish, MD      . potassium chloride SA (KLOR-CON M) CR tablet 40 mEq  40 mEq Oral BID Karmen Bongo, MD   40 mEq at 10/29/21 0904  . psyllium (HYDROCIL/METAMUCIL) 1 packet  1 packet Oral Daily PRN Karmen Bongo, MD      . sodium chloride flush (NS) 0.9 % injection 3 mL  3 mL Intravenous Q12H Karmen Bongo, MD   3 mL at 10/29/21 0906  . sodium phosphate  (FLEET) 7-19 GM/118ML enema 1 enema  1 enema Rectal Once Karmen Bongo, MD        Allergies as of 10/28/2021 - Review Complete 10/28/2021  Allergen Reaction Noted  . Crestor [rosuvastatin calcium] Shortness Of Breath 02/25/2016  . Spironolactone Shortness Of Breath 06/20/2016  . Terazosin Shortness Of Breath, Palpitations, and Other (See Comments) 04/21/2016  . Amiodarone Other (See Comments) 09/11/2020  . Atenolol Other (See Comments) 06/27/2015  . Bystolic [nebivolol hcl] Other (See Comments) 06/20/2016  . Hydralazine Other (See Comments) 06/27/2015  . Tizanidine Other (See Comments) 06/28/2016  . Amlodipine Other (See Comments) 06/27/2015  . Clonidine Other (See Comments) 06/27/2015  . Hydrocodone-acetaminophen Other (See Comments) 06/27/2015  . Levofloxacin Other (See Comments) 06/27/2015  . Losartan Other (See Comments) 06/27/2015  . Maxzide [hydrochlorothiazide w-triamterene] Other (See Comments) 06/20/2016  . Olmesartan Other (See Comments) 06/27/2015  . Tape Other (See Comments) 08/23/2012  . Tussionex pennkinetic er [employer] polst-cpm polst er] Other (See Comments) 08/23/2012    Review of Systems:    Constitutional: No weight loss, fever, chills, weakness or fatigue HEENT: Eyes: No change in vision               Ears, Nose, Throat:  No change in hearing or congestion Skin: No rash or itching Cardiovascular: No chest pain, chest pressure or palpitations   Respiratory: No SOB or cough Gastrointestinal: See HPI and otherwise negative Genitourinary: No dysuria or change in urinary frequency Neurological: No headache, dizziness or syncope Musculoskeletal: No new muscle or joint pain Hematologic: No bleeding or bruising Psychiatric: No history of depression or anxiety     Physical Exam:  Vital signs in last 24 hours: Temp:  [97.5 F (36.4 C)-98.1 F (36.7 C)] 97.5 F (36.4 C) (12/08 0835) Pulse  Rate:  [81-106] 100 (12/08 0835) Resp:  [17-23] 20 (12/08 0835) BP:  (92-126)/(72-104) 115/79 (12/08 0835) SpO2:  [95 %-100 %] 98 % (12/08 0835) Weight:  [87.5 kg] 87.5 kg (12/08 0427) Last BM Date: 10/28/21  General:   Pleasant, well developed male in no acute distress Head:  Normocephalic and atraumatic. Eyes:  sclerae anicteric,conjunctive pink. Ears: Normal auditory acuity Neck: Supple, no masses.  Heart:  Irreg, irreg, tachy. no murmurs or gallops Pulm: Clear anteriorly; no wheezing Abdomen:  Soft, Obese AB, skin exam well healed scar on lower AB from injury, Normal bowel sounds.  no  tenderness  on exam . Without guarding and Without rebound, without hepatomegaly. Extremities:  Without edema. Msk:  Symmetrical without gross deformities. Peripheral pulses intact.  Neurologic:  Alert and  oriented x4;  grossly normal neurologically. CN II-XII intact.  Skin:   Dry and intact without significant lesions or rashes. Psychiatric: Demonstrates good judgement and reason without abnormal affect or behaviors.   LAB RESULTS: Recent Labs    10/27/21 1116 10/28/21 1030 10/29/21 0133  WBC 6.2 6.7 7.0  HGB 16.7 16.3 15.6  HCT 50.2 50.4 47.7  PLT 199 196 203   BMET Recent Labs    10/27/21 1116 10/28/21 1030 10/29/21 0133  NA 138 141 134*  K 3.1* 2.9* 3.5  CL 99 100 100  CO2 28 26 27   GLUCOSE 116* 108* 93  BUN 15 17 12   CREATININE 1.31* 1.25* 1.15  CALCIUM 9.1 10.2 8.9   LFT Recent Labs    10/28/21 1030  PROT 7.4  ALBUMIN 4.3  AST 22  ALT 21  ALKPHOS 64  BILITOT 1.9*   PT/INR No results for input(s): LABPROT, INR in the last 72 hours.  STUDIES: DG Chest Port 1 View  Result Date: 10/28/2021 CLINICAL DATA:  Shortness of breath. EXAM: PORTABLE CHEST 1 VIEW COMPARISON:  Chest radiograph 10/27/2021 FINDINGS: The cardiomediastinal silhouette is within normal limits. Lung volumes are increased compared to the prior study. No airspace consolidation, edema, pleural effusion, or pneumothorax is identified. Telemetry leads overlie the chest. No  acute osseous abnormality is seen. IMPRESSION: No active disease. Electronically Signed   By: Logan Bores M.D.   On: 10/28/2021 10:40   DG Chest Port 1 View  Result Date: 10/27/2021 CLINICAL DATA:  Shortness of breath EXAM: PORTABLE CHEST 1 VIEW COMPARISON:  10/04/2021 FINDINGS: The heart size and mediastinal contours are within normal limits. Both lungs are clear. The visualized skeletal structures are unremarkable. IMPRESSION: No active disease. Electronically Signed   By: Elmer Picker M.D.   On: 10/27/2021 11:44     Impression    . Atrial flutter with rapid ventricular response (HCC) Heart rate still slightly Denying chest pain shortness of breath On Eliquis last dose this morning . Vasovagal syndrome Has had several episodes leading to the ER Following with cardiology . Constipation Appears to be more chronic Normal colonoscopy per patient 11/2020 with outpatient gastroenterologist at [place] Oxycodone use once a week History of lower back pain Has straining which causes vasovagal, does sound potentially more like potential pelvic floor dysfunction. Dorothyann Peng sensation Appears to have been intermittent and long-term No dysphagia or signs of pharyngeal issues. Had stroke February 2022 . History of CVA  Last episode Feb 2022 On Eliquis    Plan   Can consider outpatient anal manometry with GI doctor but with normal recent colonoscopy does not need inpatient work-up at this time.  Continue to follow sports medicine  for lumbar radiculopathy  Patient was mostly scheduled for endoscopy outpatient, with recent A. fib with RVR and this being a chronic issue without dysphagia, can get barium swallow inpatient to evaluate further.  If negative proceed with outpatient EGD with primary GI.  Patient instructed to get squatty potty, wear compression socks outpatient.  Thank you for your kind consultation, we will continue to follow.  Vladimir Crofts  10/29/2021, 10:16  AM

## 2021-10-29 NOTE — Plan of Care (Signed)

## 2021-10-29 NOTE — Telephone Encounter (Signed)
   Name: Patrick Roth  DOB: 04-23-1946  MRN: [id number]  Primary Cardiologist: Mertie Moores, MD  Chart reviewed as part of pre-operative protocol coverage. He is currently actively in the hospital with atrial fibrillation and rapid ventricular response with near-syncope, undergoing evaluation by the cardiology team with EP consultation planned today along with GI consultation (note incomplete). Given ongoing workup, need to hold on any formal finalization of outpatient recs at this time. Preop will follow peripherally.  Charlie Pitter, PA-C  10/29/2021, 10:42 AM

## 2021-10-30 ENCOUNTER — Observation Stay (HOSPITAL_COMMUNITY): Payer: Medicare HMO

## 2021-10-30 DIAGNOSIS — K449 Diaphragmatic hernia without obstruction or gangrene: Secondary | ICD-10-CM | POA: Diagnosis not present

## 2021-10-30 DIAGNOSIS — K224 Dyskinesia of esophagus: Secondary | ICD-10-CM | POA: Diagnosis not present

## 2021-10-30 DIAGNOSIS — R1310 Dysphagia, unspecified: Secondary | ICD-10-CM | POA: Diagnosis not present

## 2021-10-30 MED ORDER — DICYCLOMINE HCL 20 MG PO TABS
20.0000 mg | ORAL_TABLET | Freq: Four times a day (QID) | ORAL | Status: DC | PRN
  Administered 2021-10-30 – 2021-10-31 (×2): 20 mg via ORAL
  Filled 2021-10-30 (×3): qty 1

## 2021-10-30 MED ORDER — DILTIAZEM HCL 60 MG PO TABS
90.0000 mg | ORAL_TABLET | Freq: Four times a day (QID) | ORAL | Status: DC
  Administered 2021-10-30 – 2021-10-31 (×3): 90 mg via ORAL
  Filled 2021-10-30 (×3): qty 2

## 2021-10-30 NOTE — Progress Notes (Signed)
Pt states that he continues to have a burning pain in Left lower abdomin that radiates to his  back. Pt has had poor appetite and barely eating.  Pt states that even a small amount of food triggers this pain response.  MD notified.

## 2021-10-30 NOTE — Progress Notes (Signed)
While standing at bedside with mobility specialist Pt stated he felt like he was going to "pass out" and HR reached 224 BPM.  Pt lay back down and HR quickly returned  to 110's and Pt felt better.  Reviewed with CCMD and strip documented.  MD notified.

## 2021-10-30 NOTE — Discharge Summary (Incomplete)
DISCHARGE SUMMARY    Patient ID: Patrick Roth,  MRN: [id number], DOB/AGE: 1946-04-21 75 y.o.  Admit date: 10/28/2021 Discharge date: 10/30/2021  Primary Care Physician: Allwardt, [name], PA-C  Primary Cardiologist: Dr. Acie Fredrickson Electrophysiologist: Dr. [name]  Primary Discharge Diagnosis:  Paroxysmal Afib CHA2DS2Vasc is 5, on Eliquis appropriately dosed Abdominal cramping Constipation/diarrhea Recurrent weak spells, near syncope  Secondary Discharge Diagnosis:  HTN Stroke (old) HLD Polycythema Conduction system disease Trifascicular block  Allergies  Allergen Reactions   Crestor [Rosuvastatin Calcium] Shortness Of Breath    Muscle cramps   Spironolactone Shortness Of Breath   Terazosin Shortness Of Breath, Palpitations and Other (See Comments)    Nerve pain   Amiodarone Other (See Comments)    Dropped pulse real low to 45-50, and made him feel very cold   Atenolol Other (See Comments)    Drowsiness and "flu sxs"   Bystolic [Nebivolol Hcl] Other (See Comments)    GI issues   Hydralazine Other (See Comments)    Joint swelling   Tizanidine Other (See Comments)    Syncope, Elevated BP/pulse   Amlodipine Other (See Comments)   Clonidine Other (See Comments)   Hydrocodone-Acetaminophen Other (See Comments)   Levofloxacin Other (See Comments)   Losartan Other (See Comments)    Did not work.  Pt states he couldn't take b/c of SE, but can't remember what SE were.   Maxzide [Hydrochlorothiazide W-Triamterene] Other (See Comments)    Does not tolerate potassium-sparing diuretics   Olmesartan Other (See Comments)   Tape Other (See Comments)    Other reaction(s): Unknown   Tussionex Pennkinetic Er [Hydrocod Polst-Cpm Polst Er] Other (See Comments)    Other reaction(s): Unknown     Procedures This Admission:  none   Brief HPI: Patrick Roth is a 75 y.o. male  with a hx of  AFib, HTN, CVA,  HLD, syncope attributed to orthostasis in 2017, h/o medication non  complinace, another stroke Feb 2022 (compliant with his [place] at the time)  >> polycythemia was admitted with recurrent weak spells,  near syncope and AFib, as well as c/o abdominal bloating/cramping, constipation/diarrhea  Hospital Course:  The patient was admitted with the above complaints, found in Afib w/RVR and admitted for further management, He has suffered with this for the last couple months and has had numerous ER visits and some hospitalizations as well.  He reports going back to childhood he has had weak spells, near fainting but this has acutely gotten worse. It is always associated with abdominal symptoms and almost always with either straining to have a BM w/constipation or the urge that he needs to have a BM.  Abdominal cramping is also part of his symptom scenario. On arrival to the ER he is also almost always in fast AFib, reaching rates to 200's Treated successfully in the ER with IV lopressor either to CVR or has spontaneous conversions as well.  This admission his labs again largely unrevealing.  EP consulted, and we have discussed in the patient though the patient had been hesitant, planned to pursue Tikosyn for his management of his Afib.  Though he has been chronically on chlorthalidone and this is contraindicated with Tikosyn, needing a full 6 day washout.  Elective admission/Afib clinic visit is planned for 11/10/21 the next available slot for this timeline.  GI was brought on board as well, also noting the likely hood of his GI symptoms prompting vagal symptoms of weakness though paradoxical reaction with tachy rates.  They wrapped up with recommendations: Dyssynergic defecation - Outpatient anorectal manometry, pelvic floor physical therapy, can be coordinated by primary GI as outpatient - Daily Citrucel   Globus sensation - Pepcid 20 mg PO daily for 4 weeks - Outpatient EGD with primary GI as previously planned  He had an barium swallow done that was abnormal  though in discussion with GI APP, also an out patient follow up, nothing further from their perspective in patient  While the patient was here he had his symptom of feeling weak like he may faint, he was at rest, HRs and BP were stable, again associated with some abdominal bloating/cramping and sense that he needed to have a BM. Dr. Quentin Ore felt now even more clearly that at least not all of his symptoms are rate/rhythm, BP issue, with a vagal conponent that is triggered by GI symptoms  With ambulation HR spiked and required further nodal blocking dose  ***  ***   Physical Exam: Vitals:   10/30/21 0439 10/30/21 0648 10/30/21 0718 10/30/21 1052  BP: 121/77 125/88 125/77 (!) 123/98  Pulse: (!) 107  85 98  Resp: 15 14 16 13   Temp: 97.7 F (36.5 C)  (!) 97.4 F (36.3 C) 97.7 F (36.5 C)  TempSrc: Oral  Oral Oral  SpO2: 99%  99% 99%  Weight: 85.5 kg     Height:        GEN- The patient is well appearing, alert and oriented x 3 today.   HEENT: normocephalic, atraumatic; sclera clear, conjunctiva pink; hearing intact; oropharynx clear Lungs- *** CTA b/l, normal work of breathing.  No wheezes, rales, rhonchi Heart- *** RRR, no murmurs, rubs or gallops, PMI not laterally displaced GI- soft, non-tender, non-distended Extremities- no clubbing, cyanosis, or edema MS- no significant deformity or atrophy Skin- warm and dry, no rash or lesion, left *** chest without hematoma/ecchymosis Psych- euthymic mood, full affect Neuro- no gross defecits  Labs:   Lab Results  Component Value Date   WBC 7.0 10/29/2021   HGB 15.6 10/29/2021   HCT 47.7 10/29/2021   MCV 89.0 10/29/2021   PLT 203 10/29/2021    Recent Labs  Lab 10/28/21 1030 10/29/21 0133  NA 141 134*  K 2.9* 3.5  CL 100 100  CO2 26 27  BUN 17 12  CREATININE 1.25* 1.15  CALCIUM 10.2 8.9  PROT 7.4  --   BILITOT 1.9*  --   ALKPHOS 64  --   ALT 21  --   AST 22  --   GLUCOSE 108* 93    Discharge Medications:  Allergies  as of 10/30/2021       Reactions   Crestor [rosuvastatin Calcium] Shortness Of Breath   Muscle cramps   Spironolactone Shortness Of Breath   Terazosin Shortness Of Breath, Palpitations, Other (See Comments)   Nerve pain   Amiodarone Other (See Comments)   Dropped pulse real low to 45-50, and made him feel very cold   Atenolol Other (See Comments)   Drowsiness and "flu sxs"   Bystolic [nebivolol Hcl] Other (See Comments)   GI issues   Hydralazine Other (See Comments)   Joint swelling   Tizanidine Other (See Comments)   Syncope, Elevated BP/pulse   Amlodipine Other (See Comments)   Clonidine Other (See Comments)   Hydrocodone-acetaminophen Other (See Comments)   Levofloxacin Other (See Comments)   Losartan Other (See Comments)   Did not work.  Pt states he couldn't take b/c of SE, but can't remember  what SE were.   Maxzide [hydrochlorothiazide W-triamterene] Other (See Comments)   Does not tolerate potassium-sparing diuretics   Olmesartan Other (See Comments)   Tape Other (See Comments)   Other reaction(s): Unknown   Tussionex Pennkinetic Er [hydrocod Polst-cpm Polst Er] Other (See Comments)   Other reaction(s): Unknown     Med Rec must be completed prior to using this [place]***       Disposition: Home   Follow-up Information     [facility].   Specialty: Cardiology Why: 11/10/21 @ 11:00AM with Maximino Greenland, NP, for admission to start the new medication Contact information: [address] [id number] [place] [address] [phone]                Duration of Discharge Encounter: Greater than 30 minutes including physician time.  Venetia Night, PA-C 10/30/2021 11:00 AM

## 2021-10-30 NOTE — Telephone Encounter (Signed)
   Patient Name: Patrick Roth  DOB: [birth year] MRN: [id number]  Primary Cardiologist: Mertie Moores, MD  Chart reviewed as part of pre-operative protocol coverage. I reached out to Tommye Standard, PA-C, who is following patient while he is admitted to the hospital. He was admitted with problems with his atrial fibrillation. Per Joseph Art, the EP team is planning to treat this patient with Tikosyn. He will have outpatient afib clinic follow-up with close readmission in the near future for Tikosyn loading. As such, he is not able to clear for this procedure as he is not able to hold his anticoagulation at this time. Timing of endoscopy will need to be revisited with EP team when his cardiac status is more stable and he is able to come off his anticoagulation. For now his clearance is denied. Will cc to [facility] so they are aware for future planning considerations later in follow-up.  Will route this bundled recommendation to requesting provider via Epic fax function. Please call with questions.  Charlie Pitter, PA-C 10/30/2021, 9:15 AM

## 2021-10-30 NOTE — Progress Notes (Signed)
The patient this morning has had recurrent episodes of feeling weak/like he may pass out intermittently most of the early morning In d/w the RN, she went to check on him because his HR was in the 150's, he was up at the sink  washing up, feeling WELL, and WITHOUT symptoms.  She stayed while he cleaned up and once back to bed HR settled quickly back to 90's-110's. Several minutes later the pt called that he felt like he was going to faint, on her arrival he was still in bed, appeared comfortable, BP taken 120's/70s, and HR 80's-110's fluctuating He was warm, not lethargic or altered, not in distress but verbalizing that he felt weak He ALSO was c/o some abdominal cramping and felt the need/sensation that he needed to have a BM.  This lasted several minutes with the sense of feeling weak, lightheaded, like if worse he may faint with stable BP and HRs  He had no bradycardia or tachy rates  When Dr. Quentin Ore and I go in the room his HR 80's-100's he is anxious about his symptoms that morning. Discussed importance of GI follow up Symptom management strategy for home  Suspect he will be able to go home one barium swallow is done if OK with GI.  HRs are not perfect though no sustained tachy rates, and no brady.  Plans for Tikosyn admission once chlorthalidone is washed out, scheduled for 12/20.  Will watch him this AM, suspect discharge this afternoon  Will not push rate controlling medication  Tommye Standard, PA-C

## 2021-10-30 NOTE — Progress Notes (Signed)
Mobility Specialist: Progress Note   10/30/21 1248  Mobility  Activity Stood at bedside  Level of Assistance Standby assist, set-up cues, supervision of patient - no hands on  Assistive Device Front wheel walker  Mobility  (Stood at bedside)  Mobility Response Tolerated poorly  Mobility performed by Mobility specialist  $Mobility charge 1 Mobility   Pre-Mobility:       Supine: 107 HR, 128/79 BP, 99% SpO2      Sitting EOB: 139/91 BP During Mobility: 224 HR Post-Mobility: 113 HR, 147/87 BP, 97% SpO2  Performed orthostatic BP on pt but when pt stood he said he felt like he was going to pass out. Had pt lay back down d/t tachycardia with HR peaking at 224 bpm, RN aware. Pt's HR returned to 120s-130s after getting back in the bed. Pt has call bell and phone at his side.   [facility] Caryl Manas Mobility Specialist Mobility Specialist Phone #1: [phone] Mobility Specialist Phone #2: [phone]

## 2021-10-30 NOTE — Progress Notes (Signed)
Progress Note  Patient Name: Patrick Roth Date of Encounter: 10/30/2021  [facility] Cardiologist: Mertie Moores, MD   Subjective   "I was feeling bad this AM, felt weak like fainting", no CP, + abd cramping  Inpatient Medications    Scheduled Meds:  apixaban  5 mg Oral BID   diltiazem  60 mg Oral Q6H   famotidine  20 mg Oral Daily   magnesium oxide  400 mg Oral BID   nicotine  14 mg Transdermal Daily   [START ON 10/31/2021] potassium chloride SA  20 mEq Oral BID PC   sodium chloride flush  3 mL Intravenous Q12H   sodium phosphate  1 enema Rectal Once   Continuous Infusions:  PRN Meds: acetaminophen **OR** acetaminophen, hydrALAZINE, metoprolol tartrate, oxyCODONE-acetaminophen, polyethylene glycol, psyllium   Vital Signs    Vitals:   10/30/21 0648 10/30/21 0718 10/30/21 0724 10/30/21 1052  BP: 125/88 125/77  (!) 123/98  Pulse:  85 94 98  Resp: 14 16 15 15   Temp:  (!) 97.4 F (36.3 C)  97.7 F (36.5 C)  TempSrc:  Oral  Oral  SpO2:  99% 100% 99%  Weight:      Height:        Intake/Output Summary (Last 24 hours) at 10/30/2021 1319 Last data filed at 10/30/2021 1057 Gross per 24 hour  Intake 387 ml  Output 825 ml  Net -438 ml   Last 3 Weights 10/30/2021 10/29/2021 10/28/2021  Weight (lbs) 188 lb 6.4 oz 192 lb 14.4 oz 192 lb 12.8 oz  Weight (kg) 85.458 kg 87.5 kg 87.454 kg      Telemetry    Telemetry 80's-110s - Personally Reviewed  ECG    No new EKGs - Personally Reviewed  Physical Exam   GEN: No acute distress.   Neck: No JVD Cardiac: irreg-irreg, no murmurs, rubs, or gallops.  Respiratory: CTA b/l. GI: Soft, nontender, non-distended  MS: No edema; No deformity. Neuro:  Nonfocal  Psych: Normal affect   Labs    High Sensitivity Troponin:   Recent Labs  Lab 10/05/21 0442 10/27/21 1116 10/27/21 1315 10/28/21 1030 10/28/21 1215  TROPONINIHS 18* 13 16 15  18*     Chemistry Recent Labs  Lab 10/27/21 1116 10/28/21 1030 10/29/21 0133   NA 138 141 134*  K 3.1* 2.9* 3.5  CL 99 100 100  CO2 28 26 27   GLUCOSE 116* 108* 93  BUN 15 17 12   CREATININE 1.31* 1.25* 1.15  CALCIUM 9.1 10.2 8.9  MG 1.7 1.7  --   PROT 7.2 7.4  --   ALBUMIN 3.7 4.3  --   AST 26 22  --   ALT 20 21  --   ALKPHOS 62 64  --   BILITOT 2.3* 1.9*  --   GFRNONAA 56* 60* >60  ANIONGAP 11 15 7     Lipids No results for input(s): CHOL, TRIG, HDL, LABVLDL, LDLCALC, CHOLHDL in the last 168 hours.  Hematology Recent Labs  Lab 10/27/21 1116 10/28/21 1030 10/29/21 0133  WBC 6.2 6.7 7.0  RBC 5.69 5.65 5.36  HGB 16.7 16.3 15.6  HCT 50.2 50.4 47.7  MCV 88.2 89.2 89.0  MCH 29.3 28.8 29.1  MCHC 33.3 32.3 32.7  RDW 13.7 14.1 14.0  PLT 199 196 203   Thyroid No results for input(s): TSH, FREET4 in the last 168 hours.  BNP Recent Labs  Lab 10/27/21 1116 10/28/21 1030  BNP 133.7* 116.2*    DDimer No  results for input(s): DDIMER in the last 168 hours.   Radiology    DG ESOPHAGUS W SINGLE CM (SOL OR THIN BA)  Result Date: 10/30/2021 CLINICAL DATA:  75 year old male with history of dysphagia. Sensation of something stuck in the throat. EXAM: ESOPHOGRAM/BARIUM SWALLOW TECHNIQUE: Single contrast examination was performed using  thin barium. FLUOROSCOPY TIME:  Fluoroscopy Time:  1.0 Radiation Exposure Index (if provided by the fluoroscopic device): 7.2 Number of Acquired Spot Images: 0 COMPARISON:  None. FINDINGS: Within the limitations of today's single contrast examination, there was no esophageal mass, stricture or esophageal ring. Small hiatal hernia. Within the hiatal hernia there is a focal outpouching, compatible with a small diverticulum. Esophageal motility was grossly abnormal with failure to propagate normal primary peristaltic waves. Extensive tertiary contractions were also observed. IMPRESSION: 1. Nonspecific esophageal motility disorder with extensive tertiary contractions. 2. Small hiatal hernia. Small diverticulum noted within the hiatal  hernia. Electronically Signed   By: Vinnie Langton M.D.   On: 10/30/2021 08:47    Cardiac Studies   Nov 2022, monitor (7 days) Predominant rhythm was sinus rhythm 1% ventricular ectopy 6.2% supraventricular ectopy Multiple SVT episodes, all less than 20 seconds No symptoms recorded     Nov 2021 Monitoring Max 215 bpm 02:28pm, 10/28 Min 82 bpm 11:16am, 10/28 Avg 112 bpm 3.3% PVC burden, rare supraventricular ectopy Atrial fibrillation/flutter occurred 100% of the time Longest ventricular run 10 seconds at 161 bpm No symptoms recorded       02/24/2018: stress myoview Nuclear stress EF: 58%. Normal perfusion. No ischemia This is a low risk study.       02/24/2018: TTE Study Conclusions  - Left ventricle: The cavity size was normal. Systolic function was    normal. The estimated ejection fraction was in the range of 55%    to 60%. Wall motion was normal; there were no regional wall    motion abnormalities. There was a reduced contribution of atrial    contraction to ventricular filling, due to increased ventricular    diastolic pressure or atrial contractile dysfunction. The study    is not technically sufficient to allow evaluation of LV diastolic    function. (suspect left atrial mechanical failure, possible    stunning after recent episode of atrial fibrillation versus    chronic atrial dysfunction)  - Left atrium: The atrium was mildly dilated.  - Right ventricle: The cavity size was mildly dilated. Wall    thickness was normal.  - Right atrium: The atrium was mildly dilated.  - Pulmonary arteries: Systolic pressure was moderately increased.    PA peak pressure: 60 mm Hg (S).    6./15/18: 48 hor holter Minimum HR: 37 BPM at 12:18:54 PM Maximum HR: 124 BPM at 7:11:26 PM Average HR: 50 BPM 1% ventricular beats 4% supraventricular beats Ventricular runs of 6 beats Supraventricular runs of 13 beats   Zero atrial fibrillaiton noted on monitor. Average HR 50. No med  changes at this time.  Will [name], MD   10/01/16 TTE - Left ventricle: The cavity size was normal. Systolic function was   normal. The estimated ejection fraction was in the range of 55%   to 60%. Wall motion was normal; there were no regional wall   motion abnormalities. Doppler parameters are consistent with a   reversible restrictive pattern, indicative of decreased left   ventricular diastolic compliance and/or increased left atrial   pressure (grade 3 diastolic dysfunction). Doppler parameters are   consistent with high ventricular  filling pressure. - Aortic valve: Transvalvular velocity was within the normal range.   There was no stenosis. - Mitral valve: Transvalvular velocity was within the normal range.   There was no evidence for stenosis. There was mild regurgitation. - Right ventricle: The cavity size was mildly dilated. Wall   thickness was normal. Systolic function was normal. - Atrial septum: No defect or patent foramen ovale was identified   by color flow Doppler. - Tricuspid valve: There was mild regurgitation. - Pulmonary arteries: Systolic pressure was moderately to severely   increased. PA peak pressure: 57 mm Hg (S). - Pericardium, extracardiac: A trivial pericardial effusion was   identified.   10/10/15: lexiscan stress myoview There was no ST segment deviation noted during stress. No T wave inversion was noted during stress. The study is normal. This is a low risk study.  Low risk stress nuclear study with normal perfusion. Non gated study.    Patient Profile     75 y.o. male AFib, HTN, CVA,  HLD, syncope attributed to orthostasis in 2017, h/o medication non complinace, another stroke Feb 2022 (compliant with his [place] at the time)  >> polycythemia admitted with recurrent near syncope, abdominal complaints and AFib w/RVR     AFib Hx Diagnosed 2017   AAD hx Started March 2019 >> seems self stopped sometime in the Fall 2021 2/2 making him feel cold and  lowered his pulse Toprol was stopped by the patient making him feel bad, poorly tolerated  Assessment & Plan    Paroxysmal Afib Baseline conduction system disease CHA2DS2Vasc is 5, on Eliquis RVR Rates had generally improved and hoped to be able to send him home though when mobilized HR climbed to the 200's, he was symptomatic with some heaviness in his chest and weak. Upon sitting/resting HR quickly recovered back to 110's  Tough case, we can not start Tikosyn yet given he was on chlorthalidone (last dose was 10/28/21) needs full 6 days wash out  I hesitate to advance rate controlling meds give baseline conduction system disease, will discuss with Dr. Quentin Ore further  I will update his echo No discharge today    2. Dizziness/near syncope This seems to be associated with difficult BMs/abdominal cramping Also is usually found in AFib w/RVR as well THIS AM as discussed in detail in earlier note from today he had persistent symptoms of feeling weak, near syncopeal with HR controlled and normotensive BPs, this associated with abdominal symptoms and sensation that he needed to have a BM Suspect to be a vagal symptom Planned for GI follow up outpt   3. Abdominal cramping 4. Constipation/diarrhea Appreciated GI recommendations D/w GI APP, no inpatient needs for barium swallow findings    For questions or updates, please contact [place] Please consult [email or web address] for contact info under        Signed, Baldwin Jamaica, PA-C  10/30/2021, 1:19 PM

## 2021-10-30 NOTE — Progress Notes (Signed)
   Patient Name: Patrick Roth Date of Encounter: 10/30/2021, 5:42 PM    Subjective  Asked to return re: chronic LLQ abdominal pain\ Seen in consult yesterday w/ plans for outpatient w/u of chronidc GI sxs through his GI physician at [facility] He had a ba swallow today w/ dysmotility seen  He has years "my whole life" of LLQ pain that is often felt in Am and he says causes BP to go up. Then he has formed stool and multiple others that are progressively looser. Again x years.Worsened since back injury. Says no Tx ever though has had CT scans and had a negative colonoscopy within past 3 years per him     Objective  BP 104/86 (BP Location: Left Arm)   Pulse 100   Temp 97.7 F (36.5 C) (Oral)   Resp (!) 23   Ht 5\' 8"  (1.727 m)   Wt 85.5 kg Comment: scale A  SpO2 100%   BMI 28.65 kg/m  NAD Abd sl protuberant soft and nontender    Assessment and Plan  Chronic LLQ pain and disordered defecation ? Dyssynergic defecation, IBS, pelvic floor dysfx  I explained that this will not be worked up here in hospital and reviewed recommendations from yesterday to see his primary GI physician foir additional evaluation.  In the meantime will try prn dicyclomine  Call us back if ?'s but really do not have other recommendations for this chronic problem  Gatha Mayer, MD, [facility] 10/30/2021 5:42 PM

## 2021-10-31 ENCOUNTER — Inpatient Hospital Stay (HOSPITAL_COMMUNITY): Payer: Medicare HMO

## 2021-10-31 ENCOUNTER — Other Ambulatory Visit (HOSPITAL_COMMUNITY): Payer: Medicare HMO

## 2021-10-31 DIAGNOSIS — K219 Gastro-esophageal reflux disease without esophagitis: Secondary | ICD-10-CM | POA: Diagnosis present

## 2021-10-31 DIAGNOSIS — M5416 Radiculopathy, lumbar region: Secondary | ICD-10-CM | POA: Diagnosis present

## 2021-10-31 DIAGNOSIS — Z8616 Personal history of COVID-19: Secondary | ICD-10-CM | POA: Diagnosis not present

## 2021-10-31 DIAGNOSIS — I480 Paroxysmal atrial fibrillation: Secondary | ICD-10-CM | POA: Diagnosis present

## 2021-10-31 DIAGNOSIS — Z888 Allergy status to other drugs, medicaments and biological substances status: Secondary | ICD-10-CM | POA: Diagnosis not present

## 2021-10-31 DIAGNOSIS — R000 Tachycardia, unspecified: Secondary | ICD-10-CM | POA: Diagnosis present

## 2021-10-31 DIAGNOSIS — I4892 Unspecified atrial flutter: Secondary | ICD-10-CM | POA: Diagnosis present

## 2021-10-31 DIAGNOSIS — Z79899 Other long term (current) drug therapy: Secondary | ICD-10-CM | POA: Diagnosis not present

## 2021-10-31 DIAGNOSIS — Z91048 Other nonmedicinal substance allergy status: Secondary | ICD-10-CM | POA: Diagnosis not present

## 2021-10-31 DIAGNOSIS — Z801 Family history of malignant neoplasm of trachea, bronchus and lung: Secondary | ICD-10-CM | POA: Diagnosis not present

## 2021-10-31 DIAGNOSIS — Z7901 Long term (current) use of anticoagulants: Secondary | ICD-10-CM | POA: Diagnosis not present

## 2021-10-31 DIAGNOSIS — R1032 Left lower quadrant pain: Secondary | ICD-10-CM | POA: Diagnosis present

## 2021-10-31 DIAGNOSIS — D751 Secondary polycythemia: Secondary | ICD-10-CM | POA: Diagnosis present

## 2021-10-31 DIAGNOSIS — K580 Irritable bowel syndrome with diarrhea: Secondary | ICD-10-CM | POA: Diagnosis present

## 2021-10-31 DIAGNOSIS — I459 Conduction disorder, unspecified: Secondary | ICD-10-CM | POA: Diagnosis present

## 2021-10-31 DIAGNOSIS — I4891 Unspecified atrial fibrillation: Secondary | ICD-10-CM | POA: Diagnosis present

## 2021-10-31 DIAGNOSIS — E782 Mixed hyperlipidemia: Secondary | ICD-10-CM | POA: Diagnosis present

## 2021-10-31 DIAGNOSIS — K594 Anal spasm: Secondary | ICD-10-CM | POA: Diagnosis present

## 2021-10-31 DIAGNOSIS — Z8719 Personal history of other diseases of the digestive system: Secondary | ICD-10-CM | POA: Diagnosis not present

## 2021-10-31 DIAGNOSIS — Z8249 Family history of ischemic heart disease and other diseases of the circulatory system: Secondary | ICD-10-CM | POA: Diagnosis not present

## 2021-10-31 DIAGNOSIS — E876 Hypokalemia: Secondary | ICD-10-CM | POA: Diagnosis present

## 2021-10-31 DIAGNOSIS — I10 Essential (primary) hypertension: Secondary | ICD-10-CM | POA: Diagnosis present

## 2021-10-31 DIAGNOSIS — R0602 Shortness of breath: Secondary | ICD-10-CM | POA: Diagnosis present

## 2021-10-31 DIAGNOSIS — Z8673 Personal history of transient ischemic attack (TIA), and cerebral infarction without residual deficits: Secondary | ICD-10-CM | POA: Diagnosis not present

## 2021-10-31 DIAGNOSIS — K224 Dyskinesia of esophagus: Secondary | ICD-10-CM | POA: Diagnosis present

## 2021-10-31 LAB — ECHOCARDIOGRAM COMPLETE
Height: 68 in
S' Lateral: 3.1 cm
Weight: 2998.26 oz

## 2021-10-31 MED ORDER — DILTIAZEM HCL 60 MG PO TABS
120.0000 mg | ORAL_TABLET | Freq: Four times a day (QID) | ORAL | Status: DC
  Administered 2021-10-31 – 2021-11-01 (×5): 120 mg via ORAL
  Filled 2021-10-31 (×5): qty 2

## 2021-10-31 NOTE — Progress Notes (Signed)
Progress Note  Patient Name: Patrick Roth Date of Encounter: 10/31/2021  [facility] Cardiologist: Mertie Moores, MD   Subjective   NAEO. HR better controlled this morning.  Inpatient Medications    Scheduled Meds:  apixaban  5 mg Oral BID   diltiazem  90 mg Oral Q6H   famotidine  20 mg Oral Daily   magnesium oxide  400 mg Oral BID   nicotine  14 mg Transdermal Daily   potassium chloride SA  20 mEq Oral BID PC   sodium chloride flush  3 mL Intravenous Q12H   sodium phosphate  1 enema Rectal Once   Continuous Infusions:  PRN Meds: acetaminophen **OR** acetaminophen, dicyclomine, hydrALAZINE, metoprolol tartrate, oxyCODONE-acetaminophen, polyethylene glycol, psyllium   Vital Signs    Vitals:   10/30/21 1800 10/30/21 1954 10/30/21 2310 10/31/21 0347  BP: 114/88 110/88 111/85 104/88  Pulse: (!) 107 100 100 100  Resp: (!) 21 17 19 18   Temp:  97.9 F (36.6 C) 98.2 F (36.8 C) 97.7 F (36.5 C)  TempSrc:  Oral Oral Oral  SpO2: 100% 99% 100% 99%  Weight:    85 kg  Height:        Intake/Output Summary (Last 24 hours) at 10/31/2021 0752 Last data filed at 10/31/2021 0300 Gross per 24 hour  Intake 470 ml  Output 790 ml  Net -320 ml    Last 3 Weights 10/31/2021 10/30/2021 10/29/2021  Weight (lbs) 187 lb 6.3 oz 188 lb 6.4 oz 192 lb 14.4 oz  Weight (kg) 85 kg 85.458 kg 87.5 kg      Telemetry    Telemetry 80's-110s. I do not see any sustained rates that are significantly elevated. - Personally Reviewed  ECG    No new EKGs - Personally Reviewed  Physical Exam   GEN: No acute distress.   Neck: No JVD Cardiac: irreg-irreg, no murmurs, rubs, or gallops.  Respiratory: CTA b/l. GI: Soft, nontender, non-distended  MS: No edema; No deformity. Neuro:  Nonfocal  Psych: Normal affect   Labs    High Sensitivity Troponin:   Recent Labs  Lab 10/05/21 0442 10/27/21 1116 10/27/21 1315 10/28/21 1030 10/28/21 1215  TROPONINIHS 18* 13 16 15  18*       Chemistry Recent Labs  Lab 10/27/21 1116 10/28/21 1030 10/29/21 0133  NA 138 141 134*  K 3.1* 2.9* 3.5  CL 99 100 100  CO2 28 26 27   GLUCOSE 116* 108* 93  BUN 15 17 12   CREATININE 1.31* 1.25* 1.15  CALCIUM 9.1 10.2 8.9  MG 1.7 1.7  --   PROT 7.2 7.4  --   ALBUMIN 3.7 4.3  --   AST 26 22  --   ALT 20 21  --   ALKPHOS 62 64  --   BILITOT 2.3* 1.9*  --   GFRNONAA 56* 60* >60  ANIONGAP 11 15 7      Lipids No results for input(s): CHOL, TRIG, HDL, LABVLDL, LDLCALC, CHOLHDL in the last 168 hours.  Hematology Recent Labs  Lab 10/27/21 1116 10/28/21 1030 10/29/21 0133  WBC 6.2 6.7 7.0  RBC 5.69 5.65 5.36  HGB 16.7 16.3 15.6  HCT 50.2 50.4 47.7  MCV 88.2 89.2 89.0  MCH 29.3 28.8 29.1  MCHC 33.3 32.3 32.7  RDW 13.7 14.1 14.0  PLT 199 196 203    Thyroid No results for input(s): TSH, FREET4 in the last 168 hours.  BNP Recent Labs  Lab 10/27/21 1116 10/28/21 1030  BNP 133.7* 116.2*     DDimer No results for input(s): DDIMER in the last 168 hours.   Radiology    DG ESOPHAGUS W SINGLE CM (SOL OR THIN BA)  Result Date: 10/30/2021 CLINICAL DATA:  75 year old male with history of dysphagia. Sensation of something stuck in the throat. EXAM: ESOPHOGRAM/BARIUM SWALLOW TECHNIQUE: Single contrast examination was performed using  thin barium. FLUOROSCOPY TIME:  Fluoroscopy Time:  1.0 Radiation Exposure Index (if provided by the fluoroscopic device): 7.2 Number of Acquired Spot Images: 0 COMPARISON:  None. FINDINGS: Within the limitations of today's single contrast examination, there was no esophageal mass, stricture or esophageal ring. Small hiatal hernia. Within the hiatal hernia there is a focal outpouching, compatible with a small diverticulum. Esophageal motility was grossly abnormal with failure to propagate normal primary peristaltic waves. Extensive tertiary contractions were also observed. IMPRESSION: 1. Nonspecific esophageal motility disorder with extensive tertiary  contractions. 2. Small hiatal hernia. Small diverticulum noted within the hiatal hernia. Electronically Signed   By: Vinnie Langton M.D.   On: 10/30/2021 08:47    Cardiac Studies  No new    Patient Profile     75 y.o. male AFib, HTN, CVA,  HLD, syncope attributed to orthostasis in 2017, another stroke Feb 2022 (compliant with his [place] at the time)  >> polycythemia admitted with recurrent near syncope, abdominal complaints and AFib w/RVR  Assessment & Plan   #pAF Rate control is improving. Has baseline conduction disease so IC agents are not options. Planning for dofetilide once chlorthalidone has washed out. - cont CCB with uptitration on today's dose - tentative discharge tomorrow if rates continue to improve  #Abdominal Complaints/Cramping/Constipation Outpatient GI follow up    For questions or updates, please contact [place] Please consult [email or web address] for contact info under        Signed, Vickie Epley, MD  10/31/2021, 7:52 AM

## 2021-10-31 NOTE — Progress Notes (Signed)
During mobilization in room Pt's HR as high as 209.  Reviewed with CCMD and strip saved.  PT asymptomatic.  Pt HR returned to baseline after sitting down in chair.  MD notified.

## 2021-10-31 NOTE — Progress Notes (Signed)
  Echocardiogram 2D Echocardiogram has been performed.  Patrick Roth 10/31/2021, 3:23 PM

## 2021-10-31 NOTE — Progress Notes (Addendum)
Patient having brief periods of sinus bradycardia, last episode lasting from 2244 - 2300. Gaynelle Arabian, MD states okay to give scheduled Cardizem.

## 2021-10-31 NOTE — Progress Notes (Signed)
Patients heart rate elevated to 210 while ambulating to restroom, patient assisted back to bed and HR returned to 90's NSR at rest.

## 2021-11-01 ENCOUNTER — Other Ambulatory Visit: Payer: Self-pay | Admitting: Physician Assistant

## 2021-11-01 ENCOUNTER — Encounter (HOSPITAL_COMMUNITY): Payer: Self-pay | Admitting: Internal Medicine

## 2021-11-01 DIAGNOSIS — K219 Gastro-esophageal reflux disease without esophagitis: Secondary | ICD-10-CM

## 2021-11-01 DIAGNOSIS — E876 Hypokalemia: Secondary | ICD-10-CM

## 2021-11-01 DIAGNOSIS — K5902 Outlet dysfunction constipation: Secondary | ICD-10-CM

## 2021-11-01 MED ORDER — DILTIAZEM HCL 120 MG PO TABS: 120.0000 mg | ORAL_TABLET | Freq: Four times a day (QID) | ORAL | 11 refills | Status: DC

## 2021-11-01 MED ORDER — FAMOTIDINE 20 MG PO TABS: 20.0000 mg | ORAL_TABLET | Freq: Every day | ORAL | 0 refills | Status: DC

## 2021-11-01 MED ORDER — DICYCLOMINE HCL 20 MG PO TABS: 20.0000 mg | ORAL_TABLET | Freq: Four times a day (QID) | ORAL | 0 refills | Status: DC | PRN

## 2021-11-01 MED ORDER — POTASSIUM CHLORIDE CRYS ER 20 MEQ PO TBCR: 20.0000 meq | EXTENDED_RELEASE_TABLET | Freq: Two times a day (BID) | ORAL | Status: DC

## 2021-11-01 NOTE — Discharge Summary (Addendum)
Discharge Summary    Patient ID: Patrick Roth MRN: [id number]; DOB: October 10, 1946  Admit date: 10/28/2021 Discharge date: 11/01/2021  PCP:  [name], PA-C   [facility] Providers Cardiologist:  Mertie Moores, MD  Electrophysiologist:  Constance Haw, MD       Discharge Diagnoses    Principal Problem:   Atrial fibrillation with rapid ventricular response [facility]) Active Problems:   Essential hypertension   Wide-complex tachycardia   Mixed hyperlipidemia   Rectal sphincter spasm   Dyssynergic defecation   GERD (gastroesophageal reflux disease)    Diagnostic Studies/Procedures    Echocardiogram 10/31/21  1. Inferior basal hypokinesis . Left ventricular ejection fraction, by  estimation, is 50 to 55%. The left ventricle has low normal function. The  left ventricle demonstrates regional wall motion abnormalities (see  scoring diagram/findings for  description). There is mild left ventricular hypertrophy. Left ventricular  diastolic parameters were normal.   2. Right ventricular systolic function is normal. The right ventricular  size is normal. There is normal pulmonary artery systolic pressure.   3. Left atrial size was mildly dilated.   4. The mitral valve is degenerative. Mild mitral valve regurgitation. No  evidence of mitral stenosis. Moderate mitral annular calcification.   5. The aortic valve is tricuspid. Aortic valve regurgitation is not  visualized. Aortic valve sclerosis/calcification is present, without any  evidence of aortic stenosis.   6. The inferior vena cava is normal in size with greater than 50%  respiratory variability, suggesting right atrial pressure of 3 mmHg.  _____________   History of Present Illness     Patrick Roth is a 75 y.o. male with atrial fibrillation (intol of Amiodarone, beta blocker; has declined PVI ablation, AVN ablation and pacer in the past; Zio monitor in 11/21: 100% AFib), hypertension, prior stroke with  recurrent stroke in 12/2020, hyperlipidemia, syncope attributed to orthostasis in 2017, polycythemia, COVID-19 in 08/2021.  He has had several visits to the ED with near syncope and rapid ventricular rate.  His K+ and Mg2+ were low on his most recent visit.  He was last seen in clinic with Tommye Standard, PA-C 10/05/21.  He presented to the ED on 10/28/2021 with near syncope, shortness of breath, diarrhea and abdominal pain.  He was noted to have rapid rate with wide complex tachycardia (1 run over 200) and was admitted for further evaluation and management.    Hospital Course     Consultants: Electrophysiology; Gastroenterology    He was admitted for evaluation and management.  He was seen by the EP service.  Dr. Rayann Heman saw him on 10/29/21 and antiarrhythmic drug therapy with Dofetilide was again recommended.  The patient was agreeable to this.  Given his hx of hypokalemia and hypomagnesemia (K+ 2.9 and Mg2+ 1.7 on admit), his chlorthalidone was stopped.  He will require adequate washout of Chlorthalidone (6 days) prior to attempting Dofetilide load.  He was started on Diltiazem every 6 hours to help with HR.  He continued to have episodes of near syncope and rapid rates.  The patient is at high risk of developing symptomatic bradycardia/heart block given baseline trifascicular block.  His Diltiazem was uptitrated more to achieve adequate rate control.  His HR improved on higher dose calcium channel blocker.    He was seen by GI on 10/29/21 for evaluation of his abdominal and bowel complaints.  There was some concern about vagal reactions contributing to his symptoms of near syncope.  He was seen by Dr.  Candis Schatz who felt that his hx was most c/w dyssynergic defecation. Reduced straining was recommended with daily fiber supplement (Citrucel).  He would also need an outpatient anorectal manometry and pelvic floor physical therapy (to be coordinated with his primary GI).  The patient also had symptoms of a globus  sensation and daily famotidine 20 mg was recommended for 4 weeks.  He would need an outpatient EGD if there was no improvement with this treatment.  He was seen again by GI (Dr. Carlean Purl) 12/9 due to chronic LLQ abdominal pain.  He was started on prn dicyclomine with continued plans for OP evaluation.    He was evaluated by Dr. Quentin Ore this morning and noted to have improved HR control.  He dose still have episodes of fast HRs with activity and HRs in the 40-50s at rest.  Overall, he is felt to be stable for DC to home.  He will go home on Diltiazem 120 mg every 6 hours.  We had planned to send him home on Metoprolol 25 mg twice daily but he has not received any metoprolol in several days (ordered as prn med). Therefore, he will not go home on scheduled Metoprolol tartrate.  He still has metoprolol at home to take for HR greater than 120.  He already had f/u with Tommye Standard, PA-C in clinic later this week.  He will go home on K+ 20 mEq twice daily.  He will need a repeat BMET at his f/u OV.    Did the patient have an acute coronary syndrome (MI, NSTEMI, STEMI, etc) this admission?:  No                               Did the patient have a percutaneous coronary intervention (stent / angioplasty)?:  No.      _____________  Discharge Vitals Blood pressure 108/80, pulse 98, temperature 97.9 F (36.6 C), temperature source Oral, resp. rate 18, height 5\' 8"  (1.727 m), weight 85.4 kg, SpO2 97 %.  Filed Weights   10/30/21 0439 10/31/21 0347 11/01/21 0322  Weight: 85.5 kg 85 kg 85.4 kg    Labs & Radiologic Studies    CBC Lab Results  Component Value Date   WBC 7.0 10/29/2021   HGB 15.6 10/29/2021   HCT 47.7 10/29/2021   MCV 89.0 10/29/2021   PLT 203 28/41/3244    Basic Metabolic Panel Lab Results  Component Value Date   NA 134 (L) 10/29/2021   K 3.5 10/29/2021   CO2 27 10/29/2021   GLUCOSE 93 10/29/2021   BUN 12 10/29/2021   CREATININE 1.15 10/29/2021   CALCIUM 8.9 10/29/2021   GFRNONAA  >60 10/29/2021    Recent Labs    10/28/21 1030  MG 1.7    Recent Labs    10/28/21 1030  AST 22  ALT 21  ALKPHOS 64  BILITOT 1.9*  PROT 7.4  ALBUMIN 4.3     High Sensitivity Troponin:   Recent Labs  Lab 10/05/21 0442 10/27/21 1116 10/27/21 1315 10/28/21 1030 10/28/21 1215  TROPONINIHS 18* 13 16 15  18*    BNP Recent Labs    10/28/21 1030  BNP 116.2*    _____________   DG Chest Port 1 View  Result Date: 10/28/2021 CLINICAL DATA:  Shortness of breath.  EXAM: PORTABLE CHEST 1 VIEW COMPARISON:  Chest radiograph 10/27/2021  FINDINGS: The cardiomediastinal silhouette is within normal limits. Lung volumes are increased compared to the  prior study. No airspace consolidation, edema, pleural effusion, or pneumothorax is identified. Telemetry leads overlie the chest. No acute osseous abnormality is seen.  IMPRESSION:  No active disease.  Electronically Signed   By: Logan Bores M.D.   On: 10/28/2021 10:40   DG Chest Port 1 View  Result Date: 10/27/2021 CLINICAL DATA:  Shortness of breath  EXAM: PORTABLE CHEST 1 VIEW COMPARISON:  10/04/2021  FINDINGS: The heart size and mediastinal contours are within normal limits. Both lungs are clear. The visualized skeletal structures are unremarkable.  IMPRESSION: No active disease.  Electronically Signed   By: Elmer Picker M.D.   On: 10/27/2021 11:44    DG ESOPHAGUS W SINGLE CM (SOL OR THIN BA) Result Date: 10/30/2021 CLINICAL DATA:  75 year old male with history of dysphagia. Sensation of something stuck in the throat.  EXAM: ESOPHOGRAM/BARIUM SWALLOW TECHNIQUE: Single contrast examination was performed using  thin barium. FLUOROSCOPY TIME:  Fluoroscopy Time:  1.0 Radiation Exposure Index (if provided by the fluoroscopic device): 7.2 Number of Acquired Spot Images: 0 COMPARISON:  None.  FINDINGS: Within the limitations of today's single contrast examination, there was no esophageal mass, stricture or esophageal ring. Small hiatal  hernia. Within the hiatal hernia there is a focal outpouching, compatible with a small diverticulum. Esophageal motility was grossly abnormal with failure to propagate normal primary peristaltic waves. Extensive tertiary contractions were also observed.  IMPRESSION:  1. Nonspecific esophageal motility disorder with extensive tertiary contractions.  2. Small hiatal hernia. Small diverticulum noted within the hiatal hernia.  Electronically Signed   By: Vinnie Langton M.D.   On: 10/30/2021 08:47     Disposition   Pt is being discharged home today in good condition.  Follow-up Plans & Appointments     Follow-up Information     [facility] Follow up.   Specialty: Cardiology Why: 11/10/21 @ 11:00AM with Maximino Greenland, NP, for admission to start the new medication Contact information: [address] [phone] [place], [place] Follow up.   Why: Please call and make follow up appointment with your gastroenterologist.  You should be seen in the next 2 weeks if able Contact information: [place] [address] [phone]         Baldwin Jamaica, PA-C Follow up on 11/03/2021.   Specialty: Cardiology Why: 10:05 am Contact information: [place] [address] [phone]                Discharge Instructions     Diet - low sodium heart healthy   Complete by: As directed    Increase activity slowly   Complete by: As directed    No wound care   Complete by: As directed        Discharge Medications   Allergies as of 11/01/2021       Reactions   Crestor [rosuvastatin Calcium] Shortness Of Breath   Muscle cramps   Spironolactone Shortness Of Breath   Terazosin Shortness Of Breath, Palpitations, Other (See Comments)   Nerve pain   Amiodarone Other (See Comments)   Dropped pulse real low to 45-50, and made him feel very cold    Atenolol Other (See Comments)   Drowsiness and "flu sxs"   Bystolic [nebivolol Hcl] Other (See Comments)   GI issues   Hydralazine Other (See Comments)   Joint swelling   Tizanidine Other (  See Comments)   Syncope, Elevated BP/pulse   Amlodipine Other (See Comments)   Clonidine Other (See Comments)   Hydrocodone-acetaminophen Other (See Comments)   Levofloxacin Other (See Comments)   Losartan Other (See Comments)   Did not work.  Pt states he couldn't take b/c of SE, but can't remember what SE were.   Maxzide [hydrochlorothiazide W-triamterene] Other (See Comments)   Does not tolerate potassium-sparing diuretics   Olmesartan Other (See Comments)   Tape Other (See Comments)   Other reaction(s): Unknown   Tussionex Pennkinetic Er [hydrocod Polst-cpm Polst Er] Other (See Comments)   Other reaction(s): Unknown        Medication List     STOP taking these medications    amLODipine 10 MG tablet Commonly known as: NORVASC   chlorthalidone 25 MG tablet Commonly known as: HYGROTON       TAKE these medications    acetaminophen 650 MG CR tablet Commonly known as: TYLENOL Take 1,300 mg by mouth every 8 (eight) hours as needed for pain.   apixaban 5 MG Tabs tablet Commonly known as: Eliquis Take 1 tablet (5 mg total) by mouth 2 (two) times daily.   dicyclomine 20 MG tablet Commonly known as: BENTYL Take 1 tablet (20 mg total) by mouth every 6 (six) hours as needed for spasms.   diltiazem 120 MG tablet Commonly known as: CARDIZEM Take 1 tablet (120 mg total) by mouth every 6 (six) hours.   famotidine 20 MG tablet Commonly known as: PEPCID Take 1 tablet (20 mg total) by mouth daily.   Magnesium Oxide 400 MG Caps Take 1 capsule (400 mg total) by mouth 2 (two) times daily.   metoprolol tartrate 25 MG tablet Commonly known as: LOPRESSOR Take 1 tablet (25 mg total) by mouth 2 (two) times daily as needed (FOR HEARTRATE HIGHER THAN 120 bpm).   oxyCODONE-acetaminophen  5-325 MG tablet Commonly known as: PERCOCET/ROXICET Take 1 tablet by mouth every 4 (four) hours as needed for severe pain.   polyethylene glycol powder 17 GM/SCOOP powder Commonly known as: GLYCOLAX/MIRALAX Take 17 g by mouth in the morning and at bedtime. Start with 1 scoop 2 times per day until bowels are moving, then reduce to 1 scoop daily. Drink 64oz of water daily.   potassium chloride SA 20 MEQ tablet Commonly known as: KLOR-CON M Take 1 tablet (20 mEq total) by mouth 2 (two) times daily after a meal. What changed: See the new instructions.   psyllium 58.6 % powder Commonly known as: METAMUCIL Take 1 packet by mouth daily as needed (constipation).   Repatha SureClick 237 MG/ML Soaj Generic drug: Evolocumab Inject 1 pen into the skin every 14 (fourteen) days.          Outstanding Labs/Studies   BMET at f/u appointment (Pt no longer on chlorthalidone.  Still on K+ supplement)  Duration of Discharge Encounter  Greater than 30 minutes including physician time.  Signed, Richardson Dopp, PA-C 11/01/2021, 10:17 AM

## 2021-11-01 NOTE — Progress Notes (Signed)
Pt discharged to home with family.  Pt's IV removed.  Pt taken off telemetry and CCMD notified.  Pt left with all of their personal belongings.  AVS documentation reviewed and sent home with Pt and all questions answered.   

## 2021-11-01 NOTE — Progress Notes (Signed)
Cardiology Office Note Date:  11/01/2021  Patient ID:  Patrick Roth, Patrick Roth Nov 18, 1946, MRN [id number] PCP:  [name], PA-C  Cardiologist:  Dr. Acie Fredrickson Electrophysiologist: Dr. [name]    Chief Complaint:  post hospital  History of Present Illness: Patrick Roth is a 75 y.o. male with history of  AFib, HTN, CVA,  HLD, syncope attributed to orthostasis in 2017, h/o medication non complinace, another stroke Feb 2022 (compliant with his [place] at the time)  >> polycythemia  He had an ER visit Sep 13, 2021, fever/chills, URI symptoms, COVID + Another ER visit 09/30/21 for recurrent dizziness, near syncope.  He was suspect to have a vagal episode associate with using the bathroom, though was found in Afib that was fast.  He was given IVF and lopressor with reported improved HR discharged from the ER NOTED there that metoprolol on his med list though pt was unfamiliar with the medicine, ? Not taking Back in the ER 10/04/21 Woke up as usual walked into the kitchen and started to feel weak, lightheaded and poorly again. This seems to always to some degree associated with some abdominal symptoms (he is struggling with constipation of sorts lately), he sat down and did feel a little better but when standing up again felt poorly. He did not have a sense of his Afib or racing but something "off" with his heart beat. Yesterday his HR was reported > 200, one of his EKG noted with rapid Afib 236bpm and was cardioverted (2 shocks required His last EKG yesterday was SB 54bpm   10/04/21 evening he was getting ready for bed and started feeling lightheaded again, He sat down and did feel better, but had an uneasy sense and his wife called EMS EMS tracings SR, PACs, one strip had a couple single PVCs, rates 70's EKG at the ER SR 61bpm    I saw him 10/05/21, unclear that with his baseline conductions sytem disease leary of rate controlling drigs, unclear is his symptoms of near syncope/weakness were RVR  (likely) though planned for monitoring to look for bradycardia as well. We discussed Tikosyn, PVI ablation and pace/ablate strategies, he wanted to think about it and follow up.   He had an ER visit 12/6, with near syncope associated with straining to have a BM, his HR went up and he took a couple doses of PRN lopressor went to the ER he was in AFib 120s, his HR settled to 90's-110's, he was feeling well and sent home He returned 12/7 and was admitted this time though it seems for diarrhea, not near syncope He was in AFib w/RVR and rates getting to 200's, he got lopressor in the ER with improvement in his HRs, admitted for further management   He was seen by GI who recommended outpt follow up for  Dyssynergic defecation - Outpatient anorectal manometry, pelvic floor physical therapy, can be coordinated by primary GI as outpatient - Daily Citrucel   Globus sensation - Pepcid 20 mg PO daily for 4 weeks - Outpatient EGD with primary GI as previously planned  Had an abnormal barium swallow to follow put pt as well for this  During his stay he had recurrent symptoms of weakness and feeling near faint, these occurred at rest with GI symptoms and normal HRs/BP AND with exertion and RVR to 200s  His chlorthalidone stopped with plans to admit 12/20 for Tikosyn, and started on diltiazem, titrated and discharged 11/01/21 with improved HR control with plans to keep  his f/u for repeat BMET, started on K+ supplementation  His symptoms felt to be multifactorial with RVR and GI/vagal  TODAY He is doing OK, his HR has been good, his back/hip though are hurting (a known problem for him) No near fainting spells, no palpitations, no CP No bleeding or signs of bleeding  He has the AFib clinic and admission instructions He confirms he is NOT taking chlorthalidone He confirms his IS taking his eliquis without missed doses  He was planned to get an EGD soon, he will need to wait until after his Tikosyn  admission for 1 month of uninterrupted a/c, I discussed this with him.     AFib Hx Diagnosed 2017   AAD hx Started March 2019 >> seems self stopped sometime in the Fall 2021 2/2 making him feel cold and lowered his pulse Toprol was stopped by the patient making him feel bad, poorly tolerated  Past Medical History:  Diagnosis Date   Atrial fibrillation/flutter    Intol of amio, beta blocker // Echo 12/22: inf HK, EF 50-55, normal RVSF, mild LaE, mild MR, mod MAC, AV sclerosis w/o aS   Colon polyps    Constipation 05/27/2015   DOE (dyspnea on exertion) 10/13/2015   Faintness 06/20/2016   History of chicken pox    Hyperlipidemia    Hypertension    Internal hemorrhoids    Polycythemia    Sciatica    Stroke ([place])    Hx of stroke in2008; recurrent CVA in 12/2020   Syncope and collapse 06/28/2016    Past Surgical History:  Procedure Laterality Date   CARDIOVERSION N/A 09/02/2017   Procedure: CARDIOVERSION;  Surgeon: [name], MD;  Location[facility] ENDOSCOPY;  Service: Cardiovascular;  Laterality: N/A;   COLONOSCOPY  2011   great toe surgery     PROSTATE BIOPSY      No current facility-administered medications for this visit.   Current Outpatient Medications  Medication Sig Dispense Refill   dicyclomine (BENTYL) 20 MG tablet Take 1 tablet (20 mg total) by mouth every 6 (six) hours as needed for spasms. 30 tablet 0   diltiazem (CARDIZEM) 120 MG tablet Take 1 tablet (120 mg total) by mouth every 6 (six) hours. 120 tablet 11   famotidine (PEPCID) 20 MG tablet Take 1 tablet (20 mg total) by mouth daily. 30 tablet 0   potassium chloride SA (KLOR-CON M) 20 MEQ tablet Take 1 tablet (20 mEq total) by mouth 2 (two) times daily after a meal.     Facility-Administered Medications Ordered in Other Visits  Medication Dose Route Frequency Provider Last Rate Last Admin   acetaminophen (TYLENOL) tablet 650 mg  650 mg Oral Q6H PRN Karmen Bongo, MD   650 mg at 10/29/21 1424   Or    acetaminophen (TYLENOL) suppository 650 mg  650 mg Rectal Q6H PRN Karmen Bongo, MD       apixaban Arne Cleveland) tablet 5 mg  5 mg Oral BID Karmen Bongo, MD   5 mg at 11/01/21 1145   dicyclomine (BENTYL) tablet 20 mg  20 mg Oral Q6H PRN Gatha Mayer, MD   20 mg at 10/31/21 1140   diltiazem (CARDIZEM) tablet 120 mg  120 mg Oral Q6H Lars Mage T, MD   120 mg at 11/01/21 1149   famotidine (PEPCID) tablet 20 mg  20 mg Oral Daily Dustin Flock E, MD   20 mg at 11/01/21 1148   hydrALAZINE (APRESOLINE) injection 5 mg  5 mg Intravenous Q4H  PRN Karmen Bongo, MD       magnesium oxide (MAG-OX) tablet 400 mg  400 mg Oral BID Karmen Bongo, MD   400 mg at 11/01/21 1145   metoprolol tartrate (LOPRESSOR) tablet 25 mg  25 mg Oral BID PRN Karmen Bongo, MD       nicotine (NICODERM CQ - dosed in mg/24 hours) patch 14 mg  14 mg Transdermal Daily Karmen Bongo, MD       oxyCODONE-acetaminophen (PERCOCET/ROXICET) 5-325 MG per tablet 1 tablet  1 tablet Oral Q4H PRN Karmen Bongo, MD   1 tablet at 10/30/21 0447   polyethylene glycol (MIRALAX / GLYCOLAX) packet 17 g  17 g Oral BID PRN Karmen Bongo, MD   17 g at 10/31/21 2345   potassium chloride SA (KLOR-CON M) CR tablet 20 mEq  20 mEq Oral BID Loletta Parish, MD   20 mEq at 11/01/21 1145   psyllium (HYDROCIL/METAMUCIL) 1 packet  1 packet Oral Daily PRN Karmen Bongo, MD       sodium chloride flush (NS) 0.9 % injection 3 mL  3 mL Intravenous Lillia Mountain, MD   3 mL at 11/01/21 1150   sodium phosphate (FLEET) 7-19 GM/118ML enema 1 enema  1 enema Rectal Once Karmen Bongo, MD        Allergies:   Crestor [rosuvastatin calcium], Spironolactone, Terazosin, Amiodarone, Atenolol, Bystolic [nebivolol hcl], Hydralazine, Tizanidine, Amlodipine, Clonidine, Hydrocodone-acetaminophen, Levofloxacin, Losartan, Maxzide [hydrochlorothiazide w-triamterene], Olmesartan, Tape, and Tussionex pennkinetic er [hydrocod polst-cpm polst er]   Social  History:  The patient  reports that he has never smoked. He has never used smokeless tobacco. He reports that he does not drink alcohol and does not use drugs.   Family History:  The patient's family history includes Healthy in his brother and sister; Heart disease in his brother and father; Hypertension (age of onset: 41) in his father; Lung cancer (age of onset: 34) in his mother; Lupus in his daughter; Other in his daughter.  ROS:  Please see the history of present illness.    All other systems are reviewed and otherwise negative.   PHYSICAL EXAM:  VS:  There were no vitals taken for this visit. BMI: There is no height or weight on file to calculate BMI. Well nourished, well developed, in no acute distress HEENT: normocephalic, atraumatic Neck: no JVD, carotid bruits or masses Cardiac:  RRR; no significant murmurs, no rubs, or gallops Lungs:  CTA b/l, no wheezing, rhonchi or rales Abd: soft, nontender MS: no deformity or atrophy Ext: no edema Skin: warm and dry, no rash Neuro:  No gross deficits appreciated Psych: euthymic mood, full affect   EKG:  done today and reviewed by myself and with Dr. Lovena Le Aflutter (atypical), 52bpm, RBBB, LAD   10/31/21: TTE IMPRESSIONS   1. Inferior basal hypokinesis . Left ventricular ejection fraction, by  estimation, is 50 to 55%. The left ventricle has low normal function. The  left ventricle demonstrates regional wall motion abnormalities (see  scoring diagram/findings for  description). There is mild left ventricular hypertrophy. Left ventricular  diastolic parameters were normal.   2. Right ventricular systolic function is normal. The right ventricular  size is normal. There is normal pulmonary artery systolic pressure.   3. Left atrial size was mildly dilated.   4. The mitral valve is degenerative. Mild mitral valve regurgitation. No  evidence of mitral stenosis. Moderate mitral annular calcification.   5. The aortic valve is tricuspid.  Aortic valve regurgitation is not  visualized. Aortic valve sclerosis/calcification is present, without any  evidence of aortic stenosis.   6. The inferior vena cava is normal in size with greater than 50%  respiratory variability, suggesting right atrial pressure of 3 mmHg.   Nov 2022, monitor (7 days) Predominant rhythm was sinus rhythm 1% ventricular ectopy 6.2% supraventricular ectopy Multiple SVT episodes, all less than 20 seconds No symptoms recorded     Nov 2021 Monitoring Max 215 bpm 02:28pm, 10/28 Min 82 bpm 11:16am, 10/28 Avg 112 bpm 3.3% PVC burden, rare supraventricular ectopy Atrial fibrillation/flutter occurred 100% of the time Longest ventricular run 10 seconds at 161 bpm No symptoms recorded       02/24/2018: stress myoview Nuclear stress EF: 58%. Normal perfusion. No ischemia This is a low risk study.       02/24/2018: TTE Study Conclusions  - Left ventricle: The cavity size was normal. Systolic function was    normal. The estimated ejection fraction was in the range of 55%    to 60%. Wall motion was normal; there were no regional wall    motion abnormalities. There was a reduced contribution of atrial    contraction to ventricular filling, due to increased ventricular    diastolic pressure or atrial contractile dysfunction. The study    is not technically sufficient to allow evaluation of LV diastolic    function. (suspect left atrial mechanical failure, possible    stunning after recent episode of atrial fibrillation versus    chronic atrial dysfunction)  - Left atrium: The atrium was mildly dilated.  - Right ventricle: The cavity size was mildly dilated. Wall    thickness was normal.  - Right atrium: The atrium was mildly dilated.  - Pulmonary arteries: Systolic pressure was moderately increased.    PA peak pressure: 60 mm Hg (S).    6./15/18: 48 hor holter Minimum HR: 37 BPM at 12:18:54 PM Maximum HR: 124 BPM at 7:11:26 PM Average HR: 50 BPM 1%  ventricular beats 4% supraventricular beats Ventricular runs of 6 beats Supraventricular runs of 13 beats   Zero atrial fibrillaiton noted on monitor. Average HR 50. No med changes at this time.  Will [name], MD   10/01/16 TTE - Left ventricle: The cavity size was normal. Systolic function was   normal. The estimated ejection fraction was in the range of 55%   to 60%. Wall motion was normal; there were no regional wall   motion abnormalities. Doppler parameters are consistent with a   reversible restrictive pattern, indicative of decreased left   ventricular diastolic compliance and/or increased left atrial   pressure (grade 3 diastolic dysfunction). Doppler parameters are   consistent with high ventricular filling pressure. - Aortic valve: Transvalvular velocity was within the normal range.   There was no stenosis. - Mitral valve: Transvalvular velocity was within the normal range.   There was no evidence for stenosis. There was mild regurgitation. - Right ventricle: The cavity size was mildly dilated. Wall   thickness was normal. Systolic function was normal. - Atrial septum: No defect or patent foramen ovale was identified   by color flow Doppler. - Tricuspid valve: There was mild regurgitation. - Pulmonary arteries: Systolic pressure was moderately to severely   increased. PA peak pressure: 57 mm Hg (S). - Pericardium, extracardiac: A trivial pericardial effusion was   identified.   10/10/15: lexiscan stress myoview There was no ST segment deviation noted during stress. No T wave inversion was noted during stress. The study is normal.  This is a low risk study.  Low risk stress nuclear study with normal perfusion. Non gated study.  Recent Labs: 10/04/2021: TSH 2.485 10/28/2021: ALT 21; B Natriuretic Peptide 116.2; Magnesium 1.7 10/29/2021: BUN 12; Creatinine, Ser 1.15; Hemoglobin 15.6; Platelets 203; Potassium 3.5; Sodium 134  01/17/2021: VLDL 11 04/13/2021: Chol/HDL Ratio  2.8; Cholesterol, Total 154; HDL 55; LDL Chol Calc (NIH) 83; Triglycerides 87   Estimated Creatinine Clearance: 58.1 mL/min (by C-G formula based on SCr of 1.15 mg/dL).   Wt Readings from Last 3 Encounters:  11/01/21 188 lb 3.2 oz (85.4 kg)  10/27/21 197 lb (89.4 kg)  10/19/21 197 lb (89.4 kg)     Other studies reviewed: Additional studies/records reviewed today include: summarized above  ASSESSMENT AND PLAN:  Paroxysmal AFib CHA2DS2Vasc is 5, on Eliquis, appropriately dosed planned for Tikosyn admission after chlorthalidone washout, scheduled for 12/20 He confirms no missed doses of Eliquis leading into or since his hospital admission  He confirms he has not taken chlorthalidone  EKG is reviewed with Dr. Lovena Le, he did not think was CHB  HTN Looks ok  GI  Hold off EGD until tikosyn load and uninterrupted [place] [T is aware  Disposition: F/u with Post Tikosyn follow up  Current medicines are reviewed at length with the patient today.  The patient did not have any concerns regarding medicines.  Venetia Night, PA-C 11/01/2021 12:52 PM     [address] [place] [phone] (office)  [phone] (fax)

## 2021-11-01 NOTE — Progress Notes (Addendum)
Progress Note  Patient Name: Patrick Roth Date of Encounter: 11/01/2021  [facility] Cardiologist: Mertie Moores, MD   Subjective   NAEO. HR better controlled this morning. Has ambulated without difficulty. Thinks he is ready to go home today.  Inpatient Medications    Scheduled Meds:  apixaban  5 mg Oral BID   diltiazem  120 mg Oral Q6H   famotidine  20 mg Oral Daily   magnesium oxide  400 mg Oral BID   nicotine  14 mg Transdermal Daily   potassium chloride SA  20 mEq Oral BID PC   sodium chloride flush  3 mL Intravenous Q12H   sodium phosphate  1 enema Rectal Once   Continuous Infusions:  PRN Meds: acetaminophen **OR** acetaminophen, dicyclomine, hydrALAZINE, metoprolol tartrate, oxyCODONE-acetaminophen, polyethylene glycol, psyllium   Vital Signs    Vitals:   10/31/21 1914 10/31/21 2315 11/01/21 0317 11/01/21 0322  BP: 107/75 109/82 101/80   Pulse: 98 94 100   Resp: 18 20 18    Temp: 98.1 F (36.7 C) 98.4 F (36.9 C) 98 F (36.7 C)   TempSrc: Oral Oral Oral   SpO2: 98% 96% 97%   Weight:    85.4 kg  Height:        Intake/Output Summary (Last 24 hours) at 11/01/2021 0814 Last data filed at 11/01/2021 0500 Gross per 24 hour  Intake 720 ml  Output 1900 ml  Net -1180 ml    Last 3 Weights 11/01/2021 10/31/2021 10/30/2021  Weight (lbs) 188 lb 3.2 oz 187 lb 6.3 oz 188 lb 6.4 oz  Weight (kg) 85.367 kg 85 kg 85.458 kg      Telemetry    Telemetry w/ V rates < 110 bpm. I do not see any sustained rates that are significantly elevated. - Personally Reviewed  ECG    No new EKGs - Personally Reviewed  Physical Exam   GEN: No acute distress.   Neck: No JVD Cardiac: irreg-irreg, no murmurs, rubs, or gallops.  Respiratory: CTA b/l. GI: Soft, nontender, non-distended  MS: No edema; No deformity. Neuro:  Nonfocal  Psych: Normal affect   Labs    High Sensitivity Troponin:   Recent Labs  Lab 10/05/21 0442 10/27/21 1116 10/27/21 1315 10/28/21 1030  10/28/21 1215  TROPONINIHS 18* 13 16 15  18*      Chemistry Recent Labs  Lab 10/27/21 1116 10/28/21 1030 10/29/21 0133  NA 138 141 134*  K 3.1* 2.9* 3.5  CL 99 100 100  CO2 28 26 27   GLUCOSE 116* 108* 93  BUN 15 17 12   CREATININE 1.31* 1.25* 1.15  CALCIUM 9.1 10.2 8.9  MG 1.7 1.7  --   PROT 7.2 7.4  --   ALBUMIN 3.7 4.3  --   AST 26 22  --   ALT 20 21  --   ALKPHOS 62 64  --   BILITOT 2.3* 1.9*  --   GFRNONAA 56* 60* >60  ANIONGAP 11 15 7      Lipids No results for input(s): CHOL, TRIG, HDL, LABVLDL, LDLCALC, CHOLHDL in the last 168 hours.  Hematology Recent Labs  Lab 10/27/21 1116 10/28/21 1030 10/29/21 0133  WBC 6.2 6.7 7.0  RBC 5.69 5.65 5.36  HGB 16.7 16.3 15.6  HCT 50.2 50.4 47.7  MCV 88.2 89.2 89.0  MCH 29.3 28.8 29.1  MCHC 33.3 32.3 32.7  RDW 13.7 14.1 14.0  PLT 199 196 203    Thyroid No results for input(s): TSH, FREET4 in  the last 168 hours.  BNP Recent Labs  Lab 10/27/21 1116 10/28/21 1030  BNP 133.7* 116.2*     DDimer No results for input(s): DDIMER in the last 168 hours.   Radiology    ECHOCARDIOGRAM COMPLETE  Result Date: 10/31/2021    ECHOCARDIOGRAM REPORT   Patient Name:   Patrick Roth Date of Exam: 10/31/2021 Medical Rec #:  [id number]     Height:       68.0 in Accession #:    [id number]    Weight:       187.4 lb Date of Birth:  [birth year]     BSA:          1.988 m Patient Age:    75 years      BP:           123/81 mmHg Patient Gender: M             HR:           56 bpm. Exam Location:  Inpatient Procedure: 2D Echo Indications:    atrial fibrillation  History:        Patient has prior history of Echocardiogram examinations, most                 recent 02/24/2018. Arrythmias:V-Tach, Atrial Flutter and Atrial                 Fibrillation.  Sonographer:    Johny Chess RDCS Referring Phys: [id number] [place]  1. Inferior basal hypokinesis . Left ventricular ejection fraction, by estimation, is 50 to 55%. The left  ventricle has low normal function. The left ventricle demonstrates regional wall motion abnormalities (see scoring diagram/findings for description). There is mild left ventricular hypertrophy. Left ventricular diastolic parameters were normal.  2. Right ventricular systolic function is normal. The right ventricular size is normal. There is normal pulmonary artery systolic pressure.  3. Left atrial size was mildly dilated.  4. The mitral valve is degenerative. Mild mitral valve regurgitation. No evidence of mitral stenosis. Moderate mitral annular calcification.  5. The aortic valve is tricuspid. Aortic valve regurgitation is not visualized. Aortic valve sclerosis/calcification is present, without any evidence of aortic stenosis.  6. The inferior vena cava is normal in size with greater than 50% respiratory variability, suggesting right atrial pressure of 3 mmHg. FINDINGS  Left Ventricle: Inferior basal hypokinesis. Left ventricular ejection fraction, by estimation, is 50 to 55%. The left ventricle has low normal function. The left ventricle demonstrates regional wall motion abnormalities. The left ventricular internal cavity size was normal in size. There is mild left ventricular hypertrophy. Left ventricular diastolic parameters were normal. Right Ventricle: The right ventricular size is normal. No increase in right ventricular wall thickness. Right ventricular systolic function is normal. There is normal pulmonary artery systolic pressure. The tricuspid regurgitant velocity is 1.81 m/s, and  with an assumed right atrial pressure of 3 mmHg, the estimated right ventricular systolic pressure is 22.9 mmHg. Left Atrium: Left atrial size was mildly dilated. Right Atrium: Right atrial size was normal in size. Pericardium: There is no evidence of pericardial effusion. Mitral Valve: The mitral valve is degenerative in appearance. There is mild thickening of the mitral valve leaflet(s). There is mild calcification of the  mitral valve leaflet(s). Moderate mitral annular calcification. Mild mitral valve regurgitation. No evidence of mitral valve stenosis. Tricuspid Valve: The tricuspid valve is normal in structure. Tricuspid valve regurgitation is mild . No evidence of tricuspid stenosis. Aortic  Valve: The aortic valve is tricuspid. Aortic valve regurgitation is not visualized. Aortic valve sclerosis/calcification is present, without any evidence of aortic stenosis. Pulmonic Valve: The pulmonic valve was normal in structure. Pulmonic valve regurgitation is not visualized. No evidence of pulmonic stenosis. Aorta: The aortic root is normal in size and structure. Venous: The inferior vena cava is normal in size with greater than 50% respiratory variability, suggesting right atrial pressure of 3 mmHg. IAS/Shunts: No atrial level shunt detected by color flow Doppler.  LEFT VENTRICLE PLAX 2D LVIDd:         4.50 cm   Diastology LVIDs:         3.10 cm   LV e' medial:  8.70 cm/s LV PW:         1.10 cm   LV e' lateral: 12.10 cm/s LV IVS:        1.20 cm LVOT diam:     2.00 cm LV SV:         49 LV SV Index:   25 LVOT Area:     3.14 cm  RIGHT VENTRICLE RV S prime:     8.49 cm/s TAPSE (M-mode): 1.0 cm LEFT ATRIUM             Index        RIGHT ATRIUM           Index LA diam:        4.10 cm 2.06 cm/m   RA Area:     14.00 cm LA Vol (A2C):   70.1 ml 35.26 ml/m  RA Volume:   32.30 ml  16.25 ml/m LA Vol (A4C):   64.4 ml 32.40 ml/m LA Biplane Vol: 71.5 ml 35.97 ml/m  AORTIC VALVE LVOT Vmax:   70.40 cm/s LVOT Vmean:  45.000 cm/s LVOT VTI:    0.156 m  AORTA Ao Root diam: 3.20 cm TRICUSPID VALVE TR Peak grad:   13.1 mmHg TR Vmax:        181.00 cm/s  SHUNTS Systemic VTI:  0.16 m Systemic Diam: 2.00 cm Jenkins Rouge MD Electronically signed by Jenkins Rouge MD Signature Date/Time: 10/31/2021/4:08:15 PM    Final    DG ESOPHAGUS W SINGLE CM (SOL OR THIN BA)  Result Date: 10/30/2021 CLINICAL DATA:  75 year old male with history of dysphagia. Sensation  of something stuck in the throat. EXAM: ESOPHOGRAM/BARIUM SWALLOW TECHNIQUE: Single contrast examination was performed using  thin barium. FLUOROSCOPY TIME:  Fluoroscopy Time:  1.0 Radiation Exposure Index (if provided by the fluoroscopic device): 7.2 Number of Acquired Spot Images: 0 COMPARISON:  None. FINDINGS: Within the limitations of today's single contrast examination, there was no esophageal mass, stricture or esophageal ring. Small hiatal hernia. Within the hiatal hernia there is a focal outpouching, compatible with a small diverticulum. Esophageal motility was grossly abnormal with failure to propagate normal primary peristaltic waves. Extensive tertiary contractions were also observed. IMPRESSION: 1. Nonspecific esophageal motility disorder with extensive tertiary contractions. 2. Small hiatal hernia. Small diverticulum noted within the hiatal hernia. Electronically Signed   By: Vinnie Langton M.D.   On: 10/30/2021 08:47    Cardiac Studies  No new    Patient Profile     75 y.o. male AFib, HTN, CVA,  HLD, syncope attributed to orthostasis in 2017, another stroke Feb 2022 (compliant with his [place] at the time)  >> polycythemia admitted with recurrent near syncope, abdominal complaints and AFib w/RVR  Assessment & Plan   #pAF Rate control is improving. Has baseline  conduction disease so IC agents are not options. Planning for dofetilide once chlorthalidone has washed out. - cont dilt 120mg  PO q6hours - plan to discharge later today  #Abdominal Complaints/Cramping/Constipation Outpatient GI follow up    For questions or updates, please contact [place] Please consult [email or web address] for contact info under        Signed, Vickie Epley, MD  11/01/2021, 8:14 AM

## 2021-11-02 ENCOUNTER — Telehealth: Payer: Self-pay

## 2021-11-02 ENCOUNTER — Telehealth: Payer: Self-pay | Admitting: Pharmacist

## 2021-11-02 NOTE — Telephone Encounter (Signed)
Transition Care Management Follow-up Telephone Call Date of discharge and from where: 10/30/21 [facility] How have you been since you were released from the hospital? Feeling a little better Any questions or concerns? No  Items Reviewed: Did the pt receive and understand the discharge instructions provided? Yes  Medications obtained and verified? Yes  Other? No  Any new allergies since your discharge? No  Dietary orders reviewed? Yes Do you have support at home? Yes   Home Care and Equipment/Supplies: Were home health services ordered? not applicable If so, what is the name of the agency?   Has the agency set up a time to come to the patient's home? not applicable Were any new equipment or medical supplies ordered?  No What is the name of the medical supply agency?  Were you able to get the supplies/equipment? not applicable Do you have any questions related to the use of the equipment or supplies? No  Functional Questionnaire: (I = Independent and D = Dependent) ADLs: I  Bathing/Dressing- I  Meal Prep- I  Eating- I  Maintaining continence- I  Transferring/Ambulation- I  Managing Meds- I  Follow up appointments reviewed:  PCP Hospital f/u appt confirmed? No  Pt stated he will follow p as needed  [facility] f/u appt confirmed? Yes  Scheduled to see Dr Roderic Palau  on 11/10/21 @ 11 a.m. Are transportation arrangements needed? No  If their condition worsens, is the pt aware to call PCP or go to the Emergency Dept.? Yes Was the patient provided with contact information for the PCP's office or ED? Yes Was to pt encouraged to call back with questions or concerns? Yes

## 2021-11-02 NOTE — Telephone Encounter (Signed)
Medication list reviewed in anticipation of upcoming Tikosyn initiation. Patient is not taking any contraindicated or QTc prolonging medications. He was previously on chlorthalidone with last dose 12/7 before recent hospital admission. Requires full week washout due to long half-life. Currently scheduled for Tikosyn start 12/20 which is acceptable. Discharged on K supplementation due to hypokalemia.  Patient is anticoagulated on Eliquis 5mg  BID on the appropriate dose. Please ensure that patient has not missed any anticoagulation doses in the 3 weeks prior to Tikosyn initiation.   Patient will need to be counseled to avoid use of Benadryl while on Tikosyn and in the 2-3 days prior to Tikosyn initiation.

## 2021-11-03 ENCOUNTER — Ambulatory Visit: Payer: Medicare HMO | Admitting: Physician Assistant

## 2021-11-03 ENCOUNTER — Other Ambulatory Visit: Payer: Self-pay

## 2021-11-03 ENCOUNTER — Encounter: Payer: Self-pay | Admitting: Physician Assistant

## 2021-11-03 VITALS — BP 92/58 | HR 62 | Ht 68.0 in | Wt 196.0 lb

## 2021-11-03 DIAGNOSIS — Z79899 Other long term (current) drug therapy: Secondary | ICD-10-CM

## 2021-11-03 DIAGNOSIS — I484 Atypical atrial flutter: Secondary | ICD-10-CM

## 2021-11-03 DIAGNOSIS — I10 Essential (primary) hypertension: Secondary | ICD-10-CM | POA: Diagnosis not present

## 2021-11-03 DIAGNOSIS — I480 Paroxysmal atrial fibrillation: Secondary | ICD-10-CM | POA: Diagnosis not present

## 2021-11-03 LAB — BASIC METABOLIC PANEL
BUN/Creatinine Ratio: 11 (ref 10–24)
BUN: 14 mg/dL (ref 8–27)
CO2: 26 mmol/L (ref 20–29)
Calcium: 9.3 mg/dL (ref 8.6–10.2)
Chloride: 100 mmol/L (ref 96–106)
Creatinine, Ser: 1.28 mg/dL — ABNORMAL HIGH (ref 0.76–1.27)
Glucose: 94 mg/dL (ref 70–99)
Potassium: 4.3 mmol/L (ref 3.5–5.2)
Sodium: 139 mmol/L (ref 134–144)
eGFR: 58 mL/min/{1.73_m2} — ABNORMAL LOW (ref >59)

## 2021-11-03 LAB — MAGNESIUM: Magnesium: 1.9 mg/dL (ref 1.6–2.3)

## 2021-11-03 NOTE — Patient Instructions (Signed)
Medication Instructions:   Your physician recommends that you continue on your current medications as directed. Please refer to the Current Medication list given to you today.   *If you need a refill on your cardiac medications before your next appointment, please call your pharmacy*   Lab Work:  BMET AND [place]   If you have labs (blood work) drawn today and your tests are completely normal, you will receive your results only by: [place] (if you have MyChart) OR A paper copy in the mail If you have any lab test that is abnormal or we need to change your treatment, we will call you to review the results.   Testing/Procedures: NONE ORDERED  TODAY     Follow-Up: At [facility], you and your health needs are our priority.  As part of our continuing mission to provide you with exceptional heart care, we have created designated Provider Care Teams.  These Care Teams include your primary Cardiologist (physician) and Advanced Practice Providers (APPs -  Physician Assistants and Nurse Practitioners) who all work together to provide you with the care you need, when you need it.  We recommend signing up for the patient portal called "MyChart".  Sign up information is provided on this After Visit Summary.  MyChart is used to connect with patients for Virtual Visits (Telemedicine).  Patients are able to view lab/test results, encounter notes, upcoming appointments, etc.  Non-urgent messages can be sent to your provider as well.   To learn more about what you can do with MyChart, go to [email or web address].    Your next appointment:    7 day(s)  The format for your next appointment:   In Person  Provider:   You will follow up in the [facility] located at [facility]. Your provider will be: Roderic Palau, NP    Other Instructions

## 2021-11-05 ENCOUNTER — Telehealth: Payer: Self-pay

## 2021-11-05 DIAGNOSIS — G8929 Other chronic pain: Secondary | ICD-10-CM | POA: Diagnosis not present

## 2021-11-05 DIAGNOSIS — I482 Chronic atrial fibrillation, unspecified: Secondary | ICD-10-CM | POA: Diagnosis not present

## 2021-11-05 DIAGNOSIS — R1084 Generalized abdominal pain: Secondary | ICD-10-CM | POA: Diagnosis not present

## 2021-11-05 DIAGNOSIS — Z79899 Other long term (current) drug therapy: Secondary | ICD-10-CM | POA: Diagnosis not present

## 2021-11-05 NOTE — Telephone Encounter (Signed)
Patient calling today to request a specialist that can help with vagus nerve issues-he states he does not know a specialist in the area and would like for Alyssa to find him one locally

## 2021-11-06 ENCOUNTER — Other Ambulatory Visit: Payer: Self-pay | Admitting: Physician Assistant

## 2021-11-06 DIAGNOSIS — K594 Anal spasm: Secondary | ICD-10-CM

## 2021-11-06 DIAGNOSIS — K5903 Drug induced constipation: Secondary | ICD-10-CM

## 2021-11-07 ENCOUNTER — Telehealth: Payer: Self-pay | Admitting: Physician Assistant

## 2021-11-07 NOTE — Telephone Encounter (Signed)
Paged by answering service.  Patient is not feeling well and since last night.  Reports shaky feeling, abdominal discomfort and frequent urination.  He denies fever, chills, cough, congestion, chest pain, palpitation or melena.  Eating and drinking okay.  Most recent blood pressure 149/71.  Heart rate 105.  Reviewed prior history.  Pending Tikosyn admission next week.  Patient wanted to take his as needed metoprolol.  I have advised he can take half a tablet at 1.5 mg and see the response.  Patient was appreciative of call.  His symptoms most likely not related to cardiac.

## 2021-11-09 NOTE — Telephone Encounter (Signed)
Left detailed message that referral placed

## 2021-11-10 ENCOUNTER — Other Ambulatory Visit: Payer: Self-pay

## 2021-11-10 ENCOUNTER — Inpatient Hospital Stay (HOSPITAL_COMMUNITY)
Admission: AD | Admit: 2021-11-10 | Discharge: 2021-11-13 | DRG: 310 | Disposition: A | Discharge status: Home / Self Care | Payer: Medicare HMO | Source: Ambulatory Visit | Attending: Cardiology | Admitting: Cardiology

## 2021-11-10 ENCOUNTER — Ambulatory Visit (HOSPITAL_COMMUNITY)
Admit: 2021-11-10 | Discharge: 2021-11-10 | Disposition: A | Discharge status: Home / Self Care | Payer: Medicare HMO | Attending: Nurse Practitioner | Admitting: Nurse Practitioner

## 2021-11-10 ENCOUNTER — Encounter (HOSPITAL_COMMUNITY): Payer: Self-pay | Admitting: Nurse Practitioner

## 2021-11-10 ENCOUNTER — Encounter (HOSPITAL_COMMUNITY): Payer: Self-pay | Admitting: Internal Medicine

## 2021-11-10 VITALS — BP 146/84 | HR 102 | Ht 68.0 in | Wt 194.0 lb

## 2021-11-10 DIAGNOSIS — I10 Essential (primary) hypertension: Secondary | ICD-10-CM | POA: Diagnosis present

## 2021-11-10 DIAGNOSIS — Z885 Allergy status to narcotic agent status: Secondary | ICD-10-CM | POA: Diagnosis not present

## 2021-11-10 DIAGNOSIS — K5900 Constipation, unspecified: Secondary | ICD-10-CM | POA: Diagnosis present

## 2021-11-10 DIAGNOSIS — Z8249 Family history of ischemic heart disease and other diseases of the circulatory system: Secondary | ICD-10-CM

## 2021-11-10 DIAGNOSIS — I471 Supraventricular tachycardia: Secondary | ICD-10-CM | POA: Diagnosis not present

## 2021-11-10 DIAGNOSIS — Z888 Allergy status to other drugs, medicaments and biological substances status: Secondary | ICD-10-CM | POA: Diagnosis not present

## 2021-11-10 DIAGNOSIS — I459 Conduction disorder, unspecified: Secondary | ICD-10-CM | POA: Diagnosis not present

## 2021-11-10 DIAGNOSIS — Z79899 Other long term (current) drug therapy: Secondary | ICD-10-CM

## 2021-11-10 DIAGNOSIS — I4819 Other persistent atrial fibrillation: Principal | ICD-10-CM

## 2021-11-10 DIAGNOSIS — Z886 Allergy status to analgesic agent status: Secondary | ICD-10-CM | POA: Diagnosis not present

## 2021-11-10 DIAGNOSIS — Z8719 Personal history of other diseases of the digestive system: Secondary | ICD-10-CM | POA: Diagnosis not present

## 2021-11-10 DIAGNOSIS — I4891 Unspecified atrial fibrillation: Secondary | ICD-10-CM | POA: Diagnosis present

## 2021-11-10 DIAGNOSIS — Z8616 Personal history of COVID-19: Secondary | ICD-10-CM | POA: Diagnosis not present

## 2021-11-10 DIAGNOSIS — I4892 Unspecified atrial flutter: Secondary | ICD-10-CM | POA: Diagnosis present

## 2021-11-10 DIAGNOSIS — I480 Paroxysmal atrial fibrillation: Secondary | ICD-10-CM | POA: Diagnosis present

## 2021-11-10 DIAGNOSIS — Z801 Family history of malignant neoplasm of trachea, bronchus and lung: Secondary | ICD-10-CM

## 2021-11-10 DIAGNOSIS — Z91048 Other nonmedicinal substance allergy status: Secondary | ICD-10-CM | POA: Diagnosis not present

## 2021-11-10 DIAGNOSIS — Z7901 Long term (current) use of anticoagulants: Secondary | ICD-10-CM | POA: Diagnosis not present

## 2021-11-10 DIAGNOSIS — E785 Hyperlipidemia, unspecified: Secondary | ICD-10-CM | POA: Diagnosis not present

## 2021-11-10 DIAGNOSIS — Z23 Encounter for immunization: Secondary | ICD-10-CM

## 2021-11-10 DIAGNOSIS — R55 Syncope and collapse: Secondary | ICD-10-CM | POA: Diagnosis present

## 2021-11-10 DIAGNOSIS — Z8673 Personal history of transient ischemic attack (TIA), and cerebral infarction without residual deficits: Secondary | ICD-10-CM

## 2021-11-10 DIAGNOSIS — D6869 Other thrombophilia: Secondary | ICD-10-CM

## 2021-11-10 DIAGNOSIS — D751 Secondary polycythemia: Secondary | ICD-10-CM | POA: Diagnosis not present

## 2021-11-10 LAB — BASIC METABOLIC PANEL
Anion gap: 7 (ref 5–15)
BUN: 8 mg/dL (ref 8–23)
CO2: 24 mmol/L (ref 22–32)
Calcium: 9.1 mg/dL (ref 8.9–10.3)
Chloride: 107 mmol/L (ref 98–111)
Creatinine, Ser: 1.32 mg/dL — ABNORMAL HIGH (ref 0.61–1.24)
GFR, Estimated: 56 mL/min — ABNORMAL LOW (ref >60)
Glucose, Bld: 99 mg/dL (ref 70–99)
Potassium: 3.4 mmol/L — ABNORMAL LOW (ref 3.5–5.1)
Sodium: 138 mmol/L (ref 135–145)

## 2021-11-10 LAB — POTASSIUM: Potassium: 4.2 mmol/L (ref 3.5–5.1)

## 2021-11-10 LAB — MAGNESIUM: Magnesium: 1.9 mg/dL (ref 1.7–2.4)

## 2021-11-10 MED ORDER — FAMOTIDINE 20 MG PO TABS
20.0000 mg | ORAL_TABLET | Freq: Every day | ORAL | Status: DC
  Administered 2021-11-11 – 2021-11-13 (×3): 20 mg via ORAL
  Filled 2021-11-10 (×3): qty 1

## 2021-11-10 MED ORDER — SODIUM CHLORIDE 0.9% FLUSH: 3.0000 mL | INTRAVENOUS | Status: DC | PRN

## 2021-11-10 MED ORDER — ACETAMINOPHEN ER 650 MG PO TBCR: 1300.0000 mg | EXTENDED_RELEASE_TABLET | Freq: Three times a day (TID) | ORAL | Status: DC | PRN

## 2021-11-10 MED ORDER — SODIUM CHLORIDE 0.9% FLUSH
3.0000 mL | Freq: Two times a day (BID) | INTRAVENOUS | Status: DC
  Administered 2021-11-10 – 2021-11-13 (×4): 3 mL via INTRAVENOUS

## 2021-11-10 MED ORDER — DOFETILIDE 250 MCG PO CAPS
250.0000 ug | ORAL_CAPSULE | Freq: Two times a day (BID) | ORAL | Status: DC
  Administered 2021-11-10 – 2021-11-13 (×6): 250 ug via ORAL
  Filled 2021-11-10 (×6): qty 1

## 2021-11-10 MED ORDER — POTASSIUM CHLORIDE CRYS ER 20 MEQ PO TBCR
20.0000 meq | EXTENDED_RELEASE_TABLET | Freq: Two times a day (BID) | ORAL | Status: DC
Start: 2021-11-11 — End: 2021-11-11
  Filled 2021-11-10: qty 1

## 2021-11-10 MED ORDER — APIXABAN 5 MG PO TABS
5.0000 mg | ORAL_TABLET | Freq: Two times a day (BID) | ORAL | Status: DC
  Administered 2021-11-10 – 2021-11-13 (×6): 5 mg via ORAL
  Filled 2021-11-10 (×6): qty 1

## 2021-11-10 MED ORDER — METOPROLOL TARTRATE 25 MG PO TABS: 25.0000 mg | ORAL_TABLET | Freq: Two times a day (BID) | ORAL | Status: DC | PRN

## 2021-11-10 MED ORDER — POTASSIUM CHLORIDE CRYS ER 20 MEQ PO TBCR
40.0000 meq | EXTENDED_RELEASE_TABLET | ORAL | Status: AC
  Administered 2021-11-10 (×2): 40 meq via ORAL
  Filled 2021-11-10 (×2): qty 2

## 2021-11-10 MED ORDER — SODIUM CHLORIDE 0.9 % IV SOLN: 250.0000 mL | INTRAVENOUS | Status: DC | PRN

## 2021-11-10 MED ORDER — DILTIAZEM HCL 60 MG PO TABS
120.0000 mg | ORAL_TABLET | Freq: Four times a day (QID) | ORAL | Status: DC
  Administered 2021-11-10 – 2021-11-11 (×4): 120 mg via ORAL
  Filled 2021-11-10 (×5): qty 2

## 2021-11-10 MED ORDER — MAGNESIUM OXIDE -MG SUPPLEMENT 400 (240 MG) MG PO TABS
400.0000 mg | ORAL_TABLET | Freq: Two times a day (BID) | ORAL | Status: DC
  Administered 2021-11-10 – 2021-11-13 (×6): 400 mg via ORAL
  Filled 2021-11-10 (×9): qty 1

## 2021-11-10 MED ORDER — MAGNESIUM SULFATE 2 GM/50ML IV SOLN
2.0000 g | Freq: Once | INTRAVENOUS | Status: AC
  Administered 2021-11-10: 18:00:00 2 g via INTRAVENOUS
  Filled 2021-11-10: qty 50

## 2021-11-10 MED ORDER — ACETAMINOPHEN 325 MG PO TABS
650.0000 mg | ORAL_TABLET | Freq: Four times a day (QID) | ORAL | Status: DC | PRN
  Administered 2021-11-11: 09:00:00 650 mg via ORAL
  Filled 2021-11-10: qty 2

## 2021-11-10 MED ORDER — PNEUMOCOCCAL VAC POLYVALENT 25 MCG/0.5ML IJ INJ
0.5000 mL | INJECTION | INTRAMUSCULAR | Status: AC | PRN
  Administered 2021-11-13: 13:00:00 0.5 mL via INTRAMUSCULAR
  Filled 2021-11-10: qty 0.5

## 2021-11-10 NOTE — Progress Notes (Signed)
Pharmacy: Dofetilide (Tikosyn) - Initial Consult Assessment and Electrolyte Replacement  Pharmacy consulted to assist in monitoring and replacing electrolytes in this 75 y.o. male admitted on 11/10/2021 undergoing dofetilide initiation. First dofetilide dose: 250 mcg to start tonight.  Assessment:  Patient Exclusion Criteria: If any screening criteria checked as "Yes", then  patient  should NOT receive dofetilide until criteria item is corrected.  If Yes please indicate correction plan.  YES  NO Patient  Exclusion Criteria Correction Plan   [x]   []   Baseline QTc interval is greater than or equal to 440 msec. IF above YES box checked dofetilide contraindicated unless patient has ICD; then may proceed if QTc 500-550 msec or with known ventricular conduction abnormalities may proceed with QTc 550-600 msec. QTc = 512 - RBBN contributing per EP - MD aware  MD aware, RBBB contributing   []   [x]   Patient is known or suspected to have a digoxin level greater than 2 ng/ml: No results found for: DIGOXIN     []   [x]   Creatinine clearance less than 20 ml/min (calculated using Cockcroft-Gault, actual body weight and serum creatinine): Estimated Creatinine Clearance: 51.3 mL/min (A) (by C-G formula based on SCr of 1.32 mg/dL (H)).     []   [x]  Patient has received drugs known to prolong the QT intervals within the last 48 hours (phenothiazines, tricyclics or tetracyclic antidepressants, erythromycin, H-1 antihistamines, cisapride, fluoroquinolones, azithromycin). Drugs not listed above may have an, as yet, undetected potential to prolong the QT interval, updated information on QT prolonging agents is available at this website:QT prolonging agents or www.crediblemeds.org    []   [x]   Patient received a dose of hydrochlorothiazide (Oretic) alone or in any combination including triamterene (Dyazide, Maxzide) in the last 48 hours.    []   [x]  Patient received a medication known to increase  dofetilide plasma concentrations prior to initial dofetilide dose:  Trimethoprim (Primsol, Proloprim) in the last 36 hours Verapamil (Calan, Verelan) in the last 36 hours or a sustained release dose in the last 72 hours Megestrol (Megace) in the last 5 days  Cimetidine (Tagamet) in the last 6 hours Ketoconazole (Nizoral) in the last 24 hours Itraconazole (Sporanox) in the last 48 hours  Prochlorperazine (Compazine) in the last 36 hours     []   [x]   Patient is known to have a history of torsades de pointes; congenital or acquired long QT syndromes.    []   [x]   Patient has received a Class 1 antiarrhythmic with less than 2 half-lives since last dose. (Disopyramide, Quinidine, Procainamide, Lidocaine, Mexiletine, Flecainide, Propafenone)    []   [x]   Patient has received amiodarone therapy in the past 3 months or amiodarone level is greater than 0.3 ng/ml.    Patient has been appropriately anticoagulated with Eliquis.  Labs:    Component Value Date/Time   K 3.4 (L) 11/10/2021 1027   MG 1.9 11/10/2021 1027     Plan: Potassium: K = 3.4 - getting K supp now, will recheck BMET at 7 pm.  Magnesium: Mg 1.8-2: Give Mg 2 gm IV x1 to prevent Mg from dropping below 1.8 - do not need to recheck Mg. Appropriate to initiate Tikosyn   Thank you for allowing pharmacy to participate in this patient's care   [place] Crane, Roylene Reason, [facility] Clinical Pharmacist  11/10/2021 2:46 PM   [facility] pharmacy phone numbers are listed on [email or web address]

## 2021-11-10 NOTE — Progress Notes (Signed)
Primary Care Physician: Allwardt, [name], PA-C Referring Physician: Dr. Christiana Pellant is a 75 y.o. male with a h/o   AFib, HTN, CVA,  HLD, syncope attributed to orthostasis in 2017, h/o medication non complinace, another stroke Feb 2022 (compliant with his [place] at the time)  >> polycythemia.   He is here for tikosyn admit as he has had many recent ER visits for afib with RVR.  Most recent ER visit was 12/6  with near syncope associated with straining to have a BM, his HR went up and he took a couple doses of PRN lopressor went to the ER he was in AFib 120s, his HR settled to 90's-110's, he was feeling well and sent home He returned 12/7 and was admitted this time though it seems for diarrhea, not near syncope. He was in AFib w/RVR and rates getting to 200's, he got lopressor in the ER with improvement in his HRs, admitted for further management. At this time, it was decided that he would be admitted at a later date for Tikosyn as he had to allow 7 day wash out of chlorthalidone.   He is now in the afib clinic for admit for Tikosyn load. He has not missed any anticoagulation, no benadryl use, has been off chlorthalidone since hospitalization. He has  conduction disease with  a wide qrs complex, qt prolonged at 516 ms, RBBB contributing.    Today, he denies symptoms of palpitations, chest pain, shortness of breath, orthopnea, PND, lower extremity edema, dizziness, presyncope, syncope, or neurologic sequela. The patient is tolerating medications without difficulties and is otherwise without complaint today.   Past Medical History:  Diagnosis Date   Atrial fibrillation/flutter    Intol of amio, beta blocker // Echo 12/22: inf HK, EF 50-55, normal RVSF, mild LaE, mild MR, mod MAC, AV sclerosis w/o aS   Colon polyps    Constipation 05/27/2015   DOE (dyspnea on exertion) 10/13/2015   Faintness 06/20/2016   History of chicken pox    Hyperlipidemia    Hypertension    Internal  hemorrhoids    Polycythemia    Sciatica    Stroke ([place])    Hx of stroke in2008; recurrent CVA in 12/2020   Syncope and collapse 06/28/2016   Past Surgical History:  Procedure Laterality Date   CARDIOVERSION N/A 09/02/2017   Procedure: CARDIOVERSION;  Surgeon: [name], MD;  Location: [facility];  Service: Cardiovascular;  Laterality: N/A;   COLONOSCOPY  2011   great toe surgery     PROSTATE BIOPSY      Current Outpatient Medications  Medication Sig Dispense Refill   acetaminophen (TYLENOL) 650 MG CR tablet Take 1,300 mg by mouth every 8 (eight) hours as needed for pain.     apixaban (ELIQUIS) 5 MG TABS tablet Take 1 tablet (5 mg total) by mouth 2 (two) times daily. 180 tablet 1   dicyclomine (BENTYL) 20 MG tablet Take 1 tablet (20 mg total) by mouth every 6 (six) hours as needed for spasms. 30 tablet 0   diltiazem (CARDIZEM) 120 MG tablet Take 1 tablet (120 mg total) by mouth every 6 (six) hours. 120 tablet 11   Evolocumab (REPATHA SURECLICK) 102 MG/ML SOAJ Inject 1 pen into the skin every 14 (fourteen) days. 6 mL 3   famotidine (PEPCID) 20 MG tablet Take 1 tablet (20 mg total) by mouth daily. 30 tablet 0   Magnesium Oxide 400 MG CAPS Take 1 capsule (400 mg  total) by mouth 2 (two) times daily. 60 capsule 0   metoprolol tartrate (LOPRESSOR) 25 MG tablet Take 1 tablet (25 mg total) by mouth 2 (two) times daily as needed (FOR HEARTRATE HIGHER THAN 120 bpm). 90 tablet 0   oxyCODONE-acetaminophen (PERCOCET/ROXICET) 5-325 MG tablet Take 1 tablet by mouth every 4 (four) hours as needed for severe pain. 12 tablet 0   polyethylene glycol powder (GLYCOLAX/MIRALAX) 17 GM/SCOOP powder Take 17 g by mouth in the morning and at bedtime. Start with 1 scoop 2 times per day until bowels are moving, then reduce to 1 scoop daily. Drink 64oz of water daily. 3350 g 1   potassium chloride SA (KLOR-CON M) 20 MEQ tablet Take 1 tablet (20 mEq total) by mouth 2 (two) times daily after a meal.     psyllium  (METAMUCIL) 58.6 % powder Take 1 packet by mouth daily as needed (constipation).     No current facility-administered medications for this encounter.    Allergies  Allergen Reactions   Crestor [Rosuvastatin Calcium] Shortness Of Breath    Muscle cramps   Spironolactone Shortness Of Breath   Terazosin Shortness Of Breath, Palpitations and Other (See Comments)    Nerve pain   Amiodarone Other (See Comments)    Dropped pulse real low to 45-50, and made him feel very cold   Atenolol Other (See Comments)    Drowsiness and "flu sxs"   Bystolic [Nebivolol Hcl] Other (See Comments)    GI issues   Hydralazine Other (See Comments)    Joint swelling   Tizanidine Other (See Comments)    Syncope, Elevated BP/pulse   Amlodipine Other (See Comments)   Clonidine Other (See Comments)   Hydrocodone-Acetaminophen Other (See Comments)   Levofloxacin Other (See Comments)   Losartan Other (See Comments)    Did not work.  Pt states he couldn't take b/c of SE, but can't remember what SE were.   Maxzide [Hydrochlorothiazide W-Triamterene] Other (See Comments)    Does not tolerate potassium-sparing diuretics   Olmesartan Other (See Comments)   Tape Other (See Comments)    Other reaction(s): Unknown   Tussionex Pennkinetic Er [Hydrocod Polst-Cpm Polst Er] Other (See Comments)    Other reaction(s): Unknown    Social History   Socioeconomic History   Marital status: Married    Spouse name: [name]   Number of children: 1   Years of education: 12   Highest education level: Not on file  Occupational History   Occupation: Retired  Tobacco Use   Smoking status: Never   Smokeless tobacco: Never  Vaping Use   Vaping Use: Never used  Substance and Sexual Activity   Alcohol use: No   Drug use: No   Sexual activity: Not on file  Other Topics Concern   Not on file  Social History Narrative   Lives with wife   Right Handed   Drinks no caffeine   Social Determinants of Health   Financial  Resource Strain: Low Risk    Difficulty of Paying Living Expenses: Not hard at all  Food Insecurity: No Food Insecurity   Worried About Charity fundraiser in the Last Year: Never true   [place] in the Last Year: Never true  Transportation Needs: No Transportation Needs   Lack of Transportation (Medical): No   Lack of Transportation (Non-Medical): No  Physical Activity: Inactive   Days of Exercise per Week: 0 days   Minutes of Exercise per Session: 0 min  Stress: No Stress Concern Present   Feeling of Stress : Not at all  Social Connections: Moderately Isolated   Frequency of Communication with Friends and Family: More than three times a week   Frequency of Social Gatherings with Friends and Family: More than three times a week   Attends Religious Services: Never   Marine scientist or Organizations: No   Attends Archivist Meetings: Never   Marital Status: Married  Human resources officer Violence: Not At Risk   Fear of Current or Ex-Partner: No   Emotionally Abused: No   Physically Abused: No   Sexually Abused: No    Family History  Problem Relation Age of Onset   Hypertension Father 77       Deceased   Heart disease Father    Lung cancer Mother 29       Deceased   Healthy Sister        x3   Healthy Brother        x4   Heart disease Brother        #5   Other Daughter        Alpha Thalassemia   Lupus Daughter        #2-deceased   Diabetes Neg Hx    Heart attack Neg Hx    Hyperlipidemia Neg Hx    Sudden death Neg Hx     ROS- All systems are reviewed and negative except as per the HPI above  Physical Exam: Vitals:   11/10/21 1016  BP: (!) 146/84  Pulse: (!) 102  SpO2: 99%  Weight: 88 kg  Height: _0  (1.727 m)   Wt Readings from Last 3 Encounters:  11/10/21 88 kg  11/03/21 88.9 kg  11/01/21 85.4 kg    Labs: Lab Results  Component Value Date   NA 139 11/03/2021   K 4.3 11/03/2021   CL 100 11/03/2021   CO2 26 11/03/2021   GLUCOSE  94 11/03/2021   BUN 14 11/03/2021   CREATININE 1.28 (H) 11/03/2021   CALCIUM 9.3 11/03/2021   MG 1.9 11/03/2021   Lab Results  Component Value Date   INR 1.1 02/03/2021   Lab Results  Component Value Date   CHOL 154 04/13/2021   HDL 55 04/13/2021   LDLCALC 83 04/13/2021   TRIG 87 04/13/2021     GEN- The patient is well appearing, alert and oriented x 3 today.   Head- normocephalic, atraumatic Eyes-  Sclera clear, conjunctiva pink Ears- hearing intact Oropharynx- clear Neck- supple, no JVP Lymph- no cervical lymphadenopathy Lungs- Clear to ausculation bilaterally, normal work of breathing Heart- Regular rate and rhythm, no murmurs, rubs or gallops, PMI not laterally displaced GI- soft, NT, ND, + BS Extremities- no clubbing, cyanosis, or edema MS- no significant deformity or atrophy Skin- no rash or lesion Psych- euthymic mood, full affect Neuro- strength and sensation are intact  EKG-wide complex tachycardia at 102 bpm, qrs int 148 ms, qtc 512 ms (RBBB) contributing     Assessment and Plan:  1. Persistent asymptomatic afib with RVR Here for Tikosyn admit  No benadryl No chorthalidone since drug stopped many  days ago  Had covid early November so no testing required  Bmet/mag pending   2. CHA2DS2VASc  score of at least 5  States no missed anticoagulation x at least 3 weeks  Continue eliquis 5 mg bid   To 6E when bed is available  Butch Penny C. Yosgart Pavey, [facility]  [address] [place], [place] [address] [phone]

## 2021-11-10 NOTE — H&P (Signed)
EP Attending  Primary Care Physician: Patrick Roth, Patrick Evens, PA-C Referring Physician: [name] is a 75 y.o. male with a h/o   AFib, HTN, CVA,  HLD, syncope attributed to orthostasis in 2017, h/o medication non complinace, another stroke Feb 2022 (compliant with his [place] at the time)  >> polycythemia.    He is here for tikosyn admit as he has had many recent ER visits for afib with RVR.   Most recent ER visit was 12/6  with near syncope associated with straining to have a BM, his HR went up and he took a couple doses of PRN lopressor went to the ER he was in AFib 120s, his HR settled to 90's-110's, he was feeling well and sent home He returned 12/7 and was admitted this time though it seems for diarrhea, not near syncope. He was in AFib w/RVR and rates getting to 200's, he got lopressor in the ER with improvement in his HRs, admitted for further management. At this time, it was decided that he would be admitted at a later date for Tikosyn as he had to allow 7 day wash out of chlorthalidone.    He is now in the afib clinic for admit for Tikosyn load. He has not missed any anticoagulation, no benadryl use, has been off chlorthalidone since hospitalization. He has  conduction disease with  a wide qrs complex, qt prolonged at 516 ms, RBBB contributing.     Today, he denies symptoms of palpitations, chest pain, shortness of breath, orthopnea, PND, lower extremity edema, dizziness, presyncope, syncope, or neurologic sequela. The patient is tolerating medications without difficulties and is otherwise without complaint today.        Past Medical History:  Diagnosis Date   Atrial fibrillation/flutter      Intol of amio, beta blocker // Echo 12/22: inf HK, EF 50-55, normal RVSF, mild LaE, mild MR, mod MAC, AV sclerosis w/o aS   Colon polyps     Constipation 05/27/2015   DOE (dyspnea on exertion) 10/13/2015   Faintness 06/20/2016   History of chicken pox     Hyperlipidemia      Hypertension     Internal hemorrhoids     Polycythemia     Sciatica     Stroke ([place])      Hx of stroke in2008; recurrent CVA in 12/2020   Syncope and collapse 06/28/2016         Past Surgical History:  Procedure Laterality Date   CARDIOVERSION N/A 09/02/2017    Procedure: CARDIOVERSION;  Surgeon: [name], MD;  Location: [facility];  Service: Cardiovascular;  Laterality: N/A;   COLONOSCOPY   2011   great toe surgery       PROSTATE BIOPSY                Current Outpatient Medications  Medication Sig Dispense Refill   acetaminophen (TYLENOL) 650 MG CR tablet Take 1,300 mg by mouth every 8 (eight) hours as needed for pain.       apixaban (ELIQUIS) 5 MG TABS tablet Take 1 tablet (5 mg total) by mouth 2 (two) times daily. 180 tablet 1   dicyclomine (BENTYL) 20 MG tablet Take 1 tablet (20 mg total) by mouth every 6 (six) hours as needed for spasms. 30 tablet 0   diltiazem (CARDIZEM) 120 MG tablet Take 1 tablet (120 mg total) by mouth every 6 (six) hours. 120 tablet 11   Evolocumab (REPATHA SURECLICK) 532  MG/ML SOAJ Inject 1 pen into the skin every 14 (fourteen) days. 6 mL 3   famotidine (PEPCID) 20 MG tablet Take 1 tablet (20 mg total) by mouth daily. 30 tablet 0   Magnesium Oxide 400 MG CAPS Take 1 capsule (400 mg total) by mouth 2 (two) times daily. 60 capsule 0   metoprolol tartrate (LOPRESSOR) 25 MG tablet Take 1 tablet (25 mg total) by mouth 2 (two) times daily as needed (FOR HEARTRATE HIGHER THAN 120 bpm). 90 tablet 0   oxyCODONE-acetaminophen (PERCOCET/ROXICET) 5-325 MG tablet Take 1 tablet by mouth every 4 (four) hours as needed for severe pain. 12 tablet 0   polyethylene glycol powder (GLYCOLAX/MIRALAX) 17 GM/SCOOP powder Take 17 g by mouth in the morning and at bedtime. Start with 1 scoop 2 times per day until bowels are moving, then reduce to 1 scoop daily. Drink 64oz of water daily. 3350 g 1   potassium chloride SA (KLOR-CON M) 20 MEQ tablet Take 1 tablet (20 mEq total)  by mouth 2 (two) times daily after a meal.       psyllium (METAMUCIL) 58.6 % powder Take 1 packet by mouth daily as needed (constipation).        No current facility-administered medications for this encounter.           Allergies  Allergen Reactions   Crestor [Rosuvastatin Calcium] Shortness Of Breath      Muscle cramps   Spironolactone Shortness Of Breath   Terazosin Shortness Of Breath, Palpitations and Other (See Comments)      Nerve pain   Amiodarone Other (See Comments)      Dropped pulse real low to 45-50, and made him feel very cold   Atenolol Other (See Comments)      Drowsiness and "flu sxs"   Bystolic [Nebivolol Hcl] Other (See Comments)      GI issues   Hydralazine Other (See Comments)      Joint swelling   Tizanidine Other (See Comments)      Syncope, Elevated BP/pulse   Amlodipine Other (See Comments)   Clonidine Other (See Comments)   Hydrocodone-Acetaminophen Other (See Comments)   Levofloxacin Other (See Comments)   Losartan Other (See Comments)      Did not work.  Pt states he couldn't take b/c of SE, but can't remember what SE were.   Maxzide [Hydrochlorothiazide W-Triamterene] Other (See Comments)      Does not tolerate potassium-sparing diuretics   Olmesartan Other (See Comments)   Tape Other (See Comments)      Other reaction(s): Unknown   Tussionex Pennkinetic Er [Hydrocod Polst-Cpm Polst Er] Other (See Comments)      Other reaction(s): Unknown      Social History         Socioeconomic History   Marital status: Married      Spouse name: Patrick Roth   Number of children: 1   Years of education: 12   Highest education level: Not on file  Occupational History   Occupation: Retired  Tobacco Use   Smoking status: Never   Smokeless tobacco: Never  Vaping Use   Vaping Use: Never used  Substance and Sexual Activity   Alcohol use: No   Drug use: No   Sexual activity: Not on file  Other Topics Concern   Not on file  Social History Narrative     Lives with wife    Right Handed    Drinks no caffeine    Social Determinants of Health  Financial Resource Strain: Low Risk    Difficulty of Paying Living Expenses: Not hard at all  Food Insecurity: No Food Insecurity   Worried About Charity fundraiser in the Last Year: Never true   Ran Out of Food in the Last Year: Never true  Transportation Needs: No Transportation Needs   Lack of Transportation (Medical): No   Lack of Transportation (Non-Medical): No  Physical Activity: Inactive   Days of Exercise per Week: 0 days   Minutes of Exercise per Session: 0 min  Stress: No Stress Concern Present   Feeling of Stress : Not at all  Social Connections: Moderately Isolated   Frequency of Communication with Friends and Family: More than three times a week   Frequency of Social Gatherings with Friends and Family: More than three times a week   Attends Religious Services: Never   Marine scientist or Organizations: No   Attends Archivist Meetings: Never   Marital Status: Married  Human resources officer Violence: Not At Risk   Fear of Current or Ex-Partner: No   Emotionally Abused: No   Physically Abused: No   Sexually Abused: No           Family History  Problem Relation Age of Onset   Hypertension Father 6        Deceased   Heart disease Father     Lung cancer Mother 8        Deceased   Healthy Sister          x3   Healthy Brother          x4   Heart disease Brother          #5   Other Daughter          Alpha Thalassemia   Lupus Daughter          #2-deceased   Diabetes Neg Hx     Heart attack Neg Hx     Hyperlipidemia Neg Hx     Sudden death Neg Hx        ROS- All systems are reviewed and negative except as per the HPI above   Physical Exam:    Vitals:    11/10/21 1016  BP: (!) 146/84  Pulse: (!) 102  SpO2: 99%  Weight: 88 kg  Height: _0  (1.727 m)       Wt Readings from Last 3 Encounters:  11/10/21 88 kg  11/03/21 88.9 kg  11/01/21  85.4 kg      Labs: Recent Labs       Lab Results  Component Value Date    NA 139 11/03/2021    K 4.3 11/03/2021    CL 100 11/03/2021    CO2 26 11/03/2021    GLUCOSE 94 11/03/2021    BUN 14 11/03/2021    CREATININE 1.28 (H) 11/03/2021    CALCIUM 9.3 11/03/2021    MG 1.9 11/03/2021      Recent Labs       Lab Results  Component Value Date    INR 1.1 02/03/2021      Recent Labs       Lab Results  Component Value Date    CHOL 154 04/13/2021    HDL 55 04/13/2021    [place] 83 04/13/2021    TRIG 87 04/13/2021          GEN- The patient is well appearing, alert and oriented x 3 today.   Head- normocephalic, atraumatic  Eyes-  Sclera clear, conjunctiva pink Ears- hearing intact Oropharynx- clear Neck- supple, no JVP Lymph- no cervical lymphadenopathy Lungs- Clear to ausculation bilaterally, normal work of breathing Heart- Regular rate and rhythm, no murmurs, rubs or gallops, PMI not laterally displaced GI- soft, NT, ND, + BS Extremities- no clubbing, cyanosis, or edema MS- no significant deformity or atrophy Skin- no rash or lesion Psych- euthymic mood, full affect Neuro- strength and sensation are intact   EKG-wide complex tachycardia at 102 bpm, qrs int 148 ms, qtc 512 ms (RBBB) contributing        Assessment and Plan:  1. Persistent asymptomatic afib with RVR Here for Tikosyn admit  No benadryl No chorthalidone since drug stopped many  days ago  Had covid early November so no testing required  Bmet/mag pending    2. CHA2DS2VASc  score of at least 5  States no missed anticoagulation x at least 3 weeks  Continue eliquis 5 mg bid    To 6E when bed is available   Butch Penny C. [facility] [address] [place], [place] [address] [phone]    EP Attending  Patient seen and examined. Agree with the findings as noted above. The patient presents today for consideration of treatment of his atrial fib. He is a pleasant  75 yo man with persistent atrial fib/flutter with a RVR who has been treated with rate control. He presents for initiation of dofetilide. He has not had syncope. He had Covid remotely about 7 weeks ago.   Carleene Overlie Kafi Dotter,MD

## 2021-11-10 NOTE — Care Management (Signed)
11-10-21 1553 Patient presented for Tikosyn Load. Benefits check in process. Case Manager will discuss cost with patient and pharmacy of choice as the patient progresses.

## 2021-11-11 ENCOUNTER — Other Ambulatory Visit (HOSPITAL_COMMUNITY): Payer: Self-pay

## 2021-11-11 LAB — BASIC METABOLIC PANEL
Anion gap: 7 (ref 5–15)
BUN: 8 mg/dL (ref 8–23)
CO2: 25 mmol/L (ref 22–32)
Calcium: 8.5 mg/dL — ABNORMAL LOW (ref 8.9–10.3)
Chloride: 103 mmol/L (ref 98–111)
Creatinine, Ser: 1.11 mg/dL (ref 0.61–1.24)
GFR, Estimated: >60 mL/min (ref >60)
Glucose, Bld: 107 mg/dL — ABNORMAL HIGH (ref 70–99)
Potassium: 3.8 mmol/L (ref 3.5–5.1)
Sodium: 135 mmol/L (ref 135–145)

## 2021-11-11 LAB — MAGNESIUM: Magnesium: 2.3 mg/dL (ref 1.7–2.4)

## 2021-11-11 MED ORDER — POTASSIUM CHLORIDE CRYS ER 20 MEQ PO TBCR
20.0000 meq | EXTENDED_RELEASE_TABLET | Freq: Two times a day (BID) | ORAL | Status: DC
  Administered 2021-11-12 – 2021-11-13 (×3): 20 meq via ORAL
  Filled 2021-11-11 (×3): qty 1

## 2021-11-11 MED ORDER — DILTIAZEM HCL 60 MG PO TABS
120.0000 mg | ORAL_TABLET | Freq: Four times a day (QID) | ORAL | Status: DC
  Administered 2021-11-11 (×2): 120 mg via ORAL
  Filled 2021-11-11 (×2): qty 2

## 2021-11-11 MED ORDER — POTASSIUM CHLORIDE CRYS ER 20 MEQ PO TBCR
40.0000 meq | EXTENDED_RELEASE_TABLET | Freq: Once | ORAL | Status: AC
  Administered 2021-11-11: 09:00:00 40 meq via ORAL
  Filled 2021-11-11: qty 2

## 2021-11-11 MED ORDER — SODIUM CHLORIDE 0.9 % IV SOLN: INTRAVENOUS | Status: DC

## 2021-11-11 NOTE — Care Management (Signed)
1152 11-11-21 Patient presented for Tikosyn Load. Case Manager spoke with the patient regarding co pay cost. Patient would like to have the initial Rx sent to [facility] and the Rx refills sent to [facility]. No further needs identified at this time.

## 2021-11-11 NOTE — Progress Notes (Addendum)
Progress Note  Patient Name: Patrick Roth Date of Encounter: 11/11/2021  [name] Cardiologist: Mertie Moores, MD   Subjective   Doing well, no new complaints  Inpatient Medications    Scheduled Meds:  apixaban  5 mg Oral BID   diltiazem  120 mg Oral Q6H   dofetilide  250 mcg Oral BID   famotidine  20 mg Oral Daily   magnesium oxide  400 mg Oral BID   [START ON 11/12/2021] potassium chloride SA  20 mEq Oral BID PC   sodium chloride flush  3 mL Intravenous Q12H   Continuous Infusions:  sodium chloride     PRN Meds: sodium chloride, acetaminophen, metoprolol tartrate, pneumococcal 23 valent vaccine, sodium chloride flush   Vital Signs    Vitals:   11/10/21 2054 11/11/21 0237 11/11/21 0553 11/11/21 0910  BP:  (!) 139/102 (!) 125/93 (!) 156/108  Pulse: 100  100   Resp: 18  18   Temp: 98.8 F (37.1 C)  98.5 F (36.9 C)   TempSrc: Oral  Oral   SpO2: 100%  100%     Intake/Output Summary (Last 24 hours) at 11/11/2021 0948 Last data filed at 11/11/2021 0400 Gross per 24 hour  Intake 373.35 ml  Output --  Net 373.35 ml   Last 3 Weights 11/10/2021 11/03/2021 11/01/2021  Weight (lbs) 194 lb 196 lb 188 lb 3.2 oz  Weight (kg) 87.998 kg 88.905 kg 85.367 kg      Telemetry    AFib 90's generally - Personally Reviewed  ECG    AFlutter 103bpm, QRS 110ms, QTc is difficult given flutter wave, appears stable - Personally Reviewed  Physical Exam    GEN: No acute distress.   Neck: No JVD Cardiac: irreg-irreg, no murmurs, rubs, or gallops.  Respiratory: CTA b/l. GI: Soft, nontender, non-distended  MS: No edema; No deformity. Neuro:  Nonfocal  Psych: Normal affect   Labs    High Sensitivity Troponin:   Recent Labs  Lab 10/27/21 1116 10/27/21 1315 10/28/21 1030 10/28/21 1215  TROPONINIHS 13 16 15  18*     Chemistry Recent Labs  Lab 11/10/21 1027 11/10/21 1852 11/11/21 0157  NA 138  --  135  K 3.4* 4.2 3.8  CL 107  --  103  CO2 24  --  25   GLUCOSE 99  --  107*  BUN 8  --  8  CREATININE 1.32*  --  1.11  CALCIUM 9.1  --  8.5*  MG 1.9  --  2.3  GFRNONAA 56*  --  >60  ANIONGAP 7  --  7    Lipids No results for input(s): CHOL, TRIG, HDL, LABVLDL, LDLCALC, CHOLHDL in the last 168 hours.  HematologyNo results for input(s): WBC, RBC, HGB, HCT, MCV, MCH, MCHC, RDW, PLT in the last 168 hours. Thyroid No results for input(s): TSH, FREET4 in the last 168 hours.  BNPNo results for input(s): BNP, PROBNP in the last 168 hours.  DDimer No results for input(s): DDIMER in the last 168 hours.   Radiology    No results found.  Cardiac Studies   10/31/21: TTE IMPRESSIONS   1. Inferior basal hypokinesis . Left ventricular ejection fraction, by  estimation, is 50 to 55%. The left ventricle has low normal function. The  left ventricle demonstrates regional wall motion abnormalities (see  scoring diagram/findings for  description). There is mild left ventricular hypertrophy. Left ventricular  diastolic parameters were normal.   2. Right ventricular systolic function  is normal. The right ventricular  size is normal. There is normal pulmonary artery systolic pressure.   3. Left atrial size was mildly dilated.   4. The mitral valve is degenerative. Mild mitral valve regurgitation. No  evidence of mitral stenosis. Moderate mitral annular calcification.   5. The aortic valve is tricuspid. Aortic valve regurgitation is not  visualized. Aortic valve sclerosis/calcification is present, without any  evidence of aortic stenosis.   6. The inferior vena cava is normal in size with greater than 50%  respiratory variability, suggesting right atrial pressure of 3 mmHg.    Nov 2022, monitor (7 days) Predominant rhythm was sinus rhythm 1% ventricular ectopy 6.2% supraventricular ectopy Multiple SVT episodes, all less than 20 seconds No symptoms recorded     Nov 2021 Monitoring Max 215 bpm 02:28pm, 10/28 Min 82 bpm 11:16am, 10/28 Avg 112  bpm 3.3% PVC burden, rare supraventricular ectopy Atrial fibrillation/flutter occurred 100% of the time Longest ventricular run 10 seconds at 161 bpm No symptoms recorded       02/24/2018: stress myoview Nuclear stress EF: 58%. Normal perfusion. No ischemia This is a low risk study.       02/24/2018: TTE Study Conclusions  - Left ventricle: The cavity size was normal. Systolic function was    normal. The estimated ejection fraction was in the range of 55%    to 60%. Wall motion was normal; there were no regional wall    motion abnormalities. There was a reduced contribution of atrial    contraction to ventricular filling, due to increased ventricular    diastolic pressure or atrial contractile dysfunction. The study    is not technically sufficient to allow evaluation of LV diastolic    function. (suspect left atrial mechanical failure, possible    stunning after recent episode of atrial fibrillation versus    chronic atrial dysfunction)  - Left atrium: The atrium was mildly dilated.  - Right ventricle: The cavity size was mildly dilated. Wall    thickness was normal.  - Right atrium: The atrium was mildly dilated.  - Pulmonary arteries: Systolic pressure was moderately increased.    PA peak pressure: 60 mm Hg (S).    6./15/18: 48 hor holter Minimum HR: 37 BPM at 12:18:54 PM Maximum HR: 124 BPM at 7:11:26 PM Average HR: 50 BPM 1% ventricular beats 4% supraventricular beats Ventricular runs of 6 beats Supraventricular runs of 13 beats   Zero atrial fibrillaiton noted on monitor. Average HR 50. No med changes at this time.  Patrick Roth Patrick Bears, MD   10/01/16 TTE - Left ventricle: The cavity size was normal. Systolic function was   normal. The estimated ejection fraction was in the range of 55%   to 60%. Wall motion was normal; there were no regional wall   motion abnormalities. Doppler parameters are consistent with a   reversible restrictive pattern, indicative of decreased  left   ventricular diastolic compliance and/or increased left atrial   pressure (grade 3 diastolic dysfunction). Doppler parameters are   consistent with high ventricular filling pressure. - Aortic valve: Transvalvular velocity was within the normal range.   There was no stenosis. - Mitral valve: Transvalvular velocity was within the normal range.   There was no evidence for stenosis. There was mild regurgitation. - Right ventricle: The cavity size was mildly dilated. Wall   thickness was normal. Systolic function was normal. - Atrial septum: No defect or patent foramen ovale was identified   by color flow Doppler. -  Tricuspid valve: There was mild regurgitation. - Pulmonary arteries: Systolic pressure was moderately to severely   increased. PA peak pressure: 57 mm Hg (S). - Pericardium, extracardiac: A trivial pericardial effusion was   identified.   10/10/15: lexiscan stress myoview There was no ST segment deviation noted during stress. No T wave inversion was noted during stress. The study is normal. This is a low risk study.  Low risk stress nuclear study with normal perfusion. Non gated study.  Patient Profile     75 y.o. male AFib, HTN, CVA,  HLD, syncope attributed to orthostasis in 2017, h/o medication non complinace, another stroke Feb 2022 (compliant with his [place] at the time)  >> polycythemia admitted for Tikosyn initiation     AFib Hx Diagnosed 2017   AAD hx Started March 2019 >> seems self stopped sometime in the Fall 2021 2/2 making him feel cold and lowered his pulse Toprol was stopped by the patient making him feel bad, poorly tolerated  diltiazem started Dec 2022  Assessment & Plan    Paroxysmal AFib CHA2DS2Vasc is 5, on Eliquis, appropriately dosed Tikosyn load is in progress K+ 3.8 (replaced) Mag 2.3 Creat 1.11, baseline is typically higher, continue 286mcg QTc difficult given includes flutter wave, QRS is 170 as well, reviewed with Dr. Curt Roth, [place] to  continue  DCCV tomorrow if not in SR Pt is aware and agreeable   HTN Looks ok   GI complaints (chronic) Hold off EGD until tikosyn load and uninterrupted [place]  4. Recurrent dizziness, near syncope This is felt to be multifactorial + symptoms triggered by abd cramping/straining for BM (vagal) without HR abnormalities as well as RVR   For questions or updates, please contact [place] Please consult [email or web address] for contact info under        Signed, Baldwin Jamaica, PA-C  11/11/2021, 9:48 AM    I have seen and examined this patient with Tommye Standard.  Agree with above, note added to reflect my findings.  Feeling well. Remains in AF. Ready for DCCV tomorrow  GEN: Well nourished, well developed, in no acute distress  HEENT: normal  Neck: no JVD, carotid bruits, or masses Cardiac: irregular; no murmurs, rubs, or gallops,no edema  Respiratory:  clear to auscultation bilaterally, normal work of breathing GI: soft, nontender, nondistended, + BS MS: no deformity or atrophy  Skin: warm and dry Neuro:  Strength and sensation are intact Psych: euthymic mood, full affect   Persistent atrial fibrillation: remains in AF. Loading on tikosyn. If he does not convert to sinus rhythm Kaiyan Luczak plan DCCV tomorrow.   Cornisha Zetino M. Mayan Dolney MD 11/11/2021 11:26 AM

## 2021-11-11 NOTE — TOC Benefit Eligibility Note (Signed)
Patient Teacher, English as a foreign language completed.    The patient is currently admitted and upon discharge could be taking dofetilide (Tikosyn) 250 mcg.  The current 30 day co-pay is, $4.25.   The patient is insured through [place], [place] Patient Advocate Specialist [place] Patient Advocate Team Direct Number: [phone]  Fax: [phone]

## 2021-11-11 NOTE — Progress Notes (Signed)
Pharmacy: Dofetilide (Tikosyn) - Follow Up Assessment and Electrolyte Replacement  Pharmacy consulted to assist in monitoring and replacing electrolytes in this 75 y.o. male admitted on 11/10/2021 undergoing dofetilide initiation. First dofetilide dose: 11/10/21  Labs:    Component Value Date/Time   K 3.8 11/11/2021 0157   MG 2.3 11/11/2021 0157     Plan: Potassium: K 3.8-3.9:  Give KCl 40 mEq po x1   Magnesium: Mg > 2: No additional supplementation needed    Thank you for allowing pharmacy to participate in this patient's care   Einar Grad 11/11/2021  7:14 AM

## 2021-11-12 ENCOUNTER — Ambulatory Visit: Payer: Medicare HMO | Admitting: Cardiology

## 2021-11-12 ENCOUNTER — Encounter (HOSPITAL_COMMUNITY)
Admission: AD | Disposition: A | Discharge status: Home / Self Care | Payer: Self-pay | Source: Ambulatory Visit | Attending: Cardiology

## 2021-11-12 LAB — BASIC METABOLIC PANEL
Anion gap: 8 (ref 5–15)
BUN: 7 mg/dL — ABNORMAL LOW (ref 8–23)
CO2: 25 mmol/L (ref 22–32)
Calcium: 8.6 mg/dL — ABNORMAL LOW (ref 8.9–10.3)
Chloride: 102 mmol/L (ref 98–111)
Creatinine, Ser: 1.19 mg/dL (ref 0.61–1.24)
GFR, Estimated: >60 mL/min (ref >60)
Glucose, Bld: 87 mg/dL (ref 70–99)
Potassium: 3.7 mmol/L (ref 3.5–5.1)
Sodium: 135 mmol/L (ref 135–145)

## 2021-11-12 LAB — MAGNESIUM: Magnesium: 2.1 mg/dL (ref 1.7–2.4)

## 2021-11-12 SURGERY — CARDIOVERSION
Anesthesia: General

## 2021-11-12 MED ORDER — POLYETHYLENE GLYCOL 3350 17 G PO PACK: 17.0000 g | PACK | Freq: Every day | ORAL | Status: DC | PRN

## 2021-11-12 MED ORDER — DILTIAZEM HCL ER COATED BEADS 180 MG PO CP24
180.0000 mg | ORAL_CAPSULE | Freq: Every day | ORAL | Status: DC
  Administered 2021-11-12 – 2021-11-13 (×2): 180 mg via ORAL
  Filled 2021-11-12 (×2): qty 1

## 2021-11-12 MED ORDER — POTASSIUM CHLORIDE CRYS ER 20 MEQ PO TBCR
40.0000 meq | EXTENDED_RELEASE_TABLET | Freq: Once | ORAL | Status: AC
  Administered 2021-11-12: 08:00:00 40 meq via ORAL
  Filled 2021-11-12: qty 2

## 2021-11-12 NOTE — Discharge Summary (Addendum)
ELECTROPHYSIOLOGY PROCEDURE DISCHARGE SUMMARY    Patient ID: Patrick Roth,  MRN: [id number], DOB/AGE: [birth year] 75 y.o.  Admit date: 11/10/2021 Discharge date: 11/13/21  Primary Care Physician: Allwardt, [name], PA-C  Primary Cardiologist: Patrick. Acie Fredrickson Electrophysiologist: Patrick Roth  Primary Discharge Diagnosis:  1.  Paroxysmal atrial fibrillation status post Tikosyn loading this admission      CHA2DS2Vasc is 5, on Eliquis  Secondary Discharge Diagnosis:  HTN CVA hx Syncope Suspect to be multifactorial  Allergies  Allergen Reactions   Crestor [Rosuvastatin Calcium] Shortness Of Breath    Muscle cramps   Spironolactone Shortness Of Breath   Terazosin Shortness Of Breath, Palpitations and Other (See Comments)    Nerve pain   Amiodarone Other (See Comments)    Dropped pulse real low to 45-50, and made him feel very cold   Atenolol Other (See Comments)    Drowsiness and "flu sxs"   Bystolic [Nebivolol Hcl] Other (See Comments)    GI issues   Hydralazine Other (See Comments)    Joint swelling   Tizanidine Other (See Comments)    Syncope, Elevated BP/pulse   Amlodipine Other (See Comments)    unknown   Clonidine Other (See Comments)    unknown   Hydrocodone-Acetaminophen Other (See Comments)    unknown   Levofloxacin Other (See Comments)    unknown   Losartan Other (See Comments)    Did not work.  Pt states he couldn't take b/c of SE, but can't remember what SE were.   Maxzide [Hydrochlorothiazide W-Triamterene] Other (See Comments)    Does not tolerate potassium-sparing diuretics   Olmesartan Other (See Comments)    unknown   Tape Other (See Comments)    Other reaction(s): Unknown   Tussionex Pennkinetic Er [Hydrocod Polst-Cpm Polst Er] Other (See Comments)    Other reaction(s): Unknown     Procedures This Admission:  1.  Tikosyn loading   Brief HPI: Patrick Roth is a 75 y.o. male with a past medical history as noted above.  He is followed by  EP in the outpatient setting for treatment options of atrial fibrillation.  Risks, benefits, and alternatives to Tikosyn were reviewed with the patient who wished to proceed.   His syncope felt to be multifactorial 2/2 vagal, known history of orthostasis, and RVR.  He has not been found to have symptomatic bradycardia  Hospital Course:  The patient was admitted and Tikosyn was initiated.  Renal function and electrolytes were followed during the hospitalization.  The patient's QTc remained stable.   He was monitored until discharge on telemetry which demonstrated SR, 1st degree AVblock.  On the day of discharge, he feels well, was examined by Patrick Roth who considered the patient stable for discharge to home.  Follow-up has been arranged with the AFib clinic in 1 week and with Patrick Roth in 4 weeks.   Tikosyn teaching was completed electrolyte replacement for home Patrick Roth need to increase his potassium to 24meq TID, no additional magnesium to his current home dose His diltiazem is being reduced to 180mg  Daily   He reports an unknown stool softener that he was given samples of from his GI MD that he does not know the name of.  I have discussed with him, NOT TO take this until cleared by [place] or a pharmacist.  He has not been taking it while here and has not had issues with his BM. He understands the importance of this.  Physical Exam: Vitals:  11/12/21 0730 11/12/21 2029 11/13/21 0600 11/13/21 0817  BP:  126/82 (!) 147/92 (!) 133/92  Pulse:  (!) 56 (!) 57   Resp: 17 18 19    Temp: 98.4 F (36.9 C) 98.4 F (36.9 C) 98.9 F (37.2 C)   TempSrc: Oral Oral Oral   SpO2:  100% 100%     GEN- The patient is well appearing, alert and oriented x 3 today.   HEENT: normocephalic, atraumatic; sclera clear, conjunctiva pink; hearing intact; oropharynx clear; neck supple, no JVP Lymph- no cervical lymphadenopathy Lungs- CTA b/l, normal work of breathing.  No wheezes, rales, rhonchi Heart- RRR, no murmurs,  rubs or gallops, PMI not laterally displaced GI- soft, non-tender, non-distended Extremities- no clubbing, cyanosis, or edema MS- no significant deformity or atrophy Skin- warm and dry, no rash or lesion Psych- euthymic mood, full affect Neuro- strength and sensation are intact   Labs:   Lab Results  Component Value Date   WBC 7.0 10/29/2021   HGB 15.6 10/29/2021   HCT 47.7 10/29/2021   MCV 89.0 10/29/2021   PLT 203 10/29/2021    Recent Labs  Lab 11/13/21 0147  NA 136  K 4.3  CL 103  CO2 29  BUN 10  CREATININE 1.25*  CALCIUM 8.9  GLUCOSE 92     Discharge Medications:  Allergies as of 11/13/2021       Reactions   Crestor [rosuvastatin Calcium] Shortness Of Breath   Muscle cramps   Spironolactone Shortness Of Breath   Terazosin Shortness Of Breath, Palpitations, Other (See Comments)   Nerve pain   Amiodarone Other (See Comments)   Dropped pulse real low to 45-50, and made him feel very cold   Atenolol Other (See Comments)   Drowsiness and "flu sxs"   Bystolic [nebivolol Hcl] Other (See Comments)   GI issues   Hydralazine Other (See Comments)   Joint swelling   Tizanidine Other (See Comments)   Syncope, Elevated BP/pulse   Amlodipine Other (See Comments)   unknown   Clonidine Other (See Comments)   unknown   Hydrocodone-acetaminophen Other (See Comments)   unknown   Levofloxacin Other (See Comments)   unknown   Losartan Other (See Comments)   Did not work.  Pt states he couldn't take b/c of SE, but can't remember what SE were.   Maxzide [hydrochlorothiazide W-triamterene] Other (See Comments)   Does not tolerate potassium-sparing diuretics   Olmesartan Other (See Comments)   unknown   Tape Other (See Comments)   Other reaction(s): Unknown   Tussionex Pennkinetic Er [hydrocod Polst-cpm Polst Er] Other (See Comments)   Other reaction(s): Unknown        Medication List     STOP taking these medications    diltiazem 120 MG tablet Commonly known  as: CARDIZEM       TAKE these medications    acetaminophen 650 MG CR tablet Commonly known as: TYLENOL Take 1,300 mg by mouth every 8 (eight) hours as needed for pain.   amLODipine 10 MG tablet Commonly known as: NORVASC Take 10 mg by mouth daily.   apixaban 5 MG Tabs tablet Commonly known as: Eliquis Take 1 tablet (5 mg total) by mouth 2 (two) times daily.   dicyclomine 20 MG tablet Commonly known as: BENTYL Take 1 tablet (20 mg total) by mouth every 6 (six) hours as needed for spasms.   diltiazem 180 MG 24 hr capsule Commonly known as: CARDIZEM CD Take 1 capsule (180 mg total) by mouth daily.  Start taking on: November 14, 2021   dofetilide 250 MCG capsule Commonly known as: TIKOSYN Take 1 capsule (250 mcg total) by mouth 2 (two) times daily.   famotidine 20 MG tablet Commonly known as: PEPCID Take 1 tablet (20 mg total) by mouth daily.   Magnesium Oxide 400 MG Caps Take 1 capsule (400 mg total) by mouth 2 (two) times daily.   metoprolol tartrate 25 MG tablet Commonly known as: LOPRESSOR Take 1 tablet (25 mg total) by mouth 2 (two) times daily as needed (FOR HEARTRATE HIGHER THAN 120 bpm).   oxyCODONE-acetaminophen 5-325 MG tablet Commonly known as: PERCOCET/ROXICET Take 1 tablet by mouth every 4 (four) hours as needed for severe pain.   polyethylene glycol powder 17 GM/SCOOP powder Commonly known as: GLYCOLAX/MIRALAX Take 17 g by mouth in the morning and at bedtime. Start with 1 scoop 2 times per day until bowels are moving, then reduce to 1 scoop daily. Drink 64oz of water daily.   potassium chloride SA 20 MEQ tablet Commonly known as: KLOR-CON M Take 1 tablet (20 mEq total) by mouth 3 (three) times daily. What changed: when to take this   PRESCRIPTION MEDICATION Take 1 tablet by mouth daily. Stool softener - not sure about name - got samples from doctor's Roth. Notes to patient: DO NOT TAKE THIS MEDICINE WITH TIKOSYN (DOFETILIDE) UNTIL YOU HAVE CLEARED  IT BY A PHARMACIST OR THE AFIB CLINIC/Patrick. Najir Roop'S Roth   Repatha SureClick 628 MG/ML Soaj Generic drug: Evolocumab Inject 1 pen into the skin every 14 (fourteen) days.        Disposition: Home Discharge Instructions     Diet - low sodium heart healthy   Complete by: As directed    Increase activity slowly   Complete by: As directed         Duration of Discharge Encounter: Greater than 30 minutes including physician time.  Patrick Night, PA-C 11/13/2021 12:04 PM  I have seen and examined this patient with Patrick Roth.  Agree with above, note added to reflect my findings.  Admitted to the hospital for dofetilide load.  Converted to sinus rhythm with dofetilide initiation.  On exam, regular rhythm, no murmurs, lungs clear.  Patient feeling well.  QTC is remained stable.  We Sapna Padron plan for discharge today with follow-up in clinic.  Greater than 30 minutes of physician time was used for in planning and discharge of this patient.  Rahm Minix M. Collyn Selk MD 11/14/2021 8:09 AM

## 2021-11-12 NOTE — Progress Notes (Addendum)
Progress Note  Patient Name: Patrick Roth Date of Encounter: 11/12/2021  [facility] Cardiologist: Mertie Moores, MD   Subjective   Doing well, no new complaints  Inpatient Medications    Scheduled Meds:  apixaban  5 mg Oral BID   diltiazem  180 mg Oral Daily   dofetilide  250 mcg Oral BID   famotidine  20 mg Oral Daily   magnesium oxide  400 mg Oral BID   potassium chloride SA  20 mEq Oral BID PC   potassium chloride  40 mEq Oral Once   sodium chloride flush  3 mL Intravenous Q12H   Continuous Infusions:  sodium chloride     sodium chloride 20 mL/hr at 11/11/21 1600   PRN Meds: sodium chloride, acetaminophen, metoprolol tartrate, pneumococcal 23 valent vaccine, sodium chloride flush   Vital Signs    Vitals:   11/11/21 1320 11/11/21 1515 11/11/21 2004 11/12/21 0442  BP: (!) 133/96 (!) 144/107 (!) 125/99 134/80  Pulse: 95  79 (!) 54  Resp: 18  19 18   Temp: 97.6 F (36.4 C)  97.6 F (36.4 C) 97.6 F (36.4 C)  TempSrc: Oral  Oral Oral  SpO2: 100%  100% 100%    Intake/Output Summary (Last 24 hours) at 11/12/2021 0733 Last data filed at 11/12/2021 0442 Gross per 24 hour  Intake 95.44 ml  Output 400 ml  Net -304.56 ml   Last 3 Weights 11/10/2021 11/03/2021 11/01/2021  Weight (lbs) 194 lb 196 lb 188 lb 3.2 oz  Weight (kg) 87.998 kg 88.905 kg 85.367 kg      Telemetry    AFib 90's > SB 50's 1st degree AVBLock - Personally Reviewed  ECG    SB 53bpm, RBBB, 1st degree AVblock, LAD, manually measured AT 473ms, QTc with RBBB is OK- Personally Reviewed  Physical Exam    GEN: No acute distress.   Neck: No JVD Cardiac: RRR, no murmurs, rubs, or gallops.  Respiratory: CTA b/l. GI: Soft, nontender, non-distended  MS: No edema; No deformity. Neuro:  Nonfocal  Psych: Normal affect   Labs    High Sensitivity Troponin:   Recent Labs  Lab 10/27/21 1116 10/27/21 1315 10/28/21 1030 10/28/21 1215  TROPONINIHS 13 16 15  18*     Chemistry Recent Labs   Lab 11/10/21 1027 11/10/21 1852 11/11/21 0157 11/12/21 0440  NA 138  --  135 135  K 3.4* 4.2 3.8 3.7  CL 107  --  103 102  CO2 24  --  25 25  GLUCOSE 99  --  107* 87  BUN 8  --  8 7*  CREATININE 1.32*  --  1.11 1.19  CALCIUM 9.1  --  8.5* 8.6*  MG 1.9  --  2.3 2.1  GFRNONAA 56*  --  >60 >60  ANIONGAP 7  --  7 8    Lipids No results for input(s): CHOL, TRIG, HDL, LABVLDL, LDLCALC, CHOLHDL in the last 168 hours.  HematologyNo results for input(s): WBC, RBC, HGB, HCT, MCV, MCH, MCHC, RDW, PLT in the last 168 hours. Thyroid No results for input(s): TSH, FREET4 in the last 168 hours.  BNPNo results for input(s): BNP, PROBNP in the last 168 hours.  DDimer No results for input(s): DDIMER in the last 168 hours.   Radiology    No results found.  Cardiac Studies   10/31/21: TTE IMPRESSIONS   1. Inferior basal hypokinesis . Left ventricular ejection fraction, by  estimation, is 50 to 55%. The left  ventricle has low normal function. The  left ventricle demonstrates regional wall motion abnormalities (see  scoring diagram/findings for  description). There is mild left ventricular hypertrophy. Left ventricular  diastolic parameters were normal.   2. Right ventricular systolic function is normal. The right ventricular  size is normal. There is normal pulmonary artery systolic pressure.   3. Left atrial size was mildly dilated.   4. The mitral valve is degenerative. Mild mitral valve regurgitation. No  evidence of mitral stenosis. Moderate mitral annular calcification.   5. The aortic valve is tricuspid. Aortic valve regurgitation is not  visualized. Aortic valve sclerosis/calcification is present, without any  evidence of aortic stenosis.   6. The inferior vena cava is normal in size with greater than 50%  respiratory variability, suggesting right atrial pressure of 3 mmHg.    Nov 2022, monitor (7 days) Predominant rhythm was sinus rhythm 1% ventricular ectopy 6.2%  supraventricular ectopy Multiple SVT episodes, all less than 20 seconds No symptoms recorded     Nov 2021 Monitoring Max 215 bpm 02:28pm, 10/28 Min 82 bpm 11:16am, 10/28 Avg 112 bpm 3.3% PVC burden, rare supraventricular ectopy Atrial fibrillation/flutter occurred 100% of the time Longest ventricular run 10 seconds at 161 bpm No symptoms recorded       02/24/2018: stress myoview Nuclear stress EF: 58%. Normal perfusion. No ischemia This is a low risk study.       02/24/2018: TTE Study Conclusions  - Left ventricle: The cavity size was normal. Systolic function was    normal. The estimated ejection fraction was in the range of 55%    to 60%. Wall motion was normal; there were no regional wall    motion abnormalities. There was a reduced contribution of atrial    contraction to ventricular filling, due to increased ventricular    diastolic pressure or atrial contractile dysfunction. The study    is not technically sufficient to allow evaluation of LV diastolic    function. (suspect left atrial mechanical failure, possible    stunning after recent episode of atrial fibrillation versus    chronic atrial dysfunction)  - Left atrium: The atrium was mildly dilated.  - Right ventricle: The cavity size was mildly dilated. Wall    thickness was normal.  - Right atrium: The atrium was mildly dilated.  - Pulmonary arteries: Systolic pressure was moderately increased.    PA peak pressure: 60 mm Hg (S).    6./15/18: 48 hor holter Minimum HR: 37 BPM at 12:18:54 PM Maximum HR: 124 BPM at 7:11:26 PM Average HR: 50 BPM 1% ventricular beats 4% supraventricular beats Ventricular runs of 6 beats Supraventricular runs of 13 beats   Zero atrial fibrillaiton noted on monitor. Average HR 50. No med changes at this time.  Grainne Knights [name], MD   10/01/16 TTE - Left ventricle: The cavity size was normal. Systolic function was   normal. The estimated ejection fraction was in the range of 55%   to  60%. Wall motion was normal; there were no regional wall   motion abnormalities. Doppler parameters are consistent with a   reversible restrictive pattern, indicative of decreased left   ventricular diastolic compliance and/or increased left atrial   pressure (grade 3 diastolic dysfunction). Doppler parameters are   consistent with high ventricular filling pressure. - Aortic valve: Transvalvular velocity was within the normal range.   There was no stenosis. - Mitral valve: Transvalvular velocity was within the normal range.   There was no evidence for stenosis.  There was mild regurgitation. - Right ventricle: The cavity size was mildly dilated. Wall   thickness was normal. Systolic function was normal. - Atrial septum: No defect or patent foramen ovale was identified   by color flow Doppler. - Tricuspid valve: There was mild regurgitation. - Pulmonary arteries: Systolic pressure was moderately to severely   increased. PA peak pressure: 57 mm Hg (S). - Pericardium, extracardiac: A trivial pericardial effusion was   identified.   10/10/15: lexiscan stress myoview There was no ST segment deviation noted during stress. No T wave inversion was noted during stress. The study is normal. This is a low risk study.  Low risk stress nuclear study with normal perfusion. Non gated study.  Patient Profile     75 y.o. male AFib, HTN, CVA,  HLD, syncope attributed to orthostasis in 2017, h/o medication non complinace, another stroke Feb 2022 (compliant with his [place] at the time)  >> polycythemia admitted for Tikosyn initiation     AFib Hx Diagnosed 2017   AAD hx Started March 2019 >> seems self stopped sometime in the Fall 2021 2/2 making him feel cold and lowered his pulse Toprol was stopped by the patient making him feel bad, poorly tolerated  diltiazem started Dec 2022  Assessment & Plan    Paroxysmal AFib CHA2DS2Vasc is 5, on Eliquis, appropriately dosed Tikosyn load is in  progress K+ 3.7 (replaced) Mag 2.1 Creat 1.19, baseline is typically higher, continue 226mcg QTc is OK  Converted with drug Anticipate discharge tomorrow  Baseline conduction system disease, [place] Bueche reduce his dilt now   HTN Looks ok   GI complaints (chronic) Hold off EGD until tikosyn load and uninterrupted [place]  4. Recurrent dizziness, near syncope This is felt to be multifactorial + symptoms triggered by abd cramping/straining for BM (vagal) without HR abnormalities as well as RVR   For questions or updates, please contact [place] Please consult [email or web address] for contact info under        Signed, Baldwin [place], PA-C  11/12/2021, 7:33 AM    I have seen and examined this patient with Tommye Standard.  Agree with above, note added to reflect my findings.  To sinus rhythm overnight after third dose of dofetilide.  Currently feeling well.  GEN: Well nourished, well developed, in no acute distress  HEENT: normal  Neck: no JVD, carotid bruits, or masses Cardiac: RRR; no murmurs, rubs, or gallops,no edema  Respiratory:  clear to auscultation bilaterally, normal work of breathing GI: soft, nontender, nondistended, + BS MS: no deformity or atrophy  Skin: warm and dry Neuro:  Strength and sensation are intact Psych: euthymic mood, full affect   Persistent atrial fibrillation: Currently on dofetilide and Eliquis.  He is fortunately converted to normal rhythm.  We Jazziel Fitzsimmons continue with current dose of dofetilide.  Likely plan for discharge tomorrow morning.  Jamella Grayer M. Zakhi Dupre MD 11/12/2021 7:46 AM

## 2021-11-12 NOTE — Progress Notes (Signed)
Pharmacy: Dofetilide (Tikosyn) - Follow Up Assessment and Electrolyte Replacement  Pharmacy consulted to assist in monitoring and replacing electrolytes in this 75 y.o. male admitted on 11/10/2021 undergoing dofetilide initiation. First dofetilide dose: 11/10/21  Labs:    Component Value Date/Time   K 3.7 11/12/2021 0440   MG 2.1 11/12/2021 0440     Plan: Potassium: K 3.5-3.7:  Give KCl 60 mEq po x1  >> give 40 mEQ x1 then continue 20 mEq BID (home regimen)  Magnesium: Mg > 2: No additional supplementation needed    Thank you for allowing pharmacy to participate in this patient's care   Einar Grad 11/12/2021  7:23 AM

## 2021-11-13 ENCOUNTER — Other Ambulatory Visit (HOSPITAL_COMMUNITY): Payer: Self-pay

## 2021-11-13 LAB — MAGNESIUM: Magnesium: 2.2 mg/dL (ref 1.7–2.4)

## 2021-11-13 LAB — BASIC METABOLIC PANEL
Anion gap: 4 — ABNORMAL LOW (ref 5–15)
BUN: 10 mg/dL (ref 8–23)
CO2: 29 mmol/L (ref 22–32)
Calcium: 8.9 mg/dL (ref 8.9–10.3)
Chloride: 103 mmol/L (ref 98–111)
Creatinine, Ser: 1.25 mg/dL — ABNORMAL HIGH (ref 0.61–1.24)
GFR, Estimated: 60 mL/min — ABNORMAL LOW (ref >60)
Glucose, Bld: 92 mg/dL (ref 70–99)
Potassium: 4.3 mmol/L (ref 3.5–5.1)
Sodium: 136 mmol/L (ref 135–145)

## 2021-11-13 MED ORDER — DOFETILIDE 250 MCG PO CAPS
250.0000 ug | ORAL_CAPSULE | Freq: Two times a day (BID) | ORAL | 5 refills | Status: DC
  Filled 2021-11-13: qty 60, 30d supply, fill #0

## 2021-11-13 MED ORDER — DILTIAZEM HCL ER COATED BEADS 180 MG PO CP24
180.0000 mg | ORAL_CAPSULE | Freq: Every day | ORAL | 5 refills | Status: DC
  Filled 2021-11-13: qty 30, 30d supply, fill #0

## 2021-11-13 MED ORDER — POTASSIUM CHLORIDE CRYS ER 20 MEQ PO TBCR
20.0000 meq | EXTENDED_RELEASE_TABLET | Freq: Three times a day (TID) | ORAL | 5 refills | Status: DC
  Filled 2021-11-13: qty 90, 30d supply, fill #0

## 2021-11-13 NOTE — Care Management Important Message (Signed)
Important Message  Patient Details  Name: Patrick Roth MRN: [id number] Date of Birth: 04-23-1946   Medicare Important Message Given:  Yes     Shelda Altes 11/13/2021, 8:52 AM

## 2021-11-13 NOTE — Progress Notes (Signed)
Pharmacy: Dofetilide (Tikosyn) - Follow Up Assessment and Electrolyte Replacement  Pharmacy consulted to assist in monitoring and replacing electrolytes in this 75 y.o. male admitted on 11/10/2021 undergoing dofetilide initiation. First dofetilide dose: 11/10/21  Labs:    Component Value Date/Time   K 4.3 11/13/2021 0147   MG 2.2 11/13/2021 0147     Plan: Potassium: K >/= 4: No additional supplementation needed  Magnesium: Mg > 2: No additional supplementation needed  Pt has required 40 mEq KCl daily on average, was on 20 mEq BID at home so would just continue this on discharge.  Thank you for allowing pharmacy to participate in this patient's care   Arrie Senate, PharmD, BCPS, [facility] Clinical Pharmacist [phone] Please check AMION for all [place] numbers 11/13/2021

## 2021-11-18 ENCOUNTER — Telehealth: Payer: Self-pay | Admitting: Cardiology

## 2021-11-18 NOTE — Telephone Encounter (Signed)
Pt called with BP elevation and tremors all over.  Denies chest pain or fevers colds. He had an episode last night as well.  Lasts about 40 min and resolves.  He does not feel his heart racing like it did with atrial fib.     He is to be seen in A fib clinic tomorrow.  I asked him to call 911 if it happens again.  He agreed with that plan.  I will send message to Roderic Palau, NP in a fib clinic as well.

## 2021-11-19 ENCOUNTER — Ambulatory Visit (HOSPITAL_COMMUNITY)
Admission: RE | Admit: 2021-11-19 | Discharge: 2021-11-19 | Disposition: A | Discharge status: Home / Self Care | Payer: Medicare HMO | Source: Ambulatory Visit | Attending: Nurse Practitioner | Admitting: Nurse Practitioner

## 2021-11-19 ENCOUNTER — Other Ambulatory Visit: Payer: Self-pay

## 2021-11-19 DIAGNOSIS — E785 Hyperlipidemia, unspecified: Secondary | ICD-10-CM | POA: Diagnosis not present

## 2021-11-19 DIAGNOSIS — I4510 Unspecified right bundle-branch block: Secondary | ICD-10-CM | POA: Diagnosis not present

## 2021-11-19 DIAGNOSIS — Z7901 Long term (current) use of anticoagulants: Secondary | ICD-10-CM | POA: Insufficient documentation

## 2021-11-19 DIAGNOSIS — Z8673 Personal history of transient ischemic attack (TIA), and cerebral infarction without residual deficits: Secondary | ICD-10-CM | POA: Insufficient documentation

## 2021-11-19 DIAGNOSIS — Z79899 Other long term (current) drug therapy: Secondary | ICD-10-CM | POA: Diagnosis not present

## 2021-11-19 DIAGNOSIS — I10 Essential (primary) hypertension: Secondary | ICD-10-CM | POA: Diagnosis not present

## 2021-11-19 DIAGNOSIS — I4819 Other persistent atrial fibrillation: Secondary | ICD-10-CM | POA: Diagnosis not present

## 2021-11-19 LAB — MAGNESIUM: Magnesium: 2.2 mg/dL (ref 1.7–2.4)

## 2021-11-19 LAB — BASIC METABOLIC PANEL
Anion gap: 7 (ref 5–15)
BUN: 8 mg/dL (ref 8–23)
CO2: 28 mmol/L (ref 22–32)
Calcium: 9.2 mg/dL (ref 8.9–10.3)
Chloride: 101 mmol/L (ref 98–111)
Creatinine, Ser: 1.14 mg/dL (ref 0.61–1.24)
GFR, Estimated: >60 mL/min (ref >60)
Glucose, Bld: 130 mg/dL — ABNORMAL HIGH (ref 70–99)
Potassium: 3.7 mmol/L (ref 3.5–5.1)
Sodium: 136 mmol/L (ref 135–145)

## 2021-11-19 MED ORDER — DOFETILIDE 250 MCG PO CAPS: 250.0000 ug | ORAL_CAPSULE | Freq: Two times a day (BID) | ORAL | 5 refills | Status: DC

## 2021-11-19 NOTE — Progress Notes (Signed)
Primary Care Physician: Allwardt, [name], PA-C Referring Physician: Dr. Christiana Pellant is a 75 y.o. male with a h/o   AFib, HTN, CVA,  HLD, syncope attributed to orthostasis in 2017, h/o medication non complinace, another stroke Feb 2022 (compliant with his [place] at the time)  >> polycythemia.   He is here for tikosyn admit as he has had many recent ER visits for afib with RVR.  Most recent ER visit was 12/6  with near syncope associated with straining to have a BM, his HR went up and he took a couple doses of PRN lopressor went to the ER he was in AFib 120s, his HR settled to 90's-110's, he was feeling well and sent home He returned 12/7 and was admitted this time though it seems for diarrhea, not near syncope. He was in AFib w/RVR and rates getting to 200's, he got lopressor in the ER with improvement in his HRs, admitted for further management. At this time, it was decided that he would be admitted at a later date for Tikosyn as he had to allow 7 day wash out of chlorthalidone.   He is now in the afib clinic for admit for Tikosyn load. He has not missed any anticoagulation, no benadryl use, has been off chlorthalidone since hospitalization. He has  conduction disease with  a wide qrs complex, qt prolonged at 516 ms, RBBB contributing.   F/u in afib clinic, one week s/p tkosyn load. He did convert with drug, did not require cardioversion.  He has noted 2 episodes of elevated HR but did not last long since being home. He has chronic c/o's " for years" of a  feeling that his legs are swollen and then will feel bloated and warm in his abdomen, will last for 30 mins and then goes away. Sometimes he will feel this same sensation with a BM.  He is being compliant with tikosyn.   Today, he denies symptoms of palpitations, chest pain, shortness of breath, orthopnea, PND, lower extremity edema, dizziness, presyncope, syncope, or neurologic sequela. The patient is tolerating medications without  difficulties and is otherwise without complaint today.   Past Medical History:  Diagnosis Date   Atrial fibrillation/flutter    Intol of amio, beta blocker // Echo 12/22: inf HK, EF 50-55, normal RVSF, mild LaE, mild MR, mod MAC, AV sclerosis w/o aS   Colon polyps    Constipation 05/27/2015   DOE (dyspnea on exertion) 10/13/2015   Faintness 06/20/2016   History of chicken pox    Hyperlipidemia    Hypertension    Internal hemorrhoids    Polycythemia    Sciatica    Stroke ([place])    Hx of stroke in2008; recurrent CVA in 12/2020   Syncope and collapse 06/28/2016   Past Surgical History:  Procedure Laterality Date   CARDIOVERSION N/A 09/02/2017   Procedure: CARDIOVERSION;  Surgeon: [name], MD;  Location: [place];  Service: Cardiovascular;  Laterality: N/A;   COLONOSCOPY  2011   great toe surgery     PROSTATE BIOPSY      Current Outpatient Medications  Medication Sig Dispense Refill   acetaminophen (TYLENOL) 650 MG CR tablet Take 1,300 mg by mouth every 8 (eight) hours as needed for pain.     amLODipine (NORVASC) 10 MG tablet Take 10 mg by mouth daily.     apixaban (ELIQUIS) 5 MG TABS tablet Take 1 tablet (5 mg total) by mouth 2 (two) times daily.  180 tablet 1   dicyclomine (BENTYL) 20 MG tablet Take 1 tablet (20 mg total) by mouth every 6 (six) hours as needed for spasms. 30 tablet 0   diltiazem (CARDIZEM CD) 180 MG 24 hr capsule Take 1 capsule (180 mg total) by mouth daily. 30 capsule 5   Evolocumab (REPATHA SURECLICK) 301 MG/ML SOAJ Inject 1 pen into the skin every 14 (fourteen) days. 6 mL 3   famotidine (PEPCID) 20 MG tablet Take 1 tablet (20 mg total) by mouth daily. 30 tablet 0   Magnesium Oxide 400 MG CAPS Take 1 capsule (400 mg total) by mouth 2 (two) times daily. 60 capsule 0   metoprolol tartrate (LOPRESSOR) 25 MG tablet Take 1 tablet (25 mg total) by mouth 2 (two) times daily as needed (FOR HEARTRATE HIGHER THAN 120 bpm). 90 tablet 0   oxyCODONE-acetaminophen  (PERCOCET/ROXICET) 5-325 MG tablet Take 1 tablet by mouth every 4 (four) hours as needed for severe pain. 12 tablet 0   polyethylene glycol powder (GLYCOLAX/MIRALAX) 17 GM/SCOOP powder Take 17 g by mouth in the morning and at bedtime. Start with 1 scoop 2 times per day until bowels are moving, then reduce to 1 scoop daily. Drink 64oz of water daily. 3350 g 1   potassium chloride SA (KLOR-CON M) 20 MEQ tablet Take 1 tablet (20 mEq total) by mouth 3 (three) times daily. 90 tablet 5   PRESCRIPTION MEDICATION Take 1 tablet by mouth daily. Stool softener - not sure about name - got samples from doctor's office.     dofetilide (TIKOSYN) 250 MCG capsule Take 1 capsule (250 mcg total) by mouth 2 (two) times daily. 60 capsule 5   No current facility-administered medications for this encounter.    Allergies  Allergen Reactions   Crestor [Rosuvastatin Calcium] Shortness Of Breath    Muscle cramps   Spironolactone Shortness Of Breath   Terazosin Shortness Of Breath, Palpitations and Other (See Comments)    Nerve pain   Amiodarone Other (See Comments)    Dropped pulse real low to 45-50, and made him feel very cold   Atenolol Other (See Comments)    Drowsiness and "flu sxs"   Bystolic [Nebivolol Hcl] Other (See Comments)    GI issues   Hydralazine Other (See Comments)    Joint swelling   Tizanidine Other (See Comments)    Syncope, Elevated BP/pulse   Amlodipine Other (See Comments)    unknown   Clonidine Other (See Comments)    unknown   Hydrocodone-Acetaminophen Other (See Comments)    unknown   Levofloxacin Other (See Comments)    unknown   Losartan Other (See Comments)    Did not work.  Pt states he couldn't take b/c of SE, but can't remember what SE were.   Maxzide [Hydrochlorothiazide W-Triamterene] Other (See Comments)    Does not tolerate potassium-sparing diuretics   Olmesartan Other (See Comments)    unknown   Tape Other (See Comments)    Other reaction(s): Unknown   Tussionex  Pennkinetic Er [Hydrocod Polst-Cpm Polst Er] Other (See Comments)    Other reaction(s): Unknown    Social History   Socioeconomic History   Marital status: Married    Spouse name: [name]   Number of children: 1   Years of education: 12   Highest education level: Not on file  Occupational History   Occupation: Retired  Tobacco Use   Smoking status: Never   Smokeless tobacco: Never  Vaping Use   Vaping Use: Never  used  Substance and Sexual Activity   Alcohol use: No   Drug use: No   Sexual activity: Not on file  Other Topics Concern   Not on file  Social History Narrative   Lives with wife   Right Handed   Drinks no caffeine   Social Determinants of Health   Financial Resource Strain: Low Risk    Difficulty of Paying Living Expenses: Not hard at all  Food Insecurity: No Food Insecurity   Worried About Charity fundraiser in the Last Year: Never true   Arboriculturist in the Last Year: Never true  Transportation Needs: No Transportation Needs   Lack of Transportation (Medical): No   Lack of Transportation (Non-Medical): No  Physical Activity: Inactive   Days of Exercise per Week: 0 days   Minutes of Exercise per Session: 0 min  Stress: No Stress Concern Present   Feeling of Stress : Not at all  Social Connections: Moderately Isolated   Frequency of Communication with Friends and Family: More than three times a week   Frequency of Social Gatherings with Friends and Family: More than three times a week   Attends Religious Services: Never   Marine scientist or Organizations: No   Attends Archivist Meetings: Never   Marital Status: Married  Human resources officer Violence: Not At Risk   Fear of Current or Ex-Partner: No   Emotionally Abused: No   Physically Abused: No   Sexually Abused: No    Family History  Problem Relation Age of Onset   Hypertension Father 49       Deceased   Heart disease Father    Lung cancer Mother 44       Deceased    Healthy Sister        x3   Healthy Brother        x4   Heart disease Brother        #5   Other Daughter        Alpha Thalassemia   Lupus Daughter        #2-deceased   Diabetes Neg Hx    Heart attack Neg Hx    Hyperlipidemia Neg Hx    Sudden death Neg Hx     ROS- All systems are reviewed and negative except as per the HPI above  Physical Exam: Vitals:   11/19/21 0946  BP: 138/64  Pulse: 80  Weight: 88.7 kg  Height: _0  (1.727 m)   Wt Readings from Last 3 Encounters:  11/19/21 88.7 kg  11/10/21 88 kg  11/03/21 88.9 kg    Labs: Lab Results  Component Value Date   NA 136 11/13/2021   K 4.3 11/13/2021   CL 103 11/13/2021   CO2 29 11/13/2021   GLUCOSE 92 11/13/2021   BUN 10 11/13/2021   CREATININE 1.25 (H) 11/13/2021   CALCIUM 8.9 11/13/2021   MG 2.2 11/13/2021   Lab Results  Component Value Date   INR 1.1 02/03/2021   Lab Results  Component Value Date   CHOL 154 04/13/2021   HDL 55 04/13/2021   [place] 83 04/13/2021   TRIG 87 04/13/2021     GEN- The patient is well appearing, alert and oriented x 3 today.   Head- normocephalic, atraumatic Eyes-  Sclera clear, conjunctiva pink Ears- hearing intact Oropharynx- clear Neck- supple, no JVP Lymph- no cervical lymphadenopathy Lungs- Clear to ausculation bilaterally, normal work of breathing Heart- Regular rate and rhythm,  no murmurs, rubs or gallops, PMI not laterally displaced GI- soft, NT, ND, + BS Extremities- no clubbing, cyanosis, or edema MS- no significant deformity or atrophy Skin- no rash or lesion Psych- euthymic mood, full affect Neuro- strength and sensation are intact  EKG- SR with PAC's , LAFB, rate 80 bpm, qrs int 140 ms, qtc 508 ns, corrected manually for LAFB to 487 ms     Assessment and Plan:  1. Persistent asymptomatic afib with RVR 1 week s/p Tikosyn admit  General education re Phyllis Ginger discussed again   He has had 2 short breakthrough episodes with RVR In SR today with  PAC's, qt manually corrected to acceptable range   Bmet/mag pending   2. CHA2DS2VASc  score of at least 5  Continue eliquis 5 mg bid   F/u one week for f/u EKG   Butch Penny C. Evella Kasal, [facility] [address] [place],  [id number] [phone]

## 2021-11-20 ENCOUNTER — Telehealth: Payer: Self-pay | Admitting: Cardiovascular Disease

## 2021-11-20 ENCOUNTER — Telehealth: Payer: Self-pay | Admitting: Cardiology

## 2021-11-20 DIAGNOSIS — M5136 Other intervertebral disc degeneration, lumbar region: Secondary | ICD-10-CM | POA: Diagnosis not present

## 2021-11-20 DIAGNOSIS — M47816 Spondylosis without myelopathy or radiculopathy, lumbar region: Secondary | ICD-10-CM | POA: Diagnosis not present

## 2021-11-20 MED ORDER — POTASSIUM CHLORIDE CRYS ER 20 MEQ PO TBCR: 40.0000 meq | EXTENDED_RELEASE_TABLET | Freq: Two times a day (BID) | ORAL | 3 refills | Status: DC

## 2021-11-20 NOTE — Telephone Encounter (Signed)
Reviewed patient medication list. Pt was taking 2 different doses of cardizem yesterday - he will remove 120mg  of diltiazem from his medication cabinet to avoid confusion. Pt describes leg pain as possible weakness more than likely related to extra dose of cardizem. Pt will call if issues arise. Follow up appt as scheduled next week.

## 2021-11-20 NOTE — Telephone Encounter (Signed)
See previous telephone encounter. Pt concerns addressed.

## 2021-11-20 NOTE — Telephone Encounter (Signed)
Patient called and said he was looking at his medication list from his hospital discharge, and the bottles he has at home do not match the list of medications from his hospital discharge. HE would like one of Dr. Macky Lower Nurse to call him to go over his medications

## 2021-11-20 NOTE — Telephone Encounter (Signed)
Pt c/o medication issue:  1. Name of Medication:  dofetilide (TIKOSYN) 250 MCG capsule  2. How are you currently taking this medication (dosage and times per day)?  Patient states he takes 1 capsule every 12 hours  3. Are you having a reaction (difficulty breathing--STAT)?  See below  4. What is your medication issue?   Patient states he has been experiencing leg tremors and tightness in his legs, mainly in the right leg. He states the tremors radiate all the way up to his gut and colon region. He states he was put on Tikosyn a few weeks ago and he assumes this medication may be causing this reaction. However, he states he takes quite a few medications so he is not certain. Please advise.

## 2021-11-20 NOTE — Telephone Encounter (Signed)
Tikosyn is prescribed by AF clinic.  Will route to that team.

## 2021-11-26 ENCOUNTER — Telehealth: Payer: Self-pay | Admitting: Pharmacist

## 2021-11-26 NOTE — Chronic Care Management (AMB) (Signed)
Chronic Care Management Pharmacy Assistant   Name: Patrick Roth  MRN: [id number] DOB: 1946-07-06   Reason for Encounter: Hypertension Adherence Call    Recent office visits:  11/19/2021 (A-fib Visit) Sherran Needs, NP; 1 week s/p Tikosyn  10/19/2021 [facility] (PCP) Allwardt, [name], PA-C; -His chronic constipation 2/2 opioids does seem to be causing him grief and we talked about Linzess as a possibility. As he is established with GI, I would like to run this by them and get back to the patient right away. I have sent a phone message out to his office today  09/18/2021 VV Jeanie Sewer, NP; generic Miralax to start bid, then qd.  09/17/2021 VV (PCP) Allwardt, [name], PA-C; Ultimately decided against anti-viral therapy at this time as he is unsure when his symptoms exactly started and he is doing fairly well overall  Recent consult visits:  11/20/2021 [facility] (Orthopaedic, Sports Medicine, Spine) Begovich, Geradine Girt, DO; no medication changes indicated.  11/03/2021 [facility] (Cardiology) Baldwin Jamaica, PA-C; no medication changes indicated.  10/05/2021 [facility] (Cardiology) Baldwin Jamaica, PA-C; I have given him lopressor 25mg  BID PRN for HR >120/symptoms Discussed NOT to use if HR is not [facility] visits:  10/28/2021 ED to Hospital Admission due to Atrial Fibrillation Admit date: 10/28/2021 Discharge date: 11/01/2021  STOP Amlodipine and Chorthalidone  START Diltiazem 160 mg q 6 hrs Potassium 20 meq twice daily "-We had planned to send him home on Metoprolol 25 mg twice daily but he has not received any metoprolol in several days (ordered as prn med). Therefore, he will not go home on scheduled Metoprolol tartrate.  He still has metoprolol at home to take for HR greater than 120."  10/27/2021 ED Visit for Near syncope -No medication changes indicated.  10/05/2021 ED Visit for Dizziness -Magnesium 400 mg twice daily -Potassium 20 meq two times daily  10/04/2021 ED Visit  for Atrial Flutter -Elective cardioversion in the emergency department -No medication changes indicated.  09/30/2021 ED Visit for Vasovagal Episode -No medication changes indicated.  Medications: Outpatient Encounter Medications as of 11/26/2021  Medication Sig   acetaminophen (TYLENOL) 650 MG CR tablet Take 1,300 mg by mouth every 8 (eight) hours as needed for pain.   amLODipine (NORVASC) 10 MG tablet Take 10 mg by mouth daily.   apixaban (ELIQUIS) 5 MG TABS tablet Take 1 tablet (5 mg total) by mouth 2 (two) times daily.   dicyclomine (BENTYL) 20 MG tablet Take 1 tablet (20 mg total) by mouth every 6 (six) hours as needed for spasms.   diltiazem (CARDIZEM CD) 180 MG 24 hr capsule Take 1 capsule (180 mg total) by mouth daily.   dofetilide (TIKOSYN) 250 MCG capsule Take 1 capsule (250 mcg total) by mouth 2 (two) times daily.   Evolocumab (REPATHA SURECLICK) 272 MG/ML SOAJ Inject 1 pen into the skin every 14 (fourteen) days.   famotidine (PEPCID) 20 MG tablet Take 1 tablet (20 mg total) by mouth daily.   Magnesium Oxide 400 MG CAPS Take 1 capsule (400 mg total) by mouth 2 (two) times daily.   metoprolol tartrate (LOPRESSOR) 25 MG tablet Take 1 tablet (25 mg total) by mouth 2 (two) times daily as needed (FOR HEARTRATE HIGHER THAN 120 bpm).   oxyCODONE-acetaminophen (PERCOCET/ROXICET) 5-325 MG tablet Take 1 tablet by mouth every 4 (four) hours as needed for severe pain.   polyethylene glycol powder (GLYCOLAX/MIRALAX) 17 GM/SCOOP powder Take 17 g by mouth in the morning and  at bedtime. Start with 1 scoop 2 times per day until bowels are moving, then reduce to 1 scoop daily. Drink 64oz of water daily.   potassium chloride SA (KLOR-CON M) 20 MEQ tablet Take 2 tablets (40 mEq total) by mouth 2 (two) times daily.   PRESCRIPTION MEDICATION Take 1 tablet by mouth daily. Stool softener - not sure about name - got samples from doctor's office.   No facility-administered encounter medications on file as of  11/26/2021.   Reviewed chart prior to disease state call. Spoke with patient regarding BP  Recent Office Vitals: BP Readings from Last 3 Encounters:  11/19/21 138/64  11/13/21 (!) 133/92  11/10/21 (!) 146/84   Pulse Readings from Last 3 Encounters:  11/19/21 80  11/13/21 (!) 57  11/10/21 (!) 102    Wt Readings from Last 3 Encounters:  11/19/21 195 lb 9.6 oz (88.7 kg)  11/10/21 194 lb (88 kg)  11/03/21 196 lb (88.9 kg)     Kidney Function Lab Results  Component Value Date/Time   CREATININE 1.14 11/19/2021 10:00 AM   CREATININE 1.25 (H) 11/13/2021 01:47 AM   CREATININE 1.27 (H) 05/15/2020 09:54 AM   CREATININE 1.37 (H) 02/13/2020 09:24 AM   CREATININE 1.20 (H) 10/25/2016 11:40 AM   CREATININE 1.36 (H) 09/14/2016 10:19 AM   GFR 58.48 (L) 10/27/2020 01:24 PM   GFRNONAA >60 11/19/2021 10:00 AM   GFRNONAA 55 (L) 05/15/2020 09:54 AM   GFRAA 58 (L) 01/07/2021 10:01 AM   GFRAA >60 05/15/2020 09:54 AM    BMP Latest Ref Rng & Units 11/19/2021 11/13/2021 11/12/2021  Glucose 70 - 99 mg/dL 130(H) 92 87  BUN 8 - 23 mg/dL 8 10 7(L)  Creatinine 0.61 - 1.24 mg/dL 1.14 1.25(H) 1.19  BUN/Creat Ratio 10 - 24 - - -  Sodium 135 - 145 mmol/L 136 136 135  Potassium 3.5 - 5.1 mmol/L 3.7 4.3 3.7  Chloride 98 - 111 mmol/L 101 103 102  CO2 22 - 32 mmol/L 28 29 25   Calcium 8.9 - 10.3 mg/dL 9.2 8.9 8.6(L)    Current antihypertensive regimen:  Amlodipine 10 mg daily Diltiazem 180 mg daily Metoprolol Tartrate 25 mg twice daily  How often are you checking your Blood Pressure? daily  Current home BP readings: 117/74  What recent interventions/DTPs have been made by any provider to improve Blood Pressure control since last CPP Visit: No recent blood pressure control changes made. Several changes to help with heart rate listed above in office visit notes.  Any recent hospitalizations or ED visits since last visit with CPP? Yes  What diet changes have been made to improve Blood Pressure  Control?  Patient states he eats a healthy diet.  What exercise is being done to improve your Blood Pressure Control?  Patient states due to current medical problems he is not able to exercise much. He tries his best to remain active.  Adherence Review: Is the patient currently on ACE/ARB medication? No Does the patient have >5 day gap between last estimated fill dates? No  Patient states his heart rate is still fluctuating due to problems with his "gut and colon". He states he is having a CT scan today. He also states he is having issues with his vagus nerve that is causing his heart rate to fluctuate.  Care Gaps: Medicare Annual Wellness: Completed 10/12/2021 Hemoglobin A1C: 5.8% on 01/17/2021 Colonoscopy: Discontinued, last completed 10/26/2010  Future Appointments  Date Time Provider [place]  11/27/2021 10:45 AM Fenton,  Clint R, PA [facility] None  12/11/2021  9:15 AM Constance Haw, MD [facility]  02/15/2022 11:00 AM Allwardt, [name], PA-C [facility]  02/18/2022  2:30 PM [facility] None  02/18/2022  3:00 PM Orson Slick, MD [facility] None  03/22/2022  3:45 PM [facility]CM PHARMACIST [facility]  08/12/2022  2:00 PM Garvin Fila, MD [facility] None  10/25/2022  1:45 PM [facility] HEALTH COACH [facility]   Star Rating Drugs: None  April D Calhoun, [place] Pharmacist Assistant [phone]

## 2021-11-27 ENCOUNTER — Other Ambulatory Visit: Payer: Self-pay

## 2021-11-27 ENCOUNTER — Ambulatory Visit (HOSPITAL_COMMUNITY)
Admission: RE | Admit: 2021-11-27 | Discharge: 2021-11-27 | Disposition: A | Discharge status: Home / Self Care | Payer: Medicare HMO | Source: Ambulatory Visit | Attending: Physician Assistant | Admitting: Physician Assistant

## 2021-11-27 ENCOUNTER — Telehealth (HOSPITAL_COMMUNITY): Payer: Self-pay | Admitting: Physician Assistant

## 2021-11-27 VITALS — HR 70

## 2021-11-27 DIAGNOSIS — I4819 Other persistent atrial fibrillation: Secondary | ICD-10-CM | POA: Insufficient documentation

## 2021-11-27 DIAGNOSIS — I4510 Unspecified right bundle-branch block: Secondary | ICD-10-CM | POA: Diagnosis not present

## 2021-11-27 DIAGNOSIS — I440 Atrioventricular block, first degree: Secondary | ICD-10-CM | POA: Diagnosis not present

## 2021-11-27 DIAGNOSIS — R1084 Generalized abdominal pain: Secondary | ICD-10-CM | POA: Diagnosis not present

## 2021-11-27 LAB — BASIC METABOLIC PANEL
Anion gap: 5 (ref 5–15)
BUN: 8 mg/dL (ref 8–23)
CO2: 29 mmol/L (ref 22–32)
Calcium: 9 mg/dL (ref 8.9–10.3)
Chloride: 100 mmol/L (ref 98–111)
Creatinine, Ser: 1.27 mg/dL — ABNORMAL HIGH (ref 0.61–1.24)
GFR, Estimated: 59 mL/min — ABNORMAL LOW (ref >60)
Glucose, Bld: 93 mg/dL (ref 70–99)
Potassium: 4.2 mmol/L (ref 3.5–5.1)
Sodium: 134 mmol/L — ABNORMAL LOW (ref 135–145)

## 2021-11-27 NOTE — Progress Notes (Signed)
Patient returns for ECG to follow up on QT. ECG shows SR HR 70, 1st degree AV block, RBBB, PR 210, QRS 138, QTc 483. QT acceptable on current dose of dofetilide. Follow up with Dr [name] as scheduled.

## 2021-11-27 NOTE — Telephone Encounter (Signed)
Called and left message for patient to call back because he missed his appt 11/27/21 for EKG on Tikosyn.

## 2021-12-04 IMAGING — MR MR PROSTATE WO/W CM
56 series · 56 of 56 positions shown · IV contrast (multihance)
Comparison: None

CLINICAL DATA: Elevated PSA. PSA level of 8.99 on 11/30/2019

EXAM:
MR PROSTATE WITHOUT AND WITH CONTRAST
TECHNIQUE: Multiplanar multisequence MRI images were obtained of the pelvis
centered about the prostate. Pre and post contrast images were
obtained.
CONTRAST:  20mL MULTIHANCE GADOBENATE DIMEGLUMINE 529 MG/ML IV SOLN

[Series 4: bSSFP fat-sat · axial · 8.0mm · 0.74mm/px · 1 of 28 slices shown]
[im 1/28]
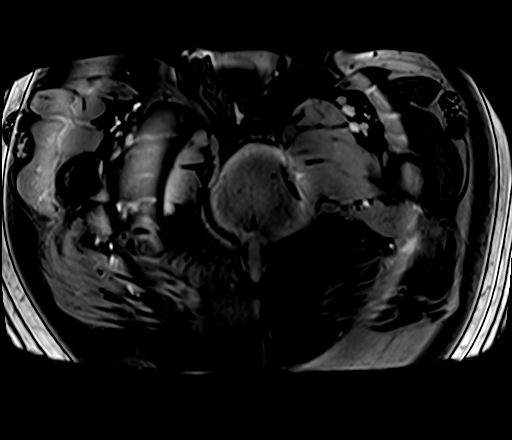

[Series 5: T1 · axial · 5.0mm · 1.25mm/px · 1 of 80 slices shown]
[im 1/80]
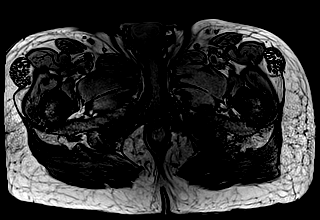

[Series 6: T2 · coronal · 3.5mm · 0.56mm/px · 1 of 23 slices shown (1 of 3)]
[im 1/23]
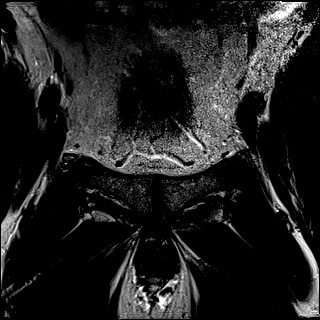

[Series 7: DWI · axial · 3.5mm · 1.75mm/px · 1 of 60 slices shown (1 of 3)]
[im 1/60]
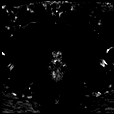

[Series 8: DWI · axial · 3.5mm · 1.75mm/px · 1 of 20 slices shown (2 of 3)]
[im 1/20]
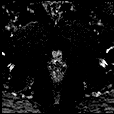

[Series 9: DWI · axial · 3.5mm · 1.56mm/px · 1 of 20 slices shown (3 of 3)]
[im 1/20]
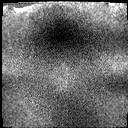

[Series 10: T2 · axial · 3.5mm · 0.56mm/px · 1 of 23 slices shown (2 of 3)]
[im 1/23]
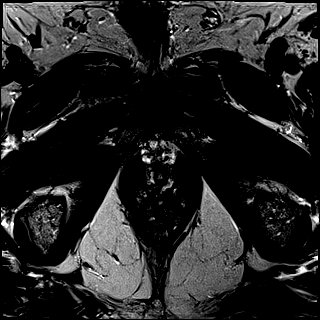

[Series 11: T2 · axial · 1.0mm · 1.04mm/px · 1 of 80 slices shown (3 of 3)]
[im 1/80]
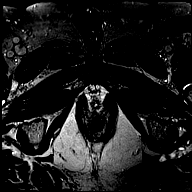

[Series 12: pre t1_twist_tra_dyn_ttc=5.3s · axial · non-contrast · 3.5mm · 0.83mm/px · 1 of 20 slices shown]
[im 1/20]
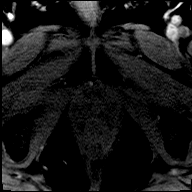

[Series 13: post t1_twist_tra_dyn-copy center · axial · 3.5mm · 0.83mm/px · 1 of 20 slices shown (1 of 24)]
[im 1/20]
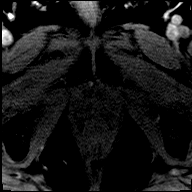

[Series 14: post t1_twist_tra_dyn-copy center · axial · 3.5mm · 0.83mm/px · 1 of 20 slices shown (2 of 24)]
[im 1/20]
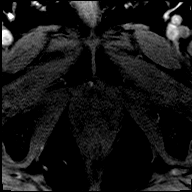

[Series 15: post t1_twist_tra_dyn-copy cent_sub_ttc=(id) · axial · 3.5mm · 0.83mm/px · 1 of 20 slices shown (1 of 23)]
[im 1/20]
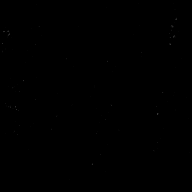

[Series 16: post t1_twist_tra_dyn-copy center · axial · 3.5mm · 0.83mm/px · 1 of 20 slices shown (3 of 24)]
[im 1/20]
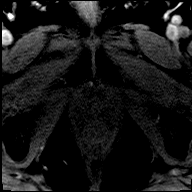

[Series 17: post t1_twist_tra_dyn-copy cent_sub_ttc=(id) · axial · 3.5mm · 0.83mm/px · 1 of 19 slices shown (2 of 23)]
[im 1/19]
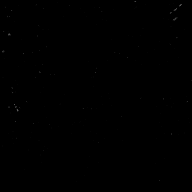

[Series 18: post t1_twist_tra_dyn-copy center · axial · 3.5mm · 0.83mm/px · 1 of 20 slices shown (4 of 24)]
[im 1/20]
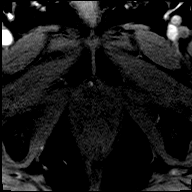

[Series 19: post t1_twist_tra_dyn-copy cent_sub_ttc=(id) · axial · 3.5mm · 0.83mm/px · 1 of 20 slices shown (3 of 23)]
[im 1/20]
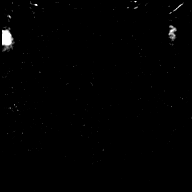

[Series 20: post t1_twist_tra_dyn-copy center · axial · 3.5mm · 0.83mm/px · 1 of 20 slices shown (5 of 24)]
[im 1/20]
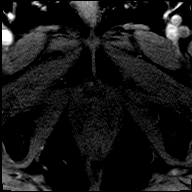

[Series 21: post t1_twist_tra_dyn-copy cent_sub_ttc=(id) · axial · 3.5mm · 0.83mm/px · 1 of 20 slices shown (4 of 23)]
[im 1/20]
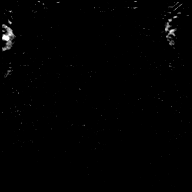

[Series 22: post t1_twist_tra_dyn-copy center · axial · 3.5mm · 0.83mm/px · 1 of 20 slices shown (6 of 24)]
[im 1/20]
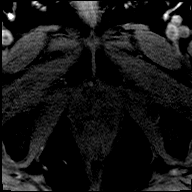

[Series 23: post t1_twist_tra_dyn-copy cent_sub_ttc=(id) · axial · 3.5mm · 0.83mm/px · 1 of 20 slices shown (5 of 23)]
[im 1/20]
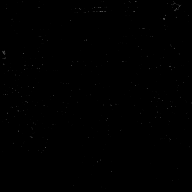

[Series 24: post t1_twist_tra_dyn-copy center · axial · 3.5mm · 0.83mm/px · 1 of 20 slices shown (7 of 24)]
[im 1/20]
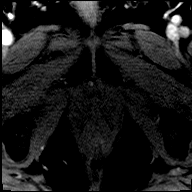

[Series 25: post t1_twist_tra_dyn-copy cent_sub_ttc=(id) · axial · 3.5mm · 0.83mm/px · 1 of 20 slices shown (6 of 23)]
[im 1/20]
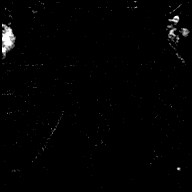

[Series 26: post t1_twist_tra_dyn-copy center · axial · 3.5mm · 0.83mm/px · 1 of 20 slices shown (8 of 24)]
[im 1/20]
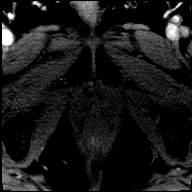

[Series 27: post t1_twist_tra_dyn-copy cent_sub_ttc=(id) · axial · 3.5mm · 0.83mm/px · 1 of 20 slices shown (7 of 23)]
[im 1/20]
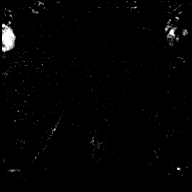

[Series 28: post t1_twist_tra_dyn-copy center · axial · 3.5mm · 0.83mm/px · 1 of 20 slices shown (9 of 24)]
[im 1/20]
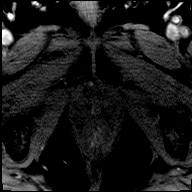

[Series 29: post t1_twist_tra_dyn-copy cent_sub_ttc=(id) · axial · 3.5mm · 0.83mm/px · 1 of 20 slices shown (8 of 23)]
[im 1/20]
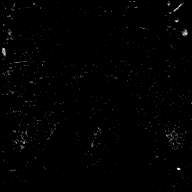

[Series 30: post t1_twist_tra_dyn-copy center · axial · 3.5mm · 0.83mm/px · 1 of 20 slices shown (10 of 24)]
[im 1/20]
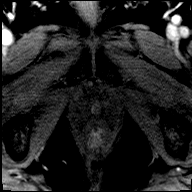

[Series 31: post t1_twist_tra_dyn-copy cent_sub_ttc=(id) · axial · 3.5mm · 0.83mm/px · 1 of 20 slices shown (9 of 23)]
[im 1/20]
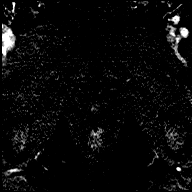

[Series 32: post t1_twist_tra_dyn-copy center · axial · 3.5mm · 0.83mm/px · 1 of 20 slices shown (11 of 24)]
[im 1/20]
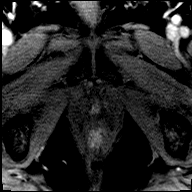

[Series 33: post t1_twist_tra_dyn-copy cent_sub_ttc=(id) · axial · 3.5mm · 0.83mm/px · 1 of 20 slices shown (10 of 23)]
[im 1/20]
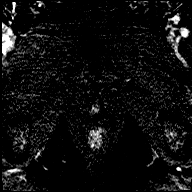

[Series 34: post t1_twist_tra_dyn-copy center · axial · 3.5mm · 0.83mm/px · 1 of 20 slices shown (12 of 24)]
[im 1/20]
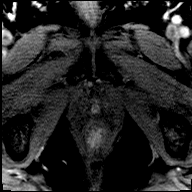

[Series 35: post t1_twist_tra_dyn-copy cent_sub_ttc=(id) · axial · 3.5mm · 0.83mm/px · 1 of 20 slices shown (11 of 23)]
[im 1/20]
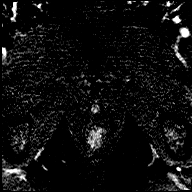

[Series 36: post t1_twist_tra_dyn-copy center · axial · 3.5mm · 0.83mm/px · 1 of 20 slices shown (13 of 24)]
[im 1/20]
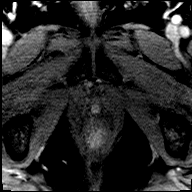

[Series 37: post t1_twist_tra_dyn-copy cent_sub_ttc=(id) · axial · 3.5mm · 0.83mm/px · 1 of 20 slices shown (12 of 23)]
[im 1/20]
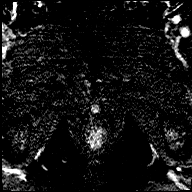

[Series 38: post t1_twist_tra_dyn-copy center · axial · 3.5mm · 0.83mm/px · 1 of 20 slices shown (14 of 24)]
[im 1/20]
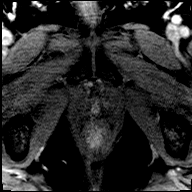

[Series 39: post t1_twist_tra_dyn-copy cent_sub_ttc=(id) · axial · 3.5mm · 0.83mm/px · 1 of 20 slices shown (13 of 23)]
[im 1/20]
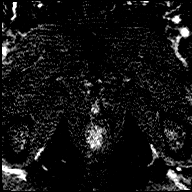

[Series 40: post t1_twist_tra_dyn-copy center · axial · 3.5mm · 0.83mm/px · 1 of 20 slices shown (15 of 24)]
[im 1/20]
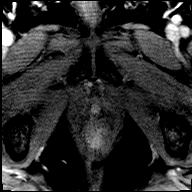

[Series 41: post t1_twist_tra_dyn-copy cent_sub_ttc=(id) · axial · 3.5mm · 0.83mm/px · 1 of 20 slices shown (14 of 23)]
[im 1/20]
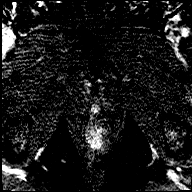

[Series 42: post t1_twist_tra_dyn-copy center · axial · 3.5mm · 0.83mm/px · 1 of 20 slices shown (16 of 24)]
[im 1/20]
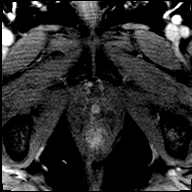

[Series 43: post t1_twist_tra_dyn-copy cent_sub_ttc=(id) · axial · 3.5mm · 0.83mm/px · 1 of 20 slices shown (15 of 23)]
[im 1/20]
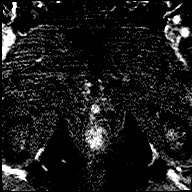

[Series 44: post t1_twist_tra_dyn-copy center · axial · 3.5mm · 0.83mm/px · 1 of 20 slices shown (17 of 24)]
[im 1/20]
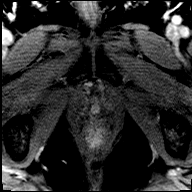

[Series 45: post t1_twist_tra_dyn-copy cent_sub_ttc=(id) · axial · 3.5mm · 0.83mm/px · 1 of 20 slices shown (16 of 23)]
[im 1/20]
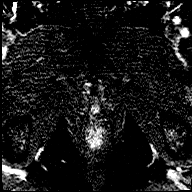

[Series 46: post t1_twist_tra_dyn-copy center · axial · 3.5mm · 0.83mm/px · 1 of 20 slices shown (18 of 24)]
[im 1/20]
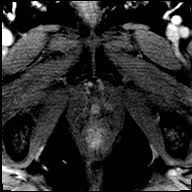

[Series 47: post t1_twist_tra_dyn-copy cent_sub_ttc=(id) · axial · 3.5mm · 0.83mm/px · 1 of 20 slices shown (17 of 23)]
[im 1/20]
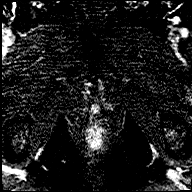

[Series 48: post t1_twist_tra_dyn-copy center · axial · 3.5mm · 0.83mm/px · 1 of 20 slices shown (19 of 24)]
[im 1/20]
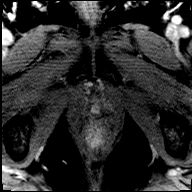

[Series 49: post t1_twist_tra_dyn-copy cent_sub_ttc=(id) · axial · 3.5mm · 0.83mm/px · 1 of 20 slices shown (18 of 23)]
[im 1/20]
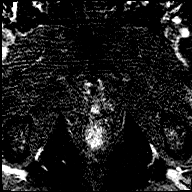

[Series 50: post t1_twist_tra_dyn-copy center · axial · 3.5mm · 0.83mm/px · 1 of 20 slices shown (20 of 24)]
[im 1/20]
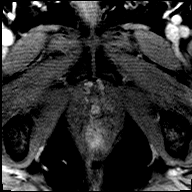

[Series 51: post t1_twist_tra_dyn-copy cent_sub_ttc=(id) · axial · 3.5mm · 0.83mm/px · 1 of 20 slices shown (19 of 23)]
[im 1/20]
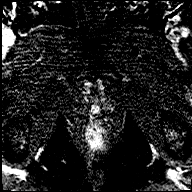

[Series 52: post t1_twist_tra_dyn-copy center · axial · 3.5mm · 0.83mm/px · 1 of 20 slices shown (21 of 24)]
[im 1/20]
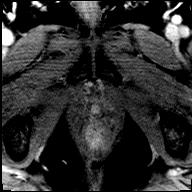

[Series 53: post t1_twist_tra_dyn-copy cent_sub_ttc=(id) · axial · 3.5mm · 0.83mm/px · 1 of 20 slices shown (20 of 23)]
[im 1/20]
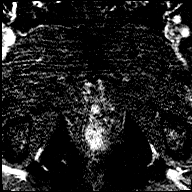

[Series 54: post t1_twist_tra_dyn-copy center · axial · 3.5mm · 0.83mm/px · 1 of 20 slices shown (22 of 24)]
[im 1/20]
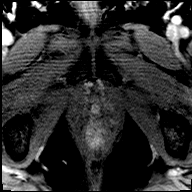

[Series 55: post t1_twist_tra_dyn-copy cent_sub_ttc=(id) · axial · 3.5mm · 0.83mm/px · 1 of 20 slices shown (21 of 23)]
[im 1/20]
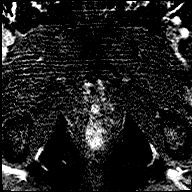

[Series 56: post t1_twist_tra_dyn-copy center · axial · 3.5mm · 0.83mm/px · 1 of 20 slices shown (23 of 24)]
[im 1/20]
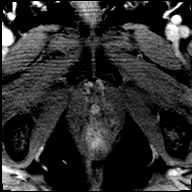

[Series 57: post t1_twist_tra_dyn-copy cent_sub_ttc=(id) · axial · 3.5mm · 0.83mm/px · 1 of 20 slices shown (22 of 23)]
[im 1/20]
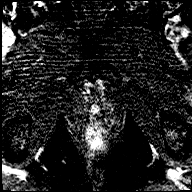

[Series 58: post t1_twist_tra_dyn-copy center · axial · 3.5mm · 0.83mm/px · 1 of 20 slices shown (24 of 24)]
[im 1/20]
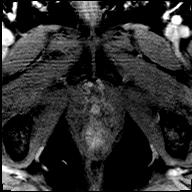

[Series 59: post t1_twist_tra_dyn-copy cent_sub_ttc=(id) · axial · 3.5mm · 0.83mm/px · 1 of 20 slices shown (23 of 23)]
[im 1/20]
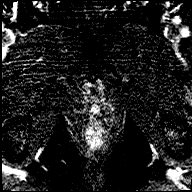

[56 of 56 positions shown; findings below may reference images not displayed]

FINDINGS: Prostate: Signs of BPH with thinning of the peripheral zone. No
signs of high-risk lesion. Signs of prostate utricle cyst. This
measures 9 mm.

Volume: 6.5 x 5.8 x 5.8 (volume = 110 cc) cm

Transcapsular spread:  Absent

Seminal vesicle involvement: Absent

Neurovascular bundle involvement: Absent

Pelvic adenopathy: Absent

Bone metastasis: Absent

Other findings: None
IMPRESSION: No signs of high-risk lesion with changes of BPH. PIRADS category 2
overall assessment.

## 2021-12-08 ENCOUNTER — Encounter (HOSPITAL_BASED_OUTPATIENT_CLINIC_OR_DEPARTMENT_OTHER): Payer: Self-pay | Admitting: Emergency Medicine

## 2021-12-08 ENCOUNTER — Telehealth: Payer: Self-pay

## 2021-12-08 ENCOUNTER — Other Ambulatory Visit: Payer: Self-pay

## 2021-12-08 ENCOUNTER — Ambulatory Visit: Payer: Medicare HMO | Admitting: Physician Assistant

## 2021-12-08 ENCOUNTER — Emergency Department (HOSPITAL_BASED_OUTPATIENT_CLINIC_OR_DEPARTMENT_OTHER)
Admission: EM | Admit: 2021-12-08 | Discharge: 2021-12-08 | Disposition: A | Discharge status: Home / Self Care | Payer: Medicare HMO | Attending: Emergency Medicine | Admitting: Emergency Medicine

## 2021-12-08 DIAGNOSIS — R209 Unspecified disturbances of skin sensation: Secondary | ICD-10-CM | POA: Diagnosis not present

## 2021-12-08 DIAGNOSIS — G8929 Other chronic pain: Secondary | ICD-10-CM | POA: Diagnosis not present

## 2021-12-08 DIAGNOSIS — Z79899 Other long term (current) drug therapy: Secondary | ICD-10-CM | POA: Diagnosis not present

## 2021-12-08 DIAGNOSIS — M5440 Lumbago with sciatica, unspecified side: Secondary | ICD-10-CM | POA: Diagnosis not present

## 2021-12-08 DIAGNOSIS — M5441 Lumbago with sciatica, right side: Secondary | ICD-10-CM | POA: Diagnosis not present

## 2021-12-08 DIAGNOSIS — Z7901 Long term (current) use of anticoagulants: Secondary | ICD-10-CM | POA: Insufficient documentation

## 2021-12-08 MED ORDER — KETOROLAC TROMETHAMINE 15 MG/ML IJ SOLN
15.0000 mg | Freq: Once | INTRAMUSCULAR | Status: AC
  Administered 2021-12-08: 15 mg via INTRAMUSCULAR
  Filled 2021-12-08: qty 1

## 2021-12-08 NOTE — ED Triage Notes (Signed)
Pt arrives to ED with c/o lower back pain. This is chronic with known hx of lumbar DDD with radiculopathy pain. Associated symptoms include chronic numbness/tingling in legs. Pt reports that the pain worsens when he has a BM. Pain is bilateral but more L>R.

## 2021-12-08 NOTE — Telephone Encounter (Signed)
Spoke with patient he is having back pain on right side,leg swelling and tightness.He is also experiencing shaking. Patient going to ED [facility].

## 2021-12-08 NOTE — Telephone Encounter (Signed)
Pt has an appt 12/08/21 @ 1:30pm  Patient Name: Patrick Roth Gender: Male DOB: 1946-10-12 Age: 76 Y 10 M 2 D Return Phone Number: [phone] (Primary), [phone] (Secondary) Address: City/ State/ Zip: [place]  [address] [facility] [name] [facility] [place] [facility] [place] [facility] [place] [facility] Contact Type Call Who Is Calling Patient / Member / Family / Caregiver Call Type Triage / Clinical Relationship To Patient Self Return Phone Number [phone] (Primary) Chief Complaint Leg Swelling And Edema Reason for Call Symptomatic / Request for [place] states he is having right leg and hip swelling and has had looser stool and leakage after bowel movement. Translation No Nurse Assessment Nurse: Adrian Blackwater, RN, Claiborne Billings Date/Time Eilene Ghazi Time): 12/08/2021 10:04:53 AM Confirm and document reason for call. If symptomatic, describe symptoms. ---Caller states he uses the bathroom in the morning and before he gets to the toilet he feels his leg swelling up, and he feels like his anus does not close up after a BM because he has stool leakage. Does the patient have any new or worsening symptoms? ---Yes Will a triage be completed? ---Yes Related visit to physician within the last 2 weeks? ---Yes Does the PT have any chronic conditions? (i.e. diabetes, asthma, this includes High risk factors for pregnancy, etc.) ---Yes List chronic conditions. ---Hx stroke, HTN, A fib, Is this a behavioral health or substance abuse call? ---No Guidelines Guideline Title Affirmed Question Affirmed Notes Nurse Date/Time (Eastern Time) Back Pain [1] Loss of bladder or bowel control (urine or bowel incontinence; wetting self, leaking stool) AND [2] new-onset Gigi Gin 12/08/2021 10:09:41 AM Disp. Time Eilene Ghazi Time) Disposition Final User 12/08/2021 10:11:00 AM Go to ED Now Yes Adrian Blackwater, RN, Maxie Barb  Disagree/Comply Disagree Caller Understands Yes PreDisposition Call Doctor Care Advice Given Per Guideline GO TO ED NOW: * You need to be seen in the Emergency Department. CARE ADVICE given per Back Pain (Adult) guideline. Comments User: Delana Meyer, RN Date/Time Eilene Ghazi Time): 12/08/2021 10:14:19 AM Report to Tammy at [facility], as pt refused ED. Referrals GO TO FACILITY REFUSED

## 2021-12-08 NOTE — ED Provider Notes (Signed)
[facility] EMERGENCY DEPT Provider Note   CSN: [id number] Arrival date & time: 12/08/21  1240     History  Chief Complaint  Patient presents with   Back Pain    Patrick Roth is a 76 y.o. male.   Back Pain  76 year old male with a history of low back pain presenting to the emergency department with acute on chronic low back pain.  The patient states that he was advised to present to the emergency department because he had leakage of stool from his anus.  He denies any actual loss of bowel or bladder function.  He denies any weakness in his legs bilaterally.  He denies any new falls or trauma to his lower lumbar spine.  He endorses a history of radicular pain but denies any radicular pain today.  He endorses worsening low back pain with no radiation.  He has no difficulty with ambulation currently.  He does endorse chronic numbness and tingling in his legs bilaterally which has been present for months.  No new changes.  Home Medications Prior to Admission medications   Medication Sig Start Date End Date Taking? Authorizing Provider  acetaminophen (TYLENOL) 650 MG CR tablet Take 1,300 mg by mouth every 8 (eight) hours as needed for pain.    [provider]  amLODipine (NORVASC) 10 MG tablet Take 10 mg by mouth daily.    [provider]  apixaban (ELIQUIS) 5 MG TABS tablet Take 1 tablet (5 mg total) by mouth 2 (two) times daily. 06/16/21   Camnitz, Ocie Doyne, MD  dicyclomine (BENTYL) 20 MG tablet Take 1 tablet (20 mg total) by mouth every 6 (six) hours as needed for spasms. 11/01/21   Richardson Dopp T, PA-C  diltiazem (CARDIZEM CD) 180 MG 24 hr capsule Take 1 capsule (180 mg total) by mouth daily. 11/14/21   Baldwin Jamaica, PA-C  dofetilide (TIKOSYN) 250 MCG capsule Take 1 capsule (250 mcg total) by mouth 2 (two) times daily. 11/19/21   Sherran Needs, NP  Evolocumab (REPATHA SURECLICK) 785 MG/ML SOAJ Inject 1 pen into the skin every 14 (fourteen)  days. 04/02/21   Camnitz, Ocie Doyne, MD  famotidine (PEPCID) 20 MG tablet Take 1 tablet (20 mg total) by mouth daily. 11/01/21   Richardson Dopp T, PA-C  Magnesium Oxide 400 MG CAPS Take 1 capsule (400 mg total) by mouth 2 (two) times daily. 88/50/27   Delora Fuel, MD  metoprolol tartrate (LOPRESSOR) 25 MG tablet Take 1 tablet (25 mg total) by mouth 2 (two) times daily as needed (FOR HEARTRATE HIGHER THAN 120 bpm). 10/05/21   Baldwin Jamaica, PA-C  oxyCODONE-acetaminophen (PERCOCET/ROXICET) 5-325 MG tablet Take 1 tablet by mouth every 4 (four) hours as needed for severe pain. 01/24/20   Malvin Johns, MD  polyethylene glycol powder (GLYCOLAX/MIRALAX) 17 GM/SCOOP powder Take 17 g by mouth in the morning and at bedtime. Start with 1 scoop 2 times per day until bowels are moving, then reduce to 1 scoop daily. Drink 64oz of water daily. 09/18/21   Jeanie Sewer, NP  potassium chloride SA (KLOR-CON M) 20 MEQ tablet Take 2 tablets (40 mEq total) by mouth 2 (two) times daily. 11/20/21   Sherran Needs, NP  PRESCRIPTION MEDICATION Take 1 tablet by mouth daily. Stool softener - not sure about name - got samples from doctor's office.    [provider]      Allergies    Crestor [rosuvastatin calcium], Spironolactone, Terazosin, Amiodarone, Atenolol, Bystolic [employer]  hcl], Hydralazine, Tizanidine, Amlodipine, Clonidine, Hydrocodone-acetaminophen, Levofloxacin, Losartan, Maxzide [hydrochlorothiazide w-triamterene], Olmesartan, Tape, and Tussionex pennkinetic er [hydrocod poli-chlorphe poli er]    Review of Systems   Review of Systems  Musculoskeletal:  Positive for back pain.  All other systems reviewed and are negative.  Physical Exam Updated Vital Signs BP (!) 130/97 (BP Location: Right Arm)    Pulse 77    Temp 97.8 F (36.6 C) (Oral)    Resp 15    Ht 5\' 8"  (1.727 m)    Wt 88.5 kg    SpO2 100%    BMI 29.65 kg/m  Physical Exam Vitals and nursing note reviewed.  Constitutional:       General: He is not in acute distress. HENT:     Head: Normocephalic and atraumatic.  Eyes:     Conjunctiva/sclera: Conjunctivae normal.     Pupils: Pupils are equal, round, and reactive to light.  Cardiovascular:     Rate and Rhythm: Normal rate and regular rhythm.  Pulmonary:     Effort: Pulmonary effort is normal. No respiratory distress.  Abdominal:     General: There is no distension.     Tenderness: There is no guarding.  Musculoskeletal:        General: No deformity or signs of injury.     Cervical back: Neck supple.     Comments: Mild paraspinal muscular tenderness to palpation of the lower lumbar spine, negative straight leg raise bilaterally  Skin:    Findings: No lesion or rash.  Neurological:     General: No focal deficit present.     Mental Status: He is alert. Mental status is at baseline.     Comments: MENTAL STATUS EXAM:    Orientation: Alert and oriented to person, place and time.  Memory: Cooperative, follows commands well.  Language: Speech is clear and language is normal.   CRANIAL NERVES:    CN 2 (Optic): Visual fields intact to confrontation.  CN 3,4,6 (EOM): Pupils equal and reactive to light. Full extraocular eye movement without nystagmus.  CN 5 (Trigeminal): Facial sensation is normal, no weakness of masticatory muscles.  CN 7 (Facial): No facial weakness or asymmetry.  CN 8 (Auditory): Auditory acuity grossly normal.  CN 9,10 (Glossophar): The uvula is midline, the palate elevates symmetrically.  CN 11 (spinal access): Normal sternocleidomastoid and trapezius strength.  CN 12 (Hypoglossal): The tongue is midline. No atrophy or fasciculations.Marland Kitchen   MOTOR:  Muscle Strength: 5/5RUE, 5/5LUE, 5/5RLE, 5/5LLE.   COORDINATION:   Intact finger-to-nose, no tremor, no pronator drift.   SENSATION:   Intact to light touch all four extremities.  GAIT: Gait normal without ataxia     ED Results / Procedures / Treatments   Labs (all labs ordered are listed, but  only abnormal results are displayed) Labs Reviewed - No data to display  EKG None  Radiology No results found.  Procedures Procedures    Medications Ordered in ED Medications  ketorolac (TORADOL) 15 MG/ML injection 15 mg (15 mg Intramuscular Given 12/08/21 1618)    ED Course/ Medical Decision Making/ A&P                           Medical Decision Making Risk Prescription drug management.   76 year old male with a history of low back pain presenting to the emergency department with acute on chronic low back pain.  The patient states that he was advised to present to the emergency department  because he had leakage of stool from his anus and felt like his anus wasn't closing up completely.  He denies any actual loss of bowel or bladder function.  He denies any weakness in his legs bilaterally.  He denies any new falls or trauma to his lower lumbar spine.  He endorses a history of radicular pain but denies any radicular pain today.  He endorses worsening low back pain with no radiation.  He has no difficulty with ambulation currently.  He does endorse chronic numbness and tingling in his legs bilaterally which has been present for months.  No new changes.  The patient is able to ambulate and is hemodynamically stable. There are no concerning red flag symptoms. Specifically, he denies: -Being on an anticoagulant or blood thinner -Having a history of AAA -Having saddle anesthesia -Having urinary incontinence -Having any recent falls or trauma  His episode of leakage of stool from his rectum does not sound like true fecal incontinence as he is able to stool on command and isn't rushing to the bathroom with incontinence. I have low suspicion for cauda equina at this time.  Differential Diagnoses: I do not think that [name] is experiencing cauda equina syndrome, abdominal aortic aneurysm, epidural abscess, aortic dissection, spinal hematoma, nephrolithiasis, spinal metastasis,  discitis, or an acute fracture.    While in the ED, I provided the patient with: Medications  ketorolac (TORADOL) 15 MG/ML injection 15 mg (15 mg Intramuscular Given 12/08/21 1618)    On reassessment, the patient appeared stable. This presentation is most consistent with acute on chronic low back pain with radicular symptoms.  Given the patient's reassuring presentation, I believe that he is safe for discharge.  I provided ED return precautions, specifically for the symptoms which are most concerning (e.g., saddle anesthesia, urinary or bowel incontinence or retention, changing or worsening pain), which would necessitate immediate return.  I encouraged the patient to followup with their PCP.  Final Clinical Impression(s) / ED Diagnoses Final diagnoses:  Chronic low back pain with sciatica, sciatica laterality unspecified, unspecified back pain laterality    Rx / DC Orders ED Discharge Orders     None         Regan Lemming, MD 12/10/21 1215

## 2021-12-08 NOTE — Telephone Encounter (Signed)
Access Nurse has called in stating patient was triaged for back pain and leakage of stole.  He was advised to go to ED and refused.  Waiting on Triage note.  Please follow up with patient in regard.

## 2021-12-08 NOTE — Discharge Instructions (Signed)
You were evaluated in the Emergency Department and after careful evaluation, we did not find any emergent condition requiring admission or further testing in the hospital.  Your exam/testing today was overall reassuring. Your exam was reassuring and points away from an emergent etiology of your back pain. No indication for advanced imaging or inpatient hospitalization at this time. Follow-up with your sports medicine physicians and PCP regarding further outpatient pain management. Continue with your outpatient bowel regimen.  Please return to the Emergency Department if you experience any worsening of your condition.  Thank you for allowing [place] to be a part of your care.

## 2021-12-08 NOTE — Telephone Encounter (Signed)
Noted and agreed, thank you. 

## 2021-12-10 ENCOUNTER — Emergency Department (HOSPITAL_COMMUNITY): Payer: Medicare HMO

## 2021-12-10 ENCOUNTER — Emergency Department (HOSPITAL_COMMUNITY)
Admission: EM | Admit: 2021-12-10 | Discharge: 2021-12-11 | Disposition: A | Discharge status: Home / Self Care | Payer: Medicare HMO | Attending: Emergency Medicine | Admitting: Emergency Medicine

## 2021-12-10 ENCOUNTER — Other Ambulatory Visit: Payer: Self-pay

## 2021-12-10 ENCOUNTER — Other Ambulatory Visit (HOSPITAL_COMMUNITY): Payer: Self-pay

## 2021-12-10 DIAGNOSIS — R079 Chest pain, unspecified: Secondary | ICD-10-CM | POA: Insufficient documentation

## 2021-12-10 DIAGNOSIS — Z7901 Long term (current) use of anticoagulants: Secondary | ICD-10-CM | POA: Insufficient documentation

## 2021-12-10 DIAGNOSIS — R0789 Other chest pain: Secondary | ICD-10-CM | POA: Diagnosis not present

## 2021-12-10 DIAGNOSIS — M549 Dorsalgia, unspecified: Secondary | ICD-10-CM | POA: Insufficient documentation

## 2021-12-10 DIAGNOSIS — R109 Unspecified abdominal pain: Secondary | ICD-10-CM | POA: Diagnosis not present

## 2021-12-10 DIAGNOSIS — I2510 Atherosclerotic heart disease of native coronary artery without angina pectoris: Secondary | ICD-10-CM | POA: Diagnosis not present

## 2021-12-10 DIAGNOSIS — R0602 Shortness of breath: Secondary | ICD-10-CM | POA: Insufficient documentation

## 2021-12-10 DIAGNOSIS — K429 Umbilical hernia without obstruction or gangrene: Secondary | ICD-10-CM | POA: Diagnosis not present

## 2021-12-10 DIAGNOSIS — I700 Atherosclerosis of aorta: Secondary | ICD-10-CM | POA: Diagnosis not present

## 2021-12-10 DIAGNOSIS — R002 Palpitations: Secondary | ICD-10-CM | POA: Insufficient documentation

## 2021-12-10 DIAGNOSIS — I517 Cardiomegaly: Secondary | ICD-10-CM | POA: Diagnosis not present

## 2021-12-10 DIAGNOSIS — R000 Tachycardia, unspecified: Secondary | ICD-10-CM | POA: Diagnosis not present

## 2021-12-10 DIAGNOSIS — N400 Enlarged prostate without lower urinary tract symptoms: Secondary | ICD-10-CM | POA: Diagnosis not present

## 2021-12-10 LAB — BASIC METABOLIC PANEL
Anion gap: 10 (ref 5–15)
BUN: 10 mg/dL (ref 8–23)
CO2: 25 mmol/L (ref 22–32)
Calcium: 9.2 mg/dL (ref 8.9–10.3)
Chloride: 104 mmol/L (ref 98–111)
Creatinine, Ser: 1.14 mg/dL (ref 0.61–1.24)
GFR, Estimated: >60 mL/min (ref >60)
Glucose, Bld: 123 mg/dL — ABNORMAL HIGH (ref 70–99)
Potassium: 3.8 mmol/L (ref 3.5–5.1)
Sodium: 139 mmol/L (ref 135–145)

## 2021-12-10 LAB — URINALYSIS, ROUTINE W REFLEX MICROSCOPIC
Bilirubin Urine: NEGATIVE
Glucose, UA: NEGATIVE mg/dL
Hgb urine dipstick: NEGATIVE
Ketones, ur: NEGATIVE mg/dL
Leukocytes,Ua: NEGATIVE
Nitrite: NEGATIVE
Protein, ur: NEGATIVE mg/dL
Specific Gravity, Urine: 1.01 (ref 1.005–1.030)
pH: 7 (ref 5.0–8.0)

## 2021-12-10 LAB — CBC
HCT: 50.8 % (ref 39.0–52.0)
Hemoglobin: 16 g/dL (ref 13.0–17.0)
MCH: 28.7 pg (ref 26.0–34.0)
MCHC: 31.5 g/dL (ref 30.0–36.0)
MCV: 91 fL (ref 80.0–100.0)
Platelets: 207 10*3/uL (ref 150–400)
RBC: 5.58 MIL/uL (ref 4.22–5.81)
RDW: 13.4 % (ref 11.5–15.5)
WBC: 5.3 10*3/uL (ref 4.0–10.5)
nRBC: 0 % (ref 0.0–0.2)

## 2021-12-10 LAB — TROPONIN I (HIGH SENSITIVITY)
Troponin I (High Sensitivity): 13 ng/L (ref <18)
Troponin I (High Sensitivity): 20 ng/L — ABNORMAL HIGH (ref <18)

## 2021-12-10 MED ORDER — IOHEXOL 350 MG/ML SOLN
100.0000 mL | Freq: Once | INTRAVENOUS | Status: AC | PRN
  Administered 2021-12-10: 100 mL via INTRAVENOUS

## 2021-12-10 NOTE — ED Provider Triage Note (Signed)
Emergency Medicine Provider Triage Evaluation Note  Patrick Roth , a 76 y.o. male  was evaluated in triage.  Pt complains of chest tightness and palpitations.  He has been having the palpitations off and on for 2 months, is followed by cardiology and is on Eliquis for A. fib and metoprolol for rate control.  No missed doses, feels short of breath.  Breathing makes the pain worse. .  Review of Systems  Positive: PALPITATIONS, CP Negative: SYNCOPE  Physical Exam  BP (!) 151/89 (BP Location: Right Arm)    Pulse (!) 107    Temp 97.8 F (36.6 C) (Oral)    Resp 20    Ht 5\' 8"  (1.727 m)    Wt 88.5 kg    SpO2 98%    BMI 29.65 kg/m  Gen:   Awake, no distress   Resp:  Normal effort  MSK:   Moves extremities without difficulty  Other:  2+ radial pulse bilaterally, irregular rhythm and tachycardic.  Medical Decision Making  Medically screening exam initiated at 8:11 PM.  Appropriate orders placed.  Patrick Roth was informed that the remainder of the evaluation will be completed by another provider, this initial triage assessment does not replace that evaluation, and the importance of remaining in the ED until their evaluation is complete.     Sherrill Raring, PA-C 12/10/21 2012

## 2021-12-10 NOTE — ED Provider Notes (Signed)
[facility] EMERGENCY DEPARTMENT Provider Note   CSN: [id number] Arrival date & time: 12/10/21  1940     History  Chief Complaint  Patient presents with   Palpitations    Patrick Roth is a 76 y.o. male.  The history is provided by the patient and medical records. No language interpreter was used.  Palpitations Palpitations quality:  Fast Onset quality:  Gradual Timing:  Constant Progression:  Resolved Chronicity:  Recurrent Relieved by:  Beta blockers Worsened by:  Nothing Ineffective treatments:  None tried Associated symptoms: back pain, chest pain and shortness of breath   Associated symptoms: no cough, no diaphoresis, no dizziness, no leg pain, no lower extremity edema, no malaise/fatigue, no nausea, no numbness and no vomiting   Risk factors: hx of atrial fibrillation       Home Medications Prior to Admission medications   Medication Sig Start Date End Date Taking? Authorizing Provider  acetaminophen (TYLENOL) 650 MG CR tablet Take 1,300 mg by mouth every 8 (eight) hours as needed for pain.    [provider]  amLODipine (NORVASC) 10 MG tablet Take 10 mg by mouth daily.    [provider]  apixaban (ELIQUIS) 5 MG TABS tablet Take 1 tablet (5 mg total) by mouth 2 (two) times daily. 06/16/21   Camnitz, Ocie Doyne, MD  dicyclomine (BENTYL) 20 MG tablet Take 1 tablet (20 mg total) by mouth every 6 (six) hours as needed for spasms. 11/01/21   Richardson Dopp T, PA-C  diltiazem (CARDIZEM CD) 180 MG 24 hr capsule Take 1 capsule (180 mg total) by mouth daily. 11/14/21   Baldwin Jamaica, PA-C  dofetilide (TIKOSYN) 250 MCG capsule Take 1 capsule (250 mcg total) by mouth 2 (two) times daily. 11/19/21   Sherran Needs, NP  Evolocumab (REPATHA SURECLICK) 941 MG/ML SOAJ Inject 1 pen into the skin every 14 (fourteen) days. 04/02/21   Camnitz, Ocie Doyne, MD  famotidine (PEPCID) 20 MG tablet Take 1 tablet (20 mg total) by mouth daily. 11/01/21    Richardson Dopp T, PA-C  Magnesium Oxide 400 MG CAPS Take 1 capsule (400 mg total) by mouth 2 (two) times daily. 74/08/14   Delora Fuel, MD  metoprolol tartrate (LOPRESSOR) 25 MG tablet Take 1 tablet (25 mg total) by mouth 2 (two) times daily as needed (FOR HEARTRATE HIGHER THAN 120 bpm). 10/05/21   Baldwin Jamaica, PA-C  oxyCODONE-acetaminophen (PERCOCET/ROXICET) 5-325 MG tablet Take 1 tablet by mouth every 4 (four) hours as needed for severe pain. 01/24/20   Malvin Johns, MD  polyethylene glycol powder (GLYCOLAX/MIRALAX) 17 GM/SCOOP powder Take 17 g by mouth in the morning and at bedtime. Start with 1 scoop 2 times per day until bowels are moving, then reduce to 1 scoop daily. Drink 64oz of water daily. 09/18/21   Jeanie Sewer, NP  potassium chloride SA (KLOR-CON M) 20 MEQ tablet Take 2 tablets (40 mEq total) by mouth 2 (two) times daily. 11/20/21   Sherran Needs, NP  PRESCRIPTION MEDICATION Take 1 tablet by mouth daily. Stool softener - not sure about name - got samples from doctor's office.    [provider]      Allergies    Crestor [rosuvastatin calcium], Spironolactone, Terazosin, Amiodarone, Atenolol, Bystolic [nebivolol hcl], Hydralazine, Tizanidine, Amlodipine, Clonidine, Hydrocodone-acetaminophen, Levofloxacin, Losartan, Maxzide [hydrochlorothiazide w-triamterene], Olmesartan, Tape, and Tussionex pennkinetic er [hydrocod poli-chlorphe poli er]    Review of Systems   Review of Systems  Constitutional:  Negative for  chills, diaphoresis, fatigue, fever and malaise/fatigue.  HENT:  Negative for congestion.   Respiratory:  Positive for shortness of breath. Negative for cough and chest tightness.   Cardiovascular:  Positive for chest pain and palpitations.  Gastrointestinal:  Positive for abdominal pain. Negative for constipation, diarrhea, nausea and vomiting.  Genitourinary:  Negative for dysuria and flank pain.  Musculoskeletal:  Positive for back pain. Negative for  neck pain and neck stiffness.  Neurological:  Negative for dizziness, light-headedness, numbness and headaches.  Psychiatric/Behavioral:  Negative for agitation.   All other systems reviewed and are negative.  Physical Exam Updated Vital Signs BP (!) 151/89 (BP Location: Right Arm)    Pulse (!) 107    Temp 97.8 F (36.6 C) (Oral)    Resp 20    Ht 5\' 8"  (1.727 m)    Wt 88.5 kg    SpO2 98%    BMI 29.65 kg/m  Physical Exam Vitals and nursing note reviewed.  Constitutional:      General: He is not in acute distress.    Appearance: He is well-developed. He is not ill-appearing, toxic-appearing or diaphoretic.  HENT:     Head: Normocephalic and atraumatic.     Nose: No congestion or rhinorrhea.     Mouth/Throat:     Mouth: Mucous membranes are moist.     Pharynx: No oropharyngeal exudate or posterior oropharyngeal erythema.  Eyes:     Conjunctiva/sclera: Conjunctivae normal.     Pupils: Pupils are equal, round, and reactive to light.  Cardiovascular:     Rate and Rhythm: Tachycardia present. Rhythm irregular.     Heart sounds: No murmur heard. Pulmonary:     Effort: Pulmonary effort is normal. No respiratory distress.     Breath sounds: Normal breath sounds. No wheezing, rhonchi or rales.  Chest:     Chest wall: No tenderness.  Abdominal:     General: Abdomen is flat.     Palpations: Abdomen is soft.     Tenderness: There is no abdominal tenderness. There is no right CVA tenderness, left CVA tenderness, guarding or rebound.  Musculoskeletal:        General: No swelling or tenderness.     Cervical back: Neck supple. No tenderness.  Skin:    General: Skin is warm and dry.     Capillary Refill: Capillary refill takes less than 2 seconds.     Findings: No erythema.  Neurological:     General: No focal deficit present.     Mental Status: He is alert.     Sensory: No sensory deficit.     Motor: No weakness.  Psychiatric:        Mood and Affect: Mood normal.    ED Results /  Procedures / Treatments   Labs (all labs ordered are listed, but only abnormal results are displayed) Labs Reviewed  BASIC METABOLIC PANEL - Abnormal; Notable for the following components:      Result Value   Glucose, Bld 123 (*)    All other components within normal limits  TROPONIN I (HIGH SENSITIVITY) - Abnormal; Notable for the following components:   Troponin I (High Sensitivity) 20 (*)    All other components within normal limits  URINE CULTURE  CBC  URINALYSIS, ROUTINE W REFLEX MICROSCOPIC  TROPONIN I (HIGH SENSITIVITY)    EKG EKG Interpretation  Date/Time:  Thursday December 10 2021 20:06:13 EST Ventricular Rate:  132 PR Interval:  296 QRS Duration: 136 QT Interval:  378 QTC Calculation:  560 R Axis:   -73 Text Interpretation: Sinus tachycardia with 1st degree A-V block with occasional Premature ventricular complexes Right bundle branch block Left anterior fascicular block ** Bifascicular block ** Cannot rule out Inferior infarct (masked by fascicular block?) , age undetermined Abnormal ECG When compared with ECG of 27-Nov-2021 12:39, PREVIOUS ECG IS PRESENT When compared to prior, faster rate. No STEMI Confirmed by Antony Blackbird [phone]) on 12/10/2021 9:53:25 PM  Radiology DG Chest 2 View  Result Date: 12/10/2021 CLINICAL DATA:  Chest pain and tachycardia. EXAM: CHEST - 2 VIEW COMPARISON:  10/28/2021 FINDINGS: The cardiac silhouette, mediastinal and hilar contours are within normal limits and stable. The lungs are clear. No pleural effusions. No pulmonary lesions. The bony thorax is intact. IMPRESSION: No acute cardiopulmonary findings. Electronically Signed   By: Marijo Sanes M.D.   On: 12/10/2021 21:01   CT Angio Chest/Abd/Pel for Dissection W and/or Wo Contrast  Result Date: 12/10/2021 CLINICAL DATA:  Chest and back pain. Evaluate for aortic dissection. EXAM: CT ANGIOGRAPHY CHEST, ABDOMEN AND PELVIS TECHNIQUE: Non-contrast CT of the chest was initially obtained.  Multidetector CT imaging through the chest, abdomen and pelvis was performed using the standard protocol during bolus administration of intravenous contrast. Multiplanar reconstructed images and MIPs were obtained and reviewed to evaluate the vascular anatomy. RADIATION DOSE REDUCTION: This exam was performed according to the departmental dose-optimization program which includes automated exposure control, adjustment of the mA and/or kV according to patient size and/or use of iterative reconstruction technique. CONTRAST:  159mL OMNIPAQUE IOHEXOL 350 MG/ML SOLN COMPARISON:  CT chest abdomen and pelvis 02/27/2020. FINDINGS: CTA CHEST FINDINGS Cardiovascular: Preferential opacification of the thoracic aorta. No evidence of thoracic aortic aneurysm or dissection. Heart is mildly enlarged. No pericardial effusion. Coronary artery calcifications are present. Mediastinum/Nodes: No enlarged mediastinal, hilar, or axillary lymph nodes. Thyroid gland, trachea, and esophagus demonstrate no significant findings. Lungs/Pleura: Lungs are clear. No pleural effusion or pneumothorax. Musculoskeletal: No chest wall abnormality. No acute or significant osseous findings. Review of the MIP images confirms the above findings. CTA ABDOMEN AND PELVIS FINDINGS VASCULAR Aorta: Normal caliber aorta without aneurysm, dissection, vasculitis or significant stenosis. There are atherosclerotic calcifications of the aorta. Celiac: Patent without evidence of aneurysm, dissection, vasculitis or significant stenosis. SMA: Patent without evidence of aneurysm, dissection, vasculitis or significant stenosis. Renals: Both renal arteries are patent without evidence of aneurysm, dissection, vasculitis, fibromuscular dysplasia or significant stenosis. IMA: Patent without evidence of aneurysm, dissection, vasculitis or significant stenosis. Inflow: Patent without evidence of aneurysm, dissection, vasculitis or significant stenosis. Veins: No obvious venous  abnormality within the limitations of this arterial phase study. Review of the MIP images confirms the above findings. NON-VASCULAR Hepatobiliary: No focal liver abnormality is seen. No gallstones, gallbladder wall thickening, or biliary dilatation. Pancreas: Unremarkable. No pancreatic ductal dilatation or surrounding inflammatory changes. Spleen: Normal in size without focal abnormality. Adrenals/Urinary Tract: Adrenal glands are unremarkable. Kidneys are normal, without renal calculi, focal lesion, or hydronephrosis. Bladder is unremarkable. Stomach/Bowel: Stomach is within normal limits. Appendix appears normal. No evidence of bowel wall thickening, distention, or inflammatory changes. Lymphatic: No enlarged lymph nodes are seen. Reproductive: Prostate gland is enlarged measuring 6.2 x 5.9 cm on axial imaging. Other: There is a small fat containing umbilical hernia. There is no free fluid or free air. Musculoskeletal: No acute or significant osseous findings. Review of the MIP images confirms the above findings. IMPRESSION: 1. No evidence for aortic dissection or aneurysm. 2. No acute localizing process in  the chest, abdomen or pelvis. 3. Mild cardiomegaly. 4. Stable prostatomegaly. 5.  Aortic Atherosclerosis (ICD10-I70.0). Electronically Signed   By: Ronney Asters M.D.   On: 12/10/2021 23:52    Procedures Procedures    Medications Ordered in ED Medications  iohexol (OMNIPAQUE) 350 MG/ML injection 100 mL (100 mLs Intravenous Contrast Given 12/10/21 2335)    ED Course/ Medical Decision Making/ A&P                           Medical Decision Making Amount and/or Complexity of Data Reviewed Labs: ordered. Radiology: ordered.  Risk Prescription drug management.   SALOME COZBY is a 76 y.o. male with a past medical history significant for hypertension, atrial fibrillation on Eliquis therapy, previous stroke, hyperlipidemia, chronic constipation, chronic back pain and chronic abdominal pain who  presents with tachycardia and elevated blood pressures in the setting of severe abdominal pain, chest pain, and back pain.  According to patient, he chronically has pains in his torso going to his back however it was worsened tonight when his heart rate and blood pressure were very elevated.  He reports his heart rate was in the 180s and his blood pressure was also in the 180s.  He took metoprolol and Tikosyn and since has had decrease in his heart rate.  He reports feeling the pain in his chest and abdomen go straight to his back with some tightness and pressure.  He reports of shortness of breath but is already started to feel better after the heart rate is slowed.  On my initial evaluation, blood pressure was in the 150s which is improved from the reported 180s at home.  He denies any recent fevers, chills, congestion, or cough.  Denies any urinary changes, constipation, or diarrhea.  He reports he has these pain symptoms frequently and is scheduled to his cardiologist tomorrow.  He was more concerned that his vital signs were very elevated in the setting of this with acutely worsened symptoms and when to make sure nothing else was wrong.  On my exam, lungs were clear and chest was nontender.  Abdomen was nontender.  Bowel sounds were appreciated.  Back and flanks nontender.  No focal neurologic deficits initially.  Good pulses in extremities.  Had a shared decision made conversation with patient about work-up.  I suspect this was more of an acute on chronic flare of his pain that likely causes vital signs and blood pressure to spike before taking his home medicines.  After his home medicines he is back to a more reasonable vital sign numbers however, we discussed that as previous imaging did show some calcifications on his aorta and he is having reported severe pain from his chest and abdomen to his back with a blood pressure 180, it is not reasonable to get a dissection study to rule out aortic disease.   He has not had any imaging of his torso in several years.  Patient agrees that if CT imaging is reassuring, he would feel comfortable going home now that his vital signs of improved and symptoms have improved.  He will follow-up with his cardiology team tomorrow and PCP subsequently thereafter.  Anticipate discharge if CT imaging is reassuring.  11:57 PM CT just returned reassuring.  Went through all the results with patient and he would like to go home and see his cardiologist tomorrow.  Patient's vital signs remained reassuring and his symptoms have resolved.  Patient understands return precautions  and follow-up instructions.  Patient discharged in good condition        Final Clinical Impression(s) / ED Diagnoses Final diagnoses:  Palpitations  Abdominal pain, unspecified abdominal location  Chest pain, unspecified type    Rx / DC Orders ED Discharge Orders     None       Clinical Impression: 1. Palpitations   2. Abdominal pain, unspecified abdominal location   3. Chest pain, unspecified type     Disposition: Discharge  Condition: Good  I have discussed the results, Dx and Tx plan with the pt(& family if present). He/she/they expressed understanding and agree(s) with the plan. Discharge instructions discussed at great length. Strict return precautions discussed and pt &/or family have verbalized understanding of the instructions. No further questions at time of discharge.    New Prescriptions   No medications on file    Follow Up: Allwardt, [name], PA-C [place] [address] [phone]     [facility] EMERGENCY DEPARTMENT [address] [id number] mc [place]       Stasha Naraine, Gwenyth Allegra, MD 12/11/21 [phone]

## 2021-12-10 NOTE — Discharge Instructions (Signed)
Your work-up today was overall reassuring.  Your heart enzymes were similar to what they have been in the past.  Your CT scan was reassuring and your vital signs with elevated blood pressure and fast heart rate both resolved after your medicines from home kicked in.  Please rest and stay hydrated and follow-up with your cardiologist tomorrow.  Please also follow-up with your PCP.  Your other work-up was reassuring and after discussion together we feel you are safe for discharge home.  Please return if any symptoms change or worsen and get some rest tonight

## 2021-12-10 NOTE — ED Triage Notes (Signed)
Pt c/o feeling like his heart is racing. HR at home 183. Took prescribed metoprolol 1 hour prior to arrival and tikosyn approx 20 min ago. HR in triage 102. C/o centralized chest tightness, pain radiates to back, some SOB, and feeling shaky. HX A fib

## 2021-12-11 ENCOUNTER — Ambulatory Visit: Payer: Medicare HMO | Admitting: Cardiology

## 2021-12-11 ENCOUNTER — Encounter: Payer: Self-pay | Admitting: Cardiology

## 2021-12-11 VITALS — BP 144/88 | HR 69 | Ht 68.0 in | Wt 197.8 lb

## 2021-12-11 DIAGNOSIS — I4819 Other persistent atrial fibrillation: Secondary | ICD-10-CM

## 2021-12-11 DIAGNOSIS — I10 Essential (primary) hypertension: Secondary | ICD-10-CM | POA: Diagnosis not present

## 2021-12-11 NOTE — Progress Notes (Signed)
Electrophysiology Office Note   Date:  12/11/2021   ID:  Patrick Roth, DOB September 11, 1946, MRN [id number]  PCP:  Patrick Lathe, PA-C  Cardiologist:  Patrick Roth Primary Electrophysiologist:  Patrick Roth Patrick Leeds, MD    No chief complaint on file.     History of Present Illness: Patrick Roth is a 76 y.o. male who presents today for electrophysiology evaluation.     He has a history significant for hypertension, hyperlipidemia, CVA, atrial fibrillation, atrial flutter.  He was admitted in 2017 with syncope felt due to orthostatic hypotension.  He was found to be in atrial fibrillation with rapid rates and was minimally symptomatic.  He was started on metoprolol and Xarelto.  He has previously been on amiodarone, but this was stopped.  He has now been loaded on dofetilide.  Today, denies symptoms of palpitations, chest pain, shortness of breath, orthopnea, PND, lower extremity edema, claudication, dizziness, presyncope, syncope, bleeding, or neurologic sequela. The patient is tolerating medications without difficulties.  Since being loaded on dofetilide, he has had minimal episodes of atrial fibrillation.  He is overall happy with his control.  His main complaint continues to be issues that he deems with his colon.  He has been following up with gastroenterology.  He feels that his heart rate goes fast when he has been having issues.  That being said, he does not feel that he is having major problems with his atrial fibrillation.  Past Medical History:  Diagnosis Date   Atrial fibrillation/flutter    Intol of amio, beta blocker // Echo 12/22: inf HK, EF 50-55, normal RVSF, mild LaE, mild MR, mod MAC, AV sclerosis w/o aS   Colon polyps    Constipation 05/27/2015   DOE (dyspnea on exertion) 10/13/2015   Faintness 06/20/2016   History of chicken pox    Hyperlipidemia    Hypertension    Internal hemorrhoids    Polycythemia    Sciatica    Stroke ([place])    Hx of stroke in2008; recurrent CVA  in 12/2020   Syncope and collapse 06/28/2016   Past Surgical History:  Procedure Laterality Date   CARDIOVERSION N/A 09/02/2017   Procedure: CARDIOVERSION;  Surgeon: [name], MD;  Location: [facility];  Service: Cardiovascular;  Laterality: N/A;   COLONOSCOPY  2011   great toe surgery     PROSTATE BIOPSY       Current Outpatient Medications  Medication Sig Dispense Refill   amLODipine (NORVASC) 10 MG tablet Take 10 mg by mouth daily.     apixaban (ELIQUIS) 5 MG TABS tablet Take 1 tablet (5 mg total) by mouth 2 (two) times daily. 180 tablet 1   chlorthalidone (HYGROTON) 25 MG tablet Take 25 mg by mouth daily.     dicyclomine (BENTYL) 20 MG tablet Take 1 tablet (20 mg total) by mouth every 6 (six) hours as needed for spasms. 30 tablet 0   diltiazem (CARDIZEM CD) 180 MG 24 hr capsule Take 1 capsule (180 mg total) by mouth daily. 30 capsule 5   dofetilide (TIKOSYN) 250 MCG capsule Take 1 capsule (250 mcg total) by mouth 2 (two) times daily. 60 capsule 5   Evolocumab (REPATHA SURECLICK) 818 MG/ML SOAJ Inject 1 pen into the skin every 14 (fourteen) days. (Patient taking differently: Inject 140 mg into the skin every 14 (fourteen) days.) 6 mL 3   famotidine (PEPCID) 20 MG tablet Take 1 tablet (20 mg total) by mouth daily. 30 tablet 0   Magnesium  Oxide 400 MG CAPS Take 1 capsule (400 mg total) by mouth 2 (two) times daily. 60 capsule 0   metoprolol tartrate (LOPRESSOR) 25 MG tablet Take 1 tablet (25 mg total) by mouth 2 (two) times daily as needed (FOR HEARTRATE HIGHER THAN 120 bpm). (Patient taking differently: Take 25 mg by mouth daily as needed (FOR HEARTRATE HIGHER THAN 120 bpm).) 90 tablet 0   oxyCODONE-acetaminophen (PERCOCET/ROXICET) 5-325 MG tablet Take 1 tablet by mouth every 4 (four) hours as needed for severe pain. 12 tablet 0   polyethylene glycol powder (GLYCOLAX/MIRALAX) 17 GM/SCOOP powder Take 17 g by mouth in the morning and at bedtime. Start with 1 scoop 2 times per day  until bowels are moving, then reduce to 1 scoop daily. Drink 64oz of water daily. (Patient taking differently: Take 17 g by mouth daily.) 3350 g 1   potassium chloride SA (KLOR-CON M) 20 MEQ tablet Take 2 tablets (40 mEq total) by mouth 2 (two) times daily. (Patient taking differently: Take 20 mEq by mouth 3 (three) times daily.) 120 tablet 3   No current facility-administered medications for this visit.    Allergies:   Crestor [rosuvastatin calcium], Spironolactone, Terazosin, Amiodarone, Atenolol, Bystolic [nebivolol hcl], Hydralazine, Tizanidine, Amlodipine, Clonidine, Hydrocodone-acetaminophen, Levofloxacin, Losartan, Maxzide [hydrochlorothiazide w-triamterene], Olmesartan, Tape, and Tussionex pennkinetic er [hydrocod poli-chlorphe poli er]   Social History:  The patient  reports that he has never smoked. He has never used smokeless tobacco. He reports that he does not drink alcohol and does not use drugs.   Family History:  The patient's family history includes Healthy in his brother and sister; Heart disease in his brother and father; Hypertension (age of onset: 29) in his father; Lung cancer (age of onset: 36) in his mother; Lupus in his daughter; Other in his daughter.   ROS:  Please see the history of present illness.   Otherwise, review of systems is positive for none.   All other systems are reviewed and negative.   PHYSICAL EXAM: VS:  BP (!) 144/88    Pulse 69    Ht _0  (1.727 m)    Wt 197 lb 12.8 oz (89.7 kg)    SpO2 97%    BMI 30.08 kg/m  , BMI Body mass index is 30.08 kg/m. GEN: Well nourished, well developed, in no acute distress  HEENT: normal  Neck: no JVD, carotid bruits, or masses Cardiac: RRR; no murmurs, rubs, or gallops,no edema  Respiratory:  clear to auscultation bilaterally, normal work of breathing GI: soft, nontender, nondistended, + BS MS: no deformity or atrophy  Skin: warm and dry Neuro:  Strength and sensation are intact Psych: euthymic mood, full  affect  EKG:  EKG is ordered today. Personal review of the ekg ordered shows sinus rhythm, right bundle branch block  Recent Labs: 10/04/2021: TSH 2.485 10/28/2021: ALT 21; B Natriuretic Peptide 116.2 11/19/2021: Magnesium 2.2 12/10/2021: BUN 10; Creatinine, Ser 1.14; Hemoglobin 16.0; Platelets 207; Potassium 3.8; Sodium 139    Lipid Panel     Component Value Date/Time   CHOL 154 04/13/2021 0933   TRIG 87 04/13/2021 0933   HDL 55 04/13/2021 0933   CHOLHDL 2.8 04/13/2021 0933   CHOLHDL 4.6 01/17/2021 0036   VLDL 11 01/17/2021 0036   LDLCALC 83 04/13/2021 0933     Wt Readings from Last 3 Encounters:  12/11/21 197 lb 12.8 oz (89.7 kg)  12/10/21 195 lb (88.5 kg)  12/08/21 195 lb (88.5 kg)      Other  studies Reviewed: Additional studies/ records that were reviewed today include: TTE 02/28/18 Review of the above records today demonstrates:  - Left ventricle: The cavity size was normal. Systolic function was   normal. The estimated ejection fraction was in the range of 55%   to 60%. Wall motion was normal; there were no regional wall   motion abnormalities. There was a reduced contribution of atrial   contraction to ventricular filling, due to increased ventricular   diastolic pressure or atrial contractile dysfunction. The study   is not technically sufficient to allow evaluation of LV diastolic   function. (suspect left atrial mechanical failure, possible   stunning after recent episode of atrial fibrillation versus   chronic atrial dysfunction) - Left atrium: The atrium was mildly dilated. - Right ventricle: The cavity size was mildly dilated. Wall   thickness was normal. - Right atrium: The atrium was mildly dilated. - Pulmonary arteries: Systolic pressure was moderately increased.   PA peak pressure: 60 mm Hg (S).  Myoview 02/24/18 Nuclear stress EF: 58%. Normal perfusion. No ischemia This is a low risk study.  ASSESSMENT AND PLAN:  1.  Persistent atrial  fibrillation/atrial flutter: Currently on Eliquis.  CHA2DS2-VASc of 4.  Admitted to the hospital for dofetilide load.  Currently on 250 mcg twice daily.  High risk medication monitoring via ECG.  He is in sinus rhythm today.  He has had a few episodes of atrial fibrillation but is overall happy with his control.  His main issue is his small bowels.  He has follow-up planned.  2.  Hypertension: Elevated today but usually well controlled.  No changes.   Current medicines are reviewed at length with the patient today.   The patient does not have concerns regarding his medicines.  The following changes were made today: None  Labs/ tests ordered today include:  Orders Placed This Encounter  Procedures   EKG 12-Lead      Disposition:   FU with Jantz Main 6 months  Signed, Shaunette Gassner Patrick Leeds, MD  12/11/2021 10:02 AM     [facility] [address] [place] [address] [phone] (office) [phone] (fax)

## 2021-12-11 NOTE — Patient Instructions (Signed)
Medication Instructions:  Your physician recommends that you continue on your current medications as directed. Please refer to the Current Medication list given to you today.  *If you need a refill on your cardiac medications before your next appointment, please call your pharmacy*   Lab Work: None ordered   Testing/Procedures: None ordered   Follow-Up: At [facility], you and your health needs are our priority.  As part of our continuing mission to provide you with exceptional heart care, we have created designated Provider Care Teams.  These Care Teams include your primary Cardiologist (physician) and Advanced Practice Providers (APPs -  Physician Assistants and Nurse Practitioners) who all work together to provide you with the care you need, when you need it.  Your next appointment:   6 month(s)  The format for your next appointment:   In Person  Provider:   Renee Ursuy, PA-C    Thank you for choosing [facility]!!   Kiyoshi Schaab, RN [phone]       

## 2021-12-12 LAB — URINE CULTURE: Culture: <10000 — AB

## 2021-12-14 ENCOUNTER — Other Ambulatory Visit (HOSPITAL_COMMUNITY): Payer: Self-pay

## 2021-12-14 ENCOUNTER — Other Ambulatory Visit: Payer: Self-pay | Admitting: Physician Assistant

## 2021-12-23 ENCOUNTER — Telehealth: Payer: Self-pay | Admitting: Cardiology

## 2021-12-23 ENCOUNTER — Ambulatory Visit (INDEPENDENT_AMBULATORY_CARE_PROVIDER_SITE_OTHER): Payer: Medicare HMO | Admitting: Physician Assistant

## 2021-12-23 ENCOUNTER — Other Ambulatory Visit: Payer: Self-pay

## 2021-12-23 ENCOUNTER — Encounter: Payer: Self-pay | Admitting: Physician Assistant

## 2021-12-23 VITALS — BP 157/83 | HR 70 | Temp 97.5°F | Ht 68.0 in | Wt 202.2 lb

## 2021-12-23 DIAGNOSIS — I10 Essential (primary) hypertension: Secondary | ICD-10-CM

## 2021-12-23 DIAGNOSIS — R600 Localized edema: Secondary | ICD-10-CM | POA: Diagnosis not present

## 2021-12-23 DIAGNOSIS — M5416 Radiculopathy, lumbar region: Secondary | ICD-10-CM

## 2021-12-23 NOTE — Telephone Encounter (Signed)
Patient calling back. He says he saw his PCP and they believe the swelling is from his amlodipine.

## 2021-12-23 NOTE — Telephone Encounter (Signed)
Returned call to patient. He reports he is experiencing ankle edema, right greater than left. Reports  "a little shortness of breath". Also experiencing coldness in his back/hip area and back of arms.  Pt recently seen in office, in [facility].    Not in afib currently that pt is aware of. Recent labs showing pt is NOT anemic. Discussed medications w/ pharmD   Pt advised to start w/ PCP first and to reach out to their office to discuss/evaluation. Advised to call back if needed to forward to Dr. [name] for review. Patient verbalized understanding and agreeable to plan.

## 2021-12-23 NOTE — Progress Notes (Signed)
Subjective:    Patient ID: Patrick Roth, male    DOB: 31-May-1946, 76 y.o.   MRN: [id number]  Chief Complaint  Patient presents with   Joint Swelling   Hip Pain   Foot Pain    HPI Patient is in today for bilateral lower leg swelling, R worse than L.  Having a lot of pain in lower back. Sits a lot. Says it causes him to have to use the bathroom.  Pain medicine physician in [facility] - Only takes Percocet through the night  Says has appt with Vagal nerve doctor on the 15th.   Denies any CP or SOB today. No rash or numbness. No weakness.   Past Medical History:  Diagnosis Date   Atrial fibrillation/flutter    Intol of amio, beta blocker // Echo 12/22: inf HK, EF 50-55, normal RVSF, mild LaE, mild MR, mod MAC, AV sclerosis w/o aS   Colon polyps    Constipation 05/27/2015   DOE (dyspnea on exertion) 10/13/2015   Faintness 06/20/2016   History of chicken pox    Hyperlipidemia    Hypertension    Internal hemorrhoids    Polycythemia    Sciatica    Stroke ([place])    Hx of stroke in2008; recurrent CVA in 12/2020   Syncope and collapse 06/28/2016    Past Surgical History:  Procedure Laterality Date   CARDIOVERSION N/A 09/02/2017   Procedure: CARDIOVERSION;  Surgeon: [name], MD;  Location: [facility];  Service: Cardiovascular;  Laterality: N/A;   COLONOSCOPY  2011   great toe surgery     PROSTATE BIOPSY      Family History  Problem Relation Age of Onset   Hypertension Father 35       Deceased   Heart disease Father    Lung cancer Mother 75       Deceased   Healthy Sister        x3   Healthy Brother        x4   Heart disease Brother        #5   Other Daughter        Alpha Thalassemia   Lupus Daughter        #2-deceased   Diabetes Neg Hx    Heart attack Neg Hx    Hyperlipidemia Neg Hx    Sudden death Neg Hx     Social History   Tobacco Use   Smoking status: Never   Smokeless tobacco: Never  Vaping Use   Vaping Use: Never used  Substance Use  Topics   Alcohol use: No   Drug use: No     Allergies  Allergen Reactions   Crestor [Rosuvastatin Calcium] Shortness Of Breath    Muscle cramps   Spironolactone Shortness Of Breath   Terazosin Shortness Of Breath, Palpitations and Other (See Comments)    Nerve pain   Amiodarone Other (See Comments)    Dropped pulse real low to 45-50, and made him feel very cold   Atenolol Other (See Comments)    Drowsiness and "flu sxs"   Bystolic [Nebivolol Hcl] Other (See Comments)    GI issues   Hydralazine Other (See Comments)    Joint swelling   Tizanidine Other (See Comments)    Syncope, Elevated BP/pulse   Amlodipine Other (See Comments)    unknown   Clonidine Other (See Comments)    unknown   Hydrocodone-Acetaminophen Other (See Comments)    unknown   Levofloxacin Other (  See Comments)    unknown   Losartan Other (See Comments)    Did not work.  Pt states he couldn't take b/c of SE, but can't remember what SE were.   Maxzide [Hydrochlorothiazide W-Triamterene] Other (See Comments)    Does not tolerate potassium-sparing diuretics   Olmesartan Other (See Comments)    unknown   Tape Other (See Comments)    Other reaction(s): Unknown   Tussionex Pennkinetic Er [Hydrocod Poli-Chlorphe Poli Er] Other (See Comments)    Other reaction(s): Unknown    Review of Systems NEGATIVE UNLESS OTHERWISE INDICATED IN HPI      Objective:     BP (!) 157/83    Pulse 70    Temp (!) 97.5 F (36.4 C)    Ht _0  (1.727 m)    Wt 202 lb 3.2 oz (91.7 kg)    SpO2 99%    BMI 30.74 kg/m   Wt Readings from Last 3 Encounters:  12/23/21 202 lb 3.2 oz (91.7 kg)  12/11/21 197 lb 12.8 oz (89.7 kg)  12/10/21 195 lb (88.5 kg)    BP Readings from Last 3 Encounters:  12/23/21 (!) 157/83  12/11/21 (!) 144/88  12/11/21 127/85     Physical Exam Vitals and nursing note reviewed.  Constitutional:      General: He is not in acute distress.    Appearance: Normal appearance. He is not toxic-appearing.   HENT:     Head: Normocephalic and atraumatic.     Right Ear: Ear canal and external ear normal.     Left Ear: Ear canal and external ear normal.     Nose: Nose normal.     Mouth/Throat:     Mouth: Mucous membranes are moist.     Pharynx: Oropharynx is clear.  Eyes:     Extraocular Movements: Extraocular movements intact.     Conjunctiva/sclera: Conjunctivae normal.     Pupils: Pupils are equal, round, and reactive to light.  Cardiovascular:     Rate and Rhythm: Normal rate and regular rhythm.     Pulses: Normal pulses.     Heart sounds: Normal heart sounds.  Pulmonary:     Effort: Pulmonary effort is normal.     Breath sounds: Normal breath sounds.  Abdominal:     General: Abdomen is flat. Bowel sounds are normal.     Palpations: Abdomen is soft.     Tenderness: There is no abdominal tenderness.  Musculoskeletal:        General: Normal range of motion.     Cervical back: Normal range of motion and neck supple.     Right lower leg: 4+ Pitting Edema present.     Left lower leg: 3+ Pitting Edema present.  Skin:    General: Skin is warm and dry.  Neurological:     General: No focal deficit present.     Mental Status: He is alert and oriented to person, place, and time.  Psychiatric:        Mood and Affect: Mood normal.        Behavior: Behavior normal.       Assessment & Plan:   Problem List Items Addressed This Visit       Cardiovascular and Mediastinum   Essential hypertension     Nervous and Auditory   Lumbar radiculopathy - Primary   Other Visit Diagnoses     Bilateral leg edema           1. Lumbar radiculopathy -He has  been seeing Dr. Abram Sander, encouraged him to cal to schedule another injection in his back  2. Bilateral leg edema 3. Essential hypertension -Most likely 2/2 amlodipine, but tolerable per patient -He will continue amlodipine for now. He will also try to have a pair of compression socks, limiting salt, elevating legs above hear level.      This note was prepared with assistance of Systems analyst. Occasional wrong-word or sound-a-like substitutions may have occurred due to the inherent limitations of voice recognition software.  Time Spent: 34 minutes of total time was spent on the date of the encounter performing the following actions: chart review prior to seeing the patient, obtaining history, performing a medically necessary exam, counseling on the treatment plan, placing orders, and documenting in our EHR.    Jaella Weinert M Kiaira Pointer, PA-C

## 2021-12-23 NOTE — Telephone Encounter (Signed)
Pt c/o swelling: STAT is pt has developed SOB within 24 hours  If swelling, where is the swelling located? Lower legs mainly ankles more so on the right side   How much weight have you gained and in what time span? Unsure, swelling started a week ago   Have you gained 3 pounds in a day or 5 pounds in a week? Doesn't know   Do you have a log of your daily weights (if so, list)? No   Are you currently taking a fluid pill? Doesn't think so   Are you currently SOB? No   Have you traveled recently? No    Thinks this may be due to one of the new meds prescribed at his last appointment. Has been getting worse daily.

## 2021-12-23 NOTE — Patient Instructions (Addendum)
I think you are ready for another injection of your back. Please call Dr. Morrell Riddle office [phone]  to schedule this.  Thank you for signing record release for [facility]. Please let me know how your next appointment goes as well.  I think the swelling in your legs is secondary to the amlodipine. I would encourage you to continue on this medication. Try compression socks. Limit salt in diet. Elevate legs above heart level as often as possible while sitting.  IF any chest pain or shortness of breath develops, please go to the nearest ER.

## 2021-12-24 ENCOUNTER — Other Ambulatory Visit: Payer: Self-pay | Admitting: Student

## 2021-12-24 ENCOUNTER — Telehealth: Payer: Self-pay | Admitting: Physician Assistant

## 2021-12-24 DIAGNOSIS — M5136 Other intervertebral disc degeneration, lumbar region: Secondary | ICD-10-CM | POA: Diagnosis not present

## 2021-12-24 DIAGNOSIS — M5416 Radiculopathy, lumbar region: Secondary | ICD-10-CM | POA: Diagnosis not present

## 2021-12-24 DIAGNOSIS — M47816 Spondylosis without myelopathy or radiculopathy, lumbar region: Secondary | ICD-10-CM | POA: Diagnosis not present

## 2021-12-24 NOTE — Telephone Encounter (Signed)
Pt walked in on 12/24/21 asking if Allwardt, Alyssa can send Leonard Downing from the [facility] a referral. She is the Medical oncology referral coordinator. Her phone is [phone] fax [phone].- lucia

## 2021-12-25 ENCOUNTER — Encounter (HOSPITAL_BASED_OUTPATIENT_CLINIC_OR_DEPARTMENT_OTHER): Payer: Self-pay

## 2021-12-25 ENCOUNTER — Emergency Department (HOSPITAL_BASED_OUTPATIENT_CLINIC_OR_DEPARTMENT_OTHER): Payer: Medicare HMO

## 2021-12-25 ENCOUNTER — Telehealth: Payer: Self-pay

## 2021-12-25 ENCOUNTER — Other Ambulatory Visit: Payer: Self-pay

## 2021-12-25 ENCOUNTER — Emergency Department (HOSPITAL_BASED_OUTPATIENT_CLINIC_OR_DEPARTMENT_OTHER)
Admission: EM | Admit: 2021-12-25 | Discharge: 2021-12-25 | Disposition: A | Discharge status: Home / Self Care | Payer: Medicare HMO | Attending: Emergency Medicine | Admitting: Emergency Medicine

## 2021-12-25 DIAGNOSIS — I10 Essential (primary) hypertension: Secondary | ICD-10-CM | POA: Diagnosis not present

## 2021-12-25 DIAGNOSIS — I700 Atherosclerosis of aorta: Secondary | ICD-10-CM | POA: Diagnosis not present

## 2021-12-25 DIAGNOSIS — Z79899 Other long term (current) drug therapy: Secondary | ICD-10-CM | POA: Diagnosis not present

## 2021-12-25 DIAGNOSIS — Z7901 Long term (current) use of anticoagulants: Secondary | ICD-10-CM | POA: Diagnosis not present

## 2021-12-25 DIAGNOSIS — R109 Unspecified abdominal pain: Secondary | ICD-10-CM | POA: Insufficient documentation

## 2021-12-25 DIAGNOSIS — M545 Low back pain, unspecified: Secondary | ICD-10-CM | POA: Diagnosis not present

## 2021-12-25 DIAGNOSIS — M549 Dorsalgia, unspecified: Secondary | ICD-10-CM | POA: Insufficient documentation

## 2021-12-25 LAB — COMPREHENSIVE METABOLIC PANEL
ALT: 12 U/L (ref 0–44)
AST: 17 U/L (ref 15–41)
Albumin: 4.2 g/dL (ref 3.5–5.0)
Alkaline Phosphatase: 74 U/L (ref 38–126)
Anion gap: 8 (ref 5–15)
BUN: 11 mg/dL (ref 8–23)
CO2: 29 mmol/L (ref 22–32)
Calcium: 9.7 mg/dL (ref 8.9–10.3)
Chloride: 102 mmol/L (ref 98–111)
Creatinine, Ser: 1.12 mg/dL (ref 0.61–1.24)
GFR, Estimated: >60 mL/min (ref >60)
Glucose, Bld: 97 mg/dL (ref 70–99)
Potassium: 3.8 mmol/L (ref 3.5–5.1)
Sodium: 139 mmol/L (ref 135–145)
Total Bilirubin: 1.5 mg/dL — ABNORMAL HIGH (ref 0.3–1.2)
Total Protein: 7.4 g/dL (ref 6.5–8.1)

## 2021-12-25 LAB — URINALYSIS, ROUTINE W REFLEX MICROSCOPIC
Bilirubin Urine: NEGATIVE
Glucose, UA: NEGATIVE mg/dL
Hgb urine dipstick: NEGATIVE
Ketones, ur: NEGATIVE mg/dL
Leukocytes,Ua: NEGATIVE
Nitrite: NEGATIVE
Protein, ur: NEGATIVE mg/dL
Specific Gravity, Urine: 1.019 (ref 1.005–1.030)
pH: 7 (ref 5.0–8.0)

## 2021-12-25 LAB — CBC
HCT: 48.3 % (ref 39.0–52.0)
Hemoglobin: 15.8 g/dL (ref 13.0–17.0)
MCH: 29.1 pg (ref 26.0–34.0)
MCHC: 32.7 g/dL (ref 30.0–36.0)
MCV: 89 fL (ref 80.0–100.0)
Platelets: 175 10*3/uL (ref 150–400)
RBC: 5.43 MIL/uL (ref 4.22–5.81)
RDW: 13.3 % (ref 11.5–15.5)
WBC: 6.1 10*3/uL (ref 4.0–10.5)
nRBC: 0 % (ref 0.0–0.2)

## 2021-12-25 LAB — LIPASE, BLOOD: Lipase: 16 U/L (ref 11–51)

## 2021-12-25 MED ORDER — IOHEXOL 300 MG/ML  SOLN
100.0000 mL | Freq: Once | INTRAMUSCULAR | Status: AC | PRN
  Administered 2021-12-25: 100 mL via INTRAVENOUS

## 2021-12-25 MED ORDER — DEXAMETHASONE SODIUM PHOSPHATE 4 MG/ML IJ SOLN
4.0000 mg | Freq: Once | INTRAMUSCULAR | Status: AC
  Administered 2021-12-25: 4 mg via INTRAVENOUS
  Filled 2021-12-25: qty 1

## 2021-12-25 MED ORDER — KETOROLAC TROMETHAMINE 30 MG/ML IJ SOLN
30.0000 mg | Freq: Once | INTRAMUSCULAR | Status: AC
  Administered 2021-12-25: 30 mg via INTRAVENOUS
  Filled 2021-12-25: qty 1

## 2021-12-25 NOTE — Telephone Encounter (Signed)
  Patient Name: Patrick Roth Gender: Male DOB: June 27, 1946 Age: 76 Y 10 M 19 D Return Phone Number: [phone] (Primary), [phone] (Secondary) Address: City/ State/ Zip: [place]  [address] [address] [place] [facility] [place] Paediatric nurse, Freight forwarder- PA Contact Type Call Who Is Calling Patient / Member / Family / Caregiver Call Type Triage / Clinical Relationship To Patient Self Return Phone Number [phone] (Primary) Chief Complaint Abdominal Pain Reason for Call Symptomatic / Request for Health Information Initial Comment Pt c/o abdominal pain, difficulty sleeping, shivering. Translation No Nurse Assessment Nurse: Redmond Pulling, RN, Levada Dy Date/Time (Eastern Time): 12/25/2021 8:26:51 AM Confirm and document reason for call. If symptomatic, describe symptoms. ---Patient c/o abdominal pain(bloated), difficulty sleeping, shivering. Symptoms started 2 days ago, but symptoms getting worse. Went to [place]- was referred to pain doctor and has an appointment on 01/06/2022 with pain medicine doctor. No constipation. No fever. Does the patient have any new or worsening symptoms? ---Yes Will a triage be completed? ---Yes Related visit to physician within the last 2 weeks? ---Yes Does the PT have any chronic conditions? (i.e. diabetes, asthma, this includes High risk factors for pregnancy, etc.) ---Yes List chronic conditions. ---HTN Is this a behavioral health or substance abuse call? ---No Guidelines Guideline Title Affirmed Question Affirmed Notes Nurse Date/Time Eilene Ghazi Time) Abdominal Pain - Male [1] SEVERE pain (e.g., excruciating) AND [2] present > 1 hour Redmond Pulling, RN, Levada Dy 12/25/2021 8:30:26 AM Disp. Time Eilene Ghazi Time) Disposition Final User  12/25/2021 8:33:11 AM Go to ED Now Yes Redmond Pulling, RN, Marin Shutter Disagree/Comply Comply Caller Understands Yes PreDisposition Go to Urgent  Care/Walk-In Clinic Care Advice Given Per Guideline GO TO ED NOW: * You need to be seen in the Emergency Department. * Leave now. Drive carefully. ANOTHER ADULT SHOULD DRIVE: * It is better and safer if another adult drives instead of you. NOTHING BY MOUTH: * Do not eat or drink anything for now. BRING MEDICINES: * Bring a list of your current medicines when you go to the Emergency Department (ER). * Bring the pill bottles too. This will help the doctor (or NP/PA) to make certain you are taking the right medicines and the right dose. CARE ADVICE given per Abdominal Pain, Male (Adult) guideline. Referrals [facility] - ED

## 2021-12-25 NOTE — ED Provider Notes (Signed)
[facility] EMERGENCY DEPT Provider Note   CSN: [id number] Arrival date & time: 12/25/21  1025     History  Chief Complaint  Patient presents with   Abdominal Pain    Patrick Roth is a 76 y.o. male w/ hx of a fib or flutter on eliquis, HTN, presenting to the ED with abdominal pain, which is triggered by his back pain. Patient reports he has been struggling with bowel movements for a few days.  He does have some discomfort which is largely suprapubic.  He denies dysuria, hematuria, history of UTI or kidney stone.  He denies any history of abdominal surgery.  He does have chronic back pain which radiates down his leg and also up the right side of his back towards the shoulder blade, says this is due to problems in his back and been going on for a long time; sees a nerve pain specialist for this.  HPI     Home Medications Prior to Admission medications   Medication Sig Start Date End Date Taking? Authorizing Provider  amLODipine (NORVASC) 10 MG tablet Take 10 mg by mouth daily.    [provider]  apixaban (ELIQUIS) 5 MG TABS tablet Take 1 tablet (5 mg total) by mouth 2 (two) times daily. 06/16/21   Camnitz, Ocie Doyne, MD  chlorthalidone (HYGROTON) 25 MG tablet Take 25 mg by mouth daily.    [provider]  dicyclomine (BENTYL) 20 MG tablet Take 1 tablet (20 mg total) by mouth every 6 (six) hours as needed for spasms. 11/01/21   Richardson Dopp T, PA-C  diltiazem (CARDIZEM CD) 180 MG 24 hr capsule Take 1 capsule (180 mg total) by mouth daily. 11/14/21   Baldwin Jamaica, PA-C  dofetilide (TIKOSYN) 250 MCG capsule Take 1 capsule (250 mcg total) by mouth 2 (two) times daily. 11/19/21   Sherran Needs, NP  Evolocumab (REPATHA SURECLICK) 017 MG/ML SOAJ Inject 1 pen into the skin every 14 (fourteen) days. Patient taking differently: Inject 140 mg into the skin every 14 (fourteen) days. 04/02/21   Camnitz, Ocie Doyne, MD  famotidine (PEPCID) 20 MG tablet Take  1 tablet (20 mg total) by mouth daily. 11/01/21   Richardson Dopp T, PA-C  Magnesium Oxide 400 MG CAPS Take 1 capsule (400 mg total) by mouth 2 (two) times daily. 49/44/96   Delora Fuel, MD  metoprolol tartrate (LOPRESSOR) 25 MG tablet Take 1 tablet (25 mg total) by mouth 2 (two) times daily as needed (FOR HEARTRATE HIGHER THAN 120 bpm). Patient taking differently: Take 25 mg by mouth daily as needed (FOR HEARTRATE HIGHER THAN 120 bpm). 10/05/21   Baldwin Jamaica, PA-C  oxyCODONE-acetaminophen (PERCOCET/ROXICET) 5-325 MG tablet Take 1 tablet by mouth every 4 (four) hours as needed for severe pain. 01/24/20   Malvin Johns, MD  polyethylene glycol powder (GLYCOLAX/MIRALAX) 17 GM/SCOOP powder Take 17 g by mouth in the morning and at bedtime. Start with 1 scoop 2 times per day until bowels are moving, then reduce to 1 scoop daily. Drink 64oz of water daily. Patient taking differently: Take 17 g by mouth daily. 09/18/21   Jeanie Sewer, NP  potassium chloride SA (KLOR-CON M) 20 MEQ tablet Take 2 tablets (40 mEq total) by mouth 2 (two) times daily. Patient taking differently: Take 20 mEq by mouth 2 (two) times daily. 11/20/21   Sherran Needs, NP      Allergies    Crestor [rosuvastatin calcium], Spironolactone, Terazosin, Amiodarone, Atenolol, Bystolic [nebivolol hcl],  Hydralazine, Tizanidine, Amlodipine, Clonidine, Hydrocodone-acetaminophen, Levofloxacin, Losartan, Maxzide [hydrochlorothiazide w-triamterene], Olmesartan, Tape, and Tussionex pennkinetic er [hydrocod poli-chlorphe poli er]    Review of Systems   Review of Systems  Physical Exam Updated Vital Signs BP 131/90 (BP Location: Right Arm)    Pulse 70    Temp 97.9 F (36.6 C) (Oral)    Resp 18    SpO2 100%  Physical Exam Constitutional:      General: He is not in acute distress. HENT:     Head: Normocephalic and atraumatic.  Eyes:     Conjunctiva/sclera: Conjunctivae normal.     Pupils: Pupils are equal, round, and reactive to  light.  Cardiovascular:     Rate and Rhythm: Normal rate and regular rhythm.  Pulmonary:     Effort: Pulmonary effort is normal. No respiratory distress.  Abdominal:     General: There is no distension.     Tenderness: There is no abdominal tenderness.  Skin:    General: Skin is warm and dry.  Neurological:     General: No focal deficit present.     Mental Status: He is alert. Mental status is at baseline.  Psychiatric:        Mood and Affect: Mood normal.        Behavior: Behavior normal.    ED Results / Procedures / Treatments   Labs (all labs ordered are listed, but only abnormal results are displayed) Labs Reviewed  COMPREHENSIVE METABOLIC PANEL - Abnormal; Notable for the following components:      Result Value   Total Bilirubin 1.5 (*)    All other components within normal limits  URINALYSIS, ROUTINE W REFLEX MICROSCOPIC - Abnormal; Notable for the following components:   Color, Urine COLORLESS (*)    All other components within normal limits  LIPASE, BLOOD  CBC    EKG None  Radiology CT ABDOMEN PELVIS W CONTRAST  Result Date: 12/25/2021 CLINICAL DATA:  Left lower quadrant abdominal pain. Evaluate for diverticulitis/colitis. Symptoms for several days. EXAM: CT ABDOMEN AND PELVIS WITH CONTRAST TECHNIQUE: Multidetector CT imaging of the abdomen and pelvis was performed using the standard protocol following bolus administration of intravenous contrast. RADIATION DOSE REDUCTION: This exam was performed according to the departmental dose-optimization program which includes automated exposure control, adjustment of the mA and/or kV according to patient size and/or use of iterative reconstruction technique. CONTRAST:  143mL OMNIPAQUE IOHEXOL 300 MG/ML  SOLN COMPARISON:  Abdominopelvic CT 12/10/2021 FINDINGS: Lower chest: Clear lung bases. No significant pleural or pericardial effusion. Hepatobiliary: The liver is normal in density without suspicious focal abnormality. No evidence  of gallstones, gallbladder wall thickening or biliary dilatation. Pancreas: Unremarkable. No pancreatic ductal dilatation or surrounding inflammatory changes. Spleen: Normal in size without focal abnormality. Adrenals/Urinary Tract: Both adrenal glands appear normal. Probable small cyst posteriorly in the interpolar region of the left kidney. No evidence of urinary tract calculus or hydronephrosis. The bladder appears unremarkable. Stomach/Bowel: No enteric contrast was administered. The stomach appears unremarkable for its degree of distension. No evidence of bowel wall thickening, distention or surrounding inflammatory change. The appendix appears normal. Vascular/Lymphatic: There are no enlarged abdominal or pelvic lymph nodes. Mild aortic and branch vessel atherosclerosis. No acute vascular findings. Reproductive: Marked enlargement of the prostate gland is again noted with heterogeneous enhancement. Other: No evidence of abdominal wall mass or hernia. No ascites. Musculoskeletal: No acute or significant osseous findings. Lower lumbar spondylosis and sacroiliac degenerative changes are noted bilaterally. IMPRESSION: 1. No acute findings  or explanation for the patient's symptoms. 2. Nonspecific heterogeneous enlargement of the prostate gland, similar to recent prior study. 3.  Aortic Atherosclerosis (ICD10-I70.0). Electronically Signed   By: Richardean Sale M.D.   On: 12/25/2021 12:38    Procedures Procedures    Medications Ordered in ED Medications  iohexol (OMNIPAQUE) 300 MG/ML solution 100 mL (100 mLs Intravenous Contrast Given 12/25/21 1221)  ketorolac (TORADOL) 30 MG/ML injection 30 mg (30 mg Intravenous Given 12/25/21 1409)  dexamethasone (DECADRON) injection 4 mg (4 mg Intravenous Given 12/25/21 1504)    ED Course/ Medical Decision Making/ A&P Clinical Course as of 12/25/21 1657  Fri Dec 25, 2021  1459 Patient has improvement of his pain after the Toradol.  On reassessment he does appear this is  consistent with his nerve pain.  He is already tried gabapentin in the past and has not worked for him.  He cannot take NSAIDs because he is on anticoagulation.  Would prefer to avoid opiates.  We can try a dose of Decadron which may provide him a little more relief, and he will follow-up with his nerve pain specialist this week. [MT]    Clinical Course User Index [MT] Oluwasemilore Bahl, Carola Rhine, MD                           Medical Decision Making Amount and/or Complexity of Data Reviewed Labs: ordered. Radiology: ordered.  Risk Prescription drug management.   This patient presents to the Emergency Department with complaint of abdominal pain. This involves an extensive number of treatment options, and is a complaint that carries with it a high risk of complications and morbidity.  The differential diagnosis includes, but is not limited to, gastritis vs biliary disease vs peptic ulcer vs constipation vs obstruction vs ileus vs colitis vs UTI vs referred thoracic/nerve pain vs other  I ordered, reviewed, and interpreted labs, showing no significant abnormalities Patient's UA showed  no signs of infection I ordered medication toradol for pain, with some relief.I ordered imaging studies which included CT abdomen pelvis I independently visualized and interpreted imaging which showed no acute inflammatory process to explain symptoms; and the monitor tracing which showed regular HR range 60-70's  After the interventions stated above, I reevaluated the patient and found that they remained clinically stable.  Based on the patient's clinical exam, vital signs, risk factors, and ED testing, I felt that the patient's overall risk of life-threatening emergency such as bowel perforation, surgical emergency, or sepsis was quite low.  I suspect this clinical presentation is most consistent with referred back/nerve pain, but explained to the patient that this evaluation was not a definitive diagnostic  workup.        Final Clinical Impression(s) / ED Diagnoses Final diagnoses:  Back pain, unspecified back location, unspecified back pain laterality, unspecified chronicity    Rx / DC Orders ED Discharge Orders     None         Wyvonnia Dusky, MD 12/25/21 [phone]

## 2021-12-25 NOTE — ED Triage Notes (Signed)
Pt presents with abd pain for several days, seen his PCP, instructed to increase his Oxycontin from 1 tablet to 2 tablets. Pt also c/o low back pain radiating up into his Right side, Right shoulder and in between shoulder blades. Pt reports back pain has been ongoing for several years, sees a pain specialists for his pain but he is not able to get in to see him again for another 3 weeks. PCP thinks he may be constipated from the increase of Oxycontin. Small BM this am. Last normal BM has "been a while"

## 2021-12-28 NOTE — Telephone Encounter (Signed)
Patient notified and verbalized understanding. 

## 2021-12-31 DIAGNOSIS — R1084 Generalized abdominal pain: Secondary | ICD-10-CM | POA: Diagnosis not present

## 2021-12-31 DIAGNOSIS — K5909 Other constipation: Secondary | ICD-10-CM | POA: Diagnosis not present

## 2021-12-31 DIAGNOSIS — G8929 Other chronic pain: Secondary | ICD-10-CM | POA: Diagnosis not present

## 2022-01-06 DIAGNOSIS — M5441 Lumbago with sciatica, right side: Secondary | ICD-10-CM | POA: Diagnosis not present

## 2022-01-06 DIAGNOSIS — I480 Paroxysmal atrial fibrillation: Secondary | ICD-10-CM | POA: Diagnosis not present

## 2022-01-06 DIAGNOSIS — I700 Atherosclerosis of aorta: Secondary | ICD-10-CM | POA: Diagnosis not present

## 2022-01-06 DIAGNOSIS — M47816 Spondylosis without myelopathy or radiculopathy, lumbar region: Secondary | ICD-10-CM | POA: Diagnosis not present

## 2022-01-06 DIAGNOSIS — R1084 Generalized abdominal pain: Secondary | ICD-10-CM | POA: Diagnosis not present

## 2022-01-06 DIAGNOSIS — I10 Essential (primary) hypertension: Secondary | ICD-10-CM | POA: Diagnosis not present

## 2022-01-06 DIAGNOSIS — N400 Enlarged prostate without lower urinary tract symptoms: Secondary | ICD-10-CM | POA: Diagnosis not present

## 2022-01-06 DIAGNOSIS — E782 Mixed hyperlipidemia: Secondary | ICD-10-CM | POA: Diagnosis not present

## 2022-01-06 DIAGNOSIS — Z8673 Personal history of transient ischemic attack (TIA), and cerebral infarction without residual deficits: Secondary | ICD-10-CM | POA: Diagnosis not present

## 2022-01-07 ENCOUNTER — Other Ambulatory Visit: Payer: Self-pay | Admitting: Cardiology

## 2022-01-12 DIAGNOSIS — M5416 Radiculopathy, lumbar region: Secondary | ICD-10-CM | POA: Diagnosis not present

## 2022-01-12 DIAGNOSIS — M47816 Spondylosis without myelopathy or radiculopathy, lumbar region: Secondary | ICD-10-CM | POA: Diagnosis not present

## 2022-01-12 DIAGNOSIS — I739 Peripheral vascular disease, unspecified: Secondary | ICD-10-CM | POA: Diagnosis not present

## 2022-01-12 DIAGNOSIS — M5136 Other intervertebral disc degeneration, lumbar region: Secondary | ICD-10-CM | POA: Diagnosis not present

## 2022-01-25 DIAGNOSIS — M2578 Osteophyte, vertebrae: Secondary | ICD-10-CM | POA: Diagnosis not present

## 2022-01-25 DIAGNOSIS — M4316 Spondylolisthesis, lumbar region: Secondary | ICD-10-CM | POA: Diagnosis not present

## 2022-01-25 DIAGNOSIS — I739 Peripheral vascular disease, unspecified: Secondary | ICD-10-CM | POA: Diagnosis not present

## 2022-01-25 DIAGNOSIS — M5416 Radiculopathy, lumbar region: Secondary | ICD-10-CM | POA: Diagnosis not present

## 2022-01-27 DIAGNOSIS — I10 Essential (primary) hypertension: Secondary | ICD-10-CM | POA: Diagnosis not present

## 2022-01-27 DIAGNOSIS — R152 Fecal urgency: Secondary | ICD-10-CM | POA: Diagnosis not present

## 2022-01-27 DIAGNOSIS — R1030 Lower abdominal pain, unspecified: Secondary | ICD-10-CM | POA: Diagnosis not present

## 2022-01-27 DIAGNOSIS — K6289 Other specified diseases of anus and rectum: Secondary | ICD-10-CM | POA: Diagnosis not present

## 2022-02-08 DIAGNOSIS — I10 Essential (primary) hypertension: Secondary | ICD-10-CM | POA: Diagnosis not present

## 2022-02-08 DIAGNOSIS — R1084 Generalized abdominal pain: Secondary | ICD-10-CM | POA: Diagnosis not present

## 2022-02-08 DIAGNOSIS — L853 Xerosis cutis: Secondary | ICD-10-CM | POA: Diagnosis not present

## 2022-02-10 ENCOUNTER — Telehealth: Payer: Self-pay

## 2022-02-10 NOTE — Telephone Encounter (Signed)
? ?  Pre-operative Risk Assessment  ?  ?Patient Name: Patrick Roth  ?DOB: 1946/03/20 ?MRN: [id number]  ? ?  ? ?Request for Surgical Clearance   ? ?Procedure:   Flex sigmoidoscopy ? ?Date of Surgery:  Clearance 03/15/22                              ?   ?Surgeon:  Dr. Shary Key ?Surgeon's Group or Practice Name:  [facility] ?Phone number:  [phone] ?Fax number:  [phone] ?  ?Type of Clearance Requested:   ?- Medical  ?- Pharmacy:  Hold Apixaban (Eliquis) x2 days ?  ?Type of Anesthesia:  Not Indicated ?  ?Additional requests/questions:     ? ?Signed, ?Thao Bauza   ?02/10/2022, 11:42 AM  ? ?

## 2022-02-10 NOTE — Telephone Encounter (Signed)
Patient with diagnosis of PAF on Eliquis for anticoagulation.   ? ?Procedure:  Flex sigmoidoscopy ?  ?Date of procedure: 03/15/22 ? ? ?CHA2DS2-VASc Score = 6  ?This indicates a 9.7% annual risk of stroke. ?The patient's score is based upon: ?CHF History: 0 ?HTN History: 1 ?Diabetes History: 0 ?Stroke History: 2 ?Vascular Disease History: 1 ?Age Score: 2 ?Gender Score: 0 ? ? ?CrCl 72 mL/min ?Platelet count 175K ? ?Per office protocol, patient can hold Eliquis for 1 day prior to procedure.   ?

## 2022-02-11 NOTE — Telephone Encounter (Signed)
? ?  Primary Cardiologist: Mertie Moores, MD ? ?Chart reviewed as part of pre-operative protocol coverage. Given past medical history and time since last visit, based on ACC/AHA guidelines, Patrick Roth would be at acceptable risk for the planned procedure without further cardiovascular testing.  ? ?Patient with diagnosis of PAF on Eliquis for anticoagulation.   ?  ?Procedure:  Flex sigmoidoscopy ?  ?Date of procedure: 03/15/22 ?  ?  ?CHA2DS2-VASc Score = 6  ?This indicates a 9.7% annual risk of stroke. ?The patient's score is based upon: ?CHF History: 0 ?HTN History: 1 ?Diabetes History: 0 ?Stroke History: 2 ?Vascular Disease History: 1 ?Age Score: 2 ?Gender Score: 0 ?  ?  ?CrCl 72 mL/min ?Platelet count 175K ?  ?Per office protocol, patient can hold Eliquis for 1 day prior to procedure. ? ?I will route this recommendation to the requesting party via Epic fax function and remove from pre-op pool. ? ?Please call with questions. ? ?Jossie Ng. Consuelo Thayne NP-C ? ?  ?02/11/2022, 10:10 AM ?[name] ?[name] ?Office [phone] Fax [phone] ? ? ? ? ?

## 2022-02-12 DIAGNOSIS — M5416 Radiculopathy, lumbar region: Secondary | ICD-10-CM | POA: Diagnosis not present

## 2022-02-12 DIAGNOSIS — M47816 Spondylosis without myelopathy or radiculopathy, lumbar region: Secondary | ICD-10-CM | POA: Diagnosis not present

## 2022-02-12 DIAGNOSIS — M5136 Other intervertebral disc degeneration, lumbar region: Secondary | ICD-10-CM | POA: Diagnosis not present

## 2022-02-15 ENCOUNTER — Encounter: Payer: Self-pay | Admitting: Physician Assistant

## 2022-02-15 ENCOUNTER — Ambulatory Visit (INDEPENDENT_AMBULATORY_CARE_PROVIDER_SITE_OTHER): Payer: Medicare HMO | Admitting: Physician Assistant

## 2022-02-15 ENCOUNTER — Telehealth: Payer: Self-pay | Admitting: Cardiology

## 2022-02-15 VITALS — BP 145/82 | HR 62 | Temp 97.2°F | Ht 68.0 in | Wt 207.0 lb

## 2022-02-15 DIAGNOSIS — M5416 Radiculopathy, lumbar region: Secondary | ICD-10-CM

## 2022-02-15 DIAGNOSIS — L679 Hair color and hair shaft abnormality, unspecified: Secondary | ICD-10-CM

## 2022-02-15 DIAGNOSIS — K594 Anal spasm: Secondary | ICD-10-CM

## 2022-02-15 DIAGNOSIS — I480 Paroxysmal atrial fibrillation: Secondary | ICD-10-CM

## 2022-02-15 DIAGNOSIS — I10 Essential (primary) hypertension: Secondary | ICD-10-CM | POA: Diagnosis not present

## 2022-02-15 DIAGNOSIS — R600 Localized edema: Secondary | ICD-10-CM | POA: Diagnosis not present

## 2022-02-15 DIAGNOSIS — L853 Xerosis cutis: Secondary | ICD-10-CM

## 2022-02-15 LAB — COMPREHENSIVE METABOLIC PANEL
ALT: 14 U/L (ref 0–53)
AST: 20 U/L (ref 0–37)
Albumin: 4.3 g/dL (ref 3.5–5.2)
Alkaline Phosphatase: 81 U/L (ref 39–117)
BUN: 16 mg/dL (ref 6–23)
CO2: 31 mEq/L (ref 19–32)
Calcium: 9.4 mg/dL (ref 8.4–10.5)
Chloride: 104 mEq/L (ref 96–112)
Creatinine, Ser: 1.06 mg/dL (ref 0.40–1.50)
GFR: 67.92 mL/min (ref >60.00)
Glucose, Bld: 96 mg/dL (ref 70–99)
Potassium: 3.9 mEq/L (ref 3.5–5.1)
Sodium: 139 mEq/L (ref 135–145)
Total Bilirubin: 1 mg/dL (ref 0.2–1.2)
Total Protein: 7 g/dL (ref 6.0–8.3)

## 2022-02-15 NOTE — Patient Instructions (Addendum)
Good to see you today! ?Please go to the lab for blood work and I will send results through [place]. ? ?Most likely swelling in lower legs is from the amlodipine. I would try compression socks and elevate legs at night above heart level to help lower this swelling level. ? ?Let me know how appointment goes with GI. ? ?Monitor BP at home. Limit salt in diet.  ? ?Went over increased bleeding risk again with taking Ibuprofen and Eliquis together.  ?

## 2022-02-15 NOTE — Progress Notes (Signed)
? ?Subjective:  ? ? Patient ID: Patrick Roth, male    DOB: [birth year], 76 y.o.   MRN: [id number] ? ?Chief Complaint  ?Patient presents with  ? Follow-up  ? ? ?HPI ?Patient is in today for f/up and medication recheck.  ? ?As long as he is taking Miralax, he does better. ?Following with GI to check nerve around rectum. ?Sigmoidoscopy on 03/15/22.  ? ?Went to pain medicine to see about injection in his back.  ?Gabapentin did not help nerve pain. Hasn't tried Lyrica. ?Taking Ibuprofen 200 mg every 6 hours and says this helps.  ? ?Still c/o swelling in bilateral lower extremities, no change as addressed last visit.  ? ?Dry skin; hair not growing as well along his beard line.  ? ?Sees Urologist annually by [facility]- says he went last year and everything was normal. No records to review.  ? ? ? ?Past Medical History:  ?Diagnosis Date  ? Atrial fibrillation/flutter   ? Intol of amio, beta blocker // Echo 12/22: inf HK, EF 50-55, normal RVSF, mild LaE, mild MR, mod MAC, AV sclerosis w/o aS  ? Colon polyps   ? Constipation 05/27/2015  ? DOE (dyspnea on exertion) 10/13/2015  ? Faintness 06/20/2016  ? History of chicken pox   ? Hyperlipidemia   ? Hypertension   ? Internal hemorrhoids   ? Polycythemia   ? Sciatica   ? Stroke [facility])   ? Hx of stroke in2008; recurrent CVA in 12/2020  ? Syncope and collapse 06/28/2016  ? ? ?Past Surgical History:  ?Procedure Laterality Date  ? CARDIOVERSION N/A 09/02/2017  ? Procedure: CARDIOVERSION;  Surgeon: [name], MD;  Location: [facility] ENDOSCOPY;  Service: Cardiovascular;  Laterality: N/A;  ? COLONOSCOPY  2011  ? great toe surgery    ? PROSTATE BIOPSY    ? ? ?Family History  ?Problem Relation Age of Onset  ? Hypertension Father 64  ?     Deceased  ? Heart disease Father   ? Lung cancer Mother 72  ?     Deceased  ? Healthy Sister   ?     x3  ? Healthy Brother   ?     x4  ? Heart disease Brother   ?     #5  ? Other Daughter   ?     Alpha Thalassemia  ? Lupus Daughter   ?      #2-deceased  ? Diabetes Neg Hx   ? Heart attack Neg Hx   ? Hyperlipidemia Neg Hx   ? Sudden death Neg Hx   ? ? ?Social History  ? ?Tobacco Use  ? Smoking status: Never  ? Smokeless tobacco: Never  ?Vaping Use  ? Vaping Use: Never used  ?Substance Use Topics  ? Alcohol use: No  ? Drug use: No  ?  ? ?Allergies  ?Allergen Reactions  ? Crestor [Rosuvastatin Calcium] Shortness Of Breath  ?  Muscle cramps  ? Spironolactone Shortness Of Breath  ? Terazosin Shortness Of Breath, Palpitations and Other (See Comments)  ?  Nerve pain  ? Amiodarone Other (See Comments)  ?  Dropped pulse real low to 45-50, and made him feel very cold  ? Atenolol Other (See Comments)  ?  Drowsiness and "flu sxs"  ? Bystolic [Nebivolol Hcl] Other (See Comments)  ?  GI issues  ? Hydralazine Other (See Comments)  ?  Joint swelling  ? Tizanidine Other (See Comments)  ?  Syncope, Elevated  BP/pulse  ? Amlodipine Other (See Comments)  ?  unknown  ? Clonidine Other (See Comments)  ?  unknown  ? Hydrocodone-Acetaminophen Other (See Comments)  ?  unknown  ? Levofloxacin Other (See Comments)  ?  unknown  ? Losartan Other (See Comments)  ?  Did not work.  Pt states he couldn't take b/c of SE, but can't remember what SE were.  ? Maxzide [Hydrochlorothiazide W-Triamterene] Other (See Comments)  ?  Does not tolerate potassium-sparing diuretics  ? Olmesartan Other (See Comments)  ?  unknown  ? Tape Other (See Comments)  ?  Other reaction(s): Unknown  ? Tussionex Pennkinetic Er [Hydrocod Poli-Chlorphe Poli Er] Other (See Comments)  ?  Other reaction(s): Unknown  ? ? ?Review of Systems ?NEGATIVE UNLESS OTHERWISE INDICATED IN HPI ? ? ?   ?Objective:  ?  ? ?BP (!) 145/82   Pulse 62   Temp (!) 97.2 ?F (36.2 ?C)   Ht _0  (1.727 m)   Wt 207 lb (93.9 kg)   SpO2 98%   BMI 31.47 kg/m?  ? ?Wt Readings from Last 3 Encounters:  ?02/15/22 207 lb (93.9 kg)  ?12/23/21 202 lb 3.2 oz (91.7 kg)  ?12/11/21 197 lb 12.8 oz (89.7 kg)  ? ? ?BP Readings from Last 3 Encounters:   ?02/15/22 (!) 145/82  ?12/25/21 131/90  ?12/23/21 (!) 157/83  ?  ? ?Physical Exam ?Vitals and nursing note reviewed.  ?Constitutional:   ?   General: He is not in acute distress. ?   Appearance: Normal appearance. He is not toxic-appearing.  ?HENT:  ?   Head: Normocephalic and atraumatic.  ?   Right Ear: Ear canal and external ear normal.  ?   Left Ear: Ear canal and external ear normal.  ?   Nose: Nose normal.  ?   Mouth/Throat:  ?   Mouth: Mucous membranes are moist.  ?   Pharynx: Oropharynx is clear.  ?Eyes:  ?   Extraocular Movements: Extraocular movements intact.  ?   Conjunctiva/sclera: Conjunctivae normal.  ?   Pupils: Pupils are equal, round, and reactive to light.  ?Cardiovascular:  ?   Rate and Rhythm: Normal rate and regular rhythm.  ?   Pulses: Normal pulses.  ?   Heart sounds: Normal heart sounds.  ?Pulmonary:  ?   Effort: Pulmonary effort is normal.  ?   Breath sounds: Normal breath sounds.  ?Abdominal:  ?   General: Abdomen is flat. Bowel sounds are normal.  ?   Palpations: Abdomen is soft.  ?   Tenderness: There is no abdominal tenderness.  ?Musculoskeletal:     ?   General: Normal range of motion.  ?   Cervical back: Normal range of motion and neck supple.  ?   Right lower leg: Edema present.  ?   Left lower leg: Edema present.  ?Skin: ?   General: Skin is warm and dry.  ?   Comments: Dry, flaking to skin of back and arms  ?Neurological:  ?   General: No focal deficit present.  ?   Mental Status: He is alert and oriented to person, place, and time.  ?Psychiatric:     ?   Mood and Affect: Mood normal.     ?   Behavior: Behavior normal.  ? ? ?   ?Assessment & Plan:  ? ?Problem List Items Addressed This Visit   ? ?  ? Cardiovascular and Mediastinum  ? Essential hypertension - Primary  ?  Relevant Orders  ? Comprehensive metabolic panel  ? TSH  ? T4, free  ? Paroxysmal atrial fibrillation (HCC)  ?  ? Nervous and Auditory  ? Lumbar radiculopathy  ?  ? Other  ? Rectal sphincter spasm (Chronic)  ? ?Other  Visit Diagnoses   ? ? Bilateral leg edema      ? Relevant Orders  ? Comprehensive metabolic panel  ? Dry skin      ? Relevant Orders  ? Comprehensive metabolic panel  ? TSH  ? T4, free  ? Hair changes      ? Relevant Orders  ? Comprehensive metabolic panel  ? TSH  ? T4, free  ? ?  ? ? ?1. Essential hypertension ?2. Bilateral leg edema ?Chronic somewhat elevated ?Currently taking amlodipine 10 mg daily ?Continues follow-up with cardiology ? ?3. Dry skin ?4. Hair changes ?I have not seen a thyroid checked on him.  Plan to check TSH and free T4 today to make sure this is not underlying etiology.  Most likely just needs to moisturize daily. ? ?5. Lumbar radiculopathy ?Recent visit with Dr. Arrie Eastern on 02/12/22:  ? ?1. Lumbar disc disease with spondylosis with foraminal stenosis at multiple levels and he does have a disc protrusion with impingement of the right S1 nerve. I explained to him that I do not believe that that is causing his bowel issues. We can look at doing a right S1 epidural but I would like to do that after he goes through his sigmoidoscopy and make sure were not dealing with another issue that could be causing some of the pain as it is related to his back in his bowel movements. He will continue with his current regimen of medicines. ? ?6. Rectal sphincter spasm ?He is scheduled for sigmoidoscopy next month.  MiraLAX seems to be reducing episodes. Encouraged continued use to keep stool regular. ? ?7. Paroxysmal atrial fibrillation (HCC) ?Continues to follow with cardiology.  Stable. ?Tikosyn 250 mg BID ?Eliquis 5 mg BID ? ?F/up in 6 months or prn with me  ? ? ? ?This note was prepared with assistance of Systems analyst. Occasional wrong-word or sound-a-like substitutions may have occurred due to the inherent limitations of voice recognition software. ? ? ?Renalda Locklin M Taraya Steward, PA-C ?

## 2022-02-15 NOTE — Telephone Encounter (Signed)
Pt c/o medication issue: ? ?1. Name of Medication:  ?apixaban (ELIQUIS) 5 MG TABS tablet  ? ?2. How are you currently taking this medication (dosage and times per day)?  ? ?3. Are you having a reaction (difficulty breathing--STAT)?  ? ?4. What is your medication issue?  ? ?Patient would like to know if it is alright to take Ibuprofen while on Eliquis. ? ?

## 2022-02-15 NOTE — Telephone Encounter (Signed)
Left message to call back  

## 2022-02-16 LAB — TSH: TSH: 1.89 u[IU]/mL (ref 0.35–5.50)

## 2022-02-16 LAB — T4, FREE: Free T4: 0.67 ng/dL (ref 0.60–1.60)

## 2022-02-17 ENCOUNTER — Telehealth: Payer: Self-pay | Admitting: Cardiology

## 2022-02-17 NOTE — Telephone Encounter (Signed)
Patient went to PCP, and PCP the amlodipine could be causing the swelling in his leg.  So he wants to check with Dr. [name].  ?

## 2022-02-17 NOTE — Telephone Encounter (Signed)
Left message to call back  

## 2022-02-18 ENCOUNTER — Other Ambulatory Visit: Payer: Medicare HMO

## 2022-02-18 ENCOUNTER — Telehealth: Payer: Self-pay | Admitting: Cardiology

## 2022-02-18 ENCOUNTER — Ambulatory Visit: Payer: Medicare HMO | Admitting: Hematology and Oncology

## 2022-02-18 NOTE — Telephone Encounter (Signed)
See today's, 3/30, telephone note for further documentation on this. ?

## 2022-02-18 NOTE — Telephone Encounter (Signed)
Pt reports has been noticing some swelling in his legs, left little more swollen than right. ?Mostly ankles he reports. ?Pt reports no issues with his AFib.  HRs controlled, no CP/dizziness/edema/sob. ? ?Advised pt he should follow up with PCP about ankle swelling and if possibly related to Amlodipine, even though he has been on this for years. ? ?Also reports that he is taking ibuprofen for back pain.  Taking it daily.  Advised to further discuss alternatives, being that he is on a blood thinner, and educated to rationale. Pt agreeable to speaking with pain clinic today about this. ? ?Pt will call back if needs anything further from cardiology. ?He appreciates my follow up ?

## 2022-02-18 NOTE — Telephone Encounter (Signed)
Pt c/o swelling: STAT is pt has developed SOB within 24 hours ? ?If swelling, where is the swelling located? Both legs are swollen, right more so  ? ?How much weight have you gained and in what time span? Hasn't gain any weight ? ?Have you gained 3 pounds in a day or 5 pounds in a week? Doesn't know  ? ?Do you have a log of your daily weights (if so, list)? no ? ?Are you currently taking a fluid pill? no ? ?Are you currently SOB? no ? ?Have you traveled recently? No ?  ?This didn't start until he was in the hospital and they put him on the ibuprofen.  ?He also wants to know should he continue to take ibuprofen since he is on a blood thinner.  ?

## 2022-02-19 ENCOUNTER — Other Ambulatory Visit (HOSPITAL_BASED_OUTPATIENT_CLINIC_OR_DEPARTMENT_OTHER): Payer: Self-pay

## 2022-02-19 ENCOUNTER — Other Ambulatory Visit: Payer: Self-pay | Admitting: Cardiology

## 2022-02-19 DIAGNOSIS — I4819 Other persistent atrial fibrillation: Secondary | ICD-10-CM

## 2022-02-19 NOTE — Telephone Encounter (Signed)
Eliquis '5mg'$  refill request received. Patient is 76 years old, weight-93.9kg, Crea-1.06 on 02/15/2022, Diagnosis-Afib, and last seen by Dr. [name] on 12/11/2021. Dose is appropriate based on dosing criteria. Will send in refill to requested pharmacy.   ?

## 2022-02-19 NOTE — Telephone Encounter (Signed)
See 3/30 telephone note on this ?

## 2022-03-03 ENCOUNTER — Emergency Department (HOSPITAL_COMMUNITY): Payer: Medicare HMO

## 2022-03-03 ENCOUNTER — Ambulatory Visit (INDEPENDENT_AMBULATORY_CARE_PROVIDER_SITE_OTHER): Payer: Medicare HMO | Admitting: Physician Assistant

## 2022-03-03 ENCOUNTER — Emergency Department (HOSPITAL_COMMUNITY)
Admission: EM | Admit: 2022-03-03 | Discharge: 2022-03-03 | Disposition: A | Discharge status: Home / Self Care | Payer: Medicare HMO | Attending: Emergency Medicine | Admitting: Emergency Medicine

## 2022-03-03 ENCOUNTER — Encounter (HOSPITAL_COMMUNITY): Payer: Self-pay | Admitting: *Deleted

## 2022-03-03 ENCOUNTER — Encounter: Payer: Self-pay | Admitting: Physician Assistant

## 2022-03-03 ENCOUNTER — Telehealth: Payer: Self-pay | Admitting: Cardiovascular Disease

## 2022-03-03 VITALS — BP 152/95 | HR 100 | Temp 98.2°F | Ht 68.0 in | Wt 205.6 lb

## 2022-03-03 DIAGNOSIS — I480 Paroxysmal atrial fibrillation: Secondary | ICD-10-CM

## 2022-03-03 DIAGNOSIS — Z79899 Other long term (current) drug therapy: Secondary | ICD-10-CM | POA: Insufficient documentation

## 2022-03-03 DIAGNOSIS — R079 Chest pain, unspecified: Secondary | ICD-10-CM | POA: Diagnosis not present

## 2022-03-03 DIAGNOSIS — I10 Essential (primary) hypertension: Secondary | ICD-10-CM | POA: Diagnosis not present

## 2022-03-03 DIAGNOSIS — Z7901 Long term (current) use of anticoagulants: Secondary | ICD-10-CM | POA: Diagnosis not present

## 2022-03-03 DIAGNOSIS — I4892 Unspecified atrial flutter: Secondary | ICD-10-CM | POA: Diagnosis not present

## 2022-03-03 DIAGNOSIS — I4891 Unspecified atrial fibrillation: Secondary | ICD-10-CM | POA: Diagnosis not present

## 2022-03-03 DIAGNOSIS — I484 Atypical atrial flutter: Secondary | ICD-10-CM | POA: Diagnosis not present

## 2022-03-03 DIAGNOSIS — R0789 Other chest pain: Secondary | ICD-10-CM | POA: Diagnosis not present

## 2022-03-03 DIAGNOSIS — R002 Palpitations: Secondary | ICD-10-CM | POA: Diagnosis present

## 2022-03-03 LAB — BASIC METABOLIC PANEL
Anion gap: 7 (ref 5–15)
BUN: 11 mg/dL (ref 8–23)
CO2: 27 mmol/L (ref 22–32)
Calcium: 9.3 mg/dL (ref 8.9–10.3)
Chloride: 103 mmol/L (ref 98–111)
Creatinine, Ser: 1.22 mg/dL (ref 0.61–1.24)
GFR, Estimated: >60 mL/min (ref >60)
Glucose, Bld: 103 mg/dL — ABNORMAL HIGH (ref 70–99)
Potassium: 4.2 mmol/L (ref 3.5–5.1)
Sodium: 137 mmol/L (ref 135–145)

## 2022-03-03 LAB — CBC
HCT: 49.2 % (ref 39.0–52.0)
Hemoglobin: 16.3 g/dL (ref 13.0–17.0)
MCH: 29.2 pg (ref 26.0–34.0)
MCHC: 33.1 g/dL (ref 30.0–36.0)
MCV: 88.2 fL (ref 80.0–100.0)
Platelets: 180 10*3/uL (ref 150–400)
RBC: 5.58 MIL/uL (ref 4.22–5.81)
RDW: 13 % (ref 11.5–15.5)
WBC: 4.8 10*3/uL (ref 4.0–10.5)
nRBC: 0 % (ref 0.0–0.2)

## 2022-03-03 LAB — TROPONIN I (HIGH SENSITIVITY)
Troponin I (High Sensitivity): 14 ng/L (ref <18)
Troponin I (High Sensitivity): 17 ng/L (ref <18)

## 2022-03-03 MED ORDER — METOPROLOL TARTRATE 5 MG/5ML IV SOLN
5.0000 mg | Freq: Once | INTRAVENOUS | Status: AC
  Administered 2022-03-03: 5 mg via INTRAVENOUS
  Filled 2022-03-03: qty 5

## 2022-03-03 NOTE — Progress Notes (Signed)
? ?Subjective:  ? ? Patient ID: Patrick Roth, male    DOB: [birth year], 76 y.o.   MRN: [id number] ? ?Chief Complaint  ?Patient presents with  ? Chest Pain  ?  Started 1 week ago. He states this has been keeping him up at night. He says he begins to feel nauseous and has to make a BM and it will go away. But he hasn't been able to relieve himself.   ? Abdominal Pain  ? Nausea  ?   ?  ? ? ?HPI ?Patient is in today for chest pain and constipation x 1 week. Drove himself to our office today.  ? ?Feeling jittery and cold. Denies chest pain, but feels tight in his chest 5/10 and it goes through to his back between shoulder blades. Feels like if he could use the bathroom symptoms would improve. ?Last BM about 2 days ago. Takes Miralax every day. Did not take it yet today. ? ?BP 180/99 this morning at home.  ? ?He did not call his cardiologist yet this morning. States that he took all of his regular medications this morning. Normally taking the Tikosyn helps symptoms, but last 2 days he has been feeling worse.  ? ?No N/V. Only slight SOB. No dizziness or headaches. No blurred vision. No weakness. Does not feel lightheaded or fainting like he used to during these episodes. ? ?Past Medical History:  ?Diagnosis Date  ? Atrial fibrillation/flutter   ? Intol of amio, beta blocker // Echo 12/22: inf HK, EF 50-55, normal RVSF, mild LaE, mild MR, mod MAC, AV sclerosis w/o aS  ? Colon polyps   ? Constipation 05/27/2015  ? DOE (dyspnea on exertion) 10/13/2015  ? Faintness 06/20/2016  ? History of chicken pox   ? Hyperlipidemia   ? Hypertension   ? Internal hemorrhoids   ? Polycythemia   ? Sciatica   ? Stroke [facility])   ? Hx of stroke in2008; recurrent CVA in 12/2020  ? Syncope and collapse 06/28/2016  ? ? ?Past Surgical History:  ?Procedure Laterality Date  ? CARDIOVERSION N/A 09/02/2017  ? Procedure: CARDIOVERSION;  Surgeon: [name], MD;  Location: [facility] ENDOSCOPY;  Service: Cardiovascular;  Laterality: N/A;  ? COLONOSCOPY  2011  ?  great toe surgery    ? PROSTATE BIOPSY    ? ? ?Family History  ?Problem Relation Age of Onset  ? Hypertension Father 29  ?     Deceased  ? Heart disease Father   ? Lung cancer Mother 93  ?     Deceased  ? Healthy Sister   ?     x3  ? Healthy Brother   ?     x4  ? Heart disease Brother   ?     #5  ? Other Daughter   ?     Alpha Thalassemia  ? Lupus Daughter   ?     #2-deceased  ? Diabetes Neg Hx   ? Heart attack Neg Hx   ? Hyperlipidemia Neg Hx   ? Sudden death Neg Hx   ? ? ?Social History  ? ?Tobacco Use  ? Smoking status: Never  ? Smokeless tobacco: Never  ?Vaping Use  ? Vaping Use: Never used  ?Substance Use Topics  ? Alcohol use: No  ? Drug use: No  ?  ? ?Allergies  ?Allergen Reactions  ? Crestor [Rosuvastatin Calcium] Shortness Of Breath  ?  Muscle cramps  ? Spironolactone Shortness Of Breath  ? Terazosin  Shortness Of Breath, Palpitations and Other (See Comments)  ?  Nerve pain  ? Amiodarone Other (See Comments)  ?  Dropped pulse real low to 45-50, and made him feel very cold  ? Atenolol Other (See Comments)  ?  Drowsiness and "flu sxs"  ? Bystolic [Nebivolol Hcl] Other (See Comments)  ?  GI issues  ? Hydralazine Other (See Comments)  ?  Joint swelling  ? Tizanidine Other (See Comments)  ?  Syncope, Elevated BP/pulse  ? Amlodipine Other (See Comments)  ?  unknown  ? Clonidine Other (See Comments)  ?  unknown  ? Hydrocodone-Acetaminophen Other (See Comments)  ?  unknown  ? Levofloxacin Other (See Comments)  ?  unknown  ? Losartan Other (See Comments)  ?  Did not work.  Pt states he couldn't take b/c of SE, but can't remember what SE were.  ? Maxzide [Hydrochlorothiazide W-Triamterene] Other (See Comments)  ?  Does not tolerate potassium-sparing diuretics  ? Olmesartan Other (See Comments)  ?  unknown  ? Tape Other (See Comments)  ?  Other reaction(s): Unknown  ? Tussionex Pennkinetic Er [Hydrocod Poli-Chlorphe Poli Er] Other (See Comments)  ?  Other reaction(s): Unknown  ? ? ?Review of Systems ?NEGATIVE UNLESS  OTHERWISE INDICATED IN HPI ? ? ?   ?Objective:  ?  ? ?BP (!) 152/95   Pulse 100   Temp 98.2 ?F (36.8 ?C) (Temporal)   Ht _0  (1.727 m)   Wt 205 lb 9.6 oz (93.3 kg)   SpO2 96%   BMI 31.26 kg/m?  ? ?Wt Readings from Last 3 Encounters:  ?03/03/22 205 lb 9.6 oz (93.3 kg)  ?02/15/22 207 lb (93.9 kg)  ?12/23/21 202 lb 3.2 oz (91.7 kg)  ? ? ?BP Readings from Last 3 Encounters:  ?03/03/22 (!) 152/95  ?02/15/22 (!) 145/82  ?12/25/21 131/90  ?  ? ?Physical Exam ?Vitals and nursing note reviewed.  ?Constitutional:   ?   General: He is not in acute distress. ?   Appearance: Normal appearance. He is not toxic-appearing.  ?HENT:  ?   Head: Normocephalic and atraumatic.  ?   Right Ear: Ear canal and external ear normal.  ?   Left Ear: Ear canal and external ear normal.  ?   Nose: Nose normal.  ?   Mouth/Throat:  ?   Mouth: Mucous membranes are moist.  ?   Pharynx: Oropharynx is clear.  ?Eyes:  ?   Extraocular Movements: Extraocular movements intact.  ?   Conjunctiva/sclera: Conjunctivae normal.  ?   Pupils: Pupils are equal, round, and reactive to light.  ?Cardiovascular:  ?   Rate and Rhythm: Normal rate. Rhythm irregular.  ?   Pulses: Normal pulses.  ?   Heart sounds: Normal heart sounds.  ?Pulmonary:  ?   Effort: Pulmonary effort is normal.  ?   Breath sounds: Normal breath sounds.  ?Abdominal:  ?   General: Abdomen is flat.  ?   Tenderness: There is no abdominal tenderness. There is no rebound.  ?Musculoskeletal:  ?   Cervical back: Normal range of motion and neck supple.  ?   Right lower leg: Edema present.  ?   Left lower leg: Edema present.  ?Skin: ?   General: Skin is warm and dry.  ?Neurological:  ?   General: No focal deficit present.  ?   Mental Status: He is alert and oriented to person, place, and time.  ?   Cranial Nerves: No cranial  nerve deficit.  ?   Motor: No weakness.  ?Psychiatric:     ?   Mood and Affect: Mood normal.     ?   Behavior: Behavior normal.  ? ? ?   ?Assessment & Plan:  ? ?Problem List  Items Addressed This Visit   ? ?  ? Cardiovascular and Mediastinum  ? Paroxysmal atrial fibrillation (HCC)  ? ?Other Visit Diagnoses   ? ? Chest pain, unspecified type    -  Primary  ? Relevant Orders  ? EKG 12-Lead (Completed)  ? Atypical atrial flutter ([place])      ? ?  ? ? ?1. Chest pain, unspecified type ?2. Atypical atrial flutter ([place]) ?3. Paroxysmal atrial fibrillation ([place]) ?76 yo male pt with hx of multiple hospitalizations secondary to a-fib with RVR, near syncope, palpitations (last admission 11/10/21), presents with 1 week of progressively worsening chest tightness, jittery feeling, slight SOB. I personally reviewed his ECG which showed RBBB, atrial-flutter-fib pattern with occasional PVC's, rate of 93 bpm. I then called his cardiologist, Dr. Acie Fredrickson, and he also reviewed today's history and EKG, advised patient go to [facility] ER for work-up. Did not feel that EMS was necessary today, but advised patient to have his wife drive him to the ED. Pt chose to drive himself home and will have his wife take him from there. An After Visit Summary was printed and given to the patient. ? ? ? ?This note was prepared with assistance of Systems analyst. Occasional wrong-word or sound-a-like substitutions may have occurred due to the inherent limitations of voice recognition software. ? ?In addition to time spent for ekg, I spent 45 minutes of total time on the date of the encounter performing the following actions: chart review prior to seeing the patient, obtaining history, performing a medically necessary exam, counseling on the treatment plan, placing orders, and documenting in our EHR.   ? ? ?Ritvik Mczeal M Reshard Guillet, PA-C ?

## 2022-03-03 NOTE — ED Provider Triage Note (Signed)
Emergency Medicine Provider Triage Evaluation Note ? ?Patrick Roth , a 76 y.o. male  was evaluated in triage.  Pt complains of chest discomfort. ? ?Review of Systems  ?Positive: Chest pressure, shoulder pain, nausea ?Negative: Fever, cough, sob ? ?Physical Exam  ?BP (!) 147/105 (BP Location: Right Arm)   Pulse 99   Temp 98.1 ?F (36.7 ?C) (Oral)   Resp 17   SpO2 100%  ?Gen:   Awake, no distress   ?Resp:  Normal effort  ?MSK:   Moves extremities without difficulty  ?Other:   ? ?Medical Decision Making  ?Medically screening exam initiated at 1:00 PM.  Appropriate orders placed.  Patrick Roth was informed that the remainder of the evaluation will be completed by another provider, this initial triage assessment does not replace that evaluation, and the importance of remaining in the ED until their evaluation is complete. ? ?Intermittent upper abd/chest pressure radiates to shoulder and with chills and nausea since Sunday.  Noticed HR in the 180s this AM, sent here by PCP for further care.   ?  ?Domenic Moras, PA-C ?03/03/22 1302 ? ?

## 2022-03-03 NOTE — ED Notes (Signed)
All discharge instructions including follow up care reviewed with patient and patient verbalized understanding of same. Patient stable and ambulatory at time of discharge.  

## 2022-03-03 NOTE — ED Provider Notes (Signed)
?[facility] ?Provider Note ? ? ?CSN: [id number] ?Arrival date & time: 03/03/22  1128 ? ?  ? ?History ? ?Chief Complaint  ?Patient presents with  ? Irregular Heart Beat  ? ? ?Patrick Roth is a 76 y.o. male. ? ?HPI ? ?Patient presents to the ED for evaluation of palpitations.  Patient has history of atrial fibrillation.  He states he previously had a cardioversion procedure in the past but subsequently went right back into atrial fibrillation.  Patient was ultimately treated with Tikosyn that was effective.  Patient states over the last week or so he has been having some episodes of GI discomfort.  He states when he has that episode of sensation to have a bowel movement he will start to feel discomfort in his chest.  He feels like his blood pressure and his heart rate increases.  Patient states after going to the bathroom he generally gets better.  Patient was able to have a bowel movement today.  He went to see his doctor and was noted to be in recurrent atrial flutter fib.  Patient's doctor called the cardiologist who suggested the patient come to the ED for work-up. ? ?Home Medications ?Prior to Admission medications   ?Medication Sig Start Date End Date Taking? Authorizing Provider  ?amLODipine (NORVASC) 10 MG tablet Take 10 mg by mouth daily.   Yes [provider]  ?apixaban (ELIQUIS) 5 MG TABS tablet Take 1 tablet by mouth twice daily ?Patient taking differently: Take 5 mg by mouth 2 (two) times daily. 02/19/22  Yes Camnitz, Ocie Doyne, MD  ?chlorthalidone (HYGROTON) 25 MG tablet Take 25 mg by mouth daily.    [provider]  ?dicyclomine (BENTYL) 20 MG tablet Take 1 tablet (20 mg total) by mouth every 6 (six) hours as needed for spasms. 11/01/21   Richardson Dopp T, PA-C  ?diltiazem (CARDIZEM CD) 180 MG 24 hr capsule Take 1 capsule (180 mg total) by mouth daily. 11/14/21   Baldwin Jamaica, PA-C  ?dofetilide (TIKOSYN) 250 MCG capsule Take 1 capsule (250  mcg total) by mouth 2 (two) times daily. 11/19/21   Sherran Needs, NP  ?famotidine (PEPCID) 20 MG tablet Take 1 tablet (20 mg total) by mouth daily. 11/01/21   Richardson Dopp T, PA-C  ?Magnesium Oxide 400 MG CAPS Take 1 capsule (400 mg total) by mouth 2 (two) times daily. 40/98/11   Delora Fuel, MD  ?metoprolol tartrate (LOPRESSOR) 25 MG tablet Take 1 tablet (25 mg total) by mouth 2 (two) times daily as needed (FOR HEARTRATE HIGHER THAN 120 bpm). ?Patient taking differently: Take 25 mg by mouth daily as needed (FOR HEARTRATE HIGHER THAN 120 bpm). 10/05/21   Baldwin Jamaica, PA-C  ?polyethylene glycol powder (GLYCOLAX/MIRALAX) 17 GM/SCOOP powder Take 17 g by mouth in the morning and at bedtime. Start with 1 scoop 2 times per day until bowels are moving, then reduce to 1 scoop daily. Drink 64oz of water daily. ?Patient taking differently: Take 17 g by mouth daily. 09/18/21   Jeanie Sewer, NP  ?potassium chloride SA (KLOR-CON M) 20 MEQ tablet Take 2 tablets (40 mEq total) by mouth 2 (two) times daily. ?Patient taking differently: Take 20 mEq by mouth 2 (two) times daily. Taking 1 tablet in the morning ,2 tablets in the evening 11/20/21   Sherran Needs, NP  ?REPATHA SURECLICK 914 MG/ML SOAJ INJECT 1 PEN INTO THE SKIN EVERY 14 (FOURTEEN) DAYS 01/07/22   Constance Haw, MD  ?   ? ?  Allergies    ?Crestor [rosuvastatin calcium], Spironolactone, Terazosin, Amiodarone, Atenolol, Bystolic [nebivolol hcl], Gabapentin, Hydralazine, Tizanidine, Clonidine, Hydrocodone-acetaminophen, Levofloxacin, Losartan, Maxzide [hydrochlorothiazide w-triamterene], Olmesartan, and Tussionex pennkinetic er [hydrocod poli-chlorphe poli er]   ? ?Review of Systems   ?Review of Systems  ?Constitutional:  Negative for fever.  ?Cardiovascular:  Negative for chest pain.  ? ?Physical Exam ?Updated Vital Signs ?BP 128/89   Pulse 96   Temp 98.1 ?F (36.7 ?C) (Oral)   Resp (!) 24   SpO2 100%  ?Physical Exam ?Vitals and nursing note  reviewed.  ?Constitutional:   ?   General: He is not in acute distress. ?   Appearance: He is well-developed.  ?HENT:  ?   Head: Normocephalic and atraumatic.  ?   Right Ear: External ear normal.  ?   Left Ear: External ear normal.  ?Eyes:  ?   General: No scleral icterus.    ?   Right eye: No discharge.     ?   Left eye: No discharge.  ?   Conjunctiva/sclera: Conjunctivae normal.  ?Neck:  ?   Trachea: No tracheal deviation.  ?Cardiovascular:  ?   Rate and Rhythm: Normal rate. Rhythm irregular.  ?Pulmonary:  ?   Effort: Pulmonary effort is normal. No respiratory distress.  ?   Breath sounds: Normal breath sounds. No stridor. No wheezing or rales.  ?Abdominal:  ?   General: Bowel sounds are normal. There is no distension.  ?   Palpations: Abdomen is soft.  ?   Tenderness: There is no abdominal tenderness. There is no guarding or rebound.  ?Musculoskeletal:     ?   General: No tenderness or deformity.  ?   Cervical back: Neck supple.  ?Skin: ?   General: Skin is warm and dry.  ?   Findings: No rash.  ?Neurological:  ?   General: No focal deficit present.  ?   Mental Status: He is alert.  ?   Cranial Nerves: No cranial nerve deficit (no facial droop, extraocular movements intact, no slurred speech).  ?   Sensory: No sensory deficit.  ?   Motor: No abnormal muscle tone or seizure activity.  ?   Coordination: Coordination normal.  ?Psychiatric:     ?   Mood and Affect: Mood normal.  ? ? ?ED Results / Procedures / Treatments   ?Labs ?(all labs ordered are listed, but only abnormal results are displayed) ?Labs Reviewed  ?BASIC METABOLIC PANEL - Abnormal; Notable for the following components:  ?    Result Value  ? Glucose, Bld 103 (*)   ? All other components within normal limits  ?CBC  ?TROPONIN I (HIGH SENSITIVITY)  ?TROPONIN I (HIGH SENSITIVITY)  ? ? ?EKG ?EKG Interpretation ? ?Date/Time:  Wednesday March 03 2022 12:39:40 EDT ?Ventricular Rate:  95 ?PR Interval:    ?QRS Duration: 138 ?QT Interval:  362 ?QTC  Calculation: 725 ?R Axis:   -76 ?Text Interpretation: Atrial flutter with variable A-V block Right bundle branch block Left anterior fascicular block  Bifascicular block  Cannot rule out Inferior infarct (masked by fascicular block?) , age undetermined Abnormal ECG When compared with ECG of 10-Dec-2021 20:06, Since last tracing rate slower Confirmed by Dorie Rank [phone]) on 03/03/2022 6:35:28 PM ? ?Radiology ?DG Chest 1 View ? ?Result Date: 03/03/2022 ?CLINICAL DATA:  Irregular heart rate, history of AFib. Chest pressure. EXAM: CHEST  1 VIEW COMPARISON:  Chest radiograph 12/10/2021, CT chest 12/10/2021 FINDINGS: The cardiomediastinal silhouette is within normal  limits. There is no focal consolidation or pulmonary edema. There is no pleural effusion or pneumothorax There is no acute osseous abnormality. IMPRESSION: Stable chest with no radiographic evidence of acute cardiopulmonary process. Electronically Signed   By: Valetta Mole M.D.   On: 03/03/2022 13:30   ? ?Procedures ?Procedures  ? ? ?Medications Ordered in ED ?Medications  ?metoprolol tartrate (LOPRESSOR) injection 5 mg (5 mg Intravenous Given 03/03/22 1859)  ? ? ?ED Course/ Medical Decision Making/ A&P ?Clinical Course as of 03/03/22 2047  ?Wed Mar 03, 2022  ?1610 Basic metabolic panel(!) [JK]  ?1845 Normal [JK]  ?1845 CBC ?Normal [JK]  ?1845 Troponin I (High Sensitivity) [JK]  ?[facility] images and radiology report reviewed.  No acute findings [JK]  ?1914 D [JK]  ?1916 Discussed findings with Dr Alona Bene.  Could consider cardioversion as he is on tikosyn now if patient is interested. [JK]  ?1924 Offered cardioversion to the patient.  He does not want to proceed with that this evening.  He is concerned about his GI issues precipitating his heart rhythm problems.  He would prefer to get addressed first and there are plans for that [JK]  ?2016 Troponin I (High Sensitivity) ?Delta troponin stable [JK]  ?9604 Basic metabolic  panel(!) ?Normal [JK]  ?2016 CBC [JK]  ?2016 Normal [JK]  ?  ?Clinical Course User Index ?[JK] Dorie Rank, MD  ? ?                        ?Medical Decision Making ?Amount and/or Complexity of Data Reviewed ?Labs:  Decision-making details

## 2022-03-03 NOTE — ED Notes (Signed)
No answer for labs x2 ?

## 2022-03-03 NOTE — ED Notes (Signed)
Patient resting comfortably, no complaints of chest pain. GCS 15 and answering all questions appropriately. NIBP, cardiac monitor and pulse ox remain in place. Bed in low and locked position and call bell remains in reach.  ?

## 2022-03-03 NOTE — Discharge Instructions (Signed)
Continue your current medications.  Follow-up with your GI doctor and cardiology doctors as planned.  The A-fib clinic should be calling you to schedule a follow-up appointment ?

## 2022-03-03 NOTE — ED Notes (Signed)
Repeat EKG given to Dr.Knapp. ?

## 2022-03-03 NOTE — ED Triage Notes (Signed)
Pt having recent episodes of irregular heart rate, hx of afib. Pt having chest pressure this am, denies sob.  ?

## 2022-03-03 NOTE — Patient Instructions (Addendum)
Good to see you this morning. ?I spoke with your cardiologist, Dr. Mertie Moores, and he advised me to have you present to [facility] ER right away.  ?

## 2022-03-03 NOTE — Progress Notes (Signed)
Discussed with Dr. Tomi Bamberger. ?Mildly symptomatic atrial flutter with minimal RVR. ?This appears to be his first symptomatic breakthrough since initiation of dofetilide in December. ?He is compliant with anticoagulation, but plans a flex sig evaluation in less than 2 weeks. He does not want to delay this since he believes that his GI issues are the usual trigger for his arrhythmia. ?Offered DCCV in ED, but he does not want that done now, especially if it postpones the GI workup. ?On diltiazem and dofetilide and Eliquis chronically. He already has a prescription for metoprolol tartrate to take as needed for tachycardia, which he can continue taking. ?Will arrange AFib clinic follow up. ? ?

## 2022-03-04 ENCOUNTER — Telehealth (HOSPITAL_COMMUNITY): Payer: Self-pay

## 2022-03-04 NOTE — Telephone Encounter (Signed)
Reached out to patient to schedule ED f/u. No answer left voicemail with telephone number. ?

## 2022-03-11 ENCOUNTER — Encounter (HOSPITAL_COMMUNITY): Payer: Self-pay | Admitting: Physician Assistant

## 2022-03-11 ENCOUNTER — Ambulatory Visit (HOSPITAL_COMMUNITY)
Admission: RE | Admit: 2022-03-11 | Discharge: 2022-03-11 | Disposition: A | Discharge status: Home / Self Care | Payer: Medicare HMO | Source: Ambulatory Visit | Attending: Physician Assistant | Admitting: Physician Assistant

## 2022-03-11 VITALS — BP 134/80 | HR 59 | Ht 68.0 in | Wt 205.6 lb

## 2022-03-11 DIAGNOSIS — Z7901 Long term (current) use of anticoagulants: Secondary | ICD-10-CM | POA: Insufficient documentation

## 2022-03-11 DIAGNOSIS — I10 Essential (primary) hypertension: Secondary | ICD-10-CM | POA: Insufficient documentation

## 2022-03-11 DIAGNOSIS — D6869 Other thrombophilia: Secondary | ICD-10-CM | POA: Diagnosis not present

## 2022-03-11 DIAGNOSIS — Z6831 Body mass index (BMI) 31.0-31.9, adult: Secondary | ICD-10-CM | POA: Insufficient documentation

## 2022-03-11 DIAGNOSIS — I4510 Unspecified right bundle-branch block: Secondary | ICD-10-CM | POA: Insufficient documentation

## 2022-03-11 DIAGNOSIS — I480 Paroxysmal atrial fibrillation: Secondary | ICD-10-CM | POA: Diagnosis not present

## 2022-03-11 DIAGNOSIS — I4819 Other persistent atrial fibrillation: Secondary | ICD-10-CM | POA: Insufficient documentation

## 2022-03-11 DIAGNOSIS — Z8719 Personal history of other diseases of the digestive system: Secondary | ICD-10-CM | POA: Insufficient documentation

## 2022-03-11 DIAGNOSIS — Z79899 Other long term (current) drug therapy: Secondary | ICD-10-CM | POA: Insufficient documentation

## 2022-03-11 DIAGNOSIS — Z8249 Family history of ischemic heart disease and other diseases of the circulatory system: Secondary | ICD-10-CM | POA: Insufficient documentation

## 2022-03-11 DIAGNOSIS — I440 Atrioventricular block, first degree: Secondary | ICD-10-CM | POA: Insufficient documentation

## 2022-03-11 DIAGNOSIS — E785 Hyperlipidemia, unspecified: Secondary | ICD-10-CM | POA: Diagnosis not present

## 2022-03-11 DIAGNOSIS — E669 Obesity, unspecified: Secondary | ICD-10-CM | POA: Diagnosis not present

## 2022-03-11 DIAGNOSIS — Z8673 Personal history of transient ischemic attack (TIA), and cerebral infarction without residual deficits: Secondary | ICD-10-CM | POA: Diagnosis not present

## 2022-03-11 DIAGNOSIS — I4892 Unspecified atrial flutter: Secondary | ICD-10-CM | POA: Insufficient documentation

## 2022-03-11 DIAGNOSIS — I498 Other specified cardiac arrhythmias: Secondary | ICD-10-CM | POA: Insufficient documentation

## 2022-03-11 NOTE — Progress Notes (Signed)
? ? ?Primary Care Physician: Allwardt, [name], PA-C ?Primary Cardiologist: Dr Acie Fredrickson (remote) ?Primary Electrophysiologist: Dr [name] ?Referring Physician: Oda Kilts ? ? ?Patrick Roth is a 76 y.o. male with a history of HTN, paroxysmal atrial fibrillation, prior CVA, HLD who presents for follow up in the [facility]. He was admitted in 7/17 with syncope felt to be 2/2 orthostatic hypotension. He was found to be in new onset AF with RVR.  Patient is on Eliquis for a CHADS2VASC score of 5. He has also been maintained on amiodarone for rhythm control. He saw Oda Kilts on 08/21/20 and was in atrial flutter at that time. His amiodarone was increased to 200 mg daily. Patient prefers to avoid surgical procedures. He actually stopped amiodarone several months prior to his visit on his own. S/p dofetilide loading.  ? ?On follow up today, patient was seen at the ED 03/03/22 with abdominal discomfort and atrial flutter. Patient reports that he has symptoms of bilateral arm and leg tingling which is relieved with bowel movements. He is scheduled for workup with GI. He was found to be back in atrial flutter at his PCP office and was instructed to go to the ED. He was minimally symptomatic. He is in SR today and felt he converted later the same day after leaving the ED. Patient believes his GI issues where his trigger. He has done well on dofetilide overall.  ? ?Today, he denies symptoms of palpitations, chest pain, shortness of breath, orthopnea, PND, lower extremity edema, dizziness, presyncope, syncope, snoring, daytime somnolence, bleeding, or neurologic sequela. The patient is tolerating medications without difficulties and is otherwise without complaint today.  ? ? ?Atrial Fibrillation Risk Factors: ? ?he does not have symptoms or diagnosis of sleep apnea. ?he does not have a history of rheumatic fever. ? ? ?he has a BMI of Body mass index is 31.26 kg/m?Marland KitchenMarland Kitchen ?Filed Weights  ? 03/11/22 1031   ?Weight: 93.3 kg  ? ? ?Family History  ?Problem Relation Age of Onset  ? Hypertension Father 30  ?     Deceased  ? Heart disease Father   ? Lung cancer Mother 61  ?     Deceased  ? Healthy Sister   ?     x3  ? Healthy Brother   ?     x4  ? Heart disease Brother   ?     #5  ? Other Daughter   ?     Alpha Thalassemia  ? Lupus Daughter   ?     #2-deceased  ? Diabetes Neg Hx   ? Heart attack Neg Hx   ? Hyperlipidemia Neg Hx   ? Sudden death Neg Hx   ? ? ? ?Atrial Fibrillation Management history: ? ?Previous antiarrhythmic drugs: amiodarone, dofetilide  ?Previous cardioversions: 2018 ?Previous ablations: none ?CHADS2VASC score: 5 ?Anticoagulation history: Xarelto, Eliquis ? ? ?Past Medical History:  ?Diagnosis Date  ? Atrial fibrillation/flutter   ? Intol of amio, beta blocker // Echo 12/22: inf HK, EF 50-55, normal RVSF, mild LaE, mild MR, mod MAC, AV sclerosis w/o aS  ? Colon polyps   ? Constipation 05/27/2015  ? DOE (dyspnea on exertion) 10/13/2015  ? Faintness 06/20/2016  ? History of chicken pox   ? Hyperlipidemia   ? Hypertension   ? Internal hemorrhoids   ? Polycythemia   ? Sciatica   ? Stroke [facility])   ? Hx of stroke in2008; recurrent CVA in 12/2020  ? Syncope  and collapse 06/28/2016  ? ?Past Surgical History:  ?Procedure Laterality Date  ? CARDIOVERSION N/A 09/02/2017  ? Procedure: CARDIOVERSION;  Surgeon: [name], MD;  Location: [facility] ENDOSCOPY;  Service: Cardiovascular;  Laterality: N/A;  ? COLONOSCOPY  2011  ? great toe surgery    ? PROSTATE BIOPSY    ? ? ?Current Outpatient Medications  ?Medication Sig Dispense Refill  ? amLODipine (NORVASC) 10 MG tablet Take 10 mg by mouth daily.    ? apixaban (ELIQUIS) 5 MG TABS tablet Take 1 tablet by mouth twice daily 60 tablet 10  ? dicyclomine (BENTYL) 20 MG tablet Take 1 tablet (20 mg total) by mouth every 6 (six) hours as needed for spasms. 30 tablet 0  ? diltiazem (CARDIZEM CD) 180 MG 24 hr capsule Take 1 capsule (180 mg total) by mouth daily. 30 capsule 5  ?  dofetilide (TIKOSYN) 250 MCG capsule Take 1 capsule (250 mcg total) by mouth 2 (two) times daily. 60 capsule 5  ? famotidine (PEPCID) 20 MG tablet Take 1 tablet (20 mg total) by mouth daily. 30 tablet 0  ? Magnesium Oxide 400 MG CAPS Take 1 capsule (400 mg total) by mouth 2 (two) times daily. 60 capsule 0  ? metoprolol tartrate (LOPRESSOR) 25 MG tablet Take 1 tablet (25 mg total) by mouth 2 (two) times daily as needed (FOR HEARTRATE HIGHER THAN 120 bpm). 90 tablet 0  ? polyethylene glycol powder (GLYCOLAX/MIRALAX) 17 GM/SCOOP powder Take 17 g by mouth in the morning and at bedtime. Start with 1 scoop 2 times per day until bowels are moving, then reduce to 1 scoop daily. Drink 64oz of water daily. 3350 g 1  ? potassium chloride SA (KLOR-CON M) 20 MEQ tablet Take 2 tablets (40 mEq total) by mouth 2 (two) times daily. 120 tablet 3  ? REPATHA SURECLICK 664 MG/ML SOAJ INJECT 1 PEN INTO THE SKIN EVERY 14 (FOURTEEN) DAYS 6 mL 3  ? ?No current facility-administered medications for this encounter.  ? ? ?Allergies  ?Allergen Reactions  ? Crestor [Rosuvastatin Calcium] Shortness Of Breath  ?  Muscle cramps  ? Spironolactone Shortness Of Breath  ? Terazosin Shortness Of Breath, Palpitations and Other (See Comments)  ?  Nerve pain, also  ? Amiodarone Other (See Comments)  ?  Dropped pulse real low to 45-50, and made him feel very cold  ? Atenolol Other (See Comments)  ?  Drowsiness and "flu sxs"  ? Bystolic [Nebivolol Hcl] Other (See Comments)  ?  GI issues  ? Gabapentin Other (See Comments)  ?  "Did not help with pain"  ? Hydralazine Other (See Comments)  ?  Joint swelling  ? Tizanidine Other (See Comments)  ?  Syncope, Elevated BP/pulse  ? Clonidine Other (See Comments)  ?  Unknown reaction  ? Hydrocodone-Acetaminophen Other (See Comments)  ?  Unknown reaction  ? Levofloxacin Other (See Comments)  ?  Unknown reaction  ? Losartan Other (See Comments)  ?  Did not work.  Pt states he couldn't take b/c of SE, but can't remember  what SE were.  ? Maxzide [Hydrochlorothiazide W-Triamterene] Other (See Comments)  ?  Does not tolerate potassium-sparing diuretics  ? Olmesartan Other (See Comments)  ?  Unknown reaction  ? Tussionex Pennkinetic Er [Hydrocod Poli-Chlorphe Poli Er] Other (See Comments)  ?  Unknown reaction  ? ? ?Social History  ? ?Socioeconomic History  ? Marital status: Married  ?  Spouse name: [name]  ? Number of children: 1  ?  Years of education: 36  ? Highest education level: Not on file  ?Occupational History  ? Occupation: Retired  ?Tobacco Use  ? Smoking status: Never  ? Smokeless tobacco: Never  ? Tobacco comments:  ?  Never smoke 03/11/22  ?Vaping Use  ? Vaping Use: Never used  ?Substance and Sexual Activity  ? Alcohol use: No  ? Drug use: No  ? Sexual activity: Not on file  ?Other Topics Concern  ? Not on file  ?Social History Narrative  ? Lives with wife  ? Right Handed  ? Drinks no caffeine  ? ?Social Determinants of Health  ? ?Financial Resource Strain: Low Risk   ? Difficulty of Paying Living Expenses: Not hard at all  ?Food Insecurity: No Food Insecurity  ? Worried About Charity fundraiser in the Last Year: Never true  ? Ran Out of Food in the Last Year: Never true  ?Transportation Needs: No Transportation Needs  ? Lack of Transportation (Medical): No  ? Lack of Transportation (Non-Medical): No  ?Physical Activity: Inactive  ? Days of Exercise per Week: 0 days  ? Minutes of Exercise per Session: 0 min  ?Stress: No Stress Concern Present  ? Feeling of Stress : Not at all  ?Social Connections: Moderately Isolated  ? Frequency of Communication with Friends and Family: More than three times a week  ? Frequency of Social Gatherings with Friends and Family: More than three times a week  ? Attends Religious Services: Never  ? Active Member of Clubs or Organizations: No  ? Attends Archivist Meetings: Never  ? Marital Status: Married  ?Intimate Partner Violence: Not At Risk  ? Fear of Current or Ex-Partner: No  ?  Emotionally Abused: No  ? Physically Abused: No  ? Sexually Abused: No  ? ? ? ?ROS- All systems are reviewed and negative except as per the HPI above. ? ?Physical Exam: ?Vitals:  ? 03/11/22 1031  ?BP: 134

## 2022-03-15 DIAGNOSIS — K219 Gastro-esophageal reflux disease without esophagitis: Secondary | ICD-10-CM | POA: Diagnosis not present

## 2022-03-15 DIAGNOSIS — Z6831 Body mass index (BMI) 31.0-31.9, adult: Secondary | ICD-10-CM | POA: Diagnosis not present

## 2022-03-15 DIAGNOSIS — Z8673 Personal history of transient ischemic attack (TIA), and cerebral infarction without residual deficits: Secondary | ICD-10-CM | POA: Diagnosis not present

## 2022-03-15 DIAGNOSIS — R152 Fecal urgency: Secondary | ICD-10-CM | POA: Diagnosis not present

## 2022-03-15 DIAGNOSIS — I10 Essential (primary) hypertension: Secondary | ICD-10-CM | POA: Diagnosis not present

## 2022-03-15 DIAGNOSIS — E669 Obesity, unspecified: Secondary | ICD-10-CM | POA: Diagnosis not present

## 2022-03-15 DIAGNOSIS — E782 Mixed hyperlipidemia: Secondary | ICD-10-CM | POA: Diagnosis not present

## 2022-03-15 DIAGNOSIS — K6289 Other specified diseases of anus and rectum: Secondary | ICD-10-CM | POA: Diagnosis not present

## 2022-03-15 DIAGNOSIS — R1030 Lower abdominal pain, unspecified: Secondary | ICD-10-CM | POA: Diagnosis not present

## 2022-03-15 DIAGNOSIS — I4891 Unspecified atrial fibrillation: Secondary | ICD-10-CM | POA: Diagnosis not present

## 2022-03-22 ENCOUNTER — Ambulatory Visit (INDEPENDENT_AMBULATORY_CARE_PROVIDER_SITE_OTHER): Payer: Medicare HMO | Admitting: Pharmacist

## 2022-03-22 DIAGNOSIS — I4891 Unspecified atrial fibrillation: Secondary | ICD-10-CM

## 2022-03-22 DIAGNOSIS — I10 Essential (primary) hypertension: Secondary | ICD-10-CM

## 2022-03-22 NOTE — Progress Notes (Signed)
Chronic Care Management Pharmacy Note  03/23/2022 Name:  Patrick Roth MRN:  [id number] DOB:  [birth year]  Summary: FU with PharmD.  He has been having episodes of home BP severely elevated 200s systolic per his reports.  Have reviewed ER precautions.  These tend to coincide with bowel spasms that start in his legs and travel towards the anus.  Most of the time BP is controlled.  Have asked him to monitor at home and will FU in two weeks.  Subjective: Patrick Roth is an 76 y.o. year old male who is a primary patient of Allwardt, Alyssa M, PA-C.  The CCM team was consulted for assistance with disease management and care coordination needs.    Engaged with patient by telephone for follow up visit in response to provider referral for pharmacy case management and/or care coordination services.   Consent to Services:  The patient was given information about Chronic Care Management services, agreed to services, and gave verbal consent prior to initiation of services.  Please see initial visit note for detailed documentation.   Patient Care Team: Allwardt, Crist Infante, PA-C as PCP - General (Physician Assistant) Regan Lemming, MD as PCP - Electrophysiology (Cardiology) [name], Deloris Ping, MD as PCP - Cardiology (Cardiology) Hilarie Fredrickson, MD as Consulting Physician (Gastroenterology) [name], Deloris Ping, MD as Consulting Physician (Cardiology) Wynona Canes, MD as Referring Physician (Urology) Merwyn Katos, DPM as Consulting Physician (Podiatry) Lorna Dibble, MD as Referring Physician (Neurosurgery) Begovich, Stacey Drain, DO (Sports Medicine) Erroll Luna, [facility] (Pharmacist)  Recent office visits:  11/19/2021 (A-fib Visit) Newman Nip, NP; 1 week s/p Tikosyn   10/19/2021 OV (PCP) Allwardt, Crist Infante, PA-C; -His chronic constipation 2/2 opioids does seem to be causing him grief and we talked about Linzess as a possibility. As he is established with GI, I would like to run  this by them and get back to the patient right away. I have sent a phone message out to his office today   09/18/2021 VV Dulce Sellar, NP; generic Miralax to start bid, then qd.   09/17/2021 VV (PCP) Allwardt, Crist Infante, PA-C; Ultimately decided against anti-viral therapy at this time as he is unsure when his symptoms exactly started and he is doing fairly well overall   Recent consult visits:  11/20/2021 OV (Orthopaedic, Sports Medicine, Spine) Begovich, Stacey Drain, DO; no medication changes indicated.   11/03/2021 OV (Cardiology) Sheilah Pigeon, PA-C; no medication changes indicated.   10/05/2021 OV (Cardiology) Sheilah Pigeon, PA-C; I have given him lopressor 25mg  BID PRN for HR >120/symptoms Discussed NOT to use if HR is not [facility] visits:  03/03/22 - ED visit due to chest discomfort.  Ultimately, everything was normal.  He was safe to go home and to follow up with cardiology.   Objective:    Latest Ref Rng & Units 03/03/2022    1:07 PM 02/15/2022   11:47 AM 12/25/2021   10:51 AM  BMP  Glucose 70 - 99 mg/dL 782   96   97    BUN 8 - 23 mg/dL 11   16   11     Creatinine 0.61 - 1.24 mg/dL 9.56   2.13   0.86    Sodium 135 - 145 mmol/L 137   139   139    Potassium 3.5 - 5.1 mmol/L 4.2   3.9   3.8    Chloride 98 - 111 mmol/L 103  104   102    CO2 22 - 32 mmol/L 27   31   29     Calcium 8.9 - 10.3 mg/dL 9.3   9.4   9.7     Lab Results  Component Value Date   CREATININE 1.22 03/03/2022   CREATININE 1.06 02/15/2022   CREATININE 1.12 12/25/2021    Lab Results  Component Value Date   HGBA1C 5.8 (H) 01/17/2021   Last diabetic Eye exam: No results found for: HMDIABEYEEXA  Last diabetic Foot exam: No results found for: HMDIABFOOTEX      Component Value Date/Time   CHOL 154 04/13/2021 0933   TRIG 87 04/13/2021 0933   HDL 55 04/13/2021 0933   CHOLHDL 2.8 04/13/2021 0933   CHOLHDL 4.6 01/17/2021 0036   VLDL 11 01/17/2021 0036   LDLCALC 83 04/13/2021 0933        Latest Ref Rng & Units 02/15/2022   11:47 AM 12/25/2021   10:51 AM 10/28/2021   10:30 AM  Hepatic Function  Total Protein 6.0 - 8.3 g/dL 7.0   7.4   7.4    Albumin 3.5 - 5.2 g/dL 4.3   4.2   4.3    AST 0 - 37 U/L 20   17   22     ALT 0 - 53 U/L 14   12   21     Alk Phosphatase 39 - 117 U/L 81   74   64    Total Bilirubin 0.2 - 1.2 mg/dL 1.0   1.5   1.9      Lab Results  Component Value Date/Time   TSH 1.89 02/15/2022 11:47 AM   TSH 2.485 10/04/2021 09:37 AM   TSH 3.801 01/17/2021 12:36 AM   TSH 1.640 08/21/2020 11:03 AM   FREET4 0.67 02/15/2022 11:47 AM       Latest Ref Rng & Units 03/03/2022    1:07 PM 12/25/2021   10:51 AM 12/10/2021    8:13 PM  CBC  WBC 4.0 - 10.5 K/uL 4.8   6.1   5.3    Hemoglobin 13.0 - 17.0 g/dL 16.1   09.6   04.5    Hematocrit 39.0 - 52.0 % 49.2   48.3   50.8    Platelets 150 - 400 K/uL 180   175   207     No results found for: VD25OH  Clinical ASCVD: Yes  The ASCVD Risk score (Arnett DK, et al., 2019) failed to calculate for the following reasons:   The patient has a prior MI or stroke diagnosis    Social History   Tobacco Use  Smoking Status Never  Smokeless Tobacco Never  Tobacco Comments   Never smoke 03/11/22   BP Readings from Last 3 Encounters:  03/11/22 134/80  03/03/22 128/89  03/03/22 (!) 152/95   Pulse Readings from Last 3 Encounters:  03/11/22 (!) 59  03/03/22 96  03/03/22 100   Wt Readings from Last 3 Encounters:  03/11/22 205 lb 9.6 oz (93.3 kg)  03/03/22 205 lb 9.6 oz (93.3 kg)  02/15/22 207 lb (93.9 kg)   Assessment: Review of patient past medical history, allergies, medications, health status, including review of consultants reports, laboratory and other test data, was performed as part of comprehensive evaluation and provision of chronic care management services.   SDOH:  (Social Determinants of Health) assessments and interventions performed: Yes.   CCM Care Plan  Allergies  Allergen Reactions   Crestor  [Rosuvastatin Calcium] Shortness Of  Breath    Muscle cramps   Spironolactone Shortness Of Breath   Terazosin Shortness Of Breath, Palpitations and Other (See Comments)    Nerve pain, also   Amiodarone Other (See Comments)    Dropped pulse real low to 45-50, and made him feel very cold   Atenolol Other (See Comments)    Drowsiness and "flu sxs"   Bystolic [Nebivolol Hcl] Other (See Comments)    GI issues   Gabapentin Other (See Comments)    "Did not help with pain"   Hydralazine Other (See Comments)    Joint swelling   Tizanidine Other (See Comments)    Syncope, Elevated BP/pulse   Clonidine Other (See Comments)    Unknown reaction   Hydrocodone-Acetaminophen Other (See Comments)    Unknown reaction   Levofloxacin Other (See Comments)    Unknown reaction   Losartan Other (See Comments)    Did not work.  Pt states he couldn't take b/c of SE, but can't remember what SE were.   Maxzide [Hydrochlorothiazide W-Triamterene] Other (See Comments)    Does not tolerate potassium-sparing diuretics   Olmesartan Other (See Comments)    Unknown reaction   Tussionex Pennkinetic Er [Hydrocod Poli-Chlorphe Poli Er] Other (See Comments)    Unknown reaction    Medications Reviewed Today     Reviewed by Erroll Luna, [facility] (Pharmacist) on 03/23/22 at 1216  Med List Status: <None>   Medication Order Taking? Sig Documenting Provider Last Dose Status Informant  amLODipine (NORVASC) 10 MG tablet [id number]  Take 10 mg by mouth daily. [provider]  Active Self  apixaban (ELIQUIS) 5 MG TABS tablet [id number]  Take 1 tablet by mouth twice daily Camnitz, Will Daphine Deutscher, MD  Active   dicyclomine (BENTYL) 20 MG tablet [id number] No Take 1 tablet (20 mg total) by mouth every 6 (six) hours as needed for spasms.  Patient not taking: Reported on 03/23/2022   Beatrice Lecher, PA-C Not Taking Active Self  diltiazem (CARDIZEM CD) 180 MG 24 hr capsule [id number] No Take 1 capsule (180 mg total) by mouth  daily.  Patient not taking: Reported on 03/23/2022   Sheilah Pigeon, PA-C Not Taking Active Self  dofetilide (TIKOSYN) 250 MCG capsule [id number]  Take 1 capsule (250 mcg total) by mouth 2 (two) times daily. Newman Nip, NP  Active Self  famotidine (PEPCID) 20 MG tablet [id number]  Take 1 tablet (20 mg total) by mouth daily. Tereso Newcomer T, [place]  Active Self  Magnesium Oxide 400 MG CAPS [id number]  Take 1 capsule (400 mg total) by mouth 2 (two) times daily. Dione Booze, MD  Active Self  metoprolol tartrate (LOPRESSOR) 25 MG tablet [id number]  Take 1 tablet (25 mg total) by mouth 2 (two) times daily as needed (FOR HEARTRATE HIGHER THAN 120 bpm). Sheilah Pigeon, PA-C  Active Self  polyethylene glycol powder (GLYCOLAX/MIRALAX) 17 GM/SCOOP powder [id number]  Take 17 g by mouth in the morning and at bedtime. Start with 1 scoop 2 times per day until bowels are moving, then reduce to 1 scoop daily. Drink 64oz of water daily. Dulce Sellar, NP  Active Self           Med Note Willa Rough, APRIL S   Tue Nov 10, 2021 10:16 AM)    potassium chloride SA (KLOR-CON M) 20 MEQ tablet [id number]  Take 2 tablets (40 mEq total) by mouth 2 (two) times daily. Newman Nip, NP  Active Self  Med Note Pollyann Kennedy Mar 11, 2022 10:29 AM)    REPATHA SURECLICK 140 MG/ML SOAJ [id number]  INJECT 1 PEN INTO THE SKIN EVERY 14 (FOURTEEN) DAYS Regan Lemming, MD  Active             Patient Active Problem List   Diagnosis Date Noted   Afib [facility]) 11/10/2021   Dyssynergic defecation 11/01/2021   GERD (gastroesophageal reflux disease) 11/01/2021   PAF (paroxysmal atrial fibrillation) (HCC) 10/31/2021   Atrial fibrillation with rapid ventricular response (HCC)    Aortic atherosclerosis (HCC) 10/28/2021   Wide-complex tachycardia 10/28/2021   Rectal sphincter spasm 10/28/2021   Paroxysmal atrial fibrillation (HCC) 05/26/2021   Polycythemia 05/26/2021   Statin myopathy 01/29/2021   Acute  arterial ischemic stroke, multifocal, anterior circulation, left (HCC) 01/16/2021   Secondary hypercoagulable state (HCC) 09/11/2020   Cyst of perineum in male 11/28/2018   Mixed hyperlipidemia    History of chicken pox    Colon polyps    Atrial flutter with rapid ventricular response (HCC)    Other intervertebral disc degeneration, lumbar region 05/09/2017   Lumbar radiculopathy 03/29/2017   Persistent atrial fibrillation (HCC) 09/14/2016   Diuretic-induced hypokalemia 06/28/2016   Essential hypertension 05/27/2015   Constipation due to pain medication 05/27/2015   Mallet deformity of fourth finger, right 07/11/2013   Stroke (HCC) 08/23/2012    Immunization History  Administered Date(s) Administered   Influenza,inj,Quad PF,6+ Mos 07/23/2015   Influenza-Unspecified 07/23/2018   PFIZER(Purple Top)SARS-COV-2 Vaccination 12/24/2019, 01/21/2020, 09/06/2020   Pneumococcal Polysaccharide-23 11/13/2021   Tdap 07/05/2013    Conditions to be addressed/monitored: Atrial Fibrillation, HTN and HLD  Care Plan : CCM Pharmacy Care Plan  Updates made by Erroll Luna, RPH since 03/23/2022 12:00 AM     Problem: HLD HTN AFib   Priority: High     Long-Range Goal: Patient-Specific Goal   Start Date: 03/04/2021  Expected End Date: 03/04/2022  Recent Progress: On track  Priority: High  Note:   Current Barriers:  Fluctuating BP, bowel spasms  Pharmacist Clinical Goal(s):  Patient will verbalize ability to afford treatment regimen contact provider office for questions/concerns as evidenced notation of same in electronic health record through collaboration with PharmD and provider.   Interventions: 1:1 collaboration with Allwardt, Crist Infante, PA-C regarding development and update of comprehensive plan of care as evidenced by provider attestation and co-signature Inter-disciplinary care team collaboration (see longitudinal plan of care) Comprehensive medication review performed; medication  list updated in electronic medical record No medication changes  Hypertension (BP goal <140/90) -Controlled -Current treatment: Amlodipine 10 mg daily Appropriate, Effective, Safe, Accessible -Denies issues with potassium tolerability, able to take 20 meq tablets 3x/day without a problem.   -Current home readings: when symptomatic  -Current dietary habits: no specific diet. 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 Admits to not doing the best with hydradtion. -Denies hypotensive/hypertensive symptoms -Educated on BP goals and benefits of medications for prevention of heart attack, stroke and kidney damage; -Counseled to monitor BP at home, document, and provide log at future appointments -Recommended to continue current medication Counseled on staying hydrated especially during times of intense physical activity  Update 09/07/21 Reports BP has been controlled, pulse has been "up and down." No specific logs at this time.  He denies any symptoms such as dizziness or HA's. He is now taking his chlorthalidone in the morning and he tolerates well. Encouraged to maintain hydration and continue  medications adherence.  He continues to be very active around his house.  Continue current meds at this time.  Update 03/23/22 He reports having BP episodes that are very irregular.  Reports some systolic BP in the 200s where he is having episodes with what sounds like bowel spasms.  Other days his BP is completely normal.  BP has been normal in the office.  Saw GI and no clear explanation of why he is having these GI symptoms.  ER precautions discussed. He is going to monitor BP and record and we will call him in 2-3 weeks to check in.  No changes at this time.  Hyperlipidemia: (LDL goal < 70) -Not ideally controlled per 03/2021 labs  -Repatha start 01/2021 -Current treatment: Repatha 140 mg injection every 14 days (01/2021) -Medications  previously tried: statin intolerant.  -Reports that everything is taken care of from a cost standpoint through lipid clinic. Will not be responsible for OOP spending due to approval of [employer] grant.  -Educated on Cholesterol goals;  -Recommended to continue current medication  Atrial Fibrillation (Goal: prevent stroke and major bleeding) -Controlled -Current treatment: Rate control: Metoprolol succinate 25 mg prn for HR > 120  Dofetilide Appropriate, Effective, Safe, Accessible Anticoagulation:  Eliquis 5 mg twice daily Appropriate, Effective, Safe, Accessible -Denies costs concerns with Eliquis, currently about $40 dollars/month. Not wanting to pursue patient assistance at this time -Home BP and HR readings: n/a  -Counseled on increased risk of stroke due to Afib and benefits of anticoagulation for stroke prevention; importance of adherence to anticoagulant exactly as prescribed; -Recommended to continue current medication, again emphasized adequate water intake.  Update 09/07/21 Does mention cost burden with Eliquis at this time.  He has not previously applied for patient assistance and is interested at this time in an application.  I discussed the OOP spend with him and the requirements for approval.  Will have application sent to his house for completion. Denies abnormal bleeding/bruising.  He is requesting a new hematologist so will reach out to PCP for referral.  Continue current meds  Update 03/23/22 Recently started Tikosyn and doing well on it.  Reports he is very happy with how he feels since starting this medication.  Working on controlling BP.  HR appears to be controlled.  Denies any concern with cost at this time. No changes will continue to follow BP.  Patient Goals/Self-Care Activities Patient will:  - take medications as prescribed  Follow Up Plan: [facility] telephone f/u 4-6 months Medication Assistance: None required.  Patient affirms current coverage meets  needs.  Patient's preferred pharmacy is:  [facility] [address] [place] - [address] [place] [address] Phone: [phone] Fax: [phone]  Follow Up:  Patient agrees to Care Plan and Follow-up.          Future Appointments  Date Time Provider Department Center  03/23/2022  1:30 PM Allwardt, Milus Mallick [facility]  08/12/2022  2:00 PM Micki Riley, MD [facility] None  08/18/2022  9:30 AM Allwardt, Crist Infante, PA-C [facility]  10/25/2022  1:45 PM [facility] HEALTH COACH [facility]    Willa Frater, PharmD Clinical Pharmacist  [facility] [phone]

## 2022-03-23 ENCOUNTER — Encounter: Payer: Self-pay | Admitting: Physician Assistant

## 2022-03-23 ENCOUNTER — Ambulatory Visit (INDEPENDENT_AMBULATORY_CARE_PROVIDER_SITE_OTHER): Payer: Medicare HMO | Admitting: Physician Assistant

## 2022-03-23 VITALS — BP 143/82 | HR 58 | Temp 98.0°F | Ht 68.0 in | Wt 205.6 lb

## 2022-03-23 DIAGNOSIS — K5903 Drug induced constipation: Secondary | ICD-10-CM

## 2022-03-23 DIAGNOSIS — K5902 Outlet dysfunction constipation: Secondary | ICD-10-CM | POA: Diagnosis not present

## 2022-03-23 DIAGNOSIS — M5416 Radiculopathy, lumbar region: Secondary | ICD-10-CM

## 2022-03-23 NOTE — Progress Notes (Signed)
? ?Subjective:  ? ? Patient ID: Patrick Roth, male    DOB: 18-Nov-1946, 76 y.o.   MRN: [id number] ? ?Chief Complaint  ?Patient presents with  ? Hip Pain  ?  Pt states that he took his AM medications, and his back began to hurt. He states that he chills started, legs began to swell, his hips began to get hot. And he had a BM.   ? Hypertension  ?  He denies Chest pain or SOB.  ? ? ?HPI ?Patient is in today for lower back / R leg pain. ? ?Used his weed-eater yesterday.  ?Burning sensation today, lower back, going into R leg. ?He states he's had this pain all his life when injured as a child. ? ?This morning he was experiencing heat /flushing feeling going up his legs into his back while having a bowel movement. Symptoms improved after having 2nd bowel movement. States this is not uncommon for him to have unusual vague symptoms with bowel movements, but usually has chilled feeling in his legs.  ? ?Follows with Dr. Arrie Eastern at [facility] for back issues.  ?Next appt is scheduled for the end of this month.  ? ? ? ?Past Medical History:  ?Diagnosis Date  ? Atrial fibrillation/flutter   ? Intol of amio, beta blocker // Echo 12/22: inf HK, EF 50-55, normal RVSF, mild LaE, mild MR, mod MAC, AV sclerosis w/o aS  ? Colon polyps   ? Constipation 05/27/2015  ? DOE (dyspnea on exertion) 10/13/2015  ? Faintness 06/20/2016  ? History of chicken pox   ? Hyperlipidemia   ? Hypertension   ? Internal hemorrhoids   ? Polycythemia   ? Sciatica   ? Stroke [facility])   ? Hx of stroke in2008; recurrent CVA in 12/2020  ? Syncope and collapse 06/28/2016  ? ? ?Past Surgical History:  ?Procedure Laterality Date  ? CARDIOVERSION N/A 09/02/2017  ? Procedure: CARDIOVERSION;  Surgeon: [name], MD;  Location: [facility] ENDOSCOPY;  Service: Cardiovascular;  Laterality: N/A;  ? COLONOSCOPY  2011  ? great toe surgery    ? PROSTATE BIOPSY    ? ? ?Family History  ?Problem Relation Age of Onset  ? Hypertension Father 29  ?     Deceased  ? Heart disease Father    ? Lung cancer Mother 40  ?     Deceased  ? Healthy Sister   ?     x3  ? Healthy Brother   ?     x4  ? Heart disease Brother   ?     #5  ? Other Daughter   ?     Alpha Thalassemia  ? Lupus Daughter   ?     #2-deceased  ? Diabetes Neg Hx   ? Heart attack Neg Hx   ? Hyperlipidemia Neg Hx   ? Sudden death Neg Hx   ? ? ?Social History  ? ?Tobacco Use  ? Smoking status: Never  ? Smokeless tobacco: Never  ? Tobacco comments:  ?  Never smoke 03/11/22  ?Vaping Use  ? Vaping Use: Never used  ?Substance Use Topics  ? Alcohol use: No  ? Drug use: No  ?  ? ?Allergies  ?Allergen Reactions  ? Crestor [Rosuvastatin Calcium] Shortness Of Breath  ?  Muscle cramps  ? Spironolactone Shortness Of Breath  ? Terazosin Shortness Of Breath, Palpitations and Other (See Comments)  ?  Nerve pain, also  ? Amiodarone Other (See Comments)  ?  Dropped pulse real low to 45-50, and made him feel very cold  ? Atenolol Other (See Comments)  ?  Drowsiness and "flu sxs"  ? Bystolic [Nebivolol Hcl] Other (See Comments)  ?  GI issues  ? Gabapentin Other (See Comments)  ?  "Did not help with pain"  ? Hydralazine Other (See Comments)  ?  Joint swelling  ? Tizanidine Other (See Comments)  ?  Syncope, Elevated BP/pulse  ? Clonidine Other (See Comments)  ?  Unknown reaction  ? Hydrocodone-Acetaminophen Other (See Comments)  ?  Unknown reaction  ? Levofloxacin Other (See Comments)  ?  Unknown reaction  ? Losartan Other (See Comments)  ?  Did not work.  Pt states he couldn't take b/c of SE, but can't remember what SE were.  ? Maxzide [Hydrochlorothiazide W-Triamterene] Other (See Comments)  ?  Does not tolerate potassium-sparing diuretics  ? Olmesartan Other (See Comments)  ?  Unknown reaction  ? Tussionex Pennkinetic Er [Hydrocod Poli-Chlorphe Poli Er] Other (See Comments)  ?  Unknown reaction  ? ? ?Review of Systems ?NEGATIVE UNLESS OTHERWISE INDICATED IN HPI ? ? ?   ?Objective:  ?  ? ?BP (!) 143/82 (BP Location: Left Arm, Patient Position: Sitting, Cuff  Size: Large)   Pulse (!) 58   Temp 98 ?F (36.7 ?C) (Temporal)   Ht _0  (1.727 m)   Wt 205 lb 9.6 oz (93.3 kg)   SpO2 98%   BMI 31.26 kg/m?  ? ?Wt Readings from Last 3 Encounters:  ?03/23/22 205 lb 9.6 oz (93.3 kg)  ?03/11/22 205 lb 9.6 oz (93.3 kg)  ?03/03/22 205 lb 9.6 oz (93.3 kg)  ? ? ?BP Readings from Last 3 Encounters:  ?03/23/22 (!) 143/82  ?03/11/22 134/80  ?03/03/22 128/89  ?  ? ?Physical Exam ?Vitals and nursing note reviewed.  ?Constitutional:   ?   General: He is not in acute distress. ?   Appearance: Normal appearance. He is not toxic-appearing.  ?HENT:  ?   Head: Normocephalic and atraumatic.  ?   Right Ear: External ear normal.  ?   Left Ear: External ear normal.  ?   Nose: Nose normal.  ?   Mouth/Throat:  ?   Mouth: Mucous membranes are moist.  ?   Pharynx: Oropharynx is clear.  ?Eyes:  ?   Extraocular Movements: Extraocular movements intact.  ?   Conjunctiva/sclera: Conjunctivae normal.  ?   Pupils: Pupils are equal, round, and reactive to light.  ?Cardiovascular:  ?   Rate and Rhythm: Normal rate and regular rhythm.  ?   Pulses: Normal pulses.  ?   Heart sounds: Normal heart sounds.  ?Pulmonary:  ?   Effort: Pulmonary effort is normal.  ?   Breath sounds: Normal breath sounds.  ?Musculoskeletal:     ?   General: Normal range of motion.  ?   Cervical back: Normal range of motion and neck supple.  ?   Lumbar back: No spasms, tenderness or bony tenderness. Negative right straight leg raise test and negative left straight leg raise test.  ?Skin: ?   General: Skin is warm and dry.  ?Neurological:  ?   General: No focal deficit present.  ?   Mental Status: He is alert and oriented to person, place, and time.  ?Psychiatric:     ?   Mood and Affect: Mood normal.     ?   Behavior: Behavior normal.  ? ? ?   ?Assessment & Plan:  ? ?  Problem List Items Addressed This Visit   ? ?  ? Digestive  ? Constipation due to pain medication  ?  ? Nervous and Auditory  ? Lumbar radiculopathy - Primary  ?  ? Other  ?  Dyssynergic defecation  ? ? ?1. Lumbar radiculopathy ?Acute on chronic low back pain secondary to use of weed eater yesterday. Negative for any fevers, saddle anesthesia, foot-drop, incontinence of urine or stool.  I encouraged the patient to take it easy the next few days and to stick to his regular medication regimen.  He is going to follow-up with Dr. Arrie Eastern. ? ?2. Dyssynergic defecation ?? Discuss with Dr. Arrie Eastern if pelvic floor PT might be helpful for him ? ?3. Constipation due to pain medication ?Better with daily use of MiraLAX ? ? ?This note was prepared with assistance of Systems analyst. Occasional wrong-word or sound-a-like substitutions may have occurred due to the inherent limitations of voice recognition software. ? ? ?Zamari Vea M Giovannina Mun, PA-C ?

## 2022-03-23 NOTE — Patient Instructions (Addendum)
Visit Information ? ? Goals Addressed   ? ?  ?  ?  ?  ? This Visit's Progress  ?  Track and Manage Heart Rate and Rhythm-Atrial Fibrillation   On track  ?  Timeframe:  Long-Range Goal ?Priority:  High ?Start Date:  09/07/2021                           ?Expected End Date:  03/08/2021                    ? ?Follow Up Date 12/08/21  ?  ?- check pulse (heart) rate once a day ?- make a plan to exercise regularly ?- make a plan to eat healthy ?- take medicine as prescribed  ?  ?Why is this important?   ?Atrial fibrillation may have no symptoms. Sometimes the symptoms get worse or happen more often.  ?It is important to keep track of what your symptoms are and when they happen.  ?A change in symptoms is important to discuss with your doctor or nurse.  ?Being active and healthy eating will also help you manage your heart condition.   ?  ?Notes:  ?  ? ?  ? ?Patient Care Plan: [place]  ?  ? ?Problem Identified: HLD HTN AFib   ?Priority: High  ?  ? ?Long-Range Goal: Patient-Specific Goal   ?Start Date: 03/04/2021  ?Expected End Date: 03/04/2022  ?Recent Progress: On track  ?Priority: High  ?Note:   ?Current Barriers:  ?Fluctuating BP, bowel spasms ? ?Pharmacist Clinical Goal(s):  ?Patient will verbalize ability to afford treatment regimen ?contact provider office for questions/concerns as evidenced notation of same in electronic health record through collaboration with PharmD and provider.  ? ?Interventions: ?1:1 collaboration with Allwardt, [name], PA-C regarding development and update of comprehensive plan of care as evidenced by provider attestation and co-signature ?Inter-disciplinary care team collaboration (see longitudinal plan of care) ?Comprehensive medication review performed; medication list updated in electronic medical record ?No medication changes ? ?Hypertension (BP goal <140/90) ?-Controlled ?-Current treatment: ?Amlodipine 10 mg daily Appropriate, Effective, Safe, Accessible ?-Denies issues with  potassium tolerability, able to take 20 meq tablets 3x/day without a problem.   ?-Current home readings: when symptomatic  ?-Current dietary habits: no specific diet. 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 Admits to not doing the best with hydradtion. ?-Denies hypotensive/hypertensive symptoms ?-Educated on BP goals and benefits of medications for prevention of heart attack, stroke and kidney damage; ?-Counseled to monitor BP at home, document, and provide log at future appointments ?-Recommended to continue current medication ?Counseled on staying hydrated especially during times of intense physical activity ? ?Update 09/07/21 ?Reports BP has been controlled, pulse has been "up and down." No specific logs at this time.  He denies any symptoms such as dizziness or HA's. ?He is now taking his chlorthalidone in the morning and he tolerates well. ?Encouraged to maintain hydration and continue medications adherence.  He continues to be very active around his house. ? ?Continue current meds at this time. ? ?Update 03/23/22 ?He reports having BP episodes that are very irregular.  Reports some systolic BP in the 284X where he is having episodes with what sounds like bowel spasms.  Other days his BP is completely normal.  BP has been normal in the office.  Saw GI and no clear explanation of why he is having these GI symptoms.  ER precautions discussed. ?He is going to monitor BP and record and we will call him in 2-3 weeks to check in. ? ?No changes at this time. ? ?Hyperlipidemia: (LDL goal < 70) ?-Not ideally controlled per 03/2021 labs  ?-Repatha start 01/2021 ?-Current treatment: ?Repatha 140 mg injection every 14 days (01/2021) ?-Medications previously tried: statin intolerant.  ?-Reports that everything is taken care of from a cost standpoint through lipid clinic. Will not be responsible for OOP spending due to approval of [employer] grant.  ?-Educated on  Cholesterol goals;  ?-Recommended to continue current medication ? ?Atrial Fibrillation (Goal: prevent stroke and major bleeding) ?-Controlled ?-Current treatment: ?Rate control: Metoprolol succinate 25 mg prn for HR > 120 ? Dofetilide 251mg Appropriate, Effective, Safe, Accessible ?Anticoagulation:  Eliquis 5 mg twice daily Appropriate, Effective, Safe, Accessible ?-Denies costs concerns with Eliquis, currently about $40 dollars/month. Not wanting to pursue patient assistance at this time ?-Home BP and HR readings: n/a  ?-Counseled on increased risk of stroke due to Afib and benefits of anticoagulation for stroke prevention; ?importance of adherence to anticoagulant exactly as prescribed; ?-Recommended to continue current medication, again emphasized adequate water intake. ? ?Update 09/07/21 ?Does mention cost burden with Eliquis at this time.  He has not previously applied for patient assistance and is interested at this time in an application.  I discussed the OOP spend with him and the requirements for approval.  Will have application sent to his house for completion. ?Denies abnormal bleeding/bruising.  He is requesting a new hematologist so will reach out to PCP for referral. ? ?Continue current meds ? ?Update 03/23/22 ?Recently started Tikosyn and doing well on it.  Reports he is very happy with how he feels since starting this medication.  Working on controlling BP.  HR appears to be controlled.  Denies any concern with cost at this time. ?No changes will continue to follow BP. ? ?Patient Goals/Self-Care Activities ?Patient will:  ?- take medications as prescribed ? ?[facility] ?Medication Assistance: None required.  Patient affirms current coverage meets needs. ? ?Patient's preferred pharmacy is: ? ?[facility] [id number] ?[id number]?Phone: [phone]: [phone]? ?Follow Up:  Patient  agrees to Care Plan and Follow-up. ?  ? ?  ?  ? ?The patient verbalized understanding of instructions, educational materials, and care plan provided today and declined offer to receive copy of patient instructions, educational materials, and care plan.  ?Telephone follow up appointment with pharmacy team member scheduled for: 6 months ? ?[facility]Edy[facility] Clarity [facility] ?CBeverly Milch PharmD ?Clinical Pharmacist  ?LOrvan July?([phone]? ?

## 2022-03-23 NOTE — Patient Instructions (Signed)
Good to see you today.  I think you have flared up your chronic back pain from weed-eating yesterday.  Thankfully I do not see any concerns on exam that indicate the need to go to the emergency department today. Please follow up with Dr. Arrie Eastern as scheduled at the end of this month and ask if pelvic floor PT might be of benefit to you.  I can help to coordinate this if needed. ?

## 2022-04-07 DIAGNOSIS — D869 Sarcoidosis, unspecified: Secondary | ICD-10-CM | POA: Diagnosis not present

## 2022-04-07 DIAGNOSIS — N1831 Chronic kidney disease, stage 3a: Secondary | ICD-10-CM | POA: Diagnosis not present

## 2022-04-07 DIAGNOSIS — E785 Hyperlipidemia, unspecified: Secondary | ICD-10-CM | POA: Diagnosis not present

## 2022-04-07 DIAGNOSIS — I4891 Unspecified atrial fibrillation: Secondary | ICD-10-CM | POA: Diagnosis not present

## 2022-04-07 DIAGNOSIS — I129 Hypertensive chronic kidney disease with stage 1 through stage 4 chronic kidney disease, or unspecified chronic kidney disease: Secondary | ICD-10-CM | POA: Diagnosis not present

## 2022-04-09 ENCOUNTER — Telehealth: Payer: Self-pay | Admitting: Pharmacist

## 2022-04-09 NOTE — Progress Notes (Signed)
Chronic Care Management Pharmacy Assistant   Name: Patrick Roth  MRN: [id number] DOB: 12-Jul-1946   Reason for Encounter: Hypertension Adherence Call    Recent office visits:  03/23/2022 [facility] (PCP) Roth, Patrick M, PA-C; no medication changes indicated.  Recent consult visits:  None  Hospital visits:  None in previous 6 months  Medications: Outpatient Encounter Medications as of 04/09/2022  Medication Sig   amLODipine (NORVASC) 10 MG tablet Take 10 mg by mouth daily.   apixaban (ELIQUIS) 5 MG TABS tablet Take 1 tablet by mouth twice daily   diltiazem (CARDIZEM CD) 180 MG 24 hr capsule Take 1 capsule (180 mg total) by mouth daily. (Patient not taking: Reported on 03/23/2022)   dofetilide (TIKOSYN) 250 MCG capsule Take 1 capsule (250 mcg total) by mouth 2 (two) times daily.   famotidine (PEPCID) 20 MG tablet Take 1 tablet (20 mg total) by mouth daily.   Magnesium Oxide 400 MG CAPS Take 1 capsule (400 mg total) by mouth 2 (two) times daily.   metoprolol tartrate (LOPRESSOR) 25 MG tablet Take 1 tablet (25 mg total) by mouth 2 (two) times daily as needed (FOR HEARTRATE HIGHER THAN 120 bpm).   oxyCODONE-acetaminophen (PERCOCET/ROXICET) 5-325 MG tablet Take 1 tablet by mouth every 8 (eight) hours as needed.   polyethylene glycol powder (GLYCOLAX/MIRALAX) 17 GM/SCOOP powder Take 17 g by mouth in the morning and at bedtime. Start with 1 scoop 2 times per day until bowels are moving, then reduce to 1 scoop daily. Drink 64oz of water daily.   potassium chloride SA (KLOR-CON M) 20 MEQ tablet Take 2 tablets (40 mEq total) by mouth 2 (two) times daily.   REPATHA SURECLICK 009 MG/ML SOAJ INJECT 1 PEN INTO THE SKIN EVERY 14 (FOURTEEN) DAYS   No facility-administered encounter medications on file as of 04/09/2022.   Reviewed chart prior to disease state call. Spoke with patient regarding BP  Recent Office Vitals: BP Readings from Last 3 Encounters:  03/23/22 (!) 143/82  03/11/22 134/80   03/03/22 128/89   Pulse Readings from Last 3 Encounters:  03/23/22 (!) 58  03/11/22 (!) 59  03/03/22 96    Wt Readings from Last 3 Encounters:  03/23/22 205 lb 9.6 oz (93.3 kg)  03/11/22 205 lb 9.6 oz (93.3 kg)  03/03/22 205 lb 9.6 oz (93.3 kg)     Kidney Function Lab Results  Component Value Date/Time   CREATININE 1.22 03/03/2022 01:07 PM   CREATININE 1.06 02/15/2022 11:47 AM   CREATININE 1.27 (H) 05/15/2020 09:54 AM   CREATININE 1.37 (H) 02/13/2020 09:24 AM   CREATININE 1.20 (H) 10/25/2016 11:40 AM   CREATININE 1.36 (H) 09/14/2016 10:19 AM   GFR 67.92 02/15/2022 11:47 AM   GFRNONAA >60 03/03/2022 01:07 PM   GFRNONAA 55 (L) 05/15/2020 09:54 AM   GFRAA 58 (L) 01/07/2021 10:01 AM   GFRAA >60 05/15/2020 09:54 AM       Latest Ref Rng & Units 03/03/2022    1:07 PM 02/15/2022   11:47 AM 12/25/2021   10:51 AM  BMP  Glucose 70 - 99 mg/dL 103   96   97    BUN 8 - 23 mg/dL '11   16   11    '$ Creatinine 0.61 - 1.24 mg/dL 1.22   1.06   1.12    Sodium 135 - 145 mmol/L 137   139   139    Potassium 3.5 - 5.1 mmol/L 4.2   3.9   3.8  Chloride 98 - 111 mmol/L 103   104   102    CO2 22 - 32 mmol/L '27   31   29    '$ Calcium 8.9 - 10.3 mg/dL 9.3   9.4   9.7      Current antihypertensive regimen:  Amlodipine 10 mg daily Metoprolol Tartrate 25 mg twice daily  How often are you checking your Blood Pressure? 1-2x per week  Current home BP readings: 140/80  What recent interventions/DTPs have been made by any provider to improve Blood Pressure control since last CPP Visit: No recent interventions or DTPs.  Any recent hospitalizations or ED visits since last visit with CPP? No  What diet changes have been made to improve Blood Pressure Control?  Patient states he eats a healthy diet.  What exercise is being done to improve your Blood Pressure Control?  Patient states he does not exercise much but he tries his best to stay active.  Adherence Review: Is the patient currently on ACE/ARB  medication? No Does the patient have >5 day gap between last estimated fill dates? No   Care Gaps: Medicare Annual Wellness: Completed 10/12/2021 Hemoglobin A1C: 5.8% on 01/17/2021 Colonoscopy: Completed 11/04/2020  Future Appointments  Date Time Provider [place]  08/12/2022  2:00 PM Patrick Fila, MD [facility] None  08/18/2022  9:30 AM Roth, Patrick Evens, PA-C [facility]  09/28/2022  3:30 PM [facility] CCM PHARMACIST [facility]  10/25/2022  1:45 PM [facility] HEALTH COACH [facility]   Star Rating Drugs: None  Patrick Roth, [place] Pharmacist Assistant [phone]

## 2022-04-14 ENCOUNTER — Emergency Department (HOSPITAL_BASED_OUTPATIENT_CLINIC_OR_DEPARTMENT_OTHER): Payer: Medicare HMO

## 2022-04-14 ENCOUNTER — Telehealth: Payer: Self-pay | Admitting: Cardiology

## 2022-04-14 ENCOUNTER — Encounter (HOSPITAL_BASED_OUTPATIENT_CLINIC_OR_DEPARTMENT_OTHER): Payer: Self-pay | Admitting: Emergency Medicine

## 2022-04-14 ENCOUNTER — Emergency Department (HOSPITAL_BASED_OUTPATIENT_CLINIC_OR_DEPARTMENT_OTHER)
Admission: EM | Admit: 2022-04-14 | Discharge: 2022-04-14 | Disposition: A | Discharge status: Home / Self Care | Payer: Medicare HMO | Attending: Emergency Medicine | Admitting: Emergency Medicine

## 2022-04-14 ENCOUNTER — Other Ambulatory Visit: Payer: Self-pay

## 2022-04-14 DIAGNOSIS — R109 Unspecified abdominal pain: Secondary | ICD-10-CM

## 2022-04-14 DIAGNOSIS — Z79899 Other long term (current) drug therapy: Secondary | ICD-10-CM | POA: Diagnosis not present

## 2022-04-14 DIAGNOSIS — R1030 Lower abdominal pain, unspecified: Secondary | ICD-10-CM | POA: Insufficient documentation

## 2022-04-14 DIAGNOSIS — Z7901 Long term (current) use of anticoagulants: Secondary | ICD-10-CM | POA: Diagnosis not present

## 2022-04-14 DIAGNOSIS — I10 Essential (primary) hypertension: Secondary | ICD-10-CM | POA: Insufficient documentation

## 2022-04-14 LAB — CBC WITH DIFFERENTIAL/PLATELET
Abs Immature Granulocytes: 0.02 10*3/uL (ref 0.00–0.07)
Basophils Absolute: 0 10*3/uL (ref 0.0–0.1)
Basophils Relative: 0 %
Eosinophils Absolute: 0.1 10*3/uL (ref 0.0–0.5)
Eosinophils Relative: 1 %
HCT: 48 % (ref 39.0–52.0)
Hemoglobin: 15.2 g/dL (ref 13.0–17.0)
Immature Granulocytes: 0 %
Lymphocytes Relative: 18 %
Lymphs Abs: 0.8 10*3/uL (ref 0.7–4.0)
MCH: 27.8 pg (ref 26.0–34.0)
MCHC: 31.7 g/dL (ref 30.0–36.0)
MCV: 87.9 fL (ref 80.0–100.0)
Monocytes Absolute: 0.7 10*3/uL (ref 0.1–1.0)
Monocytes Relative: 16 %
Neutro Abs: 2.9 10*3/uL (ref 1.7–7.7)
Neutrophils Relative %: 65 %
Platelets: 168 10*3/uL (ref 150–400)
RBC: 5.46 MIL/uL (ref 4.22–5.81)
RDW: 13.6 % (ref 11.5–15.5)
WBC: 4.6 10*3/uL (ref 4.0–10.5)
nRBC: 0 % (ref 0.0–0.2)

## 2022-04-14 LAB — URINALYSIS, ROUTINE W REFLEX MICROSCOPIC
Bilirubin Urine: NEGATIVE
Glucose, UA: NEGATIVE mg/dL
Hgb urine dipstick: NEGATIVE
Ketones, ur: NEGATIVE mg/dL
Leukocytes,Ua: NEGATIVE
Nitrite: NEGATIVE
Protein, ur: NEGATIVE mg/dL
Specific Gravity, Urine: 1.009 (ref 1.005–1.030)
pH: 6.5 (ref 5.0–8.0)

## 2022-04-14 LAB — COMPREHENSIVE METABOLIC PANEL
ALT: 11 U/L (ref 0–44)
AST: 17 U/L (ref 15–41)
Albumin: 4 g/dL (ref 3.5–5.0)
Alkaline Phosphatase: 78 U/L (ref 38–126)
Anion gap: 8 (ref 5–15)
BUN: 10 mg/dL (ref 8–23)
CO2: 27 mmol/L (ref 22–32)
Calcium: 9.3 mg/dL (ref 8.9–10.3)
Chloride: 102 mmol/L (ref 98–111)
Creatinine, Ser: 1.2 mg/dL (ref 0.61–1.24)
GFR, Estimated: >60 mL/min (ref >60)
Glucose, Bld: 95 mg/dL (ref 70–99)
Potassium: 4 mmol/L (ref 3.5–5.1)
Sodium: 137 mmol/L (ref 135–145)
Total Bilirubin: 1.3 mg/dL — ABNORMAL HIGH (ref 0.3–1.2)
Total Protein: 7.2 g/dL (ref 6.5–8.1)

## 2022-04-14 LAB — LIPASE, BLOOD: Lipase: 19 U/L (ref 11–51)

## 2022-04-14 MED ORDER — IOHEXOL 300 MG/ML  SOLN
100.0000 mL | Freq: Once | INTRAMUSCULAR | Status: AC | PRN
  Administered 2022-04-14: 100 mL via INTRAVENOUS

## 2022-04-14 MED ORDER — ACETAMINOPHEN 325 MG PO TABS
650.0000 mg | ORAL_TABLET | Freq: Once | ORAL | Status: AC
Start: 2022-04-14 — End: 2022-04-14
  Administered 2022-04-14: 650 mg via ORAL
  Filled 2022-04-14: qty 2

## 2022-04-14 NOTE — ED Provider Notes (Addendum)
[facility] EMERGENCY DEPT Provider Note   CSN: [id number] Arrival date & time: 04/14/22  1303     History  Chief Complaint  Patient presents with   Hypertension   Abdominal Pain    Patrick Roth is a 76 y.o. male.  [place] with complaint of lower abdominal discomfort described as a discomfort, appears nonspecific.  Also states he checked his blood pressure and it was 168/123 at home.  Denies any fevers or cough denies any vomiting or diarrhea.  He states he has had this abdominal discomfort from time to time but when he urinates he gets better but this time it did not get better with urination so he presented to the ER.      Home Medications Prior to Admission medications   Medication Sig Start Date End Date Taking? Authorizing Provider  amLODipine (NORVASC) 10 MG tablet Take 10 mg by mouth daily.    [provider]  apixaban (ELIQUIS) 5 MG TABS tablet Take 1 tablet by mouth twice daily 02/19/22   Camnitz, Ocie Doyne, MD  diltiazem (CARDIZEM CD) 180 MG 24 hr capsule Take 1 capsule (180 mg total) by mouth daily. Patient not taking: Reported on 03/23/2022 11/14/21   Baldwin Jamaica, PA-C  dofetilide (TIKOSYN) 250 MCG capsule Take 1 capsule (250 mcg total) by mouth 2 (two) times daily. 11/19/21   Sherran Needs, NP  famotidine (PEPCID) 20 MG tablet Take 1 tablet (20 mg total) by mouth daily. 11/01/21   Richardson Dopp T, PA-C  Magnesium Oxide 400 MG CAPS Take 1 capsule (400 mg total) by mouth 2 (two) times daily. 00/17/49   Delora Fuel, MD  metoprolol tartrate (LOPRESSOR) 25 MG tablet Take 1 tablet (25 mg total) by mouth 2 (two) times daily as needed (FOR HEARTRATE HIGHER THAN 120 bpm). 10/05/21   Baldwin Jamaica, PA-C  oxyCODONE-acetaminophen (PERCOCET/ROXICET) 5-325 MG tablet Take 1 tablet by mouth every 8 (eight) hours as needed. 03/18/22   [provider]  polyethylene glycol powder (GLYCOLAX/MIRALAX) 17 GM/SCOOP powder Take 17 g by mouth in the  morning and at bedtime. Start with 1 scoop 2 times per day until bowels are moving, then reduce to 1 scoop daily. Drink 64oz of water daily. 09/18/21   Jeanie Sewer, NP  potassium chloride SA (KLOR-CON M) 20 MEQ tablet Take 2 tablets (40 mEq total) by mouth 2 (two) times daily. 11/20/21   Sherran Needs, NP  REPATHA SURECLICK 449 MG/ML SOAJ INJECT 1 PEN INTO THE SKIN EVERY 14 (FOURTEEN) DAYS 01/07/22   Camnitz, Ocie Doyne, MD      Allergies    Crestor [rosuvastatin calcium], Spironolactone, Terazosin, Amiodarone, Atenolol, Bystolic [nebivolol hcl], Gabapentin, Hydralazine, Tizanidine, Clonidine, Hydrocodone-acetaminophen, Levofloxacin, Losartan, Maxzide [hydrochlorothiazide w-triamterene], Olmesartan, and Tussionex pennkinetic er [hydrocod poli-chlorphe poli er]    Review of Systems   Review of Systems  Constitutional:  Negative for fever.  HENT:  Negative for ear pain and sore throat.   Eyes:  Negative for pain.  Respiratory:  Negative for cough.   Cardiovascular:  Negative for chest pain.  Gastrointestinal:  Positive for abdominal pain.  Genitourinary:  Negative for flank pain.  Musculoskeletal:  Negative for back pain.  Skin:  Negative for color change and rash.  Neurological:  Negative for syncope.  All other systems reviewed and are negative.  Physical Exam Updated Vital Signs BP (!) 145/92 (BP Location: Left Arm)   Pulse 61   Temp 97.8 F (36.6 C)   Resp 18  Ht '5\' 8"'$  (1.727 m)   Wt 93 kg   SpO2 100%   BMI 31.17 kg/m  Physical Exam Constitutional:      Appearance: He is well-developed.  HENT:     Head: Normocephalic.     Nose: Nose normal.  Eyes:     Extraocular Movements: Extraocular movements intact.  Cardiovascular:     Rate and Rhythm: Normal rate.  Pulmonary:     Effort: Pulmonary effort is normal.  Abdominal:     Tenderness: There is abdominal tenderness. There is no guarding or rebound.     Comments: Has mild diffuse lower abdominal tenderness.  No  guarding or rebound noted.  Skin:    Coloration: Skin is not jaundiced.  Neurological:     Mental Status: He is alert. Mental status is at baseline.    ED Results / Procedures / Treatments   Labs (all labs ordered are listed, but only abnormal results are displayed) Labs Reviewed  COMPREHENSIVE METABOLIC PANEL - Abnormal; Notable for the following components:      Result Value   Total Bilirubin 1.3 (*)    All other components within normal limits  CBC WITH DIFFERENTIAL/PLATELET  LIPASE, BLOOD  URINALYSIS, ROUTINE W REFLEX MICROSCOPIC    EKG None  Radiology CT Abdomen Pelvis W Contrast  Result Date: 04/14/2022 CLINICAL DATA:  Abdominal pain, acute, nonlocalized non-focal lower abdominal pain EXAM: CT ABDOMEN AND PELVIS WITH CONTRAST TECHNIQUE: Multidetector CT imaging of the abdomen and pelvis was performed using the standard protocol following bolus administration of intravenous contrast. RADIATION DOSE REDUCTION: This exam was performed according to the departmental dose-optimization program which includes automated exposure control, adjustment of the mA and/or kV according to patient size and/or use of iterative reconstruction technique. CONTRAST:  129m OMNIPAQUE IOHEXOL 300 MG/ML  SOLN COMPARISON:  CT 12/24/2021 FINDINGS: Lower chest: No acute abnormality. Hepatobiliary: No focal liver abnormality is seen. The gallbladder is unremarkable. Pancreas: Unremarkable. No pancreatic ductal dilatation or surrounding inflammatory changes. Spleen: Normal in size without focal abnormality. Adrenals/Urinary Tract: Adrenal glands are unremarkable. No hydronephrosis or nephrolithiasis. The bladder is moderately distended. Stomach/Bowel: Tiny hiatal hernia. The stomach is otherwise within normal limits. There is no evidence of bowel obstruction.The appendix is normal. There is a linear calcific density within the hepatic flexure of the colon measuring 1.7 cm in length (series 2, image 27). There is no  adjacent stranding and no free intraperitoneal gas. Vascular/Lymphatic: Aortoiliac atherosclerosis. No AAA. No lymphadenopathy. Reproductive: Unchanged heterogeneously enlarged prostate gland protruding into the posterior bladder wall. Other: No abdominal wall hernia or abnormality. No abdominopelvic ascites. Musculoskeletal: No acute osseous abnormality. No suspicious lytic or blastic lesions. Multilevel degenerative changes of the spine. IMPRESSION: No acute findings in the abdomen or pelvis.  Normal appendix. Linear calcific density within the hepatic flexure of the colon measuring 1.7 cm in length, possibly an ingested bone. No evidence of complication (no wall thickening, adjacent inflammation, or evidence of perforation). Unchanged heterogeneous enlargement of the prostate gland. Moderately distended bladder. Electronically Signed   By: JMaurine SimmeringM.D.   On: 04/14/2022 15:16    Procedures Procedures    Medications Ordered in ED Medications  acetaminophen (TYLENOL) tablet 650 mg (650 mg Oral Given 04/14/22 1344)  iohexol (OMNIPAQUE) 300 MG/ML solution 100 mL (100 mLs Intravenous Contrast Given 04/14/22 1451)    ED Course/ Medical Decision Making/ A&P  Medical Decision Making Amount and/or Complexity of Data Reviewed Labs: ordered. Radiology: ordered.  Risk OTC drugs. Prescription drug management.   Labs sent, white count normal chemistry normal urinalysis negative.  Cardiac monitoring shows sinus rhythm.  CT abdomen pelvis pursued.  No acute findings per radiology.  However there was a linear opacity concerning for may be a swallowed bone or other object.  This appears in the colon.  Patient exam is otherwise continues to be benign.  Recommending outpatient follow-up with gastroenterology in 2-3 days.  Recommend immediate return if he has fevers worsening symptoms or any additional concerns.    Final Clinical Impression(s) / ED Diagnoses Final  diagnoses:  Abdominal pain, unspecified abdominal location    Rx / DC Orders ED Discharge Orders     None         Luna Fuse, MD 04/14/22 1455    Luna Fuse, MD 04/14/22 1524

## 2022-04-14 NOTE — ED Triage Notes (Signed)
Pt arrived POV c/o high blood pressure (168/123 at home). Also endorses intermittent lower abdominal pain started today, takes Miralax and acid pill. Denies n/v/d/constipation, no fever. Last BM today.

## 2022-04-14 NOTE — Telephone Encounter (Signed)
Pt c/o BP issue: STAT if pt c/o blurred vision, one-sided weakness or slurred speech  1. What are your last 5 BP readings? 160/88 HR 123 today      180/98 HR95 today      136/70 HR 70 Monday, 5/22           2. Are you having any other symptoms (ex. Dizziness, headache, blurred vision, passed out)? Feels uncomfortable in the stomach and is urinating more.   3. What is your BP issue? BP was high this morning

## 2022-04-14 NOTE — Discharge Instructions (Addendum)
Your labs are unremarkable, your CT scan showed a possible bone fragment in your colon.  Call your GI doctor to arrange follow-up with gastroenterology at [facility] to this week for this finding.  Return back to the ER if you have worsening pain fevers or any additional concerns.

## 2022-04-15 NOTE — Telephone Encounter (Signed)
Pt reports he went to ED yesterday. "There is some sort of bone or something in my stomach and they think that is why my blood pressure is up". Pt reports he has left a message with his GI MD for appt/evaluation. He appreciates the follow up call and understands if further BP issues he should contact his PCP.

## 2022-04-16 ENCOUNTER — Telehealth: Payer: Self-pay | Admitting: Physician Assistant

## 2022-04-16 ENCOUNTER — Other Ambulatory Visit: Payer: Self-pay | Admitting: Physician Assistant

## 2022-04-16 ENCOUNTER — Telehealth: Payer: Self-pay

## 2022-04-16 ENCOUNTER — Ambulatory Visit (INDEPENDENT_AMBULATORY_CARE_PROVIDER_SITE_OTHER): Payer: Medicare HMO | Admitting: Physician Assistant

## 2022-04-16 ENCOUNTER — Encounter: Payer: Self-pay | Admitting: Physician Assistant

## 2022-04-16 VITALS — BP 126/80 | HR 65 | Temp 96.8°F | Ht 68.0 in | Wt 202.4 lb

## 2022-04-16 DIAGNOSIS — R935 Abnormal findings on diagnostic imaging of other abdominal regions, including retroperitoneum: Secondary | ICD-10-CM | POA: Diagnosis not present

## 2022-04-16 DIAGNOSIS — Z8673 Personal history of transient ischemic attack (TIA), and cerebral infarction without residual deficits: Secondary | ICD-10-CM

## 2022-04-16 DIAGNOSIS — M5416 Radiculopathy, lumbar region: Secondary | ICD-10-CM

## 2022-04-16 DIAGNOSIS — K5902 Outlet dysfunction constipation: Secondary | ICD-10-CM

## 2022-04-16 NOTE — Progress Notes (Signed)
Subjective:    Patient ID: Patrick Roth, male    DOB: [birth year], 76 y.o.   MRN: [id number]  Chief Complaint  Patient presents with   Follow-up   Referral    HPI Patient is in today for ED f/up from 04/14/22.  -Pt went to [facility] for abdominal discomfort, elevated bp of 168/123 at home.  -UA and CBC, CMP, Lipase all negative / WNL  -CT abd/pelvis w/contrast:   IMPRESSION: No acute findings in the abdomen or pelvis.  Normal appendix.   Linear calcific density within the hepatic flexure of the colon measuring 1.7 cm in length, possibly an ingested bone. No evidence of complication (no wall thickening, adjacent inflammation, or evidence of perforation).   Unchanged heterogeneous enlargement of the prostate gland. Moderately distended bladder.   Pt told to f/up with GI in 2-3 days.   TODAY: Says his teeth are chattering, starts to get a clogged feeling in his throat, then sweating / R hip, bilateral leg pain, then has a bowel movement and all this resolves. - This is not unusual for him, has complained of this ongoing & has seen GI / back specialist for this issue "Uncomfortable in his belly" right now, but not pain like at the ED Unsure about this foreign body in his colon on CT - says he doesn't eat fish; hasn't had bone-in chicken; has had pork recently Says his GI doctor told him to f/up with a neurologist about getting on something for vagal nerve   Past Medical History:  Diagnosis Date   Atrial fibrillation/flutter    Intol of amio, beta blocker // Echo 12/22: inf HK, EF 50-55, normal RVSF, mild LaE, mild MR, mod MAC, AV sclerosis w/o aS   Colon polyps    Constipation 05/27/2015   DOE (dyspnea on exertion) 10/13/2015   Faintness 06/20/2016   History of chicken pox    Hyperlipidemia    Hypertension    Internal hemorrhoids    Polycythemia    Sciatica    Stroke ([place])    Hx of stroke in2008; recurrent CVA in 12/2020   Syncope and collapse 06/28/2016     Past Surgical History:  Procedure Laterality Date   CARDIOVERSION N/A 09/02/2017   Procedure: CARDIOVERSION;  Surgeon: [name], MD;  Location: [facility] ENDOSCOPY;  Service: Cardiovascular;  Laterality: N/A;   COLONOSCOPY  2011   great toe surgery     PROSTATE BIOPSY      Family History  Problem Relation Age of Onset   Hypertension Father 30       Deceased   Heart disease Father    Lung cancer Mother 28       Deceased   Healthy Sister        x3   Healthy Brother        x4   Heart disease Brother        #5   Other Daughter        Alpha Thalassemia   Lupus Daughter        #2-deceased   Diabetes Neg Hx    Heart attack Neg Hx    Hyperlipidemia Neg Hx    Sudden death Neg Hx     Social History   Tobacco Use   Smoking status: Never   Smokeless tobacco: Never   Tobacco comments:    Never smoke 03/11/22  Vaping Use   Vaping Use: Never used  Substance Use Topics   Alcohol use: No  Drug use: No     Allergies  Allergen Reactions   Crestor [Rosuvastatin Calcium] Shortness Of Breath    Muscle cramps   Spironolactone Shortness Of Breath   Terazosin Shortness Of Breath, Palpitations and Other (See Comments)    Nerve pain, also   Amiodarone Other (See Comments)    Dropped pulse real low to 45-50, and made him feel very cold   Atenolol Other (See Comments)    Drowsiness and "flu sxs"   Bystolic [Nebivolol Hcl] Other (See Comments)    GI issues   Gabapentin Other (See Comments)    "Did not help with pain"   Hydralazine Other (See Comments)    Joint swelling   Tizanidine Other (See Comments)    Syncope, Elevated BP/pulse   Clonidine Other (See Comments)    Unknown reaction   Hydrocodone-Acetaminophen Other (See Comments)    Unknown reaction   Levofloxacin Other (See Comments)    Unknown reaction   Losartan Other (See Comments)    Did not work.  Pt states he couldn't take b/c of SE, but can't remember what SE were.   Maxzide [Hydrochlorothiazide  W-Triamterene] Other (See Comments)    Does not tolerate potassium-sparing diuretics   Olmesartan Other (See Comments)    Unknown reaction   Tussionex Pennkinetic Er [Hydrocod Poli-Chlorphe Poli Er] Other (See Comments)    Unknown reaction    Review of Systems NEGATIVE UNLESS OTHERWISE INDICATED IN HPI      Objective:     BP 126/80 (BP Location: Left Arm, Patient Position: Sitting, Cuff Size: Normal)   Pulse 65   Temp (!) 96.8 F (36 C) (Oral)   Ht _0  (1.727 m)   Wt 202 lb 6.4 oz (91.8 kg)   SpO2 99%   BMI 30.77 kg/m   Wt Readings from Last 3 Encounters:  04/16/22 202 lb 6.4 oz (91.8 kg)  04/14/22 205 lb (93 kg)  03/23/22 205 lb 9.6 oz (93.3 kg)    BP Readings from Last 3 Encounters:  04/16/22 126/80  04/14/22 139/83  03/23/22 (!) 143/82     Physical Exam Vitals and nursing note reviewed.  Constitutional:      General: He is not in acute distress.    Appearance: Normal appearance. He is not toxic-appearing.  HENT:     Head: Normocephalic and atraumatic.     Right Ear: Ear canal and external ear normal.     Left Ear: Ear canal and external ear normal.     Nose: Nose normal.     Mouth/Throat:     Mouth: Mucous membranes are moist.     Pharynx: Oropharynx is clear.  Eyes:     Extraocular Movements: Extraocular movements intact.     Conjunctiva/sclera: Conjunctivae normal.     Pupils: Pupils are equal, round, and reactive to light.  Cardiovascular:     Rate and Rhythm: Normal rate. Rhythm irregular.     Pulses: Normal pulses.     Heart sounds: Normal heart sounds.  Pulmonary:     Effort: Pulmonary effort is normal.     Breath sounds: Normal breath sounds.  Abdominal:     General: Abdomen is flat.     Tenderness: There is no abdominal tenderness. There is no rebound.  Musculoskeletal:     Cervical back: Normal range of motion and neck supple.  Skin:    General: Skin is warm and dry.  Neurological:     General: No focal deficit present.  Mental  Status: He is alert and oriented to person, place, and time.     Cranial Nerves: No cranial nerve deficit.     Motor: No weakness.  Psychiatric:        Mood and Affect: Mood normal.        Behavior: Behavior normal.       Assessment & Plan:   Problem List Items Addressed This Visit       Other   Dyssynergic defecation   Other Visit Diagnoses     Abnormal CT of the abdomen    -  Primary       1. Abnormal CT of the abdomen 2. Dyssynergic defecation I personally reviewed patient's most recent ED report, labs, and CT imaging. I reached out to Dr. Shary Key office - he was out of office today, receptionist reported: -01/27/22 - first and only time he has been seen with [facility] [place], [place] -Sigmoidoscopy on 03/15/22 [facility] I then contacted Dr. Mary Sella on-call today, who also reviewed patient's CT. States nothing else to do at this time except monitor. F/up outpatient with their office. Back to ED if sudden worsening pain or symptoms.  -I think patient is best served to follow-up with their office and then schedule neuro appt from there if necessary.   This note was prepared with assistance of Systems analyst. Occasional wrong-word or sound-a-like substitutions may have occurred due to the inherent limitations of voice recognition software.  Time Spent: 34 minutes of total time was spent on the date of the encounter performing the following actions: chart review prior to seeing the patient, obtaining history, performing a medically necessary exam, counseling on the treatment plan, placing orders, and documenting in our EHR.       Xeng Kucher M Ezrah Panning, PA-C

## 2022-04-16 NOTE — Telephone Encounter (Signed)
Ok to place referral.

## 2022-04-16 NOTE — Telephone Encounter (Signed)
Please see note and advise  

## 2022-04-16 NOTE — Telephone Encounter (Signed)
Patient has called in stating that he was informed to call his neurologist to schedule an appointment after his office visit with [facility] this morning.  Patient states he reach out to [facility].  He was informed that since this is a new concern, our office would have to send over a referral.  Patient is requesting referral to be placed.

## 2022-04-16 NOTE — Telephone Encounter (Signed)
LVM for pt and advised information with office cb number

## 2022-04-16 NOTE — Telephone Encounter (Signed)
Pt states he was told by [facility] that he needs to see Neuro first. Neuro can't get him in until Sept unless [facility] sends a STAT referral for him to be seen sooner. Please advise

## 2022-04-16 NOTE — Patient Instructions (Signed)
Good to see you today.  Please contact [facility] at [phone] to schedule a follow-up appointment outpatient next week about your CT of abdomen.  I talked with Dr. Mary Sella on-call today and this is what he advised.  Back to ED if sudden severe pain or symptoms.  I would follow-up with GI first and then follow-up with neurology if need be after that appointment.  Please keep me posted on how you are doing.

## 2022-04-20 DIAGNOSIS — M5136 Other intervertebral disc degeneration, lumbar region: Secondary | ICD-10-CM | POA: Diagnosis not present

## 2022-04-20 DIAGNOSIS — M47816 Spondylosis without myelopathy or radiculopathy, lumbar region: Secondary | ICD-10-CM | POA: Diagnosis not present

## 2022-04-20 DIAGNOSIS — M5416 Radiculopathy, lumbar region: Secondary | ICD-10-CM | POA: Diagnosis not present

## 2022-04-20 DIAGNOSIS — M5412 Radiculopathy, cervical region: Secondary | ICD-10-CM | POA: Diagnosis not present

## 2022-04-21 DIAGNOSIS — I4891 Unspecified atrial fibrillation: Secondary | ICD-10-CM

## 2022-04-21 DIAGNOSIS — E785 Hyperlipidemia, unspecified: Secondary | ICD-10-CM

## 2022-04-21 DIAGNOSIS — I10 Essential (primary) hypertension: Secondary | ICD-10-CM

## 2022-05-03 ENCOUNTER — Ambulatory Visit (INDEPENDENT_AMBULATORY_CARE_PROVIDER_SITE_OTHER): Payer: Medicare HMO | Admitting: Neurology

## 2022-05-03 ENCOUNTER — Encounter: Payer: Self-pay | Admitting: Neurology

## 2022-05-03 VITALS — BP 145/91 | HR 98 | Ht 68.6 in | Wt 204.0 lb

## 2022-05-03 DIAGNOSIS — R760 Raised antibody titer: Secondary | ICD-10-CM | POA: Diagnosis not present

## 2022-05-03 DIAGNOSIS — R202 Paresthesia of skin: Secondary | ICD-10-CM | POA: Diagnosis not present

## 2022-05-03 DIAGNOSIS — R7989 Other specified abnormal findings of blood chemistry: Secondary | ICD-10-CM | POA: Diagnosis not present

## 2022-05-03 DIAGNOSIS — G629 Polyneuropathy, unspecified: Secondary | ICD-10-CM

## 2022-05-03 DIAGNOSIS — R700 Elevated erythrocyte sedimentation rate: Secondary | ICD-10-CM | POA: Diagnosis not present

## 2022-05-03 DIAGNOSIS — E538 Deficiency of other specified B group vitamins: Secondary | ICD-10-CM | POA: Diagnosis not present

## 2022-05-03 DIAGNOSIS — R799 Abnormal finding of blood chemistry, unspecified: Secondary | ICD-10-CM | POA: Diagnosis not present

## 2022-05-03 DIAGNOSIS — Z8673 Personal history of transient ischemic attack (TIA), and cerebral infarction without residual deficits: Secondary | ICD-10-CM

## 2022-05-03 NOTE — Patient Instructions (Signed)
I had a long discussion with the patient regarding his new complaints of lower extremity paresthesias which interestingly seem to be triggered by defecation process and do suggest neuropathy but it is difficult to localize.  I recommend he continue Lyrica 50 mg 3 times daily which seems to have significantly helped his symptoms.  Check EMG nerve conduction study and neuropathy panel labs.  Continue Eliquis for stroke prevention and maintain aggressive risk factor modification with strict control of hypertension with blood pressure goal below 140/90, lipids with LDL cholesterol goal below 70 mg percent and diabetes with hemoglobin A1c goal below 6.5%.  He will return for follow-up in the future in 3 months or call earlier if necessary.

## 2022-05-03 NOTE — Progress Notes (Signed)
[facility] [address] [place]. [place] [address] [phone]       OFFICE FOLLOW UP VISIT NOTE  Mr. Patrick Roth Date of Birth:  December 02, 1945 Medical Record Number:  [id number]   Referring MD: Barb Merino Reason for Referral: Stroke  HPI: Initial visit 03/25/2021 :Patrick Roth is a 76 year old pleasant African-American male seen today for initial office consultation visit for stroke.  History is obtained from the patient and his wife and review of electronic medical records and I personally reviewed available pertinent imaging films in PACS.  He has past medical history history significant for hypertension, hyperlipidemia, atrial flutter on Eliquis, prior stroke with mild residual right-sided weakness, sciatica.  He presented on 01/16/2021 with sudden onset of numbness and right leg from the knee down as well as right arm and face.  Started improving with days sought medical help and evaluation he was found to have any only minimum numbness and was not given tPA CT head was unremarkable.  MRI scan was obtained which showed left frontoparietal cortical embolic infarct.  CT angiogram of the brain and neck showed only 30% extracranial right carotid stenosis no significant intracranial stenosis.  Echocardiogram showed ejection fraction of 55% without cardiac source of embolism.  He had previously had a 7-day Holter monitor in 09/29/2020 which had shown atrial fibrillation and flutter.  LDL cholesterol was 201 mg percent and hemoglobin A1c was 5.8.  Patient was also found to have polycythemia and hematocrit was 57.3 on 02/03/2021.  Lab test for JA K2 mutation was negative.  Patient does have history of sarcoidosis and has seen hematologist Dr. 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  His atrial fibrillation was diagnosed 4 years ago and he was initially on Xarelto but for unclear reason has since been switched to Eliquis.   His hyperlipidemia has not responded to statins and he has switched to [place] which he started after his recent stroke and is tolerating it well without any injection site reactions.  Patient states his numbness is mostly resolved resolved but he does have some tiredness which has persisted. Update 08/12/2021 : He returns for follow-up after last visit 4 months ago.  He is accompanied by his wife.  He is doing well has not had any recurrent stroke or TIA symptoms.  He remains on Eliquis which is tolerating well without bleeding or bruising.  Blood his blood pressure is under good control today it is 122/80.  He is tolerating Repatha well and follow-up lipid profile on 03/25/1969 showed LDL cholesterol to be 70 mg percent.  He has tapered and discontinued gabapentin as instructed.  Patient has switched taking chlorthalidone to daytime as it made him quite tired at night.  He has been having back pain and has received 2 shots in his back for arthritic pain by pain medicine doctor in [facility].  He has no new complaints today. Update 05/03/2022 : He returns for follow-up today after last visit 9 months ago.  Patient states that he has had several episodes of feeling cold in his legs with numbness sensation which has been triggered when he defecates.  He states this has been going on for quite some time.  At times he is even gets a burning sensation in his toes cramp up.  He underwent sigmoidoscopy on 03/15/2022 for evaluation for this which was unremarkable.  He was started on Lyrica 50 mg 3 times daily 2 weeks ago which seems  to have helped tremendously in these episodes much improved though he still gets some cold sensation in his legs intermittently.  He is also been complaining of shoulder pain and myalgias.  Remains on Eliquis which is tolerating well without bruising or bleeding.  His blood pressures under good control usually though today it is slightly elevated in office 145/91.  He has not had any recurrent  stroke or TIA symptoms.  He continues to tolerate Repatha well without side effects ROS:   14 system review of systems is positive for numbness, cold sensation, shoulder pain, shortness of breath, bruising and all other systems negative  PMH:  Past Medical History:  Diagnosis Date   Atrial fibrillation/flutter    Intol of amio, beta blocker // Echo 12/22: inf HK, EF 50-55, normal RVSF, mild LaE, mild MR, mod MAC, AV sclerosis w/o aS   Colon polyps    Constipation 05/27/2015   DOE (dyspnea on exertion) 10/13/2015   Faintness 06/20/2016   History of chicken pox    Hyperlipidemia    Hypertension    Internal hemorrhoids    Polycythemia    Sciatica    Stroke ([place])    Hx of stroke in2008; recurrent CVA in 12/2020   Syncope and collapse 06/28/2016    Social History:  Social History   Socioeconomic History   Marital status: Married    Spouse name: Patrick Roth   Number of children: 1   Years of education: 12   Highest education level: Not on file  Occupational History   Occupation: Retired  Tobacco Use   Smoking status: Never   Smokeless tobacco: Never   Tobacco comments:    Never smoke 03/11/22  Vaping Use   Vaping Use: Never used  Substance and Sexual Activity   Alcohol use: No   Drug use: No   Sexual activity: Not on file  Other Topics Concern   Not on file  Social History Narrative   Lives with wife   Right Handed   Drinks no caffeine   Social Determinants of Health   Financial Resource Strain: Low Risk  (10/12/2021)   Overall Financial Resource Strain (CARDIA)    Difficulty of Paying Living Expenses: Not hard at all  Food Insecurity: No Food Insecurity (10/12/2021)   Hunger Vital Sign    Worried About Running Out of Food in the Last Year: Never true    Ran Out of Food in the Last Year: Never true  Transportation Needs: No Transportation Needs (10/12/2021)   PRAPARE - Hydrologist (Medical): No    Lack of Transportation (Non-Medical):  No  Physical Activity: Inactive (10/12/2021)   Exercise Vital Sign    Days of Exercise per Week: 0 days    Minutes of Exercise per Session: 0 min  Stress: No Stress Concern Present (10/12/2021)   [place]    Feeling of Stress : Not at all  Social Connections: Moderately Isolated (10/12/2021)   Social Connection and Isolation Panel [NHANES]    Frequency of Communication with Friends and Family: More than three times a week    Frequency of Social Gatherings with Friends and Family: More than three times a week    Attends Religious Services: Never    Marine scientist or Organizations: No    Attends Archivist Meetings: Never    Marital Status: Married  Human resources officer Violence: Not At Risk (10/12/2021)   Humiliation, Afraid, Rape,  and Kick questionnaire    Fear of Current or Ex-Partner: No    Emotionally Abused: No    Physically Abused: No    Sexually Abused: No    Medications:   Current Outpatient Medications on File Prior to Visit  Medication Sig Dispense Refill   amLODipine (NORVASC) 10 MG tablet Take 10 mg by mouth daily.     apixaban (ELIQUIS) 5 MG TABS tablet Take 1 tablet by mouth twice daily 60 tablet 10   dofetilide (TIKOSYN) 250 MCG capsule Take 1 capsule (250 mcg total) by mouth 2 (two) times daily. 60 capsule 5   oxyCODONE-acetaminophen (PERCOCET/ROXICET) 5-325 MG tablet Take 1 tablet by mouth every 8 (eight) hours as needed.     polyethylene glycol powder (GLYCOLAX/MIRALAX) 17 GM/SCOOP powder Take 17 g by mouth in the morning and at bedtime. Start with 1 scoop 2 times per day until bowels are moving, then reduce to 1 scoop daily. Drink 64oz of water daily. 3350 g 1   pregabalin (LYRICA) 50 MG capsule Take 50 mg by mouth 3 (three) times daily.     REPATHA SURECLICK 161 MG/ML SOAJ INJECT 1 PEN INTO THE SKIN EVERY 14 (FOURTEEN) DAYS 6 mL 3   No current facility-administered medications on  file prior to visit.    Allergies:   Allergies  Allergen Reactions   Crestor [Rosuvastatin Calcium] Shortness Of Breath    Muscle cramps   Spironolactone Shortness Of Breath   Terazosin Shortness Of Breath, Palpitations and Other (See Comments)    Nerve pain, also   Amiodarone Other (See Comments)    Dropped pulse real low to 45-50, and made him feel very cold   Atenolol Other (See Comments)    Drowsiness and "flu sxs"   Bystolic [Nebivolol Hcl] Other (See Comments)    GI issues   Gabapentin Other (See Comments)    "Did not help with pain"   Hydralazine Other (See Comments)    Joint swelling   Tizanidine Other (See Comments)    Syncope, Elevated BP/pulse   Clonidine Other (See Comments)    Unknown reaction   Hydrocodone-Acetaminophen Other (See Comments)    Unknown reaction   Levofloxacin Other (See Comments)    Unknown reaction   Losartan Other (See Comments)    Did not work.  Pt states he couldn't take b/c of SE, but can't remember what SE were.   Maxzide [Hydrochlorothiazide W-Triamterene] Other (See Comments)    Does not tolerate potassium-sparing diuretics   Olmesartan Other (See Comments)    Unknown reaction   Tussionex Pennkinetic Er [Hydrocod Poli-Chlorphe Poli Er] Other (See Comments)    Unknown reaction    Physical Exam General: well developed, well nourished elderly African-American male, seated, in no evident distress Head: head normocephalic and atraumatic.   Neck: supple with no carotid or supraclavicular bruits Cardiovascular: regular rate and rhythm, no murmurs Musculoskeletal: no deformity Skin:  no rash/petichiae Vascular:  Normal pulses all extremities  Neurologic Exam Mental Status: Awake and fully alert. Oriented to place and time. Recent and remote memory intact. Attention span, concentration and fund of knowledge appropriate. Mood and affect appropriate.  Cranial Nerves: Fundoscopic exam not done s. Pupils equal, briskly reactive to light.  Extraocular movements full without nystagmus. Visual fields full to confrontation. Hearing intact. Facial sensation intact. Face, tongue, palate moves normally and symmetrically.  Motor: Normal bulk and tone. Normal strength in all tested extremity muscles. Sensory.: intact to touch , pinprick , position and vibratory sensation except  patchy hypoesthesia in right corner of the mouth and right hand.  Diminished vibration over ankle down bilaterally.  Romberg sign is negative Coordination: Rapid alternating movements normal in all extremities. Finger-to-nose and heel-to-shin performed accurately bilaterally. Gait and Station: Arises from chair without difficulty. Stance is normal. Gait demonstrates normal stride length and balance . Able to heel, toe and tandem walk with moderate difficulty.  Reflexes: 1+ and symmetric except knee jerks and ankle jerks are depressed.. Toes downgoing.       ASSESSMENT: 76 year old African-American male with left frontal MCA branch embolic infarcts secondary to atrial fibrillation and polycythemia in February 2022.  New complaints of longstanding lower extremity paresthesias and cold sensation triggered by defecation of unclear etiology possibly hypersensitivity  firing of visceral nerves l nerves triggered by bowel movement doubt underlying peripheral neuropathy.      PLAN: I had a long discussion with the patient regarding his new complaints of lower extremity paresthesias which interestingly seem to be triggered by defecation process and do suggest neuropathy but it is difficult to localize.  I recommend he continue Lyrica 50 mg 3 times daily which seems to have significantly helped his symptoms.  Check EMG nerve conduction study and neuropathy panel labs.  Continue Eliquis for stroke prevention and maintain aggressive risk factor modification with strict control of hypertension with blood pressure goal below 140/90, lipids with LDL cholesterol goal below 70 mg  percent and diabetes with hemoglobin A1c goal below 6.5%.  He will return for follow-up in the future in 3 months or call earlier if necessary. Greater than 50% time during this 50-minute prolonged visit was spent on counseling and coordination of care about his embolic stroke, atrial fibrillation, polycythemia and new complaints of numbness and cold sensation in the legs and answering questions.  Antony Contras MD  Note: This document was prepared with digital dictation and possible smart phrase technology. Any transcriptional errors that result from this process are unintentional.

## 2022-05-05 ENCOUNTER — Telehealth: Payer: Self-pay | Admitting: Neurology

## 2022-05-05 LAB — NEUROPATHY PANEL
A/G Ratio: 1.4 (ref 0.7–1.7)
Albumin ELP: 4 g/dL (ref 2.9–4.4)
Alpha 1: 0.2 g/dL (ref 0.0–0.4)
Alpha 2: 0.7 g/dL (ref 0.4–1.0)
Angio Convert Enzyme: 87 U/L — ABNORMAL HIGH (ref 14–82)
Anti Nuclear Antibody (ANA): NEGATIVE
Beta: 0.8 g/dL (ref 0.7–1.3)
Gamma Globulin: 1.3 g/dL (ref 0.4–1.8)
Globulin, Total: 2.9 g/dL (ref 2.2–3.9)
Rheumatoid fact SerPl-aCnc: 43.2 IU/mL — ABNORMAL HIGH (ref <14.0)
Sed Rate: 12 mm/hr (ref 0–30)
TSH: 1.47 u[IU]/mL (ref 0.450–4.500)
Total Protein: 6.9 g/dL (ref 6.0–8.5)
Vit D, 25-Hydroxy: 17.4 ng/mL — ABNORMAL LOW (ref 30.0–100.0)
Vitamin B-12: 576 pg/mL (ref 232–1245)

## 2022-05-05 NOTE — Telephone Encounter (Signed)
Order for NCV/EMG sent to [facility] [phone]. 

## 2022-05-09 NOTE — Progress Notes (Signed)
Kindly advise the patient that lab work for reversible causes of neuropathy was significant for quite low vitamin D stimulating levels and abnormal blood tests for rheumatoid factor.  Asked the patient to see primary care physician to replace vitamin D and referred to rheumatology.

## 2022-05-10 ENCOUNTER — Telehealth: Payer: Self-pay | Admitting: *Deleted

## 2022-05-10 ENCOUNTER — Other Ambulatory Visit: Payer: Self-pay

## 2022-05-10 DIAGNOSIS — E559 Vitamin D deficiency, unspecified: Secondary | ICD-10-CM

## 2022-05-10 MED ORDER — VITAMIN D (ERGOCALCIFEROL) 1.25 MG (50000 UNIT) PO CAPS: 50000.0000 [IU] | ORAL_CAPSULE | ORAL | 0 refills | Status: AC

## 2022-05-10 NOTE — Telephone Encounter (Signed)
I spoke to the patient and provided him with the lab results. He is in agreement to follow up with his PCP for further evaluation.

## 2022-05-10 NOTE — Telephone Encounter (Signed)
-----   Message from Garvin Fila, MD sent at 05/09/2022 10:34 AM EDT ----- Mitchell Heir advise the patient that lab work for reversible causes of neuropathy was significant for quite low vitamin D stimulating levels and abnormal blood tests for rheumatoid factor.  Asked the patient to see primary care physician to replace vitamin D and referred to rheumatology.

## 2022-05-12 ENCOUNTER — Ambulatory Visit (INDEPENDENT_AMBULATORY_CARE_PROVIDER_SITE_OTHER): Payer: Medicare HMO | Admitting: Physician Assistant

## 2022-05-12 ENCOUNTER — Encounter: Payer: Self-pay | Admitting: Physician Assistant

## 2022-05-12 VITALS — BP 142/98 | HR 92 | Temp 97.9°F | Ht 68.6 in | Wt 208.4 lb

## 2022-05-12 DIAGNOSIS — R935 Abnormal findings on diagnostic imaging of other abdominal regions, including retroperitoneum: Secondary | ICD-10-CM

## 2022-05-12 DIAGNOSIS — M5416 Radiculopathy, lumbar region: Secondary | ICD-10-CM | POA: Diagnosis not present

## 2022-05-12 DIAGNOSIS — K5902 Outlet dysfunction constipation: Secondary | ICD-10-CM

## 2022-05-12 DIAGNOSIS — E559 Vitamin D deficiency, unspecified: Secondary | ICD-10-CM

## 2022-05-12 DIAGNOSIS — K5903 Drug induced constipation: Secondary | ICD-10-CM | POA: Diagnosis not present

## 2022-05-12 NOTE — Patient Instructions (Signed)
Glad to see you today doing so well! I'm glad the Lyrica is helping. Continue to take Miralax as needed. Keep stools soft and regular.   Take Vit D once weekly as directed.   Please contact [facility] specialists at [phone] to schedule a follow-up appointment outpatient about your CT of abdomen.  I talked with Dr. Mary Sella on-call today and this is what he advised.

## 2022-05-12 NOTE — Progress Notes (Signed)
Subjective:    Patient ID: Patrick Roth, male    DOB: Jul 10, 1946, 76 y.o.   MRN: [id number]  Chief Complaint  Patient presents with   medication Discussion    Pt coming into discuss medications and new treatment for vitamin D, appt scheduled with specialist for July.     HPI Patient is in today for recheck since recent visit with Dr. Leonie Man on 05/03/22, whose portion of plan note is provided here: "I had a long discussion with the patient regarding his new complaints of lower extremity paresthesias which interestingly seem to be triggered by defecation process and do suggest neuropathy but it is difficult to localize.  I recommend he continue Lyrica 50 mg 3 times daily which seems to have significantly helped his symptoms.  Check EMG nerve conduction study and neuropathy panel labs."  -Following Plan, neuropathy panel showed quite low Vit D levels and abnormal tests for rheumatoid factor  Says the medicine (Lyrica) his pain doctor gave him - "worked immediately". Feels like a heating pad on his legs.  He had to reduce Miralax dosing because things were too loose. If stool is a bit firmer, has more relief of symptoms.   He is still concerned about "fishbone" that was noted on his CT on 04/14/22. He did not follow up with Dr. Astrid Drafts office as was recommended at my last visit with him.   Past Medical History:  Diagnosis Date   Atrial fibrillation/flutter    Intol of amio, beta blocker // Echo 12/22: inf HK, EF 50-55, normal RVSF, mild LaE, mild MR, mod MAC, AV sclerosis w/o aS   Colon polyps    Constipation 05/27/2015   DOE (dyspnea on exertion) 10/13/2015   Faintness 06/20/2016   History of chicken pox    Hyperlipidemia    Hypertension    Internal hemorrhoids    Polycythemia    Sciatica    Stroke ([place])    Hx of stroke in2008; recurrent CVA in 12/2020   Syncope and collapse 06/28/2016    Past Surgical History:  Procedure Laterality Date   CARDIOVERSION N/A 09/02/2017    Procedure: CARDIOVERSION;  Surgeon: [name], MD;  Location: [facility] ENDOSCOPY;  Service: Cardiovascular;  Laterality: N/A;   COLONOSCOPY  2011   great toe surgery     PROSTATE BIOPSY      Family History  Problem Relation Age of Onset   Hypertension Father 22       Deceased   Heart disease Father    Lung cancer Mother 50       Deceased   Healthy Sister        x3   Healthy Brother        x4   Heart disease Brother        #5   Other Daughter        Alpha Thalassemia   Lupus Daughter        #2-deceased   Diabetes Neg Hx    Heart attack Neg Hx    Hyperlipidemia Neg Hx    Sudden death Neg Hx     Social History   Tobacco Use   Smoking status: Never   Smokeless tobacco: Never   Tobacco comments:    Never smoke 03/11/22  Vaping Use   Vaping Use: Never used  Substance Use Topics   Alcohol use: No   Drug use: No     Allergies  Allergen Reactions   Crestor [Rosuvastatin Calcium] Shortness Of Breath  Muscle cramps   Spironolactone Shortness Of Breath   Terazosin Shortness Of Breath, Palpitations and Other (See Comments)    Nerve pain, also   Amiodarone Other (See Comments)    Dropped pulse real low to 45-50, and made him feel very cold   Atenolol Other (See Comments)    Drowsiness and "flu sxs"   Bystolic [Nebivolol Hcl] Other (See Comments)    GI issues   Gabapentin Other (See Comments)    "Did not help with pain"   Hydralazine Other (See Comments)    Joint swelling   Tizanidine Other (See Comments)    Syncope, Elevated BP/pulse   Clonidine Other (See Comments)    Unknown reaction   Hydrocodone-Acetaminophen Other (See Comments)    Unknown reaction   Levofloxacin Other (See Comments)    Unknown reaction   Losartan Other (See Comments)    Did not work.  Pt states he couldn't take b/c of SE, but can't remember what SE were.   Maxzide [Hydrochlorothiazide W-Triamterene] Other (See Comments)    Does not tolerate potassium-sparing diuretics   Olmesartan  Other (See Comments)    Unknown reaction   Tussionex Pennkinetic Er [Hydrocod Poli-Chlorphe Poli Er] Other (See Comments)    Unknown reaction    Review of Systems NEGATIVE UNLESS OTHERWISE INDICATED IN HPI      Objective:     BP (!) 144/106 (BP Location: Left Arm)   Pulse 92   Temp 97.9 F (36.6 C) (Temporal)   Ht 5' 8.6" (1.742 m)   Wt 208 lb 6.4 oz (94.5 kg)   SpO2 98%   BMI 31.14 kg/m   Wt Readings from Last 3 Encounters:  05/12/22 208 lb 6.4 oz (94.5 kg)  05/03/22 204 lb (92.5 kg)  04/16/22 202 lb 6.4 oz (91.8 kg)    BP Readings from Last 3 Encounters:  05/12/22 (!) 144/106  05/03/22 (!) 145/91  04/16/22 126/80     Physical Exam Vitals and nursing note reviewed.  Constitutional:      General: He is not in acute distress.    Appearance: Normal appearance. He is not toxic-appearing.  HENT:     Head: Normocephalic and atraumatic.     Right Ear: Ear canal and external ear normal.     Left Ear: Ear canal and external ear normal.     Nose: Nose normal.     Mouth/Throat:     Mouth: Mucous membranes are moist.     Pharynx: Oropharynx is clear.  Eyes:     Extraocular Movements: Extraocular movements intact.     Conjunctiva/sclera: Conjunctivae normal.     Pupils: Pupils are equal, round, and reactive to light.  Cardiovascular:     Rate and Rhythm: Normal rate. Rhythm irregular.     Pulses: Normal pulses.     Heart sounds: Normal heart sounds.  Pulmonary:     Effort: Pulmonary effort is normal.     Breath sounds: Normal breath sounds.  Musculoskeletal:     Cervical back: Normal range of motion and neck supple.  Skin:    General: Skin is warm and dry.  Neurological:     General: No focal deficit present.     Mental Status: He is alert and oriented to person, place, and time.     Cranial Nerves: No cranial nerve deficit.     Motor: No weakness.  Psychiatric:        Mood and Affect: Mood normal.        Behavior:  Behavior normal.        Assessment &  Plan:   Problem List Items Addressed This Visit       Digestive   Constipation due to pain medication     Nervous and Auditory   Lumbar radiculopathy     Other   Dyssynergic defecation   Other Visit Diagnoses     Vitamin D insufficiency    -  Primary   Abnormal CT of the abdomen          1. Vitamin D insufficiency Last vitamin D Lab Results  Component Value Date   VD25OH 17.4 (L) 05/03/2022   Rx 50,000 iu once weekly x 12 weeks sent to his pharmacy Follow up with 1000 to 2000 iu after that dose  2. Dyssynergic defecation 3. Lumbar radiculopathy New addition of Lyrica 50 mg TID is helping tremendously Pt to cont to have this filled by his pain management care team  4. Abnormal CT of the abdomen He did not follow up with Dr. Astrid Drafts office, but is still concerned, no new symptoms. Provided number for him again to call them to schedule.  5. Constipation due to pain medication Miralax as needed at this time seems to be helping the most. High fiber and water encouraged.    This note was prepared with assistance of Systems analyst. Occasional wrong-word or sound-a-like substitutions may have occurred due to the inherent limitations of voice recognition software.    Simren Popson M Aldon Hengst, PA-C

## 2022-05-17 ENCOUNTER — Telehealth: Payer: Self-pay | Admitting: Physician Assistant

## 2022-05-17 NOTE — Telephone Encounter (Signed)
FYI--Patient dropped off CD for imaging of Abdomen completed on 04/14/22. This has been placed in provider's box for further evaluation.

## 2022-05-19 ENCOUNTER — Other Ambulatory Visit: Payer: Self-pay | Admitting: Physician Assistant

## 2022-06-01 ENCOUNTER — Telehealth: Payer: Self-pay | Admitting: Pharmacist

## 2022-06-01 NOTE — Progress Notes (Signed)
Chronic Care Management Pharmacy Assistant   Name: Patrick Roth  MRN: [id number] DOB: November 12, 1946   Reason for Encounter: Hypertension Adherence Call    Recent office visits:  05/12/2022 [facility] (PCP) Roth, Patrick Evens, PA-C; Rx 50,000 iu once weekly x 12 weeks sent to his pharmacy Follow up with 1000 to 2000 iu after that dose  04/16/2022 [facility] (PCP) Roth, Patrick M, PA-C; no medication changes indicated.  Recent consult visits:  05/03/2022 [facility] (Neurology) Patrick Fila, MD; no medication changes indicated.  Hospital visits:  04/14/2022 ED visit for Abdominal pain -no medication changes  Medications: Outpatient Encounter Medications as of 06/01/2022  Medication Sig   amLODipine (NORVASC) 10 MG tablet Take 10 mg by mouth daily.   apixaban (ELIQUIS) 5 MG TABS tablet Take 1 tablet by mouth twice daily   dofetilide (TIKOSYN) 250 MCG capsule Take 1 capsule (250 mcg total) by mouth 2 (two) times daily.   oxyCODONE-acetaminophen (PERCOCET/ROXICET) 5-325 MG tablet Take 1 tablet by mouth every 8 (eight) hours as needed.   polyethylene glycol powder (GLYCOLAX/MIRALAX) 17 GM/SCOOP powder Take 17 g by mouth in the morning and at bedtime. Start with 1 scoop 2 times per day until bowels are moving, then reduce to 1 scoop daily. Drink 64oz of water daily.   potassium chloride SA (KLOR-CON M) 20 MEQ tablet TAKE 1 TABLET BY MOUTH IN THE MORNING AND TAKE 2 TABLETS IN THE EVENING   pregabalin (LYRICA) 50 MG capsule Take 50 mg by mouth 3 (three) times daily.   REPATHA SURECLICK 458 MG/ML SOAJ INJECT 1 PEN INTO THE SKIN EVERY 14 (FOURTEEN) DAYS   Vitamin D, Ergocalciferol, (DRISDOL) 1.25 MG (50000 UNIT) CAPS capsule Take 1 capsule (50,000 Units total) by mouth every 7 (seven) days.   No facility-administered encounter medications on file as of 06/01/2022.   Reviewed chart prior to disease state call. Spoke with patient regarding BP  Recent Office Vitals: BP Readings from Last 3 Encounters:   05/12/22 (!) 142/98  05/03/22 (!) 145/91  04/16/22 126/80   Pulse Readings from Last 3 Encounters:  05/12/22 92  05/03/22 98  04/16/22 65    Wt Readings from Last 3 Encounters:  05/12/22 208 lb 6.4 oz (94.5 kg)  05/03/22 204 lb (92.5 kg)  04/16/22 202 lb 6.4 oz (91.8 kg)     Kidney Function Lab Results  Component Value Date/Time   CREATININE 1.20 04/14/2022 01:49 PM   CREATININE 1.22 03/03/2022 01:07 PM   CREATININE 1.27 (H) 05/15/2020 09:54 AM   CREATININE 1.37 (H) 02/13/2020 09:24 AM   CREATININE 1.20 (H) 10/25/2016 11:40 AM   CREATININE 1.36 (H) 09/14/2016 10:19 AM   GFR 67.92 02/15/2022 11:47 AM   GFRNONAA >60 04/14/2022 01:49 PM   GFRNONAA 55 (L) 05/15/2020 09:54 AM   GFRAA 58 (L) 01/07/2021 10:01 AM   GFRAA >60 05/15/2020 09:54 AM       Latest Ref Rng & Units 04/14/2022    1:49 PM 03/03/2022    1:07 PM 02/15/2022   11:47 AM  BMP  Glucose 70 - 99 mg/dL 95  103  96   BUN 8 - 23 mg/dL '10  11  16   '$ Creatinine 0.61 - 1.24 mg/dL 1.20  1.22  1.06   Sodium 135 - 145 mmol/L 137  137  139   Potassium 3.5 - 5.1 mmol/L 4.0  4.2  3.9   Chloride 98 - 111 mmol/L 102  103  104   CO2 22 -  32 mmol/L '27  27  31   '$ Calcium 8.9 - 10.3 mg/dL 9.3  9.3  9.4     Current antihypertensive regimen:    How often are you checking your Blood Pressure? weekly  Current home BP readings: 121/78 HR 93  What recent interventions/DTPs have been made by any provider to improve Blood Pressure control since last CPP Visit: No recent interventions or DTPs.  Any recent hospitalizations or ED visits since last visit with CPP? Yes - patient went to ED for abdominal pain. He states he still has some occasional pain after eating. Patient states there was an object found on his CT scan that he believes may be a thumb tack that he swallowed he was a child.  What diet changes have been made to improve Blood Pressure Control?  Patient states his diet is doing well. He states he is not able to eat some  things because they cause his stomach to hurt.  What exercise is being done to improve your Blood Pressure Control?  Patient states he stays busy outside in the yard.  Adherence Review: Is the patient currently on ACE/ARB medication? No Does the patient have >5 day gap between last estimated fill dates? No   Care Gaps: Medicare Annual Wellness: Completed 10/12/2021 Hemoglobin A1C: 5.8% on 01/17/2021 Colonoscopy: Completed 11/04/2020  Future Appointments  Date Time Provider [place]  08/18/2022  9:30 AM Roth, Patrick Evens, PA-C [facility]  08/24/2022  1:00 PM Patrick Fila, MD [facility] None  09/28/2022  3:30 PM [facility] CCM PHARMACIST [facility]  10/25/2022  1:45 PM [facility] HEALTH COACH [facility]  11/10/2022  9:00 AM Bo Merino, MD [facility] None   Star Rating Drugs: None  April D Calhoun, [place] Pharmacist Assistant [phone]

## 2022-06-07 ENCOUNTER — Telehealth: Payer: Self-pay | Admitting: Physician Assistant

## 2022-06-07 NOTE — Telephone Encounter (Signed)
Pt states he can not get into [facility] until later this year. He states he is still having issues. He noticed it seems to be more when he eats beef. He wanted to let [place] know. Follow up visit? Please advise

## 2022-06-07 NOTE — Telephone Encounter (Signed)
Be advise and if you would like a f/u visit scheduled to discuss further

## 2022-06-10 DIAGNOSIS — G609 Hereditary and idiopathic neuropathy, unspecified: Secondary | ICD-10-CM | POA: Diagnosis not present

## 2022-06-11 ENCOUNTER — Telehealth: Payer: Self-pay | Admitting: Cardiology

## 2022-06-11 ENCOUNTER — Other Ambulatory Visit (HOSPITAL_COMMUNITY): Payer: Self-pay | Admitting: Nurse Practitioner

## 2022-06-11 NOTE — Telephone Encounter (Signed)
Pt c/o swelling: STAT is pt has developed SOB within 24 hours  If swelling, where is the swelling located? Legs   How much weight have you gained and in what time span? N/a  Have you gained 3 pounds in a day or 5 pounds in a week? N/a  Do you have a log of your daily weights (if so, list)? N/a  Are you currently taking a fluid pill? no  Are you currently SOB? no  Have you traveled recently? No   Pt states he went to see his PCP and they noticed some swelling and told him to see his cardiologist. Pt denies any other symptoms and is not currently taking a fluid pill. Pt made a f/u appt with [place], [place] 06/16/22 @ 8:25am.

## 2022-06-11 NOTE — Telephone Encounter (Signed)
Reports recent neuropathy dx. Not having AFib issues. Advised to keep f/u next week w/ the PA, but informed that I do not believe edema is heart related. Pt agreeable to keeping appt next week.

## 2022-06-11 NOTE — Telephone Encounter (Signed)
Left message to call back  

## 2022-06-15 NOTE — Progress Notes (Unsigned)
Cardiology Office Note Date:  06/15/2022  Patient ID:  Patrick Roth, Patrick Roth 1946-03-04, MRN [id number] PCP:  [name], PA-C  Cardiologist:  Dr. Acie Fredrickson Electrophysiologist: Dr. [name]    Chief Complaint:  6 mo  History of Present Illness: Patrick Roth is a 76 y.o. male with history of  AFib, HTN, CVA,  HLD, syncope attributed to orthostasis in 2017, h/o medication non complinace, another stroke Feb 2022 (compliant with his [place] at the time)  >> polycythemia, RBBB  He had an ER visit Sep 13, 2021, fever/chills, URI symptoms, COVID + Another ER visit 09/30/21 for recurrent dizziness, near syncope.  He was suspect to have a vagal episode associate with using the bathroom, though was found in Afib that was fast.  He was given IVF and lopressor with reported improved HR discharged from the ER NOTED there that metoprolol on his med list though pt was unfamiliar with the medicine, ? Not taking Back in the ER 10/04/21 Woke up as usual walked into the kitchen and started to feel weak, lightheaded and poorly again. This seems to always to some degree associated with some abdominal symptoms (he is struggling with constipation of sorts lately), he sat down and did feel a little better but when standing up again felt poorly. He did not have a sense of his Afib or racing but something "off" with his heart beat. Yesterday his HR was reported > 200, one of his EKG noted with rapid Afib 236bpm and was cardioverted (2 shocks required His last EKG yesterday was SB 54bpm   10/04/21 evening he was getting ready for bed and started feeling lightheaded again, He sat down and did feel better, but had an uneasy sense and his wife called EMS EMS tracings SR, PACs, one strip had a couple single PVCs, rates 70's EKG at the ER SR 61bpm    I saw him 10/05/21, unclear that with his baseline conductions sytem disease leary of rate controlling drigs, unclear is his symptoms of near syncope/weakness were RVR  (likely) though planned for monitoring to look for bradycardia as well. We discussed Tikosyn, PVI ablation and pace/ablate strategies, he wanted to think about it and follow up.   He had an ER visit 12/6, with near syncope associated with straining to have a BM, his HR went up and he took a couple doses of PRN lopressor went to the ER he was in AFib 120s, his HR settled to 90's-110's, he was feeling well and sent home He returned 12/7 and was admitted this time though it seems for diarrhea, not near syncope He was in AFib w/RVR and rates getting to 200's, he got lopressor in the ER with improvement in his HRs, admitted for further management   He was seen by GI who recommended outpt follow up for  Dyssynergic defecation - Outpatient anorectal manometry, pelvic floor physical therapy, can be coordinated by primary GI as outpatient - Daily Citrucel Globus sensation - Pepcid 20 mg PO daily for 4 weeks - Outpatient EGD with primary GI as previously planned  Had an abnormal barium swallow to follow put pt as well for this  During his stay he had recurrent symptoms of weakness and feeling near faint, these occurred at rest with GI symptoms and normal HRs/BP AND with exertion and RVR to 200s His chlorthalidone stopped with plans to admit 12/20 for Tikosyn, and started on diltiazem, titrated and discharged 11/01/21 with improved HR control with plans to keep his f/u  for repeat BMET, started on K+ supplementation  His symptoms felt to be multifactorial with RVR and GI/vagal  I saw him Dec 2022 He is doing OK, his HR has been good, his back/hip though are hurting (a known problem for him) No near fainting spells, no palpitations, no CP No bleeding or signs of bleeding He has the AFib clinic and admission instructions He confirms he is NOT taking chlorthalidone He confirms his IS taking his eliquis without missed doses He was planned to get an EGD soon, he will need to wait until after his Tikosyn  admission for 1 month of uninterrupted a/c, I discussed this with him. Planned for chlorthalidone washout and Tikosyn admission  He saw Dr. [name] 12/11/21, doing well, minimal AF burden, still struggling with GI issues which is a provoker for his AF  He had an PMD visit April 2023 with abdominal complaints found in AFlutter ad referred to the ER, pt did not want DCCV gioven it would further delay his GI w/u and minimally if any symptoms with the flutter Saw the AFib clinic 4/20 back in [place] to struggle some with abdominal cramping/occasional difficulty with BM, though is better. Also struggling with hemorrhoid pain that seems to trigger feet pain/burning and has been told is a neuropathy. Uncertain of that connection, he follows with his PMD and GI  Cardiac-wise he feels much improved on the Tikosyn, feels his AF burden is significantly improved and only now triggered by bouts of his GI discomfort. He had some trouble with his BM this AM and felt that he went out of rhythm, but otherwise not symptomatic with this  Reports eliquis and Tikosyn compliance No bleeding  No CP No near syncope or syncope.     AFib Hx Diagnosed 2017   AAD hx Started March 2019 >> seems self stopped sometime in the Fall 2021 2/2 making him feel cold and lowered his pulse Toprol was stopped by the patient making him feel bad, poorly tolerated  Past Medical History:  Diagnosis Date   Atrial fibrillation/flutter    Intol of amio, beta blocker // Echo 12/22: inf HK, EF 50-55, normal RVSF, mild LaE, mild MR, mod MAC, AV sclerosis w/o aS   Colon polyps    Constipation 05/27/2015   DOE (dyspnea on exertion) 10/13/2015   Faintness 06/20/2016   History of chicken pox    Hyperlipidemia    Hypertension    Internal hemorrhoids    Polycythemia    Sciatica    Stroke ([place])    Hx of stroke in2008; recurrent CVA in 12/2020   Syncope and collapse 06/28/2016    Past Surgical History:  Procedure  Laterality Date   CARDIOVERSION N/A 09/02/2017   Procedure: CARDIOVERSION;  Surgeon: [name], MD;  Location: [facility] ENDOSCOPY;  Service: Cardiovascular;  Laterality: N/A;   COLONOSCOPY  2011   great toe surgery     PROSTATE BIOPSY      Current Outpatient Medications  Medication Sig Dispense Refill   amLODipine (NORVASC) 10 MG tablet Take 10 mg by mouth daily.     apixaban (ELIQUIS) 5 MG TABS tablet Take 1 tablet by mouth twice daily 60 tablet 10   dofetilide (TIKOSYN) 250 MCG capsule Take 1 capsule by mouth twice daily 60 capsule 0   oxyCODONE-acetaminophen (PERCOCET/ROXICET) 5-325 MG tablet Take 1 tablet by mouth every 8 (eight) hours as needed.     polyethylene glycol powder (GLYCOLAX/MIRALAX) 17 GM/SCOOP powder Take 17 g by  mouth in the morning and at bedtime. Start with 1 scoop 2 times per day until bowels are moving, then reduce to 1 scoop daily. Drink 64oz of water daily. 3350 g 1   potassium chloride SA (KLOR-CON M) 20 MEQ tablet TAKE 1 TABLET BY MOUTH IN THE MORNING AND TAKE 2 TABLETS IN THE EVENING 270 tablet 0   pregabalin (LYRICA) 50 MG capsule Take 50 mg by mouth 3 (three) times daily.     REPATHA SURECLICK 885 MG/ML SOAJ INJECT 1 PEN INTO THE SKIN EVERY 14 (FOURTEEN) DAYS 6 mL 3   Vitamin D, Ergocalciferol, (DRISDOL) 1.25 MG (50000 UNIT) CAPS capsule Take 1 capsule (50,000 Units total) by mouth every 7 (seven) days. 12 capsule 0   No current facility-administered medications for this visit.    Allergies:   Crestor [rosuvastatin calcium], Spironolactone, Terazosin, Amiodarone, Atenolol, Bystolic [nebivolol hcl], Gabapentin, Hydralazine, Tizanidine, Clonidine, Hydrocodone-acetaminophen, Levofloxacin, Losartan, Maxzide [hydrochlorothiazide w-triamterene], Olmesartan, and Tussionex pennkinetic er [hydrocod poli-chlorphe poli er]   Social History:  The patient  reports that he has never smoked. He has never used smokeless tobacco. He reports that he does not drink alcohol and  does not use drugs.   Family History:  The patient's family history includes Healthy in his brother and sister; Heart disease in his brother and father; Hypertension (age of onset: 28) in his father; Lung cancer (age of onset: 34) in his mother; Lupus in his daughter; Other in his daughter.  ROS:  Please see the history of present illness.    All other systems are reviewed and otherwise negative.   PHYSICAL EXAM:  VS:  There were no vitals taken for this visit. BMI: There is no height or weight on file to calculate BMI. Well nourished, well developed, in no acute distress HEENT: normocephalic, atraumatic Neck: no JVD, carotid bruits or masses Cardiac: RRR; no significant murmurs, no rubs, or gallops Lungs:  CTA b/l, no wheezing, rhonchi or rales Abd: soft, nontender MS: no deformity or atrophy Ext:  1-2+ edema R/L to below theknee Skin: warm and dry, no rash Neuro:  No gross deficits appreciated Psych: euthymic mood, full affect   EKG:  done today and reviewed by myself and with Dr. Lovena Le AFlutter 94bpm, RBBB, LAD   10/31/21: TTE IMPRESSIONS   1. Inferior basal hypokinesis . Left ventricular ejection fraction, by  estimation, is 50 to 55%. The left ventricle has low normal function. The  left ventricle demonstrates regional wall motion abnormalities (see  scoring diagram/findings for  description). There is mild left ventricular hypertrophy. Left ventricular  diastolic parameters were normal.   2. Right ventricular systolic function is normal. The right ventricular  size is normal. There is normal pulmonary artery systolic pressure.   3. Left atrial size was mildly dilated.   4. The mitral valve is degenerative. Mild mitral valve regurgitation. No  evidence of mitral stenosis. Moderate mitral annular calcification.   5. The aortic valve is tricuspid. Aortic valve regurgitation is not  visualized. Aortic valve sclerosis/calcification is present, without any  evidence of  aortic stenosis.   6. The inferior vena cava is normal in size with greater than 50%  respiratory variability, suggesting right atrial pressure of 3 mmHg.   Nov 2022, monitor (7 days) Predominant rhythm was sinus rhythm 1% ventricular ectopy 6.2% supraventricular ectopy Multiple SVT episodes, all less than 20 seconds No symptoms recorded     Nov 2021 Monitoring Max 215 bpm 02:28pm, 10/28 Min 82 bpm 11:16am, 10/28 Avg  112 bpm 3.3% PVC burden, rare supraventricular ectopy Atrial fibrillation/flutter occurred 100% of the time Longest ventricular run 10 seconds at 161 bpm No symptoms recorded       02/24/2018: stress myoview Nuclear stress EF: 58%. Normal perfusion. No ischemia This is a low risk study.       02/24/2018: TTE Study Conclusions  - Left ventricle: The cavity size was normal. Systolic function was    normal. The estimated ejection fraction was in the range of 55%    to 60%. Wall motion was normal; there were no regional wall    motion abnormalities. There was a reduced contribution of atrial    contraction to ventricular filling, due to increased ventricular    diastolic pressure or atrial contractile dysfunction. The study    is not technically sufficient to allow evaluation of LV diastolic    function. (suspect left atrial mechanical failure, possible    stunning after recent episode of atrial fibrillation versus    chronic atrial dysfunction)  - Left atrium: The atrium was mildly dilated.  - Right ventricle: The cavity size was mildly dilated. Wall    thickness was normal.  - Right atrium: The atrium was mildly dilated.  - Pulmonary arteries: Systolic pressure was moderately increased.    PA peak pressure: 60 mm Hg (S).    6./15/18: 48 hor holter Minimum HR: 37 BPM at 12:18:54 PM Maximum HR: 124 BPM at 7:11:26 PM Average HR: 50 BPM 1% ventricular beats 4% supraventricular beats Ventricular runs of 6 beats Supraventricular runs of 13 beats   Zero  atrial fibrillaiton noted on monitor. Average HR 50. No med changes at this time.  Will [name], MD   10/01/16 TTE - Left ventricle: The cavity size was normal. Systolic function was   normal. The estimated ejection fraction was in the range of 55%   to 60%. Wall motion was normal; there were no regional wall   motion abnormalities. Doppler parameters are consistent with a   reversible restrictive pattern, indicative of decreased left   ventricular diastolic compliance and/or increased left atrial   pressure (grade 3 diastolic dysfunction). Doppler parameters are   consistent with high ventricular filling pressure. - Aortic valve: Transvalvular velocity was within the normal range.   There was no stenosis. - Mitral valve: Transvalvular velocity was within the normal range.   There was no evidence for stenosis. There was mild regurgitation. - Right ventricle: The cavity size was mildly dilated. Wall   thickness was normal. Systolic function was normal. - Atrial septum: No defect or patent foramen ovale was identified   by color flow Doppler. - Tricuspid valve: There was mild regurgitation. - Pulmonary arteries: Systolic pressure was moderately to severely   increased. PA peak pressure: 57 mm Hg (S). - Pericardium, extracardiac: A trivial pericardial effusion was   identified.   10/10/15: lexiscan stress myoview There was no ST segment deviation noted during stress. No T wave inversion was noted during stress. The study is normal. This is a low risk study.  Low risk stress nuclear study with normal perfusion. Non gated study.  Recent Labs: 10/28/2021: B Natriuretic Peptide 116.2 11/19/2021: Magnesium 2.2 04/14/2022: ALT 11; BUN 10; Creatinine, Ser 1.20; Hemoglobin 15.2; Platelets 168; Potassium 4.0; Sodium 137 05/03/2022: TSH 1.470  No results found for requested labs within last 365 days.   CrCl cannot be calculated (Patient's most recent lab result is older than the maximum 21  days allowed.).   Wt Readings from Last  3 Encounters:  05/12/22 208 lb 6.4 oz (94.5 kg)  05/03/22 204 lb (92.5 kg)  04/16/22 202 lb 6.4 oz (91.8 kg)     Other studies reviewed: Additional studies/records reviewed today include: summarized above  ASSESSMENT AND PLAN:  Paroxysmal AFib CHA2DS2Vasc is 5, on Eliquis, appropriately dosed Tiksoyn QT is OK given RBBB, my manual measurement and QT also include a flutter wave as well  He feels like his symptoms are very reliable, he knew he was out of rhythm, but behavior since on Tikosyn is that it will correct again He is rate controlled and otherwise asymptomatic He has used the PRN metoprolol once about 2 weeks ago for HR 130 and BP 180/100, worked very well, BP and HR simmered down quickly  Avoid daily nodal blocker with SB and conduction system disease  Labs today   HTN A little high and higher at home   Edema Better in the AM, increases as the day wears on Perhaps his norvasc though with bradycardia will leave it Start low dose lasix BMET in a week  Disposition: I will see him back in a couple weeks, sooner if needed  Current medicines are reviewed at length with the patient today.  The patient did not have any concerns regarding medicines.  Venetia Night, PA-C 06/15/2022 7:00 AM     [address] [place] [address] [phone] (office)  [phone] (fax)

## 2022-06-16 ENCOUNTER — Ambulatory Visit: Payer: Medicare PPO | Admitting: Physician Assistant

## 2022-06-16 ENCOUNTER — Encounter: Payer: Self-pay | Admitting: Physician Assistant

## 2022-06-16 VITALS — BP 132/90 | HR 84 | Ht 68.6 in | Wt 211.2 lb

## 2022-06-16 DIAGNOSIS — Z5181 Encounter for therapeutic drug level monitoring: Secondary | ICD-10-CM | POA: Diagnosis not present

## 2022-06-16 DIAGNOSIS — Z79899 Other long term (current) drug therapy: Secondary | ICD-10-CM | POA: Diagnosis not present

## 2022-06-16 DIAGNOSIS — R609 Edema, unspecified: Secondary | ICD-10-CM | POA: Diagnosis not present

## 2022-06-16 DIAGNOSIS — I10 Essential (primary) hypertension: Secondary | ICD-10-CM

## 2022-06-16 DIAGNOSIS — I480 Paroxysmal atrial fibrillation: Secondary | ICD-10-CM | POA: Diagnosis not present

## 2022-06-16 LAB — BASIC METABOLIC PANEL
BUN/Creatinine Ratio: 11 (ref 10–24)
BUN: 14 mg/dL (ref 8–27)
CO2: 24 mmol/L (ref 20–29)
Calcium: 9.3 mg/dL (ref 8.6–10.2)
Chloride: 103 mmol/L (ref 96–106)
Creatinine, Ser: 1.29 mg/dL — ABNORMAL HIGH (ref 0.76–1.27)
Glucose: 89 mg/dL (ref 70–99)
Potassium: 4.2 mmol/L (ref 3.5–5.2)
Sodium: 141 mmol/L (ref 134–144)
eGFR: 57 mL/min/{1.73_m2} — ABNORMAL LOW (ref >59)

## 2022-06-16 LAB — MAGNESIUM: Magnesium: 2 mg/dL (ref 1.6–2.3)

## 2022-06-16 MED ORDER — FUROSEMIDE 20 MG PO TABS: 20.0000 mg | ORAL_TABLET | Freq: Every day | ORAL | 1 refills | Status: DC

## 2022-06-16 NOTE — Patient Instructions (Addendum)
Medication Instructions:  Your physician has recommended you make the following change in your medication:    START TAKING-  LASIX 20 Mg-  Take one tablet by mouth once a day.    Labwork: You will have blood work drawn today:  Magnesium, and BMET   Testing/Procedures: None ordered.  Follow-Up: Your physician wants you to follow-up in: TWO WEEKS with Tommye Standard, PA-C.   REPEAT BMET in 1 Week.     Any Other Special Instructions Will Be Listed Below (If Applicable).  If you need a refill on your cardiac medications before your next appointment, please call your pharmacy.   Important Information About Sugar

## 2022-06-22 ENCOUNTER — Telehealth: Payer: Self-pay | Admitting: Physician Assistant

## 2022-06-22 NOTE — Telephone Encounter (Signed)
Called pt and advised per Alyssa never received a disk for his CT scan. Pt states left up at front office for her and advised I will check with office staff to see if someone else got it by mistake.

## 2022-06-22 NOTE — Telephone Encounter (Signed)
Patient states he gave Provider an imaging disk from CT Scan. Patient requests to pick up the imaging disk to give to his GI Doctor at his appointment tomorrow 06/23/22. Patient requests to be called at ph# [phone] to let him know that he can pick it up.

## 2022-06-22 NOTE — Telephone Encounter (Signed)
Please advise if you still have this disk so I can contact the patient

## 2022-06-23 ENCOUNTER — Other Ambulatory Visit: Payer: Medicare PPO

## 2022-06-23 DIAGNOSIS — I10 Essential (primary) hypertension: Secondary | ICD-10-CM

## 2022-06-23 DIAGNOSIS — Z5181 Encounter for therapeutic drug level monitoring: Secondary | ICD-10-CM | POA: Diagnosis not present

## 2022-06-23 DIAGNOSIS — G8929 Other chronic pain: Secondary | ICD-10-CM | POA: Diagnosis not present

## 2022-06-23 DIAGNOSIS — Z79899 Other long term (current) drug therapy: Secondary | ICD-10-CM | POA: Diagnosis not present

## 2022-06-23 DIAGNOSIS — K5909 Other constipation: Secondary | ICD-10-CM | POA: Diagnosis not present

## 2022-06-23 DIAGNOSIS — I480 Paroxysmal atrial fibrillation: Secondary | ICD-10-CM | POA: Diagnosis not present

## 2022-06-23 DIAGNOSIS — R9389 Abnormal findings on diagnostic imaging of other specified body structures: Secondary | ICD-10-CM | POA: Diagnosis not present

## 2022-06-23 DIAGNOSIS — I482 Chronic atrial fibrillation, unspecified: Secondary | ICD-10-CM | POA: Diagnosis not present

## 2022-06-23 DIAGNOSIS — R1084 Generalized abdominal pain: Secondary | ICD-10-CM | POA: Diagnosis not present

## 2022-06-23 LAB — BASIC METABOLIC PANEL
BUN/Creatinine Ratio: 8 — ABNORMAL LOW (ref 10–24)
BUN: 11 mg/dL (ref 8–27)
CO2: 25 mmol/L (ref 20–29)
Calcium: 9.6 mg/dL (ref 8.6–10.2)
Chloride: 102 mmol/L (ref 96–106)
Creatinine, Ser: 1.34 mg/dL — ABNORMAL HIGH (ref 0.76–1.27)
Glucose: 89 mg/dL (ref 70–99)
Potassium: 4 mmol/L (ref 3.5–5.2)
Sodium: 141 mmol/L (ref 134–144)
eGFR: 55 mL/min/{1.73_m2} — ABNORMAL LOW (ref >59)

## 2022-06-23 NOTE — Telephone Encounter (Signed)
Called pt and lvm that disk was located in the office and he can come by and pick up at any time. Will keep on my desk until patient arrives to pick up.

## 2022-06-28 ENCOUNTER — Other Ambulatory Visit: Payer: Self-pay | Admitting: *Deleted

## 2022-06-28 DIAGNOSIS — Z79899 Other long term (current) drug therapy: Secondary | ICD-10-CM

## 2022-06-28 NOTE — Progress Notes (Signed)
Cardiology Office Note Date:  06/28/2022  Patient ID:  Patrick Roth, Patrick Roth [birth year], MRN [id number] PCP:  [name], PA-C  Cardiologist:  Dr. Acie Fredrickson Electrophysiologist: Dr. [name]    Chief Complaint:   2 week  History of Present Illness: Patrick Roth is a 76 y.o. male with history of  AFib, HTN, CVA,  HLD, syncope attributed to orthostasis in 2017, h/o medication non complinace, another stroke Feb 2022 (compliant with his [employer] at the time)  >> polycythemia, RBBB  He had an ER visit Sep 13, 2021, fever/chills, URI symptoms, COVID + Another ER visit 09/30/21 for recurrent dizziness, near syncope.  He was suspect to have a vagal episode associate with using the bathroom, though was found in Afib that was fast.  He was given IVF and lopressor with reported improved HR discharged from the ER NOTED there that metoprolol on his med list though pt was unfamiliar with the medicine, ? Not taking Back in the ER 10/04/21 Woke up as usual walked into the kitchen and started to feel weak, lightheaded and poorly again. This seems to always to some degree associated with some abdominal symptoms (he is struggling with constipation of sorts lately), he sat down and did feel a little better but when standing up again felt poorly. He did not have a sense of his Afib or racing but something "off" with his heart beat. Yesterday his HR was reported > 200, one of his EKG noted with rapid Afib 236bpm and was cardioverted (2 shocks required His last EKG yesterday was SB 54bpm   10/04/21 evening he was getting ready for bed and started feeling lightheaded again, He sat down and did feel better, but had an uneasy sense and his wife called EMS EMS tracings [place], PACs, one strip had a couple single PVCs, rates 70's EKG at the ER [place] 61bpm    I saw him 10/05/21, unclear that with his baseline conductions sytem disease leary of rate controlling drigs, unclear is his symptoms of near syncope/weakness were RVR  (likely) though planned for monitoring to look for bradycardia as well. We discussed Tikosyn, PVI ablation and pace/ablate strategies, he wanted to think about it and follow up.   He had an ER visit 12/6, with near syncope associated with straining to have a BM, his HR went up and he took a couple doses of PRN lopressor went to the ER he was in AFib 120s, his HR settled to 90's-110's, he was feeling well and sent home He returned 12/7 and was admitted this time though it seems for diarrhea, not near syncope He was in AFib w/RVR and rates getting to 200's, he got lopressor in the ER with improvement in his HRs, admitted for further management   He was seen by GI who recommended outpt follow up for  Dyssynergic defecation - Outpatient anorectal manometry, pelvic floor physical therapy, can be coordinated by primary GI as outpatient - Daily Citrucel Globus sensation - Pepcid 20 mg PO daily for 4 weeks - Outpatient EGD with primary GI as previously planned  Had an abnormal barium swallow to follow put pt as well for this  During his stay he had recurrent symptoms of weakness and feeling near faint, these occurred at rest with GI symptoms and normal HRs/BP AND with exertion and RVR to 200s His chlorthalidone stopped with plans to admit 12/20 for Tikosyn, and started on diltiazem, titrated and discharged 11/01/21 with improved HR control with plans to keep his  f/u for repeat BMET, started on K+ supplementation  His symptoms felt to be multifactorial with RVR and GI/vagal  I saw him Dec 2022 He is doing OK, his HR has been good, his back/hip though are hurting (a known problem for him) No near fainting spells, no palpitations, no CP No bleeding or signs of bleeding He has the AFib clinic and admission instructions He confirms he is NOT taking chlorthalidone He confirms his IS taking his eliquis without missed doses He was planned to get an EGD soon, he will need to wait until after his Tikosyn  admission for 1 month of uninterrupted a/c, I discussed this with him. Planned for chlorthalidone washout and Tikosyn admission  He saw Dr. [name] 12/11/21, doing well, minimal AF burden, still struggling with GI issues which is a provoker for his AF  He had an PMD visit April 2023 with abdominal complaints found in AFlutter ad referred to the ER, pt did not want DCCV gioven it would further delay his GI w/u and minimally if any symptoms with the flutter Saw the AFib clinic 4/20 back in [place]   I saw him 06/16/22 Continues to struggle some with abdominal cramping/occasional difficulty with BM, though is better. Also struggling with hemorrhoid pain that seems to trigger feet pain/burning and has been told is a neuropathy. Uncertain of that connection, he follows with his PMD and GI Cardiac-wise he feels much improved on the Tikosyn, feels his AF burden is significantly improved and only now triggered by bouts of his GI discomfort. He had some trouble with his BM this AM and felt that he went out of rhythm, but otherwise not symptomatic with this Reports eliquis and Tikosyn compliance No bleeding No CP No near syncope or syncope. He was in [place], rate controlled, he could tell and reported that on Tikosyn his AF behavior since on Tikosyn is that it will correct again. Planned to avoid daily nodal blocker with baseline conduction system disease. PRN [place] was working well. Edema perhaps 2/2 amlodipine started on low dose lasix for his edema, planned for close labs and early follow up  TODAY He continues to struggle with GI stuff Reports immediate belly bloating/cramping with beef Continues to have a lot of rectal pressure and difficult BM's and this always provokes his AFib and fast rates. He is certain that he is not always in AFib/flutter and can tell. He knew his heart was racing can feel it but assures me that he does not feel bad. Denies CP, SOB, not dizzy or weak He enthusiastically  tells me that his heart rhythm control is the best it has ever been with the Tikosyn and very much wants to continue   Edema not much better, this as well he attributes to the Ams issue as well, and says it is typically better now towards mid-day    AFib Hx Diagnosed 2017   AAD hx Started amiodarone March 2019 >> seems self stopped sometime in the Fall 2021 2/2 making him feel cold and lowered his pulse Toprol was stopped by the patient making him feel bad, poorly tolerated  Past Medical History:  Diagnosis Date   Atrial fibrillation/flutter    Intol of amio, beta blocker // Echo 12/22: inf HK, EF 50-55, normal RVSF, mild LaE, mild MR, mod MAC, AV sclerosis w/o aS   Colon polyps    Constipation 05/27/2015   DOE (dyspnea on exertion) 10/13/2015   Faintness 06/20/2016   History of chicken pox  Hyperlipidemia    Hypertension    Internal hemorrhoids    Polycythemia    Sciatica    Stroke [facility])    Hx of stroke in2008; recurrent CVA in 12/2020   Syncope and collapse 06/28/2016    Past Surgical History:  Procedure Laterality Date   CARDIOVERSION N/A 09/02/2017   Procedure: CARDIOVERSION;  Surgeon: [name], MD;  Location: [facility] ENDOSCOPY;  Service: Cardiovascular;  Laterality: N/A;   COLONOSCOPY  2011   great toe surgery     PROSTATE BIOPSY      Current Outpatient Medications  Medication Sig Dispense Refill   amLODipine (NORVASC) 10 MG tablet Take 10 mg by mouth daily.     apixaban (ELIQUIS) 5 MG TABS tablet Take 1 tablet by mouth twice daily 60 tablet 10   dofetilide (TIKOSYN) 250 MCG capsule Take 1 capsule by mouth twice daily 60 capsule 0   furosemide (LASIX) 20 MG tablet Take 1 tablet (20 mg total) by mouth daily. 45 tablet 1   metoprolol tartrate (LOPRESSOR) 25 MG tablet Take 25 mg by mouth as needed (if Heart right goes above 125).     oxyCODONE-acetaminophen (PERCOCET/ROXICET) 5-325 MG tablet Take 1 tablet by mouth every 8 (eight) hours as needed.     polyethylene  glycol powder (GLYCOLAX/MIRALAX) 17 GM/SCOOP powder Take 17 g by mouth in the morning and at bedtime. Start with 1 scoop 2 times per day until bowels are moving, then reduce to 1 scoop daily. Drink 64oz of water daily. 3350 g 1   potassium chloride SA (KLOR-CON M) 20 MEQ tablet TAKE 1 TABLET BY MOUTH IN THE MORNING AND TAKE 2 TABLETS IN THE EVENING 270 tablet 0   pregabalin (LYRICA) 50 MG capsule Take 50 mg by mouth 3 (three) times daily.     REPATHA SURECLICK 427 MG/ML SOAJ INJECT 1 PEN INTO THE SKIN EVERY 14 (FOURTEEN) DAYS 6 mL 3   Vitamin D, Ergocalciferol, (DRISDOL) 1.25 MG (50000 UNIT) CAPS capsule Take 1 capsule (50,000 Units total) by mouth every 7 (seven) days. 12 capsule 0   No current facility-administered medications for this visit.    Allergies:   Crestor [rosuvastatin calcium], Spironolactone, Terazosin, Amiodarone, Atenolol, Bystolic [nebivolol hcl], Gabapentin, Hydralazine, Tizanidine, Clonidine, Hydrocodone-acetaminophen, Levofloxacin, Losartan, Maxzide [hydrochlorothiazide w-triamterene], Olmesartan, and Tussionex pennkinetic er [hydrocod poli-chlorphe poli er]   Social History:  The patient  reports that he has never smoked. He has never used smokeless tobacco. He reports that he does not drink alcohol and does not use drugs.   Family History:  The patient's family history includes Healthy in his brother and sister; Heart disease in his brother and father; Hypertension (age of onset: 51) in his father; Lung cancer (age of onset: 66) in his mother; Lupus in his daughter; Other in his daughter.  ROS:  Please see the history of present illness.    All other systems are reviewed and otherwise negative.   PHYSICAL EXAM:  VS:  There were no vitals taken for this visit. BMI: There is no height or weight on file to calculate BMI. Well nourished, well developed, in no acute distress HEENT: normocephalic, atraumatic Neck: no JVD, carotid bruits or masses Cardiac: Regular and  tachycardic; no significant murmurs, no rubs, or gallops Lungs:  CTA b/l, no wheezing, rhonchi or rales Abd: soft, nontender MS: no deformity or atrophy Ext:  2+ edema R/L to below the knee Skin: warm and dry, no rash Neuro:  No gross deficits appreciated Psych: euthymic mood,  full affect   EKG:  done today and reviewed by myself  AFlutter 176bpm, PVC, QTc is very difficult to establish with flutter and HR, last EKG Qtc was OK   10/31/21: TTE IMPRESSIONS   1. Inferior basal hypokinesis . Left ventricular ejection fraction, by  estimation, is 50 to 55%. The left ventricle has low normal function. The  left ventricle demonstrates regional wall motion abnormalities (see  scoring diagram/findings for  description). There is mild left ventricular hypertrophy. Left ventricular  diastolic parameters were normal.   2. Right ventricular systolic function is normal. The right ventricular  size is normal. There is normal pulmonary artery systolic pressure.   3. Left atrial size was mildly dilated.   4. The mitral valve is degenerative. Mild mitral valve regurgitation. No  evidence of mitral stenosis. Moderate mitral annular calcification.   5. The aortic valve is tricuspid. Aortic valve regurgitation is not  visualized. Aortic valve sclerosis/calcification is present, without any  evidence of aortic stenosis.   6. The inferior vena cava is normal in size with greater than 50%  respiratory variability, suggesting right atrial pressure of 3 mmHg.   Nov 2022, monitor (7 days) Predominant rhythm was sinus rhythm 1% ventricular ectopy 6.2% supraventricular ectopy Multiple SVT episodes, all less than 20 seconds No symptoms recorded     Nov 2021 Monitoring Max 215 bpm 02:28pm, 10/28 Min 82 bpm 11:16am, 10/28 Avg 112 bpm 3.3% PVC burden, rare supraventricular ectopy Atrial fibrillation/flutter occurred 100% of the time Longest ventricular run 10 seconds at 161 bpm No symptoms recorded        02/24/2018: stress myoview Nuclear stress EF: 58%. Normal perfusion. No ischemia This is a low risk study.       02/24/2018: TTE Study Conclusions  - Left ventricle: The cavity size was normal. Systolic function was    normal. The estimated ejection fraction was in the range of 55%    to 60%. Wall motion was normal; there were no regional wall    motion abnormalities. There was a reduced contribution of atrial    contraction to ventricular filling, due to increased ventricular    diastolic pressure or atrial contractile dysfunction. The study    is not technically sufficient to allow evaluation of LV diastolic    function. (suspect left atrial mechanical failure, possible    stunning after recent episode of atrial fibrillation versus    chronic atrial dysfunction)  - Left atrium: The atrium was mildly dilated.  - Right ventricle: The cavity size was mildly dilated. Wall    thickness was normal.  - Right atrium: The atrium was mildly dilated.  - Pulmonary arteries: Systolic pressure was moderately increased.    PA peak pressure: 60 mm Hg (S).    6./15/18: 48 hor holter Minimum HR: 37 BPM at 12:18:54 PM Maximum HR: 124 BPM at 7:11:26 PM Average HR: 50 BPM 1% ventricular beats 4% supraventricular beats Ventricular runs of 6 beats Supraventricular runs of 13 beats   Zero atrial fibrillaiton noted on monitor. Average HR 50. No med changes at this time.  Will [name], MD   10/01/16 TTE - Left ventricle: The cavity size was normal. Systolic function was   normal. The estimated ejection fraction was in the range of 55%   to 60%. Wall motion was normal; there were no regional wall   motion abnormalities. Doppler parameters are consistent with a   reversible restrictive pattern, indicative of decreased left   ventricular diastolic compliance and/or increased  left atrial   pressure (grade 3 diastolic dysfunction). Doppler parameters are   consistent with high ventricular  filling pressure. - Aortic valve: Transvalvular velocity was within the normal range.   There was no stenosis. - Mitral valve: Transvalvular velocity was within the normal range.   There was no evidence for stenosis. There was mild regurgitation. - Right ventricle: The cavity size was mildly dilated. Wall   thickness was normal. Systolic function was normal. - Atrial septum: No defect or patent foramen ovale was identified   by color flow Doppler. - Tricuspid valve: There was mild regurgitation. - Pulmonary arteries: Systolic pressure was moderately to severely   increased. PA peak pressure: 57 mm Hg (S). - Pericardium, extracardiac: A trivial pericardial effusion was   identified.   10/10/15: lexiscan stress myoview There was no ST segment deviation noted during stress. No T wave inversion was noted during stress. The study is normal. This is a low risk study.  Low risk stress nuclear study with normal perfusion. Non gated study.  Recent Labs: 10/28/2021: B Natriuretic Peptide 116.2 04/14/2022: ALT 11; Hemoglobin 15.2; Platelets 168 05/03/2022: TSH 1.470 06/16/2022: Magnesium 2.0 06/23/2022: BUN 11; Creatinine, Ser 1.34; Potassium 4.0; Sodium 141  No results found for requested labs within last 365 days.   Estimated Creatinine Clearance: 52.4 mL/min (A) (by C-G formula based on SCr of 1.34 mg/dL (H)).   Wt Readings from Last 3 Encounters:  06/16/22 211 lb 3.2 oz (95.8 kg)  05/12/22 208 lb 6.4 oz (94.5 kg)  05/03/22 204 lb (92.5 kg)     Other studies reviewed: Additional studies/records reviewed today include: summarized above  ASSESSMENT AND PLAN:  Paroxysmal AFib CHA2DS2Vasc is 5, on Eliquis, appropriately dosed Tikosyn QTc today is very difficult to establish. Last EKG a couple weeks ago looked OK  LONG discussion today recommend he go to the ER.  He is not agreeable.  He reports he can very much tell his HR/rhythm and this is not a dialyl event but typically fast rates  only in the AM, and once his has his Tikosyn and a BM settles He assures me that he feels fine and does not want to go to the ER. Advised to take his PRN metoprolol once home.  Discussed with the DOD, can not make him go, to discuss ER precautions Discussed ER precautions, he understands and will go should he start to feel poorly or if his HR does not settle down to sustained rates <120 at least  he reports excellent medicine compliance  Avoid daily nodal blocker with SB and conduction system disease   HTN stable  Edema Not resolved but he reports has been better and gets better as the day goes  No SOB Lungs are clear BMET today  Disposition: unable to get a spot for him in a week for an OV but will have him come for EKG and RN visit next week, and clinic in a couple weeks again.    Current medicines are reviewed at length with the patient today.  The patient did not have any concerns regarding medicines.  Venetia Night, PA-C 06/28/2022 1:57 PM     [place] [address] [phone] (office)  [phone] (fax)

## 2022-06-29 ENCOUNTER — Other Ambulatory Visit: Payer: Self-pay | Admitting: *Deleted

## 2022-06-29 ENCOUNTER — Institutional Professional Consult (permissible substitution): Payer: Medicare HMO | Admitting: Neurology

## 2022-07-02 ENCOUNTER — Encounter: Payer: Self-pay | Admitting: Physician Assistant

## 2022-07-02 ENCOUNTER — Telehealth: Payer: Self-pay | Admitting: *Deleted

## 2022-07-02 ENCOUNTER — Ambulatory Visit: Payer: Medicare PPO | Admitting: Physician Assistant

## 2022-07-02 VITALS — BP 110/90 | HR 176 | Ht 68.0 in | Wt 210.0 lb

## 2022-07-02 DIAGNOSIS — I10 Essential (primary) hypertension: Secondary | ICD-10-CM | POA: Diagnosis not present

## 2022-07-02 DIAGNOSIS — I480 Paroxysmal atrial fibrillation: Secondary | ICD-10-CM | POA: Diagnosis not present

## 2022-07-02 DIAGNOSIS — Z79899 Other long term (current) drug therapy: Secondary | ICD-10-CM

## 2022-07-02 DIAGNOSIS — I4892 Unspecified atrial flutter: Secondary | ICD-10-CM

## 2022-07-02 LAB — BASIC METABOLIC PANEL
BUN/Creatinine Ratio: 8 — ABNORMAL LOW (ref 10–24)
BUN: 9 mg/dL (ref 8–27)
CO2: 22 mmol/L (ref 20–29)
Calcium: 9.6 mg/dL (ref 8.6–10.2)
Chloride: 100 mmol/L (ref 96–106)
Creatinine, Ser: 1.12 mg/dL (ref 0.76–1.27)
Glucose: 98 mg/dL (ref 70–99)
Potassium: 4.2 mmol/L (ref 3.5–5.2)
Sodium: 139 mmol/L (ref 134–144)
eGFR: 68 mL/min/{1.73_m2} (ref >59)

## 2022-07-02 NOTE — Patient Instructions (Addendum)
Medication Instructions:   Your physician recommends that you continue on your current medications as directed. Please refer to the Current Medication list given to you today.   *If you need a refill on your cardiac medications before your next appointment, please call your pharmacy*   Lab Work:  BMET TODAY   If you have labs (blood work) drawn today and your tests are completely normal, you will receive your results only by: [place] (if you have MyChart) OR A paper copy in the mail If you have any lab test that is abnormal or we need to change your treatment, we will call you to review the results.   Testing/Procedures: NONE ORDERED  TODAY    Follow-Up: At [facility], you and your health needs are our priority.  As part of our continuing mission to provide you with exceptional heart care, we have created designated Provider Care Teams.  These Care Teams include your primary Cardiologist (physician) and Advanced Practice Providers (APPs -  Physician Assistants and Nurse Practitioners) who all work together to provide you with the care you need, when you need it.  We recommend signing up for the patient portal called "MyChart".  Sign up information is provided on this After Visit Summary.  MyChart is used to connect with patients for Virtual Visits (Telemedicine).  Patients are able to view lab/test results, encounter notes, upcoming appointments, etc.  Non-urgent messages can be sent to your provider as well.   To learn more about what you can do with MyChart, go to [email or web address].    Your next appointment:    1 week(s)  NURSES VISIT WITH EKG  2 WEEKS OFFICE VISIT  RENEE/ TILLARY  The format for your next appointment:   In Person   Other Instructions   Important Information About Sugar

## 2022-07-02 NOTE — Telephone Encounter (Signed)
Spoke Patient  to see how his vital were patient stated his vitals 104/58 HR 91 when he got home. He feeling okay no new complaints but forgot to mentions he did get stung by a wasp and his hand was slightly swollen  but no redness or pain  and that could have been a reason for a spike in his blood pressure. There were no mention of his GI issues also.Patient was told to have a good weekend and thanked me for calling to check in.

## 2022-07-04 ENCOUNTER — Encounter (HOSPITAL_COMMUNITY): Payer: Self-pay

## 2022-07-04 ENCOUNTER — Encounter (HOSPITAL_BASED_OUTPATIENT_CLINIC_OR_DEPARTMENT_OTHER): Payer: Self-pay

## 2022-07-04 ENCOUNTER — Emergency Department (HOSPITAL_BASED_OUTPATIENT_CLINIC_OR_DEPARTMENT_OTHER): Payer: Medicare PPO

## 2022-07-04 ENCOUNTER — Other Ambulatory Visit: Payer: Self-pay

## 2022-07-04 ENCOUNTER — Observation Stay (HOSPITAL_BASED_OUTPATIENT_CLINIC_OR_DEPARTMENT_OTHER)
Admission: EM | Admit: 2022-07-04 | Discharge: 2022-07-07 | Disposition: A | Discharge status: Home / Self Care | Payer: Medicare PPO | Attending: Internal Medicine | Admitting: Internal Medicine

## 2022-07-04 DIAGNOSIS — R600 Localized edema: Secondary | ICD-10-CM | POA: Insufficient documentation

## 2022-07-04 DIAGNOSIS — R0602 Shortness of breath: Secondary | ICD-10-CM | POA: Diagnosis not present

## 2022-07-04 DIAGNOSIS — R079 Chest pain, unspecified: Secondary | ICD-10-CM | POA: Diagnosis present

## 2022-07-04 DIAGNOSIS — R935 Abnormal findings on diagnostic imaging of other abdominal regions, including retroperitoneum: Secondary | ICD-10-CM | POA: Diagnosis not present

## 2022-07-04 DIAGNOSIS — I4892 Unspecified atrial flutter: Secondary | ICD-10-CM | POA: Diagnosis not present

## 2022-07-04 DIAGNOSIS — N182 Chronic kidney disease, stage 2 (mild): Secondary | ICD-10-CM | POA: Diagnosis not present

## 2022-07-04 DIAGNOSIS — Z8673 Personal history of transient ischemic attack (TIA), and cerebral infarction without residual deficits: Secondary | ICD-10-CM | POA: Insufficient documentation

## 2022-07-04 DIAGNOSIS — I4891 Unspecified atrial fibrillation: Secondary | ICD-10-CM | POA: Diagnosis not present

## 2022-07-04 DIAGNOSIS — I129 Hypertensive chronic kidney disease with stage 1 through stage 4 chronic kidney disease, or unspecified chronic kidney disease: Secondary | ICD-10-CM | POA: Insufficient documentation

## 2022-07-04 DIAGNOSIS — Z7901 Long term (current) use of anticoagulants: Secondary | ICD-10-CM | POA: Diagnosis not present

## 2022-07-04 DIAGNOSIS — R0789 Other chest pain: Secondary | ICD-10-CM | POA: Diagnosis not present

## 2022-07-04 DIAGNOSIS — I480 Paroxysmal atrial fibrillation: Secondary | ICD-10-CM | POA: Diagnosis not present

## 2022-07-04 DIAGNOSIS — R609 Edema, unspecified: Secondary | ICD-10-CM

## 2022-07-04 DIAGNOSIS — R000 Tachycardia, unspecified: Secondary | ICD-10-CM | POA: Diagnosis not present

## 2022-07-04 LAB — CBC
HCT: 52.3 % — ABNORMAL HIGH (ref 39.0–52.0)
Hemoglobin: 17 g/dL (ref 13.0–17.0)
MCH: 28.7 pg (ref 26.0–34.0)
MCHC: 32.5 g/dL (ref 30.0–36.0)
MCV: 88.3 fL (ref 80.0–100.0)
Platelets: 170 10*3/uL (ref 150–400)
RBC: 5.92 MIL/uL — ABNORMAL HIGH (ref 4.22–5.81)
RDW: 14 % (ref 11.5–15.5)
WBC: 5.5 10*3/uL (ref 4.0–10.5)
nRBC: 0 % (ref 0.0–0.2)

## 2022-07-04 LAB — BASIC METABOLIC PANEL
Anion gap: 10 (ref 5–15)
BUN: 12 mg/dL (ref 8–23)
CO2: 26 mmol/L (ref 22–32)
Calcium: 9.8 mg/dL (ref 8.9–10.3)
Chloride: 104 mmol/L (ref 98–111)
Creatinine, Ser: 1.3 mg/dL — ABNORMAL HIGH (ref 0.61–1.24)
GFR, Estimated: 57 mL/min — ABNORMAL LOW (ref >60)
Glucose, Bld: 102 mg/dL — ABNORMAL HIGH (ref 70–99)
Potassium: 3.9 mmol/L (ref 3.5–5.1)
Sodium: 140 mmol/L (ref 135–145)

## 2022-07-04 LAB — TROPONIN I (HIGH SENSITIVITY)
Troponin I (High Sensitivity): 10 ng/L (ref <18)
Troponin I (High Sensitivity): 10 ng/L (ref <18)

## 2022-07-04 LAB — TSH: TSH: 1.342 u[IU]/mL (ref 0.350–4.500)

## 2022-07-04 LAB — BRAIN NATRIURETIC PEPTIDE: B Natriuretic Peptide: 109.2 pg/mL — ABNORMAL HIGH (ref 0.0–100.0)

## 2022-07-04 LAB — MAGNESIUM: Magnesium: 1.7 mg/dL (ref 1.7–2.4)

## 2022-07-04 LAB — PHOSPHORUS: Phosphorus: 3.7 mg/dL (ref 2.5–4.6)

## 2022-07-04 MED ORDER — ONDANSETRON HCL 4 MG/2ML IJ SOLN: 4.0000 mg | Freq: Four times a day (QID) | INTRAMUSCULAR | Status: DC | PRN

## 2022-07-04 MED ORDER — PREGABALIN 25 MG PO CAPS
50.0000 mg | ORAL_CAPSULE | Freq: Three times a day (TID) | ORAL | Status: DC
  Administered 2022-07-04 – 2022-07-07 (×8): 50 mg via ORAL
  Filled 2022-07-04 (×8): qty 2

## 2022-07-04 MED ORDER — OXYCODONE-ACETAMINOPHEN 5-325 MG PO TABS
1.0000 | ORAL_TABLET | Freq: Three times a day (TID) | ORAL | Status: DC | PRN
  Administered 2022-07-04 – 2022-07-07 (×4): 1 via ORAL
  Filled 2022-07-04 (×4): qty 1

## 2022-07-04 MED ORDER — POTASSIUM CHLORIDE CRYS ER 20 MEQ PO TBCR
40.0000 meq | EXTENDED_RELEASE_TABLET | Freq: Once | ORAL | Status: AC
  Administered 2022-07-04: 40 meq via ORAL
  Filled 2022-07-04: qty 2

## 2022-07-04 MED ORDER — DOFETILIDE 250 MCG PO CAPS
250.0000 ug | ORAL_CAPSULE | Freq: Two times a day (BID) | ORAL | Status: AC
  Administered 2022-07-04: 250 ug via ORAL
  Filled 2022-07-04: qty 1

## 2022-07-04 MED ORDER — MAGNESIUM OXIDE -MG SUPPLEMENT 400 (240 MG) MG PO TABS
800.0000 mg | ORAL_TABLET | Freq: Once | ORAL | Status: AC
  Administered 2022-07-04: 800 mg via ORAL
  Filled 2022-07-04: qty 2

## 2022-07-04 MED ORDER — DILTIAZEM HCL-DEXTROSE 125-5 MG/125ML-% IV SOLN (PREMIX)
5.0000 mg/h | INTRAVENOUS | Status: DC
  Administered 2022-07-04 – 2022-07-05 (×2): 5 mg/h via INTRAVENOUS
  Filled 2022-07-04 (×2): qty 125

## 2022-07-04 MED ORDER — APIXABAN 5 MG PO TABS
5.0000 mg | ORAL_TABLET | Freq: Two times a day (BID) | ORAL | Status: DC
  Administered 2022-07-04 – 2022-07-07 (×6): 5 mg via ORAL
  Filled 2022-07-04 (×6): qty 1

## 2022-07-04 MED ORDER — FUROSEMIDE 20 MG PO TABS
20.0000 mg | ORAL_TABLET | Freq: Every day | ORAL | Status: DC
  Administered 2022-07-06 – 2022-07-07 (×2): 20 mg via ORAL
  Filled 2022-07-04 (×3): qty 1

## 2022-07-04 MED ORDER — METOPROLOL TARTRATE 25 MG PO TABS: 25.0000 mg | ORAL_TABLET | ORAL | Status: DC | PRN

## 2022-07-04 MED ORDER — METOPROLOL TARTRATE 25 MG PO TABS
25.0000 mg | ORAL_TABLET | Freq: Two times a day (BID) | ORAL | Status: DC | PRN
  Filled 2022-07-04 (×2): qty 1

## 2022-07-04 NOTE — Progress Notes (Addendum)
Pt admitted to [place], VS wnL and as per flow. Pt oriented to 6E processes. Pt familiar with the [facility] system. Afib on telemetry fluctuating from the 80s to 120s. Pt voiced that it's about time for his Oxycodone for his shoulder pain. Admitting provider paged for attending. All questions and concerns addressed. Call bell placed within reach, will continue to monitor and maintain safety.

## 2022-07-04 NOTE — H&P (Signed)
History and Physical    Patrick Roth YSA:[id number] DOB: 1946/10/22 DOA: 07/04/2022  PCP: [name], PA-C (Confirm with patient/family/NH records and if not entered, this has to be entered at [facility] point of entry) Patient coming from: Home  I have personally briefly reviewed patient's old medical records in [place]  Chief Complaint: Palpitations  HPI: Patrick Roth is a 76 y.o. male with medical history significant of PAF status post cardioversion, on Tikosyn and Eliquis, HTN, HLD, CKD stage II, stroke, chronic dyssynergic defecation, presented with chest pain palpitations.  Appears that the patient has been struggling with uncontrolled blood pressure and uncontrolled A-fib for several month, which he thinks is related to his GI problems.  He has a diagnosed dyssynergic defecation and has had difficult constipation, he told me that he especially struggles in the mornings to pass hard stools and causing blood pressure to jump and may have triggered A-fib as well.  He used to take as needed metoprolol for uncontrolled A-fib.  He visited his cardiology 2 days ago who upon knowing his uncontrolled A-fib recommended he come into the hospital.  Patient also told me he was offered PPM at some point, however at this point I did not see any cardiology note mentions PPM.  This afternoon, while in the church, patient started to feel palpitations and pressure-like chest pains, centrally located, associated with shortness of breath.  Denies any lightheadedness vision problem or nauseous vomiting. ED Course: Patient was found in rapid A-fib, blood pressure elevated, Cardizem drip started and Tikosyn restarted in the ED.  Work creatinine 1.3, K3.9.  Review of Systems: As per HPI otherwise 14 point review of systems negative.    Past Medical History:  Diagnosis Date   Atrial fibrillation/flutter    Intol of amio, beta blocker // Echo 12/22: inf HK, EF 50-55, normal RVSF, mild LaE, mild MR,  mod MAC, AV sclerosis w/o aS   Colon polyps    Constipation 05/27/2015   DOE (dyspnea on exertion) 10/13/2015   Faintness 06/20/2016   History of chicken pox    Hyperlipidemia    Hypertension    Internal hemorrhoids    Polycythemia    Sciatica    Stroke ([place])    Hx of stroke in2008; recurrent CVA in 12/2020   Syncope and collapse 06/28/2016    Past Surgical History:  Procedure Laterality Date   CARDIOVERSION N/A 09/02/2017   Procedure: CARDIOVERSION;  Surgeon: [name], MD;  Location: [facility];  Service: Cardiovascular;  Laterality: N/A;   COLONOSCOPY  2011   great toe surgery     PROSTATE BIOPSY       reports that he has never smoked. He has never used smokeless tobacco. He reports that he does not drink alcohol and does not use drugs.  Allergies  Allergen Reactions   Crestor [Rosuvastatin Calcium] Shortness Of Breath    Muscle cramps   Spironolactone Shortness Of Breath   Terazosin Shortness Of Breath, Palpitations and Other (See Comments)    Nerve pain, also   Amiodarone Other (See Comments)    Dropped pulse real low to 45-50, and made him feel very cold   Atenolol Other (See Comments)    Drowsiness and "flu sxs"   Bystolic [Nebivolol Hcl] Other (See Comments)    GI issues   Gabapentin Other (See Comments)    "Did not help with pain"   Hydralazine Other (See Comments)    Joint swelling   Tizanidine Other (See  Comments)    Syncope, Elevated BP/pulse   Clonidine Other (See Comments)    Unknown reaction   Hydrocodone-Acetaminophen Other (See Comments)    Unknown reaction   Levofloxacin Other (See Comments)    Unknown reaction   Losartan Other (See Comments)    Did not work.  Pt states he couldn't take b/c of SE, but can't remember what SE were.   Maxzide [Hydrochlorothiazide W-Triamterene] Other (See Comments)    Does not tolerate potassium-sparing diuretics   Olmesartan Other (See Comments)    Unknown reaction   Tussionex Pennkinetic Er [Hydrocod  Poli-Chlorphe Poli Er] Other (See Comments)    Unknown reaction    Family History  Problem Relation Age of Onset   Hypertension Father 65       Deceased   Heart disease Father    Lung cancer Mother 58       Deceased   Healthy Sister        x3   Healthy Brother        x4   Heart disease Brother        #5   Other Daughter        Alpha Thalassemia   Lupus Daughter        #2-deceased   Diabetes Neg Hx    Heart attack Neg Hx    Hyperlipidemia Neg Hx    Sudden death Neg Hx      Prior to Admission medications   Medication Sig Start Date End Date Taking? Authorizing Provider  amLODipine (NORVASC) 10 MG tablet Take 10 mg by mouth daily.    [provider]  apixaban (ELIQUIS) 5 MG TABS tablet Take 1 tablet by mouth twice daily 02/19/22   Camnitz, Ocie Doyne, MD  dofetilide [place]) 250 MCG capsule Take 1 capsule by mouth twice daily 06/11/22   Fenton, Clint R, PA  furosemide (LASIX) 20 MG tablet Take 1 tablet (20 mg total) by mouth daily. 06/16/22   Baldwin Jamaica, PA-C  metoprolol tartrate (LOPRESSOR) 25 MG tablet Take 25 mg by mouth as needed (if Heart right goes above 125).    [provider]  oxyCODONE-acetaminophen (PERCOCET/ROXICET) 5-325 MG tablet Take 1 tablet by mouth every 8 (eight) hours as needed. 03/18/22   [provider]  polyethylene glycol powder (GLYCOLAX/MIRALAX) 17 GM/SCOOP powder Take 17 g by mouth in the morning and at bedtime. Start with 1 scoop 2 times per day until bowels are moving, then reduce to 1 scoop daily. Drink 64oz of water daily. 09/18/21   Jeanie Sewer, NP  potassium chloride SA (KLOR-CON M) 20 MEQ tablet TAKE 1 TABLET BY MOUTH IN THE MORNING AND TAKE 2 TABLETS IN THE EVENING 05/19/22   Allwardt, Alyssa M, PA-C  pregabalin (LYRICA) 50 MG capsule Take 50 mg by mouth 3 (three) times daily. 04/20/22   [provider]  REPATHA SURECLICK 295 MG/ML SOAJ INJECT 1 PEN INTO THE SKIN EVERY 14 (FOURTEEN) DAYS 01/07/22    Camnitz, Ocie Doyne, MD  Vitamin D, Ergocalciferol, (DRISDOL) 1.25 MG (50000 UNIT) CAPS capsule Take 1 capsule (50,000 Units total) by mouth every 7 (seven) days. 05/10/22   Allwardt, [name], PA-C    Physical Exam: Vitals:   07/04/22 1530 07/04/22 1633 07/04/22 1640 07/04/22 1734  BP: (!) 132/99 122/88 (!) 145/107 (!) 125/91  Pulse: 76 89 94 65  Resp: (!) 23 (!) _0 Temp:  98 F (36.7 C)  97.8 F (36.6 C)  TempSrc:  Oral  Oral  SpO2: 97% 98% 97% 100%  Weight:      Height:        Constitutional: NAD, calm, comfortable Vitals:   07/04/22 1530 07/04/22 1633 07/04/22 1640 07/04/22 1734  BP: (!) 132/99 122/88 (!) 145/107 (!) 125/91  Pulse: 76 89 94 65  Resp: (!) 23 (!) _0 Temp:  98 F (36.7 C)  97.8 F (36.6 C)  TempSrc:  Oral  Oral  SpO2: 97% 98% 97% 100%  Weight:      Height:       Eyes: PERRL, lids and conjunctivae normal ENMT: Mucous membranes are moist. Posterior pharynx clear of any exudate or lesions.Normal dentition.  Neck: normal, supple, no masses, no thyromegaly Respiratory: clear to auscultation bilaterally, no wheezing, no crackles. Normal respiratory effort. No accessory muscle use.  Cardiovascular: Irregular heart rate, no murmurs / rubs / gallops. No extremity edema. 2+ pedal pulses. No carotid bruits.  Abdomen: no tenderness, no masses palpated. No hepatosplenomegaly. Bowel sounds positive.  Musculoskeletal: no clubbing / cyanosis. No joint deformity upper and lower extremities. Good ROM, no contractures. Normal muscle tone.  Skin: no rashes, lesions, ulcers. No induration Neurologic: CN 2-12 grossly intact. Sensation intact, DTR normal. Strength 5/5 in all 4.  Psychiatric: Normal judgment and insight. Alert and oriented x 3. Normal mood.     Labs on Admission: I have personally reviewed following labs and imaging studies  CBC: Recent Labs  Lab 07/04/22 1433  WBC 5.5  HGB 17.0  HCT 52.3*  MCV 88.3  PLT 185   Basic Metabolic  Panel: Recent Labs  Lab 07/02/22 0904 07/04/22 1433 07/04/22 1515  NA 139 140  --   K 4.2 3.9  --   CL 100 104  --   CO2 22 26  --   GLUCOSE 98 102*  --   BUN 9 12  --   CREATININE 1.12 1.30*  --   CALCIUM 9.6 9.8  --   MG  --   --  1.7   GFR: Estimated Creatinine Clearance: 53.3 mL/min (A) (by C-G formula based on SCr of 1.3 mg/dL (H)). Liver Function Tests: No results for input(s): "AST", "ALT", "ALKPHOS", "BILITOT", "PROT", "ALBUMIN" in the last 168 hours. No results for input(s): "LIPASE", "AMYLASE" in the last 168 hours. No results for input(s): "AMMONIA" in the last 168 hours. Coagulation Profile: No results for input(s): "INR", "PROTIME" in the last 168 hours. Cardiac Enzymes: No results for input(s): "CKTOTAL", "CKMB", "CKMBINDEX", "TROPONINI" in the last 168 hours. BNP (last 3 results) No results for input(s): "PROBNP" in the last 8760 hours. HbA1C: No results for input(s): "HGBA1C" in the last 72 hours. CBG: No results for input(s): "GLUCAP" in the last 168 hours. Lipid Profile: No results for input(s): "CHOL", "HDL", "LDLCALC", "TRIG", "CHOLHDL", "LDLDIRECT" in the last 72 hours. Thyroid Function Tests: Recent Labs    07/04/22 1515  TSH 1.342   Anemia Panel: No results for input(s): "VITAMINB12", "FOLATE", "FERRITIN", "TIBC", "IRON", "RETICCTPCT" in the last 72 hours. Urine analysis:    Component Value Date/Time   COLORURINE YELLOW 04/14/2022 1400   APPEARANCEUR CLEAR 04/14/2022 1400   LABSPEC 1.009 04/14/2022 1400   PHURINE 6.5 04/14/2022 1400   GLUCOSEU NEGATIVE 04/14/2022 1400   GLUCOSEU NEGATIVE 05/10/2016 1328   HGBUR NEGATIVE 04/14/2022 1400   BILIRUBINUR NEGATIVE 04/14/2022 1400   BILIRUBINUR neg 04/21/2016 1148   KETONESUR NEGATIVE 04/14/2022 1400   PROTEINUR NEGATIVE 04/14/2022 1400   UROBILINOGEN 0.2 05/10/2016 1328  NITRITE NEGATIVE 04/14/2022 1400   LEUKOCYTESUR NEGATIVE 04/14/2022 1400    Radiological Exams on Admission: DG Chest  Portable 1 View  Result Date: 07/04/2022 CLINICAL DATA:  Shortness of breath, tachycardia EXAM: PORTABLE CHEST 1 VIEW COMPARISON:  03/03/2022 FINDINGS: Transverse diameter of heart is slightly increased. There are no signs of pulmonary edema or focal pulmonary consolidation. There is no significant pleural effusion or pneumothorax. IMPRESSION: No active disease. Electronically Signed   By: Elmer Picker M.D.   On: 07/04/2022 15:01    EKG: Independently reviewed.  A-fib with RVR  Assessment/Plan Principal Problem:   Atrial fibrillation with RVR (HCC) Active Problems:   Chest pain  (please populate well all problems here in Problem List. (For example, if patient is on BP meds at home and you resume or decide to hold them, it is a problem that needs to be her. Same for CAD, COPD, HLD and so on)  A-fib with RVR -On Cardizem drip -Reviewed patient case appears that patient was on Cardizem and Tikosyn for rate control however Cardizem was dropped by cardiology at some point this year, patient however cannot provide me with any details.  Appears that patient has been on as needed metoprolol at home for breakthrough A-fib (usually only feels heart rate> 160) which again patient has had several episodes of breakthrough A-fib at home due to having trouble with bowel movements. -He does have a abdomen CT scan in May this year showed likely a chronic calcification/bone in the hepatic flexure:.  He went to discuss his usual with his GI doctor who recommended sigmoidoscope however is yet to be done.  And patient continues to experience severe constipation/difficulty with bowel movement, and he attributes that very much to his uncontrolled A-fib.  Given to see cardiology 2 days ago recommend he come in to ED for further work-up however he declined at that point. -1 dose of p.o. KCl given, magnesium= 1.7, will give one-time 800 mg magnesium oxide.  Check phosphorus level.  Question of tachybradycardia  syndrome -Review of patient's telemetry record showed patient does have in and out of A-fib and questions are some episodes of sinus rhythm showed dropped QRS after P wave, suspect high-grade of AV block.  However I do not see cardiologist's records show he has AV block but patient does report he was offered PPM on several occasions. -In this case, I wonder whether Cardizem drip is appropriate for this patient.  Place a paging to cardiology on-call fellow and waiting for reply. Also secure texted him.  Chronic constipation and abdominal pain -Probably related to narcotic use, I offered him Nalogel, he preferred to have a conversation with his GI doctor before making decision.  HTN -Controlled right now, on Cardizem drip, hold off amlodipine for now.  CKD stage II -Euvolemic, creatinine level stable.    DVT prophylaxis: Eliquis Code Status: Full code Family Communication: None at bedside Disposition Plan: Depends on patient heart rate control, may stay more than 1 midnight Consults called: Paged and secure text cardiology fellow Admission status: PCU   Lequita Halt MD [facility] Pager [phone]  07/04/2022, 6:56 PM

## 2022-07-04 NOTE — ED Provider Notes (Signed)
[facility] EMERGENCY DEPT Provider Note   CSN: [id number] Arrival date & time: 07/04/22  1416     History  Chief Complaint  Patient presents with   Hypertension    Patrick Roth is a 76 y.o. male with a past medical history of hypertension, A-fib on Eliquis, and stroke presenting to the emergency department for concerns of chest pain.  Patient reports that this chest pain started while he was in the kitchen today sitting down.  He denies any any radiation of this pain.  Patient reports that he then checked his blood pressure and it was elevated in the 180s to 190s.  He reports that his pulse was up in the 180s.  He denies shortness of breath, feeling clammy, diaphoresis, weakness, or nausea or vomiting.  He reports that this pain is a pressure in the middle of his chest that does not radiate anywhere.  He also reports that he has been having right leg tingling for the past few weeks and months.  Patient also reports having worsening leg swelling.  He reports that his teeth started chattering after this chest pressure.  Patient reports that he was not straining at the time of onset.  Hypertension Associated symptoms include chest pain. Pertinent negatives include no abdominal pain and no shortness of breath.       Home Medications Prior to Admission medications   Medication Sig Start Date End Date Taking? Authorizing Provider  amLODipine (NORVASC) 10 MG tablet Take 10 mg by mouth daily.    [provider]  apixaban (ELIQUIS) 5 MG TABS tablet Take 1 tablet by mouth twice daily 02/19/22   Camnitz, Ocie Doyne, MD  dofetilide [facility]) 250 MCG capsule Take 1 capsule by mouth twice daily 06/11/22   Fenton, Clint R, PA  furosemide (LASIX) 20 MG tablet Take 1 tablet (20 mg total) by mouth daily. 06/16/22   Baldwin Jamaica, PA-C  metoprolol tartrate (LOPRESSOR) 25 MG tablet Take 25 mg by mouth as needed (if Heart right goes above 125).    [provider]   oxyCODONE-acetaminophen (PERCOCET/ROXICET) 5-325 MG tablet Take 1 tablet by mouth every 8 (eight) hours as needed. 03/18/22   [provider]  polyethylene glycol powder (GLYCOLAX/MIRALAX) 17 GM/SCOOP powder Take 17 g by mouth in the morning and at bedtime. Start with 1 scoop 2 times per day until bowels are moving, then reduce to 1 scoop daily. Drink 64oz of water daily. 09/18/21   Jeanie Sewer, NP  potassium chloride SA (KLOR-CON M) 20 MEQ tablet TAKE 1 TABLET BY MOUTH IN THE MORNING AND TAKE 2 TABLETS IN THE EVENING 05/19/22   Allwardt, Alyssa M, PA-C  pregabalin (LYRICA) 50 MG capsule Take 50 mg by mouth 3 (three) times daily. 04/20/22   [provider]  REPATHA SURECLICK 875 MG/ML SOAJ INJECT 1 PEN INTO THE SKIN EVERY 14 (FOURTEEN) DAYS 01/07/22   Camnitz, Ocie Doyne, MD  Vitamin D, Ergocalciferol, (DRISDOL) 1.25 MG (50000 UNIT) CAPS capsule Take 1 capsule (50,000 Units total) by mouth every 7 (seven) days. 05/10/22   Allwardt, [name], PA-C      Allergies    Crestor [rosuvastatin calcium], Spironolactone, Terazosin, Amiodarone, Atenolol, Bystolic [nebivolol hcl], Gabapentin, Hydralazine, Tizanidine, Clonidine, Hydrocodone-acetaminophen, Levofloxacin, Losartan, Maxzide [hydrochlorothiazide w-triamterene], Olmesartan, and Tussionex pennkinetic er [hydrocod poli-chlorphe poli er]    Review of Systems   Review of Systems  Constitutional:  Negative for chills and fever.  Respiratory:  Positive for chest tightness. Negative for shortness of  breath.   Cardiovascular:  Positive for chest pain and leg swelling. Negative for palpitations.  Gastrointestinal:  Negative for abdominal pain, diarrhea, nausea and vomiting.  Neurological:  Negative for dizziness, syncope and light-headedness.    Physical Exam Updated Vital Signs BP (!) 147/113   Pulse 91   Temp 98.4 F (36.9 C)   Resp (!) 21   Ht '5\' 8"'$  (1.727 m)   Wt 95.3 kg   SpO2 99%   BMI 31.95 kg/m  Physical  Exam  General: Alert and orientated x3. Patient is resting comfortably in bed in no acute distress  Eyes: EOM intact  Head: Normocephalic, atraumatic  Neck: Supple, nontender, full range of motion, No JVD Cardio: Irregularly irregular rhythm noticed with elevated heart rate at 120.  2+ pulses noted to bilateral upper and lower extremities. Chest: No chest tenderness Pulmonary: Clear to ausculation bilaterally with no rales, rhonchi, and crackles  Abdomen: Soft, nontender with normoactive bowel sounds with no rebound or guarding  Neuro: CN II-XII intact. Sensation intact to upper and lower extremities Skin: No rashes noted  MSK: 5/5 strength to upper and lower extremities.   ED Results / Procedures / Treatments   Labs (all labs ordered are listed, but only abnormal results are displayed) Labs Reviewed  BASIC METABOLIC PANEL - Abnormal; Notable for the following components:      Result Value   Glucose, Bld 102 (*)    Creatinine, Ser 1.30 (*)    GFR, Estimated 57 (*)    All other components within normal limits  CBC - Abnormal; Notable for the following components:   RBC 5.92 (*)    HCT 52.3 (*)    All other components within normal limits  MAGNESIUM  TSH  BRAIN NATRIURETIC PEPTIDE  TROPONIN I (HIGH SENSITIVITY)    EKG EKG Interpretation  Date/Time:  Sunday July 04 2022 14:27:53 EDT Ventricular Rate:  116 PR Interval:    QRS Duration: 126 QT Interval:  314 QTC Calculation: 436 R Axis:   -67 Text Interpretation: Atrial fibrillation with rapid ventricular response with premature ventricular or aberrantly conducted complexes Left axis deviation Right bundle branch block Inferior infarct (cited on or before 11-Mar-2022) Anterior infarct (cited on or before 11-Mar-2022) Abnormal ECG When compared with ECG of 11-Mar-2022 10:34, Atrial fibrillation has replaced Sinus rhythm Vent. rate has increased BY  57 BPM Questionable change in initial forces of Anterior leads Nonspecific T  wave abnormality no longer evident in Inferior leads appears to show aflutter with RVR. No STEMI Confirmed by Antony Blackbird [phone]) on 07/04/2022 3:02:46 PM  Radiology DG Chest Portable 1 View  Result Date: 07/04/2022 CLINICAL DATA:  Shortness of breath, tachycardia EXAM: PORTABLE CHEST 1 VIEW COMPARISON:  03/03/2022 FINDINGS: Transverse diameter of heart is slightly increased. There are no signs of pulmonary edema or focal pulmonary consolidation. There is no significant pleural effusion or pneumothorax. IMPRESSION: No active disease. Electronically Signed   By: Elmer Picker M.D.   On: 07/04/2022 15:01    Procedures Procedures    Medications Ordered in ED Medications  diltiazem (CARDIZEM) 125 mg in dextrose 5% 125 mL (1 mg/mL) infusion (has no administration in time range)    ED Course/ Medical Decision Making/ A&P                           Medical Decision Making This is a 76 year old male with a past medical history of hypertension and A-fib on Eliquis presenting  to the emergency department with concerns of chest discomfort.  Patient reports that earlier today, he was sitting in the kitchen and developed this chest discomfort.  He then checked his blood pressure and it was significantly elevated.  Patient is also have been having worsening leg edema.  He also reports an elevated heart rate.  Initial presentation in the ED showing elevated blood pressure in the 150s and elevated heart rate in the 120s.  On exam, patient is fluid overloaded with a low irregularly irregular heart rate.  Initial EKG showing A-fib with RVR.  Given history and exam, patient is having A-fib that could be contributing to his symptoms.  Patient is currently not hypoxic and looks comfortable, but low threshold for admission.  We will obtain mag, CBC, CMP, TSH, and given current A-fib with RVR, patient will be started on diltiazem drip.  Patient will be transferred to oncoming provider.  Amount and/or Complexity  of Data Reviewed Labs: ordered. Radiology: ordered.           Final Clinical Impression(s) / ED Diagnoses Final diagnoses:  Atrial fibrillation with RVR ([place])  Chest pain, unspecified type  Peripheral edema    Rx / DC Orders ED Discharge Orders     None         Leigh Aurora, DO 07/04/22 1532    Tegeler, Gwenyth Allegra, MD 07/05/22 [phone]

## 2022-07-04 NOTE — ED Notes (Signed)
Report given to floor. [facility] is transporting Pt.

## 2022-07-04 NOTE — Plan of Care (Signed)
[name] is a 76 year old male with pmh HTN, HLD, PAF on Eliquis, CVA, and syncope who presented with complaints of chest pain found to be in A-fib with RVR.  Initial high-sensitivity troponin negative.  Patient reported compliance with Eliquis and started on a Cardizem drip.  Patient recommended for further work-up and possible echocardiogram.  Accepted as observation to bed as patient on Cardizem drip.

## 2022-07-04 NOTE — ED Provider Notes (Signed)
  Physical Exam  BP (!) 132/99   Pulse 76   Temp 98.4 F (36.9 C)   Resp (!) 23   Ht 1.727 m ('5\' 8"'$ )   Wt 95.3 kg   SpO2 97%   BMI 31.95 kg/m   Physical Exam  Procedures  Procedures  ED Course / MDM    Medical Decision Making Amount and/or Complexity of Data Reviewed Labs: ordered. Radiology: ordered.  Risk Prescription drug management. Decision regarding hospitalization.   76 yo male with afib on eliquis presents with increased weakness, here hr 100-140s, had chest pain earlier. Here with leg edema Patient with intermittent a fib rvr and volume overload. Diltiazem drip ordered.        Pattricia Boss, MD 07/06/22 1253

## 2022-07-04 NOTE — ED Triage Notes (Signed)
Patient here POV from Home.  Patient Endorses No Symptoms Yesterday or this AM but shortly PTA (<30 minutes) the Patient noted an Increase in HR and assessed BP which displayed Systolic BP as high as 834, 190.   Currently Shaky.  NAD Noted during Triage. A&Ox4. GCS 15. BIB Wheelchair.

## 2022-07-05 ENCOUNTER — Observation Stay (HOSPITAL_BASED_OUTPATIENT_CLINIC_OR_DEPARTMENT_OTHER): Payer: Medicare PPO

## 2022-07-05 ENCOUNTER — Observation Stay (HOSPITAL_COMMUNITY): Payer: Medicare PPO

## 2022-07-05 DIAGNOSIS — Z0389 Encounter for observation for other suspected diseases and conditions ruled out: Secondary | ICD-10-CM | POA: Diagnosis not present

## 2022-07-05 DIAGNOSIS — I4891 Unspecified atrial fibrillation: Secondary | ICD-10-CM | POA: Diagnosis not present

## 2022-07-05 DIAGNOSIS — R079 Chest pain, unspecified: Secondary | ICD-10-CM | POA: Diagnosis not present

## 2022-07-05 LAB — MAGNESIUM: Magnesium: 1.9 mg/dL (ref 1.7–2.4)

## 2022-07-05 LAB — BASIC METABOLIC PANEL
Anion gap: 8 (ref 5–15)
BUN: 9 mg/dL (ref 8–23)
CO2: 26 mmol/L (ref 22–32)
Calcium: 9.2 mg/dL (ref 8.9–10.3)
Chloride: 105 mmol/L (ref 98–111)
Creatinine, Ser: 1.15 mg/dL (ref 0.61–1.24)
GFR, Estimated: >60 mL/min (ref >60)
Glucose, Bld: 97 mg/dL (ref 70–99)
Potassium: 3.9 mmol/L (ref 3.5–5.1)
Sodium: 139 mmol/L (ref 135–145)

## 2022-07-05 LAB — ECHOCARDIOGRAM COMPLETE
Height: 68 in
S' Lateral: 2.9 cm
Weight: 3361.57 oz

## 2022-07-05 MED ORDER — PERFLUTREN LIPID MICROSPHERE
1.0000 mL | INTRAVENOUS | Status: DC | PRN
  Administered 2022-07-05: 2 mL via INTRAVENOUS

## 2022-07-05 MED ORDER — POTASSIUM CHLORIDE CRYS ER 20 MEQ PO TBCR
40.0000 meq | EXTENDED_RELEASE_TABLET | Freq: Once | ORAL | Status: AC
  Administered 2022-07-05: 40 meq via ORAL
  Filled 2022-07-05: qty 2

## 2022-07-05 MED ORDER — MAGNESIUM SULFATE 2 GM/50ML IV SOLN
2.0000 g | Freq: Once | INTRAVENOUS | Status: AC
  Administered 2022-07-05: 2 g via INTRAVENOUS
  Filled 2022-07-05: qty 50

## 2022-07-05 MED ORDER — DOFETILIDE 250 MCG PO CAPS
250.0000 ug | ORAL_CAPSULE | Freq: Two times a day (BID) | ORAL | Status: DC
  Administered 2022-07-05 – 2022-07-07 (×4): 250 ug via ORAL
  Filled 2022-07-05 (×4): qty 1

## 2022-07-05 MED ORDER — SODIUM CHLORIDE 0.9 % IV SOLN: INTRAVENOUS | Status: DC

## 2022-07-05 NOTE — Progress Notes (Signed)
  Progress Note   Patient: Patrick Roth TDV:[id number] DOB: 12/13/1945 DOA: 07/04/2022     0 DOS: the patient was seen and examined on 07/05/2022   Brief hospital course: 76 y.o. male with medical history significant of PAF status post cardioversion, on Tikosyn and Eliquis, HTN, HLD, CKD stage II, stroke, chronic dyssynergic defecation, presented with chest pain palpitations.  8/14- EP to see today, plan to restart tikosyn, will f/u recommendations ?PPM placement  Assessment and Plan: #A-fib with RVR - currently on Cardizem gtt - previously on Cardizem and Tikosyn for rate control however Cardizem was dropped by cardiology at some point this year,  - Appears that patient has been on as needed metoprolol at home for breakthrough A-fib (usually only feels heart rate> 160) which again patient has had several episodes of breakthrough A-fib at home due to having trouble with bowel movements. Will request EP evaluation for further characterization of his tachyarrythmias/tachybradycardic syndrome    #Chronic Calcification Hepatic Flexure - He does have a abdomen CT scan in May this year showed likely a chronic calcification/bone in the hepatic flexure:.  He went to discuss his usual with his GI doctor who recommended sigmoidoscope however is yet to be done.  And patient continues to experience severe constipation/difficulty with bowel movement, and he attributes that very much to his uncontrolled A-fib - work on constipation as below  #Chronic constipation and abdominal pain -Probably related to narcotic use, I offered him Nalogel, he preferred to have a conversation with his GI doctor before making decision.   #HTN -Controlled right now, on Cardizem drip, hold off amlodipine for now.   #CKD stage II -Euvolemic, creatinine level stable.       Subjective:    Physical Exam: Vitals:   07/05/22 0412 07/05/22 0830 07/05/22 0900 07/05/22 0931  BP: (!) 148/99 123/87 131/85 118/87  Pulse: 88  93    Resp: 17     Temp: 97.8 F (36.6 C) 97.9 F (36.6 C)    TempSrc: Oral Oral    SpO2: 100% 100%    Weight:      Height:      Eyes: PERRL, lids and conjunctivae normal ENMT: Mucous membranes are moist. Posterior pharynx clear of any exudate or lesions.Normal dentition.  Neck: normal, supple, no masses, no thyromegaly Respiratory: clear to auscultation bilaterally, no wheezing, no crackles. Normal respiratory effort. No accessory muscle use.  Cardiovascular: Irregular heart rate, no murmurs / rubs / gallops. No extremity edema. 2+ pedal pulses. No carotid bruits.  Abdomen: no tenderness, no masses palpated. No hepatosplenomegaly. Bowel sounds positive.  No pulsatile masses Musculoskeletal: no clubbing / cyanosis. No joint deformity upper and lower extremities. Good ROM, no contractures. Normal muscle tone.  Skin: no rashes, lesions, ulcers. No induration Neurologic: CN 2-12 grossly intact. Sensation intact, DTR normal. Strength 5/5 in all 4.  Psychiatric: Normal judgment and insight. Alert and oriented x 3. Normal mood.     Data Reviewed:  There are no new results to review at this time.  Family Communication: none available  Disposition: Status is: Observation The patient remains OBS appropriate and will d/c before 2 midnights.  Planned Discharge Destination: Home    Time spent: 25 minutes  Author: Vanna Scotland, MD 07/05/2022 11:10 AM  For on call review [email or web address].

## 2022-07-05 NOTE — Care Management Obs Status (Signed)
MEDICARE OBSERVATION STATUS NOTIFICATION   Patient Details  Name: Patrick Roth MRN: [id number] Date of Birth: 10-31-1946   Medicare Observation Status Notification Given:  Yes    Zenon Mayo, RN 07/05/2022, 3:07 PM

## 2022-07-05 NOTE — H&P (View-Only) (Signed)
Cardiology Consultation:   Patient ID: Patrick Roth MRN: [id number]; DOB: [birth year]  Admit date: 07/04/2022 Date of Consult: 07/05/2022  PCP:  Patrick Lathe, PA-C   [facility] Providers Cardiologist:  Patrick Moores, MD  Electrophysiologist:  Patrick Haw, MD  {   Patient Profile:   Patrick Roth is a 76 y.o. male with a hx of  who is being seen 07/05/2022 for the evaluation of AFib, HTN, CVA,  HLD, syncope attributed to orthostasis in 2017, h/o medication non complinace, another stroke Feb 2022 (compliant with his [place] at the time)  >> polycythemia, RBBB at the request of Patrick Roth.   AFib Hx Diagnosed 2017   AAD hx Started amiodarone March 2019 >> seems self stopped sometime in the Fall 2021 2/2 making him feel cold and lowered his pulse Toprol was stopped by the patient making him feel bad, poorly tolerated Tikosyn started Dec 2022  History of Present Illness:   Patrick Roth was seen by myself recently a couple of times, most recently 07/02/22, he was in rapid Aflutter 170s, hemodynamically though with stable BP and tolerating well.  He could tell he was going fast, denies symptoms other then awareness of it, and reported that mornings were traditionally worse for him because his GO issues were the worst in the AMs and this always provokes his arrhythmia and fast rates. He remained enthusiastically pleased with his Tikosyn and improvement in rhythm control. Even though the last time is aw him only weeks before he was in a rate controlled Aflutter. He was recommended last week to go to the ER but he declined, felt sure that after home and able to have a BM, take his meds, his HR would improve. When we reached out to him that afternoon, he felt well and reported his HRs in the 94'H  He is certain that his AF/RVR is provoked by his GI issues.  He sought attention yesterday at the ER for high BP and high HRs with some chest pressure and R leg tingling (this a complaint  for weeks-months) He was transferred to [facility] for admission and management. Started on a dilt gtt, home Tikosyn continued  LABS K+ 3.9 Mag 1.7 (given 867m of mag ox) BUN/Creat 12/1.30 BNP 109 HS Trop 10 > 10 WBC 5.5 H/H 17/52 Plts 170 TSH 1.342   Dilt gtt at 5 He got tikosyn last night but not this AM (stopped)  He reports that he went home from out visit did have a BM and did have improvement in his HR, he also noted that he ha gotten stung by a wasp and thinks that contributed to his HR as well. He did fine until yesterday, started feeling a little weak, heavy in the chest checked his BP and BP and HR were elevated and went to the ER    Past Medical History:  Diagnosis Date   Atrial fibrillation/flutter    Intol of amio, beta blocker // Echo 12/22: inf HK, EF 50-55, normal RVSF, mild LaE, mild MR, mod MAC, AV sclerosis w/o aS   Colon polyps    Constipation 05/27/2015   DOE (dyspnea on exertion) 10/13/2015   Faintness 06/20/2016   History of chicken pox    Hyperlipidemia    Hypertension    Internal hemorrhoids    Polycythemia    Sciatica    Stroke ([place]    Hx of stroke in2008; recurrent CVA in 12/2020   Syncope and collapse 06/28/2016    Past  Surgical History:  Procedure Laterality Date   CARDIOVERSION N/A 09/02/2017   Procedure: CARDIOVERSION;  Surgeon: Patrick Casino, MD;  Location: [facility] ENDOSCOPY;  Service: Cardiovascular;  Laterality: N/A;   COLONOSCOPY  2011   great toe surgery     PROSTATE BIOPSY       Home Medications:  Prior to Admission medications   Medication Sig Start Date End Date Taking? Authorizing Provider  amLODipine (NORVASC) 10 MG tablet Take 10 mg by mouth daily.   Yes [provider]  apixaban (ELIQUIS) 5 MG TABS tablet Take 1 tablet by mouth twice daily Patient taking differently: Take 5 mg by mouth 2 (two) times daily. 02/19/22  Yes Roth, Patrick Hassell Done, MD  dofetilide [facility]) 250 MCG capsule Take 1 capsule by mouth twice daily  06/11/22  Yes Roth, Patrick R, PA  furosemide (LASIX) 20 MG tablet Take 1 tablet (20 mg total) by mouth daily. 06/16/22  Yes Patrick Jamaica, PA-C  metoprolol tartrate (LOPRESSOR) 25 MG tablet Take 25 mg by mouth as needed (if Heart right goes above 125).   Yes [provider]  oxyCODONE-acetaminophen (PERCOCET/ROXICET) 5-325 MG tablet Take 1 tablet by mouth every 8 (eight) hours as needed for moderate pain. 03/18/22  Yes [provider]  polyethylene glycol powder (GLYCOLAX/MIRALAX) 17 GM/SCOOP powder Take 17 g by mouth in the morning and at bedtime. Start with 1 scoop 2 times per day until bowels are moving, then reduce to 1 scoop daily. Drink 64oz of water daily. Patient taking differently: Take 17 g by mouth daily as needed for mild constipation. 09/18/21  Yes Patrick, Colletta Maryland, NP  potassium chloride SA (KLOR-CON Roth) 20 MEQ tablet TAKE 1 TABLET BY MOUTH IN THE MORNING AND TAKE 2 TABLETS IN THE EVENING Patient taking differently: Take 20 mEq by mouth 3 (three) times daily. 05/19/22  Yes Roth, Patrick M, PA-C  pregabalin (LYRICA) 50 MG capsule Take 50 mg by mouth 3 (three) times daily. 04/20/22  Yes [provider]  REPATHA SURECLICK 093 MG/ML SOAJ INJECT 1 PEN INTO THE SKIN EVERY 14 (FOURTEEN) DAYS Patient taking differently: Inject 140 mg into the skin every 14 (fourteen) days. 01/07/22  Yes Roth, Patrick Doyne, MD  Vitamin D, Ergocalciferol, (DRISDOL) 1.25 MG (50000 UNIT) CAPS capsule Take 1 capsule (50,000 Units total) by mouth every 7 (seven) days. Patient taking differently: Take 50,000 Units by mouth every 7 (seven) days. Wednesday 05/10/22  Yes Roth, [name], PA-C    Inpatient Medications: Scheduled Meds:  apixaban  5 mg Oral BID   furosemide  20 mg Oral Daily   pregabalin  50 mg Oral TID   Continuous Infusions:  diltiazem (CARDIZEM) infusion 5 mg/hr (07/04/22 1546)   PRN Meds: metoprolol tartrate, ondansetron (ZOFRAN) IV,  oxyCODONE-acetaminophen  Allergies:    Allergies  Allergen Reactions   Crestor [Rosuvastatin Calcium] Shortness Of Breath    Muscle cramps   Spironolactone Shortness Of Breath   Terazosin Shortness Of Breath, Palpitations and Other (See Comments)    Nerve pain, also   Amiodarone Other (See Comments)    Dropped pulse real low to 45-50, and made him feel very cold   Atenolol Other (See Comments)    Drowsiness and "flu sxs"   Bystolic [Nebivolol Hcl] Other (See Comments)    GI issues   Gabapentin Other (See Comments)    "Did not help with pain"   Hydralazine Other (See Comments)    Joint swelling   Tizanidine Other (See Comments)  Syncope, Elevated BP/pulse   Clonidine Other (See Comments)    Unknown reaction   Hydrocodone-Acetaminophen Other (See Comments)    Unknown reaction   Levofloxacin Other (See Comments)    Unknown reaction   Losartan Other (See Comments)    Did not work.  Pt states he couldn't take b/c of SE, but can't remember what SE were.   Maxzide [Hydrochlorothiazide W-Triamterene] Other (See Comments)    Does not tolerate potassium-sparing diuretics   Olmesartan Other (See Comments)    Unknown reaction   Tussionex Pennkinetic Er [Hydrocod Poli-Chlorphe Poli Er] Other (See Comments)    Unknown reaction    Social History:   Social History   Socioeconomic History   Marital status: Married    Spouse name: [name]   Number of children: 1   Years of education: 12   Highest education level: Not on file  Occupational History   Occupation: Retired  Tobacco Use   Smoking status: Never   Smokeless tobacco: Never   Tobacco comments:    Never smoke 03/11/22  Vaping Use   Vaping Use: Never used  Substance and Sexual Activity   Alcohol use: No   Drug use: No   Sexual activity: Not on file  Other Topics Concern   Not on file  Social History Narrative   Lives with wife   Right Handed   Drinks no caffeine   Social Determinants of Health   Financial  Resource Strain: Low Risk  (10/12/2021)   Overall Financial Resource Strain (CARDIA)    Difficulty of Paying Living Expenses: Not hard at all  Food Insecurity: No Food Insecurity (10/12/2021)   Hunger Vital Sign    Worried About Running Out of Food in the Last Year: Never true    [place] in the Last Year: Never true  Transportation Needs: No Transportation Needs (10/12/2021)   PRAPARE - Hydrologist (Medical): No    Lack of Transportation (Non-Medical): No  Physical Activity: Inactive (10/12/2021)   Exercise Vital Sign    Days of Exercise per Week: 0 days    Minutes of Exercise per Session: 0 min  Stress: No Stress Concern Present (10/12/2021)   [place]    Feeling of Stress : Not at all  Social Connections: Moderately Isolated (10/12/2021)   Social Connection and Isolation Panel [NHANES]    Frequency of Communication with Friends and Family: More than three times a week    Frequency of Social Gatherings with Friends and Family: More than three times a week    Attends Religious Services: Never    Marine scientist or Organizations: No    Attends Archivist Meetings: Never    Marital Status: Married  Human resources officer Violence: Not At Risk (10/12/2021)   Humiliation, Afraid, Rape, and Kick questionnaire    Fear of Current or Ex-Partner: No    Emotionally Abused: No    Physically Abused: No    Sexually Abused: No    Family History:   Family History  Problem Relation Age of Onset   Hypertension Father 53       Deceased   Heart disease Father    Lung cancer Mother 49       Deceased   Healthy Sister        x3   Healthy Brother        x4   Heart disease Brother        #  5   Other Daughter        Alpha Thalassemia   Lupus Daughter        #2-deceased   Diabetes Neg Hx    Heart attack Neg Hx    Hyperlipidemia Neg Hx    Sudden death Neg Hx      ROS:   Please see the history of present illness.  All other ROS reviewed and negative.     Physical Exam/Data:   Vitals:   07/05/22 0412 07/05/22 0830 07/05/22 0900 07/05/22 0931  BP: (!) 148/99 123/87 131/85 118/87  Pulse: 88 93    Resp: 17     Temp: 97.8 F (36.6 C) 97.9 F (36.6 C)    TempSrc: Oral Oral    SpO2: 100% 100%    Weight:      Height:        Intake/Output Summary (Last 24 hours) at 07/05/2022 1104 Last data filed at 07/05/2022 0636 Gross per 24 hour  Intake 240 ml  Output 1825 ml  Net -1585 ml      07/04/2022    2:23 PM 07/02/2022    8:00 AM 06/16/2022    8:12 AM  Last 3 Weights  Weight (lbs) 210 lb 1.6 oz 210 lb 211 lb 3.2 oz  Weight (kg) 95.3 kg 95.255 kg 95.8 kg     Body mass index is 31.95 kg/Roth.  General:  Well nourished, well developed, in no acute distress HEENT: normal Neck: no JVD Vascular: No carotid bruits; Distal pulses 2+ bilaterally Cardiac:  RRR; no murmurs, gallops or rubs Lungs:  CTA b/l, no wheezing, rhonchi or rales  Abd: soft, nontender  Ext: no edema Musculoskeletal:  No deformities Skin: warm and dry  Neuro:  no focal abnormalities noted Psych:  Normal affect   EKG:  The EKG was personally reviewed and demonstrates:   AFlutter Telemetry:  Telemetry was personally reviewed and demonstrates:   Remains in Aflutter 90's  Relevant CV Studies:  10/31/21: TTE IMPRESSIONS   1. Inferior basal hypokinesis . Left ventricular ejection fraction, by  estimation, is 50 to 55%. The left ventricle has low normal function. The  left ventricle demonstrates regional wall motion abnormalities (see  scoring diagram/findings for  description). There is mild left ventricular hypertrophy. Left ventricular  diastolic parameters were normal.   2. Right ventricular systolic function is normal. The right ventricular  size is normal. There is normal pulmonary artery systolic pressure.   3. Left atrial size was mildly dilated.   4. The mitral valve is  degenerative. Mild mitral valve regurgitation. No  evidence of mitral stenosis. Moderate mitral annular calcification.   5. The aortic valve is tricuspid. Aortic valve regurgitation is not  visualized. Aortic valve sclerosis/calcification is present, without any  evidence of aortic stenosis.   6. The inferior vena cava is normal in size with greater than 50%  respiratory variability, suggesting right atrial pressure of 3 mmHg.    Nov 2022, monitor (7 days) Predominant rhythm was sinus rhythm 1% ventricular ectopy 6.2% supraventricular ectopy Multiple SVT episodes, all less than 20 seconds No symptoms recorded     Nov 2021 Monitoring Max 215 bpm 02:28pm, 10/28 Min 82 bpm 11:16am, 10/28 Avg 112 bpm 3.3% PVC burden, rare supraventricular ectopy Atrial fibrillation/flutter occurred 100% of the time Longest ventricular run 10 seconds at 161 bpm No symptoms recorded  02/24/2018: stress myoview Nuclear stress EF: 58%. Normal perfusion. No ischemia This is a low risk study.  02/24/2018: TTE Study Conclusions  - Left ventricle: The cavity size was normal. Systolic function was    normal. The estimated ejection fraction was in the range of 55%    to 60%. Wall motion was normal; there were no regional wall    motion abnormalities. There was a reduced contribution of atrial    contraction to ventricular filling, due to increased ventricular    diastolic pressure or atrial contractile dysfunction. The study    is not technically sufficient to allow evaluation of LV diastolic    function. (suspect left atrial mechanical failure, possible    stunning after recent episode of atrial fibrillation versus    chronic atrial dysfunction)  - Left atrium: The atrium was mildly dilated.  - Right ventricle: The cavity size was mildly dilated. Wall    thickness was normal.  - Right atrium: The atrium was mildly dilated.  - Pulmonary arteries: Systolic pressure was moderately increased.    PA  peak pressure: 60 mm Hg (S).    6./15/18: 48 hor holter Minimum HR: 37 BPM at 12:18:54 PM Maximum HR: 124 BPM at 7:11:26 PM Average HR: 50 BPM 1% ventricular beats 4% supraventricular beats Ventricular runs of 6 beats Supraventricular runs of 13 beats   Zero atrial fibrillaiton noted on monitor. Average HR 50. No med changes at this time.  Patrick [name], MD   10/01/16 TTE - Left ventricle: The cavity size was normal. Systolic function was   normal. The estimated ejection fraction was in the range of 55%   to 60%. Wall motion was normal; there were no regional wall   motion abnormalities. Doppler parameters are consistent with a   reversible restrictive pattern, indicative of decreased left   ventricular diastolic compliance and/or increased left atrial   pressure (grade 3 diastolic dysfunction). Doppler parameters are   consistent with high ventricular filling pressure. - Aortic valve: Transvalvular velocity was within the normal range.   There was no stenosis. - Mitral valve: Transvalvular velocity was within the normal range.   There was no evidence for stenosis. There was mild regurgitation. - Right ventricle: The cavity size was mildly dilated. Wall   thickness was normal. Systolic function was normal. - Atrial septum: No defect or patent foramen ovale was identified   by color flow Doppler. - Tricuspid valve: There was mild regurgitation. - Pulmonary arteries: Systolic pressure was moderately to severely   increased. PA peak pressure: 57 mm Hg (S). - Pericardium, extracardiac: A trivial pericardial effusion was   identified.   10/10/15: lexiscan stress myoview There was no ST segment deviation noted during stress. No T wave inversion was noted during stress. The study is normal. This is a low risk study.  Low risk stress nuclear study with normal perfusion. Non gated study.  Laboratory Data:  High Sensitivity Troponin:   Recent Labs  Lab 07/04/22 1433  07/04/22 1734  TROPONINIHS 10 10     Chemistry Recent Labs  Lab 07/02/22 0904 07/04/22 1433 07/04/22 1515  NA 139 140  --   K 4.2 3.9  --   CL 100 104  --   CO2 22 26  --   GLUCOSE 98 102*  --   BUN 9 12  --   CREATININE 1.12 1.30*  --   CALCIUM 9.6 9.8  --   MG  --   --  1.7  GFRNONAA  --  57*  --   ANIONGAP  --  10  --  No results for input(s): "PROT", "ALBUMIN", "AST", "ALT", "ALKPHOS", "BILITOT" in the last 168 hours. Lipids No results for input(s): "CHOL", "TRIG", "HDL", "LABVLDL", "LDLCALC", "CHOLHDL" in the last 168 hours.  Hematology Recent Labs  Lab 07/04/22 1433  WBC 5.5  RBC 5.92*  HGB 17.0  HCT 52.3*  MCV 88.3  MCH 28.7  MCHC 32.5  RDW 14.0  PLT 170   Thyroid  Recent Labs  Lab 07/04/22 1515  TSH 1.342    BNP Recent Labs  Lab 07/04/22 1515  BNP 109.2*    DDimer No results for input(s): "DDIMER" in the last 168 hours.   Radiology/Studies:  DG Chest Portable 1 View  Result Date: 07/04/2022 CLINICAL DATA:  Shortness of breath, tachycardia EXAM: PORTABLE CHEST 1 VIEW COMPARISON:  03/03/2022 FINDINGS: Transverse diameter of heart is slightly increased. There are no signs of pulmonary edema or focal pulmonary consolidation. There is no significant pleural effusion or pneumothorax. IMPRESSION: No active disease. Electronically Signed   By: Elmer Picker Roth.D.   On: 07/04/2022 15:01     Assessment and Plan:   Paroxysmal AFib CHA2DS2Vasc is 5, on Eliquis, appropriately dosed Tikosyn He remains in AFlutter though rates better He thinks he is in/out of AF by symptoms, I suspect perhaps persistently has been in AFlutter with RVR and CVRs  QTc on tele and his EKG looks OK. Labs today ordered are pending Follow labs tomorrow Resume Tikosyn  DCCV tomorrow he is agreeable he assures me excellent medication compliance taking his Eliquis and Tikosyn BID without missed doses Particularly no missed doses of his Eliquis in 3 weeks or longer    HTN Better  Edema resolved   Risk Assessment/Risk Scores:    For questions or updates, please contact [facility] Please consult [email or web address] for contact info under    Signed, Patrick Jamaica, PA-C  07/05/2022 11:04 AM

## 2022-07-05 NOTE — Consult Note (Addendum)
Cardiology Consultation:   Patient ID: Patrick Roth MRN: [id number]; DOB: 1946-01-05  Admit date: 07/04/2022 Date of Consult: 07/05/2022  PCP:  [name], PA-C   [facility] Providers Cardiologist:  Mertie Moores, MD  Electrophysiologist:  Constance Haw, MD  {   Patient Profile:   Patrick Roth is a 76 y.o. male with a hx of  who is being seen 07/05/2022 for the evaluation of AFib, HTN, CVA,  HLD, syncope attributed to orthostasis in 2017, h/o medication non complinace, another stroke Feb 2022 (compliant with his [place] at the time)  >> polycythemia, RBBB at the request of Dr. Alanda Amass.   AFib Hx Diagnosed 2017   AAD hx Started amiodarone March 2019 >> seems self stopped sometime in the Fall 2021 2/2 making him feel cold and lowered his pulse Toprol was stopped by the patient making him feel bad, poorly tolerated Tikosyn started Dec 2022  History of Present Illness:   Patrick Roth was seen by myself recently a couple of times, most recently 07/02/22, he was in rapid Aflutter 170s, hemodynamically though with stable BP and tolerating well.  He could tell he was going fast, denies symptoms other then awareness of it, and reported that mornings were traditionally worse for him because his GO issues were the worst in the AMs and this always provokes his arrhythmia and fast rates. He remained enthusiastically pleased with his Tikosyn and improvement in rhythm control. Even though the last time is aw him only weeks before he was in a rate controlled Aflutter. He was recommended last week to go to the ER but he declined, felt sure that after home and able to have a BM, take his meds, his HR would improve. When we reached out to him that afternoon, he felt well and reported his HRs in the 81'X  He is certain that his AF/RVR is provoked by his GI issues.  He sought attention yesterday at the ER for high BP and high HRs with some chest pressure and R leg tingling (this a complaint  for weeks-months) He was transferred to [facility] for admission and management. Started on a dilt gtt, home Tikosyn continued  LABS K+ 3.9 Mag 1.7 (given 891m of mag ox) BUN/Creat 12/1.30 BNP 109 HS Trop 10 > 10 WBC 5.5 H/H 17/52 Plts 170 TSH 1.342   Dilt gtt at 5 He got tikosyn last night but not this AM (stopped)  He reports that he went home from out visit did have a BM and did have improvement in his HR, he also noted that he ha gotten stung by a wasp and thinks that contributed to his HR as well. He did fine until yesterday, started feeling a little weak, heavy in the chest checked his BP and BP and HR were elevated and went to the ER    Past Medical History:  Diagnosis Date   Atrial fibrillation/flutter    Intol of amio, beta blocker // Echo 12/22: inf HK, EF 50-55, normal RVSF, mild LaE, mild MR, mod MAC, AV sclerosis w/o aS   Colon polyps    Constipation 05/27/2015   DOE (dyspnea on exertion) 10/13/2015   Faintness 06/20/2016   History of chicken pox    Hyperlipidemia    Hypertension    Internal hemorrhoids    Polycythemia    Sciatica    [place]    Hx of stroke in2008; recurrent CVA in 12/2020   Syncope and collapse 06/28/2016    Past  Surgical History:  Procedure Laterality Date   CARDIOVERSION N/A 09/02/2017   Procedure: CARDIOVERSION;  Surgeon: [name], MD;  Location: [facility] ENDOSCOPY;  Service: Cardiovascular;  Laterality: N/A;   COLONOSCOPY  2011   great toe surgery     PROSTATE BIOPSY       Home Medications:  Prior to Admission medications   Medication Sig Start Date End Date Taking? Authorizing Provider  amLODipine (NORVASC) 10 MG tablet Take 10 mg by mouth daily.   Yes [provider]  apixaban (ELIQUIS) 5 MG TABS tablet Take 1 tablet by mouth twice daily Patient taking differently: Take 5 mg by mouth 2 (two) times daily. 02/19/22  Yes Camnitz, Will Hassell Done, MD  dofetilide [facility]) 250 MCG capsule Take 1 capsule by mouth twice daily  06/11/22  Yes Fenton, Clint R, PA  furosemide (LASIX) 20 MG tablet Take 1 tablet (20 mg total) by mouth daily. 06/16/22  Yes Baldwin Jamaica, PA-C  metoprolol tartrate (LOPRESSOR) 25 MG tablet Take 25 mg by mouth as needed (if Heart right goes above 125).   Yes [provider]  oxyCODONE-acetaminophen (PERCOCET/ROXICET) 5-325 MG tablet Take 1 tablet by mouth every 8 (eight) hours as needed for moderate pain. 03/18/22  Yes [provider]  polyethylene glycol powder (GLYCOLAX/MIRALAX) 17 GM/SCOOP powder Take 17 g by mouth in the morning and at bedtime. Start with 1 scoop 2 times per day until bowels are moving, then reduce to 1 scoop daily. Drink 64oz of water daily. Patient taking differently: Take 17 g by mouth daily as needed for mild constipation. 09/18/21  Yes Hudnell, Colletta [place], NP  potassium chloride SA (KLOR-CON M) 20 MEQ tablet TAKE 1 TABLET BY MOUTH IN THE MORNING AND TAKE 2 TABLETS IN THE EVENING Patient taking differently: Take 20 mEq by mouth 3 (three) times daily. 05/19/22  Yes Allwardt, Alyssa M, PA-C  pregabalin (LYRICA) 50 MG capsule Take 50 mg by mouth 3 (three) times daily. 04/20/22  Yes [provider]  REPATHA SURECLICK 093 MG/ML SOAJ INJECT 1 PEN INTO THE SKIN EVERY 14 (FOURTEEN) DAYS Patient taking differently: Inject 140 mg into the skin every 14 (fourteen) days. 01/07/22  Yes Camnitz, Ocie Doyne, MD  Vitamin D, Ergocalciferol, (DRISDOL) 1.25 MG (50000 UNIT) CAPS capsule Take 1 capsule (50,000 Units total) by mouth every 7 (seven) days. Patient taking differently: Take 50,000 Units by mouth every 7 (seven) days. Wednesday 05/10/22  Yes Allwardt, [name], PA-C    Inpatient Medications: Scheduled Meds:  apixaban  5 mg Oral BID   furosemide  20 mg Oral Daily   pregabalin  50 mg Oral TID   Continuous Infusions:  diltiazem (CARDIZEM) infusion 5 mg/hr (07/04/22 1546)   PRN Meds: metoprolol tartrate, ondansetron (ZOFRAN) IV,  oxyCODONE-acetaminophen  Allergies:    Allergies  Allergen Reactions   Crestor [Rosuvastatin Calcium] Shortness Of Breath    Muscle cramps   Spironolactone Shortness Of Breath   Terazosin Shortness Of Breath, Palpitations and Other (See Comments)    Nerve pain, also   Amiodarone Other (See Comments)    Dropped pulse real low to 45-50, and made him feel very cold   Atenolol Other (See Comments)    Drowsiness and "flu sxs"   Bystolic [Nebivolol Hcl] Other (See Comments)    GI issues   Gabapentin Other (See Comments)    "Did not help with pain"   Hydralazine Other (See Comments)    Joint swelling   Tizanidine Other (See Comments)  Syncope, Elevated BP/pulse   Clonidine Other (See Comments)    Unknown reaction   Hydrocodone-Acetaminophen Other (See Comments)    Unknown reaction   Levofloxacin Other (See Comments)    Unknown reaction   Losartan Other (See Comments)    Did not work.  Pt states he couldn't take b/c of SE, but can't remember what SE were.   Maxzide [Hydrochlorothiazide W-Triamterene] Other (See Comments)    Does not tolerate potassium-sparing diuretics   Olmesartan Other (See Comments)    Unknown reaction   Tussionex Pennkinetic Er [Hydrocod Poli-Chlorphe Poli Er] Other (See Comments)    Unknown reaction    Social History:   Social History   Socioeconomic History   Marital status: Married    Spouse name: [name]   Number of children: 1   Years of education: 12   Highest education level: Not on file  Occupational History   Occupation: Retired  Tobacco Use   Smoking status: Never   Smokeless tobacco: Never   Tobacco comments:    Never smoke 03/11/22  Vaping Use   Vaping Use: Never used  Substance and Sexual Activity   Alcohol use: No   Drug use: No   Sexual activity: Not on file  Other Topics Concern   Not on file  Social History Narrative   Lives with wife   Right Handed   Drinks no caffeine   Social Determinants of Health   Financial  Resource Strain: Low Risk  (10/12/2021)   Overall Financial Resource Strain (CARDIA)    Difficulty of Paying Living Expenses: Not hard at all  Food Insecurity: No Food Insecurity (10/12/2021)   Hunger Vital Sign    Worried About Running Out of Food in the Last Year: Never true    [place] in the Last Year: Never true  Transportation Needs: No Transportation Needs (10/12/2021)   PRAPARE - Hydrologist (Medical): No    Lack of Transportation (Non-Medical): No  Physical Activity: Inactive (10/12/2021)   Exercise Vital Sign    Days of Exercise per Week: 0 days    Minutes of Exercise per Session: 0 min  Stress: No Stress Concern Present (10/12/2021)   [place]    Feeling of Stress : Not at all  Social Connections: Moderately Isolated (10/12/2021)   Social Connection and Isolation Panel [NHANES]    Frequency of Communication with Friends and Family: More than three times a week    Frequency of Social Gatherings with Friends and Family: More than three times a week    Attends Religious Services: Never    Marine scientist or Organizations: No    Attends Archivist Meetings: Never    Marital Status: Married  Human resources officer Violence: Not At Risk (10/12/2021)   Humiliation, Afraid, Rape, and Kick questionnaire    Fear of Current or Ex-Partner: No    Emotionally Abused: No    Physically Abused: No    Sexually Abused: No    Family History:   Family History  Problem Relation Age of Onset   Hypertension Father 53       Deceased   Heart disease Father    Lung cancer Mother 49       Deceased   Healthy Sister        x3   Healthy Brother        x4   Heart disease Brother        #  5   Other Daughter        Alpha Thalassemia   Lupus Daughter        #2-deceased   Diabetes Neg Hx    Heart attack Neg Hx    Hyperlipidemia Neg Hx    Sudden death Neg Hx      ROS:   Please see the history of present illness.  All other ROS reviewed and negative.     Physical Exam/Data:   Vitals:   07/05/22 0412 07/05/22 0830 07/05/22 0900 07/05/22 0931  BP: (!) 148/99 123/87 131/85 118/87  Pulse: 88 93    Resp: 17     Temp: 97.8 F (36.6 C) 97.9 F (36.6 C)    TempSrc: Oral Oral    SpO2: 100% 100%    Weight:      Height:        Intake/Output Summary (Last 24 hours) at 07/05/2022 1104 Last data filed at 07/05/2022 0636 Gross per 24 hour  Intake 240 ml  Output 1825 ml  Net -1585 ml      07/04/2022    2:23 PM 07/02/2022    8:00 AM 06/16/2022    8:12 AM  Last 3 Weights  Weight (lbs) 210 lb 1.6 oz 210 lb 211 lb 3.2 oz  Weight (kg) 95.3 kg 95.255 kg 95.8 kg     Body mass index is 31.95 kg/m.  General:  Well nourished, well developed, in no acute distress HEENT: normal Neck: no JVD Vascular: No carotid bruits; Distal pulses 2+ bilaterally Cardiac:  RRR; no murmurs, gallops or rubs Lungs:  CTA b/l, no wheezing, rhonchi or rales  Abd: soft, nontender  Ext: no edema Musculoskeletal:  No deformities Skin: warm and dry  Neuro:  no focal abnormalities noted Psych:  Normal affect   EKG:  The EKG was personally reviewed and demonstrates:   AFlutter Telemetry:  Telemetry was personally reviewed and demonstrates:   Remains in Aflutter 90's  Relevant CV Studies:  10/31/21: TTE IMPRESSIONS   1. Inferior basal hypokinesis . Left ventricular ejection fraction, by  estimation, is 50 to 55%. The left ventricle has low normal function. The  left ventricle demonstrates regional wall motion abnormalities (see  scoring diagram/findings for  description). There is mild left ventricular hypertrophy. Left ventricular  diastolic parameters were normal.   2. Right ventricular systolic function is normal. The right ventricular  size is normal. There is normal pulmonary artery systolic pressure.   3. Left atrial size was mildly dilated.   4. The mitral valve is  degenerative. Mild mitral valve regurgitation. No  evidence of mitral stenosis. Moderate mitral annular calcification.   5. The aortic valve is tricuspid. Aortic valve regurgitation is not  visualized. Aortic valve sclerosis/calcification is present, without any  evidence of aortic stenosis.   6. The inferior vena cava is normal in size with greater than 50%  respiratory variability, suggesting right atrial pressure of 3 mmHg.    Nov 2022, monitor (7 days) Predominant rhythm was sinus rhythm 1% ventricular ectopy 6.2% supraventricular ectopy Multiple SVT episodes, all less than 20 seconds No symptoms recorded     Nov 2021 Monitoring Max 215 bpm 02:28pm, 10/28 Min 82 bpm 11:16am, 10/28 Avg 112 bpm 3.3% PVC burden, rare supraventricular ectopy Atrial fibrillation/flutter occurred 100% of the time Longest ventricular run 10 seconds at 161 bpm No symptoms recorded  02/24/2018: stress myoview Nuclear stress EF: 58%. Normal perfusion. No ischemia This is a low risk study.  02/24/2018: TTE Study Conclusions  - Left ventricle: The cavity size was normal. Systolic function was    normal. The estimated ejection fraction was in the range of 55%    to 60%. Wall motion was normal; there were no regional wall    motion abnormalities. There was a reduced contribution of atrial    contraction to ventricular filling, due to increased ventricular    diastolic pressure or atrial contractile dysfunction. The study    is not technically sufficient to allow evaluation of LV diastolic    function. (suspect left atrial mechanical failure, possible    stunning after recent episode of atrial fibrillation versus    chronic atrial dysfunction)  - Left atrium: The atrium was mildly dilated.  - Right ventricle: The cavity size was mildly dilated. Wall    thickness was normal.  - Right atrium: The atrium was mildly dilated.  - Pulmonary arteries: Systolic pressure was moderately increased.    PA  peak pressure: 60 mm Hg (S).    6./15/18: 48 hor holter Minimum HR: 37 BPM at 12:18:54 PM Maximum HR: 124 BPM at 7:11:26 PM Average HR: 50 BPM 1% ventricular beats 4% supraventricular beats Ventricular runs of 6 beats Supraventricular runs of 13 beats   Zero atrial fibrillaiton noted on monitor. Average HR 50. No med changes at this time.  Will [name], MD   10/01/16 TTE - Left ventricle: The cavity size was normal. Systolic function was   normal. The estimated ejection fraction was in the range of 55%   to 60%. Wall motion was normal; there were no regional wall   motion abnormalities. Doppler parameters are consistent with a   reversible restrictive pattern, indicative of decreased left   ventricular diastolic compliance and/or increased left atrial   pressure (grade 3 diastolic dysfunction). Doppler parameters are   consistent with high ventricular filling pressure. - Aortic valve: Transvalvular velocity was within the normal range.   There was no stenosis. - Mitral valve: Transvalvular velocity was within the normal range.   There was no evidence for stenosis. There was mild regurgitation. - Right ventricle: The cavity size was mildly dilated. Wall   thickness was normal. Systolic function was normal. - Atrial septum: No defect or patent foramen ovale was identified   by color flow Doppler. - Tricuspid valve: There was mild regurgitation. - Pulmonary arteries: Systolic pressure was moderately to severely   increased. PA peak pressure: 57 mm Hg (S). - Pericardium, extracardiac: A trivial pericardial effusion was   identified.   10/10/15: lexiscan stress myoview There was no ST segment deviation noted during stress. No T wave inversion was noted during stress. The study is normal. This is a low risk study.  Low risk stress nuclear study with normal perfusion. Non gated study.  Laboratory Data:  High Sensitivity Troponin:   Recent Labs  Lab 07/04/22 1433  07/04/22 1734  TROPONINIHS 10 10     Chemistry Recent Labs  Lab 07/02/22 0904 07/04/22 1433 07/04/22 1515  NA 139 140  --   K 4.2 3.9  --   CL 100 104  --   CO2 22 26  --   GLUCOSE 98 102*  --   BUN 9 12  --   CREATININE 1.12 1.30*  --   CALCIUM 9.6 9.8  --   MG  --   --  1.7  GFRNONAA  --  57*  --   ANIONGAP  --  10  --  No results for input(s): "PROT", "ALBUMIN", "AST", "ALT", "ALKPHOS", "BILITOT" in the last 168 hours. Lipids No results for input(s): "CHOL", "TRIG", "HDL", "LABVLDL", "LDLCALC", "CHOLHDL" in the last 168 hours.  Hematology Recent Labs  Lab 07/04/22 1433  WBC 5.5  RBC 5.92*  HGB 17.0  HCT 52.3*  MCV 88.3  MCH 28.7  MCHC 32.5  RDW 14.0  PLT 170   Thyroid  Recent Labs  Lab 07/04/22 1515  TSH 1.342    BNP Recent Labs  Lab 07/04/22 1515  BNP 109.2*    DDimer No results for input(s): "DDIMER" in the last 168 hours.   Radiology/Studies:  DG Chest Portable 1 View  Result Date: 07/04/2022 CLINICAL DATA:  Shortness of breath, tachycardia EXAM: PORTABLE CHEST 1 VIEW COMPARISON:  03/03/2022 FINDINGS: Transverse diameter of heart is slightly increased. There are no signs of pulmonary edema or focal pulmonary consolidation. There is no significant pleural effusion or pneumothorax. IMPRESSION: No active disease. Electronically Signed   By: Elmer Picker M.D.   On: 07/04/2022 15:01     Assessment and Plan:   Paroxysmal AFib CHA2DS2Vasc is 5, on Eliquis, appropriately dosed Tikosyn He remains in AFlutter though rates better He thinks he is in/out of AF by symptoms, I suspect perhaps persistently has been in AFlutter with RVR and CVRs  QTc on tele and his EKG looks OK. Labs today ordered are pending Follow labs tomorrow Resume Tikosyn  DCCV tomorrow he is agreeable he assures me excellent medication compliance taking his Eliquis and Tikosyn BID without missed doses Particularly no missed doses of his Eliquis in 3 weeks or longer    HTN Better  Edema resolved   Risk Assessment/Risk Scores:    For questions or updates, please contact [facility] Please consult [email or web address] for contact info under    Signed, Baldwin Jamaica, PA-C  07/05/2022 11:04 AM

## 2022-07-05 NOTE — Addendum Note (Signed)
Addended by: Drue Novel I on: 07/05/2022 01:28 PM   Modules accepted: Orders

## 2022-07-05 NOTE — Hospital Course (Signed)
76 y.o. male with medical history significant of PAF status post cardioversion, on Tikosyn and Eliquis, HTN, HLD, CKD stage II, stroke, chronic dyssynergic defecation, presented with chest pain palpitations.  8/14- EP to see today, plan to restart tikosyn, will f/u recommendations ?PPM placement

## 2022-07-06 ENCOUNTER — Observation Stay (HOSPITAL_COMMUNITY): Payer: Medicare PPO | Admitting: Certified Registered Nurse Anesthetist

## 2022-07-06 ENCOUNTER — Encounter (HOSPITAL_COMMUNITY)
Admission: EM | Disposition: A | Discharge status: Home / Self Care | Payer: Self-pay | Source: Home / Self Care | Attending: Emergency Medicine

## 2022-07-06 ENCOUNTER — Encounter (HOSPITAL_COMMUNITY): Payer: Self-pay | Admitting: Internal Medicine

## 2022-07-06 ENCOUNTER — Observation Stay (HOSPITAL_BASED_OUTPATIENT_CLINIC_OR_DEPARTMENT_OTHER): Payer: Medicare PPO | Admitting: Certified Registered Nurse Anesthetist

## 2022-07-06 DIAGNOSIS — Z8673 Personal history of transient ischemic attack (TIA), and cerebral infarction without residual deficits: Secondary | ICD-10-CM | POA: Diagnosis not present

## 2022-07-06 DIAGNOSIS — I480 Paroxysmal atrial fibrillation: Secondary | ICD-10-CM

## 2022-07-06 DIAGNOSIS — R109 Unspecified abdominal pain: Secondary | ICD-10-CM

## 2022-07-06 DIAGNOSIS — N182 Chronic kidney disease, stage 2 (mild): Secondary | ICD-10-CM | POA: Diagnosis not present

## 2022-07-06 DIAGNOSIS — R933 Abnormal findings on diagnostic imaging of other parts of digestive tract: Secondary | ICD-10-CM | POA: Diagnosis not present

## 2022-07-06 DIAGNOSIS — R600 Localized edema: Secondary | ICD-10-CM | POA: Diagnosis not present

## 2022-07-06 DIAGNOSIS — R935 Abnormal findings on diagnostic imaging of other abdominal regions, including retroperitoneum: Secondary | ICD-10-CM | POA: Diagnosis not present

## 2022-07-06 DIAGNOSIS — I10 Essential (primary) hypertension: Secondary | ICD-10-CM

## 2022-07-06 DIAGNOSIS — I4891 Unspecified atrial fibrillation: Secondary | ICD-10-CM

## 2022-07-06 DIAGNOSIS — I4892 Unspecified atrial flutter: Secondary | ICD-10-CM | POA: Diagnosis not present

## 2022-07-06 DIAGNOSIS — Z7901 Long term (current) use of anticoagulants: Secondary | ICD-10-CM | POA: Diagnosis not present

## 2022-07-06 DIAGNOSIS — I4819 Other persistent atrial fibrillation: Secondary | ICD-10-CM | POA: Diagnosis not present

## 2022-07-06 DIAGNOSIS — I129 Hypertensive chronic kidney disease with stage 1 through stage 4 chronic kidney disease, or unspecified chronic kidney disease: Secondary | ICD-10-CM | POA: Diagnosis not present

## 2022-07-06 HISTORY — PX: CARDIOVERSION: SHX1299

## 2022-07-06 LAB — BASIC METABOLIC PANEL
Anion gap: 8 (ref 5–15)
BUN: 13 mg/dL (ref 8–23)
CO2: 24 mmol/L (ref 22–32)
Calcium: 9.2 mg/dL (ref 8.9–10.3)
Chloride: 105 mmol/L (ref 98–111)
Creatinine, Ser: 1.25 mg/dL — ABNORMAL HIGH (ref 0.61–1.24)
GFR, Estimated: 59 mL/min — ABNORMAL LOW (ref >60)
Glucose, Bld: 94 mg/dL (ref 70–99)
Potassium: 4.2 mmol/L (ref 3.5–5.1)
Sodium: 137 mmol/L (ref 135–145)

## 2022-07-06 LAB — MAGNESIUM: Magnesium: 2.3 mg/dL (ref 1.7–2.4)

## 2022-07-06 SURGERY — CARDIOVERSION
Anesthesia: General

## 2022-07-06 MED ORDER — LIDOCAINE 2% (20 MG/ML) 5 ML SYRINGE
INTRAMUSCULAR | Status: DC | PRN
  Administered 2022-07-06: 100 mg via INTRAVENOUS

## 2022-07-06 MED ORDER — IOHEXOL 9 MG/ML PO SOLN
ORAL | Status: AC
  Filled 2022-07-06: qty 1000

## 2022-07-06 MED ORDER — PROPOFOL 10 MG/ML IV BOLUS
INTRAVENOUS | Status: DC | PRN
  Administered 2022-07-06: 50 mg via INTRAVENOUS

## 2022-07-06 MED ORDER — IOHEXOL 9 MG/ML PO SOLN
500.0000 mL | ORAL | Status: AC
  Administered 2022-07-06 (×2): 500 mL via ORAL

## 2022-07-06 NOTE — Anesthesia Postprocedure Evaluation (Signed)
Anesthesia Post Note  Patient: Patrick Roth  Procedure(s) Performed: CARDIOVERSION     Patient location during evaluation: Endoscopy Anesthesia Type: General Level of consciousness: awake and alert Pain management: pain level controlled Vital Signs Assessment: post-procedure vital signs reviewed and stable Respiratory status: spontaneous breathing, nonlabored ventilation and respiratory function stable Cardiovascular status: blood pressure returned to baseline and stable Postop Assessment: no apparent nausea or vomiting Anesthetic complications: no   No notable events documented.  Last Vitals:  Vitals:   07/06/22 1029 07/06/22 1306  BP:  113/77  Pulse: (!) 56 (!) 58  Resp: 14 16  Temp:  36.6 C  SpO2: 97% 90%    Last Pain:  Vitals:   07/06/22 1306  TempSrc: Oral  PainSc:                  Lidia Collum

## 2022-07-06 NOTE — Transfer of Care (Signed)
Immediate Anesthesia Transfer of Care Note  Patient: Patrick Roth  Procedure(s) Performed: CARDIOVERSION  Patient Location: Endoscopy Unit  Anesthesia Type:General  Level of Consciousness: drowsy, patient cooperative and responds to stimulation  Airway & Oxygen Therapy: Patient Spontanous Breathing  Post-op Assessment: Report given to RN and Post -op Vital signs reviewed and stable  Post vital signs: Reviewed and stable  Last Vitals:  Vitals Value Taken Time  BP    Temp    Pulse 53 07/06/22 0956  Resp 17 07/06/22 0956  SpO2 97 % 07/06/22 0956  Vitals shown include unvalidated device data.  Last Pain:  Vitals:   07/06/22 0912  TempSrc: Oral  PainSc: 7       Patients Stated Pain Goal: 0 (06/81/66 1969)  Complications: No notable events documented.

## 2022-07-06 NOTE — Progress Notes (Addendum)
Progress Note  Patient Name: Patrick Roth Date of Encounter: 07/06/2022  [facility] Cardiologist: Mertie Moores, MD   Subjective   Low belly bloating/discomfort, no cardiac awareness or complaints, "mornings are always bad on my stomach"  Inpatient Medications    Scheduled Meds:  apixaban  5 mg Oral BID   dofetilide  250 mcg Oral BID   furosemide  20 mg Oral Daily   pregabalin  50 mg Oral TID   Continuous Infusions:  sodium chloride 20 mL/hr at 07/06/22 0723   diltiazem (CARDIZEM) infusion 5 mg/hr (07/05/22 1542)   PRN Meds: metoprolol tartrate, ondansetron (ZOFRAN) IV, oxyCODONE-acetaminophen   Vital Signs    Vitals:   07/05/22 1500 07/05/22 1600 07/05/22 1955 07/06/22 0541  BP: 120/89 (!) 119/90 124/84 121/83  Pulse:   94 88  Resp:   17 18  Temp:   98.8 F (37.1 C) 97.7 F (36.5 C)  TempSrc:   Oral Oral  SpO2:   97% 96%  Weight:      Height:        Intake/Output Summary (Last 24 hours) at 07/06/2022 0849 Last data filed at 07/05/2022 2027 Gross per 24 hour  Intake 83.25 ml  Output 700 ml  Net -616.75 ml      07/04/2022    2:23 PM 07/02/2022    8:00 AM 06/16/2022    8:12 AM  Last 3 Weights  Weight (lbs) 210 lb 1.6 oz 210 lb 211 lb 3.2 oz  Weight (kg) 95.3 kg 95.255 kg 95.8 kg      Telemetry    Remains in AFlutter, 90's some 120's (while we are in the room) - Personally Reviewed  ECG    AFlutter 88bpm, RBBB, QTc looks OK< Zared Knoth re-measure once back in SR - Personally Reviewed with Dr. [name]  Physical Exam   GEN: No acute distress.   Neck: No JVD Cardiac: irreg-irreg, tachycardic, no murmurs, rubs, or gallops.  Respiratory: CTA b/l. GI: Soft, nontender, non-distended  MS: trace if any edema; No deformity. Neuro:  Nonfocal  Psych: Normal affect   Labs    High Sensitivity Troponin:   Recent Labs  Lab 07/04/22 1433 07/04/22 1734  TROPONINIHS 10 10     Chemistry Recent Labs  Lab 07/04/22 1433 07/04/22 1515 07/05/22 1147  07/06/22 0326  NA 140  --  139 137  K 3.9  --  3.9 4.2  CL 104  --  105 105  CO2 26  --  26 24  GLUCOSE 102*  --  97 94  BUN 12  --  9 13  CREATININE 1.30*  --  1.15 1.25*  CALCIUM 9.8  --  9.2 9.2  MG  --  1.7 1.9 2.3  GFRNONAA 57*  --  >60 59*  ANIONGAP 10  --  8 8    Lipids No results for input(s): "CHOL", "TRIG", "HDL", "LABVLDL", "LDLCALC", "CHOLHDL" in the last 168 hours.  Hematology Recent Labs  Lab 07/04/22 1433  WBC 5.5  RBC 5.92*  HGB 17.0  HCT 52.3*  MCV 88.3  MCH 28.7  MCHC 32.5  RDW 14.0  PLT 170   Thyroid  Recent Labs  Lab 07/04/22 1515  TSH 1.342    BNP Recent Labs  Lab 07/04/22 1515  BNP 109.2*    DDimer No results for input(s): "DDIMER" in the last 168 hours.   Radiology     Cardiac Studies   07/05/22: TTE 1. Left ventricular ejection fraction, by estimation,  is 60 to 65%. The  left ventricle has normal function. The left ventricle has no regional  wall motion abnormalities. There is mild left ventricular hypertrophy.  Indeterminate diastolic filling due to  E-A fusion.   2. Right ventricular systolic function is normal. The right ventricular  size is normal. There is normal pulmonary artery systolic pressure. The  estimated right ventricular systolic pressure is 74.0 mmHg.   3. Left atrial size was moderately dilated.   4. The mitral valve is grossly normal. Trivial mitral valve  regurgitation.   5. The aortic valve is tricuspid. Aortic valve regurgitation is not  visualized. Aortic valve sclerosis is present, with no evidence of aortic  valve stenosis.   6. The inferior vena cava is normal in size with greater than 50%  respiratory variability, suggesting right atrial pressure of 3 mmHg.   Comparison(s): Changes from prior study are noted. 10/31/2021: LVEF  50-55%, inferobasal hypokinesis.    10/31/21: TTE IMPRESSIONS   1. Inferior basal hypokinesis . Left ventricular ejection fraction, by  estimation, is 50 to 55%. The left  ventricle has low normal function. The  left ventricle demonstrates regional wall motion abnormalities (see  scoring diagram/findings for  description). There is mild left ventricular hypertrophy. Left ventricular  diastolic parameters were normal.   2. Right ventricular systolic function is normal. The right ventricular  size is normal. There is normal pulmonary artery systolic pressure.   3. Left atrial size was mildly dilated.   4. The mitral valve is degenerative. Mild mitral valve regurgitation. No  evidence of mitral stenosis. Moderate mitral annular calcification.   5. The aortic valve is tricuspid. Aortic valve regurgitation is not  visualized. Aortic valve sclerosis/calcification is present, without any  evidence of aortic stenosis.   6. The inferior vena cava is normal in size with greater than 50%  respiratory variability, suggesting right atrial pressure of 3 mmHg.    Nov 2022, monitor (7 days) Predominant rhythm was sinus rhythm 1% ventricular ectopy 6.2% supraventricular ectopy Multiple SVT episodes, all less than 20 seconds No symptoms recorded     Nov 2021 Monitoring Max 215 bpm 02:28pm, 10/28 Min 82 bpm 11:16am, 10/28 Avg 112 bpm 3.3% PVC burden, rare supraventricular ectopy Atrial fibrillation/flutter occurred 100% of the time Longest ventricular run 10 seconds at 161 bpm No symptoms recorded   02/24/2018: stress myoview Nuclear stress EF: 58%. Normal perfusion. No ischemia This is a low risk study.       02/24/2018: TTE Study Conclusions  - Left ventricle: The cavity size was normal. Systolic function was    normal. The estimated ejection fraction was in the range of 55%    to 60%. Wall motion was normal; there were no regional wall    motion abnormalities. There was a reduced contribution of atrial    contraction to ventricular filling, due to increased ventricular    diastolic pressure or atrial contractile dysfunction. The study    is not technically  sufficient to allow evaluation of LV diastolic    function. (suspect left atrial mechanical failure, possible    stunning after recent episode of atrial fibrillation versus    chronic atrial dysfunction)  - Left atrium: The atrium was mildly dilated.  - Right ventricle: The cavity size was mildly dilated. Wall    thickness was normal.  - Right atrium: The atrium was mildly dilated.  - Pulmonary arteries: Systolic pressure was moderately increased.    PA peak pressure: 60 mm Hg (S).  6./15/18: 48 hor holter Minimum HR: 37 BPM at 12:18:54 PM Maximum HR: 124 BPM at 7:11:26 PM Average HR: 50 BPM 1% ventricular beats 4% supraventricular beats Ventricular runs of 6 beats Supraventricular runs of 13 beats   Zero atrial fibrillaiton noted on monitor. Average HR 50. No med changes at this time.  Darica Goren [name], MD   10/01/16 TTE - Left ventricle: The cavity size was normal. Systolic function was   normal. The estimated ejection fraction was in the range of 55%   to 60%. Wall motion was normal; there were no regional wall   motion abnormalities. Doppler parameters are consistent with a   reversible restrictive pattern, indicative of decreased left   ventricular diastolic compliance and/or increased left atrial   pressure (grade 3 diastolic dysfunction). Doppler parameters are   consistent with high ventricular filling pressure. - Aortic valve: Transvalvular velocity was within the normal range.   There was no stenosis. - Mitral valve: Transvalvular velocity was within the normal range.   There was no evidence for stenosis. There was mild regurgitation. - Right ventricle: The cavity size was mildly dilated. Wall   thickness was normal. Systolic function was normal. - Atrial septum: No defect or patent foramen ovale was identified   by color flow Doppler. - Tricuspid valve: There was mild regurgitation. - Pulmonary arteries: Systolic pressure was moderately to severely   increased. PA  peak pressure: 57 mm Hg (S). - Pericardium, extracardiac: A trivial pericardial effusion was   identified.   10/10/15: lexiscan stress myoview There was no ST segment deviation noted during stress. No T wave inversion was noted during stress. The study is normal. This is a low risk study.  Low risk stress nuclear study with normal perfusion. Non gated study.  Patient Profile     76 y.o. male  with a hx of  who is being seen 07/05/2022 for the evaluation of AFib, HTN, CVA,  HLD, syncope attributed to orthostasis in 2017, h/o medication non complinace, another stroke Feb 2022 (compliant with his [place] at the time)  >> polycythemia, RBBB, chronic belly/GI issues w/diagnosis of Dyssynergic defecation, follows with GI outpt admitted with HTN and AFlutter w/RVR   AFib Hx Diagnosed 2017   AAD hx Started amiodarone March 2019 >> seems self stopped sometime in the Fall 2021 2/2 making him feel cold and lowered his pulse Toprol was stopped by the patient making him feel bad, poorly tolerated Tikosyn started Dec 2022  Assessment & Plan    Paroxysmal AFib CHA2DS2Vasc is 5, on Eliquis, appropriately dosed Tikosyn He remains in AFlutter though rates better He thinks he is in/out of AF by symptoms, I suspect perhaps persistently has been in AFlutter with RVR and CVRs  EKG/tele reviewed with Dr. [name] who has also seen and examined the patient today   DCCV today, discussed with the patient again today, he remains agreeable he assured me excellent medication compliance taking his Eliquis and Tikosyn BID without missed doses Particularly no missed doses of his Eliquis in 3 weeks or longer   HTN Better   Edema Resolved  4. Belly complaints Rectal pressure/back discomfort that seems to radiate "everywhere" Very much food triggered, especially beef  Sometimes if unable to have a BM radiates upwards and makes it hard to breath. He locates low abdomen that radiates "everywhere when it gets  bad" No tenderness He very much feels his belly symptoms are the trigger for his RVR/AFib/flutter Harrison Zetina defer to IM and his GI outpatient  For questions or updates, please contact [place] Please consult [email or web address] for contact info under        Signed, Baldwin Jamaica, PA-C  07/06/2022, 8:49 AM    I have seen and examined this patient with Tommye Standard.  Agree with above, note added to reflect my findings.  Remains in atrial flutter. Continue abdominal complaints of pain.  GEN: Well nourished, well developed, in no acute distress  HEENT: normal  Neck: no JVD, carotid bruits, or masses Cardiac: tachycardic, regular; no murmurs, rubs, or gallops,no edema  Respiratory:  clear to auscultation bilaterally, normal work of breathing GI: soft, nontender, nondistended, + BS MS: no deformity or atrophy  Skin: warm and dry Neuro:  Strength and sensation are intact Psych: euthymic mood, full affect   Atrial fibrillation/flutter: currently on tikosyn  and eliquis. Plan for DCCV today. If back in rhythm Greyson Peavy have him follow up in AF clinic post discharge. Abdominal pain: long term complaint. Plan per primary team.   Quinci Gavidia M. Owynn Mosqueda MD 07/06/2022 11:29 PM

## 2022-07-06 NOTE — Progress Notes (Signed)
  Progress Note   Patient: Patrick Roth BHA:[id number] DOB: [birth year] DOA: 07/04/2022     0 DOS: the patient was seen and examined on 07/06/2022   Brief hospital course: 76 y.o. male with medical history significant of PAF status post cardioversion, on Tikosyn and Eliquis, HTN, HLD, CKD stage II, stroke, chronic dyssynergic defecation, presented with chest pain palpitations.  8/14- EP to see today, plan to restart tikosyn, will f/u recommendations ?PPM placement  Assessment and Plan: #A-fib with RVR - previously on Cardizem and Tikosyn for rate control however Cardizem was dropped by cardiology at some point this year,  EP evaluated patient will plan for DCCV earlier today, continue tikosyn Keep on monitor for now - PT working with patient, continue ambulating OOB TID - monitor hemodynamics, currently HDS  #Chronic Calcification Hepatic Flexure - He does have a abdomen CT scan in May this year showed likely a chronic calcification/bone in the hepatic flexure:.  He went to discuss his usual with his GI doctor who recommended sigmoidoscope however is yet to be done.  And patient continues to experience severe constipation/difficulty with bowel movement, and he attributes that very much to his uncontrolled A-fib Given elevated level of pain, patient requesting GI evaluation. We will page GI and I appreciate their involvement.   #Chronic constipation and abdominal pain -Probably related to narcotic use, I offered him Nalogel, he preferred to have a conversation with his GI doctor before making decision.   #HTN -Controlled right now, on Cardizem drip, hold off amlodipine for now.   #CKD stage II -Euvolemic, creatinine level stable.       Subjective:    Physical Exam: Vitals:   07/06/22 1010 07/06/22 1020 07/06/22 1029 07/06/22 1306  BP: 101/71 93/70  113/77  Pulse: (!) 53 (!) 50 (!) 56 (!) 58  Resp: '16 14 14 16  '$ Temp:    97.8 F (36.6 C)  TempSrc:    Oral  SpO2: 97% 97% 97% 90%   Weight:      Height:      Eyes: PERRL, lids and conjunctivae normal ENMT: Mucous membranes are moist. Posterior pharynx clear of any exudate or lesions.Normal dentition.  Neck: normal, supple, no masses, no thyromegaly Respiratory: clear to auscultation bilaterally, no wheezing, no crackles. Normal respiratory effort. No accessory muscle use.  Cardiovascular: Irregular heart rate, no murmurs / rubs / gallops. No extremity edema. 2+ pedal pulses. No carotid bruits.  Abdomen: no tenderness, no masses palpated. No hepatosplenomegaly. Bowel sounds positive.  No pulsatile masses Musculoskeletal: no clubbing / cyanosis. No joint deformity upper and lower extremities. Good ROM, no contractures. Normal muscle tone.  Skin: no rashes, lesions, ulcers. No induration Neurologic: CN 2-12 grossly intact. Sensation intact, DTR normal. Strength 5/5 in all 4.  Psychiatric: Normal judgment and insight. Alert and oriented x 3. Normal mood.     Data Reviewed:  There are no new results to review at this time.  Family Communication: none available  Disposition: Status is: Observation The patient remains OBS appropriate and will d/c before 2 midnights.  Planned Discharge Destination: Home    Time spent: 25 minutes  Author: Vanna Scotland, MD 07/06/2022 3:45 PM  For on call review [email or web address].

## 2022-07-06 NOTE — Interval H&P Note (Signed)
History and Physical Interval Note:  07/06/2022 9:39 AM  [name]  has presented today for surgery, with the diagnosis of afib.  The various methods of treatment have been discussed with the patient and family. After consideration of risks, benefits and other options for treatment, the patient has consented to  Procedure(s): CARDIOVERSION (N/A) as a surgical intervention.  The patient's history has been reviewed, patient examined, no change in status, stable for surgery.  I have reviewed the patient's chart and labs.  Questions were answered to the patient's satisfaction.     Freada Bergeron

## 2022-07-06 NOTE — CV Procedure (Signed)
Procedure: Electrical Cardioversion Indications:  Atrial Flutter  Procedure Details:  Consent: Risks of procedure as well as the alternatives and risks of each were explained to the (patient/caregiver).  Consent for procedure obtained.  Time Out: Verified patient identification, verified procedure, site/side was marked, verified correct patient position, special equipment/implants available, medications/allergies/relevent history reviewed, required imaging and test results available. PERFORMED.  Patient placed on cardiac monitor, pulse oximetry, supplemental oxygen as necessary.  Sedation given:  Propofol '50mg'$ ; lidocaine '100mg'$  Pacer pads placed anterior and posterior chest.  Cardioverted 1 time(s).  Cardioversion with synchronized biphasic 150J shock.  Evaluation: Findings: Post procedure EKG shows:  Sinus bradycardia with first degree AVB Complications: None Patient did tolerate procedure well.  Time Spent Directly with the Patient:  32mnutes   HFreada Bergeron8/15/2023, 9:54 AM

## 2022-07-06 NOTE — Anesthesia Preprocedure Evaluation (Signed)
Anesthesia Evaluation  Patient identified by MRN, date of birth, ID band Patient awake    Reviewed: Allergy & Precautions, H&P , NPO status , Patient's Chart, lab work & pertinent test results  History of Anesthesia Complications Negative for: history of anesthetic complications  Airway Mallampati: II  TM Distance: >3 FB Neck ROM: full    Dental   Pulmonary neg pulmonary ROS,    Pulmonary exam normal        Cardiovascular hypertension, + dysrhythmias Atrial Fibrillation  Rhythm:irregular Rate:Tachycardia   Echo 07/05/22: EF 60-65%, mild LVH, inferobasal hypokinesis, AV sclerosis no stenosis   Neuro/Psych CVA negative psych ROS   GI/Hepatic Neg liver ROS, GERD  ,  Endo/Other  negative endocrine ROS  Renal/GU negative Renal ROS  negative genitourinary   Musculoskeletal   Abdominal   Peds  Hematology   Anesthesia Other Findings   Reproductive/Obstetrics                             Anesthesia Physical Anesthesia Plan  ASA: 3  Anesthesia Plan: General   Post-op Pain Management:    Induction:   PONV Risk Score and Plan:   Airway Management Planned:   Additional Equipment:   Intra-op Plan:   Post-operative Plan:   Informed Consent: I have reviewed the patients History and Physical, chart, labs and discussed the procedure including the risks, benefits and alternatives for the proposed anesthesia with the patient or authorized representative who has indicated his/her understanding and acceptance.       Plan Discussed with: Anesthesiologist  Anesthesia Plan Comments:         Anesthesia Quick Evaluation

## 2022-07-06 NOTE — Consult Note (Signed)
Consultation  Referring Provider: [facility]/ Dr Alanda Amass Primary Care Physician:  Allwardt, [name], PA-C Primary Gastroenterologist:  Dr.Menon Rom[facility]   Reason for Consultation:  abnormal CT of Colon , chronic constipation  HPI: Patrick Roth is a 76 y.o. male who was admitted through the emergency room on 07/04/2022 after presenting with weakness, chest pain, and rapid atrial fibrillation. Has been evaluated by cardiology and underwent cardioversion today. He has history of A-fib flutter, poorly controlled hypertension, hyperlipidemia, history of CVA in 2008 and 2022.  He also has chronic GI issues with prior diagnosis of dyssynergic defecation, chronic constipation, IBS in addition chronic kidney disease stage II and degenerative disc disease.  He has chronic pain issues and is on chronic pain medication.  Patient has been evaluated by GI/[facility] in [place] as recently as spring 2023 with primary complaint of fecal urgency and lower abdominal discomfort at that time with decreased sphincter control.  He did undergo flexible sigmoidoscopy on 03/15/2022 there which was entirely normal. He had gone to the emergency room at [facility] in May 2023 with complaints of nonlocalized abdominal pain.  He had CT of the abdomen and pelvis done with contrast that shows a tiny hiatal hernia, no evidence of bowel obstruction, normal-appearing appendix, there was a linear calcification within the hepatic flexure of the colon measuring 1.7 cm in length, no adjacent stranding and no free intraperitoneal gas ,moderately distended bladder.  Patient had been seen his previous gastroenterologist at [facility], he says to have further discussion about what this abnormal area in his colon is.  Some question of doing colonoscopy that he was to have a follow-up office visit which has not occurred.  Currently has no complaints of right upper quadrant pain and is not having any abdominal pain at present.  He is  taking MiraLAX usually twice daily for his chronic constipation which she says works but sometimes causes loose stools.  He has a variety of vague GI symptoms occasional hot sensation on his abdominal wall, he describes a sensation of rectal pain, sometimes precipitated by eating a large meal, and then urge for bowel movement.  He has not been having any rectal bleeding.  Appetite has been okay no nausea or vomiting.  He says he is primarily concerned about anything going on in his abdomen that is going to affect his heart.  Past Medical History:  Diagnosis Date   Atrial fibrillation/flutter    Intol of amio, beta blocker // Echo 12/22: inf HK, EF 50-55, normal RVSF, mild LaE, mild MR, mod MAC, AV sclerosis w/o aS   Colon polyps    Constipation 05/27/2015   DOE (dyspnea on exertion) 10/13/2015   Faintness 06/20/2016   History of chicken pox    Hyperlipidemia    Hypertension    Internal hemorrhoids    Polycythemia    Sciatica    Stroke ([place])    Hx of stroke in2008; recurrent CVA in 12/2020   Syncope and collapse 06/28/2016    Past Surgical History:  Procedure Laterality Date   CARDIOVERSION N/A 09/02/2017   Procedure: CARDIOVERSION;  Surgeon: [name], MD;  Location: [facility];  Service: Cardiovascular;  Laterality: N/A;   COLONOSCOPY  2011   great toe surgery     PROSTATE BIOPSY      Prior to Admission medications   Medication Sig Start Date End Date Taking? Authorizing Provider  amLODipine (NORVASC) 10 MG tablet Take 10 mg by mouth daily.   Yes [provider]  apixaban (ELIQUIS) 5 MG TABS tablet Take 1 tablet by mouth twice daily Patient taking differently: Take 5 mg by mouth 2 (two) times daily. 02/19/22  Yes Camnitz, Will Hassell Done, MD  dofetilide [facility]) 250 MCG capsule Take 1 capsule by mouth twice daily 06/11/22  Yes Fenton, Clint R, PA  furosemide (LASIX) 20 MG tablet Take 1 tablet (20 mg total) by mouth daily. 06/16/22  Yes Baldwin Jamaica, PA-C   metoprolol tartrate (LOPRESSOR) 25 MG tablet Take 25 mg by mouth as needed (if Heart right goes above 125).   Yes [provider]  oxyCODONE-acetaminophen (PERCOCET/ROXICET) 5-325 MG tablet Take 1 tablet by mouth every 8 (eight) hours as needed for moderate pain. 03/18/22  Yes [provider]  polyethylene glycol powder (GLYCOLAX/MIRALAX) 17 GM/SCOOP powder Take 17 g by mouth in the morning and at bedtime. Start with 1 scoop 2 times per day until bowels are moving, then reduce to 1 scoop daily. Drink 64oz of water daily. Patient taking differently: Take 17 g by mouth daily as needed for mild constipation. 09/18/21  Yes Hudnell, Colletta [place], NP  potassium chloride SA (KLOR-CON M) 20 MEQ tablet TAKE 1 TABLET BY MOUTH IN THE MORNING AND TAKE 2 TABLETS IN THE EVENING Patient taking differently: Take 20 mEq by mouth 3 (three) times daily. 05/19/22  Yes Allwardt, Alyssa M, PA-C  pregabalin (LYRICA) 50 MG capsule Take 50 mg by mouth 3 (three) times daily. 04/20/22  Yes [provider]  REPATHA SURECLICK 650 MG/ML SOAJ INJECT 1 PEN INTO THE SKIN EVERY 14 (FOURTEEN) DAYS Patient taking differently: Inject 140 mg into the skin every 14 (fourteen) days. 01/07/22  Yes Camnitz, Ocie Doyne, MD  Vitamin D, Ergocalciferol, (DRISDOL) 1.25 MG (50000 UNIT) CAPS capsule Take 1 capsule (50,000 Units total) by mouth every 7 (seven) days. Patient taking differently: Take 50,000 Units by mouth every 7 (seven) days. Wednesday 05/10/22  Yes Allwardt, [name], PA-C    Current Facility-Administered Medications  Medication Dose Route Frequency Provider Last Rate Last Admin   apixaban (ELIQUIS) tablet 5 mg  5 mg Oral BID Wynetta Fines T, MD   5 mg at 07/06/22 3546   dofetilide (TIKOSYN) capsule 250 mcg  250 mcg Oral BID Baldwin Jamaica, PA-C   250 mcg at 07/06/22 5681   furosemide (LASIX) tablet 20 mg  20 mg Oral Daily Wynetta Fines T, MD   20 mg at 07/06/22 2751   metoprolol tartrate (LOPRESSOR) tablet 25  mg  25 mg Oral BID PRN Lequita Halt, MD       ondansetron [facility]) injection 4 mg  4 mg Intravenous Q6H PRN Lequita Halt, MD       oxyCODONE-acetaminophen (PERCOCET/ROXICET) 5-325 MG per tablet 1 tablet  1 tablet Oral Q8H PRN Lequita Halt, MD   1 tablet at 07/05/22 2021   pregabalin (LYRICA) capsule 50 mg  50 mg Oral TID Lequita Halt, MD   50 mg at 07/06/22 7001    Allergies as of 07/04/2022 - Review Complete 07/04/2022  Allergen Reaction Noted   Crestor [rosuvastatin calcium] Shortness Of Breath 02/25/2016   Spironolactone Shortness Of Breath 06/20/2016   Terazosin Shortness Of Breath, Palpitations, and Other (See Comments) 04/21/2016   Amiodarone Other (See Comments) 09/11/2020   Atenolol Other (See Comments) 74/94/4967   Bystolic [nebivolol hcl] Other (See Comments) 06/20/2016   Gabapentin Other (See Comments) 01/06/2022   Hydralazine Other (See Comments) 06/27/2015   Tizanidine Other (See Comments)  06/28/2016   Clonidine Other (See Comments) 06/27/2015   Hydrocodone-acetaminophen Other (See Comments) 06/27/2015   Levofloxacin Other (See Comments) 06/27/2015   Losartan Other (See Comments) 06/27/2015   Maxzide [hydrochlorothiazide w-triamterene] Other (See Comments) 06/20/2016   Olmesartan Other (See Comments) 06/27/2015   Tussionex pennkinetic er [hydrocod poli-chlorphe poli er] Other (See Comments) 08/23/2012    Family History  Problem Relation Age of Onset   Hypertension Father 55       Deceased   Heart disease Father    Lung cancer Mother 3       Deceased   Healthy Sister        x3   Healthy Brother        x4   Heart disease Brother        #5   Other Daughter        [facility] Thalassemia   Lupus Daughter        #2-deceased   Diabetes Neg Hx    Heart attack Neg Hx    Hyperlipidemia Neg Hx    Sudden death Neg Hx     Social History   Socioeconomic History   Marital status: Married    Spouse name: [name]   Number of children: 1   Years of education: 12    Highest education level: Not on file  Occupational History   Occupation: Retired  Tobacco Use   Smoking status: Never   Smokeless tobacco: Never   Tobacco comments:    Never smoke 03/11/22  Vaping Use   Vaping Use: Never used  Substance and Sexual Activity   Alcohol use: No   Drug use: No   Sexual activity: Not on file  Other Topics Concern   Not on file  Social History Narrative   Lives with wife   Right Handed   Drinks no caffeine   Social Determinants of Health   Financial Resource Strain: Low Risk  (10/12/2021)   Overall Financial Resource Strain (CARDIA)    Difficulty of Paying Living Expenses: Not hard at all  Food Insecurity: No Food Insecurity (10/12/2021)   Hunger Vital Sign    Worried About Running Out of Food in the Last Year: Never true    Ran Out of Food in the Last Year: Never true  Transportation Needs: No Transportation Needs (10/12/2021)   PRAPARE - Hydrologist (Medical): No    Lack of Transportation (Non-Medical): No  Physical Activity: Inactive (10/12/2021)   Exercise Vital Sign    Days of Exercise per Week: 0 days    Minutes of Exercise per Session: 0 min  Stress: No Stress Concern Present (10/12/2021)   [place]    Feeling of Stress : Not at all  Social Connections: Moderately Isolated (10/12/2021)   Social Connection and Isolation Panel [NHANES]    Frequency of Communication with Friends and Family: More than three times a week    Frequency of Social Gatherings with Friends and Family: More than three times a week    Attends Religious Services: Never    Marine scientist or Organizations: No    Attends Archivist Meetings: Never    Marital Status: Married  Human resources officer Violence: Not At Risk (10/12/2021)   Humiliation, Afraid, Rape, and Kick questionnaire    Fear of Current or Ex-Partner: No    Emotionally Abused: No     Physically Abused: No    Sexually Abused: No  Review of Systems: Pertinent positive and negative review of systems were noted in the above HPI section.  All other review of systems was otherwise negative.   Physical Exam: Vital signs in last 24 hours: Temp:  [97.6 F (36.4 C)-98.8 F (37.1 C)] 97.8 F (36.6 C) (08/15 1306) Pulse Rate:  [50-94] 58 (08/15 1306) Resp:  [14-19] 16 (08/15 1306) BP: (93-129)/(70-97) 113/77 (08/15 1306) SpO2:  [90 %-97 %] 90 % (08/15 1306) Weight:  [95.3 kg] 95.3 kg (08/15 0912) Last BM Date : 07/04/22 General:   Alert,  Well-developed, well-nourished, elderly African-American male pleasant and cooperative in NAD Head:  Normocephalic and atraumatic. Eyes:  Sclera clear, no icterus.   Conjunctiva pink. Ears:  Normal auditory acuity. Nose:  No deformity, discharge,  or lesions. Mouth:  No deformity or lesions.   Neck:  Supple; no masses or thyromegaly. Lungs:  Clear throughout to auscultation.   No wheezes, crackles, or rhonchi. Heart:  Regular rate and rhythm; no murmurs, clicks, rubs,  or gallops. Abdomen:  Soft,nontender, BS active,nonpalp mass or hsm.   Rectal: Done today Msk:  Symmetrical without gross deformities. . Pulses:  Normal pulses noted. Extremities:  Without clubbing or edema. Neurologic:  Alert and  oriented x4;  grossly normal neurologically. Skin:  Intact without significant lesions or rashes.. Psych:  Alert and cooperative. Normal mood and affect.  Intake/Output from previous day: 08/14 0701 - 08/15 0700 In: 83.3 [I.V.:33.3; IV Piggyback:50] Out: 700 [Urine:700] Intake/Output this shift: No intake/output data recorded.  Lab Results: Recent Labs    07/04/22 1433  WBC 5.5  HGB 17.0  HCT 52.3*  PLT 170   BMET Recent Labs    07/04/22 1433 07/05/22 1147 07/06/22 0326  NA 140 139 137  K 3.9 3.9 4.2  CL 104 105 105  CO2 _0 GLUCOSE 102* 97 94  BUN _1 CREATININE 1.30* 1.15 1.25*  CALCIUM 9.8 9.2 9.2    LFT No results for input(s): "PROT", "ALBUMIN", "AST", "ALT", "ALKPHOS", "BILITOT", "BILIDIR", "IBILI" in the last 72 hours. PT/INR No results for input(s): "LABPROT", "INR" in the last 72 hours. Hepatitis Panel No results for input(s): "HEPBSAG", "HCVAB", "HEPAIGM", "HEPBIGM" in the last 72 hours.    IMPRESSION:  #60 76 year old African-American male admitted with chest pain and tachycardia and found to have rapid A-fib, he is chronically anticoagulated on Eliquis. Pt underwent cardioversion today  #2 history of CVA 2008 in 2022 #3 hypertension #4.  Hyperlipidemia #5.  Chronic kidney disease stage II #6 previously noted calcified density which appears to be in the hepatic flexure of the colon noted on CT scan May 2023-currently asymptomatic  #7 History of IBS, chronic constipation, dyssynergic defecation by prior notes-currently managing with MiraLAX 1-2 doses per day #8 chronic pain syndrome/on chronic narcotics #9 degenerative disc disease    PLAN: We will schedule for repeat CT of the abdomen and pelvis with contrast to reassess for persistence of this calcified area in the hepatic flexure noted on prior CT Continue his current bowel regimen with MiraLAX 17 g in 8 ounces of water twice daily Would not plan for any endoscopic evaluation during this admission as he has just been cardioverted and is anticoagulated. And will be for him to follow-up with [facility] on discharge.   Barack Nicodemus PA-C 07/06/2022, 3:38 PM

## 2022-07-06 NOTE — Progress Notes (Signed)
Post DCCV EKG reviewed SR 55bpm, RBBB, 2st degree AVblock 222m, LAD  Manually measured QT 480-5054m QTc 460-47930mnot accounting for a QRS of 160m67mQTc is OK to continue his Tikosyn.  ReneTommye Standard-C

## 2022-07-07 ENCOUNTER — Encounter (HOSPITAL_COMMUNITY): Payer: Self-pay | Admitting: Cardiology

## 2022-07-07 ENCOUNTER — Other Ambulatory Visit: Payer: Medicare PPO

## 2022-07-07 ENCOUNTER — Observation Stay (HOSPITAL_COMMUNITY): Payer: Medicare PPO

## 2022-07-07 DIAGNOSIS — I4891 Unspecified atrial fibrillation: Secondary | ICD-10-CM | POA: Diagnosis not present

## 2022-07-07 DIAGNOSIS — I4819 Other persistent atrial fibrillation: Secondary | ICD-10-CM

## 2022-07-07 DIAGNOSIS — K6389 Other specified diseases of intestine: Secondary | ICD-10-CM | POA: Diagnosis not present

## 2022-07-07 DIAGNOSIS — R933 Abnormal findings on diagnostic imaging of other parts of digestive tract: Secondary | ICD-10-CM | POA: Diagnosis not present

## 2022-07-07 DIAGNOSIS — R109 Unspecified abdominal pain: Secondary | ICD-10-CM | POA: Diagnosis not present

## 2022-07-07 DIAGNOSIS — I480 Paroxysmal atrial fibrillation: Secondary | ICD-10-CM | POA: Diagnosis not present

## 2022-07-07 DIAGNOSIS — R935 Abnormal findings on diagnostic imaging of other abdominal regions, including retroperitoneum: Secondary | ICD-10-CM

## 2022-07-07 LAB — BASIC METABOLIC PANEL
Anion gap: 8 (ref 5–15)
BUN: 13 mg/dL (ref 8–23)
CO2: 30 mmol/L (ref 22–32)
Calcium: 9.2 mg/dL (ref 8.9–10.3)
Chloride: 101 mmol/L (ref 98–111)
Creatinine, Ser: 1.26 mg/dL — ABNORMAL HIGH (ref 0.61–1.24)
GFR, Estimated: 59 mL/min — ABNORMAL LOW (ref >60)
Glucose, Bld: 93 mg/dL (ref 70–99)
Potassium: 3.6 mmol/L (ref 3.5–5.1)
Sodium: 139 mmol/L (ref 135–145)

## 2022-07-07 LAB — MAGNESIUM: Magnesium: 2.3 mg/dL (ref 1.7–2.4)

## 2022-07-07 MED ORDER — POTASSIUM CHLORIDE CRYS ER 20 MEQ PO TBCR
40.0000 meq | EXTENDED_RELEASE_TABLET | Freq: Once | ORAL | Status: AC
  Administered 2022-07-07: 40 meq via ORAL
  Filled 2022-07-07: qty 2

## 2022-07-07 MED ORDER — POLYETHYLENE GLYCOL 3350 17 G PO PACK
17.0000 g | PACK | Freq: Every day | ORAL | Status: DC
  Administered 2022-07-07: 17 g via ORAL
  Filled 2022-07-07: qty 1

## 2022-07-07 MED ORDER — IOHEXOL 300 MG/ML  SOLN
100.0000 mL | Freq: Once | INTRAMUSCULAR | Status: AC | PRN
Start: 2022-07-07 — End: 2022-07-07
  Administered 2022-07-07: 100 mL via INTRAVENOUS

## 2022-07-07 NOTE — Progress Notes (Signed)
Patient ID: Patrick Roth, male   DOB: May 08, 1946, 76 y.o.   MRN: [id number]    Progress Note   Subjective   Day  # 3  CC; abnormal CT of the colon, chronic constipation  CT abdomen and pelvis 07/07/2022-previously seen calcific density in the hepatic flexure is no longer identified no findings to suggest foreign body, no perforation, remainder of the exam stable from prior study.  Patient says he feels pretty well today, much more comfortable Follow-up in sinus rhythm Still with chronic constipation complaints, and IBS type symptoms with abdominal discomfort prior to bowel movements  Hoping to go home today    Objective   Vital signs in last 24 hours: Temp:  [97.8 F (36.6 C)-98 F (36.7 C)] 97.9 F (36.6 C) (08/16 1200) Pulse Rate:  [56-70] 58 (08/16 1200) Resp:  [16-18] 16 (08/16 0816) BP: (113-156)/(73-84) 124/73 (08/16 1200) SpO2:  [90 %-98 %] 98 % (08/16 1200) Last BM Date : 07/05/22 General:    Elderly African-American male in NAD, pleasant Heart:  Regular rate and rhythm; no murmurs . Neurologic:  Alert and oriented,  grossly normal neurologically. Psych:  Cooperative. Normal mood and affect.  Intake/Output from previous day: No intake/output data recorded. Intake/Output this shift: No intake/output data recorded.  Lab Results: Recent Labs    07/04/22 1433  WBC 5.5  HGB 17.0  HCT 52.3*  PLT 170   BMET Recent Labs    07/05/22 1147 07/06/22 0326 07/07/22 0626  NA 139 137 139  K 3.9 4.2 3.6  CL 105 105 101  CO2 '26 24 30  '$ GLUCOSE 97 94 93  BUN '9 13 13  '$ CREATININE 1.15 1.25* 1.26*  CALCIUM 9.2 9.2 9.2   LFT No results for input(s): "PROT", "ALBUMIN", "AST", "ALT", "ALKPHOS", "BILITOT", "BILIDIR", "IBILI" in the last 72 hours. PT/INR No results for input(s): "LABPROT", "INR" in the last 72 hours.  Studies/Results: CT ABDOMEN PELVIS W CONTRAST  Result Date: 07/07/2022 CLINICAL DATA:  Linear density in the colon on the prior exam, follow-up  possible foreign body EXAM: CT ABDOMEN AND PELVIS WITH CONTRAST TECHNIQUE: Multidetector CT imaging of the abdomen and pelvis was performed using the standard protocol following bolus administration of intravenous contrast. RADIATION DOSE REDUCTION: This exam was performed according to the departmental dose-optimization program which includes automated exposure control, adjustment of the mA and/or kV according to patient size and/or use of iterative reconstruction technique. CONTRAST:  141m OMNIPAQUE IOHEXOL 300 MG/ML  SOLN COMPARISON:  04/14/2022 FINDINGS: Lower chest: Lung bases are free of acute infiltrate or sizable effusion. Hepatobiliary: No focal liver abnormality is seen. No gallstones, gallbladder wall thickening, or biliary dilatation. Pancreas: Unremarkable. No pancreatic ductal dilatation or surrounding inflammatory changes. Spleen: Normal in size without focal abnormality. Adrenals/Urinary Tract: Adrenal glands are within normal limits. Kidneys demonstrate a normal enhancement pattern bilaterally. No renal calculi are noted. Delayed images demonstrate excretion of contrast material. No obstructive changes are noted. The bladder is well distended. Stomach/Bowel: No obstructive or inflammatory changes of the colon are noted. The previously seen linear density within the hepatic flexure is no longer identified and has likely passed in the interval from the prior study. No extraluminal density is identified to correspond with the previous findings to suggest perforation. The appendix is within normal limits. Small bowel and stomach are unremarkable. Vascular/Lymphatic: Aortic atherosclerosis. No enlarged abdominal or pelvic lymph nodes. Reproductive: Prostate is enlarged in size stable Other: No abdominal wall hernia or abnormality. No abdominopelvic ascites. Musculoskeletal:  Degenerative changes of lumbar spine are noted. IMPRESSION: Previously seen calcific density in the hepatic flexure is no longer  identified. No findings to suggest perforation are noted. The remainder of the exam is stable from the prior study. Electronically Signed   By: Inez Catalina M.D.   On: 07/07/2022 00:31   DG Abd 2 Views  Result Date: 07/05/2022 CLINICAL DATA:  Evaluate for foreign body in soft tissue. EXAM: ABDOMEN - 2 VIEW COMPARISON:  None Available. FINDINGS: The bowel gas pattern is normal. There is no evidence of free air. No radio-opaque calculi or other significant radiographic abnormality is seen. IMPRESSION: 1. Normal bowel gas pattern. 2. No radiopaque foreign bodies identified. Electronically Signed   By: Kerby Moors M.D.   On: 07/05/2022 17:56   ECHOCARDIOGRAM COMPLETE  Result Date: 07/05/2022    ECHOCARDIOGRAM REPORT   Patient Name:   Patrick Roth Date of Exam: 07/05/2022 Medical Rec #:  [id number]     Height:       68.0 in Accession #:    [id number]    Weight:       210.1 lb Date of Birth:  09-10-1946     BSA:          2.087 m Patient Age:    49 years      BP:           148/99 mmHg Patient Gender: M             HR:           128 bpm. Exam Location:  Inpatient Procedure: 2D Echo, Cardiac Doppler, Color Doppler and Intracardiac            Opacification Agent Indications:    Chest pain  History:        Patient has prior history of Echocardiogram examinations, most                 recent 10/31/2021. Arrythmias:Atrial Fibrillation; Risk                 Factors:Hypertension and Dyslipidemia. Hx CVA. CKD.  Sonographer:    Greer Pickerel Referring Phys: [id number] Lequita Halt  Sonographer Comments: Technically difficult study due to poor echo windows. IMPRESSIONS  1. Left ventricular ejection fraction, by estimation, is 60 to 65%. The left ventricle has normal function. The left ventricle has no regional wall motion abnormalities. There is mild left ventricular hypertrophy. Indeterminate diastolic filling due to E-A fusion.  2. Right ventricular systolic function is normal. The right ventricular size is normal. There is  normal pulmonary artery systolic pressure. The estimated right ventricular systolic pressure is 30.8 mmHg.  3. Left atrial size was moderately dilated.  4. The mitral valve is grossly normal. Trivial mitral valve regurgitation.  5. The aortic valve is tricuspid. Aortic valve regurgitation is not visualized. Aortic valve sclerosis is present, with no evidence of aortic valve stenosis.  6. The inferior vena cava is normal in size with greater than 50% respiratory variability, suggesting right atrial pressure of 3 mmHg. Comparison(s): Changes from prior study are noted. 10/31/2021: LVEF 50-55%, inferobasal hypokinesis. FINDINGS  Left Ventricle: Left ventricular ejection fraction, by estimation, is 60 to 65%. The left ventricle has normal function. The left ventricle has no regional wall motion abnormalities. Definity contrast agent was given IV to delineate the left ventricular  endocardial borders. The left ventricular internal cavity size was normal in size. There is mild left ventricular hypertrophy. Indeterminate diastolic filling due to  E-A fusion. Right Ventricle: The right ventricular size is normal. No increase in right ventricular wall thickness. Right ventricular systolic function is normal. There is normal pulmonary artery systolic pressure. The tricuspid regurgitant velocity is 2.65 m/s, and  with an assumed right atrial pressure of 3 mmHg, the estimated right ventricular systolic pressure is 14.4 mmHg. Left Atrium: Left atrial size was moderately dilated. Right Atrium: Right atrial size was normal in size. Pericardium: There is no evidence of pericardial effusion. Mitral Valve: The mitral valve is grossly normal. Trivial mitral valve regurgitation. Tricuspid Valve: The tricuspid valve is grossly normal. Tricuspid valve regurgitation is trivial. Aortic Valve: The aortic valve is tricuspid. Aortic valve regurgitation is not visualized. Aortic valve sclerosis is present, with no evidence of aortic valve  stenosis. Pulmonic Valve: The pulmonic valve was not well visualized. Pulmonic valve regurgitation is not visualized. Aorta: The aortic root and ascending aorta are structurally normal, with no evidence of dilitation. Venous: The inferior vena cava is normal in size with greater than 50% respiratory variability, suggesting right atrial pressure of 3 mmHg. IAS/Shunts: No atrial level shunt detected by color flow Doppler.  LEFT VENTRICLE PLAX 2D LVIDd:         4.70 cm LVIDs:         2.90 cm LV PW:         1.10 cm LV IVS:        1.30 cm LVOT diam:     2.10 cm LV SV:         40 LV SV Index:   19 LVOT Area:     3.46 cm  RIGHT VENTRICLE RV Basal diam:  2.30 cm TAPSE (M-mode): 1.1 cm LEFT ATRIUM           Index        RIGHT ATRIUM           Index LA diam:      4.30 cm 2.06 cm/m   RA Area:     11.30 cm LA Vol (A2C): 34.4 ml 16.48 ml/m  RA Volume:   21.30 ml  10.21 ml/m LA Vol (A4C): 95.7 ml 45.86 ml/m  AORTIC VALVE LVOT Vmax:   75.90 cm/s LVOT Vmean:  50.100 cm/s LVOT VTI:    0.116 m  AORTA Ao Root diam: 3.60 cm Ao Asc diam:  3.70 cm TRICUSPID VALVE TR Peak grad:   28.1 mmHg TR Vmax:        265.00 cm/s  SHUNTS Systemic VTI:  0.12 m Systemic Diam: 2.10 cm Lyman Bishop MD Electronically signed by Lyman Bishop MD Signature Date/Time: 07/05/2022/1:52:42 PM    Final        Assessment / Plan:     #37 76 year old male admitted with chest pain and tachycardia secondary to rapid A-fib  Stable status post cardioversion yesterday  #2 history of IBS, chronic constipation, prior diagnosis of dyssynergic defecation by prior notes  #3 previous abnormal CT scan May 2020 through with concern for a calcified density in the hepatic flexure measuring 1.7 cm noted on CT May 2023  Repeat CT today shows no evidence of any calcified density within the bowel, and otherwise stable abdomen  #4 chronic kidney disease stage II #5 prior CVA 2000 06/2021 # 6 hypertension #7 chronic pain syndrome on chronic narcotics  Plan;  discussed CT with the patient, and he is reassured that there is no evidence of any foreign body in his bowel. He denies other GI complaints or chronic/longstanding.  He is  advised to continue MiraLAX 1-2 doses per day, and to make a follow-up appointment with [facility], whom he has seen previously.  OK for discharge from GI perspective     Principal Problem:   Atrial fibrillation with RVR (HCC) Active Problems:   Chest pain     LOS: 0 days   Antoinette Haskett  PA-C 07/07/2022, 12:07 PM

## 2022-07-07 NOTE — Discharge Summary (Signed)
Discharge Summary  Patrick Roth NLZ:[id number] DOB: 08-09-1946  PCP: [name], PA-C  Admit date: 07/04/2022 Discharge date: 07/07/2022    Time spent: 22mns, more than 50% time spent on coordination of care.   Recommendations for Outpatient Follow-up:  F/u with PCP within a week  for hospital discharge follow up, repeat cbc/bmp at follow up F/u with cardiology next week as scheduled    Discharge Diagnoses:  Active Hospital Problems   Diagnosis Date Noted   At[place] with RVR [place] 07/04/20[place]   Abnormal CT of the abdomen    Chest pain 07/04/2022    Resolved Hospital Problems  No resolved problems to display.    Discharge Condition: stable  Diet recommendation: heart healthy  Filed Weights   07/04/22 1423 07/06/22 0912  Weight: 95.3 kg 95.3 kg    History of present illness: ( per admitting MD Dr ZRoosevelt Locks Chief Complaint: Palpitations   HPI: Patrick HAFLEYis a 76y.o. male with medical history significant of PAF status post cardioversion, on Tikosyn and Eliquis, HTN, HLD, CKD stage II, stroke, chronic dyssynergic defecation, presented with chest pain palpitations.   Appears that the patient has been struggling with uncontrolled blood pressure and uncontrolled A-fib for several month, which he thinks is related to his GI problems.  He has a diagnosed dyssynergic defecation and has had difficult constipation, he told me that he especially struggles in the mornings to pass hard stools and causing blood pressure to jump and may have triggered A-fib as well.  He used to take as needed metoprolol for uncontrolled A-fib.  He visited his cardiology 2 days ago who upon knowing his uncontrolled A-fib recommended he come into the hospital.  Patient also told me he was offered PPM at some point, however at this point I did not see any cardiology note mentions PPM.  This afternoon, while in the church, patient started to feel palpitations and pressure-like chest pains,  centrally located, associated with shortness of breath.  Denies any lightheadedness vision problem or nauseous vomiting. ED Course: Patient was found in rapid A-fib, blood pressure elevated, Cardizem drip started and Tikosyn restarted in the ED.   Work creatinine 1.3, K3.9.  Hospital Course:  Principal Problem:   Atrial fibrillation with RVR (HCC) Active Problems:   Chest pain   Abnormal CT of the abdomen   Assessment and Plan:  #A-fib with RVR - previously on Cardizem and Tikosyn for rate control however Cardizem was dropped by cardiology at some point this year,  -seen by EP, s/ps successful  DCCV on 8/15, continue tikosyn and eliquis, and his PRN lopressor  -he is cleared to d/c home by cardiology, he is to follow up with cardiology next week    #Chronic Calcification Hepatic Flexure - He does have a abdomen CT scan in May this year showed likely a chronic calcification/bone in the hepatic flexure:.  He went to discuss his usual with his GI doctor who recommended sigmoidoscope however is yet to be done.  reports severe constipation/difficulty with bowel movement,  Given elevated level of pain, patient requesting GI evaluation who recommended repeat ct which did not show any calcification, he is cleared to d/c from gi stand point.   #Chronic constipation and abdominal pain -Probably related to narcotic use, I offered him Nalogel, he preferred to have a conversation with his GI doctor before making decision.   #HTN -bp stable on current regimen    #CKD stage II -Euvolemic, creatinine level stable.  Discharge Exam: BP 124/73 (BP Location: Right Arm)   Pulse (!) 58   Temp 97.9 F (36.6 C) (Oral)   Resp 16   Ht '5\' 8"'$  (1.727 m)   Wt 95.3 kg   SpO2 98%   BMI 31.95 kg/m   General: NAD, pleasant  Cardiovascular: RRR Respiratory: normal respiratory effort     Discharge Instructions     Diet - low sodium heart healthy   Complete by: As directed    Increase  activity slowly   Complete by: As directed       Allergies as of 07/07/2022       Reactions   Crestor [rosuvastatin Calcium] Shortness Of Breath   Muscle cramps   Spironolactone Shortness Of Breath   Terazosin Shortness Of Breath, Palpitations, Other (See Comments)   Nerve pain, also   Amiodarone Other (See Comments)   Dropped pulse real low to 45-50, and made him feel very cold   Atenolol Other (See Comments)   Drowsiness and "flu sxs"   Bystolic [nebivolol Hcl] Other (See Comments)   GI issues   Gabapentin Other (See Comments)   "Did not help with pain"   Hydralazine Other (See Comments)   Joint swelling   Tizanidine Other (See Comments)   Syncope, Elevated BP/pulse   Clonidine Other (See Comments)   Unknown reaction   Hydrocodone-acetaminophen Other (See Comments)   Unknown reaction   Levofloxacin Other (See Comments)   Unknown reaction   Losartan Other (See Comments)   Did not work.  Pt states he couldn't take b/c of SE, but can't remember what SE were.   Maxzide [hydrochlorothiazide W-triamterene] Other (See Comments)   Does not tolerate potassium-sparing diuretics   Olmesartan Other (See Comments)   Unknown reaction   Tussionex Pennkinetic Er [hydrocod Poli-chlorphe Poli Er] Other (See Comments)   Unknown reaction        Medication List     TAKE these medications    amLODipine 10 MG tablet Commonly known as: NORVASC Take 10 mg by mouth daily.   dofetilide 250 MCG capsule Commonly known as: TIKOSYN Take 1 capsule by mouth twice daily   Eliquis 5 MG Tabs tablet Generic drug: apixaban Take 1 tablet by mouth twice daily What changed: how much to take   furosemide 20 MG tablet Commonly known as: LASIX Take 1 tablet (20 mg total) by mouth daily.   metoprolol tartrate 25 MG tablet Commonly known as: LOPRESSOR Take 25 mg by mouth as needed (if Heart right goes above 125).   oxyCODONE-acetaminophen 5-325 MG tablet Commonly known as:  PERCOCET/ROXICET Take 1 tablet by mouth every 8 (eight) hours as needed for moderate pain.   polyethylene glycol powder 17 GM/SCOOP powder Commonly known as: GLYCOLAX/MIRALAX Take 17 g by mouth in the morning and at bedtime. Start with 1 scoop 2 times per day until bowels are moving, then reduce to 1 scoop daily. Drink 64oz of water daily. What changed:  when to take this reasons to take this additional instructions   potassium chloride SA 20 MEQ tablet Commonly known as: KLOR-CON M TAKE 1 TABLET BY MOUTH IN THE MORNING AND TAKE 2 TABLETS IN THE EVENING What changed: See the new instructions.   pregabalin 50 MG capsule Commonly known as: LYRICA Take 50 mg by mouth 3 (three) times daily.   Repatha SureClick 462 MG/ML Soaj Generic drug: Evolocumab INJECT 1 PEN INTO THE SKIN EVERY 14 (FOURTEEN) DAYS What changed: how much to take   Vitamin  D (Ergocalciferol) 1.25 MG (50000 UNIT) Caps capsule Commonly known as: DRISDOL Take 1 capsule (50,000 Units total) by mouth every 7 (seven) days. What changed: additional instructions       Allergies  Allergen Reactions   Crestor [Rosuvastatin Calcium] Shortness Of Breath    Muscle cramps   Spironolactone Shortness Of Breath   Terazosin Shortness Of Breath, Palpitations and Other (See Comments)    Nerve pain, also   Amiodarone Other (See Comments)    Dropped pulse real low to 45-50, and made him feel very cold   Atenolol Other (See Comments)    Drowsiness and "flu sxs"   Bystolic [Nebivolol Hcl] Other (See Comments)    GI issues   Gabapentin Other (See Comments)    "Did not help with pain"   Hydralazine Other (See Comments)    Joint swelling   Tizanidine Other (See Comments)    Syncope, Elevated BP/pulse   Clonidine Other (See Comments)    Unknown reaction   Hydrocodone-Acetaminophen Other (See Comments)    Unknown reaction   Levofloxacin Other (See Comments)    Unknown reaction   Losartan Other (See Comments)    Did not  work.  Pt states he couldn't take b/c of SE, but can't remember what SE were.   Maxzide [Hydrochlorothiazide W-Triamterene] Other (See Comments)    Does not tolerate potassium-sparing diuretics   Olmesartan Other (See Comments)    Unknown reaction   Tussionex Pennkinetic Er [Hydrocod Poli-Chlorphe Poli Er] Other (See Comments)    Unknown reaction      The results of significant diagnostics from this hospitalization (including imaging, microbiology, ancillary and laboratory) are listed below for reference.    Significant Diagnostic Studies: CT ABDOMEN PELVIS W CONTRAST  Result Date: 07/07/2022 CLINICAL DATA:  Linear density in the colon on the prior exam, follow-up possible foreign body EXAM: CT ABDOMEN AND PELVIS WITH CONTRAST TECHNIQUE: Multidetector CT imaging of the abdomen and pelvis was performed using the standard protocol following bolus administration of intravenous contrast. RADIATION DOSE REDUCTION: This exam was performed according to the departmental dose-optimization program which includes automated exposure control, adjustment of the mA and/or kV according to patient size and/or use of iterative reconstruction technique. CONTRAST:  110m OMNIPAQUE IOHEXOL 300 MG/ML  SOLN COMPARISON:  04/14/2022 FINDINGS: Lower chest: Lung bases are free of acute infiltrate or sizable effusion. Hepatobiliary: No focal liver abnormality is seen. No gallstones, gallbladder wall thickening, or biliary dilatation. Pancreas: Unremarkable. No pancreatic ductal dilatation or surrounding inflammatory changes. Spleen: Normal in size without focal abnormality. Adrenals/Urinary Tract: Adrenal glands are within normal limits. Kidneys demonstrate a normal enhancement pattern bilaterally. No renal calculi are noted. Delayed images demonstrate excretion of contrast material. No obstructive changes are noted. The bladder is well distended. Stomach/Bowel: No obstructive or inflammatory changes of the colon are noted. The  previously seen linear density within the hepatic flexure is no longer identified and has likely passed in the interval from the prior study. No extraluminal density is identified to correspond with the previous findings to suggest perforation. The appendix is within normal limits. Small bowel and stomach are unremarkable. Vascular/Lymphatic: Aortic atherosclerosis. No enlarged abdominal or pelvic lymph nodes. Reproductive: Prostate is enlarged in size stable Other: No abdominal wall hernia or abnormality. No abdominopelvic ascites. Musculoskeletal: Degenerative changes of lumbar spine are noted. IMPRESSION: Previously seen calcific density in the hepatic flexure is no longer identified. No findings to suggest perforation are noted. The remainder of the exam is stable from the  prior study. Electronically Signed   By: Inez Catalina M.D.   On: 07/07/2022 00:31   DG Abd 2 Views  Result Date: 07/05/2022 CLINICAL DATA:  Evaluate for foreign body in soft tissue. EXAM: ABDOMEN - 2 VIEW COMPARISON:  None Available. FINDINGS: The bowel gas pattern is normal. There is no evidence of free air. No radio-opaque calculi or other significant radiographic abnormality is seen. IMPRESSION: 1. Normal bowel gas pattern. 2. No radiopaque foreign bodies identified. Electronically Signed   By: Kerby Moors M.D.   On: 07/05/2022 17:56   ECHOCARDIOGRAM COMPLETE  Result Date: 07/05/2022    ECHOCARDIOGRAM REPORT   Patient Name:   TAHA DIMOND Date of Exam: 07/05/2022 Medical Rec #:  [id number]     Height:       68.0 in Accession #:    [id number]    Weight:       210.1 lb Date of Birth:  March 11, 1946     BSA:          2.087 m Patient Age:    76 years      BP:           148/99 mmHg Patient Gender: M             HR:           128 bpm. Exam Location:  Inpatient Procedure: 2D Echo, Cardiac Doppler, Color Doppler and Intracardiac            Opacification Agent Indications:    Chest pain  History:        Patient has prior history of  Echocardiogram examinations, most                 recent 10/31/2021. Arrythmias:Atrial Fibrillation; Risk                 Factors:Hypertension and Dyslipidemia. Hx CVA. CKD.  Sonographer:    Greer Pickerel Referring Phys: [id number] Lequita Halt  Sonographer Comments: Technically difficult study due to poor echo windows. IMPRESSIONS  1. Left ventricular ejection fraction, by estimation, is 60 to 65%. The left ventricle has normal function. The left ventricle has no regional wall motion abnormalities. There is mild left ventricular hypertrophy. Indeterminate diastolic filling due to E-A fusion.  2. Right ventricular systolic function is normal. The right ventricular size is normal. There is normal pulmonary artery systolic pressure. The estimated right ventricular systolic pressure is 62.8 mmHg.  3. Left atrial size was moderately dilated.  4. The mitral valve is grossly normal. Trivial mitral valve regurgitation.  5. The aortic valve is tricuspid. Aortic valve regurgitation is not visualized. Aortic valve sclerosis is present, with no evidence of aortic valve stenosis.  6. The inferior vena cava is normal in size with greater than 50% respiratory variability, suggesting right atrial pressure of 3 mmHg. Comparison(s): Changes from prior study are noted. 10/31/2021: LVEF 50-55%, inferobasal hypokinesis. FINDINGS  Left Ventricle: Left ventricular ejection fraction, by estimation, is 60 to 65%. The left ventricle has normal function. The left ventricle has no regional wall motion abnormalities. Definity contrast agent was given IV to delineate the left ventricular  endocardial borders. The left ventricular internal cavity size was normal in size. There is mild left ventricular hypertrophy. Indeterminate diastolic filling due to E-A fusion. Right Ventricle: The right ventricular size is normal. No increase in right ventricular wall thickness. Right ventricular systolic function is normal. There is normal pulmonary artery  systolic pressure. The tricuspid regurgitant velocity is 2.65  m/s, and  with an assumed right atrial pressure of 3 mmHg, the estimated right ventricular systolic pressure is 95.6 mmHg. Left Atrium: Left atrial size was moderately dilated. Right Atrium: Right atrial size was normal in size. Pericardium: There is no evidence of pericardial effusion. Mitral Valve: The mitral valve is grossly normal. Trivial mitral valve regurgitation. Tricuspid Valve: The tricuspid valve is grossly normal. Tricuspid valve regurgitation is trivial. Aortic Valve: The aortic valve is tricuspid. Aortic valve regurgitation is not visualized. Aortic valve sclerosis is present, with no evidence of aortic valve stenosis. Pulmonic Valve: The pulmonic valve was not well visualized. Pulmonic valve regurgitation is not visualized. Aorta: The aortic root and ascending aorta are structurally normal, with no evidence of dilitation. Venous: The inferior vena cava is normal in size with greater than 50% respiratory variability, suggesting right atrial pressure of 3 mmHg. IAS/Shunts: No atrial level shunt detected by color flow Doppler.  LEFT VENTRICLE PLAX 2D LVIDd:         4.70 cm LVIDs:         2.90 cm LV PW:         1.10 cm LV IVS:        1.30 cm LVOT diam:     2.10 cm LV SV:         40 LV SV Index:   19 LVOT Area:     3.46 cm  RIGHT VENTRICLE RV Basal diam:  2.30 cm TAPSE (M-mode): 1.1 cm LEFT ATRIUM           Index        RIGHT ATRIUM           Index LA diam:      4.30 cm 2.06 cm/m   RA Area:     11.30 cm LA Vol (A2C): 34.4 ml 16.48 ml/m  RA Volume:   21.30 ml  10.21 ml/m LA Vol (A4C): 95.7 ml 45.86 ml/m  AORTIC VALVE LVOT Vmax:   75.90 cm/s LVOT Vmean:  50.100 cm/s LVOT VTI:    0.116 m  AORTA Ao Root diam: 3.60 cm Ao Asc diam:  3.70 cm TRICUSPID VALVE TR Peak grad:   28.1 mmHg TR Vmax:        265.00 cm/s  SHUNTS Systemic VTI:  0.12 m Systemic Diam: 2.10 cm Lyman Bishop MD Electronically signed by Lyman Bishop MD Signature Date/Time:  07/05/2022/1:52:42 PM    Final    DG Chest Portable 1 View  Result Date: 07/04/2022 CLINICAL DATA:  Shortness of breath, tachycardia EXAM: PORTABLE CHEST 1 VIEW COMPARISON:  03/03/2022 FINDINGS: Transverse diameter of heart is slightly increased. There are no signs of pulmonary edema or focal pulmonary consolidation. There is no significant pleural effusion or pneumothorax. IMPRESSION: No active disease. Electronically Signed   By: Elmer Picker M.D.   On: 07/04/2022 15:01    Microbiology: No results found for this or any previous visit (from the past 240 hour(s)).   Labs: Basic Metabolic Panel: Recent Labs  Lab 07/02/22 0904 07/04/22 1433 07/04/22 1515 07/04/22 1734 07/05/22 1147 07/06/22 0326 07/07/22 0626  NA 139 140  --   --  139 137 139  K 4.2 3.9  --   --  3.9 4.2 3.6  CL 100 104  --   --  105 105 101  CO2 22 26  --   --  '26 24 30  '$ GLUCOSE 98 102*  --   --  97 94 93  BUN 9 12  --   --  $'9 13 13  'w$ CREATININE 1.12 1.30*  --   --  1.15 1.25* 1.26*  CALCIUM 9.6 9.8  --   --  9.2 9.2 9.2  MG  --   --  1.7  --  1.9 2.3 2.3  PHOS  --   --   --  3.7  --   --   --    Liver Function Tests: No results for input(s): "AST", "ALT", "ALKPHOS", "BILITOT", "PROT", "ALBUMIN" in the last 168 hours. No results for input(s): "LIPASE", "AMYLASE" in the last 168 hours. No results for input(s): "AMMONIA" in the last 168 hours. CBC: Recent Labs  Lab 07/04/22 1433  WBC 5.5  HGB 17.0  HCT 52.3*  MCV 88.3  PLT 170   Cardiac Enzymes: No results for input(s): "CKTOTAL", "CKMB", "CKMBINDEX", "TROPONINI" in the last 168 hours. BNP: BNP (last 3 results) Recent Labs    10/27/21 1116 10/28/21 1030 07/04/22 1515  BNP 133.7* 116.2* 109.2*    ProBNP (last 3 results) No results for input(s): "PROBNP" in the last 8760 hours.  CBG: No results for input(s): "GLUCAP" in the last 168 hours.  FURTHER DISCHARGE INSTRUCTIONS:   Get Medicines reviewed and adjusted: Please take all your  medications with you for your next visit with your Primary MD   Laboratory/radiological data: Please request your Primary MD to go over all hospital tests and procedure/radiological results at the follow up, please ask your Primary MD to get all Hospital records sent to his/her office.   In some cases, they will be blood work, cultures and biopsy results pending at the time of your discharge. Please request that your primary care M.D. goes through all the records of your hospital data and follows up on these results.   Also Note the following: If you experience worsening of your admission symptoms, develop shortness of breath, life threatening emergency, suicidal or homicidal thoughts you must seek medical attention immediately by calling 911 or calling your MD immediately  if symptoms less severe.   You must read complete instructions/literature along with all the possible adverse reactions/side effects for all the Medicines you take and that have been prescribed to you. Take any new Medicines after you have completely understood and accpet all the possible adverse reactions/side effects.    Do not drive when taking Pain medications or sleeping medications (Benzodaizepines)   Do not take more than prescribed Pain, Sleep and Anxiety Medications. It is not advisable to combine anxiety,sleep and pain medications without talking with your primary care practitioner   Special Instructions: If you have smoked or chewed Tobacco  in the last 2 yrs please stop smoking, stop any regular Alcohol  and or any Recreational drug use.   Wear Seat belts while driving.   Please note: You were cared for by a hospitalist during your hospital stay. Once you are discharged, your primary care physician will handle any further medical issues. Please note that NO REFILLS for any discharge medications will be authorized once you are discharged, as it is imperative that you return to your primary care physician (or  establish a relationship with a primary care physician if you do not have one) for your post hospital discharge needs so that they can reassess your need for medications and monitor your lab values.     Signed:  Florencia Reasons MD, PhD, FACP  [facility] 07/07/2022, 10:42 PM

## 2022-07-07 NOTE — Progress Notes (Addendum)
Progress Note  Patient Name: Patrick Roth Date of Encounter: 07/07/2022  [facility] Cardiologist: Mertie Moores, MD   Subjective   Sleeping, easily woken and reports feeling better  Inpatient Medications    Scheduled Meds:  apixaban  5 mg Oral BID   dofetilide  250 mcg Oral BID   furosemide  20 mg Oral Daily   iohexol       pregabalin  50 mg Oral TID   Continuous Infusions:   PRN Meds: iohexol, metoprolol tartrate, ondansetron (ZOFRAN) IV, oxyCODONE-acetaminophen   Vital Signs    Vitals:   07/06/22 1957 07/07/22 0058 07/07/22 0603 07/07/22 0816  BP: (!) 154/80 122/82 (!) 156/84 123/84  Pulse: 60 (!) 56 70 (!) 57  Resp: '18 16 16 16  '$ Temp: 97.9 F (36.6 C) 98 F (36.7 C) 98 F (36.7 C) 97.8 F (36.6 C)  TempSrc: Oral Oral Oral Oral  SpO2: 93% 96% 95% 96%  Weight:      Height:       No intake or output data in the 24 hours ending 07/07/22 0902     07/06/2022    9:12 AM 07/04/2022    2:23 PM 07/02/2022    8:00 AM  Last 3 Weights  Weight (lbs) 210 lb 1.6 oz 210 lb 1.6 oz 210 lb  Weight (kg) 95.3 kg 95.3 kg 95.255 kg      Telemetry    SB/SR 50's, 1st degree Avblock  - Personally Reviewed  ECG    Post DCCV yesterday is SB 55bpm, 1st degree Avblock 279m, RBBB, LAD, QTc stable - Personally Reviewed with Dr. CCurt Bears Physical Exam   GEN: No acute distress.   Neck: No JVD Cardiac: RRR, no murmurs, rubs, or gallops.  Respiratory: CTA b/l. GI: Soft, nontender, non-distended  MS: trace if any edema; No deformity. Neuro:  Nonfocal  Psych: Normal affect   Labs    High Sensitivity Troponin:   Recent Labs  Lab 07/04/22 1433 07/04/22 1734  TROPONINIHS 10 10     Chemistry Recent Labs  Lab 07/05/22 1147 07/06/22 0326 07/07/22 0626  NA 139 137 139  K 3.9 4.2 3.6  CL 105 105 101  CO2 '26 24 30  '$ GLUCOSE 97 94 93  BUN '9 13 13  '$ CREATININE 1.15 1.25* 1.26*  CALCIUM 9.2 9.2 9.2  MG 1.9 2.3 2.3  GFRNONAA >60 59* 59*  ANIONGAP '8 8 8     '$ Lipids No results for input(s): "CHOL", "TRIG", "HDL", "LABVLDL", "LDLCALC", "CHOLHDL" in the last 168 hours.  Hematology Recent Labs  Lab 07/04/22 1433  WBC 5.5  RBC 5.92*  HGB 17.0  HCT 52.3*  MCV 88.3  MCH 28.7  MCHC 32.5  RDW 14.0  PLT 170   Thyroid  Recent Labs  Lab 07/04/22 1515  TSH 1.342    BNP Recent Labs  Lab 07/04/22 1515  BNP 109.2*    DDimer No results for input(s): "DDIMER" in the last 168 hours.   Radiology     Cardiac Studies   07/05/22: TTE 1. Left ventricular ejection fraction, by estimation, is 60 to 65%. The  left ventricle has normal function. The left ventricle has no regional  wall motion abnormalities. There is mild left ventricular hypertrophy.  Indeterminate diastolic filling due to  E-A fusion.   2. Right ventricular systolic function is normal. The right ventricular  size is normal. There is normal pulmonary artery systolic pressure. The  estimated right ventricular systolic pressure is 31.1  mmHg.   3. Left atrial size was moderately dilated.   4. The mitral valve is grossly normal. Trivial mitral valve  regurgitation.   5. The aortic valve is tricuspid. Aortic valve regurgitation is not  visualized. Aortic valve sclerosis is present, with no evidence of aortic  valve stenosis.   6. The inferior vena cava is normal in size with greater than 50%  respiratory variability, suggesting right atrial pressure of 3 mmHg.   Comparison(s): Changes from prior study are noted. 10/31/2021: LVEF  50-55%, inferobasal hypokinesis.    10/31/21: TTE IMPRESSIONS   1. Inferior basal hypokinesis . Left ventricular ejection fraction, by  estimation, is 50 to 55%. The left ventricle has low normal function. The  left ventricle demonstrates regional wall motion abnormalities (see  scoring diagram/findings for  description). There is mild left ventricular hypertrophy. Left ventricular  diastolic parameters were normal.   2. Right ventricular  systolic function is normal. The right ventricular  size is normal. There is normal pulmonary artery systolic pressure.   3. Left atrial size was mildly dilated.   4. The mitral valve is degenerative. Mild mitral valve regurgitation. No  evidence of mitral stenosis. Moderate mitral annular calcification.   5. The aortic valve is tricuspid. Aortic valve regurgitation is not  visualized. Aortic valve sclerosis/calcification is present, without any  evidence of aortic stenosis.   6. The inferior vena cava is normal in size with greater than 50%  respiratory variability, suggesting right atrial pressure of 3 mmHg.    Nov 2022, monitor (7 days) Predominant rhythm was sinus rhythm 1% ventricular ectopy 6.2% supraventricular ectopy Multiple SVT episodes, all less than 20 seconds No symptoms recorded     Nov 2021 Monitoring Max 215 bpm 02:28pm, 10/28 Min 82 bpm 11:16am, 10/28 Avg 112 bpm 3.3% PVC burden, rare supraventricular ectopy Atrial fibrillation/flutter occurred 100% of the time Longest ventricular run 10 seconds at 161 bpm No symptoms recorded   02/24/2018: stress myoview Nuclear stress EF: 58%. Normal perfusion. No ischemia This is a low risk study.       02/24/2018: TTE Study Conclusions  - Left ventricle: The cavity size was normal. Systolic function was    normal. The estimated ejection fraction was in the range of 55%    to 60%. Wall motion was normal; there were no regional wall    motion abnormalities. There was a reduced contribution of atrial    contraction to ventricular filling, due to increased ventricular    diastolic pressure or atrial contractile dysfunction. The study    is not technically sufficient to allow evaluation of LV diastolic    function. (suspect left atrial mechanical failure, possible    stunning after recent episode of atrial fibrillation versus    chronic atrial dysfunction)  - Left atrium: The atrium was mildly dilated.  - Right ventricle:  The cavity size was mildly dilated. Wall    thickness was normal.  - Right atrium: The atrium was mildly dilated.  - Pulmonary arteries: Systolic pressure was moderately increased.    PA peak pressure: 60 mm Hg (S).    6./15/18: 48 hor holter Minimum HR: 37 BPM at 12:18:54 PM Maximum HR: 124 BPM at 7:11:26 PM Average HR: 50 BPM 1% ventricular beats 4% supraventricular beats Ventricular runs of 6 beats Supraventricular runs of 13 beats   Zero atrial fibrillaiton noted on monitor. Average HR 50. No med changes at this time.  Kamauri Denardo [name], MD   10/01/16 TTE - Left ventricle:  The cavity size was normal. Systolic function was   normal. The estimated ejection fraction was in the range of 55%   to 60%. Wall motion was normal; there were no regional wall   motion abnormalities. Doppler parameters are consistent with a   reversible restrictive pattern, indicative of decreased left   ventricular diastolic compliance and/or increased left atrial   pressure (grade 3 diastolic dysfunction). Doppler parameters are   consistent with high ventricular filling pressure. - Aortic valve: Transvalvular velocity was within the normal range.   There was no stenosis. - Mitral valve: Transvalvular velocity was within the normal range.   There was no evidence for stenosis. There was mild regurgitation. - Right ventricle: The cavity size was mildly dilated. Wall   thickness was normal. Systolic function was normal. - Atrial septum: No defect or patent foramen ovale was identified   by color flow Doppler. - Tricuspid valve: There was mild regurgitation. - Pulmonary arteries: Systolic pressure was moderately to severely   increased. PA peak pressure: 57 mm Hg (S). - Pericardium, extracardiac: A trivial pericardial effusion was   identified.   10/10/15: lexiscan stress myoview There was no ST segment deviation noted during stress. No T wave inversion was noted during stress. The study is normal. This  is a low risk study.  Low risk stress nuclear study with normal perfusion. Non gated study.  Patient Profile     76 y.o. male  with a hx of  who is being seen 07/05/2022 for the evaluation of AFib, HTN, CVA,  HLD, syncope attributed to orthostasis in 2017, h/o medication non complinace, another stroke Feb 2022 (compliant with his [facility] at the time)  >> polycythemia, RBBB, chronic belly/GI issues w/diagnosis of Dyssynergic defecation, follows with GI outpt admitted with HTN and AFlutter w/RVR   AFib Hx Diagnosed 2017   AAD hx Started amiodarone March 2019 >> seems self stopped sometime in the Fall 2021 2/2 making him feel cold and lowered his pulse Toprol was stopped by the patient making him feel bad, poorly tolerated Tikosyn started Dec 2022  Assessment & Plan    Paroxysmal AFib CHA2DS2Vasc is 5, on Eliquis, appropriately dosed Tikosyn Maintaining SR post DCCV yesterday Continue his Tikosyn home dose and Eliquis, and his PRN lopressor    HTN Better   Edema Resolved  4. Belly complaints GI saw him yesterday Stable GI-wise   Dr. [name] has seen him this morning OK to discharge from our perspective when ready medically otherwise. Follow up next week is in place.  For questions or updates, please contact [place] Please consult [email or web address] for contact info under        Signed, Baldwin Jamaica, PA-C  07/07/2022, 9:02 AM    I have seen and examined this patient with Tommye Standard.  Agree with above, note added to reflect my findings.  Post cardioversion yesterday.  Back in normal rhythm.  Feeling much improved with less fatigue and shortness of breath.  GEN: Well nourished, well developed, in no acute distress  HEENT: normal  Neck: no JVD, carotid bruits, or masses Cardiac: RRR; no murmurs, rubs, or gallops,no edema  Respiratory:  clear to auscultation bilaterally, normal work of breathing GI: soft, nontender, nondistended, + BS MS: no deformity or atrophy   Skin: warm and dry Neuro:  Strength and sensation are intact Psych: euthymic mood, full affect   Persistent atrial fibrillation/flutter: Currently on dofetilide 500 mcg twice daily.  He is post cardioversion yesterday.  He is  back in normal rhythm feeling much improved.  No further work-up needed per EP.  Would discharge on current medications.  We Jamey Harman arrange for follow-up in clinic.  Myelle Poteat M. Diogo Anne MD 07/07/2022 11:18 AM

## 2022-07-09 ENCOUNTER — Ambulatory Visit (INDEPENDENT_AMBULATORY_CARE_PROVIDER_SITE_OTHER): Payer: Medicare PPO | Admitting: *Deleted

## 2022-07-09 VITALS — BP 121/67 | HR 57

## 2022-07-09 DIAGNOSIS — I480 Paroxysmal atrial fibrillation: Secondary | ICD-10-CM | POA: Diagnosis not present

## 2022-07-09 DIAGNOSIS — I4892 Unspecified atrial flutter: Secondary | ICD-10-CM

## 2022-07-09 NOTE — Patient Instructions (Signed)
Medication Instructions:  Your physician recommends that you continue on your current medications as directed. Please refer to the Current Medication list given to you today.  *If you need a refill on your cardiac medications before your next appointment, please call your pharmacy*   Lab Work: None ordered   Testing/Procedures: None ordered   Follow-Up: At [facility], you and your health needs are our priority.  As part of our continuing mission to provide you with exceptional heart care, we have created designated Provider Care Teams.  These Care Teams include your primary Cardiologist (physician) and Advanced Practice Providers (APPs -  Physician Assistants and Nurse Practitioners) who all work together to provide you with the care you need, when you need it.  Your next appointment:   Keep scheduled appointment  next Friday  The format for your next appointment:   In Person  Provider:   Tommye Standard, PA-C    Thank you for choosing [facility]!!   Trinidad Curet, RN [phone]  Other Instructions   Important Information About Sugar

## 2022-07-09 NOTE — Progress Notes (Signed)
   Nurse Visit   Date of Encounter: 07/09/2022 ID: NATALIO SALOIS, DOB [birth year], MRN [id number]  PCP:  Allwardt, [name], PA-C   [facility] Providers Cardiologist:  Mertie Moores, MD Electrophysiologist:  Constance Haw, MD {    Visit Details   VS:  BP 121/67 (BP Location: Left Arm, Patient Position: Sitting, Cuff Size: Normal)   Pulse (!) 57  , BMI There is no height or weight on file to calculate BMI.  Wt Readings from Last 3 Encounters:  07/06/22 210 lb 1.6 oz (95.3 kg)  07/02/22 210 lb (95.3 kg)  06/16/22 211 lb 3.2 oz (95.8 kg)     Reason for visit: EKG per PA Performed today: Vitals, EKG, and Provider consulted:[place] Changes (medications, testing, etc.) : none Length of Visit: 20 minutes  EKG performed today showed SB w/ 1st degree AVB HR 54      PR 238, QRS 154, QT/QTc 532/504 Pt advised to keep follow up next Friday.   Medications Adjustments/Labs and Tests Ordered: No orders of the defined types were placed in this encounter.  No orders of the defined types were placed in this encounter.    Signed, Stanton Kidney, RN  07/09/2022 12:27 PM

## 2022-07-11 ENCOUNTER — Emergency Department (HOSPITAL_COMMUNITY): Payer: Medicare PPO

## 2022-07-11 ENCOUNTER — Other Ambulatory Visit: Payer: Self-pay

## 2022-07-11 ENCOUNTER — Encounter (HOSPITAL_COMMUNITY): Payer: Self-pay

## 2022-07-11 ENCOUNTER — Emergency Department (HOSPITAL_COMMUNITY)
Admission: EM | Admit: 2022-07-11 | Discharge: 2022-07-11 | Disposition: A | Discharge status: Home / Self Care | Payer: Medicare PPO | Attending: Student | Admitting: Student

## 2022-07-11 DIAGNOSIS — R200 Anesthesia of skin: Secondary | ICD-10-CM | POA: Diagnosis not present

## 2022-07-11 DIAGNOSIS — I10 Essential (primary) hypertension: Secondary | ICD-10-CM | POA: Insufficient documentation

## 2022-07-11 DIAGNOSIS — R079 Chest pain, unspecified: Secondary | ICD-10-CM | POA: Insufficient documentation

## 2022-07-11 DIAGNOSIS — K449 Diaphragmatic hernia without obstruction or gangrene: Secondary | ICD-10-CM | POA: Diagnosis not present

## 2022-07-11 DIAGNOSIS — R000 Tachycardia, unspecified: Secondary | ICD-10-CM | POA: Diagnosis not present

## 2022-07-11 DIAGNOSIS — Z79899 Other long term (current) drug therapy: Secondary | ICD-10-CM | POA: Insufficient documentation

## 2022-07-11 DIAGNOSIS — D72819 Decreased white blood cell count, unspecified: Secondary | ICD-10-CM | POA: Diagnosis not present

## 2022-07-11 DIAGNOSIS — R0789 Other chest pain: Secondary | ICD-10-CM | POA: Diagnosis not present

## 2022-07-11 DIAGNOSIS — I4891 Unspecified atrial fibrillation: Secondary | ICD-10-CM | POA: Diagnosis not present

## 2022-07-11 LAB — CBC
HCT: 50.5 % (ref 39.0–52.0)
Hemoglobin: 16.4 g/dL (ref 13.0–17.0)
MCH: 29 pg (ref 26.0–34.0)
MCHC: 32.5 g/dL (ref 30.0–36.0)
MCV: 89.4 fL (ref 80.0–100.0)
Platelets: 178 10*3/uL (ref 150–400)
RBC: 5.65 MIL/uL (ref 4.22–5.81)
RDW: 13.9 % (ref 11.5–15.5)
WBC: 3.7 10*3/uL — ABNORMAL LOW (ref 4.0–10.5)
nRBC: 0 % (ref 0.0–0.2)

## 2022-07-11 LAB — COMPREHENSIVE METABOLIC PANEL
ALT: 15 U/L (ref 0–44)
AST: 25 U/L (ref 15–41)
Albumin: 4.1 g/dL (ref 3.5–5.0)
Alkaline Phosphatase: 84 U/L (ref 38–126)
Anion gap: 8 (ref 5–15)
BUN: 10 mg/dL (ref 8–23)
CO2: 26 mmol/L (ref 22–32)
Calcium: 9.3 mg/dL (ref 8.9–10.3)
Chloride: 105 mmol/L (ref 98–111)
Creatinine, Ser: 1.26 mg/dL — ABNORMAL HIGH (ref 0.61–1.24)
GFR, Estimated: 59 mL/min — ABNORMAL LOW (ref >60)
Glucose, Bld: 107 mg/dL — ABNORMAL HIGH (ref 70–99)
Potassium: 4.2 mmol/L (ref 3.5–5.1)
Sodium: 139 mmol/L (ref 135–145)
Total Bilirubin: 1.3 mg/dL — ABNORMAL HIGH (ref 0.3–1.2)
Total Protein: 7.2 g/dL (ref 6.5–8.1)

## 2022-07-11 LAB — TROPONIN I (HIGH SENSITIVITY)
Troponin I (High Sensitivity): 12 ng/L (ref <18)
Troponin I (High Sensitivity): 15 ng/L (ref <18)

## 2022-07-11 LAB — MAGNESIUM: Magnesium: 1.9 mg/dL (ref 1.7–2.4)

## 2022-07-11 MED ORDER — IOHEXOL 350 MG/ML SOLN
100.0000 mL | Freq: Once | INTRAVENOUS | Status: AC | PRN
  Administered 2022-07-11: 100 mL via INTRAVENOUS

## 2022-07-11 MED ORDER — LORAZEPAM 1 MG PO TABS
0.5000 mg | ORAL_TABLET | Freq: Once | ORAL | Status: AC
  Administered 2022-07-11: 0.5 mg via ORAL
  Filled 2022-07-11: qty 1

## 2022-07-11 MED ORDER — HYDROXYZINE HCL 25 MG PO TABS: 25.0000 mg | ORAL_TABLET | Freq: Four times a day (QID) | ORAL | 0 refills | Status: DC | PRN

## 2022-07-11 NOTE — ED Provider Notes (Signed)
[facility] EMERGENCY DEPARTMENT Provider Note  CSN: [id number] Arrival date & time: 07/11/22 1207  Chief Complaint(s) Chest Pain  HPI Patrick Roth is a 76 y.o. male with PMH A-fib/flutter on Tikosyn status post ablation on 07/06/2022, HTN, HLD, sciatica who presents emergency department for evaluation of abnormal blood pressure.  Chief complaint states chest pain but the patient is denying any chest pain today.  From previous chart review it appears the patient has had similar symptoms where he is describing a postprandial sensation of bilateral lower extremity swelling and discomfort.  He states that this has been worse since hospital discharge and he took his home blood pressures where he found his diastolic blood pressure to be greater than 100 bringing him to the hospital today.  On chart review, this issue appears to be last addressed in April 2023 by [facility] who performed a flex sig which was reassuringly negative.  Here in the emergency department, he denies chest pain, shortness of breath, headache, fever or other systemic symptoms.  He states that he just feels "shaky".  Blood pressure stable.   Past Medical History Past Medical History:  Diagnosis Date   Atrial fibrillation/flutter    Intol of amio, beta blocker // Echo 12/22: inf HK, EF 50-55, normal RVSF, mild LaE, mild MR, mod MAC, AV sclerosis w/o aS   Colon polyps    Constipation 05/27/2015   DOE (dyspnea on exertion) 10/13/2015   Faintness 06/20/2016   History of chicken pox    Hyperlipidemia    Hypertension    Internal hemorrhoids    Polycythemia    Sciatica    Stroke ([place])    Hx of stroke in2008; recurrent CVA in 12/2020   Syncope and collapse 06/28/2016   Patient Active Problem List   Diagnosis Date Noted   Abnormal CT of the abdomen    Atrial fibrillation with RVR ([place]) 07/04/2022   Chest pain 07/04/2022   Afib ([place]) 11/10/2021   Dyssynergic defecation 11/01/2021   GERD  (gastroesophageal reflux disease) 11/01/2021   PAF (paroxysmal atrial fibrillation) ([place]) 10/31/2021   Atrial fibrillation with rapid ventricular response (HCC)    Aortic atherosclerosis ([place]) 10/28/2021   Wide-complex tachycardia 10/28/2021   Rectal sphincter spasm 10/28/2021   Paroxysmal atrial fibrillation ([place]) 05/26/2021   Polycythemia 05/26/2021   Statin myopathy 01/29/2021   Acute arterial ischemic stroke, multifocal, anterior circulation, left ([place]) 01/16/2021   Secondary hypercoagulable state ([place]) 09/11/2020   Cyst of perineum in male 11/28/2018   Hypertension    Mixed hyperlipidemia    History of chicken pox    Colon polyps    Atrial flutter with rapid ventricular response ([place])    Other intervertebral disc degeneration, lumbar region 05/09/2017   Lumbar radiculopathy 03/29/2017   Persistent atrial fibrillation ([place]) 09/14/2016   Diuretic-induced hypokalemia 06/28/2016   Essential hypertension 05/27/2015   Constipation due to pain medication 05/27/2015   Mallet deformity of fourth finger, right 07/11/2013   Stroke ([place]) 08/23/2012   Home Medication(s) Prior to Admission medications   Medication Sig Start Date End Date Taking? Authorizing Provider  amLODipine (NORVASC) 10 MG tablet Take 10 mg by mouth daily.    [provider]  apixaban (ELIQUIS) 5 MG TABS tablet Take 1 tablet by mouth twice daily Patient taking differently: Take 5 mg by mouth 2 (two) times daily. 02/19/22   Camnitz, Ocie Doyne, MD  dofetilide (TIKOSYN) 250 MCG capsule Take 1 capsule by mouth twice daily 06/11/22  Fenton, Clint R, PA  furosemide (LASIX) 20 MG tablet Take 1 tablet (20 mg total) by mouth daily. 06/16/22   Baldwin Jamaica, PA-C  metoprolol tartrate (LOPRESSOR) 25 MG tablet Take 25 mg by mouth as needed (if Heart right goes above 125).    [provider]  oxyCODONE-acetaminophen (PERCOCET/ROXICET) 5-325 MG tablet Take 1 tablet by mouth every 8 (eight) hours as needed for  moderate pain. 03/18/22   [provider]  polyethylene glycol powder (GLYCOLAX/MIRALAX) 17 GM/SCOOP powder Take 17 g by mouth in the morning and at bedtime. Start with 1 scoop 2 times per day until bowels are moving, then reduce to 1 scoop daily. Drink 64oz of water daily. Patient taking differently: Take 17 g by mouth daily as needed for mild constipation. 09/18/21   Jeanie Sewer, NP  potassium chloride SA (KLOR-CON M) 20 MEQ tablet TAKE 1 TABLET BY MOUTH IN THE MORNING AND TAKE 2 TABLETS IN THE EVENING Patient taking differently: Take 20 mEq by mouth 3 (three) times daily. 05/19/22   Allwardt, [name], PA-C  pregabalin (LYRICA) 50 MG capsule Take 50 mg by mouth 3 (three) times daily. 04/20/22   [provider]  REPATHA SURECLICK 295 MG/ML SOAJ INJECT 1 PEN INTO THE SKIN EVERY 14 (FOURTEEN) DAYS Patient taking differently: Inject 140 mg into the skin every 14 (fourteen) days. 01/07/22   Camnitz, Ocie Doyne, MD  Vitamin D, Ergocalciferol, (DRISDOL) 1.25 MG (50000 UNIT) CAPS capsule Take 1 capsule (50,000 Units total) by mouth every 7 (seven) days. Patient taking differently: Take 50,000 Units by mouth every 7 (seven) days. Wednesday 05/10/22   Allwardt, [name], PA-C                                                                                                                                    Past Surgical History Past Surgical History:  Procedure Laterality Date   CARDIOVERSION N/A 09/02/2017   Procedure: CARDIOVERSION;  Surgeon: [name], MD;  Location: [facility] ENDOSCOPY;  Service: Cardiovascular;  Laterality: N/A;   CARDIOVERSION N/A 07/06/2022   Procedure: CARDIOVERSION;  Surgeon: Freada Bergeron, MD;  Location: [facility] ENDOSCOPY;  Service: Cardiovascular;  Laterality: N/A;   COLONOSCOPY  2011   great toe surgery     PROSTATE BIOPSY     Family History Family History  Problem Relation Age of Onset   Hypertension Father 62       Deceased   Heart disease Father     Lung cancer Mother 70       Deceased   Healthy Sister        x3   Healthy Brother        x4   Heart disease Brother        #5   Other Daughter        Alpha Thalassemia   Lupus Daughter        #2-deceased  Diabetes Neg Hx    Heart attack Neg Hx    Hyperlipidemia Neg Hx    Sudden death Neg Hx     Social History Social History   Tobacco Use   Smoking status: Never   Smokeless tobacco: Never   Tobacco comments:    Never smoke 03/11/22  Vaping Use   Vaping Use: Never used  Substance Use Topics   Alcohol use: No   Drug use: No   Allergies Crestor [rosuvastatin calcium], Spironolactone, Terazosin, Amiodarone, Atenolol, Bystolic [nebivolol hcl], Gabapentin, Hydralazine, Tizanidine, Clonidine, Hydrocodone-acetaminophen, Levofloxacin, Losartan, Maxzide [hydrochlorothiazide w-triamterene], Olmesartan, and Tussionex pennkinetic er [hydrocod poli-chlorphe poli er]  Review of Systems Review of Systems  Gastrointestinal:        Abdominal discomfort  Psychiatric/Behavioral:  The patient is nervous/anxious.     Physical Exam Vital Signs  I have reviewed the triage vital signs BP (!) 137/94   Pulse 83   Temp 97.6 F (36.4 C) (Oral)   Resp (!) 23   SpO2 100%   Physical Exam Constitutional:      General: He is not in acute distress.    Appearance: Normal appearance.  HENT:     Head: Normocephalic and atraumatic.     Nose: No congestion or rhinorrhea.  Eyes:     General:        Right eye: No discharge.        Left eye: No discharge.     Extraocular Movements: Extraocular movements intact.     Pupils: Pupils are equal, round, and reactive to light.  Cardiovascular:     Rate and Rhythm: Normal rate and regular rhythm.     Heart sounds: No murmur heard. Pulmonary:     Effort: No respiratory distress.     Breath sounds: No wheezing or rales.  Abdominal:     General: There is no distension.     Tenderness: There is no abdominal tenderness.  Musculoskeletal:         General: Normal range of motion.     Cervical back: Normal range of motion.  Skin:    General: Skin is warm and dry.  Neurological:     General: No focal deficit present.     Mental Status: He is alert.     ED Results and Treatments Labs (all labs ordered are listed, but only abnormal results are displayed) Labs Reviewed  CBC - Abnormal; Notable for the following components:      Result Value   WBC 3.7 (*)    All other components within normal limits  COMPREHENSIVE METABOLIC PANEL - Abnormal; Notable for the following components:   Glucose, Bld 107 (*)    Creatinine, Ser 1.26 (*)    Total Bilirubin 1.3 (*)    GFR, Estimated 59 (*)    All other components within normal limits  MAGNESIUM  TROPONIN I (HIGH SENSITIVITY)  TROPONIN I (HIGH SENSITIVITY)  Radiology DG Chest 2 View  Result Date: 07/11/2022 CLINICAL DATA:  Chest pain. Prior ablation on 07/06/2022. Right-sided neck numbness. EXAM: CHEST - 2 VIEW COMPARISON:  07/04/2022 FINDINGS: Midline trachea. Normal heart size. Tortuous thoracic aorta. No pleural effusion or pneumothorax. Clear lungs. IMPRESSION: No active cardiopulmonary disease. Electronically Signed   By: Abigail Miyamoto M.D.   On: 07/11/2022 13:12    Pertinent labs & imaging results that were available during my care of the patient were reviewed by me and considered in my medical decision making (see MDM for details).  Medications Ordered in ED Medications  LORazepam (ATIVAN) tablet 0.5 mg (has no administration in time range)                                                                                                                                     Procedures Procedures  (including critical care time)  Medical Decision Making / ED Course   This patient presents to the ED for concern of hypertension, abdominal discomfort after  eating, this involves an extensive number of treatment options, and is a complaint that carries with it a high risk of complications and morbidity.  The differential diagnosis includes chronic mesenteric ischemia, acute mesenteric ischemia, anxiety, constipation, intra-abdominal infection, dysmotility  MDM: Seen in the emergency room for evaluation of elevated blood pressure and a abdominal discomfort after eating.  Physical exam is unremarkable.  Laboratory evaluation unremarkable outside of mild creatinine elevation to 1.26 which is baseline for this patient, mild leukopenia to 3.7.  High-sensitivity troponin is normal.  Chest x-ray unremarkable.  A CT angio abdomen pelvis was obtained to evaluate for mesenteric ischemia which was reassuringly unremarkable.  There is severe prostatomegaly of which the patient is currently being followed by urology for it and he states that his urinary stream has never been better.  We did a small Ativan trial and patient's symptoms appear to have improved.  Suspect a small amount of anxiety around these symptoms, but he currently does not meet inpatient criteria for admission is safe for discharge with outpatient follow-up.  Patient then discharged with a prescription for Atarax as needed.  Of note, patient continued to not have any chest pain while here in the emergency department and no associated shortness of breath.   Additional history obtained: -Additional history obtained from wife -External records from outside source obtained and reviewed including: Chart review including previous notes, labs, imaging, consultation notes   Lab Tests: -I ordered, reviewed, and interpreted labs.   The pertinent results include:   Labs Reviewed  CBC - Abnormal; Notable for the following components:      Result Value   WBC 3.7 (*)    All other components within normal limits  COMPREHENSIVE METABOLIC PANEL - Abnormal; Notable for the following components:   Glucose, Bld 107  (*)    Creatinine, Ser 1.26 (*)    Total Bilirubin 1.3 (*)  GFR, Estimated 59 (*)    All other components within normal limits  MAGNESIUM  TROPONIN I (HIGH SENSITIVITY)  TROPONIN I (HIGH SENSITIVITY)      EKG   EKG Interpretation  Date/Time:  Sunday July 11 2022 12:24:13 EDT Ventricular Rate:  79 PR Interval:    QRS Duration: 158 QT Interval:  446 QTC Calculation: 511 R Axis:   -62 Text Interpretation: Normal sinus rhythm with PACs Left axis deviation Right bundle branch block Inferior infarct , age undetermined Abnormal ECG When compared with ECG of 06-Jul-2022 10:27, PREVIOUS ECG IS PRESENT Confirmed by Dow Blahnik ([id number]) on 07/11/2022 5:15:30 PM         Imaging Studies ordered: I ordered imaging studies including CT angio abdomen pelvis I independently visualized and interpreted imaging. I agree with the radiologist interpretation   Medicines ordered and prescription drug management: Meds ordered this encounter  Medications   LORazepam (ATIVAN) tablet 0.5 mg    -I have reviewed the patients home medicines and have made adjustments as needed  Critical interventions none  Cardiac Monitoring: The patient was maintained on a cardiac monitor.  I personally viewed and interpreted the cardiac monitored which showed an underlying rhythm of: NSR  Social Determinants of Health:  Factors impacting patients care include: none   Reevaluation: After the interventions noted above, I reevaluated the patient and found that they have :improved  Co morbidities that complicate the patient evaluation  Past Medical History:  Diagnosis Date   Atrial fibrillation/flutter    Intol of amio, beta blocker // Echo 12/22: inf HK, EF 50-55, normal RVSF, mild LaE, mild MR, mod MAC, AV sclerosis w/o aS   Colon polyps    Constipation 05/27/2015   DOE (dyspnea on exertion) 10/13/2015   Faintness 06/20/2016   History of chicken pox    Hyperlipidemia    Hypertension    Internal  hemorrhoids    Polycythemia    Sciatica    Stroke ([place])    Hx of stroke in2008; recurrent CVA in 12/2020   Syncope and collapse 06/28/2016      Dispostion: I considered admission for this patient, but he currently does not meet inpatient criteria for admission and he is safe for discharge with outpatient follow-up     Final Clinical Impression(s) / ED Diagnoses Final diagnoses:  None     _0 @    Teressa Lower, MD 07/11/22 2017

## 2022-07-11 NOTE — ED Triage Notes (Signed)
Pt BIB [facility] from home c/o centralized CP that he describes as pressure that radiates to his back. Pt did have an ablation on 07/06/22.

## 2022-07-11 NOTE — ED Provider Triage Note (Signed)
Emergency Medicine Provider Triage Evaluation Note  Patrick Roth , a 76 y.o. male  was evaluated in triage.  Pt complains of chest pain onset this morning.  Denies previous history of CAD.  Reports history of CVA.  Denies associated shortness of breath, lightheadedness, palpitations.  Review of Systems  Positive: As above Negative: As above  Physical Exam  There were no vitals taken for this visit. Gen:   Awake, no distress   Resp:  Normal effort  MSK:   Moves extremities without difficulty  Other:    Medical Decision Making  Medically screening exam initiated at 12:16 PM.  Appropriate orders placed.  Patrick Roth was informed that the remainder of the evaluation will be completed by another provider, this initial triage assessment does not replace that evaluation, and the importance of remaining in the ED until their evaluation is complete.     Evlyn Courier, PA-C 07/11/22 1217

## 2022-07-15 NOTE — Progress Notes (Signed)
Cardiology Office Note Date:  07/15/2022  Patient ID:  Patrick Roth, Patrick Roth 1946/07/06, MRN [id number] PCP:  [name], PA-C  Cardiologist:  Dr. Acie Fredrickson Electrophysiologist: Dr. [name]    Chief Complaint:   post hospital  History of Present Illness: Patrick Roth is a 76 y.o. male with history of  AFib, HTN, CVA,  HLD, syncope attributed to orthostasis in 2017, h/o medication non complinace, another stroke Feb 2022 (compliant with his [place] at the time)  >> polycythemia, RBBB    He had an ER visit 12/6, with near syncope associated with straining to have a BM, his HR went up and he took a couple doses of PRN lopressor went to the ER he was in AFib 120s, his HR settled to 90's-110's, he was feeling well and sent home He returned 12/7 and was admitted this time though it seems for diarrhea, not near syncope He was in AFib w/RVR and rates getting to 200's, he got lopressor in the ER with improvement in his HRs, admitted for further management   He was seen by GI who recommended outpt follow up for  Dyssynergic defecation - Outpatient anorectal manometry, pelvic floor physical therapy, can be coordinated by primary GI as outpatient - Daily Citrucel Globus sensation - Pepcid 20 mg PO daily for 4 weeks - Outpatient EGD with primary GI as previously planned  Had an abnormal barium swallow to follow put pt as well for this  During his stay he had recurrent symptoms of weakness and feeling near faint, these occurred at rest with GI symptoms and normal HRs/BP AND with exertion and RVR to 200s His chlorthalidone stopped with plans to admit 12/20 for Tikosyn, and started on diltiazem, titrated and discharged 11/01/21 with improved HR control with plans to keep his f/u for repeat BMET, started on K+ supplementation  His symptoms felt to be multifactorial with RVR and GI/vagal  I saw him Dec 2022 He is doing OK, his HR has been good, his back/hip though are hurting (a known problem for  him) No near fainting spells, no palpitations, no CP No bleeding or signs of bleeding He has the AFib clinic and admission instructions He confirms he is NOT taking chlorthalidone He confirms his IS taking his eliquis without missed doses He was planned to get an EGD soon, he will need to wait until after his Tikosyn admission for 1 month of uninterrupted a/c, I discussed this with him. Planned for chlorthalidone washout and Tikosyn admission  He saw Dr. [name] 12/11/21, doing well, minimal AF burden, still struggling with GI issues which is a provoker for his AF  He had an PMD visit April 2023 with abdominal complaints found in AFlutter ad referred to the ER, pt did not want DCCV given it would further delay his GI w/u and minimally if any symptoms with the flutter Saw the AFib clinic 4/20 back in [place]   I saw him 06/16/22 Continues to struggle some with abdominal cramping/occasional difficulty with BM, though is better. Also struggling with hemorrhoid pain that seems to trigger feet pain/burning and has been told is a neuropathy. Uncertain of that connection, he follows with his PMD and GI Cardiac-wise he feels much improved on the Tikosyn, feels his AF burden is significantly improved and only now triggered by bouts of his GI discomfort. He had some trouble with his BM this AM and felt that he went out of rhythm, but otherwise not symptomatic with this Reports eliquis and Tikosyn  compliance No bleeding No CP No near syncope or syncope. He was in [place], rate controlled, he could tell and reported that on Tikosyn his AF behavior since on Tikosyn is that it will correct again. Planned to avoid daily nodal blocker with baseline conduction system disease. PRN [place] was working well. Edema perhaps 2/2 amlodipine started on low dose lasix for his edema, planned for close labs and early follow up  I saw him 07/02/22 He continues to struggle with GI stuff Reports immediate belly  bloating/cramping with beef Continues to have a lot of rectal pressure and difficult BM's and this always provokes his AFib and fast rates. He is certain that he is not always in AFib/flutter and can tell. He knew his heart was racing can feel it but assures me that he does not feel bad. Denies CP, SOB, not dizzy or weak He enthusiastically tells me that his heart rhythm control is the best it has ever been with the Tikosyn and very much wants to continue  Edema not much better, this as well he attributes to the Ams issue as well, and says it is typically better now towards mid-day Was in Afib 161bpm, advised to go to the ER but he felt well and declined, certain it wuld improve after home and having a BM.  Admitted 07/04/22 - 07/07/22 AFib w/RVR He was DCCV 07/06/22, again firmly felt tikosyn gave him significant improvement in his AF burden. Again felt GI issues triggered his AF and was seen by GI, though note suggests reports of stable symptoms  Discussed CT with the patient, and he is reassured that there is no evidence of any foreign body in his bowel, advised to continue MiraLAX 1-2 doses per day, and to make a follow-up appointment with [facility], whom he has seen previously.  ER visit 07/11/22 with high BP, there is mention of CP but he denied that, + for GI discomfort, felt shaky, CT abdomen reassuring, labs as well, suspected component of anxiety and did feel better after a dose of ativan and discharged with PRN atarax.  Note clear again at discharge, never had c/o CP, was in rate controlled AF  TODAY He is doing OK He again says he was aware of the AFib, started today when he was doing some work in the yard. He was in Afib at the ER, we discussed the frequency of his AFib and suspect that Tikosyn may be failing though he again is very much happy with the current burden of his AF He reports that he has a few days without AFib as long as his GI system is doing well. He again is certain  that this triggers his AF not the other way around. No bleeding or signs of bleeding No CP or SOB Reprts excellent medication compliance.  He says his AFib typically lasts a couple hours only  He reports he took only 2 doses of the hydroxyzine, did not seem to help     AFib Hx Diagnosed 2017   AAD hx Started amiodarone March 2019 >> seems self stopped sometime in the Fall 2021 2/2 making him feel cold and lowered his pulse Toprol was stopped by the patient making him feel bad, poorly tolerated Tikosyn started Dec 2022  Past Medical History:  Diagnosis Date   Atrial fibrillation/flutter    Intol of amio, beta blocker // Echo 12/22: inf HK, EF 50-55, normal RVSF, mild LaE, mild MR, mod MAC, AV sclerosis w/o aS   Colon polyps  Constipation 05/27/2015   DOE (dyspnea on exertion) 10/13/2015   Faintness 06/20/2016   History of chicken pox    Hyperlipidemia    Hypertension    Internal hemorrhoids    Polycythemia    Sciatica    Stroke [facility])    Hx of stroke in2008; recurrent CVA in 12/2020   Syncope and collapse 06/28/2016    Past Surgical History:  Procedure Laterality Date   CARDIOVERSION N/A 09/02/2017   Procedure: CARDIOVERSION;  Surgeon: [name], MD;  Location: [place];  Service: Cardiovascular;  Laterality: N/A;   CARDIOVERSION N/A 07/06/2022   Procedure: CARDIOVERSION;  Surgeon: Freada Bergeron, MD;  Location: [facility] ENDOSCOPY;  Service: Cardiovascular;  Laterality: N/A;   COLONOSCOPY  2011   great toe surgery     PROSTATE BIOPSY      Current Outpatient Medications  Medication Sig Dispense Refill   amLODipine (NORVASC) 10 MG tablet Take 10 mg by mouth daily.     apixaban (ELIQUIS) 5 MG TABS tablet Take 1 tablet by mouth twice daily (Patient taking differently: Take 5 mg by mouth 2 (two) times daily.) 60 tablet 10   dofetilide (TIKOSYN) 250 MCG capsule Take 1 capsule by mouth twice daily 60 capsule 0   furosemide (LASIX) 20 MG tablet Take 1 tablet (20  mg total) by mouth daily. 45 tablet 1   hydrOXYzine (ATARAX) 25 MG tablet Take 1 tablet (25 mg total) by mouth every 6 (six) hours as needed for anxiety. 30 tablet 0   metoprolol tartrate (LOPRESSOR) 25 MG tablet Take 25 mg by mouth as needed (if Heart right goes above 125).     oxyCODONE-acetaminophen (PERCOCET/ROXICET) 5-325 MG tablet Take 1 tablet by mouth every 8 (eight) hours as needed for moderate pain.     polyethylene glycol powder (GLYCOLAX/MIRALAX) 17 GM/SCOOP powder Take 17 g by mouth in the morning and at bedtime. Start with 1 scoop 2 times per day until bowels are moving, then reduce to 1 scoop daily. Drink 64oz of water daily. (Patient taking differently: Take 17 g by mouth daily as needed for mild constipation.) 3350 g 1   potassium chloride SA (KLOR-CON M) 20 MEQ tablet TAKE 1 TABLET BY MOUTH IN THE MORNING AND TAKE 2 TABLETS IN THE EVENING (Patient taking differently: Take 20 mEq by mouth 3 (three) times daily.) 270 tablet 0   pregabalin (LYRICA) 50 MG capsule Take 50 mg by mouth 3 (three) times daily.     REPATHA SURECLICK 697 MG/ML SOAJ INJECT 1 PEN INTO THE SKIN EVERY 14 (FOURTEEN) DAYS (Patient taking differently: Inject 140 mg into the skin every 14 (fourteen) days.) 6 mL 3   Vitamin D, Ergocalciferol, (DRISDOL) 1.25 MG (50000 UNIT) CAPS capsule Take 1 capsule (50,000 Units total) by mouth every 7 (seven) days. (Patient taking differently: Take 50,000 Units by mouth every 7 (seven) days. Wednesday) 12 capsule 0   No current facility-administered medications for this visit.    Allergies:   Crestor [rosuvastatin calcium], Spironolactone, Terazosin, Amiodarone, Atenolol, Bystolic [nebivolol hcl], Gabapentin, Hydralazine, Tizanidine, Clonidine, Hydrocodone-acetaminophen, Levofloxacin, Losartan, Maxzide [hydrochlorothiazide w-triamterene], Olmesartan, and Tussionex pennkinetic er [hydrocod poli-chlorphe poli er]   Social History:  The patient  reports that he has never smoked. He has  never used smokeless tobacco. He reports that he does not drink alcohol and does not use drugs.   Family History:  The patient's family history includes Healthy in his brother and sister; Heart disease in his brother and father; Hypertension (age of  onset: 77) in his father; Lung cancer (age of onset: 32) in his mother; Lupus in his daughter; Other in his daughter.  ROS:  Please see the history of present illness.    All other systems are reviewed and otherwise negative.   PHYSICAL EXAM:  VS:  There were no vitals taken for this visit. BMI: There is no height or weight on file to calculate BMI. Well nourished, well developed, in no acute distress HEENT: normocephalic, atraumatic Neck: no JVD, carotid bruits or masses Cardiac: irreg-irreg; no significant murmurs, no rubs, or gallops Lungs:  CTA b/l, no wheezing, rhonchi or rales Abd: soft, nontender MS: no deformity or atrophy Ext:  1-2+ edema R/L to below the knee Skin: warm and dry, no rash Neuro:  No gross deficits appreciated Psych: euthymic mood, full affect   EKG:  done today and reviewed by myself  AFilutter 106bpm, RBBB, manually measured QRS 140-153m, QT 380-4076m QTc I think is acceptable  07/05/22: TTE 1. Left ventricular ejection fraction, by estimation, is 60 to 65%. The  left ventricle has normal function. The left ventricle has no regional  wall motion abnormalities. There is mild left ventricular hypertrophy.  Indeterminate diastolic filling due to  E-A fusion.   2. Right ventricular systolic function is normal. The right ventricular  size is normal. There is normal pulmonary artery systolic pressure. The  estimated right ventricular systolic pressure is 3117.9mHg.   3. Left atrial size was moderately dilated.   4. The mitral valve is grossly normal. Trivial mitral valve  regurgitation.   5. The aortic valve is tricuspid. Aortic valve regurgitation is not  visualized. Aortic valve sclerosis is present, with no  evidence of aortic  valve stenosis.   6. The inferior vena cava is normal in size with greater than 50%  respiratory variability, suggesting right atrial pressure of 3 mmHg.   Comparison(s): Changes from prior study are noted. 10/31/2021: LVEF  50-55%, inferobasal hypokinesis.   10/31/21: TTE IMPRESSIONS   1. Inferior basal hypokinesis . Left ventricular ejection fraction, by  estimation, is 50 to 55%. The left ventricle has low normal function. The  left ventricle demonstrates regional wall motion abnormalities (see  scoring diagram/findings for  description). There is mild left ventricular hypertrophy. Left ventricular  diastolic parameters were normal.   2. Right ventricular systolic function is normal. The right ventricular  size is normal. There is normal pulmonary artery systolic pressure.   3. Left atrial size was mildly dilated.   4. The mitral valve is degenerative. Mild mitral valve regurgitation. No  evidence of mitral stenosis. Moderate mitral annular calcification.   5. The aortic valve is tricuspid. Aortic valve regurgitation is not  visualized. Aortic valve sclerosis/calcification is present, without any  evidence of aortic stenosis.   6. The inferior vena cava is normal in size with greater than 50%  respiratory variability, suggesting right atrial pressure of 3 mmHg.   Nov 2022, monitor (7 days) Predominant rhythm was sinus rhythm 1% ventricular ectopy 6.2% supraventricular ectopy Multiple SVT episodes, all less than 20 seconds No symptoms recorded     Nov 2021 Monitoring Max 215 bpm 02:28pm, 10/28 Min 82 bpm 11:16am, 10/28 Avg 112 bpm 3.3% PVC burden, rare supraventricular ectopy Atrial fibrillation/flutter occurred 100% of the time Longest ventricular run 10 seconds at 161 bpm No symptoms recorded       02/24/2018: stress myoview Nuclear stress EF: 58%. Normal perfusion. No ischemia This is a low risk study.  02/24/2018: TTE Study Conclusions   - Left ventricle: The cavity size was normal. Systolic function was    normal. The estimated ejection fraction was in the range of 55%    to 60%. Wall motion was normal; there were no regional wall    motion abnormalities. There was a reduced contribution of atrial    contraction to ventricular filling, due to increased ventricular    diastolic pressure or atrial contractile dysfunction. The study    is not technically sufficient to allow evaluation of LV diastolic    function. (suspect left atrial mechanical failure, possible    stunning after recent episode of atrial fibrillation versus    chronic atrial dysfunction)  - Left atrium: The atrium was mildly dilated.  - Right ventricle: The cavity size was mildly dilated. Wall    thickness was normal.  - Right atrium: The atrium was mildly dilated.  - Pulmonary arteries: Systolic pressure was moderately increased.    PA peak pressure: 60 mm Hg (S).    6./15/18: 48 hor holter Minimum HR: 37 BPM at 12:18:54 PM Maximum HR: 124 BPM at 7:11:26 PM Average HR: 50 BPM 1% ventricular beats 4% supraventricular beats Ventricular runs of 6 beats Supraventricular runs of 13 beats   Zero atrial fibrillaiton noted on monitor. Average HR 50. No med changes at this time.  Will [name], MD   10/01/16 TTE - Left ventricle: The cavity size was normal. Systolic function was   normal. The estimated ejection fraction was in the range of 55%   to 60%. Wall motion was normal; there were no regional wall   motion abnormalities. Doppler parameters are consistent with a   reversible restrictive pattern, indicative of decreased left   ventricular diastolic compliance and/or increased left atrial   pressure (grade 3 diastolic dysfunction). Doppler parameters are   consistent with high ventricular filling pressure. - Aortic valve: Transvalvular velocity was within the normal range.   There was no stenosis. - Mitral valve: Transvalvular velocity was within  the normal range.   There was no evidence for stenosis. There was mild regurgitation. - Right ventricle: The cavity size was mildly dilated. Wall   thickness was normal. Systolic function was normal. - Atrial septum: No defect or patent foramen ovale was identified   by color flow Doppler. - Tricuspid valve: There was mild regurgitation. - Pulmonary arteries: Systolic pressure was moderately to severely   increased. PA peak pressure: 57 mm Hg (S). - Pericardium, extracardiac: A trivial pericardial effusion was   identified.   10/10/15: lexiscan stress myoview There was no ST segment deviation noted during stress. No T wave inversion was noted during stress. The study is normal. This is a low risk study.  Low risk stress nuclear study with normal perfusion. Non gated study.  Recent Labs: 07/04/2022: B Natriuretic Peptide 109.2; TSH 1.342 07/11/2022: ALT 15; BUN 10; Creatinine, Ser 1.26; Hemoglobin 16.4; Magnesium 1.9; Platelets 178; Potassium 4.2; Sodium 139  No results found for requested labs within last 365 days.   Estimated Creatinine Clearance: 55 mL/min (A) (by C-G formula based on SCr of 1.26 mg/dL (H)).   Wt Readings from Last 3 Encounters:  07/06/22 210 lb 1.6 oz (95.3 kg)  07/02/22 210 lb (95.3 kg)  06/16/22 211 lb 3.2 oz (95.8 kg)     Other studies reviewed: Additional studies/records reviewed today include: summarized above  ASSESSMENT AND PLAN:  Paroxysmal AFib CHA2DS2Vasc is 5, on Eliquis, appropriately dosed Tikosyn Since the hydroxyzine did not  seem to help, I have asked him to stop it QTc is borderline but acceptable  He remains very happy with his AFib burden, I am not as convinced, but for now, will continue Last labs look ok   HTN Looks OK, home readings 90's-130's, will reduce his amlodipine hopefully swelling will improve  Edema Waxes/wanes, no SOB  Disposition: unable to get a spot for him in a week for an OV but will have him come for EKG and  RN visit next week, and clinic in a couple weeks again.    Current medicines are reviewed at length with the patient today.  The patient did not have any concerns regarding medicines.  Venetia Night, PA-C 07/15/2022 6:24 PM     [address] [place] [address] [phone] (office)  [phone] (fax)

## 2022-07-16 ENCOUNTER — Ambulatory Visit: Payer: Medicare PPO | Admitting: Physician Assistant

## 2022-07-16 ENCOUNTER — Other Ambulatory Visit (HOSPITAL_COMMUNITY): Payer: Self-pay | Admitting: Physician Assistant

## 2022-07-16 ENCOUNTER — Encounter: Payer: Self-pay | Admitting: Physician Assistant

## 2022-07-16 VITALS — BP 116/78 | HR 106 | Ht 68.0 in | Wt 211.0 lb

## 2022-07-16 DIAGNOSIS — Z79899 Other long term (current) drug therapy: Secondary | ICD-10-CM | POA: Diagnosis not present

## 2022-07-16 DIAGNOSIS — I484 Atypical atrial flutter: Secondary | ICD-10-CM | POA: Diagnosis not present

## 2022-07-16 DIAGNOSIS — I480 Paroxysmal atrial fibrillation: Secondary | ICD-10-CM | POA: Diagnosis not present

## 2022-07-16 DIAGNOSIS — I10 Essential (primary) hypertension: Secondary | ICD-10-CM | POA: Diagnosis not present

## 2022-07-16 DIAGNOSIS — Z5181 Encounter for therapeutic drug level monitoring: Secondary | ICD-10-CM | POA: Diagnosis not present

## 2022-07-16 NOTE — Patient Instructions (Addendum)
Medication Instructions:  Your physician has recommended you make the following change in your medication:   STOP TAKING:  HYDROXYZINE 25 MG,  DISCONTINUE THIS MEDICATION TODAY.    Lab Work: None ordered.  If you have labs (blood work) drawn today and your tests are completely normal, you will receive your results only by: [place] (if you have MyChart) OR A paper copy in the mail If you have any lab test that is abnormal or we need to change your treatment, we will call you to review the results.  Testing/Procedures: None ordered.  Follow-Up:  IN 1-2 MONTHS WITH DR. Viona Gilmore. [facility] AT [place].     Important Information About Sugar

## 2022-07-19 ENCOUNTER — Telehealth: Payer: Self-pay | Admitting: Physician Assistant

## 2022-07-19 NOTE — Telephone Encounter (Signed)
Pt c/o medication issue:  1. Name of Medication: Amlodipine  2. How are you currently taking this medication (dosage and times per day)?   3. Are you having a reaction (difficulty breathing--STAT)?   4. What is your medication issue? Chills, pain and swelling in his legs and shortness of breath- wants to stop taking the Amlodipine

## 2022-07-19 NOTE — Telephone Encounter (Signed)
Called patient and asked as looking through chart if Patrick Roth just started Amlodipine at his last visit on 8/25, patient stated no my kidney doctor ordered it. Advised the patient to call his kidney doctor. Verbalized understanding.

## 2022-07-21 DIAGNOSIS — D126 Benign neoplasm of colon, unspecified: Secondary | ICD-10-CM | POA: Diagnosis not present

## 2022-07-21 DIAGNOSIS — R1084 Generalized abdominal pain: Secondary | ICD-10-CM | POA: Diagnosis not present

## 2022-07-21 DIAGNOSIS — K5909 Other constipation: Secondary | ICD-10-CM | POA: Diagnosis not present

## 2022-07-21 DIAGNOSIS — R9389 Abnormal findings on diagnostic imaging of other specified body structures: Secondary | ICD-10-CM | POA: Diagnosis not present

## 2022-07-21 DIAGNOSIS — K648 Other hemorrhoids: Secondary | ICD-10-CM | POA: Diagnosis not present

## 2022-07-21 DIAGNOSIS — G8929 Other chronic pain: Secondary | ICD-10-CM | POA: Diagnosis not present

## 2022-07-21 DIAGNOSIS — I482 Chronic atrial fibrillation, unspecified: Secondary | ICD-10-CM | POA: Diagnosis not present

## 2022-08-04 ENCOUNTER — Other Ambulatory Visit: Payer: Self-pay | Admitting: Cardiology

## 2022-08-04 DIAGNOSIS — I4819 Other persistent atrial fibrillation: Secondary | ICD-10-CM

## 2022-08-04 DIAGNOSIS — E782 Mixed hyperlipidemia: Secondary | ICD-10-CM

## 2022-08-04 DIAGNOSIS — I700 Atherosclerosis of aorta: Secondary | ICD-10-CM

## 2022-08-05 NOTE — Telephone Encounter (Signed)
Prescription refill request for Eliquis received. Indication:Afib  Last office visit:07/16/22 Charlcie Cradle)  Scr: 1.26 (07/11/22) Age: 76 Weight: 95.7kg  Appropriate dose and refill sent to requested pharmacy.

## 2022-08-06 DIAGNOSIS — M5412 Radiculopathy, cervical region: Secondary | ICD-10-CM | POA: Diagnosis not present

## 2022-08-06 DIAGNOSIS — M25511 Pain in right shoulder: Secondary | ICD-10-CM | POA: Diagnosis not present

## 2022-08-06 DIAGNOSIS — M5416 Radiculopathy, lumbar region: Secondary | ICD-10-CM | POA: Diagnosis not present

## 2022-08-06 DIAGNOSIS — M5136 Other intervertebral disc degeneration, lumbar region: Secondary | ICD-10-CM | POA: Diagnosis not present

## 2022-08-06 DIAGNOSIS — M47816 Spondylosis without myelopathy or radiculopathy, lumbar region: Secondary | ICD-10-CM | POA: Diagnosis not present

## 2022-08-06 DIAGNOSIS — G8929 Other chronic pain: Secondary | ICD-10-CM | POA: Diagnosis not present

## 2022-08-10 ENCOUNTER — Ambulatory Visit: Payer: Medicare PPO | Attending: Physician Assistant

## 2022-08-10 DIAGNOSIS — Z79899 Other long term (current) drug therapy: Secondary | ICD-10-CM

## 2022-08-10 LAB — BASIC METABOLIC PANEL
BUN/Creatinine Ratio: 10 (ref 10–24)
BUN: 12 mg/dL (ref 8–27)
CO2: 24 mmol/L (ref 20–29)
Calcium: 8.9 mg/dL (ref 8.6–10.2)
Chloride: 102 mmol/L (ref 96–106)
Creatinine, Ser: 1.26 mg/dL (ref 0.76–1.27)
Glucose: 102 mg/dL — ABNORMAL HIGH (ref 70–99)
Potassium: 4 mmol/L (ref 3.5–5.2)
Sodium: 139 mmol/L (ref 134–144)
eGFR: 59 mL/min/{1.73_m2} — ABNORMAL LOW (ref >59)

## 2022-08-12 ENCOUNTER — Ambulatory Visit: Payer: Medicare HMO | Admitting: Neurology

## 2022-08-16 ENCOUNTER — Encounter: Payer: Self-pay | Admitting: *Deleted

## 2022-08-18 ENCOUNTER — Encounter: Payer: Self-pay | Admitting: Physician Assistant

## 2022-08-18 ENCOUNTER — Ambulatory Visit (INDEPENDENT_AMBULATORY_CARE_PROVIDER_SITE_OTHER): Payer: Medicare PPO | Admitting: Physician Assistant

## 2022-08-18 VITALS — BP 136/84 | HR 94 | Temp 97.7°F | Ht 68.0 in | Wt 210.8 lb

## 2022-08-18 DIAGNOSIS — I10 Essential (primary) hypertension: Secondary | ICD-10-CM

## 2022-08-18 DIAGNOSIS — I4891 Unspecified atrial fibrillation: Secondary | ICD-10-CM

## 2022-08-18 DIAGNOSIS — M5136 Other intervertebral disc degeneration, lumbar region: Secondary | ICD-10-CM | POA: Diagnosis not present

## 2022-08-18 DIAGNOSIS — Z23 Encounter for immunization: Secondary | ICD-10-CM | POA: Diagnosis not present

## 2022-08-18 NOTE — Progress Notes (Unsigned)
Subjective:    Patient ID: Patrick Roth, male    DOB: 24-Mar-1946, 76 y.o.   MRN: [id number]  Chief Complaint  Patient presents with   Follow-up    Pt in for 6 mon f/u; and med check; pt states thing have been a lot better over last three weeks; pt doesn't have any concerns to discuss at this time;     HPI Patient is in today for 6 month f/up.   Says that Lyrica 50 mg TID has really made a huge difference in his life. Back hasn't been bothering him. Nerve issues are greatly improved. Sleeping much better. Neuropathy in feet, worse on R than left, doing better as well.  A-fib is well-controlled, flares a little with his bowel movements, but resolves quickly. Says he didn't want any further intervention for his heart at this time.   Past Medical History:  Diagnosis Date   Atrial fibrillation/flutter    Intol of amio, beta blocker // Echo 12/22: inf HK, EF 50-55, normal RVSF, mild LaE, mild MR, mod MAC, AV sclerosis w/o aS   Colon polyps    Constipation 05/27/2015   DOE (dyspnea on exertion) 10/13/2015   Faintness 06/20/2016   History of chicken pox    Hyperlipidemia    Hypertension    Internal hemorrhoids    Polycythemia    Sciatica    Stroke ([place])    Hx of stroke in2008; recurrent CVA in 12/2020   Syncope and collapse 06/28/2016    Past Surgical History:  Procedure Laterality Date   CARDIOVERSION N/A 09/02/2017   Procedure: CARDIOVERSION;  Surgeon: [name], MD;  Location: [facility] ENDOSCOPY;  Service: Cardiovascular;  Laterality: N/A;   CARDIOVERSION N/A 07/06/2022   Procedure: CARDIOVERSION;  Surgeon: Freada Bergeron, MD;  Location: [facility] ENDOSCOPY;  Service: Cardiovascular;  Laterality: N/A;   COLONOSCOPY  2011   great toe surgery     PROSTATE BIOPSY      Family History  Problem Relation Age of Onset   Hypertension Father 67       Deceased   Heart disease Father    Lung cancer Mother 89       Deceased   Healthy Sister        x3   Healthy Brother         x4   Heart disease Brother        #5   Other Daughter        Alpha Thalassemia   Lupus Daughter        #2-deceased   Diabetes Neg Hx    Heart attack Neg Hx    Hyperlipidemia Neg Hx    Sudden death Neg Hx     Social History   Tobacco Use   Smoking status: Never   Smokeless tobacco: Never   Tobacco comments:    Never smoke 03/11/22  Vaping Use   Vaping Use: Never used  Substance Use Topics   Alcohol use: No   Drug use: No     Allergies  Allergen Reactions   Crestor [Rosuvastatin Calcium] Shortness Of Breath    Muscle cramps   Spironolactone Shortness Of Breath   Terazosin Shortness Of Breath, Palpitations and Other (See Comments)    Nerve pain, also   Amiodarone Other (See Comments)    Dropped pulse real low to 45-50, and made him feel very cold   Atenolol Other (See Comments)    Drowsiness and "flu sxs"   Bystolic [Nebivolol Hcl]  Other (See Comments)    GI issues   Gabapentin Other (See Comments)    "Did not help with pain"   Hydralazine Other (See Comments)    Joint swelling   Tizanidine Other (See Comments)    Syncope, Elevated BP/pulse   Clonidine Other (See Comments)    Unknown reaction   Hydrocodone-Acetaminophen Other (See Comments)    Unknown reaction   Levofloxacin Other (See Comments)    Unknown reaction   Losartan Other (See Comments)    Did not work.  Pt states he couldn't take b/c of SE, but can't remember what SE were.   Maxzide [Hydrochlorothiazide W-Triamterene] Other (See Comments)    Does not tolerate potassium-sparing diuretics   Olmesartan Other (See Comments)    Unknown reaction   Tussionex Pennkinetic Er [Hydrocod Poli-Chlorphe Poli Er] Other (See Comments)    Unknown reaction    Review of Systems NEGATIVE UNLESS OTHERWISE INDICATED IN HPI      Objective:     BP 136/84 (BP Location: Right Arm)   Pulse 94   Temp 97.7 F (36.5 C) (Temporal)   Ht _0  (1.727 m)   Wt 210 lb 12.8 oz (95.6 kg)   SpO2 99%   BMI 32.05 kg/m    Wt Readings from Last 3 Encounters:  08/18/22 210 lb 12.8 oz (95.6 kg)  07/16/22 211 lb (95.7 kg)  07/06/22 210 lb 1.6 oz (95.3 kg)    BP Readings from Last 3 Encounters:  08/18/22 136/84  07/16/22 116/78  07/11/22 (!) 150/94     Physical Exam Vitals and nursing note reviewed.  Constitutional:      General: He is not in acute distress.    Appearance: Normal appearance. He is not toxic-appearing.  HENT:     Head: Normocephalic and atraumatic.     Right Ear: Ear canal and external ear normal.     Left Ear: Ear canal and external ear normal.     Nose: Nose normal.     Mouth/Throat:     Mouth: Mucous membranes are moist.     Pharynx: Oropharynx is clear.  Eyes:     Extraocular Movements: Extraocular movements intact.     Conjunctiva/sclera: Conjunctivae normal.     Pupils: Pupils are equal, round, and reactive to light.  Cardiovascular:     Rate and Rhythm: Normal rate. Rhythm irregular.     Pulses: Normal pulses.     Heart sounds: Normal heart sounds.  Pulmonary:     Effort: Pulmonary effort is normal.     Breath sounds: Normal breath sounds.  Musculoskeletal:     Cervical back: Normal range of motion and neck supple.  Skin:    General: Skin is warm and dry.  Neurological:     General: No focal deficit present.     Mental Status: He is alert and oriented to person, place, and time.     Cranial Nerves: No cranial nerve deficit.     Motor: No weakness.  Psychiatric:        Mood and Affect: Mood normal.        Behavior: Behavior normal.        Assessment & Plan:  Atrial fibrillation with RVR [facility]) Assessment & Plan: Patient continues to follow with cardiology.  He is feeling the best he has in quite some time.  He continues to take Tikosyn 250 mcg twice daily.  Lopressor 25 mg as needed.  Patient is also on Eliquis 5 mg twice daily.  CHA2DS2-VASc Score = 6     Essential hypertension Assessment & Plan: Blood pressure is stable and well-controlled.  He  continues to take Norvasc 10 mg daily.   Other intervertebral disc degeneration, lumbar region Assessment & Plan: Follows with Dr. Arrie Eastern. Doing well with Lyrica 50 mg TID. Also continues Percocet 5-325 mg TID. Glad to see him feeling better today.   Need for immunization against influenza -     Flu Vaccine QUAD High Dose(Fluad)      Return in about 6 months (around 02/16/2023) for recheck, regular follow-up.  This note was prepared with assistance of Systems analyst. Occasional wrong-word or sound-a-like substitutions may have occurred due to the inherent limitations of voice recognition software.    Nikolaos Maddocks M Renelda Kilian, PA-C

## 2022-08-19 NOTE — Assessment & Plan Note (Addendum)
Patient continues to follow with cardiology.  He is feeling the best he has in quite some time.  He continues to take Tikosyn 250 mcg twice daily.  Lopressor 25 mg as needed.  Patient is also on Eliquis 5 mg twice daily.  CHA2DS2-VASc Score = 6

## 2022-08-19 NOTE — Assessment & Plan Note (Signed)
Blood pressure is stable and well-controlled.  He continues to take Norvasc 10 mg daily.

## 2022-08-19 NOTE — Assessment & Plan Note (Signed)
Follows with Dr. Arrie Eastern. Doing well with Lyrica 50 mg TID. Also continues Percocet 5-325 mg TID. Glad to see him feeling better today.

## 2022-08-24 ENCOUNTER — Ambulatory Visit: Payer: Medicare HMO | Admitting: Neurology

## 2022-09-01 ENCOUNTER — Ambulatory Visit: Payer: Medicare PPO | Admitting: Cardiology

## 2022-09-01 NOTE — Progress Notes (Deleted)
Electrophysiology Office Note   Date:  09/01/2022   ID:  Patrick Roth, DOB 10-08-1946, MRN [id number]  PCP:  [name], PA-C  Cardiologist:  [name] Primary Electrophysiologist:  Kathren Scearce Meredith Leeds, MD    No chief complaint on file.     History of Present Illness: Patrick Roth is a 76 y.o. male who presents today for electrophysiology evaluation.     He has a history significant hypertension, hyperlipidemia, CVA, atrial fibrillation, atrial flutter.  He was admitted in 2017 with syncope felt due to orthostasis.  He was found to be in atrial fibrillation with rapid rates at the time.  He was started metoprolol and Xarelto.  He had previously been on amiodarone but this was stopped.  He has been loaded on dofetilide.  Today, denies symptoms of palpitations, chest pain, shortness of breath, orthopnea, PND, lower extremity edema, claudication, dizziness, presyncope, syncope, bleeding, or neurologic sequela. The patient is tolerating medications without difficulties. ***    Past Medical History:  Diagnosis Date   Atrial fibrillation/flutter    Intol of amio, beta blocker // Echo 12/22: inf HK, EF 50-55, normal RVSF, mild LaE, mild MR, mod MAC, AV sclerosis w/o aS   Colon polyps    Constipation 05/27/2015   DOE (dyspnea on exertion) 10/13/2015   Faintness 06/20/2016   History of chicken pox    Hyperlipidemia    Hypertension    Internal hemorrhoids    Polycythemia    Sciatica    Stroke ([place])    Hx of stroke in2008; recurrent CVA in 12/2020   Syncope and collapse 06/28/2016   Past Surgical History:  Procedure Laterality Date   CARDIOVERSION N/A 09/02/2017   Procedure: CARDIOVERSION;  Surgeon: [name], MD;  Location: [place];  Service: Cardiovascular;  Laterality: N/A;   CARDIOVERSION N/A 07/06/2022   Procedure: CARDIOVERSION;  Surgeon: Freada Bergeron, MD;  Location: [facility] ENDOSCOPY;  Service: Cardiovascular;  Laterality: N/A;   COLONOSCOPY  2011    great toe surgery     PROSTATE BIOPSY       Current Outpatient Medications  Medication Sig Dispense Refill   amLODipine (NORVASC) 10 MG tablet Take 10 mg by mouth daily.     apixaban (ELIQUIS) 5 MG TABS tablet TAKE 1 TABLET TWICE DAILY 180 tablet 1   dofetilide (TIKOSYN) 250 MCG capsule Take 1 capsule by mouth twice daily 180 capsule 1   furosemide (LASIX) 20 MG tablet Take 1 tablet (20 mg total) by mouth daily. 45 tablet 1   metoprolol tartrate (LOPRESSOR) 25 MG tablet Take 25 mg by mouth as needed (if Heart right goes above 125).     oxyCODONE-acetaminophen (PERCOCET/ROXICET) 5-325 MG tablet Take 1 tablet by mouth every 8 (eight) hours as needed for moderate pain.     polyethylene glycol powder (GLYCOLAX/MIRALAX) 17 GM/SCOOP powder Take 17 g by mouth in the morning and at bedtime. Start with 1 scoop 2 times per day until bowels are moving, then reduce to 1 scoop daily. Drink 64oz of water daily. (Patient taking differently: Take 17 g by mouth daily as needed for mild constipation.) 3350 g 1   potassium chloride SA (KLOR-CON M) 20 MEQ tablet TAKE 1 TABLET BY MOUTH IN THE MORNING AND TAKE 2 TABLETS IN THE EVENING 270 tablet 0   pregabalin (LYRICA) 50 MG capsule Take 50 mg by mouth 3 (three) times daily.     REPATHA SURECLICK 427 MG/ML SOAJ INJECT 1 PEN INTO THE SKIN EVERY  14 DAYS. 6 mL 3   Vitamin D, Ergocalciferol, (DRISDOL) 1.25 MG (50000 UNIT) CAPS capsule Take 1 capsule (50,000 Units total) by mouth every 7 (seven) days. (Patient not taking: Reported on 08/18/2022) 12 capsule 0   No current facility-administered medications for this visit.    Allergies:   Crestor [rosuvastatin calcium], Spironolactone, Terazosin, Amiodarone, Atenolol, Bystolic [nebivolol hcl], Gabapentin, Hydralazine, Tizanidine, Clonidine, Hydrocodone-acetaminophen, Levofloxacin, Losartan, Maxzide [hydrochlorothiazide w-triamterene], Olmesartan, and Tussionex pennkinetic er [hydrocod poli-chlorphe poli er]   Social  History:  The patient  reports that he has never smoked. He has never used smokeless tobacco. He reports that he does not drink alcohol and does not use drugs.   Family History:  The patient's family history includes Healthy in his brother and sister; Heart disease in his brother and father; Hypertension (age of onset: 2) in his father; Lung cancer (age of onset: 44) in his mother; Lupus in his daughter; Other in his daughter.   ROS:  Please see the history of present illness.   Otherwise, review of systems is positive for none.   All other systems are reviewed and negative.   PHYSICAL EXAM: VS:  There were no vitals taken for this visit. , BMI There is no height or weight on file to calculate BMI. GEN: Well nourished, well developed, in no acute distress  HEENT: normal  Neck: no JVD, carotid bruits, or masses Cardiac: ***RRR; no murmurs, rubs, or gallops,no edema  Respiratory:  clear to auscultation bilaterally, normal work of breathing GI: soft, nontender, nondistended, + BS MS: no deformity or atrophy  Skin: warm and dry Neuro:  Strength and sensation are intact Psych: euthymic mood, full affect  EKG:  EKG {ACTION; IS/IS [id number]} ordered today. Personal review of the ekg ordered *** shows ***   Recent Labs: 07/04/2022: B Natriuretic Peptide 109.2; TSH 1.342 07/11/2022: ALT 15; Hemoglobin 16.4; Magnesium 1.9; Platelets 178 08/10/2022: BUN 12; Creatinine, Ser 1.26; Potassium 4.0; Sodium 139    Lipid Panel     Component Value Date/Time   CHOL 154 04/13/2021 0933   TRIG 87 04/13/2021 0933   HDL 55 04/13/2021 0933   CHOLHDL 2.8 04/13/2021 0933   CHOLHDL 4.6 01/17/2021 0036   VLDL 11 01/17/2021 0036   LDLCALC 83 04/13/2021 0933     Wt Readings from Last 3 Encounters:  08/18/22 210 lb 12.8 oz (95.6 kg)  07/16/22 211 lb (95.7 kg)  07/06/22 210 lb 1.6 oz (95.3 kg)      Other studies Reviewed: Additional studies/ records that were reviewed today include: TTE  02/28/18 Review of the above records today demonstrates:  - Left ventricle: The cavity size was normal. Systolic function was   normal. The estimated ejection fraction was in the range of 55%   to 60%. Wall motion was normal; there were no regional wall   motion abnormalities. There was a reduced contribution of atrial   contraction to ventricular filling, due to increased ventricular   diastolic pressure or atrial contractile dysfunction. The study   is not technically sufficient to allow evaluation of LV diastolic   function. (suspect left atrial mechanical failure, possible   stunning after recent episode of atrial fibrillation versus   chronic atrial dysfunction) - Left atrium: The atrium was mildly dilated. - Right ventricle: The cavity size was mildly dilated. Wall   thickness was normal. - Right atrium: The atrium was mildly dilated. - Pulmonary arteries: Systolic pressure was moderately increased.   PA peak pressure: 60 mm Hg (  S).  Myoview 02/24/18 Nuclear stress EF: 58%. Normal perfusion. No ischemia This is a low risk study.  ASSESSMENT AND PLAN:  1.  Persistent atrial fibrillation/atrial flutter: Currently on Eliquis 5 mg twice daily.  CHA2DS2-VASc of 4.  Also on dofetilide 250 mcg twice daily.  High risk medication monitoring for dofetilide.***  2.  Hypertension:***  3.  Secondary hypercoagulable state: Currently on Eliquis for atrial fibrillation as above. Current medicines are reviewed at length with the patient today.   The patient does not have concerns regarding his medicines.  The following changes were made today: ***  Labs/ tests ordered today include:  No orders of the defined types were placed in this encounter.     Disposition:   FU with Katilynn Sinkler *** months  Signed, Marieanne Marxen Meredith Leeds, MD  09/01/2022 2:21 PM     [facility] [place] [id number] [phone] (office) [phone] (fax)

## 2022-09-07 ENCOUNTER — Encounter (HOSPITAL_BASED_OUTPATIENT_CLINIC_OR_DEPARTMENT_OTHER): Payer: Self-pay | Admitting: Emergency Medicine

## 2022-09-07 ENCOUNTER — Other Ambulatory Visit: Payer: Self-pay

## 2022-09-07 ENCOUNTER — Emergency Department (HOSPITAL_BASED_OUTPATIENT_CLINIC_OR_DEPARTMENT_OTHER)
Admission: EM | Admit: 2022-09-07 | Discharge: 2022-09-07 | Disposition: A | Discharge status: Home / Self Care | Payer: Medicare PPO | Attending: Emergency Medicine | Admitting: Emergency Medicine

## 2022-09-07 ENCOUNTER — Telehealth: Payer: Self-pay | Admitting: Physician Assistant

## 2022-09-07 ENCOUNTER — Telehealth (HOSPITAL_COMMUNITY): Payer: Self-pay

## 2022-09-07 ENCOUNTER — Telehealth: Payer: Self-pay | Admitting: Pharmacist

## 2022-09-07 ENCOUNTER — Emergency Department (HOSPITAL_BASED_OUTPATIENT_CLINIC_OR_DEPARTMENT_OTHER): Payer: Medicare PPO | Admitting: Radiology

## 2022-09-07 DIAGNOSIS — Z7901 Long term (current) use of anticoagulants: Secondary | ICD-10-CM | POA: Diagnosis not present

## 2022-09-07 DIAGNOSIS — Z20822 Contact with and (suspected) exposure to covid-19: Secondary | ICD-10-CM | POA: Insufficient documentation

## 2022-09-07 DIAGNOSIS — R000 Tachycardia, unspecified: Secondary | ICD-10-CM | POA: Diagnosis not present

## 2022-09-07 DIAGNOSIS — I480 Paroxysmal atrial fibrillation: Secondary | ICD-10-CM | POA: Insufficient documentation

## 2022-09-07 LAB — CBC
HCT: 52.2 % — ABNORMAL HIGH (ref 39.0–52.0)
Hemoglobin: 17 g/dL (ref 13.0–17.0)
MCH: 28.7 pg (ref 26.0–34.0)
MCHC: 32.6 g/dL (ref 30.0–36.0)
MCV: 88.2 fL (ref 80.0–100.0)
Platelets: 168 10*3/uL (ref 150–400)
RBC: 5.92 MIL/uL — ABNORMAL HIGH (ref 4.22–5.81)
RDW: 14 % (ref 11.5–15.5)
WBC: 3.5 10*3/uL — ABNORMAL LOW (ref 4.0–10.5)
nRBC: 0 % (ref 0.0–0.2)

## 2022-09-07 LAB — BASIC METABOLIC PANEL
Anion gap: 12 (ref 5–15)
BUN: 10 mg/dL (ref 8–23)
CO2: 27 mmol/L (ref 22–32)
Calcium: 10.1 mg/dL (ref 8.9–10.3)
Chloride: 103 mmol/L (ref 98–111)
Creatinine, Ser: 1.23 mg/dL (ref 0.61–1.24)
GFR, Estimated: >60 mL/min (ref >60)
Glucose, Bld: 120 mg/dL — ABNORMAL HIGH (ref 70–99)
Potassium: 4 mmol/L (ref 3.5–5.1)
Sodium: 142 mmol/L (ref 135–145)

## 2022-09-07 LAB — RESP PANEL BY RT-PCR (FLU A&B, COVID) ARPGX2
Influenza A by PCR: NEGATIVE
Influenza B by PCR: NEGATIVE
SARS Coronavirus 2 by RT PCR: NEGATIVE

## 2022-09-07 LAB — BRAIN NATRIURETIC PEPTIDE: B Natriuretic Peptide: 231.3 pg/mL — ABNORMAL HIGH (ref 0.0–100.0)

## 2022-09-07 LAB — MAGNESIUM: Magnesium: 2 mg/dL (ref 1.7–2.4)

## 2022-09-07 LAB — TROPONIN I (HIGH SENSITIVITY): Troponin I (High Sensitivity): 14 ng/L (ref <18)

## 2022-09-07 MED ORDER — DOFETILIDE 250 MCG PO CAPS: 250.0000 ug | ORAL_CAPSULE | Freq: Two times a day (BID) | ORAL | Status: DC

## 2022-09-07 MED ORDER — DILTIAZEM HCL 25 MG/5ML IV SOLN
10.0000 mg | Freq: Once | INTRAVENOUS | Status: AC
  Administered 2022-09-07: 10 mg via INTRAVENOUS
  Filled 2022-09-07: qty 5

## 2022-09-07 MED ORDER — APIXABAN 2.5 MG PO TABS
5.0000 mg | ORAL_TABLET | Freq: Two times a day (BID) | ORAL | Status: DC
  Administered 2022-09-07: 5 mg via ORAL
  Filled 2022-09-07: qty 2

## 2022-09-07 MED ORDER — DILTIAZEM HCL 25 MG/5ML IV SOLN: 20.0000 mg | Freq: Once | INTRAVENOUS | Status: DC

## 2022-09-07 MED ORDER — METOPROLOL TARTRATE 25 MG PO TABS
25.0000 mg | ORAL_TABLET | ORAL | Status: DC | PRN
  Administered 2022-09-07: 25 mg via ORAL
  Filled 2022-09-07: qty 1

## 2022-09-07 NOTE — Chronic Care Management (AMB) (Signed)
Is he taking any sort of bowel regimen on a regular basis?  If not he needs to be taking a stimulant laxative (Senna) plus a stool softener (Colace) on a regular basis.  He could find Senna Plus which may have both in there.  This should help his constipation.  Beverly Milch, PharmD Clinical Pharmacist  [facility] [phone]

## 2022-09-07 NOTE — Telephone Encounter (Signed)
Patient Name: Patrick Roth Gender: Male DOB: 1945-11-23 Age: 76 Y 7 M 2 D Return Phone Number: [phone] (Primary), [phone] (Secondary) Address: City/ State/ Zip: [place]  [address] Client [place] [facility] Client Site [place] [facility] [place] [facility] Paediatric nurse, Freight forwarder- PA Contact Type Call Who Is Calling Patient / Member / Family / Caregiver Call Type Triage / Clinical Relationship To Patient Self Return Phone Number [phone] (Primary) Chief Complaint Heart palpitations or irregular heartbeat Reason for Call Symptomatic / Request for [place] stated that they have a PT that was seen in ER for a fib and stomach pain [place] Not Listed [place] Translation No Nurse Assessment Nurse: Kirk Ruths, Therapist, sports, [facility] Date/Time (Eastern Time): 09/07/2022 1:59:47 PM Confirm and document reason for call. If symptomatic, describe symptoms. ---Caller states he was seen in the ER for afib and stomach pain states he is still having stomach pain that is worse than it was when he was seen continues to be in afib thinks the stomach pain is causing the stomach pain 7/8 pain scale for the stomach no n/v/d drinking fluids and feels hydrated non medicated was taking miralax but was having diarrhea fever ER did nothing for his stomach Does the patient have any new or worsening symptoms? ---Yes Will a triage be completed? ---Yes Related visit to physician within the last 2 weeks? ---No Does the PT have any chronic conditions? (i.e. diabetes, asthma, this includes High risk factors for pregnancy, etc.) ---Yes List chronic conditions. ---htn Is this a behavioral health or substance abuse call? ---No Guidelines Guideline Title Affirmed Question Affirmed Notes Nurse Date/Time Eilene Ghazi Time) Abdominal Pain - Upper [1] SEVERE pain (e.g., excruciating) Kirk Ruths, RN, [facility] 09/07/2022  2:03:47 PM  Guidelines Guideline Title Affirmed Question Affirmed Notes Nurse Date/Time Eilene Ghazi Time) AND [2] present > 1 hour Disp. Time Eilene Ghazi Time) Disposition Final User 09/07/2022 2:06:16 PM Go to ED Now Yes Kirk Ruths, RN, [facility] Final Disposition 09/07/2022 2:06:16 PM Go to ED Now Yes Kirk Ruths, RN, Liana Gerold Disagree/Comply Comply Caller Understands Yes PreDisposition Go to ED Care Advice Given Per Guideline GO TO ED NOW: * You need to be seen in the Emergency Department. * Go to the ED at ___________ [place] now. Drive carefully. NOTE TO TRIAGER - DRIVING: * Another adult should drive. * Patient should not delay going to the emergency department. * If immediate transportation is not available via car, rideshare (e.g., Lyft, Uber), or taxi, then the patient should be instructed to call [id number]. NOTHING BY MOUTH: * Do not eat or drink anything for now. * Reason: Condition may need surgery and general anesthesia. CARE ADVICE given per Abdominal Pain - Upper (Adult) guideline. Referrals GO TO FACILITY OTHER - SPECIFY

## 2022-09-07 NOTE — Discharge Instructions (Addendum)
Initially presented in atrial fibrillation with rapid ventricular response but this resolved with a single dose of IV diltiazem and then your oral home metoprolol.  Recommend you take your home Tikosyn as soon as you get home today. Your home Eliquis has been given this morning.  Your laboratory evaluation has been reassuring.  Your abdomen is soft, nontender, nondistended.  Given your concerns with your bowel movements, recommend to schedule a follow-up appointment with gastroenterology.

## 2022-09-07 NOTE — Telephone Encounter (Signed)
Pt states: -recently released from ED -ED was able to get heart rate stable. -now experiencing intense stomach pain.   Pt referred to triage nurse by PCP team.   Awaiting follow up notes.

## 2022-09-07 NOTE — Telephone Encounter (Signed)
Reached out to patient to schedule ED f/u. He stated that he has an appointment with EP and I informed him I didn't see anything and that if he couldn't get schedule to reach back out.

## 2022-09-07 NOTE — Progress Notes (Signed)
Chronic Care Management Pharmacy Assistant   Name: Patrick Roth  MRN: [id number] DOB: Jan 26, 1946   Reason for Encounter: Hypertension Adherence Call    Recent office visits:  08/18/2022 [facility] (PCP) Allwardt, Alyssa M, PA-C; no medication changes noted.  Recent consult visits:  08/06/2022 [facility] (Phys Med) Tristan Schroeder, DO; no medication changes indicated.  07/16/2022 [facility] (Cardiology) Baldwin Jamaica, PA-C;  Since the hydroxyzine did not seem to help, I have asked him to stop it  Hospital visits:  09/07/2022 ED visit for Paroxysmal atrial fibrillation  07/11/2022 ED visit for Hypertension -No medication changes noted  07/04/2022 ED to Hospital admission due to Afib with RVR -Cardioversion on 07/06/2022 continue tikosyn and eliquis, and his PRN lopressor   Medications: Facility-Administered Encounter Medications as of 09/07/2022  Medication   apixaban (ELIQUIS) tablet 5 mg   metoprolol tartrate (LOPRESSOR) tablet 25 mg   Outpatient Encounter Medications as of 09/07/2022  Medication Sig   amLODipine (NORVASC) 10 MG tablet Take 10 mg by mouth daily.   apixaban (ELIQUIS) 5 MG TABS tablet TAKE 1 TABLET TWICE DAILY   dofetilide (TIKOSYN) 250 MCG capsule Take 1 capsule by mouth twice daily   furosemide (LASIX) 20 MG tablet Take 1 tablet (20 mg total) by mouth daily.   metoprolol tartrate (LOPRESSOR) 25 MG tablet Take 25 mg by mouth as needed (if Heart right goes above 125).   oxyCODONE-acetaminophen (PERCOCET/ROXICET) 5-325 MG tablet Take 1 tablet by mouth every 8 (eight) hours as needed for moderate pain.   polyethylene glycol powder (GLYCOLAX/MIRALAX) 17 GM/SCOOP powder Take 17 g by mouth in the morning and at bedtime. Start with 1 scoop 2 times per day until bowels are moving, then reduce to 1 scoop daily. Drink 64oz of water daily. (Patient taking differently: Take 17 g by mouth daily as needed for mild constipation.)   potassium chloride SA (KLOR-CON M) 20 MEQ tablet TAKE 1  TABLET BY MOUTH IN THE MORNING AND TAKE 2 TABLETS IN THE EVENING   pregabalin (LYRICA) 50 MG capsule Take 50 mg by mouth 3 (three) times daily.   REPATHA SURECLICK 329 MG/ML SOAJ INJECT 1 PEN INTO THE SKIN EVERY 14 DAYS.   Vitamin D, Ergocalciferol, (DRISDOL) 1.25 MG (50000 UNIT) CAPS capsule Take 1 capsule (50,000 Units total) by mouth every 7 (seven) days. (Patient not taking: Reported on 08/18/2022)   Reviewed chart prior to disease state call. Spoke with patient regarding BP  Recent Office Vitals: BP Readings from Last 3 Encounters:  09/07/22 (!) 140/102  08/18/22 136/84  07/16/22 116/78   Pulse Readings from Last 3 Encounters:  09/07/22 82  08/18/22 94  07/16/22 (!) 106    Wt Readings from Last 3 Encounters:  09/07/22 213 lb 13.5 oz (97 kg)  08/18/22 210 lb 12.8 oz (95.6 kg)  07/16/22 211 lb (95.7 kg)     Kidney Function Lab Results  Component Value Date/Time   CREATININE 1.23 09/07/2022 07:30 AM   CREATININE 1.26 08/10/2022 10:44 AM   CREATININE 1.27 (H) 05/15/2020 09:54 AM   CREATININE 1.37 (H) 02/13/2020 09:24 AM   CREATININE 1.20 (H) 10/25/2016 11:40 AM   CREATININE 1.36 (H) 09/14/2016 10:19 AM   GFR 67.92 02/15/2022 11:47 AM   GFRNONAA >60 09/07/2022 07:30 AM   GFRNONAA 55 (L) 05/15/2020 09:54 AM   GFRAA 58 (L) 01/07/2021 10:01 AM   GFRAA >60 05/15/2020 09:54 AM       Latest Ref Rng & Units 09/07/2022  7:30 AM 08/10/2022   10:44 AM 07/11/2022   12:28 PM  BMP  Glucose 70 - 99 mg/dL 120  102  107   BUN 8 - 23 mg/dL '10  12  10   '$ Creatinine 0.61 - 1.24 mg/dL 1.23  1.26  1.26   BUN/Creat Ratio 10 - 24  10    Sodium 135 - 145 mmol/L 142  139  139   Potassium 3.5 - 5.1 mmol/L 4.0  4.0  4.2   Chloride 98 - 111 mmol/L 103  102  105   CO2 22 - 32 mmol/L '27  24  26   '$ Calcium 8.9 - 10.3 mg/dL 10.1  8.9  9.3     Current antihypertensive regimen:  Amlodipine 10 mg daily Furosemide 20 mg daily Metoprolol Tartrate 25 mg as needed (patient did take one dose this  morning)  How often are you checking your Blood Pressure? daily  Current home BP readings: 161/94 37 HR  What recent interventions/DTPs have been made by any provider to improve Blood Pressure control since last CPP Visit: No recent interventions or DTPs.  Any recent hospitalizations or ED visits since last visit with CPP? Yes  What diet changes have been made to improve Blood Pressure Control?  No recent diet changes, patient states he is unable to eat large amounts of food at a time or it will "bother his stomach."  What exercise is being done to improve your Blood Pressure Control?  None at this time.  Adherence Review: Is the patient currently on ACE/ARB medication? No Does the patient have >5 day gap between last estimated fill dates? No  -Patient states he went to the ER this morning and plans to go back because he is not feeling well. He states he is waiting on his wife to take him back to the ER.  -Patient states he is having some issues with constipation. He states when he tries to have a bowel movement it will cause his blood pressure and heart rate to rise.  Care Gaps: Medicare Annual Wellness: Completed 10/12/2021 Hemoglobin A1C: 5.8% on 01/17/2021 Colonoscopy: Completed 11/04/2020  Future Appointments  Date Time Provider [place]  09/28/2022  3:30 PM [facility] CCM PHARMACIST [facility]  10/25/2022  1:45 PM [facility] HEALTH COACH [facility]  11/10/2022  9:00 AM Bo Merino, MD [facility] None  02/16/2023  9:00 AM Allwardt, [name], PA-C [facility]   [facility]: None  April D Calhoun, [place] Pharmacist Assistant [phone]

## 2022-09-07 NOTE — ED Provider Notes (Signed)
[place] EMERGENCY DEPT Provider Note   CSN: [id number] Arrival date & time: 09/07/22  6967     History {Add pertinent medical, surgical, social history, OB history to HPI:1} Chief Complaint  Patient presents with   Tachycardia    Patrick Roth is a 76 y.o. male.  HPI     Home Medications Prior to Admission medications   Medication Sig Start Date End Date Taking? Authorizing Provider  amLODipine (NORVASC) 10 MG tablet Take 10 mg by mouth daily.    [provider]  apixaban (ELIQUIS) 5 MG TABS tablet TAKE 1 TABLET TWICE DAILY 08/05/22   [name], Wonda Cheng, MD  dofetilide [facility]) 250 MCG capsule Take 1 capsule by mouth twice daily 07/16/22   Camnitz, Ocie Doyne, MD  furosemide (LASIX) 20 MG tablet Take 1 tablet (20 mg total) by mouth daily. 06/16/22   Baldwin Jamaica, PA-C  metoprolol tartrate (LOPRESSOR) 25 MG tablet Take 25 mg by mouth as needed (if Heart right goes above 125).    [provider]  oxyCODONE-acetaminophen (PERCOCET/ROXICET) 5-325 MG tablet Take 1 tablet by mouth every 8 (eight) hours as needed for moderate pain. 03/18/22   [provider]  polyethylene glycol powder (GLYCOLAX/MIRALAX) 17 GM/SCOOP powder Take 17 g by mouth in the morning and at bedtime. Start with 1 scoop 2 times per day until bowels are moving, then reduce to 1 scoop daily. Drink 64oz of water daily. Patient taking differently: Take 17 g by mouth daily as needed for mild constipation. 09/18/21   Jeanie Sewer, NP  potassium chloride SA (KLOR-CON M) 20 MEQ tablet TAKE 1 TABLET BY MOUTH IN THE MORNING AND TAKE 2 TABLETS IN THE EVENING 05/19/22   Allwardt, Alyssa M, PA-C  pregabalin (LYRICA) 50 MG capsule Take 50 mg by mouth 3 (three) times daily. 04/20/22   [provider]  REPATHA SURECLICK 893 MG/ML SOAJ INJECT 1 PEN INTO THE SKIN EVERY 14 DAYS. 08/05/22   Camnitz, Ocie Doyne, MD  Vitamin D, Ergocalciferol, (DRISDOL) 1.25 MG (50000 UNIT) CAPS  capsule Take 1 capsule (50,000 Units total) by mouth every 7 (seven) days. Patient not taking: Reported on 08/18/2022 05/10/22   Allwardt, [name], PA-C      Allergies    Crestor [rosuvastatin calcium], Spironolactone, Terazosin, Amiodarone, Atenolol, Bystolic [nebivolol hcl], Gabapentin, Hydralazine, Tizanidine, Clonidine, Hydrocodone-acetaminophen, Levofloxacin, Losartan, Maxzide [hydrochlorothiazide w-triamterene], Olmesartan, and Tussionex pennkinetic er [hydrocod poli-chlorphe poli er]    Review of Systems   Review of Systems  Physical Exam Updated Vital Signs BP (!) 142/105 (BP Location: Right Arm)   Pulse (!) 147   Temp 97.8 F (36.6 C) (Oral)   Resp 18   Wt 97 kg   SpO2 96%   BMI 32.52 kg/m  Physical Exam  ED Results / Procedures / Treatments   Labs (all labs ordered are listed, but only abnormal results are displayed) Labs Reviewed  CBC - Abnormal; Notable for the following components:      Result Value   WBC 3.5 (*)    RBC 5.92 (*)    HCT 52.2 (*)    All other components within normal limits  BASIC METABOLIC PANEL  BRAIN NATRIURETIC PEPTIDE  TROPONIN I (HIGH SENSITIVITY)    EKG None  Radiology No results found.  Procedures Procedures  {Document cardiac monitor, telemetry assessment procedure when appropriate:1}  Medications Ordered in ED Medications  diltiazem (CARDIZEM) injection 20 mg (has no administration in time range)    ED Course/ Medical Decision  Making/ A&P                           Medical Decision Making Amount and/or Complexity of Data Reviewed Labs: ordered. Radiology: ordered.  Risk Prescription drug management.   ***  {Document critical care time when appropriate:1} {Document review of labs and clinical decision tools ie heart score, Chads2Vasc2 etc:1}  {Document your independent review of radiology images, and any outside records:1} {Document your discussion with family members, caretakers, and with  consultants:1} {Document social determinants of health affecting pt's care:1} {Document your decision making why or why not admission, treatments were needed:1} Final Clinical Impression(s) / ED Diagnoses Final diagnoses:  None    Rx / DC Orders ED Discharge Orders     None

## 2022-09-07 NOTE — ED Triage Notes (Signed)
Pt with a h/o afib rvr presents for racing heart, rate 180s noted at home. Tried taking an extra dose of metoprolol 2 hrs ago without improvement. Endorses nausea. Denies dizziness. Takes eliquis.

## 2022-09-07 NOTE — ED Notes (Signed)
Called lab to add [place], spoke with Kathlee Nations.

## 2022-09-08 NOTE — Telephone Encounter (Signed)
Please see triage notes

## 2022-09-10 ENCOUNTER — Telehealth: Payer: Self-pay | Admitting: Gastroenterology

## 2022-09-10 NOTE — Telephone Encounter (Signed)
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 When I was involved in his care during a recent hospitalization, he was seeing the doctors at [facility].

## 2022-09-10 NOTE — Telephone Encounter (Signed)
My practice is sufficiently mature that I am not able to accept patients with established GI health care elsewhere.  Sorry, but thanks for checking

## 2022-09-10 NOTE — Telephone Encounter (Signed)
Good morning Dr Tarri Glenn  Supervising MD 10/17 AM   We have received a referral for patient. Patient state he has been having abdominal issues. Patient was lat seen 03/15/22 with [facility] but patient is requesting a transfer due to wanting a second opinion.  Records are available for review in epics please review and advise on scheduling.  Than you

## 2022-09-15 ENCOUNTER — Encounter (HOSPITAL_BASED_OUTPATIENT_CLINIC_OR_DEPARTMENT_OTHER): Payer: Self-pay

## 2022-09-15 ENCOUNTER — Other Ambulatory Visit: Payer: Self-pay

## 2022-09-15 ENCOUNTER — Emergency Department (HOSPITAL_BASED_OUTPATIENT_CLINIC_OR_DEPARTMENT_OTHER)
Admission: EM | Admit: 2022-09-15 | Discharge: 2022-09-15 | Disposition: A | Discharge status: Home / Self Care | Payer: Medicare PPO | Attending: Emergency Medicine | Admitting: Emergency Medicine

## 2022-09-15 DIAGNOSIS — Z7901 Long term (current) use of anticoagulants: Secondary | ICD-10-CM | POA: Insufficient documentation

## 2022-09-15 DIAGNOSIS — Z79899 Other long term (current) drug therapy: Secondary | ICD-10-CM | POA: Diagnosis not present

## 2022-09-15 DIAGNOSIS — R109 Unspecified abdominal pain: Secondary | ICD-10-CM | POA: Insufficient documentation

## 2022-09-15 DIAGNOSIS — R1030 Lower abdominal pain, unspecified: Secondary | ICD-10-CM | POA: Diagnosis not present

## 2022-09-15 LAB — COMPREHENSIVE METABOLIC PANEL
ALT: 12 U/L (ref 0–44)
AST: 21 U/L (ref 15–41)
Albumin: 4.4 g/dL (ref 3.5–5.0)
Alkaline Phosphatase: 77 U/L (ref 38–126)
Anion gap: 11 (ref 5–15)
BUN: 10 mg/dL (ref 8–23)
CO2: 24 mmol/L (ref 22–32)
Calcium: 9.3 mg/dL (ref 8.9–10.3)
Chloride: 104 mmol/L (ref 98–111)
Creatinine, Ser: 1.26 mg/dL — ABNORMAL HIGH (ref 0.61–1.24)
GFR, Estimated: 59 mL/min — ABNORMAL LOW (ref >60)
Glucose, Bld: 118 mg/dL — ABNORMAL HIGH (ref 70–99)
Potassium: 3.8 mmol/L (ref 3.5–5.1)
Sodium: 139 mmol/L (ref 135–145)
Total Bilirubin: 1.7 mg/dL — ABNORMAL HIGH (ref 0.3–1.2)
Total Protein: 7.4 g/dL (ref 6.5–8.1)

## 2022-09-15 LAB — URINALYSIS, ROUTINE W REFLEX MICROSCOPIC
Bilirubin Urine: NEGATIVE
Glucose, UA: NEGATIVE mg/dL
Hgb urine dipstick: NEGATIVE
Ketones, ur: NEGATIVE mg/dL
Leukocytes,Ua: NEGATIVE
Nitrite: NEGATIVE
Specific Gravity, Urine: 1.022 (ref 1.005–1.030)
pH: 5.5 (ref 5.0–8.0)

## 2022-09-15 LAB — CBC
HCT: 50.9 % (ref 39.0–52.0)
Hemoglobin: 16.3 g/dL (ref 13.0–17.0)
MCH: 28.4 pg (ref 26.0–34.0)
MCHC: 32 g/dL (ref 30.0–36.0)
MCV: 88.8 fL (ref 80.0–100.0)
Platelets: 161 10*3/uL (ref 150–400)
RBC: 5.73 MIL/uL (ref 4.22–5.81)
RDW: 14.1 % (ref 11.5–15.5)
WBC: 4.9 10*3/uL (ref 4.0–10.5)
nRBC: 0 % (ref 0.0–0.2)

## 2022-09-15 LAB — LIPASE, BLOOD: Lipase: 16 U/L (ref 11–51)

## 2022-09-15 NOTE — ED Triage Notes (Signed)
Patient here POV from Home.  Endorses Generalized ABD Pain that has been present for approximately 1 Year.  No N/V/D. No Fevers. No Dysuria.   NAD Noted during Triage. A&Ox4. GCS 15. Ambulatory.

## 2022-09-15 NOTE — ED Provider Notes (Signed)
[place] EMERGENCY DEPT Provider Note   CSN: [id number] Arrival date & time: 09/15/22  1743     History {Add pertinent medical, surgical, social history, OB history to HPI:1} Chief Complaint  Patient presents with   Abdominal Pain    Patrick Roth is a 76 y.o. male.  HPI 76 year old male presents with abdominal pain. It's been on and off for about 1 year. Recurred again yesterday. Pain is primarily when having a BM. After the [facility] he feels better. Gets "internal pain" and pain near his rectum. Was also causing on and off palpitations, which was concerning to him due to his afib history. Currently is asymptomatic. Has been seen at [facility], but he states all they do is talk to him. He has not had a colonoscopy. Is trying to see other GI doctors but can't see [place] until December or January.   Home Medications Prior to Admission medications   Medication Sig Start Date End Date Taking? Authorizing Provider  amLODipine (NORVASC) 10 MG tablet Take 10 mg by mouth daily.    [provider]  apixaban (ELIQUIS) 5 MG TABS tablet TAKE 1 TABLET TWICE DAILY 08/05/22   [name], Wonda Cheng, MD  dofetilide [facility]) 250 MCG capsule Take 1 capsule by mouth twice daily 07/16/22   Camnitz, Ocie Doyne, MD  furosemide (LASIX) 20 MG tablet Take 1 tablet (20 mg total) by mouth daily. 06/16/22   Baldwin Jamaica, PA-C  metoprolol tartrate (LOPRESSOR) 25 MG tablet Take 25 mg by mouth as needed (if Heart right goes above 125).    [provider]  oxyCODONE-acetaminophen (PERCOCET/ROXICET) 5-325 MG tablet Take 1 tablet by mouth every 8 (eight) hours as needed for moderate pain. 03/18/22   [provider]  polyethylene glycol powder (GLYCOLAX/MIRALAX) 17 GM/SCOOP powder Take 17 g by mouth in the morning and at bedtime. Start with 1 scoop 2 times per day until bowels are moving, then reduce to 1 scoop daily. Drink 64oz of water daily. Patient taking differently:  Take 17 g by mouth daily as needed for mild constipation. 09/18/21   Jeanie Sewer, NP  potassium chloride SA (KLOR-CON M) 20 MEQ tablet TAKE 1 TABLET BY MOUTH IN THE MORNING AND TAKE 2 TABLETS IN THE EVENING 05/19/22   Allwardt, Alyssa M, PA-C  pregabalin (LYRICA) 50 MG capsule Take 50 mg by mouth 3 (three) times daily. 04/20/22   [provider]  REPATHA SURECLICK 425 MG/ML SOAJ INJECT 1 PEN INTO THE SKIN EVERY 14 DAYS. 08/05/22   Camnitz, Ocie Doyne, MD  Vitamin D, Ergocalciferol, (DRISDOL) 1.25 MG (50000 UNIT) CAPS capsule Take 1 capsule (50,000 Units total) by mouth every 7 (seven) days. Patient not taking: Reported on 08/18/2022 05/10/22   Allwardt, [name], PA-C      Allergies    Crestor [rosuvastatin calcium], Spironolactone, Terazosin, Amiodarone, Atenolol, Bystolic [nebivolol hcl], Gabapentin, Hydralazine, Tizanidine, Clonidine, Hydrocodone-acetaminophen, Levofloxacin, Losartan, Maxzide [hydrochlorothiazide w-triamterene], Olmesartan, and Tussionex pennkinetic er [hydrocod poli-chlorphe poli er]    Review of Systems   Review of Systems  Cardiovascular:  Positive for palpitations.  Gastrointestinal:  Positive for abdominal pain. Negative for constipation and diarrhea.    Physical Exam Updated Vital Signs BP (!) 169/102   Pulse 85   Temp 98 F (36.7 C) (Oral)   Resp 18   Ht '5\' 8"'$  (1.727 m)   Wt 97 kg   SpO2 99%   BMI 32.52 kg/m  Physical Exam Vitals and nursing note reviewed.  Constitutional:      Appearance: He is well-developed.  HENT:     Head: Normocephalic and atraumatic.  Cardiovascular:     Rate and Rhythm: Normal rate and regular rhythm.     Heart sounds: Normal heart sounds.  Pulmonary:     Effort: Pulmonary effort is normal.     Breath sounds: Normal breath sounds.  Abdominal:     Palpations: Abdomen is soft.     Tenderness: There is no abdominal tenderness.  Genitourinary:    Rectum: No mass, tenderness, anal fissure, external hemorrhoid or  internal hemorrhoid. Normal anal tone.     Comments: No gross blood on rectal exam.  No fecal impaction or tenderness Skin:    General: Skin is warm and dry.  Neurological:     Mental Status: He is alert.     ED Results / Procedures / Treatments   Labs (all labs ordered are listed, but only abnormal results are displayed) Labs Reviewed  COMPREHENSIVE METABOLIC PANEL - Abnormal; Notable for the following components:      Result Value   Glucose, Bld 118 (*)    Creatinine, Ser 1.26 (*)    Total Bilirubin 1.7 (*)    GFR, Estimated 59 (*)    All other components within normal limits  URINALYSIS, ROUTINE W REFLEX MICROSCOPIC - Abnormal; Notable for the following components:   Protein, ur TRACE (*)    All other components within normal limits  LIPASE, BLOOD  CBC    EKG None  Radiology No results found.  Procedures Procedures  {Document cardiac monitor, telemetry assessment procedure when appropriate:1}  Medications Ordered in ED Medications - No data to display  ED Course/ Medical Decision Making/ A&P                           Medical Decision Making Amount and/or Complexity of Data Reviewed Labs: ordered.   ***  {Document critical care time when appropriate:1} {Document review of labs and clinical decision tools ie heart score, Chads2Vasc2 etc:1}  {Document your independent review of radiology images, and any outside records:1} {Document your discussion with family members, caretakers, and with consultants:1} {Document social determinants of health affecting pt's care:1} {Document your decision making why or why not admission, treatments were needed:1} Final Clinical Impression(s) / ED Diagnoses Final diagnoses:  Abdominal pain, unspecified abdominal location    Rx / DC Orders ED Discharge Orders     None

## 2022-09-15 NOTE — Discharge Instructions (Signed)
If you develop worsening, continued, or recurrent abdominal pain, uncontrolled vomiting, fever, chest or back pain, or any other new/concerning symptoms then return to the ER for evaluation.  

## 2022-09-18 ENCOUNTER — Emergency Department (HOSPITAL_COMMUNITY)
Admission: EM | Admit: 2022-09-18 | Discharge: 2022-09-19 | Disposition: A | Discharge status: Hospice | Payer: Medicare PPO | Attending: Emergency Medicine | Admitting: Emergency Medicine

## 2022-09-18 ENCOUNTER — Encounter (HOSPITAL_COMMUNITY): Payer: Self-pay

## 2022-09-18 ENCOUNTER — Other Ambulatory Visit: Payer: Self-pay

## 2022-09-18 DIAGNOSIS — I10 Essential (primary) hypertension: Secondary | ICD-10-CM | POA: Insufficient documentation

## 2022-09-18 DIAGNOSIS — Z79899 Other long term (current) drug therapy: Secondary | ICD-10-CM | POA: Diagnosis not present

## 2022-09-18 DIAGNOSIS — R1030 Lower abdominal pain, unspecified: Secondary | ICD-10-CM | POA: Insufficient documentation

## 2022-09-18 DIAGNOSIS — D72819 Decreased white blood cell count, unspecified: Secondary | ICD-10-CM | POA: Insufficient documentation

## 2022-09-18 DIAGNOSIS — K644 Residual hemorrhoidal skin tags: Secondary | ICD-10-CM | POA: Diagnosis not present

## 2022-09-18 DIAGNOSIS — Z7901 Long term (current) use of anticoagulants: Secondary | ICD-10-CM | POA: Insufficient documentation

## 2022-09-18 LAB — URINALYSIS, ROUTINE W REFLEX MICROSCOPIC
Bacteria, UA: NONE SEEN
Bilirubin Urine: NEGATIVE
Glucose, UA: NEGATIVE mg/dL
Ketones, ur: NEGATIVE mg/dL
Leukocytes,Ua: NEGATIVE
Nitrite: NEGATIVE
Protein, ur: NEGATIVE mg/dL
Specific Gravity, Urine: 1.01 (ref 1.005–1.030)
pH: 5 (ref 5.0–8.0)

## 2022-09-18 LAB — CBC
HCT: 51.2 % (ref 39.0–52.0)
Hemoglobin: 16.4 g/dL (ref 13.0–17.0)
MCH: 28.4 pg (ref 26.0–34.0)
MCHC: 32 g/dL (ref 30.0–36.0)
MCV: 88.7 fL (ref 80.0–100.0)
Platelets: 161 10*3/uL (ref 150–400)
RBC: 5.77 MIL/uL (ref 4.22–5.81)
RDW: 13.9 % (ref 11.5–15.5)
WBC: 3.3 10*3/uL — ABNORMAL LOW (ref 4.0–10.5)
nRBC: 0 % (ref 0.0–0.2)

## 2022-09-18 LAB — COMPREHENSIVE METABOLIC PANEL
ALT: 18 U/L (ref 0–44)
AST: 26 U/L (ref 15–41)
Albumin: 3.9 g/dL (ref 3.5–5.0)
Alkaline Phosphatase: 77 U/L (ref 38–126)
Anion gap: 10 (ref 5–15)
BUN: 12 mg/dL (ref 8–23)
CO2: 24 mmol/L (ref 22–32)
Calcium: 9.1 mg/dL (ref 8.9–10.3)
Chloride: 105 mmol/L (ref 98–111)
Creatinine, Ser: 1.25 mg/dL — ABNORMAL HIGH (ref 0.61–1.24)
GFR, Estimated: 59 mL/min — ABNORMAL LOW (ref >60)
Glucose, Bld: 101 mg/dL — ABNORMAL HIGH (ref 70–99)
Potassium: 4.1 mmol/L (ref 3.5–5.1)
Sodium: 139 mmol/L (ref 135–145)
Total Bilirubin: 1.6 mg/dL — ABNORMAL HIGH (ref 0.3–1.2)
Total Protein: 7.4 g/dL (ref 6.5–8.1)

## 2022-09-18 LAB — TROPONIN I (HIGH SENSITIVITY)
Troponin I (High Sensitivity): 15 ng/L (ref <18)
Troponin I (High Sensitivity): 16 ng/L (ref <18)

## 2022-09-18 LAB — LIPASE, BLOOD: Lipase: 29 U/L (ref 11–51)

## 2022-09-18 NOTE — ED Provider Triage Note (Cosign Needed Addendum)
Emergency Medicine Provider Triage Evaluation Note  Patrick Roth , a 76 y.o. male  was evaluated in triage.  Pt complains of ;problem in colon causing a fib. Went to [facility] (09/15/22), had a physical exam and pressing on abdomen made symptoms better.  Scheduled to see GI in Feb,  States HR 185 this morning, took Metoprolol as directed by his dr.  Review of Systems  Positive: As above Negative: As above  Physical Exam  BP (!) 141/107   Pulse 98   Temp 98.8 F (37.1 C) (Oral)   Resp 16   SpO2 99%  Gen:   Awake, no distress   Resp:  Normal effort  MSK:   Moves extremities without difficulty  Other:    Medical Decision Making  Medically screening exam initiated at 12:06 PM.  Appropriate orders placed.  Patrick Roth was informed that the remainder of the evaluation will be completed by another provider, this initial triage assessment does not replace that evaluation, and the importance of remaining in the ED until their evaluation is complete.  Imaging on file reviewed- has had 3 Cts of area in 04/14/22, 07/07/22, 07/11/22   Tacy Learn, PA-C 09/18/22 1208    Tacy Learn, PA-C 09/18/22 1209

## 2022-09-18 NOTE — ED Triage Notes (Signed)
Patient complains of lower abdominal pain since being seen for same at [facility] on 10/25. Patient states that it returned last night and noted BP elevated. Denies nausea, no vomiting, no constipation

## 2022-09-19 MED ORDER — METOPROLOL TARTRATE 25 MG PO TABS
25.0000 mg | ORAL_TABLET | Freq: Once | ORAL | Status: AC
  Administered 2022-09-19: 25 mg via ORAL
  Filled 2022-09-19: qty 1

## 2022-09-19 MED ORDER — AMLODIPINE BESYLATE 5 MG PO TABS
10.0000 mg | ORAL_TABLET | Freq: Once | ORAL | Status: AC
  Administered 2022-09-19: 10 mg via ORAL
  Filled 2022-09-19: qty 2

## 2022-09-19 NOTE — Discharge Instructions (Signed)
It was a pleasure taking care of you today.  As discussed, all of your labs were reassuring.  Your rectal exam did not show any abnormalities except external hemorrhoids.  I have included the number of the GI doctor on-call.  Call Monday to schedule an appointment for further evaluation.  You may take over-the-counter Tylenol as needed for pain.  Return to the ER for new or worsening symptoms.

## 2022-09-19 NOTE — ED Provider Notes (Signed)
[facility] DEPARTMENT Provider Note   CSN: [id number] Arrival date & time: 09/18/22  1057     History  No chief complaint on file.   Patrick Roth is a 76 y.o. male with a past medical history significant for A-fib on chronic Eliquis, history of rectal sphincter spasms, hypertension, hyperlipidemia, history of CVA who presents to the ED due to lower abdominal pain.  Patient states pain is chronic in nature.  He states pain occurs with bowel movements however, denies any straining.  Patient believes he has a foreign body in his "colon".  He states he had a CT scan in June 2023 that demonstrated a possible fishbone.  Patient is concerned that this foreign body is still present given his pain.  Patient states when he has a bowel movement  his heart rate and blood pressure increase however, denies any straining.  No bleeding.  Patient states he had a colonoscopy roughly 8 months ago which was normal with no abnormalities.  No fever or chills.  No urinary symptoms.  Last had a CT abdomen performed on 07/11/2022 which was negative for any acute abnormalities/foreign bodies.  Patient was seen at [facility] on 10/25 for the same complaint.  At that time a rectal exam was performed which was unremarkable. Patient states his pain improved after his rectal exam. No evidence of impaction.  Patient last had a bowel movement yesterday which was normal.  History obtained from patient and past medical records. No interpreter used during encounter.       Home Medications Prior to Admission medications   Medication Sig Start Date End Date Taking? Authorizing Provider  amLODipine (NORVASC) 10 MG tablet Take 10 mg by mouth daily.    [provider]  apixaban (ELIQUIS) 5 MG TABS tablet TAKE 1 TABLET TWICE DAILY 08/05/22   [name], Wonda Cheng, MD  dofetilide [facility]) 250 MCG capsule Take 1 capsule by mouth twice daily 07/16/22   Camnitz, Ocie Doyne, MD  furosemide (LASIX) 20  MG tablet Take 1 tablet (20 mg total) by mouth daily. 06/16/22   Baldwin Jamaica, PA-C  metoprolol tartrate (LOPRESSOR) 25 MG tablet Take 25 mg by mouth as needed (if Heart right goes above 125).    [provider]  oxyCODONE-acetaminophen (PERCOCET/ROXICET) 5-325 MG tablet Take 1 tablet by mouth every 8 (eight) hours as needed for moderate pain. 03/18/22   [provider]  polyethylene glycol powder (GLYCOLAX/MIRALAX) 17 GM/SCOOP powder Take 17 g by mouth in the morning and at bedtime. Start with 1 scoop 2 times per day until bowels are moving, then reduce to 1 scoop daily. Drink 64oz of water daily. Patient taking differently: Take 17 g by mouth daily as needed for mild constipation. 09/18/21   Jeanie Sewer, NP  potassium chloride SA (KLOR-CON M) 20 MEQ tablet TAKE 1 TABLET BY MOUTH IN THE MORNING AND TAKE 2 TABLETS IN THE EVENING 05/19/22   Allwardt, Alyssa M, PA-C  pregabalin (LYRICA) 50 MG capsule Take 50 mg by mouth 3 (three) times daily. 04/20/22   [provider]  REPATHA SURECLICK 595 MG/ML SOAJ INJECT 1 PEN INTO THE SKIN EVERY 14 DAYS. 08/05/22   Camnitz, Ocie Doyne, MD  Vitamin D, Ergocalciferol, (DRISDOL) 1.25 MG (50000 UNIT) CAPS capsule Take 1 capsule (50,000 Units total) by mouth every 7 (seven) days. Patient not taking: Reported on 08/18/2022 05/10/22   Allwardt, [name], PA-C      Allergies    Crestor Jaci Standard  calcium], Spironolactone, Terazosin, Amiodarone, Atenolol, Bystolic [nebivolol hcl], Gabapentin, Hydralazine, Tizanidine, Clonidine, Hydrocodone-acetaminophen, Levofloxacin, Losartan, Maxzide [hydrochlorothiazide w-triamterene], Olmesartan, and Tussionex pennkinetic er [hydrocod poli-chlorphe poli er]    Review of Systems   Review of Systems  Constitutional:  Negative for chills and fever.  Respiratory:  Negative for shortness of breath.   Cardiovascular:  Negative for chest pain.  Gastrointestinal:  Positive for abdominal pain. Negative  for constipation, diarrhea, nausea and vomiting.  Genitourinary:  Negative for dysuria.    Physical Exam Updated Vital Signs BP (!) 153/101   Pulse 98   Temp (!) 97.3 F (36.3 C) (Oral)   Resp 18   SpO2 100%  Physical Exam Vitals and nursing note reviewed.  Constitutional:      General: He is not in acute distress.    Appearance: He is not ill-appearing.  HENT:     Head: Normocephalic.  Eyes:     Pupils: Pupils are equal, round, and reactive to light.  Cardiovascular:     Rate and Rhythm: Normal rate and regular rhythm.     Pulses: Normal pulses.     Heart sounds: Normal heart sounds. No murmur heard.    No friction rub. No gallop.  Pulmonary:     Effort: Pulmonary effort is normal.     Breath sounds: Normal breath sounds.  Abdominal:     General: Abdomen is flat. There is no distension.     Palpations: Abdomen is soft.     Tenderness: There is no abdominal tenderness. There is no guarding or rebound.  Genitourinary:    Comments: Rectal exam performed with chaperone in room.  External hemorrhoids.  No evidence of thrombosed hemorrhoid. No impaction.  Musculoskeletal:        General: Normal range of motion.     Cervical back: Neck supple.  Skin:    General: Skin is warm and dry.  Neurological:     General: No focal deficit present.     Mental Status: He is alert.  Psychiatric:        Mood and Affect: Mood normal.        Behavior: Behavior normal.     ED Results / Procedures / Treatments   Labs (all labs ordered are listed, but only abnormal results are displayed) Labs Reviewed  COMPREHENSIVE METABOLIC PANEL - Abnormal; Notable for the following components:      Result Value   Glucose, Bld 101 (*)    Creatinine, Ser 1.25 (*)    Total Bilirubin 1.6 (*)    GFR, Estimated 59 (*)    All other components within normal limits  CBC - Abnormal; Notable for the following components:   WBC 3.3 (*)    All other components within normal limits  URINALYSIS, ROUTINE W  REFLEX MICROSCOPIC - Abnormal; Notable for the following components:   Hgb urine dipstick SMALL (*)    All other components within normal limits  LIPASE, BLOOD  TROPONIN I (HIGH SENSITIVITY)  TROPONIN I (HIGH SENSITIVITY)    EKG EKG Interpretation  Date/Time:  Saturday September 18 2022 12:02:39 EDT Ventricular Rate:  98 PR Interval:    QRS Duration: 152 QT Interval:  422 QTC Calculation: 538 R Axis:   -59 Text Interpretation: Sinus rhythm with first degree AV block Left axis deviation Right bundle branch block Abnormal ECG When compared with ECG of 15-Sep-2022 18:11, No significant change since last tracing Confirmed by Calvert Cantor [phone]) on 09/19/2022 8:20:57 AM  Radiology No results found.  Procedures  Procedures    Medications Ordered in ED Medications  amLODipine (NORVASC) tablet 10 mg (10 mg Oral Given 09/19/22 0832)  metoprolol tartrate (LOPRESSOR) tablet 25 mg (25 mg Oral Given 09/19/22 9622)    ED Course/ Medical Decision Making/ A&P                           Medical Decision Making Amount and/or Complexity of Data Reviewed External Data Reviewed: notes. Labs: ordered. Decision-making details documented in ED Course.  Risk Prescription drug management.   This patient presents to the ED for concern of abdominal pain, this involves an extensive number of treatment options, and is a complaint that carries with it a high risk of complications and morbidity.  The differential diagnosis includes colitis, bowel obstruction, hemorrhoids, foreign body, etc  76 year old male presents to the ED due to persistent lower abdominal pain.  Patient states whenever he has a bowel movement his heart rate and BP increases.  Denies straining. History of total sphincter spasms. patient was seen in the ED on 10/25 where a rectal exam was performed which patient states improved his pain for a few days however, it turned.  Patient has been seen numerous times for same complaint. He  has had numerous unremarkable CT scans. His main concern is that he had a CT scan in June 2023 that showed a possible foreign body; however CT scans after that have not shown it. Has a GI doctor.  Patient requesting referral to different GI doctor.  No bleeding.  Upon arrival, stable vitals.  Patient in no acute distress.  Unfortunately patient waited over 20 hours prior to my initial evaluation.  Abdomen soft, nondistended, nontender. Low suspicion for acute abdomen. Rectal exam performed with chaperone in room.  External hemorrhoids without evidence of thrombosis. No impaction.  CBC significant for leukopenia at 3.3.  Normal hemoglobin.  Lipase normal at 29.  CMP significant for elevated creatinine 1.25.  No major electrolyte. Elevated total bilirubin which has been elevated in the past.  No right upper quadrant tenderness.  Derangements.  UA negative for signs of infection.  Considered CT abdomen however, given benign exam and numerous previous CT scan did not feel emergent imaging needed at this time. Low suspicion for acute abdomen. Patient given GI referral. Home BP medications given. Strict ED precautions discussed with patient. Patient states understanding and agrees to plan. Patient discharged home in no acute distress and stable vitals        Final Clinical Impression(s) / ED Diagnoses Final diagnoses:  Lower abdominal pain    Rx / DC Orders ED Discharge Orders     None         Karie Kirks 09/19/22 [id number]    Truddie Hidden, MD 09/19/22 [phone]

## 2022-09-20 NOTE — Telephone Encounter (Signed)
Patient advised.

## 2022-09-21 DIAGNOSIS — I4891 Unspecified atrial fibrillation: Secondary | ICD-10-CM | POA: Diagnosis not present

## 2022-09-21 DIAGNOSIS — Z8673 Personal history of transient ischemic attack (TIA), and cerebral infarction without residual deficits: Secondary | ICD-10-CM | POA: Diagnosis not present

## 2022-09-21 DIAGNOSIS — R194 Change in bowel habit: Secondary | ICD-10-CM | POA: Diagnosis not present

## 2022-09-21 DIAGNOSIS — Z7901 Long term (current) use of anticoagulants: Secondary | ICD-10-CM | POA: Diagnosis not present

## 2022-09-21 DIAGNOSIS — I10 Essential (primary) hypertension: Secondary | ICD-10-CM | POA: Diagnosis not present

## 2022-09-21 DIAGNOSIS — K449 Diaphragmatic hernia without obstruction or gangrene: Secondary | ICD-10-CM | POA: Diagnosis not present

## 2022-09-22 NOTE — Progress Notes (Deleted)
Chronic Care Management Pharmacy Note  09/22/2022 Name:  Patrick Roth MRN:  [id number] DOB:  03/04/1946  Summary: FU with PharmD.  He has been having episodes of home BP severely elevated 893T systolic per his reports.  Have reviewed ER precautions.  These tend to coincide with bowel spasms that start in his legs and travel towards the anus.  Most of the time BP is controlled.  Have asked him to monitor at home and will FU in two weeks.  Subjective: Patrick Roth is an 76 y.o. year old male who is a primary patient of Allwardt, Alyssa M, PA-C.  The CCM team was consulted for assistance with disease management and care coordination needs.    Engaged with patient by telephone for follow up visit in response to provider referral for pharmacy case management and/or care coordination services.   Consent to Services:  The patient was given information about Chronic Care Management services, agreed to services, and gave verbal consent prior to initiation of services.  Please see initial visit note for detailed documentation.   Patient Care Team: Allwardt, [name], PA-C as PCP - General (Physician Assistant) Constance Haw, MD as PCP - Electrophysiology (Cardiology) [name], Wonda Cheng, MD as PCP - Cardiology (Cardiology) Irene Shipper, MD as Consulting Physician (Gastroenterology) [name], Wonda Cheng, MD as Consulting Physician (Cardiology) Christy Sartorius, MD as Referring Physician (Urology) Inocencio Homes, [place] as Consulting Physician (Podiatry) Myrle Sheng, MD as Referring Physician (Neurosurgery) [place], Geradine Girt, DO (Sports Medicine) Edythe Clarity, [facility] (Pharmacist)  Recent office visits:  11/19/2021 (A-fib Visit) Sherran Needs, NP; 1 week s/p Tikosyn   10/19/2021 OV (PCP) Allwardt, [name], PA-C; -His chronic constipation 2/2 opioids does seem to be causing him grief and we talked about Linzess as a possibility. As he is established with GI, I would like to run  this by them and get back to the patient right away. I have sent a phone message out to his office today   09/18/2021 VV Jeanie Sewer, NP; generic Miralax to start bid, then qd.   09/17/2021 VV (PCP) Allwardt, [name], PA-C; Ultimately decided against anti-viral therapy at this time as he is unsure when his symptoms exactly started and he is doing fairly well overall   Recent consult visits:  11/20/2021 OV (Orthopaedic, Sports Medicine, Spine) Begovich, Geradine Girt, DO; no medication changes indicated.   11/03/2021 OV (Cardiology) Baldwin Jamaica, PA-C; no medication changes indicated.   10/05/2021 OV (Cardiology) Baldwin Jamaica, PA-C; I have given him lopressor 35m BID PRN for HR >120/symptoms Discussed NOT to use if HR is not [facility]  [facility] ED visit due to chest discomfort.  Ultimately, everything was normal.  He was safe to go home and to follow up with cardiology.   Objective:    Latest Ref Rng & Units 09/18/2022   11:53 AM 09/15/2022    6:14 PM 09/07/2022    7:30 AM  BMP  Glucose 70 - 99 mg/dL 101  118  120   BUN 8 - 23 mg/dL _0 Creatinine 0.61 - 1.24 mg/dL 1.25  1.26  1.23   Sodium 135 - 145 mmol/L 139  139  142   Potassium 3.5 - 5.1 mmol/L 4.1  3.8  4.0   Chloride 98 - 111 mmol/L 105  104  103   CO2 22 - 32 mmol/L 24  24  27  Calcium 8.9 - 10.3 mg/dL 9.1  9.3  10.1    Lab Results  Component Value Date   CREATININE 1.25 (H) 09/18/2022   CREATININE 1.26 (H) 09/15/2022   CREATININE 1.23 09/07/2022    Lab Results  Component Value Date   HGBA1C 5.8 (H) 01/17/2021   Last diabetic Eye exam: No results found for: "HMDIABEYEEXA"  Last diabetic Foot exam: No results found for: "HMDIABFOOTEX"      Component Value Date/Time   CHOL 154 04/13/2021 0933   TRIG 87 04/13/2021 0933   HDL 55 04/13/2021 0933   CHOLHDL 2.8 04/13/2021 0933   CHOLHDL 4.6 01/17/2021 0036   VLDL 11 01/17/2021 0036   LDLCALC 83 04/13/2021 0933        Latest Ref Rng & Units 09/18/2022   11:53 AM 09/15/2022    6:14 PM 07/11/2022   12:28 PM  Hepatic Function  Total Protein 6.5 - 8.1 g/dL 7.4  7.4  7.2   Albumin 3.5 - 5.0 g/dL 3.9  4.4  4.1   AST 15 - 41 U/L _0 ALT 0 - 44 U/L _1 Alk Phosphatase 38 - 126 U/L 77  77  84   Total Bilirubin 0.3 - 1.2 mg/dL 1.6  1.7  1.3     Lab Results  Component Value Date/Time   TSH 1.342 07/04/2022 03:15 PM   TSH 1.470 05/03/2022 11:20 AM   TSH 1.89 02/15/2022 11:47 AM   FREET4 0.67 02/15/2022 11:47 AM       Latest Ref Rng & Units 09/18/2022   11:53 AM 09/15/2022    6:14 PM 09/07/2022    7:30 AM  CBC  WBC 4.0 - 10.5 K/uL 3.3  4.9  3.5   Hemoglobin 13.0 - 17.0 g/dL 16.4  16.3  17.0   Hematocrit 39.0 - 52.0 % 51.2  50.9  52.2   Platelets 150 - 400 K/uL 161  161  168    Lab Results  Component Value Date/Time   VD25OH 17.4 (L) 05/03/2022 11:20 AM    Clinical ASCVD: Yes  The ASCVD Risk score (Arnett DK, et al., 2019) failed to calculate for the following reasons:   The patient has a prior MI or stroke diagnosis    Social History   Tobacco Use  Smoking Status Never  Smokeless Tobacco Never  Tobacco Comments   Never smoke 03/11/22   BP Readings from Last 3 Encounters:  09/19/22 (!) 153/101  09/15/22 (!) 169/102  09/07/22 (!) 140/102   Pulse Readings from Last 3 Encounters:  09/19/22 98  09/15/22 85  09/07/22 82   Wt Readings from Last 3 Encounters:  09/15/22 213 lb 13.5 oz (97 kg)  09/07/22 213 lb 13.5 oz (97 kg)  08/18/22 210 lb 12.8 oz (95.6 kg)   Assessment: Review of patient past medical history, allergies, medications, health status, including review of consultants reports, laboratory and other test data, was performed as part of comprehensive evaluation and provision of chronic care management services.   SDOH:  (Social Determinants of Health) assessments and interventions performed: Yes. SDOH Interventions    Flowsheet Row Chronic Care Management  from 09/18/2020 in [place] Management from 03/20/2020 in [place]  SDOH Interventions    Food Insecurity Interventions Other (Comment) Other (Comment)  Transportation Interventions Other (Comment) Other (Comment)       CCM Care Plan  Allergies  Allergen  Reactions   Crestor [Rosuvastatin Calcium] Shortness Of Breath    Muscle cramps   Spironolactone Shortness Of Breath   Terazosin Shortness Of Breath, Palpitations and Other (See Comments)    Nerve pain, also   Amiodarone Other (See Comments)    Dropped pulse real low to 45-50, and made him feel very cold   Atenolol Other (See Comments)    Drowsiness and "flu sxs"   Bystolic [Nebivolol Hcl] Other (See Comments)    GI issues   Gabapentin Other (See Comments)    "Did not help with pain"   Hydralazine Other (See Comments)    Joint swelling   Tizanidine Other (See Comments)    Syncope, Elevated BP/pulse   Clonidine Other (See Comments)    Unknown reaction   Hydrocodone-Acetaminophen Other (See Comments)    Unknown reaction   Levofloxacin Other (See Comments)    Unknown reaction   Losartan Other (See Comments)    Did not work.  Pt states he couldn't take b/c of SE, but can't remember what SE were.   Maxzide [Hydrochlorothiazide W-Triamterene] Other (See Comments)    Does not tolerate potassium-sparing diuretics   Olmesartan Other (See Comments)    Unknown reaction   Tussionex Pennkinetic Er [Hydrocod Poli-Chlorphe Poli Er] Other (See Comments)    Unknown reaction    Medications Reviewed Today     Reviewed by Mercer Pod, RN (Registered Nurse) on 09/15/22 at [place] List Status: <None>   Medication Order Taking? Sig Documenting Provider Last Dose Status Informant  amLODipine (NORVASC) 10 MG tablet [id number]  Take 10 mg by mouth daily. [provider]  Active Self  apixaban (ELIQUIS) 5 MG TABS tablet [id number]   TAKE 1 TABLET TWICE DAILY [name], Wonda Cheng, MD  Active   dofetilide (TIKOSYN) 250 MCG capsule [id number]  Take 1 capsule by mouth twice daily Camnitz, Will Hassell Done, MD  Active   furosemide (LASIX) 20 MG tablet [id number]  Take 1 tablet (20 mg total) by mouth daily. Baldwin Jamaica, PA-C  Active Self  metoprolol tartrate (LOPRESSOR) 25 MG tablet [id number]  Take 25 mg by mouth as needed (if Heart right goes above 125). [provider]  Active Self  oxyCODONE-acetaminophen (PERCOCET/ROXICET) 5-325 MG tablet [id number]  Take 1 tablet by mouth every 8 (eight) hours as needed for moderate pain. [provider]  Active Self  polyethylene glycol powder (GLYCOLAX/MIRALAX) 17 GM/SCOOP powder [id number]  Take 17 g by mouth in the morning and at bedtime. Start with 1 scoop 2 times per day until bowels are moving, then reduce to 1 scoop daily. Drink 64oz of water daily.  Patient taking differently: Take 17 g by mouth daily as needed for mild constipation.   Jeanie Sewer, NP  Active Self           Med Note Ishmael Holter, APRIL S   Tue Nov 10, 2021 10:16 AM)    potassium chloride SA (KLOR-CON M) 20 MEQ tablet [id number]  TAKE 1 TABLET BY MOUTH IN THE MORNING AND TAKE 2 TABLETS IN THE EVENING Allwardt, Alyssa M, PA-C  Active Self  pregabalin (LYRICA) 50 MG capsule [id number]  Take 50 mg by mouth 3 (three) times daily. [provider]  Active Self  REPATHA SURECLICK 188 MG/ML SOAJ [id number]  INJECT 1 PEN INTO THE SKIN EVERY 14 DAYS. Camnitz, Will Hassell Done, MD  Active   Vitamin D, Ergocalciferol, (DRISDOL) 1.25 MG (50000 UNIT) CAPS capsule [id number]  Take 1 capsule (50,000 Units total) by mouth  every 7 (seven) days.  Patient not taking: Reported on 08/18/2022   Allwardt, [name], PA-C  Active Self            Patient Active Problem List   Diagnosis Date Noted   Atrial fibrillation with RVR ([place]) 07/04/2022   Afib ([place]) 11/10/2021   Dyssynergic defecation 11/01/2021   GERD  (gastroesophageal reflux disease) 11/01/2021   PAF (paroxysmal atrial fibrillation) ([place]) 10/31/2021   Atrial fibrillation with rapid ventricular response ([place])    Aortic atherosclerosis ([place]) 10/28/2021   Wide-complex tachycardia 10/28/2021   Rectal sphincter spasm 10/28/2021   Paroxysmal atrial fibrillation ([place]) 05/26/2021   Polycythemia 05/26/2021   Statin myopathy 01/29/2021   Acute arterial ischemic stroke, multifocal, anterior circulation, left ([place]) 01/16/2021   Secondary hypercoagulable state ([place]) 09/11/2020   Cyst of perineum in male 11/28/2018   Mixed hyperlipidemia    History of chicken pox    Colon polyps    Atrial flutter with rapid ventricular response ([place])    Other intervertebral disc degeneration, lumbar region 05/09/2017   Lumbar radiculopathy 03/29/2017   Persistent atrial fibrillation ([place]) 09/14/2016   Diuretic-induced hypokalemia 06/28/2016   Essential hypertension 05/27/2015   Constipation due to pain medication 05/27/2015   Mallet deformity of fourth finger, right 07/11/2013   Stroke ([place]) 08/23/2012    Immunization History  Administered Date(s) Administered   Fluad Quad(high Dose 65+) 08/18/2022   Influenza,inj,Quad PF,6+ Mos 07/23/2015   Influenza-Unspecified 07/23/2018   PFIZER(Purple Top)SARS-COV-2 Vaccination 12/24/2019, 01/21/2020, 09/06/2020   Pneumococcal Polysaccharide-23 11/13/2021   Tdap 07/05/2013    Conditions to be addressed/monitored: Atrial Fibrillation, HTN and HLD  There are no care plans that you recently modified to display for this patient.      Future Appointments  Date Time Provider [place]  09/28/2022  3:30 PM [facility] CCM PHARMACIST [facility]  10/25/2022  1:45 PM [facility] HEALTH COACH [facility]  11/10/2022  9:00 AM Bo Merino, MD [facility] None  02/16/2023  9:00 AM Allwardt, [name], PA-C [facility]    Beverly Milch, PharmD Clinical Pharmacist  [facility] [phone]           Current Barriers:  Fluctuating BP, bowel spasms  Pharmacist Clinical Goal(s):  Patient will verbalize ability to afford treatment regimen contact provider office for questions/concerns as evidenced notation of same in electronic health record through collaboration with PharmD and provider.   Interventions: 1:1 collaboration with Allwardt, [name], PA-C regarding development and update of comprehensive plan of care as evidenced by provider attestation and co-signature Inter-disciplinary care team collaboration (see longitudinal plan of care) Comprehensive medication review performed; medication list updated in electronic medical record No medication changes  Hypertension (BP goal <140/90) -Controlled -Current treatment: Amlodipine 10 mg daily Appropriate, Effective, Safe, Accessible -Denies issues with potassium tolerability, able to take 20 meq tablets 3x/day without a problem.   -Current home readings: when symptomatic  -Current dietary habits: no specific diet. 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 Admits to not doing the best with hydradtion. -Denies hypotensive/hypertensive symptoms -Educated on BP goals and benefits of medications for prevention of heart attack, stroke and kidney damage; -Counseled to monitor BP at home, document, and provide log at future appointments -Recommended to continue current medication Counseled on staying hydrated especially during times of intense physical activity  Update 09/07/21 Reports BP has been controlled, pulse has been "up and down." No specific logs at this time.  He denies any  symptoms such as dizziness or HA's. He is now taking his chlorthalidone in the morning and he tolerates well. Encouraged to maintain hydration and continue medications adherence.  He continues to be very active around his house.  Continue current meds at this time.  Update 03/23/22 He reports  having BP episodes that are very irregular.  Reports some systolic BP in the 027X where he is having episodes with what sounds like bowel spasms.  Other days his BP is completely normal.  BP has been normal in the office.  Saw GI and no clear explanation of why he is having these GI symptoms.  ER precautions discussed. He is going to monitor BP and record and we will call him in 2-3 weeks to check in.  No changes at this time.  Hyperlipidemia: (LDL goal < 70) -Not ideally controlled per 03/2021 labs  -Repatha start 01/2021 -Current treatment: Repatha 140 mg injection every 14 days (01/2021) -Medications previously tried: statin intolerant.  -Reports that everything is taken care of from a cost standpoint through lipid clinic. Will not be responsible for OOP spending due to approval of [employer] grant.  -Educated on Cholesterol goals;  -Recommended to continue current medication  Atrial Fibrillation (Goal: prevent stroke and major bleeding) -Controlled -Current treatment: Rate control: Metoprolol succinate 25 mg prn for HR > 120  Dofetilide 269mg Appropriate, Effective, Safe, Accessible Anticoagulation:  Eliquis 5 mg twice daily Appropriate, Effective, Safe, Accessible -Denies costs concerns with Eliquis, currently about $40 dollars/month. Not wanting to pursue patient assistance at this time -Home BP and HR readings: n/a  -Counseled on increased risk of stroke due to Afib and benefits of anticoagulation for stroke prevention; importance of adherence to anticoagulant exactly as prescribed; -Recommended to continue current medication, again emphasized adequate water intake.  Update 09/07/21 Does mention cost burden with Eliquis at this time.  He has not previously applied for patient assistance and is interested at this time in an application.  I discussed the OOP spend with him and the requirements for approval.  Will have application sent to his house for completion. Denies abnormal  bleeding/bruising.  He is requesting a new hematologist so will reach out to PCP for referral.  Continue current meds  Update 03/23/22 Recently started Tikosyn and doing well on it.  Reports he is very happy with how he feels since starting this medication.  Working on controlling BP.  HR appears to be controlled.  Denies any concern with cost at this time. No changes will continue to follow BP.  Patient Goals/Self-Care Activities Patient will:  - take medications as prescribed  [facility] Medication Assistance: None required.  Patient affirms current coverage meets needs.  [facility] [place] [address] [id number]: [phone]: [phone] Follow Up:  Patient agrees to Care Plan and Follow-up.

## 2022-09-24 DIAGNOSIS — R000 Tachycardia, unspecified: Secondary | ICD-10-CM | POA: Diagnosis not present

## 2022-09-24 DIAGNOSIS — Z79899 Other long term (current) drug therapy: Secondary | ICD-10-CM | POA: Diagnosis not present

## 2022-09-24 DIAGNOSIS — E559 Vitamin D deficiency, unspecified: Secondary | ICD-10-CM | POA: Diagnosis not present

## 2022-09-24 DIAGNOSIS — I4891 Unspecified atrial fibrillation: Secondary | ICD-10-CM | POA: Diagnosis not present

## 2022-09-24 DIAGNOSIS — E669 Obesity, unspecified: Secondary | ICD-10-CM | POA: Diagnosis not present

## 2022-09-24 DIAGNOSIS — Z1159 Encounter for screening for other viral diseases: Secondary | ICD-10-CM | POA: Diagnosis not present

## 2022-09-24 DIAGNOSIS — I619 Nontraumatic intracerebral hemorrhage, unspecified: Secondary | ICD-10-CM | POA: Diagnosis not present

## 2022-09-24 DIAGNOSIS — R109 Unspecified abdominal pain: Secondary | ICD-10-CM | POA: Diagnosis not present

## 2022-09-24 DIAGNOSIS — D6869 Other thrombophilia: Secondary | ICD-10-CM | POA: Diagnosis not present

## 2022-09-27 DIAGNOSIS — G629 Polyneuropathy, unspecified: Secondary | ICD-10-CM | POA: Diagnosis not present

## 2022-09-27 DIAGNOSIS — K6289 Other specified diseases of anus and rectum: Secondary | ICD-10-CM | POA: Diagnosis not present

## 2022-09-28 ENCOUNTER — Telehealth: Payer: Medicare HMO

## 2022-10-06 DIAGNOSIS — M19012 Primary osteoarthritis, left shoulder: Secondary | ICD-10-CM | POA: Diagnosis not present

## 2022-10-06 DIAGNOSIS — M25712 Osteophyte, left shoulder: Secondary | ICD-10-CM | POA: Diagnosis not present

## 2022-10-06 DIAGNOSIS — M25711 Osteophyte, right shoulder: Secondary | ICD-10-CM | POA: Diagnosis not present

## 2022-10-06 DIAGNOSIS — M25512 Pain in left shoulder: Secondary | ICD-10-CM | POA: Diagnosis not present

## 2022-10-06 DIAGNOSIS — M19011 Primary osteoarthritis, right shoulder: Secondary | ICD-10-CM | POA: Diagnosis not present

## 2022-10-06 DIAGNOSIS — M25511 Pain in right shoulder: Secondary | ICD-10-CM | POA: Diagnosis not present

## 2022-10-13 DIAGNOSIS — Z7189 Other specified counseling: Secondary | ICD-10-CM | POA: Diagnosis not present

## 2022-10-13 DIAGNOSIS — Z1159 Encounter for screening for other viral diseases: Secondary | ICD-10-CM | POA: Diagnosis not present

## 2022-10-13 DIAGNOSIS — E559 Vitamin D deficiency, unspecified: Secondary | ICD-10-CM | POA: Diagnosis not present

## 2022-10-13 DIAGNOSIS — T184XXA Foreign body in colon, initial encounter: Secondary | ICD-10-CM | POA: Diagnosis not present

## 2022-10-13 DIAGNOSIS — Z79899 Other long term (current) drug therapy: Secondary | ICD-10-CM | POA: Diagnosis not present

## 2022-10-13 DIAGNOSIS — Z8673 Personal history of transient ischemic attack (TIA), and cerebral infarction without residual deficits: Secondary | ICD-10-CM | POA: Diagnosis not present

## 2022-10-13 DIAGNOSIS — K629 Disease of anus and rectum, unspecified: Secondary | ICD-10-CM | POA: Diagnosis not present

## 2022-10-13 DIAGNOSIS — I700 Atherosclerosis of aorta: Secondary | ICD-10-CM | POA: Diagnosis not present

## 2022-10-13 DIAGNOSIS — E669 Obesity, unspecified: Secondary | ICD-10-CM | POA: Diagnosis not present

## 2022-10-13 DIAGNOSIS — Z0001 Encounter for general adult medical examination with abnormal findings: Secondary | ICD-10-CM | POA: Diagnosis not present

## 2022-10-13 DIAGNOSIS — I10 Essential (primary) hypertension: Secondary | ICD-10-CM | POA: Diagnosis not present

## 2022-10-15 ENCOUNTER — Other Ambulatory Visit: Payer: Self-pay | Admitting: Physician Assistant

## 2022-10-24 DIAGNOSIS — I10 Essential (primary) hypertension: Secondary | ICD-10-CM | POA: Diagnosis not present

## 2022-10-24 DIAGNOSIS — Z79899 Other long term (current) drug therapy: Secondary | ICD-10-CM | POA: Diagnosis not present

## 2022-10-24 DIAGNOSIS — I4891 Unspecified atrial fibrillation: Secondary | ICD-10-CM | POA: Diagnosis not present

## 2022-10-24 DIAGNOSIS — Z7982 Long term (current) use of aspirin: Secondary | ICD-10-CM | POA: Diagnosis not present

## 2022-10-24 DIAGNOSIS — Z888 Allergy status to other drugs, medicaments and biological substances status: Secondary | ICD-10-CM | POA: Diagnosis not present

## 2022-10-24 DIAGNOSIS — Z7901 Long term (current) use of anticoagulants: Secondary | ICD-10-CM | POA: Diagnosis not present

## 2022-10-24 DIAGNOSIS — Z881 Allergy status to other antibiotic agents status: Secondary | ICD-10-CM | POA: Diagnosis not present

## 2022-10-24 DIAGNOSIS — Z8673 Personal history of transient ischemic attack (TIA), and cerebral infarction without residual deficits: Secondary | ICD-10-CM | POA: Diagnosis not present

## 2022-10-24 DIAGNOSIS — Z885 Allergy status to narcotic agent status: Secondary | ICD-10-CM | POA: Diagnosis not present

## 2022-10-25 ENCOUNTER — Ambulatory Visit: Payer: Medicare HMO

## 2022-10-25 VITALS — Wt 213.0 lb

## 2022-10-25 DIAGNOSIS — Z0000 Encounter for general adult medical examination without abnormal findings: Secondary | ICD-10-CM

## 2022-10-25 NOTE — Progress Notes (Signed)
I connected with  [name] on 10/25/22 by a audio enabled telemedicine application and verified that I am speaking with the correct person using two identifiers.  Patient Location: Home  Provider Location: Office/Clinic  I discussed the limitations of evaluation and management by telemedicine. The patient expressed understanding and agreed to proceed.   Subjective:   Patrick Roth is a 76 y.o. male who presents for Medicare Annual/Subsequent preventive examination.  Review of Systems     Cardiac Risk Factors include: advanced age (>59mn, >>37women);dyslipidemia;male gender;hypertension;obesity (BMI >30kg/m2)     Objective:    Today's Vitals   10/25/22 1331  Weight: 213 lb (96.6 kg)   Body mass index is 32.39 kg/m.     10/25/2022    1:35 PM 09/18/2022   11:46 AM 09/15/2022    6:04 PM 09/07/2022    7:32 AM 07/04/2022    2:23 PM 04/14/2022    1:14 PM 12/25/2021   10:39 AM  Advanced Directives  Does Patient Have a Medical Advance Directive? _0  No No  Would patient like information on creating a medical advance directive? No - Patient declined No - Patient declined No - Patient declined No - Patient declined No - Patient declined  No - Patient declined    Current Medications (verified) Outpatient Encounter Medications as of 10/25/2022  Medication Sig   amLODipine (NORVASC) 10 MG tablet Take 10 mg by mouth daily.   apixaban (ELIQUIS) 5 MG TABS tablet TAKE 1 TABLET TWICE DAILY   COMIRNATY SUSP injection Inject into the muscle.   dofetilide (TIKOSYN) 250 MCG capsule Take 1 capsule by mouth twice daily   furosemide (LASIX) 20 MG tablet Take 1 tablet (20 mg total) by mouth daily.   metoprolol tartrate (LOPRESSOR) 25 MG tablet Take 25 mg by mouth as needed (if Heart right goes above 125).   oxyCODONE-acetaminophen (PERCOCET/ROXICET) 5-325 MG tablet Take 1 tablet by mouth every 8 (eight) hours as needed for moderate pain.   polyethylene glycol powder  (GLYCOLAX/MIRALAX) 17 GM/SCOOP powder Take 17 g by mouth in the morning and at bedtime. Start with 1 scoop 2 times per day until bowels are moving, then reduce to 1 scoop daily. Drink 64oz of water daily. (Patient taking differently: Take 17 g by mouth daily as needed for mild constipation.)   potassium chloride SA (KLOR-CON M) 20 MEQ tablet TAKE 1 TABLET IN THE MORNING AND TAKE 2 TABLETS IN THE EVENING   pregabalin (LYRICA) 50 MG capsule Take 50 mg by mouth 3 (three) times daily.   REPATHA SURECLICK 1915MG/ML SOAJ INJECT 1 PEN INTO THE SKIN EVERY 14 DAYS.   Vitamin D, Ergocalciferol, (DRISDOL) 1.25 MG (50000 UNIT) CAPS capsule Take 1 capsule (50,000 Units total) by mouth every 7 (seven) days.   No facility-administered encounter medications on file as of 10/25/2022.    Allergies (verified) Crestor [rosuvastatin calcium], Spironolactone, Terazosin, Amiodarone, Atenolol, Bystolic [nebivolol hcl], Gabapentin, Hydralazine, Tizanidine, Clonidine, Hydrocodone-acetaminophen, Levofloxacin, Losartan, Maxzide [hydrochlorothiazide w-triamterene], Olmesartan, and Tussionex pennkinetic er [hydrocod poli-chlorphe poli er]   History: Past Medical History:  Diagnosis Date   Atrial fibrillation/flutter    Intol of amio, beta blocker // Echo 12/22: inf HK, EF 50-55, normal RVSF, mild LaE, mild MR, mod MAC, AV sclerosis w/o aS   Colon polyps    Constipation 05/27/2015   DOE (dyspnea on exertion) 10/13/2015   Faintness 06/20/2016   History of chicken pox    Hyperlipidemia    Hypertension  Internal hemorrhoids    Polycythemia    Sciatica    Stroke [facility])    Hx of stroke in2008; recurrent CVA in 12/2020   Syncope and collapse 06/28/2016   Past Surgical History:  Procedure Laterality Date   CARDIOVERSION N/A 09/02/2017   Procedure: CARDIOVERSION;  Surgeon: [name], MD;  Location[facility] ENDOSCOPY;  Service: Cardiovascular;  Laterality: N/A;   CARDIOVERSION N/A 07/06/2022   Procedure: CARDIOVERSION;   Surgeon: Freada Bergeron, MD;  Location: [facility] ENDOSCOPY;  Service: Cardiovascular;  Laterality: N/A;   COLONOSCOPY  2011   great toe surgery     PROSTATE BIOPSY     Family History  Problem Relation Age of Onset   Hypertension Father 32       Deceased   Heart disease Father    Lung cancer Mother 98       Deceased   Healthy Sister        x3   Healthy Brother        x4   Heart disease Brother        #5   Other Daughter        Alpha Thalassemia   Lupus Daughter        #2-deceased   Diabetes Neg Hx    Heart attack Neg Hx    Hyperlipidemia Neg Hx    Sudden death Neg Hx    Social History   Socioeconomic History   Marital status: Married    Spouse name: [name]   Number of children: 1   Years of education: 12   Highest education level: Not on file  Occupational History   Occupation: Retired  Tobacco Use   Smoking status: Never   Smokeless tobacco: Never   Tobacco comments:    Never smoke 03/11/22  Vaping Use   Vaping Use: Never used  Substance and Sexual Activity   Alcohol use: No   Drug use: No   Sexual activity: Not on file  Other Topics Concern   Not on file  Social History Narrative   Lives with wife   Right Handed   Drinks no caffeine   Social Determinants of Health   Financial Resource Strain: Low Risk  (10/25/2022)   Overall Financial Resource Strain (CARDIA)    Difficulty of Paying Living Expenses: Not hard at all  Food Insecurity: No Food Insecurity (10/25/2022)   Hunger Vital Sign    Worried About Running Out of Food in the Last Year: Never true    [place] in the Last Year: Never true  Transportation Needs: No Transportation Needs (10/25/2022)   PRAPARE - Hydrologist (Medical): No    Lack of Transportation (Non-Medical): No  Physical Activity: Inactive (10/25/2022)   Exercise Vital Sign    Days of Exercise per Week: 0 days    Minutes of Exercise per Session: 0 min  Stress: No Stress Concern Present  (10/25/2022)   [place]    Feeling of Stress : Not at all  Social Connections: Moderately Isolated (10/25/2022)   Social Connection and Isolation Panel [NHANES]    Frequency of Communication with Friends and Family: More than three times a week    Frequency of Social Gatherings with Friends and Family: More than three times a week    Attends Religious Services: Never    Marine scientist or Organizations: No    Attends Archivist Meetings: Never  Marital Status: Married    Tobacco Counseling Counseling given: Not Answered Tobacco comments: Never smoke 03/11/22   Clinical Intake:  Pre-visit preparation completed: Yes  Pain : No/denies pain     BMI - recorded: 32.39 Nutritional Status: BMI > 30  Obese Nutritional Risks: None Diabetes: No  How often do you need to have someone help you when you read instructions, pamphlets, or other written materials from your doctor or pharmacy?: 1 - Never  Diabetic?no  Interpreter Needed?: No  Information entered by :: Charlott Rakes, LPN   Activities of Daily Living    10/25/2022    1:36 PM 07/05/2022    3:00 PM  In your present state of health, do you have any difficulty performing the following activities:  Hearing? 0 0  Vision? 0 0  Difficulty concentrating or making decisions? 0 0  Walking or climbing stairs? 0 0  Dressing or bathing? 0 0  Doing errands, shopping? 0 0  Preparing Food and eating ? N   Using the Toilet? N   In the past six months, have you accidently leaked urine? N   Do you have problems with loss of bowel control? N   Managing your Medications? N   Managing your Finances? N   Housekeeping or managing your Housekeeping? N     Patient Care Team: Allwardt, [name], PA-C as PCP - General (Physician Assistant) Constance Haw, MD as PCP - Electrophysiology (Cardiology) [name], Wonda Cheng, MD as PCP - Cardiology  (Cardiology) Irene Shipper, MD as Consulting Physician (Gastroenterology) [name], Wonda Cheng, MD as Consulting Physician (Cardiology) Christy Sartorius, MD as Referring Physician (Urology) Inocencio Homes, [place] as Consulting Physician (Podiatry) Myrle Sheng, MD as Referring Physician (Neurosurgery) Begovich, Geradine Girt, DO (Sports Medicine) Edythe Clarity, [facility] (Pharmacist)  Indicate any recent Medical Services you may have received from other than [facility] providers in the past year (date may be approximate).     Assessment:   This is a routine wellness examination for [place].  Hearing/Vision screen Hearing Screening - Comments:: Pt denies any hearing issues  Vision Screening - Comments:: Pt follows up with [facility] for annul eye exams   Dietary issues and exercise activities discussed: Current Exercise Habits: The patient does not participate in regular exercise at present   Goals Addressed             This Visit's Progress    Patient Stated       Stay healthy        Depression Screen    10/25/2022    1:34 PM 08/18/2022    9:29 AM 10/19/2021    9:31 AM 10/12/2021    2:10 PM 01/28/2021    9:36 AM 10/06/2020    3:09 PM 10/23/2019    9:34 AM  PHQ 2/9 Scores  PHQ - 2 Score 0 0 0 0 0 0 0  PHQ- 9 Score     0  0    Fall Risk    10/25/2022    1:36 PM 08/18/2022    9:28 AM 05/12/2022    8:22 AM 10/19/2021    9:31 AM 10/12/2021    2:14 PM  [place] in the past year? 0 0 0 0 1  Number falls in past yr: 0 0 0  1  Injury with Fall? 0 0 0  0  Comment     no visible injury however soreness  Risk for fall due to : Impaired  vision No Fall Risks No Fall Risks    Follow up Falls prevention discussed Falls evaluation completed Falls evaluation completed      [place]:  Any stairs in or around the home? Yes  If so, are there any without handrails? No  Home free of loose throw rugs in walkways, pet beds, electrical cords,  etc? Yes  Adequate lighting in your home to reduce risk of falls? Yes   ASSISTIVE DEVICES UTILIZED TO PREVENT FALLS:  Life alert? No  Use of a cane, walker or w/c? No  Grab bars in the bathroom? No  Shower chair or bench in shower? No  Elevated toilet seat or a handicapped toilet? No   TIMED UP AND GO:  Was the test performed? No .   Cognitive Function:    08/11/2018    8:52 AM 08/03/2017    8:25 AM  MMSE - Mini Mental State Exam  Orientation to time 5 5  Orientation to Place 5 5  Registration 3 3  Attention/ Calculation 5 2  Recall 1 3  Language- name 2 objects 2 2  Language- repeat 1 1  Language- follow 3 step command 3 3  Language- read & follow direction 1 1  Write a sentence 1 1  Copy design 1 1  Total score 28 27        10/25/2022    1:36 PM 10/12/2021    2:23 PM  6CIT Screen  What Year? 0 points 0 points  What month? 0 points 0 points  What time? 3 points 0 points  Count back from 20 0 points 0 points  Months in reverse 0 points 0 points  Repeat phrase 4 points 10 points  Total Score 7 points 10 points    Immunizations Immunization History  Administered Date(s) Administered   Fluad Quad(high Dose 65+) 08/18/2022   Influenza,inj,Quad PF,6+ Mos 07/23/2015   Influenza-Unspecified 07/23/2018   PFIZER(Purple Top)SARS-COV-2 Vaccination 12/24/2019, 01/21/2020, 09/06/2020   Pfizer Covid-19 Vaccine Bivalent Booster 64yr & up 09/30/2022   Pneumococcal Polysaccharide-23 11/13/2021   Tdap 07/05/2013    TDAP status: Up to date  Flu Vaccine status: Up to date  Pneumococcal vaccine status: Due, Education has been provided regarding the importance of this vaccine. Advised may receive this vaccine at local pharmacy or Health Dept. Aware to provide a copy of the vaccination record if obtained from local pharmacy or Health Dept. Verbalized acceptance and understanding.  Covid-19 vaccine status: Completed vaccines  Qualifies for Shingles Vaccine? Yes   Zostavax  completed No   Shingrix Completed?: No.    Education has been provided regarding the importance of this vaccine. Patient has been advised to call insurance company to determine out of pocket expense if they have not yet received this vaccine. Advised may also receive vaccine at local pharmacy or Health Dept. Verbalized acceptance and understanding.  Screening Tests Health Maintenance  Topic Date Due   Pneumonia Vaccine 77 Years old (2 - PCV) 11/13/2022   Zoster Vaccines- Shingrix (1 of 2) 11/17/2022 (Originally 02/04/1965)   COVID-19 Vaccine (5 - 2023-24 season) 11/25/2022   DTaP/Tdap/Td (2 - Td or Tdap) 07/06/2023   Medicare Annual Wellness (AWV)  10/26/2023   INFLUENZA VACCINE  Completed   Hepatitis C Screening  Completed   HPV VACCINES  Aged Out   COLONOSCOPY (Pts 45-426yrInsurance coverage will need to be confirmed)  Discontinued    Health Maintenance  Health Maintenance Due  Topic Date Due  Pneumonia Vaccine 20+ Years old (2 - PCV) 11/13/2022    Colorectal cancer screening: No longer required.    Additional Screening:  Hepatitis C Screening:  Completed 11/14/19  Vision Screening: Recommended annual ophthalmology exams for early detection of glaucoma and other disorders of the eye. Is the patient up to date with their annual eye exam?  Yes  Who is the provider or what is the name of the office in which the patient attends annual eye exams? [facility]  If pt is not established with a provider, would they like to be referred to a provider to establish care? No .   Dental Screening: Recommended annual dental exams for proper oral hygiene  Community Resource Referral / Chronic Care Management: CRR required this visit?  No   CCM required this visit?  No      Plan:     I have personally reviewed and noted the following in the patient's chart:   Medical and social history Use of alcohol, tobacco or illicit drugs  Current medications and supplements including  opioid prescriptions. Patient is currently taking opioid prescriptions. Information provided to patient regarding non-opioid alternatives. Patient advised to discuss non-opioid treatment plan with their provider. Functional ability and status Nutritional status Physical activity Advanced directives List of other physicians Hospitalizations, surgeries, and ER visits in previous 12 months Vitals Screenings to include cognitive, depression, and falls Referrals and appointments  In addition, I have reviewed and discussed with patient certain preventive protocols, quality metrics, and best practice recommendations. A written personalized care plan for preventive services as well as general preventive health recommendations were provided to patient.     Willette Brace, LPN   [id number]   Nurse Notes: none

## 2022-10-27 ENCOUNTER — Telehealth: Payer: Self-pay | Admitting: Pharmacist

## 2022-10-27 NOTE — Progress Notes (Signed)
Chronic Care Management Pharmacy Assistant   Name: Patrick Roth  MRN: [id number] DOB: Feb 15, 1946   Reason for Encounter: Hypertension Adherence Call    Recent office visits:  None  Recent consult visits:  09/27/2022 OV (Gastro) Spurling, Sheppard Plumber, PA-C; no medication changes indicated.  10/06/2022 OV (Ortho Surg) Presley Raddle; no further information available.  Hospital visits:  10/24/2022 ED visit for Generalized Abdominal pain -No medication changes noted.  09/21/2022 ED visit for Hiatal hernia START lansoprazole (PREVACID) 30 mg capsule Take one capsule (30 mg dose) by mouth daily   09/18/2022 ED visit for Lower abdominal pain -No medication changes noted.  09/15/2022 ED visit for Abdominal pain -No medication changes noted.  Medications: Outpatient Encounter Medications as of 10/27/2022  Medication Sig   amLODipine (NORVASC) 10 MG tablet Take 10 mg by mouth daily.   apixaban (ELIQUIS) 5 MG TABS tablet TAKE 1 TABLET TWICE DAILY   COMIRNATY SUSP injection Inject into the muscle.   dofetilide (TIKOSYN) 250 MCG capsule Take 1 capsule by mouth twice daily   furosemide (LASIX) 20 MG tablet Take 1 tablet (20 mg total) by mouth daily.   metoprolol tartrate (LOPRESSOR) 25 MG tablet Take 25 mg by mouth as needed (if Heart right goes above 125).   oxyCODONE-acetaminophen (PERCOCET/ROXICET) 5-325 MG tablet Take 1 tablet by mouth every 8 (eight) hours as needed for moderate pain.   polyethylene glycol powder (GLYCOLAX/MIRALAX) 17 GM/SCOOP powder Take 17 g by mouth in the morning and at bedtime. Start with 1 scoop 2 times per day until bowels are moving, then reduce to 1 scoop daily. Drink 64oz of water daily. (Patient taking differently: Take 17 g by mouth daily as needed for mild constipation.)   potassium chloride SA (KLOR-CON M) 20 MEQ tablet TAKE 1 TABLET IN THE MORNING AND TAKE 2 TABLETS IN THE EVENING   pregabalin (LYRICA) 50 MG capsule Take 50 mg by mouth 3  (three) times daily.   REPATHA SURECLICK 528 MG/ML SOAJ INJECT 1 PEN INTO THE SKIN EVERY 14 DAYS.   Vitamin D, Ergocalciferol, (DRISDOL) 1.25 MG (50000 UNIT) CAPS capsule Take 1 capsule (50,000 Units total) by mouth every 7 (seven) days.   No facility-administered encounter medications on file as of 10/27/2022.   Reviewed chart prior to disease state call. Spoke with patient regarding BP  Recent Office Vitals: BP Readings from Last 3 Encounters:  09/19/22 (!) 153/101  09/15/22 (!) 169/102  09/07/22 (!) 140/102   Pulse Readings from Last 3 Encounters:  09/19/22 98  09/15/22 85  09/07/22 82    Wt Readings from Last 3 Encounters:  10/25/22 213 lb (96.6 kg)  09/15/22 213 lb 13.5 oz (97 kg)  09/07/22 213 lb 13.5 oz (97 kg)     Kidney Function Lab Results  Component Value Date/Time   CREATININE 1.25 (H) 09/18/2022 11:53 AM   CREATININE 1.26 (H) 09/15/2022 06:14 PM   CREATININE 1.27 (H) 05/15/2020 09:54 AM   CREATININE 1.37 (H) 02/13/2020 09:24 AM   CREATININE 1.20 (H) 10/25/2016 11:40 AM   CREATININE 1.36 (H) 09/14/2016 10:19 AM   GFR 67.92 02/15/2022 11:47 AM   GFRNONAA 59 (L) 09/18/2022 11:53 AM   GFRNONAA 55 (L) 05/15/2020 09:54 AM   GFRAA 58 (L) 01/07/2021 10:01 AM   GFRAA >60 05/15/2020 09:54 AM       Latest Ref Rng & Units 09/18/2022   11:53 AM 09/15/2022    6:14 PM 09/07/2022    7:30 AM  BMP  Glucose 70 - 99 mg/dL 101  118  120   BUN 8 - 23 mg/dL '12  10  10   '$ Creatinine 0.61 - 1.24 mg/dL 1.25  1.26  1.23   Sodium 135 - 145 mmol/L 139  139  142   Potassium 3.5 - 5.1 mmol/L 4.1  3.8  4.0   Chloride 98 - 111 mmol/L 105  104  103   CO2 22 - 32 mmol/L '24  24  27   '$ Calcium 8.9 - 10.3 mg/dL 9.1  9.3  10.1     Current antihypertensive regimen:  Amlodipine 10 mg daily Furosemide 20 mg daily Metoprolol Tartrate 25 mg as needed  How often are you checking your Blood Pressure? daily  Current home BP readings: 120/77 55 HR  What recent interventions/DTPs have been  made by any provider to improve Blood Pressure control since last CPP Visit: No recent interventions or DTPs.  Any recent hospitalizations or ED visits since last visit with CPP? Yes  What diet changes have been made to improve Blood Pressure Control?  Patient states his diet is "okay". He said he's not able to eat much at one time.  What exercise is being done to improve your Blood Pressure Control?  Patient does not exercise.  Adherence Review: Is the patient currently on ACE/ARB medication? No Does the patient have >5 day gap between last estimated fill dates? No  -Patient scheduled a follow up telephone call with clinical pharmacist on 12/14/2021.  Care Gaps: Medicare Annual Wellness: Completed 10/26/2023 Hemoglobin A1C: 5.8% on 01/17/2021 Colonoscopy: Completed 11/04/2020  Future Appointments  Date Time Provider [place]  11/10/2022  9:00 AM Bo Merino, MD [facility] None  12/14/2022 10:30 AM [facility]CM PHARMACIST [facility]  02/16/2023  9:00 AM Allwardt, [name], PA-C [facility]   Star Rating Drugs: None  April D Calhoun, [place] Pharmacist Assistant [phone]

## 2022-10-28 NOTE — Progress Notes (Deleted)
Office Visit Note  Patient: Patrick Roth             Date of Birth: 09/17/1946           MRN: [id number]             PCP: [name], PA-C Referring: Allwardt, [name], PA-C Visit Date: 11/10/2022 Occupation: _0 @  Subjective:  No chief complaint on file.   History of Present Illness: Patrick Roth is a 76 y.o. male ***   Activities of Daily Living:  Patient reports morning stiffness for *** {minute/hour:19697}.   Patient {ACTIONS;DENIES/REPORTS:21021675::"Denies"} nocturnal pain.  Difficulty dressing/grooming: {ACTIONS;DENIES/REPORTS:21021675::"Denies"} Difficulty climbing stairs: {ACTIONS;DENIES/REPORTS:21021675::"Denies"} Difficulty getting out of chair: {ACTIONS;DENIES/REPORTS:21021675::"Denies"} Difficulty using hands for taps, buttons, cutlery, and/or writing: {ACTIONS;DENIES/REPORTS:21021675::"Denies"}  No Rheumatology ROS completed.   PMFS History:  Patient Active Problem List   Diagnosis Date Noted   Atrial fibrillation with RVR ([place]) 07/04/2022   Afib ([place]) 11/10/2021   Dyssynergic defecation 11/01/2021   GERD (gastroesophageal reflux disease) 11/01/2021   PAF (paroxysmal atrial fibrillation) ([place]) 10/31/2021   Atrial fibrillation with rapid ventricular response (HCC)    Aortic atherosclerosis ([place]) 10/28/2021   Wide-complex tachycardia 10/28/2021   Rectal sphincter spasm 10/28/2021   Paroxysmal atrial fibrillation ([place]) 05/26/2021   Polycythemia 05/26/2021   Statin myopathy 01/29/2021   Acute arterial ischemic stroke, multifocal, anterior circulation, left ([place]) 01/16/2021   Secondary hypercoagulable state ([place]) 09/11/2020   Cyst of perineum in male 11/28/2018   Mixed hyperlipidemia    History of chicken pox    Colon polyps    Atrial flutter with rapid ventricular response ([place])    Other intervertebral disc degeneration, lumbar region 05/09/2017   Lumbar radiculopathy 03/29/2017   Persistent atrial fibrillation ([place]) 09/14/2016    Diuretic-induced hypokalemia 06/28/2016   Essential hypertension 05/27/2015   Constipation due to pain medication 05/27/2015   Mallet deformity of fourth finger, right 07/11/2013   Stroke ([place]) 08/23/2012    Past Medical History:  Diagnosis Date   Atrial fibrillation/flutter    Intol of amio, beta blocker // Echo 12/22: inf HK, EF 50-55, normal RVSF, mild LaE, mild MR, mod MAC, AV sclerosis w/o aS   Colon polyps    Constipation 05/27/2015   DOE (dyspnea on exertion) 10/13/2015   Faintness 06/20/2016   History of chicken pox    Hyperlipidemia    Hypertension    Internal hemorrhoids    Polycythemia    Sciatica    Stroke ([place])    Hx of stroke in2008; recurrent CVA in 12/2020   Syncope and collapse 06/28/2016    Family History  Problem Relation Age of Onset   Hypertension Father 54       Deceased   Heart disease Father    Lung cancer Mother 69       Deceased   Healthy Sister        x3   Healthy Brother        x4   Heart disease Brother        #5   Other Daughter        Alpha Thalassemia   Lupus Daughter        #2-deceased   Diabetes Neg Hx    Heart attack Neg Hx    Hyperlipidemia Neg Hx    Sudden death Neg Hx    Past Surgical History:  Procedure Laterality Date   CARDIOVERSION N/A 09/02/2017   Procedure: CARDIOVERSION;  Surgeon: [name], MD;  Location: [facility]  ENDOSCOPY;  Service: Cardiovascular;  Laterality: N/A;   CARDIOVERSION N/A 07/06/2022   Procedure: CARDIOVERSION;  Surgeon: Freada Bergeron, MD;  Location: [facility] ENDOSCOPY;  Service: Cardiovascular;  Laterality: N/A;   COLONOSCOPY  2011   great toe surgery     PROSTATE BIOPSY     Social History   Social History Narrative   Lives with wife   Right Handed   Drinks no caffeine   Immunization History  Administered Date(s) Administered   Fluad Quad(high Dose 65+) 08/18/2022   Influenza,inj,Quad PF,6+ Mos 07/23/2015   Influenza-Unspecified 07/23/2018   PFIZER(Purple Top)SARS-COV-2 Vaccination  12/24/2019, 01/21/2020, 09/06/2020   Pfizer Covid-19 Vaccine Bivalent Booster 43yr & up 09/30/2022   Pneumococcal Polysaccharide-23 11/13/2021   Tdap 07/05/2013     Objective: Vital Signs: There were no vitals taken for this visit.   Physical Exam   Musculoskeletal Exam: ***  CDAI Exam: CDAI Score: -- Patient Global: --; Provider Global: -- Swollen: --; Tender: -- Joint Exam 11/10/2022   No joint exam has been documented for this visit   There is currently no information documented on the homunculus. Go to the Rheumatology activity and complete the homunculus joint exam.  Investigation: No additional findings.  Imaging: No results found.  Recent Labs: Lab Results  Component Value Date   WBC 3.3 (L) 09/18/2022   HGB 16.4 09/18/2022   PLT 161 09/18/2022   NA 139 09/18/2022   K 4.1 09/18/2022   CL 105 09/18/2022   CO2 24 09/18/2022   GLUCOSE 101 (H) 09/18/2022   BUN 12 09/18/2022   CREATININE 1.25 (H) 09/18/2022   BILITOT 1.6 (H) 09/18/2022   ALKPHOS 77 09/18/2022   AST 26 09/18/2022   ALT 18 09/18/2022   PROT 7.4 09/18/2022   ALBUMIN 3.9 09/18/2022   CALCIUM 9.1 09/18/2022   GFRAA 58 (L) 01/07/2021    Speciality Comments: No specialty comments available.  Procedures:  No procedures performed Allergies: Crestor [rosuvastatin calcium], Spironolactone, Terazosin, Amiodarone, Atenolol, Bystolic [nebivolol hcl], Gabapentin, Hydralazine, Tizanidine, Clonidine, Hydrocodone-acetaminophen, Levofloxacin, Losartan, Maxzide [hydrochlorothiazide w-triamterene], Olmesartan, and Tussionex pennkinetic er [[employer]flac Incorporatedpoli-chlorphe poli er]   Assessment / Plan:     Visit Diagnoses: Vitamin D insufficiency  Lumbar radiculopathy  Statin myopathy  Acute arterial ischemic stroke, multifocal, anterior circulation, left (HCC)  Aortic atherosclerosis (HCC)  Atrial fibrillation with rapid ventricular response (HCC)  Essential hypertension  History of gastroesophageal  reflux (GERD)  Hx of colonic polyps  Polycythemia  Diuretic-induced hypokalemia  Secondary hypercoagulable state ([place]  Mixed hyperlipidemia  Orders: No orders of the defined types were placed in this encounter.  No orders of the defined types were placed in this encounter.   Face-to-face time spent with patient was *** minutes. Greater than 50% of time was spent in counseling and coordination of care.  Follow-Up Instructions: No follow-ups on file.   TOfilia Neas PA-C  Note - This record has been created using Dragon software.  Chart creation errors have been sought, but may not always  have been located. Such creation errors do not reflect on  the standard of medical care.,

## 2022-11-10 ENCOUNTER — Ambulatory Visit: Payer: Medicare PPO | Attending: Rheumatology | Admitting: Rheumatology

## 2022-11-10 DIAGNOSIS — I700 Atherosclerosis of aorta: Secondary | ICD-10-CM

## 2022-11-10 DIAGNOSIS — M5416 Radiculopathy, lumbar region: Secondary | ICD-10-CM

## 2022-11-10 DIAGNOSIS — I4891 Unspecified atrial fibrillation: Secondary | ICD-10-CM

## 2022-11-10 DIAGNOSIS — D6869 Other thrombophilia: Secondary | ICD-10-CM

## 2022-11-10 DIAGNOSIS — T466X5A Adverse effect of antihyperlipidemic and antiarteriosclerotic drugs, initial encounter: Secondary | ICD-10-CM

## 2022-11-10 DIAGNOSIS — E782 Mixed hyperlipidemia: Secondary | ICD-10-CM

## 2022-11-10 DIAGNOSIS — I63522 Cerebral infarction due to unspecified occlusion or stenosis of left anterior cerebral artery: Secondary | ICD-10-CM

## 2022-11-10 DIAGNOSIS — E559 Vitamin D deficiency, unspecified: Secondary | ICD-10-CM

## 2022-11-10 DIAGNOSIS — Z8719 Personal history of other diseases of the digestive system: Secondary | ICD-10-CM

## 2022-11-10 DIAGNOSIS — T502X5A Adverse effect of carbonic-anhydrase inhibitors, benzothiadiazides and other diuretics, initial encounter: Secondary | ICD-10-CM

## 2022-11-10 DIAGNOSIS — D751 Secondary polycythemia: Secondary | ICD-10-CM

## 2022-11-10 DIAGNOSIS — Z86010 Personal history of colonic polyps: Secondary | ICD-10-CM

## 2022-11-10 DIAGNOSIS — I10 Essential (primary) hypertension: Secondary | ICD-10-CM

## 2022-12-02 ENCOUNTER — Other Ambulatory Visit (HOSPITAL_COMMUNITY): Payer: Self-pay

## 2022-12-08 NOTE — Progress Notes (Signed)
Care Management & Coordination Services Pharmacy Note  12/14/2022 Name:  Patrick Roth MRN:  [id number] DOB:  03/06/1946  Summary: PharmD FU visit.  Patient BP has improved since his pain is more controlled now.  He is overdue for lipid panel on Repatha and last recorded LDL was 03/2021/  Recommendations/Changes made from today's visit: Schedule FU with Alyssa for physical  Follow up plan: FU 6 months with me CMA to check BP in 3 months   Subjective: Patrick Roth is an 77 y.o. year old male who is a primary patient of Allwardt, Alyssa M, PA-C.  The care coordination team was consulted for assistance with disease management and care coordination needs.    Engaged with patient by telephone for follow up visit.  Recent office visits:  None   Recent consult visits:  09/27/2022 OV (Gastro) Spurling, Sheppard Plumber, PA-C; no medication changes indicated.   10/06/2022 OV (Ortho Surg) Presley Raddle; no further information available.   Hospital visits:  10/24/2022 ED visit for Generalized Abdominal pain -No medication changes noted.   09/21/2022 ED visit for Hiatal hernia START lansoprazole (PREVACID) 30 mg capsule Take one capsule (30 mg dose) by mouth daily    09/18/2022 ED visit for Lower abdominal pain -No medication changes noted.   09/15/2022 ED visit for Abdominal pain   Objective:  Lab Results  Component Value Date   CREATININE 1.25 (H) 09/18/2022   BUN 12 09/18/2022   GFR 67.92 02/15/2022   EGFR 59 (L) 08/10/2022   GFRNONAA 59 (L) 09/18/2022   GFRAA 58 (L) 01/07/2021   NA 139 09/18/2022   K 4.1 09/18/2022   CALCIUM 9.1 09/18/2022   CO2 24 09/18/2022   GLUCOSE 101 (H) 09/18/2022    Lab Results  Component Value Date/Time   HGBA1C 5.8 (H) 01/17/2021 12:36 AM   HGBA1C 5.8 10/23/2019 10:00 AM   GFR 67.92 02/15/2022 11:47 AM   GFR 58.48 (L) 10/27/2020 01:24 PM    Last diabetic Eye exam: No results found for: "HMDIABEYEEXA"  Last diabetic Foot exam:  No results found for: "HMDIABFOOTEX"   Lab Results  Component Value Date   CHOL 154 04/13/2021   HDL 55 04/13/2021   LDLCALC 83 04/13/2021   TRIG 87 04/13/2021   CHOLHDL 2.8 04/13/2021       Latest Ref Rng & Units 09/18/2022   11:53 AM 09/15/2022    6:14 PM 07/11/2022   12:28 PM  Hepatic Function  Total Protein 6.5 - 8.1 g/dL 7.4  7.4  7.2   Albumin 3.5 - 5.0 g/dL 3.9  4.4  4.1   AST 15 - 41 U/L '26  21  25   '$ ALT 0 - 44 U/L '18  12  15   '$ Alk Phosphatase 38 - 126 U/L 77  77  84   Total Bilirubin 0.3 - 1.2 mg/dL 1.6  1.7  1.3     Lab Results  Component Value Date/Time   TSH 1.342 07/04/2022 03:15 PM   TSH 1.470 05/03/2022 11:20 AM   TSH 1.89 02/15/2022 11:47 AM   FREET4 0.67 02/15/2022 11:47 AM       Latest Ref Rng & Units 09/18/2022   11:53 AM 09/15/2022    6:14 PM 09/07/2022    7:30 AM  CBC  WBC 4.0 - 10.5 K/uL 3.3  4.9  3.5   Hemoglobin 13.0 - 17.0 g/dL 16.4  16.3  17.0   Hematocrit 39.0 - 52.0 % 51.2  50.9  52.2   Platelets 150 - 400 K/uL 161  161  168     Lab Results  Component Value Date/Time   VD25OH 17.4 (L) 05/03/2022 11:20 AM   CNOBSJGG83 662 05/03/2022 11:20 AM   HUTMLYYT03 546 01/02/2020 09:52 AM    Clinical ASCVD: Yes  The ASCVD Risk score (Arnett DK, et al., 2019) failed to calculate for the following reasons:   The patient has a prior MI or stroke diagnosis        10/25/2022    1:34 PM 08/18/2022    9:29 AM 10/19/2021    9:31 AM  Depression screen PHQ 2/9  Decreased Interest 0 0 0  Down, Depressed, Hopeless 0 0 0  PHQ - 2 Score 0 0 0     Social History   Tobacco Use  Smoking Status Never  Smokeless Tobacco Never  Tobacco Comments   Never smoke 03/11/22   BP Readings from Last 3 Encounters:  09/19/22 (!) 153/101  09/15/22 (!) 169/102  09/07/22 (!) 140/102   Pulse Readings from Last 3 Encounters:  09/19/22 98  09/15/22 85  09/07/22 82   Wt Readings from Last 3 Encounters:  10/25/22 213 lb (96.6 kg)  09/15/22 213 lb 13.5 oz  (97 kg)  09/07/22 213 lb 13.5 oz (97 kg)   BMI Readings from Last 3 Encounters:  10/25/22 32.39 kg/m  09/15/22 32.52 kg/m  09/07/22 32.52 kg/m    Allergies  Allergen Reactions   Crestor [Rosuvastatin Calcium] Shortness Of Breath    Muscle cramps   Spironolactone Shortness Of Breath   Terazosin Shortness Of Breath, Palpitations and Other (See Comments)    Nerve pain, also   Amiodarone Other (See Comments)    Dropped pulse real low to 45-50, and made him feel very cold   Atenolol Other (See Comments)    Drowsiness and "flu sxs"   Bystolic [Nebivolol Hcl] Other (See Comments)    GI issues   Gabapentin Other (See Comments)    "Did not help with pain"   Hydralazine Other (See Comments)    Joint swelling   Tizanidine Other (See Comments)    Syncope, Elevated BP/pulse   Clonidine Other (See Comments)    Unknown reaction   Hydrocodone-Acetaminophen Other (See Comments)    Unknown reaction   Levofloxacin Other (See Comments)    Unknown reaction   Losartan Other (See Comments)    Did not work.  Pt states he couldn't take b/c of SE, but can't remember what SE were.   Maxzide [Hydrochlorothiazide W-Triamterene] Other (See Comments)    Does not tolerate potassium-sparing diuretics   Olmesartan Other (See Comments)    Unknown reaction   Tussionex Pennkinetic Er [Hydrocod Poli-Chlorphe Poli Er] Other (See Comments)    Unknown reaction    Medications Reviewed Today     Reviewed by Willette Brace, LPN (Licensed Practical Nurse) on 10/25/22 at 1333  Med List Status: <None>   Medication Order Taking? Sig Documenting Provider Last Dose Status Informant  amLODipine (NORVASC) 10 MG tablet [id number] Yes Take 10 mg by mouth daily. [provider] Taking Active Self  apixaban (ELIQUIS) 5 MG TABS tablet [id number] Yes TAKE 1 TABLET TWICE DAILY [name], Wonda Cheng, MD Taking Active   dofetilide [facility]) 250 MCG capsule [id number] Yes Take 1 capsule by mouth twice daily Camnitz,  Will Hassell Done, MD Taking Active   furosemide (LASIX) 20 MG tablet [id number] Yes Take 1 tablet (20 mg total) by mouth daily. Baldwin Jamaica, PA-C  Taking Active Self  metoprolol tartrate (LOPRESSOR) 25 MG tablet [id number] Yes Take 25 mg by mouth as needed (if Heart right goes above 125). [provider] Taking Active Self  oxyCODONE-acetaminophen (PERCOCET/ROXICET) 5-325 MG tablet [id number] Yes Take 1 tablet by mouth every 8 (eight) hours as needed for moderate pain. [provider] Taking Active Self  polyethylene glycol powder (GLYCOLAX/MIRALAX) 17 GM/SCOOP powder [id number] Yes Take 17 g by mouth in the morning and at bedtime. Start with 1 scoop 2 times per day until bowels are moving, then reduce to 1 scoop daily. Drink 64oz of water daily.  Patient taking differently: Take 17 g by mouth daily as needed for mild constipation.   Jeanie Sewer, NP Taking Active Self           Med Note Ishmael Holter, APRIL S   Tue Nov 10, 2021 10:16 AM)    potassium chloride SA (KLOR-CON M) 20 MEQ tablet [id number] Yes TAKE 1 TABLET IN THE MORNING AND TAKE 2 TABLETS IN THE EVENING Allwardt, Alyssa M, PA-C Taking Active   pregabalin (LYRICA) 50 MG capsule [id number] Yes Take 50 mg by mouth 3 (three) times daily. [provider] Taking Active Self  REPATHA SURECLICK 428 MG/ML SOAJ [id number] Yes INJECT 1 PEN INTO THE SKIN EVERY 14 DAYS. Camnitz, Ocie Doyne, MD Taking Active   Vitamin D, Ergocalciferol, (DRISDOL) 1.25 MG (50000 UNIT) CAPS capsule [id number] Yes Take 1 capsule (50,000 Units total) by mouth every 7 (seven) days. Allwardt, [name], PA-C Taking Active Self            SDOH:  (Social Determinants of Health) assessments and interventions performed: No, done within year Financial Resource Strain: Low Risk  (10/25/2022)   Overall Financial Resource Strain (CARDIA)    Difficulty of Paying Living Expenses: Not hard at all    SDOH Interventions    Flowsheet Row Clinical Support from  10/25/2022 in [place] Management from 09/18/2020 in [place] [facility] [place] Management from 03/20/2020 in [place] [facility] [place] Interventions Intervention Not Indicated Other (Comment) Other (Comment)  Housing Interventions Intervention Not Indicated -- --  Transportation Interventions Intervention Not Indicated Other (Comment) Other (Comment)  Financial Strain Interventions Intervention Not Indicated -- --  Physical Activity Interventions Intervention Not Indicated -- --  Stress Interventions Intervention Not Indicated -- --  Social Connections Interventions Intervention Not Indicated -- --      SDOH Screenings   Food Insecurity: No Food Insecurity (10/25/2022)  Housing: Low Risk  (10/25/2022)  Transportation Needs: No Transportation Needs (10/25/2022)  Alcohol Screen: Low Risk  (10/06/2020)  Depression (PHQ2-9): Low Risk  (10/25/2022)  Financial Resource Strain: Low Risk  (10/25/2022)  Physical Activity: Inactive (10/25/2022)  Social Connections: Moderately Isolated (10/25/2022)  Stress: No Stress Concern Present (10/25/2022)  Tobacco Use: Low Risk  (10/25/2022)    Medication Assistance:  Repathaobtained through [place] medication assistance program.  Enrollment ends 11/22/23  Medication Access: Within the past 30 days, how often has patient missed a dose of medication? 0 Is a pillbox or other method used to improve adherence? Yes  Factors that may affect medication adherence? no barriers identified Are meds synced by current pharmacy? No  Are meds delivered by current pharmacy? No  Does patient experience delays in picking up medications due to transportation concerns? No   [facility] Reviewed: Is patient disadvantaged to use [facility]?: Yes  Current Rx insurance plan: [employer] Name and location of Current pharmacy:   [facility] [address] [place] - [place] [address] [place] [address] Phone: [phone] Fax: [place] [address] Phone: [phone] Fax: [phone]  [place], [place] [address] Phone: [phone] Fax: [phone]  [facility] services reviewed with patient today?: Yes  Patient requests to transfer care to [facility]?: No  Reason patient declined to change pharmacies: Loyalty to other pharmacy/Patient preference  Compliance/Adherence/Medication fill history: Care Gaps: None identified  Star-Rating Drugs: N/A   Assessment/Plan      Hypertension (BP goal <140/90) 12/14/22 -Controlled, much improved from our last visit -Current treatment: Amlodipine 10 mg daily Appropriate, Effective, Safe, Accessible Metoprolol '25mg'$  prn elevated HRM Appropriate, Effective, Safe, Accessible -Denies issues with potassium tolerability, able to take 20 meq tablets 3x/day without a problem.   -Current home readings: 129/91, 164/105, 129/91 -Current dietary habits: no specific diet. 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 Admits to not doing the best with hydradtion. -Denies hypotensive/hypertensive symptoms -Educated on BP goals and benefits of medications for prevention of heart attack, stroke and kidney damage; -BP has improved as he works to control his pain caused by nerves in his gut, rectum.  BP rises with bowel movements as this is when he is in pain.  Takes miralax - says stools are normal. Denies constipation. Continue current BP meds, at this time I see no reason to make changes. Keep monitoring and recording BP.   Update 09/07/21 Reports BP has been controlled, pulse has been "up and down."  No specific logs at this time.  He denies any symptoms such as dizziness or HA's. He is now taking his chlorthalidone in the morning and he tolerates well. Encouraged to maintain hydration and continue medications adherence.  He continues to be very active around his house.  Continue current meds at this time.  Update 03/23/22 He reports having BP episodes that are very irregular.  Reports some systolic BP in the 350K where he is having episodes with what sounds like bowel spasms.  Other days his BP is completely normal.  BP has been normal in the office.  Saw GI and no clear explanation of why he is having these GI symptoms.  ER precautions discussed. He is going to monitor BP and record and we will call him in 2-3 weeks to check in.  No changes at this time.  Hyperlipidemia: (LDL goal < 70) -Not ideally controlled per 03/2021 labs  -Repatha start 01/2021 -Current treatment: Repatha 140 mg injection every 14 days (01/2021) Appropriate, Query effective, ,  -Medications previously tried: statin intolerant.  -Reports that everything is taken care of from a cost standpoint through lipid clinic.  -Continues to tolerate well.  He has not had an updated lipid panel in about 2 years as last was May 2022.  He is due for physical with PCP. Counseled on scheduling appointment for udpated labs - patient agrees.  Atrial Fibrillation (Goal: prevent stroke and major bleeding) -Controlled -Current treatment: Rate control: Metoprolol succinate 25 mg prn for HR > 120  Dofetilide 289mg Appropriate, Effective, Safe, Accessible Anticoagulation:  Eliquis 5 mg twice daily Appropriate, Effective, Safe, Accessible -Denies costs concerns with Eliquis, currently about $40 dollars/month. Not wanting to pursue patient assistance at this time -Home  BP and HR readings: n/a  -Counseled on increased risk of stroke due to Afib and benefits of anticoagulation for stroke prevention; importance of adherence to  anticoagulant exactly as prescribed; -Recommended to continue current medication, again emphasized adequate water intake.  Update 09/07/21 Does mention cost burden with Eliquis at this time.  He has not previously applied for patient assistance and is interested at this time in an application.  I discussed the OOP spend with him and the requirements for approval.  Will have application sent to his house for completion. Denies abnormal bleeding/bruising.  He is requesting a new hematologist so will reach out to PCP for referral.  Continue current meds  Update 03/23/22 Recently started Tikosyn and doing well on it.  Reports he is very happy with how he feels since starting this medication.  Working on controlling BP.  HR appears to be controlled.  Denies any concern with cost at this time. No changes will continue to follow BP.  Patient Goals/Self-Care Activities Patient will:  - take medications as prescribed  Follow Up Plan: [facility] telephone f/u 4-6 months Medication Assistance: None required.  Patient affirms current coverage meets needs.  Patient's preferred pharmacy is:  [facility] [address], [place] - [address] [place] [place] [address] Phone: [phone] Fax: [phone]  Follow Up:  Patient agrees to Care Plan and Follow-up.         Beverly Milch, PharmD Clinical Pharmacist  [facility] [phone]

## 2022-12-13 ENCOUNTER — Telehealth: Payer: Self-pay | Admitting: Pharmacist

## 2022-12-13 NOTE — Progress Notes (Signed)
Care Management & Coordination Services Pharmacy Team  Reason for Encounter: Appointment Reminder  Unsuccessful attempt to reach patient. Left detailed message with appointment information.  Patient contacted to confirm telephone appointment with Leata Mouse PharmD, on 12/14/2022 at 12 pm.  Star Rating Drugs:  None  Care Gaps: Annual wellness visit in last year? Yes   Future Appointments  Date Time Provider [place]  12/14/2022 12:00 PM Edythe Clarity, [place] None  02/16/2023  9:00 AM Allwardt, Nicholes Rough [facility]   April D Calhoun, [place] Pharmacist Assistant [phone]

## 2022-12-14 ENCOUNTER — Ambulatory Visit: Payer: Medicare PPO | Admitting: Pharmacist

## 2022-12-14 LAB — LAB REPORT - SCANNED

## 2022-12-16 ENCOUNTER — Telehealth: Payer: Self-pay | Admitting: Cardiology

## 2022-12-16 NOTE — Telephone Encounter (Signed)
Patient states his doctor at [facility] advised him to follow up ASAP due to 1/23 echo results.

## 2022-12-16 NOTE — Telephone Encounter (Signed)
Patient stated he was told to see cardiology ASAP. Made patient first available appointment with APP.

## 2022-12-17 ENCOUNTER — Other Ambulatory Visit: Payer: Self-pay | Admitting: Physician Assistant

## 2022-12-17 ENCOUNTER — Telehealth: Payer: Self-pay | Admitting: Physician Assistant

## 2022-12-17 ENCOUNTER — Other Ambulatory Visit: Payer: Self-pay | Admitting: *Deleted

## 2022-12-17 ENCOUNTER — Ambulatory Visit: Payer: Medicare PPO | Attending: Physician Assistant | Admitting: Physician Assistant

## 2022-12-17 ENCOUNTER — Encounter: Payer: Self-pay | Admitting: Physician Assistant

## 2022-12-17 VITALS — BP 132/80 | HR 159 | Ht 68.0 in | Wt 210.0 lb

## 2022-12-17 DIAGNOSIS — N1831 Chronic kidney disease, stage 3a: Secondary | ICD-10-CM

## 2022-12-17 DIAGNOSIS — I429 Cardiomyopathy, unspecified: Secondary | ICD-10-CM

## 2022-12-17 DIAGNOSIS — I4510 Unspecified right bundle-branch block: Secondary | ICD-10-CM | POA: Diagnosis not present

## 2022-12-17 DIAGNOSIS — Z79899 Other long term (current) drug therapy: Secondary | ICD-10-CM

## 2022-12-17 DIAGNOSIS — I4892 Unspecified atrial flutter: Secondary | ICD-10-CM

## 2022-12-17 DIAGNOSIS — I779 Disorder of arteries and arterioles, unspecified: Secondary | ICD-10-CM

## 2022-12-17 DIAGNOSIS — I4819 Other persistent atrial fibrillation: Secondary | ICD-10-CM | POA: Diagnosis not present

## 2022-12-17 DIAGNOSIS — I10 Essential (primary) hypertension: Secondary | ICD-10-CM | POA: Diagnosis not present

## 2022-12-17 LAB — CBC

## 2022-12-17 LAB — BASIC METABOLIC PANEL
BUN/Creatinine Ratio: 9 — ABNORMAL LOW (ref 10–24)
BUN: 13 mg/dL (ref 8–27)
CO2: 28 mmol/L (ref 20–29)
Calcium: 9.3 mg/dL (ref 8.6–10.2)
Chloride: 102 mmol/L (ref 96–106)
Creatinine, Ser: 1.51 mg/dL — ABNORMAL HIGH (ref 0.76–1.27)
Glucose: 97 mg/dL (ref 70–99)
Potassium: 4.3 mmol/L (ref 3.5–5.2)
Sodium: 138 mmol/L (ref 134–144)
eGFR: 47 mL/min/{1.73_m2} — ABNORMAL LOW (ref >59)

## 2022-12-17 LAB — MAGNESIUM: Magnesium: 2 mg/dL (ref 1.6–2.3)

## 2022-12-17 MED ORDER — METOPROLOL TARTRATE 25 MG PO TABS: 25.0000 mg | ORAL_TABLET | Freq: Two times a day (BID) | ORAL | 3 refills | Status: AC

## 2022-12-17 MED ORDER — AMLODIPINE BESYLATE 10 MG PO TABS: 5.0000 mg | ORAL_TABLET | Freq: Every day | ORAL | 0 refills | Status: DC

## 2022-12-17 MED ORDER — SACUBITRIL-VALSARTAN 24-26 MG PO TABS: 1.0000 | ORAL_TABLET | Freq: Two times a day (BID) | ORAL | 1 refills | Status: DC

## 2022-12-17 NOTE — Telephone Encounter (Addendum)
    After today's visit we obtained receipt of the patient's faxed EKGs from his [facility] ER visit 12/15/22. At 17:52 he was in NSR with PACs, then 19:17 he was in afib RVR 115bpm. Therefore I suspect his arrhythmia is paroxysmal in nature rather than persistent so do not feel TEE/DCCV would be as helpful because he is reverting in and out on his own. Nevertheless, given the frequently elevated HRs, this still may be contributing to his cardiology. I discussed with Dr. [name], the patient's primary cardiologist, who agrees that arrhythmia is likely paroxysmal in nature. He relays that the patient's rhythm issues have been challenging to control over the last few years. Labs show mild uptrend in creatinine but generally stable within prior values going back many years. Electrolytes were OK. Per our discussion, recommend the following change in plan:  - continue plan for Tikosyn at present dose and increase in metoprolol as we discussed at today's visit - cancel TEE/DCCV and nurse EKG visit - move up EP follow-up to next available clinic visit, OK to see APP - has seen both Renee and Jonni Sanger in the past - please clarify patient's intolerances to losartan and olmesartan. If he confirms there was no true allergy and is willing to try a cousin of these medicines, would start Entresto 24/'26mg'$  BID, decrease amlodipine to '5mg'$  daily (would be hopeful to get him off this in the future but do not want to abruptly stop given recent issues with HTN) - check BMET 1 week with med change - needs gen cards f/u in 2 weeks with either Dr. Acie Fredrickson or APP - patient did not wish to schedule follow-up at checkout but Dr. [name] strongly advised to do so - please have him check to see how he was taking Lasix (furosemide) and potassium at home as he was not entirely sure, please let me know  Thank you!

## 2022-12-17 NOTE — Telephone Encounter (Addendum)
Spoke with patient and informed him of message below per Melina Copa, PA-C. He is aware that he will continue plan for Tikosyn and increase in metoprolol. I have cancelled TEE/DCCV, nurse visit and lab. Sent message to [facility] to move up EP/APP appointment. He doesn't remember how he felt when he took losartan and olmesartan but did not believe it to be a true allergy and he is willing to try Entresto 24/26 twice daily and decrease amlodipine to 5 mg daily. He will come in for a BMET on 12/24/22. Have scheduled 2 week follow up for 12/31/22 with Nicholes Rough, PA-C. He informs me that he is not taking furosemide or potassium, these have been removed from his med list per [facility]. He is aware that he will receive a call next week regarding EP/APP follow up appointment. He verbalized his understanding of this information and agrees with this plan.

## 2022-12-17 NOTE — Progress Notes (Addendum)
Cardiology Office Note    Date:  12/17/2022   ID:  Patrick Roth, DOB October 09, 1946, MRN [id number]  PCP:  Patrick Lathe, PA-C  Cardiologist:  Patrick Moores, MD  Electrophysiologist:  Patrick Haw, MD   Chief Complaint: f/u abnormal echo at PCP  History of Present Illness:   Patrick Roth is a 77 y.o. male with history of PAF/flutter on Tikosyn managed by EP, HTN, recurrent CVA, syncope related to orthostasis in 2017, medication nonadherence, polycythema, RBBB, mild carotid artery plaque by duplex 2017, CKD 3a by labs who is seen for post-ER follow-up.  He has significant arrhythmia hx well outlined in EP notes with numerous prior issues with PAF. He did not tolerate amiodarone due to feeling bad/cold/lowered pulse and has been on Tikosyn since 2022. His last DCCV was in 06/2022. He was last seen in clinic 06/2022 with elevated HR back in atrial flutter - per Renee's note "He remains very happy with his AFib burden, I am not as convinced, but for now, will continue" - patient was supposed to f/u closely within a week at that time but he did not.  Additional prior testing reviewed. Renal artery duplex 2013 did not well visualize the L renal artery but otherwise no evidence of RAS. ABIs 2017 were normal. ETT 01/2018 showed poor exercise tolerance but negative for ischemic EKG changes. Nuclear stress test in 02/2018 was normal. He has no known hx of CAD but many ER encounters with negative troponins. He has worn multiple monitors over the years, the last of which in 09/2021 showed predominantly NSR, 1% PVCs, 6.2% PACs, multiple SVT episodes all <20 seconds. Last echo 06/2022 EF 60-65%, mild LVH, normal RV, mod LAE, aortic sclerosis without stenosis. He also has history of significant GI transit symptoms related to hiatal hernia as well as some sort of neuropathic issue. He will apparently be seeing a specialist in neurology at [facility] next month for this since he states GI was unable to discern  exactly what was going on.   He has since been seen back in the [facility] 12/15/22. Per [facility] notes, he'd seen his PCP and had echo showing EF 25-30% so was sent to the ER for evaluation. Labs felt c/w prior, CXR clear, BP 197/117, EKG with "undetermined rhythm." EKG not available at this time. BNP was not drawn. ER doctor spoke with PCP office and it's not clear why the patient was sent to the emergency room. He was discharged and advised to f/u with cardiology. We obtained copy of echo report done 12/15/22 with EF 25-30%, global hypokinetic wall motion, G1DD, unable to exclude RWMA, mild LAE, mild RAE, moderate-severely dilated RV, severely reduced RVSF, mild TR, mild pulm HTN.  He returns to clinic today and intake vitals show HR of 159bpm, RBBB, SVT versus atrial fib/flutter He reports that nearly daily he always feels his heart racing until he has the chance to finally get to the bathroom and have a BM. However, despite daily sx, reports this is not nearly as dramatic or bothersome as it was before he got on Tikosyn. He has not missed any recent doses of this but did miss a few days of Eliquis last week due to shipment delay (last missed dose on Monday 12/13/22). He is unsure if he is taking Lasix or potassium. He denies any recent CP, SOB, edema, orthopnea or weight gain. No syncope. Upon examination his HR sounded slower so repeat EKG obtained with HR 121bpm with RBBB,  more obvious atrial fib. His HR trends down to the low 100s when sitting up, but still afib. He is well appearing and denies any acute complaints presently. Weight is down 3lb.    Labwork independently reviewed: OSH 12/15/22 trop neg, Hgb 17.4, plt 166, K 4.7, Cr 1.43 (c/w prior in 10/2022), TBili 1.64, AST ALT OK, GFR 50, INR 1.3, Mg 1.9 06/2022 TSH wnl 03/2021 LDL 83, trig 87  Past History   Past Medical History:  Diagnosis Date   Atrial fibrillation/flutter    Intol of amio, beta blocker // Echo 12/22: inf HK, EF 50-55,  normal RVSF, mild LaE, mild MR, mod MAC, AV sclerosis w/o aS   Colon polyps    Constipation 05/27/2015   DOE (dyspnea on exertion) 10/13/2015   Faintness 06/20/2016   History of chicken pox    Hyperlipidemia    Hypertension    Internal hemorrhoids    Polycythemia    Sciatica    Stroke ([place])    Hx of stroke in2008; recurrent CVA in 12/2020   Syncope and collapse 06/28/2016    Past Surgical History:  Procedure Laterality Date   CARDIOVERSION N/A 09/02/2017   Procedure: CARDIOVERSION;  Surgeon: [name], MD;  Location: [facility];  Service: Cardiovascular;  Laterality: N/A;   CARDIOVERSION N/A 07/06/2022   Procedure: CARDIOVERSION;  Surgeon: Freada Bergeron, MD;  Location: [facility] ENDOSCOPY;  Service: Cardiovascular;  Laterality: N/A;   COLONOSCOPY  2011   great toe surgery     PROSTATE BIOPSY      Current Medications: Current Meds  Medication Sig   amLODipine (NORVASC) 10 MG tablet Take 10 mg by mouth daily.   apixaban (ELIQUIS) 5 MG TABS tablet TAKE 1 TABLET TWICE DAILY   COMIRNATY SUSP injection Inject into the muscle.   dicyclomine (BENTYL) 20 MG tablet Take 20 mg by mouth every 6 (six) hours as needed.   dofetilide (TIKOSYN) 250 MCG capsule Take 1 capsule by mouth twice daily   famotidine (PEPCID) 20 MG tablet Take 20 mg by mouth daily.   furosemide (LASIX) 20 MG tablet Take 1 tablet (20 mg total) by mouth daily.   lansoprazole (PREVACID) 30 MG capsule Take 30 mg by mouth daily.   metoprolol tartrate (LOPRESSOR) 25 MG tablet Take 25 mg by mouth as needed (if Heart right goes above 125).   oxyCODONE-acetaminophen (PERCOCET/ROXICET) 5-325 MG tablet Take 1 tablet by mouth every 8 (eight) hours as needed for moderate pain.   polyethylene glycol powder (GLYCOLAX/MIRALAX) 17 GM/SCOOP powder Take 17 g by mouth in the morning and at bedtime. Start with 1 scoop 2 times per day until bowels are moving, then reduce to 1 scoop daily. Drink 64oz of water daily. (Patient taking  differently: Take 17 g by mouth daily as needed for mild constipation.)   potassium chloride SA (KLOR-CON M) 20 MEQ tablet TAKE 1 TABLET IN THE MORNING AND TAKE 2 TABLETS IN THE EVENING   pregabalin (LYRICA) 50 MG capsule Take 50 mg by mouth 3 (three) times daily.   REPATHA SURECLICK 494 MG/ML SOAJ INJECT 1 PEN INTO THE SKIN EVERY 14 DAYS.   Vitamin D, Ergocalciferol, (DRISDOL) 1.25 MG (50000 UNIT) CAPS capsule Take 1 capsule (50,000 Units total) by mouth every 7 (seven) days.      Allergies:   Crestor [rosuvastatin calcium], Spironolactone, Terazosin, Amiodarone, Atenolol, Bystolic [nebivolol hcl], Gabapentin, Hydralazine, Tizanidine, Clonidine, Hydrocodone-acetaminophen, Levofloxacin, Losartan, Maxzide [hydrochlorothiazide w-triamterene], Olmesartan, and Tussionex pennkinetic er [hydrocod poli-chlorphe poli er]  Social History   Socioeconomic History   Marital status: Married    Spouse name: [name]   Number of children: 1   Years of education: 12   Highest education level: Not on file  Occupational History   Occupation: Retired  Tobacco Use   Smoking status: Never   Smokeless tobacco: Never   Tobacco comments:    Never smoke 03/11/22  Vaping Use   Vaping Use: Never used  Substance and Sexual Activity   Alcohol use: No   Drug use: No   Sexual activity: Not on file  Other Topics Concern   Not on file  Social History Narrative   Lives with wife   Right Handed   Drinks no caffeine   Social Determinants of Health   Financial Resource Strain: Low Risk  (10/25/2022)   Overall Financial Resource Strain (CARDIA)    Difficulty of Paying Living Expenses: Not hard at all  Food Insecurity: No Food Insecurity (10/25/2022)   Hunger Vital Sign    Worried About Running Out of Food in the Last Year: Never true    Ran Out of Food in the Last Year: Never true  Transportation Needs: No Transportation Needs (10/25/2022)   PRAPARE - Hydrologist (Medical): No     Lack of Transportation (Non-Medical): No  Physical Activity: Inactive (10/25/2022)   Exercise Vital Sign    Days of Exercise per Week: 0 days    Minutes of Exercise per Session: 0 min  Stress: No Stress Concern Present (10/25/2022)   [place]    Feeling of Stress : Not at all  Social Connections: Moderately Isolated (10/25/2022)   Social Connection and Isolation Panel [NHANES]    Frequency of Communication with Friends and Family: More than three times a week    Frequency of Social Gatherings with Friends and Family: More than three times a week    Attends Religious Services: Never    Marine scientist or Organizations: No    Attends Archivist Meetings: Never    Marital Status: Married     Family History:  The patient's family history includes Healthy in his brother and sister; Heart disease in his brother and father; Hypertension (age of onset: 40) in his father; Lung cancer (age of onset: 31) in his mother; Lupus in his daughter; Other in his daughter. There is no history of Diabetes, Heart attack, Hyperlipidemia, or Sudden death.  ROS:   Please see the history of present illness. All other systems are reviewed and otherwise negative.    EKG(s)/Additional Testing   EKG:  EKG is ordered today, personally reviewed, demonstrating  1) wide QRS tachycardia suspect SVT vs atrial fib/atrial flutter RBBB 159bpm 2) atrial fib 121bpm RBBB non specific STT changes - QTC reporting out as 568m but in setting of QRS 1457m- per d/w Dr. LaQuentin Orecorrects to [id number] wide QRS factored in  CV Studies: Cardiac studies reviewed are outlined and summarized above. Otherwise please see EMR for full report.  Recent Labs: 07/04/2022: TSH 1.342 09/07/2022: B Natriuretic Peptide 231.3; Magnesium 2.0 09/18/2022: ALT 18; BUN 12; Creatinine, Ser 1.25; Hemoglobin 16.4; Platelets 161; Potassium 4.1; Sodium 139  Recent Lipid  Panel    Component Value Date/Time   CHOL 154 04/13/2021 0933   TRIG 87 04/13/2021 0933   HDL 55 04/13/2021 0933   CHOLHDL 2.8 04/13/2021 0933   CHOLHDL 4.6 01/17/2021 0036   VLDL  11 01/17/2021 0036   LDLCALC 83 04/13/2021 0933    PHYSICAL EXAM:    VS:  BP (!) 142/80   Pulse (!) 159   Ht 5' 8"  (1.727 m)   Wt 210 lb (95.3 kg)   SpO2 98%   BMI 31.93 kg/m   BMI: Body mass index is 31.93 kg/m. Recheck BP 132/80.  GEN: Well nourished, well developed male in no acute distress HEENT: normocephalic, atraumatic Neck: no JVD, carotid bruits, or masses Cardiac: tachycardic, irregular; no murmurs, rubs, or gallops, no edema  Respiratory:  clear to auscultation bilaterally, normal work of breathing GI: soft, nontender, nondistended, + BS MS: no deformity or atrophy Skin: warm and dry, no rash Neuro:  Alert and Oriented x 3, Strength and sensation are intact, follows commands Psych: euthymic mood, full affect  Wt Readings from Last 3 Encounters:  12/17/22 210 lb (95.3 kg)  10/25/22 213 lb (96.6 kg)  09/15/22 213 lb 13.5 oz (97 kg)     ASSESSMENT & PLAN:   ADDENDUM to note below - see f/u phone note from today for updated information. We were able to get a copy of his recent ER EKGs later this morning after his visit. One of the tracings on 12/15/22 did capture NSR with PACs. Therefore, see phone note for change in plan from this original note with cancellation of TEE/DCCV.  1. Newly recognized cardiomyopathy with biventricular failure - etiology of cardiomyopathy not clear at this time but concern for tachy-mediated given frequently elevated HR and arrhythmia. He was back in rapid flutter back in 06/2022 and supposed to follow up to ensure reversion to sinus but had not done so. I discussed case and prior hx with DOD Dr. Quentin Ore. At this time he suggests that we increase the patient's beta blocker (was doing 60m daily PRN sporadically, will increase to 275mBID), continue Tikosyn at  present dose, and set up for TEE/DCCV with EKG visit 1 day before, preferably at a time where he may have had his bowel movement for the day since the patient reports his symptoms often resolve after this. (He would require TEE due to missed Eliquis dose recently.) The patient does not otherwise have an Apple watch or way to track EKG at home. We discussed possible Zio to evaluate for paroxysmal nature of arrhythmia. Dr. LaQuentin Oreelt that proceeding directly to TEE/DCCV would be recommended given the current arrhythmia and drop in LV function. I would like to optimize his medical therapy from a HF standpoint, but this looks like it will be challenging as he has numerous medication allergies/intolerances including spironolactone, hydralazine, HCTZ, triamterene, multiple other BB, losartan, and olmesartan. Therefore we will prioritize his rhythm stabilization first and go from there. Once rhyhm stabilized, can consider updating ischemic evaluation - fortunately no recent issues with CP or SOB. He actually appears euvolemic without any evidence for volume overload. Reviewed new diagnosis and 2g sodium restriction, 2L fluid restriction, daily weights with patient. Medication adherence has been an issue in the past per notes. Advised he is NOT to miss another dose of Eliquis going forward.   2. Persistent atrial fibrillation/atrial flutter - see above for plan. Get stat BMET, Mg today given Tikosyn. Will also get CBC, TSH, free T4. Needs ongoing EP follow-up as he might need alternative AAD given breakthrough on Tikosyn - though pt reports his overall symptom burden had been much improved ever since he started Tikosyn. Shared Decision Making/Informed Consent The risks [stroke, cardiac arrhythmias rarely resulting in  the need for a temporary or permanent pacemaker, skin irritation or burns, esophageal damage, perforation (1:10,000 risk), bleeding, pharyngeal hematoma as well as other potential complications associated  with conscious sedation including aspiration, arrhythmia, respiratory failure and death], benefits (treatment guidance, restoration of normal sinus rhythm, diagnostic support) and alternatives of a transesophageal echocardiogram guided cardioversion were discussed in detail with Mr. Prows and he is willing to proceed.   3. Essential HTN - follow in context above. Initial BP 142/80 with recheck 132/80. Ideally would like to see him on GDMT for his HTN and LV dysfunction but may prove difficult with multiple medication problems as above so will increase metoprolol and continue amlodipine. He will check if he is on Lasix at home. He does report possibly taking potassium but isn't sure. We will check in with him when labs result. Medication adherence has been an issue in the past per notes. Recommended to bring medicines to next visit.  4. CKD stage 3a - recheck labs today.  5. Mild carotid disease in 2017 - did not discuss today given priority of issues above, recommend attention to this on follow-up. Remains on PSCK9i due to prior issues with statin.    Disposition: Plan TEE DCCV as above with EKG visit 1 day prior [facility] for 2 days prior if no availability). F/u with EP APP in 2 weeks. Gen cards f/u also recommended but per checkout, patient did not want to schedule.   Medication Adjustments/Labs and Tests Ordered: Current medicines are reviewed at length with the patient today.  Concerns regarding medicines are outlined above. Medication changes, Labs and Tests ordered today are summarized above and listed in the Patient Instructions accessible in Encounters.   Signed, Charlie Pitter, PA-C  12/17/2022 8:19 AM    [place] Phone: [phone]; Fax: [phone]

## 2022-12-17 NOTE — Telephone Encounter (Signed)
Per d/w Mindy and pt, pt confirmed he is no longer on Lasix or KCl so we removed from med list. K was stable by labs today. We sent his amlodipine to his mail order as 1/2 tablet daily so that they do not continue to mail out '10mg'$  tablets but sent Entresto to local pharmacy to get him started on this.

## 2022-12-17 NOTE — Patient Instructions (Addendum)
Medication Instructions:  1.Increase metoprolol tartrate to 25 mg twice a day 2.Do not miss any doses of Eliquis going forward *If you need a refill on your cardiac medications before your next appointment, please call your pharmacy*   Lab Work: Stat BMET, Stat Mag, BNP, CBC, TSH and FT4:TODAY If you have labs (blood work) drawn today and your tests are completely normal, you will receive your results only by: [place] (if you have MyChart) OR A paper copy in the mail If you have any lab test that is abnormal or we need to change your treatment, we will call you to review the results.   Testing/Procedures:   Dear [name]  You are scheduled for a TEE (Transesophageal Echocardiogram) Guided Cardioversion on Thursday, February 8 with Dr.Hilty 12:00 PM.  Please arrive at the [facility] (Main Entrance A) at [facility]: [place], [place] [address] at 11:00 AM.   DIET:  Nothing to eat or drink after midnight except a sip of water with medications (see medication instructions below)  MEDICATION INSTRUCTIONS: Continue taking your anticoagulant (blood thinner): Apixaban (Eliquis).  You will need to continue this after your procedure until you are told by your provider that it is safe to stop.    LABS: BMET and CBC 12/28/22  FYI:  For your safety, and to allow [place] to monitor your vital signs accurately during the surgery/procedure we request: If you have artificial nails, gel coating, SNS etc, please have those removed prior to your surgery/procedure. Not having the nail coverings /polish removed may result in cancellation or delay of your surgery/procedure.  You must have a responsible person to drive you home and stay in the waiting area during your procedure. Failure to do so could result in cancellation.  Bring your insurance cards.  *Special Note: Every effort is made to have your procedure done on time. Occasionally there are emergencies that occur at  the hospital that may cause delays. Please be patient if a delay does occur.     Follow-Up: At [facility], you and your health needs are our priority.  As part of our continuing mission to provide you with exceptional heart care, we have created designated Provider Care Teams.  These Care Teams include your primary Cardiologist (physician) and Advanced Practice Providers (APPs -  Physician Assistants and Nurse Practitioners) who all work together to provide you with the care you need, when you need it.   Your next appointment:   EKG-Nurse visit 12/28/22 at 2:00 PM BMET and CBC on 12/28/2022 2 weeks with an EP/APP 3-4 weeks with Melina Copa, PA-C or Dr Acie Fredrickson  Other Instructions One of your heart tests showed weakness of the heart muscle this admission. This may make you more susceptible to weight gain from fluid retention, which can lead to symptoms that we call heart failure. Please follow these special instructions:  For patients with congestive heart failure, we give them these special instructions:  1. Follow a low-salt diet - you should be eating no more than 2,'000mg'$  of sodium per day. This does not necessarily just apply to the salt you put on top of prepared food, but the sodium already in food. Processed food, frozen meals, canned goods, deli meat, and bread can have a surprising amount of sodium per serving so be sure to track this daily. 2. Watch your fluid intake. In general, you should not be taking in more than 2 liters of fluid per day (close  to 64 oz of fluid per day). This includes sources of water in foods like soup, coffee, tea, milk, etc. It's important to stay hydrated but NOT to excess. 2. Weigh yourself on the same scale at same time of day and keep a log. 3. Call your doctor: (Anytime you feel any of the following symptoms)  - 3lb weight gain overnight or 5lb within a few days - Shortness of breath, with or without a dry hacking cough  - Swelling in the hands, feet  or stomach  - If you have to sleep on extra pillows at night in order to breathe   IT IS IMPORTANT TO LET YOUR DOCTOR KNOW EARLY ON IF YOU ARE HAVING SYMPTOMS SO WE CAN HELP YOU!

## 2022-12-20 LAB — PRO B NATRIURETIC PEPTIDE: NT-Pro BNP: 576 pg/mL — ABNORMAL HIGH (ref 0–486)

## 2022-12-20 LAB — TSH: TSH: 2.18 u[IU]/mL (ref 0.450–4.500)

## 2022-12-20 LAB — BASIC METABOLIC PANEL
BUN/Creatinine Ratio: 9 — ABNORMAL LOW (ref 10–24)
BUN: 13 mg/dL (ref 8–27)
CO2: 24 mmol/L (ref 20–29)
Calcium: 9.4 mg/dL (ref 8.6–10.2)
Chloride: 103 mmol/L (ref 96–106)
Creatinine, Ser: 1.45 mg/dL — ABNORMAL HIGH (ref 0.76–1.27)
Glucose: 91 mg/dL (ref 70–99)
Potassium: 4.4 mmol/L (ref 3.5–5.2)
Sodium: 141 mmol/L (ref 134–144)
eGFR: 50 mL/min/{1.73_m2} — ABNORMAL LOW (ref >59)

## 2022-12-20 LAB — CBC
Hematocrit: 53.2 % — ABNORMAL HIGH (ref 37.5–51.0)
Hemoglobin: 17.1 g/dL (ref 13.0–17.7)
MCH: 28.1 pg (ref 26.6–33.0)
MCHC: 32.1 g/dL (ref 31.5–35.7)
MCV: 87 fL (ref 79–97)
Platelets: 180 10*3/uL (ref 150–450)
RBC: 6.09 x10E6/uL — ABNORMAL HIGH (ref 4.14–5.80)
RDW: 13.6 % (ref 11.6–15.4)
WBC: 5.1 10*3/uL (ref 3.4–10.8)

## 2022-12-20 LAB — MAGNESIUM: Magnesium: 2.1 mg/dL (ref 1.6–2.3)

## 2022-12-20 LAB — T4, FREE: Free T4: 1.22 ng/dL (ref 0.82–1.77)

## 2022-12-21 ENCOUNTER — Ambulatory Visit (HOSPITAL_BASED_OUTPATIENT_CLINIC_OR_DEPARTMENT_OTHER): Payer: Medicare PPO | Admitting: Family

## 2022-12-22 ENCOUNTER — Telehealth: Payer: Self-pay

## 2022-12-22 MED ORDER — AMLODIPINE BESYLATE 10 MG PO TABS: 10.0000 mg | ORAL_TABLET | Freq: Every day | ORAL | 3 refills | Status: AC

## 2022-12-22 NOTE — Telephone Encounter (Signed)
I spoke with the pt and he does not remember what is allergy was to the meds but he says he remembers at one tome he had a lot of itching but really not sure.. I tried to call [facility] to see what they have listed as his allergy reaction and when but they do not open until 9 am... I will call back.   For now... the pt will take his Amlodipine 10 mg today... he will watch his BP and keep a log for [place] if he can pull out his BP cuff.   He will plan to have labs next week 12/31/22 unless he hears back from [place] after talking with [facility].

## 2022-12-22 NOTE — Telephone Encounter (Addendum)
Patient had indicated to [place] in phone note 1/26 that he did not have a true allergy, there was some question of whether prior losartan/olmesartan "didn't work." Would need to double check with the patient. If he cannot be fully sure that he did not have an allergy to these medicines, we will have to hold off on adding Entresto and return amlodipine to the prior '10mg'$  dose. Unfortunately he is intolerant to most of the medications we use for congestive heart failure (spironolactone, atenolol, bystolic, hydralazine, and question to ARBs) but still experiences very high blood pressure.  If he has not yet started [facility], there would be no need to check BMET as soon as 2/2 - if he denies any true allergy to prior ARBs and is willing to try Entresto, would move BMET out to 1 week after starting this medicine. Please let me know what he says. Thank you.  We really need to expedite his EP follow-up ASAP as I believe the frequent tachycardia is driving his decreased ejection fraction, though cannot be sure there is not underlying blockage to explain the recent drop in EF. I will loop in Dr. Acie Fredrickson and Dr. [name] here to review recent OV and advise whether we need to think about cardiac cath or optimize heart rhythm first, and whether a Zio would be helpful in adjunct to his plan.

## 2022-12-22 NOTE — Telephone Encounter (Signed)
[facility] is requesting clarification for medication sacubitril-valsartan 24-26 mg, stating that their records show the pt was allergy to angiotensin receptor blockers, but pt can not remember what kind of reaction. Should pt take this medication? Please address

## 2022-12-22 NOTE — Telephone Encounter (Signed)
I spoke with the Pharmacist/ Don at [facility] and he does not know the "reaction" he had in the past but it is noted and the pt also told them when he was there yesterday in their Pharmacy.   He will take the Amlodipine 10 mg monitor his BP and have labs next week.

## 2022-12-23 NOTE — Telephone Encounter (Addendum)
Unfortunately has h/o hydralazine intolerance as well. [name], can you find out if he would be willing to start Imdur '30mg'$  daily? Since BP has been high recently, this would be in addition to his current regimen.

## 2022-12-24 ENCOUNTER — Other Ambulatory Visit: Payer: Medicare PPO

## 2022-12-24 NOTE — Telephone Encounter (Signed)
Will route to triage to review med rx request below.

## 2022-12-24 NOTE — Telephone Encounter (Signed)
Left voicemail message to call the clinic.

## 2022-12-27 NOTE — Telephone Encounter (Signed)
Left message to call the clinic.

## 2022-12-28 ENCOUNTER — Other Ambulatory Visit: Payer: Medicare PPO

## 2022-12-28 ENCOUNTER — Ambulatory Visit: Payer: Medicare PPO | Attending: Physician Assistant

## 2022-12-28 ENCOUNTER — Ambulatory Visit: Payer: Medicare PPO

## 2022-12-28 DIAGNOSIS — Z79899 Other long term (current) drug therapy: Secondary | ICD-10-CM

## 2022-12-28 NOTE — Telephone Encounter (Signed)
Left the pt a message to call the office back and ask to speak with a triage nurse, to receive medication recommendations per [facility], as indicated in this message.

## 2022-12-28 NOTE — Telephone Encounter (Signed)
Patient returned RN's call. 

## 2022-12-29 ENCOUNTER — Telehealth: Payer: Self-pay

## 2022-12-29 LAB — BASIC METABOLIC PANEL
BUN/Creatinine Ratio: 8 — ABNORMAL LOW (ref 10–24)
BUN: 11 mg/dL (ref 8–27)
CO2: 26 mmol/L (ref 20–29)
Calcium: 9.2 mg/dL (ref 8.6–10.2)
Chloride: 104 mmol/L (ref 96–106)
Creatinine, Ser: 1.36 mg/dL — ABNORMAL HIGH (ref 0.76–1.27)
Glucose: 79 mg/dL (ref 70–99)
Potassium: 4.1 mmol/L (ref 3.5–5.2)
Sodium: 142 mmol/L (ref 134–144)
eGFR: 54 mL/min/{1.73_m2} — ABNORMAL LOW (ref >59)

## 2022-12-29 MED ORDER — ISOSORBIDE MONONITRATE ER 30 MG PO TB24: 30.0000 mg | ORAL_TABLET | Freq: Every day | ORAL | 11 refills | Status: DC

## 2022-12-29 NOTE — Telephone Encounter (Signed)
Spoke to the patient, explained the medication changes. Verified and sent new prescription to the pharmacy on file. The patient has been notified of the result and verbalized understanding.  All questions (if any) were answered. Gershon Crane, LPN 12/28/35 0:48 AM

## 2022-12-29 NOTE — Addendum Note (Signed)
Addended by: Callie Fielding D on: 12/29/2022 08:45 AM   Modules accepted: Orders

## 2022-12-29 NOTE — Telephone Encounter (Signed)
-----   Message from Charlie Pitter, [place] sent at 12/29/2022  8:05 AM EST ----- Please let pt know BMET stable. The original plan was for him to get this because he was starting Entresto but later determined in follow-up phone note he was unable to start this. At his recent OV we did increase his metoprolol from PRN to '25mg'$  BID. We did try to reach him per 1/31 phone note for additional med changes per Dr. [name] (to add Imdur) but were unable to reach him. Please relay this recommendation when you call him. Keep f/u as planned 2/9 to reassess HR control. See if any availability to expedite EP visit sooner than 2/15. If not, keep that f/u as well.

## 2022-12-30 ENCOUNTER — Encounter (HOSPITAL_COMMUNITY): Payer: Self-pay | Admitting: *Deleted

## 2022-12-30 ENCOUNTER — Ambulatory Visit (HOSPITAL_COMMUNITY): Admit: 2022-12-30 | Payer: Medicare PPO | Admitting: Internal Medicine

## 2022-12-30 ENCOUNTER — Encounter (HOSPITAL_COMMUNITY): Payer: Self-pay

## 2022-12-30 SURGERY — CARDIOVERSION
Anesthesia: General

## 2022-12-30 NOTE — Progress Notes (Signed)
Office Visit    Patient Name: Patrick Roth Date of Encounter: 12/31/2022  PCP:  Allwardt, Alyssa M, [place]  Cardiologist:  Mertie Moores, MD  Advanced Practice Provider:  No care team member to display Electrophysiologist:  [name], MD   HPI    Patrick Roth is a 77 y.o. male with history of PAF/flutter on Tikosyn managed by EP, HTN, recurrent CVA, syncope related to orthostasis in 2017, medication nonadherence, polycythemia, RBBB, mild carotid artery plaque by duplex 2017, CKD 3A by labs presents today for follow-up appointment.  He has significant arrhythmia history well outlined in EP's notes with numerous prior issues with PAF.  He did not tolerate amiodarone due to feeling bad/cold/lowered pulse and has been on Tikosyn since 2022.  His last DCCV was 06/2022.  He was last seen in clinic 06/2022 with elevated heart rate back in atrial flutter per roommates notes.  Patient was supposed to follow-up closely within a week at that time but did not.  Additionally prior testing reviewed.  Renal artery duplex 2013 did not well visualize the left renal artery but otherwise no evidence of RAS.  ABIs 2017 were normal.  PTT 01/2018 showed poor exercise tolerance but negative for ischemia EKG changes.  Nuclear stress test in 02/2018 was normal.  He has no known history of CAD but many ER encounters with negative troponins.  He has worn multiple monitors over the last years, the last of which in 09/2021 showed predominantly normal sinus rhythm, 1% PVCs, 6.2% PACs, multiple SVT episode all less than 20 seconds.  Last echo 06/2022 EF 60 to 65%, mild LVH, normal RV, moderate LAE, aortic sclerosis without stenosis.  He also has history of significant GI transit symptoms related to hiatal hernia as well as some sort of neuropathic issue.  He will apparently be seeing a specialist and neurology at [facility] next month.  He has since been seen back in the [place] ER  12/15/2022.  Per care everywhere notes, he had seen his PCP and had echo showing EF 25 to 30% so she was sent to the ER for evaluation.  BP 197/117.  EKG with "undetermined rhythm".  ER doctor spoke with PC P office and it is not clear why patient was sent to the emergency room.  Discharged and advised to follow-up with cardiology.  Obtain copy of echo report done 12/15/22.   He returned to the office 12/17/2022 and had a heart rate of 159 bpm, RBBB, SVT versus atrial fibrillation/flutter.  Reports that nearly daily he has felt his heart racing until he had the chance to finally go to the bathroom and have a bowel movement.  However despite daily symptoms reports this is not nearly as dramatic or bothersome as it was before he got on Tikosyn.  He has not missed any doses of Tikosyn but did miss a few doses of Eliquis due to a shipment delay.  Unsure if he was taking potassium or Lasix.  Denied chest pain, SOB, edema, orthopnea, or weight gain.  No syncope.  Today, he tells me that he has a hiatal hernia in his throat and this causes palpitations.  He tells me his heart rate goes up with the bathroom due to his nerve in his rectum.  2/23 for a full workup.  His right leg also aches and Chills.  He has had atrial fibrillation all his life.  He got the flu when he was a  kid and his heart rate has been elevated ever since. HR is well controlled today at 85 bpm.  Reports no shortness of breath nor dyspnea on exertion. Reports no chest pain, pressure, or tightness. No edema, orthopnea, PND.      Past Medical History    Past Medical History:  Diagnosis Date   Atrial fibrillation/flutter    Intol of amio, beta blocker // Echo 12/22: inf HK, EF 50-55, normal RVSF, mild LaE, mild MR, mod MAC, AV sclerosis w/o aS   Colon polyps    Constipation 05/27/2015   DOE (dyspnea on exertion) 10/13/2015   Faintness 06/20/2016   History of chicken pox    Hyperlipidemia    Hypertension    Internal hemorrhoids     Polycythemia    Sciatica    Stroke ([place])    Hx of stroke in2008; recurrent CVA in 12/2020   Syncope and collapse 06/28/2016   Past Surgical History:  Procedure Laterality Date   CARDIOVERSION N/A 09/02/2017   Procedure: CARDIOVERSION;  Surgeon: [name], MD;  Location: [facility];  Service: Cardiovascular;  Laterality: N/A;   CARDIOVERSION N/A 07/06/2022   Procedure: CARDIOVERSION;  Surgeon: Freada Bergeron, MD;  Location: [facility] ENDOSCOPY;  Service: Cardiovascular;  Laterality: N/A;   COLONOSCOPY  2011   great toe surgery     PROSTATE BIOPSY      Allergies  Allergies  Allergen Reactions   Crestor [Rosuvastatin Calcium] Shortness Of Breath    Muscle cramps   Spironolactone Shortness Of Breath   Terazosin Shortness Of Breath, Palpitations and Other (See Comments)    Nerve pain, also   Amiodarone Other (See Comments)    Dropped pulse real low to 45-50, and made him feel very cold   Atenolol Other (See Comments)    Drowsiness and "flu sxs"   Bystolic [Nebivolol Hcl] Other (See Comments)    GI issues   Gabapentin Other (See Comments)    "Did not help with pain"   Hydralazine Other (See Comments)    Joint swelling   Lisinopril Other (See Comments)    Unsure of reaction... pt does not remember well but says he did have some reaction and the Pharmacist at [facility] has it noted but no symptom attached to it.Pt vaguely mentions "itching" at one time but unsure if related.     Tizanidine Other (See Comments)    Syncope, Elevated BP/pulse   Clonidine Other (See Comments)    Unknown reaction   Hydrocodone-Acetaminophen Other (See Comments)    Unknown reaction   Levofloxacin Other (See Comments)    Unknown reaction   Losartan Other (See Comments)    Did not work.  Pt states he couldn't take b/c of SE, but can't remember what SE were.   Maxzide [Hydrochlorothiazide W-Triamterene] Other (See Comments)    Does not tolerate potassium-sparing diuretics   Olmesartan Other (See  Comments)    Unknown reaction   Tussionex Pennkinetic Er [Hydrocod Poli-Chlorphe Poli Er] Other (See Comments)    Unknown reaction    EKGs/Labs/Other Studies Reviewed:   The following studies were reviewed today:  Echocardiogram 07/05/22 IMPRESSIONS     1. Left ventricular ejection fraction, by estimation, is 60 to 65%. The  left ventricle has normal function. The left ventricle has no regional  wall motion abnormalities. There is mild left ventricular hypertrophy.  Indeterminate diastolic filling due to  E-A fusion.   2. Right ventricular systolic function is normal. The right ventricular  size is normal. There  is normal pulmonary artery systolic pressure. The  estimated right ventricular systolic pressure is 99991111 mmHg.   3. Left atrial size was moderately dilated.   4. The mitral valve is grossly normal. Trivial mitral valve  regurgitation.   5. The aortic valve is tricuspid. Aortic valve regurgitation is not  visualized. Aortic valve sclerosis is present, with no evidence of aortic  valve stenosis.   6. The inferior vena cava is normal in size with greater than 50%  respiratory variability, suggesting right atrial pressure of 3 mmHg.   Comparison(s): Changes from prior study are noted. 10/31/2021: LVEF  50-55%, inferobasal hypokinesis.   FINDINGS   Left Ventricle: Left ventricular ejection fraction, by estimation, is 60  to 65%. The left ventricle has normal function. The left ventricle has no  regional wall motion abnormalities. Definity contrast agent was given IV  to delineate the left ventricular   endocardial borders. The left ventricular internal cavity size was normal  in size. There is mild left ventricular hypertrophy. Indeterminate  diastolic filling due to E-A fusion.   Right Ventricle: The right ventricular size is normal. No increase in  right ventricular wall thickness. Right ventricular systolic function is  normal. There is normal pulmonary artery  systolic pressure. The tricuspid  regurgitant velocity is 2.65 m/s, and   with an assumed right atrial pressure of 3 mmHg, the estimated right  ventricular systolic pressure is 99991111 mmHg.   Left Atrium: Left atrial size was moderately dilated.   Right Atrium: Right atrial size was normal in size.   Pericardium: There is no evidence of pericardial effusion.   Mitral Valve: The mitral valve is grossly normal. Trivial mitral valve  regurgitation.   Tricuspid Valve: The tricuspid valve is grossly normal. Tricuspid valve  regurgitation is trivial.   Aortic Valve: The aortic valve is tricuspid. Aortic valve regurgitation is  not visualized. Aortic valve sclerosis is present, with no evidence of  aortic valve stenosis.   Pulmonic Valve: The pulmonic valve was not well visualized. Pulmonic valve  regurgitation is not visualized.   Aorta: The aortic root and ascending aorta are structurally normal, with  no evidence of dilitation.   Venous: The inferior vena cava is normal in size with greater than 50%  respiratory variability, suggesting right atrial pressure of 3 mmHg.   IAS/Shunts: No atrial level shunt detected by color flow Doppler   EKG:  EKG is not ordered today.    Recent Labs: 09/07/2022: B Natriuretic Peptide 231.3 09/18/2022: ALT 18 12/17/2022: Hemoglobin 17.1; Magnesium 2.1; Magnesium 2.0; NT-Pro BNP 576; Platelets 180; TSH 2.180 12/28/2022: BUN 11; Creatinine, Ser 1.36; Potassium 4.1; Sodium 142  Recent Lipid Panel    Component Value Date/Time   CHOL 154 04/13/2021 0933   TRIG 87 04/13/2021 0933   HDL 55 04/13/2021 0933   CHOLHDL 2.8 04/13/2021 0933   CHOLHDL 4.6 01/17/2021 0036   VLDL 11 01/17/2021 0036   LDLCALC 83 04/13/2021 0933    Risk Assessment/Calculations:   CHA2DS2-VASc Score = 5   This indicates a 7.2% annual risk of stroke. The patient's score is based upon: CHF History: 0 HTN History: 1 Diabetes History: 0 Stroke History: 2 Vascular Disease  History: 0 Age Score: 2 Gender Score: 0     Home Medications   Current Meds  Medication Sig   amLODipine (NORVASC) 10 MG tablet Take 1 tablet (10 mg total) by mouth daily.   apixaban (ELIQUIS) 5 MG TABS tablet TAKE 1 TABLET TWICE DAILY  COMIRNATY SUSP injection Inject into the muscle.   dicyclomine (BENTYL) 20 MG tablet Take 20 mg by mouth every 6 (six) hours as needed.   dofetilide (TIKOSYN) 250 MCG capsule Take 1 capsule by mouth twice daily   famotidine (PEPCID) 20 MG tablet Take 20 mg by mouth daily.   isosorbide mononitrate (IMDUR) 30 MG 24 hr tablet Take 1 tablet (30 mg total) by mouth daily.   lansoprazole (PREVACID) 30 MG capsule Take 30 mg by mouth daily.   metoprolol tartrate (LOPRESSOR) 25 MG tablet Take 1 tablet (25 mg total) by mouth 2 (two) times daily.   oxyCODONE-acetaminophen (PERCOCET/ROXICET) 5-325 MG tablet Take 1 tablet by mouth every 8 (eight) hours as needed for moderate pain.   polyethylene glycol powder (GLYCOLAX/MIRALAX) 17 GM/SCOOP powder Take 17 g by mouth in the morning and at bedtime. Start with 1 scoop 2 times per day until bowels are moving, then reduce to 1 scoop daily. Drink 64oz of water daily. (Patient taking differently: Take 17 g by mouth daily as needed for mild constipation.)   pregabalin (LYRICA) 50 MG capsule Take 50 mg by mouth 3 (three) times daily.   REPATHA SURECLICK [id number] MG/ML SOAJ INJECT 1 PEN INTO THE SKIN EVERY 14 DAYS.   Vitamin D, Ergocalciferol, (DRISDOL) 1.25 MG (50000 UNIT) CAPS capsule Take 1 capsule (50,000 Units total) by mouth every 7 (seven) days.     Review of Systems      All other systems reviewed and are otherwise negative except as noted above.  Physical Exam    VS:  BP 122/84   Pulse 85   Ht 5' 8"$  (1.727 m)   Wt 211 lb 3.2 oz (95.8 kg)   SpO2 98%   BMI 32.11 kg/m  , BMI Body mass index is 32.11 kg/m.  Wt Readings from Last 3 Encounters:  12/31/22 211 lb 3.2 oz (95.8 kg)  12/17/22 210 lb (95.3 kg)  10/25/22  213 lb (96.6 kg)     GEN: Well nourished, well developed, in no acute distress. HEENT: normal. Neck: Supple, no JVD, carotid bruits, or masses. Cardiac: RRR, no murmurs, rubs, or gallops. No clubbing, cyanosis, R > L LE edema (normal for him).  Radials/PT 2+ and equal bilaterally.  Respiratory:  Respirations regular and unlabored, clear to auscultation bilaterally. GI: Soft, nontender, nondistended. MS: No deformity or atrophy. Skin: Warm and dry, no rash. Neuro:  Strength and sensation are intact. Psych: Normal affect.  Assessment & Plan    Newly recognized cardiomyopathy with biventricular failure -euvolemic at this time -continue current medication regimen -Most recent echocardiogram reviewed with LVEF 60 to 65%.  Right ventricular function is normal  Persistent atrial fibrillation/atrial flutter -NSR today -Continue Eliquis 5 mg twice daily, Tikosyn 250 mg twice daily Lopressor 25 mg twice daily  Hypertension -well controlled today -continue current medication regimen -continue to monitor at home  Mild carotid artery disease 2017 -US neck today to update -no syncope or presyncope         Disposition: Follow up 3 months with Mertie Moores, MD or APP.  Signed, Elgie Collard, PA-C 12/31/2022, 5:28 PM [facility]

## 2022-12-31 ENCOUNTER — Ambulatory Visit: Payer: Medicare PPO | Attending: Physician Assistant | Admitting: Physician Assistant

## 2022-12-31 ENCOUNTER — Ambulatory Visit: Payer: Medicare PPO | Admitting: Physician Assistant

## 2022-12-31 ENCOUNTER — Encounter: Payer: Self-pay | Admitting: Physician Assistant

## 2022-12-31 ENCOUNTER — Other Ambulatory Visit: Payer: Medicare PPO

## 2022-12-31 VITALS — BP 122/84 | HR 85 | Ht 68.0 in | Wt 211.2 lb

## 2022-12-31 DIAGNOSIS — I480 Paroxysmal atrial fibrillation: Secondary | ICD-10-CM | POA: Diagnosis not present

## 2022-12-31 DIAGNOSIS — I429 Cardiomyopathy, unspecified: Secondary | ICD-10-CM

## 2022-12-31 DIAGNOSIS — I779 Disorder of arteries and arterioles, unspecified: Secondary | ICD-10-CM | POA: Diagnosis not present

## 2022-12-31 DIAGNOSIS — I4892 Unspecified atrial flutter: Secondary | ICD-10-CM

## 2022-12-31 DIAGNOSIS — I484 Atypical atrial flutter: Secondary | ICD-10-CM

## 2022-12-31 DIAGNOSIS — I10 Essential (primary) hypertension: Secondary | ICD-10-CM | POA: Diagnosis not present

## 2022-12-31 DIAGNOSIS — N1831 Chronic kidney disease, stage 3a: Secondary | ICD-10-CM

## 2022-12-31 NOTE — Patient Instructions (Signed)
Medication Instructions:  Your physician recommends that you continue on your current medications as directed. Please refer to the Current Medication list given to you today.  *If you need a refill on your cardiac medications before your next appointment, please call your pharmacy*   Lab Work: None ordered If you have labs (blood work) drawn today and your tests are completely normal, you will receive your results only by: [place] (if you have MyChart) OR A paper copy in the mail If you have any lab test that is abnormal or we need to change your treatment, we will call you to review the results.   Testing/Procedures: Your physician has requested that you have a carotid duplex. This test is an ultrasound of the carotid arteries in your neck. It looks at blood flow through these arteries that supply the brain with blood. Allow one hour for this exam. There are no restrictions or special instructions.    Follow-Up: At [facility], you and your health needs are our priority.  As part of our continuing mission to provide you with exceptional heart care, we have created designated Provider Care Teams.  These Care Teams include your primary Cardiologist (physician) and Advanced Practice Providers (APPs -  Physician Assistants and Nurse Practitioners) who all work together to provide you with the care you need, when you need it.   Your next appointment:   3 month(s)  Provider:   Mertie Moores, MD

## 2023-01-03 ENCOUNTER — Ambulatory Visit: Payer: Medicare PPO | Admitting: Physician Assistant

## 2023-01-05 NOTE — Progress Notes (Unsigned)
Cardiology Office Note Date:  01/06/2023  Patient ID:  Patrick Roth, Patrick Roth 1946/08/02, MRN [id number] PCP:  [name], PA-C  Cardiologist:  Mertie Moores, MD Electrophysiologist: Constance Haw, MD    Chief Complaint: Phyllis Ginger follow-up  History of Present Illness: Patrick Roth is a 77 y.o. male with PMH notable for parox Afib, HTN, CVA, syncope, CVA, syncope; seen today for Patrick Meredith Leeds, MD for routine electrophysiology followup.  He last saw PA Charlcie Cradle 07/16/2022, on tikosyn. Had many recent Afib episodes, but was overall happy with AF control so tikosyn continued. GI symptoms seem to precipitate AF episodes. Saw gen cards PA Harriet Pho 12/2022, was doing well, in NSR. An external echo report rec'd after visit. Planning for ischemic eval d/t reduced EF.  Since last being seen in our clinic the patient reports doing pretty well. He continues to have GI complaints and is in process of seeing [facility] provider for this. He believes his AF is well-controlled, no palpitations, CP, pressure. Is sometimes SOB with activity, but goes to the bathroom and SOB subsides.   He is diligent about taking tikosyn BID Taking eliquis BID, no bleeding concerns Checks BP at home, readings are usually 130s/80s. Has a log, but left it at home. Brought it to cards appt earlier this month.     he denies chest pain, palpitations, dyspnea, PND, orthopnea, nausea, vomiting, dizziness, syncope, edema, weight gain, or early satiety.    AAD History: AF diag 2017 Started amiodarone March 2019 >> seems self stopped sometime in the Fall 2021 2/2 making him feel cold and lowered his pulse Toprol was stopped by the patient making him feel bad, poorly tolerated Tikosyn started Dec 2022  Past Medical History:  Diagnosis Date   Atrial fibrillation/flutter    Intol of amio, beta blocker // Echo 12/22: inf HK, EF 50-55, normal RVSF, mild LaE, mild MR, mod MAC, AV sclerosis w/o aS   Colon polyps    Constipation  05/27/2015   DOE (dyspnea on exertion) 10/13/2015   Faintness 06/20/2016   History of chicken pox    Hyperlipidemia    Hypertension    Internal hemorrhoids    Polycythemia    Sciatica    Stroke ([place])    Hx of stroke in2008; recurrent CVA in 12/2020   Syncope and collapse 06/28/2016    Past Surgical History:  Procedure Laterality Date   CARDIOVERSION N/A 09/02/2017   Procedure: CARDIOVERSION;  Surgeon: [name], MD;  Location: [place];  Service: Cardiovascular;  Laterality: N/A;   CARDIOVERSION N/A 07/06/2022   Procedure: CARDIOVERSION;  Surgeon: Freada Bergeron, MD;  Location: [facility] ENDOSCOPY;  Service: Cardiovascular;  Laterality: N/A;   COLONOSCOPY  2011   great toe surgery     PROSTATE BIOPSY      Current Outpatient Medications  Medication Instructions   amiodarone (PACERONE) 400 mg, Oral, 2 times daily, FOR 10 DAYS TAKE 400 MG ONCE A DAY  10 DAYS THEN TAKE 200 MG DAILY   amLODipine (NORVASC) 10 mg, Oral, Daily   COMIRNATY SUSP injection Intramuscular   dicyclomine (BENTYL) 20 mg, Oral, Every 6 hours PRN   Eliquis 5 mg, Oral, 2 times daily   famotidine (PEPCID) 20 mg, Oral, Daily   isosorbide mononitrate (IMDUR) 30 mg, Oral, Daily   lansoprazole (PREVACID) 30 mg, Oral, Daily   metoprolol tartrate (LOPRESSOR) 25 mg, Oral, 2 times daily   oxyCODONE-acetaminophen (PERCOCET/ROXICET) 5-325 MG tablet 1 tablet, Oral, Every 8 hours PRN  polyethylene glycol powder (GLYCOLAX/MIRALAX) 17 g, Oral, 2 times daily, Start with 1 scoop 2 times per day until bowels are moving, then reduce to 1 scoop daily. Drink 64oz of water daily.   pregabalin (LYRICA) 50 mg, Oral, 3 times daily   REPATHA SURECLICK [id number] MG/ML SOAJ INJECT 1 PEN INTO THE SKIN EVERY 14 DAYS.   Vitamin D (Ergocalciferol) (DRISDOL) 50,000 Units, Oral, Every 7 days      Social History:  The patient  reports that he has never smoked. He has never used smokeless tobacco. He reports that he does not drink alcohol  and does not use drugs.   Family History: The patient's family history includes Healthy in his brother and sister; Heart disease in his brother and father; Hypertension (age of onset: 24) in his father; Lung cancer (age of onset: 31) in his mother; Lupus in his daughter; Other in his daughter.  ROS:  Please see the history of present illness. All other systems are reviewed and otherwise negative.   PHYSICAL EXAM:  VS:  BP 138/78   Pulse 79   Ht 5' 8"$  (1.727 m)   Wt 215 lb 6.4 oz (97.7 kg)   SpO2 99%   BMI 32.75 kg/m  BMI: Body mass index is 32.75 kg/m.  GEN- The patient is well appearing, alert and oriented x 3 today.   HEENT: normocephalic, atraumatic; sclera clear, conjunctiva pink; hearing intact; oropharynx clear; neck supple, no JVP Lungs- Clear to ausculation bilaterally, normal work of breathing.  No wheezes, rales, rhonchi Heart- Irregularly irregular rate and rhythm, no murmurs, rubs or gallops, PMI not laterally displaced GI- soft, non-tender, non-distended, bowel sounds present, no hepatosplenomegaly Extremities- 1+ peripheral edema. no clubbing or cyanosis; DP/PT/radial pulses 2+ bilaterally MS- no significant deformity or atrophy Skin- warm and dry, no rash or lesion Psych- euthymic mood, full affect Neuro- strength and sensation are intact   EKG is ordered. Personal review of EKG from today shows:  Aflutter w variable ventricular conduction, rate 80bpm. LAD, RBBB  By my measurement, QT = 427m. QTC = 499 with RBBB; stable from previous  Recent Labs: 09/07/2022: B Natriuretic Peptide 231.3 09/18/2022: ALT 18 12/17/2022: Hemoglobin 17.1; Magnesium 2.1; Magnesium 2.0; NT-Pro BNP 576; Platelets 180; TSH 2.180 12/28/2022: BUN 11; Creatinine, Ser 1.36; Potassium 4.1; Sodium 142  No results found for requested labs within last 365 days.   Estimated Creatinine Clearance: 51.5 mL/min (A) (by C-G formula based on SCr of 1.36 mg/dL (H)).   Wt Readings from Last 3 Encounters:   01/06/23 215 lb 6.4 oz (97.7 kg)  12/31/22 211 lb 3.2 oz (95.8 kg)  12/17/22 210 lb (95.3 kg)     Additional studies reviewed include: Previous EP, cardiology notes.   TTE 12/15/22 (external, scanned into chart)    TTE 07/05/22  1. Left ventricular ejection fraction, by estimation, is 60 to 65%. The left ventricle has normal function. The left ventricle has no regional wall motion abnormalities. There is mild left ventricular hypertrophy. Indeterminate diastolic filling due to  E-A fusion.   2. Right ventricular systolic function is normal. The right ventricular size is normal. There is normal pulmonary artery systolic pressure. The estimated right ventricular systolic pressure is 399991111mmHg.   3. Left atrial size was moderately dilated.   4. The mitral valve is grossly normal. Trivial mitral valve regurgitation.   5. The aortic valve is tricuspid. Aortic valve regurgitation is not visualized. Aortic valve sclerosis is present, with no evidence of aortic valve  stenosis.   6. The inferior vena cava is normal in size with greater than 50% respiratory variability, suggesting right atrial pressure of 3 mmHg.   Comparison(s): Changes from prior study are noted. 10/31/2021: LVEF 50-55%, inferobasal hypokinesis.    ASSESSMENT AND PLAN:  #) parox AF Long discussion with patient about whether to continue tikosyn at this time.  Frequent EKGs in office showing AF. Patient feels well, query whether his ventricular rates being well-controlled, not because he is in NSR. Patient agreeable to stopping tikosyn at this time Patrick amiodarone load beginning 2/17 PM to allow tikosyn wash out - 447m BID x 10d, 4050mdaily x 10d, then 20081maily - LFT today, TSH recently WNL  #) Hypercoag d/t AF CHA2DS2-VASc Score = 5 [CHF History: 0, HTN History: 1, Diabetes History: 0, Stroke History: 2, Vascular Disease History: 0, Age Score: 2, Gender Score: 0].  Therefore, the patient's annual risk of stroke is 7.2  %.    OACUnioneliquis 5mg55mD, appropriately dosed    #) newly diagnosed HFrEF Grossly euvolemic on exam with some BLE edema Good exercise tolerance Seeing gen cards for additional workup soon  #) HTN Well-controlled  Current medicines are reviewed at length with the patient today.   The patient does not have concerns regarding his medicines.  The following changes were made today:   STOP tikosyn START amiodarone  Labs/ tests ordered today include:  Orders Placed This Encounter  Procedures   Hepatic function panel   EKG 12-Lead     Disposition: Follow up with EP APP in in 3 months   Signed, SuzaMamie Levers  01/06/23  12:12 PM  Electrophysiology [facility]

## 2023-01-06 ENCOUNTER — Ambulatory Visit (INDEPENDENT_AMBULATORY_CARE_PROVIDER_SITE_OTHER): Payer: Medicare PPO | Admitting: Cardiology

## 2023-01-06 ENCOUNTER — Encounter: Payer: Self-pay | Admitting: Physician Assistant

## 2023-01-06 ENCOUNTER — Ambulatory Visit
Admission: RE | Admit: 2023-01-06 | Discharge: 2023-01-06 | Disposition: A | Discharge status: Home / Self Care | Payer: Medicare PPO | Source: Ambulatory Visit | Attending: Physician Assistant | Admitting: Physician Assistant

## 2023-01-06 VITALS — BP 138/78 | HR 79 | Ht 68.0 in | Wt 215.4 lb

## 2023-01-06 DIAGNOSIS — I4891 Unspecified atrial fibrillation: Secondary | ICD-10-CM | POA: Insufficient documentation

## 2023-01-06 DIAGNOSIS — I5020 Unspecified systolic (congestive) heart failure: Secondary | ICD-10-CM | POA: Diagnosis present

## 2023-01-06 DIAGNOSIS — I779 Disorder of arteries and arterioles, unspecified: Secondary | ICD-10-CM | POA: Insufficient documentation

## 2023-01-06 DIAGNOSIS — I10 Essential (primary) hypertension: Secondary | ICD-10-CM | POA: Diagnosis present

## 2023-01-06 LAB — HEPATIC FUNCTION PANEL
ALT: 15 IU/L (ref 0–44)
AST: 20 IU/L (ref 0–40)
Albumin: 4.1 g/dL (ref 3.8–4.8)
Alkaline Phosphatase: 91 IU/L (ref 44–121)
Bilirubin Total: 0.6 mg/dL (ref 0.0–1.2)
Bilirubin, Direct: 0.21 mg/dL (ref 0.00–0.40)
Total Protein: 6.8 g/dL (ref 6.0–8.5)

## 2023-01-06 MED ORDER — AMIODARONE HCL 400 MG PO TABS: 400.0000 mg | ORAL_TABLET | Freq: Two times a day (BID) | ORAL | 0 refills | Status: DC

## 2023-01-06 NOTE — Patient Instructions (Addendum)
Medication Instructions:   START TAKING:   AMIODARONE 400 MG  TWICE A DAY FOR 10 DAYS   2. THEN TAKE 400 MG  ONCE A DAY FOR 10 DAYS   3. THEN RESUME TAKING 200 MG ONCE  A DAY    STOP TAKING AND REMOVE THIS MEDICATION FROM YOUR MEDICATION LIST: TIKOSYN     *If you need a refill on your cardiac medications before your next appointment, please call your pharmacy*   Lab Work: [place]   If you have labs (blood work) drawn today and your tests are completely normal, you will receive your results only by: [place] (if you have MyChart) OR A paper copy in the mail If you have any lab test that is abnormal or we need to change your treatment, we will call you to review the results.   Testing/Procedures: NONE ORDERED  TODAY      Follow-Up: At [facility], you and your health needs are our priority.  As part of our continuing mission to provide you with exceptional heart care, we have created designated Provider Care Teams.  These Care Teams include your primary Cardiologist (physician) and Advanced Practice Providers (APPs -  Physician Assistants and Nurse Practitioners) who all work together to provide you with the care you need, when you need it.  We recommend signing up for the patient portal called "MyChart".  Sign up information is provided on this After Visit Summary.  MyChart is used to connect with patients for Virtual Visits (Telemedicine).  Patients are able to view lab/test results, encounter notes, upcoming appointments, etc.  Non-urgent messages can be sent to your provider as well.   To learn more about what you can do with MyChart, go to [email or web address].    Your next appointment:   3 month(s)  Provider:   You may see  one of the following Advanced Practice Providers on your designated Care Team:   Tommye Standard, [place] "Jonni Sanger" [place], [place] Mamie Levers, NP    Other Instructions

## 2023-01-09 NOTE — Progress Notes (Deleted)
Office Visit    Patient Name: Patrick Roth Date of Encounter: 01/09/2023  PCP:  Allwardt, Alyssa M, [place]  Cardiologist:  Mertie Moores, MD  Advanced Practice Provider:  No care team member to display Electrophysiologist:  [name], MD   HPI    Patrick BALLIETT is a 77 y.o. male with history of PAF/flutter on Tikosyn managed by EP, HTN, recurrent CVA, syncope related to orthostasis in 2017, medication nonadherence, polycythemia, RBBB, mild carotid artery plaque by duplex 2017, CKD 3A by labs presents today for follow-up appointment.  He has significant arrhythmia history well outlined in EP's notes with numerous prior issues with PAF.  He did not tolerate amiodarone due to feeling bad/cold/lowered pulse and has been on Tikosyn since 2022.  His last DCCV was 06/2022.  He was last seen in clinic 06/2022 with elevated heart rate back in atrial flutter per roommates notes.  Patient was supposed to follow-up closely within a week at that time but did not.  Additionally prior testing reviewed.  Renal artery duplex 2013 did not well visualize the left renal artery but otherwise no evidence of RAS.  ABIs 2017 were normal.  PTT 01/2018 showed poor exercise tolerance but negative for ischemia EKG changes.  Nuclear stress test in 02/2018 was normal.  He has no known history of CAD but many ER encounters with negative troponins.  He has worn multiple monitors over the last years, the last of which in 09/2021 showed predominantly normal sinus rhythm, 1% PVCs, 6.2% PACs, multiple SVT episode all less than 20 seconds.  Last echo 06/2022 EF 60 to 65%, mild LVH, normal RV, moderate LAE, aortic sclerosis without stenosis.  He also has history of significant GI transit symptoms related to hiatal hernia as well as some sort of neuropathic issue.  He will apparently be seeing a specialist and neurology at [facility] next month.  He has since been seen back in the [place] ER  12/15/2022.  Per care everywhere notes, he had seen his PCP and had echo showing EF 25 to 30% so she was sent to the ER for evaluation.  BP 197/117.  EKG with "undetermined rhythm".  ER doctor spoke with PC P office and it is not clear why patient was sent to the emergency room.  Discharged and advised to follow-up with cardiology.  Obtain copy of echo report done 12/15/22.   He returned to the office 12/17/2022 and had a heart rate of 159 bpm, RBBB, SVT versus atrial fibrillation/flutter.  Reports that nearly daily he has felt his heart racing until he had the chance to finally go to the bathroom and have a bowel movement.  However despite daily symptoms reports this is not nearly as dramatic or bothersome as it was before he got on Tikosyn.  He has not missed any doses of Tikosyn but did miss a few doses of Eliquis due to a shipment delay.  Unsure if he was taking potassium or Lasix.  Denied chest pain, SOB, edema, orthopnea, or weight gain.  No syncope.  Today, he tells me that he has a hiatal hernia in his throat and this causes palpitations.  He tells me his heart rate goes up with the bathroom due to his nerve in his rectum.  2/23 for a full workup.  His right leg also aches and Chills.  He has had atrial fibrillation all his life.  He got the flu when he was a  kid and his heart rate has been elevated ever since. HR is well controlled today at 85 bpm.  Reports no shortness of breath nor dyspnea on exertion. Reports no chest pain, pressure, or tightness. No edema, orthopnea, PND.      Past Medical History    Past Medical History:  Diagnosis Date   Atrial fibrillation/flutter    Intol of amio, beta blocker // Echo 12/22: inf HK, EF 50-55, normal RVSF, mild LaE, mild MR, mod MAC, AV sclerosis w/o aS   Colon polyps    Constipation 05/27/2015   DOE (dyspnea on exertion) 10/13/2015   Faintness 06/20/2016   History of chicken pox    Hyperlipidemia    Hypertension    Internal hemorrhoids     Polycythemia    Sciatica    Stroke ([place])    Hx of stroke in2008; recurrent CVA in 12/2020   Syncope and collapse 06/28/2016   Past Surgical History:  Procedure Laterality Date   CARDIOVERSION N/A 09/02/2017   Procedure: CARDIOVERSION;  Surgeon: [name], MD;  Location: [place];  Service: Cardiovascular;  Laterality: N/A;   CARDIOVERSION N/A 07/06/2022   Procedure: CARDIOVERSION;  Surgeon: Freada Bergeron, MD;  Location: [facility] ENDOSCOPY;  Service: Cardiovascular;  Laterality: N/A;   COLONOSCOPY  2011   great toe surgery     PROSTATE BIOPSY      Allergies  Allergies  Allergen Reactions   Crestor [Rosuvastatin Calcium] Shortness Of Breath    Muscle cramps   Spironolactone Shortness Of Breath   Terazosin Shortness Of Breath, Palpitations and Other (See Comments)    Nerve pain, also   Amiodarone Other (See Comments)    Dropped pulse real low to 45-50, and made him feel very cold   Atenolol Other (See Comments)    Drowsiness and "flu sxs"   Bystolic [Nebivolol Hcl] Other (See Comments)    GI issues   Gabapentin Other (See Comments)    "Did not help with pain"   Hydralazine Other (See Comments)    Joint swelling   Lisinopril Other (See Comments)    Unsure of reaction... pt does not remember well but says he did have some reaction and the Pharmacist at [facility] has it noted but no symptom attached to it.Pt vaguely mentions "itching" at one time but unsure if related.     Tizanidine Other (See Comments)    Syncope, Elevated BP/pulse   Clonidine Other (See Comments)    Unknown reaction   Hydrocodone-Acetaminophen Other (See Comments)    Unknown reaction   Levofloxacin Other (See Comments)    Unknown reaction   Losartan Other (See Comments)    Did not work.  Pt states he couldn't take b/c of SE, but can't remember what SE were.   Maxzide [Hydrochlorothiazide W-Triamterene] Other (See Comments)    Does not tolerate potassium-sparing diuretics   Olmesartan Other (See  Comments)    Unknown reaction   Tussionex Pennkinetic Er [Hydrocod Poli-Chlorphe Poli Er] Other (See Comments)    Unknown reaction    EKGs/Labs/Other Studies Reviewed:   The following studies were reviewed today:  Echocardiogram 07/05/22 IMPRESSIONS     1. Left ventricular ejection fraction, by estimation, is 60 to 65%. The  left ventricle has normal function. The left ventricle has no regional  wall motion abnormalities. There is mild left ventricular hypertrophy.  Indeterminate diastolic filling due to  E-A fusion.   2. Right ventricular systolic function is normal. The right ventricular  size is normal. There  is normal pulmonary artery systolic pressure. The  estimated right ventricular systolic pressure is 99991111 mmHg.   3. Left atrial size was moderately dilated.   4. The mitral valve is grossly normal. Trivial mitral valve  regurgitation.   5. The aortic valve is tricuspid. Aortic valve regurgitation is not  visualized. Aortic valve sclerosis is present, with no evidence of aortic  valve stenosis.   6. The inferior vena cava is normal in size with greater than 50%  respiratory variability, suggesting right atrial pressure of 3 mmHg.   Comparison(s): Changes from prior study are noted. 10/31/2021: LVEF  50-55%, inferobasal hypokinesis.   FINDINGS   Left Ventricle: Left ventricular ejection fraction, by estimation, is 60  to 65%. The left ventricle has normal function. The left ventricle has no  regional wall motion abnormalities. Definity contrast agent was given IV  to delineate the left ventricular   endocardial borders. The left ventricular internal cavity size was normal  in size. There is mild left ventricular hypertrophy. Indeterminate  diastolic filling due to E-A fusion.   Right Ventricle: The right ventricular size is normal. No increase in  right ventricular wall thickness. Right ventricular systolic function is  normal. There is normal pulmonary artery  systolic pressure. The tricuspid  regurgitant velocity is 2.65 m/s, and   with an assumed right atrial pressure of 3 mmHg, the estimated right  ventricular systolic pressure is 99991111 mmHg.   Left Atrium: Left atrial size was moderately dilated.   Right Atrium: Right atrial size was normal in size.   Pericardium: There is no evidence of pericardial effusion.   Mitral Valve: The mitral valve is grossly normal. Trivial mitral valve  regurgitation.   Tricuspid Valve: The tricuspid valve is grossly normal. Tricuspid valve  regurgitation is trivial.   Aortic Valve: The aortic valve is tricuspid. Aortic valve regurgitation is  not visualized. Aortic valve sclerosis is present, with no evidence of  aortic valve stenosis.   Pulmonic Valve: The pulmonic valve was not well visualized. Pulmonic valve  regurgitation is not visualized.   Aorta: The aortic root and ascending aorta are structurally normal, with  no evidence of dilitation.   Venous: The inferior vena cava is normal in size with greater than 50%  respiratory variability, suggesting right atrial pressure of 3 mmHg.   IAS/Shunts: No atrial level shunt detected by color flow Doppler   EKG:  EKG is not ordered today.    Recent Labs: 09/07/2022: B Natriuretic Peptide 231.3 12/17/2022: Hemoglobin 17.1; Magnesium 2.1; Magnesium 2.0; NT-Pro BNP 576; Platelets 180; TSH 2.180 12/28/2022: BUN 11; Creatinine, Ser 1.36; Potassium 4.1; Sodium 142 01/06/2023: ALT 15  Recent Lipid Panel    Component Value Date/Time   CHOL 154 04/13/2021 0933   TRIG 87 04/13/2021 0933   HDL 55 04/13/2021 0933   CHOLHDL 2.8 04/13/2021 0933   CHOLHDL 4.6 01/17/2021 0036   VLDL 11 01/17/2021 0036   LDLCALC 83 04/13/2021 0933    Risk Assessment/Calculations:   CHA2DS2-VASc Score = 5   This indicates a 7.2% annual risk of stroke. The patient's score is based upon: CHF History: 0 HTN History: 1 Diabetes History: 0 Stroke History: 2 Vascular Disease  History: 0 Age Score: 2 Gender Score: 0     Home Medications   No outpatient medications have been marked as taking for the 01/10/23 encounter (Appointment) with Elgie Collard, PA-C.     Review of Systems      All other systems reviewed and  are otherwise negative except as noted above.  Physical Exam    VS:  There were no vitals taken for this visit. , BMI There is no height or weight on file to calculate BMI.  Wt Readings from Last 3 Encounters:  01/06/23 215 lb 6.4 oz (97.7 kg)  12/31/22 211 lb 3.2 oz (95.8 kg)  12/17/22 210 lb (95.3 kg)     GEN: Well nourished, well developed, in no acute distress. HEENT: normal. Neck: Supple, no JVD, carotid bruits, or masses. Cardiac: RRR, no murmurs, rubs, or gallops. No clubbing, cyanosis, R > L LE edema (normal for him).  Radials/PT 2+ and equal bilaterally.  Respiratory:  Respirations regular and unlabored, clear to auscultation bilaterally. GI: Soft, nontender, nondistended. MS: No deformity or atrophy. Skin: Warm and dry, no rash. Neuro:  Strength and sensation are intact. Psych: Normal affect.  Assessment & Plan    Newly recognized cardiomyopathy with biventricular failure -euvolemic at this time -continue current medication regimen -Most recent echocardiogram reviewed with LVEF 60 to 65%.  Right ventricular function is normal  Persistent atrial fibrillation/atrial flutter -NSR today -Continue Eliquis 5 mg twice daily, Tikosyn 250 mg twice daily Lopressor 25 mg twice daily  Hypertension -well controlled today -continue current medication regimen -continue to monitor at home  Mild carotid artery disease 2017 -US neck today to update -no syncope or presyncope  No BP recorded.  {Refresh Note OR Click here to enter BP  :1}***      Disposition: Follow up 3 months with Mertie Moores, MD or APP.  Signed, Elgie Collard, PA-C 01/09/2023, 6:03 PM [facility]

## 2023-01-10 ENCOUNTER — Telehealth: Payer: Self-pay | Admitting: Physician Assistant

## 2023-01-10 ENCOUNTER — Ambulatory Visit: Payer: Medicare PPO | Admitting: Cardiovascular Disease

## 2023-01-10 ENCOUNTER — Ambulatory Visit: Payer: Medicare PPO | Admitting: Physician Assistant

## 2023-01-10 NOTE — Telephone Encounter (Addendum)
These medication changes were advised after OV with me 1/26 but before f/u OV with Tessa 12/31/22. Patient refusal noted. Will forward to [facility] as FYI. Addendum: Pt had appointment today with Tessa but no showed. Needs to be offered appt to r/s.  Addendum: I will open a new phone note today so that we can track the med conversation here to avoid this getting buried before more updated visits.

## 2023-01-10 NOTE — Telephone Encounter (Addendum)
I received updated triage msg this AM listed on phone note 12/22/22 from [place] but am moving the conversation to this phone note to avoid it getting buried beneath more recent OVs with Tessa 12/31/22 and Suzann 01/06/23. Per Altha Harm, "Spoke with pt and after reviewing potential side effects never started Amlodipine Pt feels B/P issues maybe coming from hernia Refuses to start any new meds until has f/u at [facility] pt the reason for call was to see if would start Imdur and has declined Per pt B/P is running better B/P 124/90 and feels good Will forward to [employer] PA for review ./cy "  I am a bit confused - the patient had been on amlodipine chronically, which we were leaving alone given his HTN and intolerance to multiple other medications, so this was not a new start. (After 12/17/22 OV with me we were initially going to reduce the dose while starting Entresto, but ultimately kept the amlodipine on board as he did not start Entresto due to his concern for prior ACE/ARB reaction.) Triage - please reach back out to patient: Perhaps he meant amiodarone instead? If so, would recommend to please reach out to EP as they are the ones who started this. Please remove Imdur from his medicine list since he refuses to take this.  He no-showed for his general cardiology follow-up appointment with [facility] today and this needs to be rescheduled. Given decreased EF, Tessa had spoken with Dr. Acie Fredrickson about possibility of ischemic evaluation and they were going to discuss at today's visit, versus repeating echo when rate is better controlled in NSR. Please reschedule this visit, preferably with me or Tessa for continuity sake. If no availability, OK to schedule with other APP. Will cc Tessa as FYI. Pt has verbalized he did not wish to see Dr. Acie Fredrickson. Thank you!

## 2023-01-10 NOTE — Telephone Encounter (Signed)
Spoke with pt and after reviewing potential side effects never started Amlodipine Pt feels B/P issues maybe coming from hernia Refuses to start any new  meds  until has f/u at [place] pt the reason for call was to see if would start Imdur and has declined Per pt B/P is running better B/P 124/90 and feels good Will forward to [employer] PA for review ./cy

## 2023-01-11 NOTE — Telephone Encounter (Signed)
Spoke with the patient and rescheduled him for a follow up visit next week. I did confirm that the patient is still taking amlodipine. He states that he is not going to take amiodarone.

## 2023-01-11 NOTE — Telephone Encounter (Signed)
Patient returning call.

## 2023-01-11 NOTE — Telephone Encounter (Signed)
Left message for patient to call back  

## 2023-01-13 NOTE — Progress Notes (Deleted)
Cardiology Office Note:    Date:  01/13/2023   ID:  Patrick Roth, DOB 1946-08-05, MRN [id number]  PCP:  Allwardt, [name], [place] Prov[facility] Cardiologist:  Mertie Moores, MD Electrophysiologist:  [name], MD { Click to update primary MD,subspecialty MD or APP then REFRESH:1}  *** Referring MD: Allwardt, [name], PA-C   Chief Complaint:  No chief complaint on file. {Click here for Visit Info    :1}    History of Present Illness:   Patrick Roth is a 77 y.o. male with  history of parox Afib, HTN, CVA, syncope, CVA, syncope;He last saw PA Charlcie Cradle 07/16/2022, on tikosyn. Had many recent Afib episodes, but was overall happy with AF control so tikosyn continued. GI symptoms seem to precipitate AF episodes. Saw gen cards PA Harriet Pho 12/2022, was doing well, in NSR. An external echo report [facility] 12/15/22  EF 25-30%, BP elevated and Aflutter. grade 1 DD. Discussed with Dr. Acie Fredrickson and Planning for possible ischemic eval d/t reduced EF.     Patient saw EPS Mamie Levers NP 01/06/23 and was in Afib. Tikosyn stopped and was to begin Amiodarone 01/08/23 but he called in to say he wasn't going to take this drug.      Past Medical History:  Diagnosis Date   Atrial fibrillation/flutter    Intol of amio, beta blocker // Echo 12/22: inf HK, EF 50-55, normal RVSF, mild LaE, mild MR, mod MAC, AV sclerosis w/o aS   Colon polyps    Constipation 05/27/2015   DOE (dyspnea on exertion) 10/13/2015   Faintness 06/20/2016   History of chicken pox    Hyperlipidemia    Hypertension    Internal hemorrhoids    Polycythemia    Sciatica    Stroke ([place])    Hx of stroke in2008; recurrent CVA in 12/2020   Syncope and collapse 06/28/2016   Current Medications: No outpatient medications have been marked as taking for the 01/17/23 encounter (Appointment) with Imogene Burn, PA-C.    Allergies:   Crestor [rosuvastatin calcium], Spironolactone, Terazosin, Amiodarone, Atenolol, Bystolic  [nebivolol hcl], Gabapentin, Hydralazine, Lisinopril, Tizanidine, Clonidine, Hydrocodone-acetaminophen, Levofloxacin, Losartan, Maxzide [hydrochlorothiazide w-triamterene], Olmesartan, and Tussionex pennkinetic er [hydrocod poli-chlorphe poli er]   Social History   Tobacco Use   Smoking status: Never   Smokeless tobacco: Never   Tobacco comments:    Never smoke 03/11/22  Vaping Use   Vaping Use: Never used  Substance Use Topics   Alcohol use: No   Drug use: No    Family Hx: The patient's family history includes Healthy in his brother and sister; Heart disease in his brother and father; Hypertension (age of onset: 47) in his father; Lung cancer (age of onset: 49) in his mother; Lupus in his daughter; Other in his daughter. There is no history of Diabetes, Heart attack, Hyperlipidemia, or Sudden death.  ROS     Physical Exam:    VS:  There were no vitals taken for this visit.    Wt Readings from Last 3 Encounters:  01/06/23 215 lb 6.4 oz (97.7 kg)  12/31/22 211 lb 3.2 oz (95.8 kg)  12/17/22 210 lb (95.3 kg)    Physical Exam  GEN: Well nourished, well developed, in no acute distress  HEENT: normal  Neck: no JVD, carotid bruits, or masses Cardiac:RRR; no murmurs, rubs, or gallops  Respiratory:  clear to auscultation bilaterally, normal work of breathing GI: soft, nontender, nondistended, + BS Ext: without cyanosis, clubbing,  or edema, Good distal pulses bilaterally MS: no deformity or atrophy  Skin: warm and dry, no rash Neuro:  Alert and Oriented x 3, Strength and sensation are intact Psych: euthymic mood, full affect        EKGs/Labs/Other Test Reviewed:    EKG:  EKG is *** ordered today.  The ekg ordered today demonstrates ***  Recent Labs: 09/07/2022: B Natriuretic Peptide 231.3 12/17/2022: Hemoglobin 17.1; Magnesium 2.1; Magnesium 2.0; NT-Pro BNP 576; Platelets 180; TSH 2.180 12/28/2022: BUN 11; Creatinine, Ser 1.36; Potassium 4.1; Sodium 142 01/06/2023: ALT 15    Recent Lipid Panel No results for input(s): "CHOL", "TRIG", "HDL", "VLDL", "LDLCALC", "LDLDIRECT" in the last 8760 hours.   Prior CV Studies: {Select studies to display:26339}    TTE December 26, 2022 (external, scanned into chart)      TTE 07/05/22  1. Left ventricular ejection fraction, by estimation, is 60 to 65%. The left ventricle has normal function. The left ventricle has no regional wall motion abnormalities. There is mild left ventricular hypertrophy. Indeterminate diastolic filling due to  E-A fusion.   2. Right ventricular systolic function is normal. The right ventricular size is normal. There is normal pulmonary artery systolic pressure. The estimated right ventricular systolic pressure is 99991111 mmHg.   3. Left atrial size was moderately dilated.   4. The mitral valve is grossly normal. Trivial mitral valve regurgitation.   5. The aortic valve is tricuspid. Aortic valve regurgitation is not visualized. Aortic valve sclerosis is present, with no evidence of aortic valve stenosis.   6. The inferior vena cava is normal in size with greater than 50% respiratory variability, suggesting right atrial pressure of 3 mmHg.   Comparison(s): Changes from prior study are noted. 10/31/2021: LVEF 50-55%, inferobasal hypokinesis.     Echocardiogram 07/05/22 IMPRESSIONS     1. Left ventricular ejection fraction, by estimation, is 60 to 65%. The  left ventricle has normal function. The left ventricle has no regional  wall motion abnormalities. There is mild left ventricular hypertrophy.  Indeterminate diastolic filling due to  E-A fusion.   2. Right ventricular systolic function is normal. The right ventricular  size is normal. There is normal pulmonary artery systolic pressure. The  estimated right ventricular systolic pressure is 99991111 mmHg.   3. Left atrial size was moderately dilated.   4. The mitral valve is grossly normal. Trivial mitral valve  regurgitation.   5. The aortic valve is  tricuspid. Aortic valve regurgitation is not  visualized. Aortic valve sclerosis is present, with no evidence of aortic  valve stenosis.   6. The inferior vena cava is normal in size with greater than 50%  respiratory variability, suggesting right atrial pressure of 3 mmHg.   Comparison(s): Changes from prior study are noted. 10/31/2021: LVEF  50-55%, inferobasal hypokinesis.   FINDINGS   Left Ventricle: Left ventricular ejection fraction, by estimation, is 60  to 65%. The left ventricle has normal function. The left ventricle has no  regional wall motion abnormalities. Definity contrast agent was given IV  to delineate the left ventricular   endocardial borders. The left ventricular internal cavity size was normal  in size. There is mild left ventricular hypertrophy. Indeterminate  diastolic filling due to E-A fusion.   Right Ventricle: The right ventricular size is normal. No increase in  right ventricular wall thickness. Right ventricular systolic function is  normal. There is normal pulmonary artery systolic pressure. The tricuspid  regurgitant velocity is 2.65 m/s, and   with an  assumed right atrial pressure of 3 mmHg, the estimated right  ventricular systolic pressure is 99991111 mmHg.   Left Atrium: Left atrial size was moderately dilated.   Right Atrium: Right atrial size was normal in size.   Pericardium: There is no evidence of pericardial effusion.   Mitral Valve: The mitral valve is grossly normal. Trivial mitral valve  regurgitation.   Tricuspid Valve: The tricuspid valve is grossly normal. Tricuspid valve  regurgitation is trivial.   Aortic Valve: The aortic valve is tricuspid. Aortic valve regurgitation is  not visualized. Aortic valve sclerosis is present, with no evidence of  aortic valve stenosis.   Pulmonic Valve: The pulmonic valve was not well visualized. Pulmonic valve  regurgitation is not visualized.   Aorta: The aortic root and ascending aorta are  structurally normal, with  no evidence of dilitation.   Venous: The inferior vena cava is normal in size with greater than 50%  respiratory variability, suggesting right atrial pressure of 3 mmHg.   IAS/Shunts: No atrial level shunt detected by color flow Doppler     Recent Labs: 09/07/2022: B Natriuretic Peptide 231.3 09/18/2022: ALT 18 12/17/2022: Hemoglobin 17.1; Magnesium 2.1; Magnesium 2.0; NT-Pro BNP 576; Platelets 180; TSH 2.180 12/28/2022: BUN 11; Creatinine, Ser 1.36; Potassium 4.1; Sodium 142   Risk Assessment/Calculations/Metrics:   {Does this patient have ATRIAL FIBRILLATION?:[id number]}     No BP recorded.  {Refresh Note OR Click here to enter BP  :1}***    ASSESSMENT & PLAN:   No problem-specific Assessment & Plan notes found for this encounter.   New CM LVEF 25-30%  Afib uncontrolled rate-Tikosyn stopped and Amiodarone ordered but patient refusing to take.   HTN  History of CVA        {Are you ordering a CV Procedure (e.g. stress test, cath, DCCV, TEE, etc)?   Press F2        :[id number]   Dispo:  No follow-ups on file.   Medication Adjustments/Labs and Tests Ordered: Current medicines are reviewed at length with the patient today.  Concerns regarding medicines are outlined above.  Tests Ordered: No orders of the defined types were placed in this encounter.  Medication Changes: No orders of the defined types were placed in this encounter.  Sumner Boast, PA-C  01/13/2023 8:54 AM    [facility] [address], [place], [place]  [address] Phone: [phone]; Fax: [phone]

## 2023-01-14 ENCOUNTER — Ambulatory Visit: Payer: Medicare PPO | Admitting: Cardiovascular Disease

## 2023-01-16 ENCOUNTER — Other Ambulatory Visit (HOSPITAL_COMMUNITY): Payer: Self-pay | Admitting: Cardiology

## 2023-01-17 ENCOUNTER — Ambulatory Visit: Payer: Medicare PPO | Admitting: Physician Assistant

## 2023-01-18 ENCOUNTER — Telehealth: Payer: Self-pay | Admitting: Cardiology

## 2023-01-18 ENCOUNTER — Ambulatory Visit: Payer: Medicare PPO

## 2023-01-18 ENCOUNTER — Other Ambulatory Visit (HOSPITAL_COMMUNITY): Payer: Self-pay | Admitting: Cardiology

## 2023-01-18 MED ORDER — DOFETILIDE 250 MCG PO CAPS: 250.0000 ug | ORAL_CAPSULE | Freq: Two times a day (BID) | ORAL | 3 refills | Status: AC

## 2023-01-18 NOTE — Telephone Encounter (Signed)
Follow Up:     Patient is calling back to see what he needs to do about his medicine please.

## 2023-01-18 NOTE — Telephone Encounter (Signed)
Pt reports that he never started the Amiodarone. Pt would like to resume Tikosyn -- reports that he restarted it on 2/18 on his own. Discussed w/ MD -- pt made aware he needs EKG tomorrow.  Pt scheduled to come in tomorrow morning for nurse visit EKG. Educated pt to [employer].  Advised pt that going forward he cannot miss more than one dose w/o calling and discussing with the ordering provider. Patient verbalized understanding and agreeable to plan.

## 2023-01-18 NOTE — Telephone Encounter (Signed)
Pt c/o medication issue:  1. Name of Medication: dofetilide (TIKOSYN) 250 MCG capsule [id number]   2. How are you currently taking this medication (dosage and times per day)?   3. Are you having a reaction (difficulty breathing--STAT)?   4. What is your medication issue?  Pt states he wants start tikosyn back, does not want to take amiodarone as stated in last phone note.  Please advise as this was discontinued at his last appt

## 2023-01-19 ENCOUNTER — Ambulatory Visit: Payer: Medicare PPO | Attending: Interventional Cardiology | Admitting: *Deleted

## 2023-01-19 VITALS — HR 47 | Ht 68.0 in

## 2023-01-19 DIAGNOSIS — Z79899 Other long term (current) drug therapy: Secondary | ICD-10-CM | POA: Diagnosis not present

## 2023-01-19 DIAGNOSIS — I10 Essential (primary) hypertension: Secondary | ICD-10-CM

## 2023-01-19 NOTE — Addendum Note (Signed)
Addended by: Stanton Kidney on: 01/19/2023 12:34 PM   Modules accepted: Orders

## 2023-01-19 NOTE — Progress Notes (Signed)
   Nurse Visit   Date of Encounter: 01/19/2023 ID: Patrick Roth, DOB 1946-03-14, MRN [id number]  PCP:  Allwardt, [name], [place] [facility] Cardiologist:  Mertie Moores, MD Electrophysiologist:  Constance Haw, MD      Visit Details   VS:  Pulse (!) 47   Ht 5' 8"$  (1.727 m)   BMI 32.75 kg/m  , BMI Body mass index is 32.75 kg/m.  Wt Readings from Last 3 Encounters:  01/06/23 215 lb 6.4 oz (97.7 kg)  12/31/22 211 lb 3.2 oz (95.8 kg)  12/17/22 210 lb (95.3 kg)     Reason for visit: EKG post Tikosyn restart (pt restarted on his own) Performed today: Vitals, EKG, Provider consulted:Dr. Irish Lack, and Education Changes (medications, testing, etc.) : none Length of Visit: 20 minutes  EKG showed normal rhythm, sinus brady, HR 47.  Reviewed by Dr. Lovena Le, signed off by Dr. Irish Lack, [place].   Medications Adjustments/Labs and Tests Ordered: No orders of the defined types were placed in this encounter.  No orders of the defined types were placed in this encounter.    Signed, Stanton Kidney, RN  01/19/2023 10:43 AM

## 2023-02-16 ENCOUNTER — Ambulatory Visit: Payer: Medicare PPO | Admitting: Physician Assistant

## 2023-02-28 ENCOUNTER — Emergency Department (HOSPITAL_COMMUNITY)
Admission: EM | Admit: 2023-02-28 | Discharge: 2023-03-01 | Disposition: A | Discharge status: Home / Self Care | Payer: Medicare PPO | Attending: Emergency Medicine | Admitting: Emergency Medicine

## 2023-02-28 DIAGNOSIS — K6289 Other specified diseases of anus and rectum: Secondary | ICD-10-CM

## 2023-02-28 DIAGNOSIS — Z79899 Other long term (current) drug therapy: Secondary | ICD-10-CM | POA: Insufficient documentation

## 2023-02-28 DIAGNOSIS — R944 Abnormal results of kidney function studies: Secondary | ICD-10-CM | POA: Insufficient documentation

## 2023-02-28 DIAGNOSIS — Z7901 Long term (current) use of anticoagulants: Secondary | ICD-10-CM | POA: Diagnosis not present

## 2023-02-28 NOTE — ED Triage Notes (Signed)
Arrived via [facility] recently seen at [facility] for fecal incontinence wants help with managing care per EMS. 138/80 BP 96 pulse 98%

## 2023-03-01 ENCOUNTER — Encounter (HOSPITAL_COMMUNITY): Payer: Self-pay

## 2023-03-01 ENCOUNTER — Other Ambulatory Visit: Payer: Self-pay

## 2023-03-01 LAB — CBC WITH DIFFERENTIAL/PLATELET
Abs Immature Granulocytes: 0.06 10*3/uL (ref 0.00–0.07)
Basophils Absolute: 0 10*3/uL (ref 0.0–0.1)
Basophils Relative: 0 %
Eosinophils Absolute: 0 10*3/uL (ref 0.0–0.5)
Eosinophils Relative: 0 %
HCT: 51.5 % (ref 39.0–52.0)
Hemoglobin: 17 g/dL (ref 13.0–17.0)
Immature Granulocytes: 1 %
Lymphocytes Relative: 18 %
Lymphs Abs: 1.2 10*3/uL (ref 0.7–4.0)
MCH: 29.3 pg (ref 26.0–34.0)
MCHC: 33 g/dL (ref 30.0–36.0)
MCV: 88.8 fL (ref 80.0–100.0)
Monocytes Absolute: 0.8 10*3/uL (ref 0.1–1.0)
Monocytes Relative: 12 %
Neutro Abs: 4.6 10*3/uL (ref 1.7–7.7)
Neutrophils Relative %: 69 %
Platelets: 155 10*3/uL (ref 150–400)
RBC: 5.8 MIL/uL (ref 4.22–5.81)
RDW: 14 % (ref 11.5–15.5)
WBC: 6.8 10*3/uL (ref 4.0–10.5)
nRBC: 0 % (ref 0.0–0.2)

## 2023-03-01 LAB — BASIC METABOLIC PANEL
Anion gap: 12 (ref 5–15)
BUN: 12 mg/dL (ref 8–23)
CO2: 23 mmol/L (ref 22–32)
Calcium: 8.9 mg/dL (ref 8.9–10.3)
Chloride: 102 mmol/L (ref 98–111)
Creatinine, Ser: 1.53 mg/dL — ABNORMAL HIGH (ref 0.61–1.24)
GFR, Estimated: 46 mL/min — ABNORMAL LOW (ref >60)
Glucose, Bld: 102 mg/dL — ABNORMAL HIGH (ref 70–99)
Potassium: 3.9 mmol/L (ref 3.5–5.1)
Sodium: 137 mmol/L (ref 135–145)

## 2023-03-01 MED ORDER — SODIUM CHLORIDE 0.9 % IV BOLUS
500.0000 mL | Freq: Once | INTRAVENOUS | Status: AC
  Administered 2023-03-01: 500 mL via INTRAVENOUS

## 2023-03-01 MED ORDER — KETOROLAC TROMETHAMINE 30 MG/ML IJ SOLN
15.0000 mg | Freq: Once | INTRAMUSCULAR | Status: AC
  Administered 2023-03-01: 15 mg via INTRAVENOUS
  Filled 2023-03-01: qty 1

## 2023-03-01 MED ORDER — ONDANSETRON HCL 4 MG/2ML IJ SOLN
4.0000 mg | Freq: Once | INTRAMUSCULAR | Status: AC
  Administered 2023-03-01: 4 mg via INTRAVENOUS
  Filled 2023-03-01: qty 2

## 2023-03-01 MED ORDER — HYDROCORTISONE ACETATE 25 MG RE SUPP
25.0000 mg | Freq: Once | RECTAL | Status: AC
  Administered 2023-03-01: 25 mg via RECTAL
  Filled 2023-03-01: qty 1

## 2023-03-01 NOTE — ED Provider Notes (Signed)
[place] EMERGENCY DEPARTMENT AT [facility] Provider Note   CSN: [id number] Arrival date & time: 02/28/23  2311     History  Chief Complaint  Patient presents with   GI Problem    Patrick Roth is a 77 y.o. male.  The history is provided by the patient.  GI Problem This is a chronic problem. The current episode started more than 1 week ago (at least 2.5 years.). The problem has not changed since onset.Pertinent negatives include no abdominal pain. Nothing aggravates the symptoms. Nothing relieves the symptoms. Treatments tried: many. The treatment provided no relief.  Patient with rectal pain and incontinence for at least 2.5 years presents with ongoing symptoms.  He is on home narcotics and has been seen twice in the ED with negative work including CT at [place] and then by his own GI doctor earlier in the day.  He has been referred to a pelvic floor specialist but is here for ongoing symptoms.       Home Medications Prior to Admission medications   Medication Sig Start Date End Date Taking? Authorizing Provider  amLODipine (NORVASC) 10 MG tablet Take 1 tablet (10 mg total) by mouth daily. 12/22/22   Dunn, Tacey Ruiz, PA-C  apixaban (ELIQUIS) 5 MG TABS tablet TAKE 1 TABLET TWICE DAILY 08/05/22   [name], Deloris Ping, MD  COMIRNATY SUSP injection Inject into the muscle. 09/29/22   [provider]  dicyclomine (BENTYL) 20 MG tablet Take 20 mg by mouth every 6 (six) hours as needed. 11/19/22   [provider]  dofetilide (TIKOSYN) 250 MCG capsule Take 1 capsule (250 mcg total) by mouth 2 (two) times daily. 01/18/23   Camnitz, Andree Coss, MD  famotidine (PEPCID) 20 MG tablet Take 20 mg by mouth daily. 11/19/22   [provider]  lansoprazole (PREVACID) 30 MG capsule Take 30 mg by mouth daily. 11/19/22   [provider]  metoprolol tartrate (LOPRESSOR) 25 MG tablet Take 1 tablet (25 mg total) by mouth 2 (two) times daily. 12/17/22   Dunn, Tacey Ruiz,  PA-C  oxyCODONE-acetaminophen (PERCOCET/ROXICET) 5-325 MG tablet Take 1 tablet by mouth every 8 (eight) hours as needed for moderate pain. 03/18/22   [provider]  polyethylene glycol powder (GLYCOLAX/MIRALAX) 17 GM/SCOOP powder Take 17 g by mouth in the morning and at bedtime. Start with 1 scoop 2 times per day until bowels are moving, then reduce to 1 scoop daily. Drink 64oz of water daily. Patient taking differently: Take 17 g by mouth daily as needed for mild constipation. 09/18/21   Dulce Sellar, NP  pregabalin (LYRICA) 50 MG capsule Take 50 mg by mouth 3 (three) times daily. 04/20/22   [provider]  REPATHA SURECLICK 140 MG/ML SOAJ INJECT 1 PEN INTO THE SKIN EVERY 14 DAYS. 08/05/22   Camnitz, Andree Coss, MD  Vitamin D, Ergocalciferol, (DRISDOL) 1.25 MG (50000 UNIT) CAPS capsule Take 1 capsule (50,000 Units total) by mouth every 7 (seven) days. 05/10/22   Allwardt, Crist Infante, PA-C      Allergies    Crestor [rosuvastatin calcium], Spironolactone, Terazosin, Amiodarone, Atenolol, Bystolic [nebivolol hcl], Gabapentin, Hydralazine, Lisinopril, Tizanidine, Clonidine, Hydrocodone-acetaminophen, Levofloxacin, Losartan, Maxzide [hydrochlorothiazide w-triamterene], Olmesartan, and Tussionex pennkinetic er [hydrocod poli-chlorphe poli er]    Review of Systems   Review of Systems  Constitutional:  Negative for fever.  Gastrointestinal:  Positive for nausea. Negative for abdominal pain, anal bleeding, constipation, diarrhea and vomiting.  Genitourinary:  Negative for difficulty urinating and dysuria.  Neurological:  Negative for weakness and numbness.  All other systems reviewed and are negative.   Physical Exam Updated Vital Signs BP (!) 140/102   Pulse 95   Resp 16   SpO2 99%  Physical Exam Constitutional:      General: He is not in acute distress.    Appearance: He is well-developed. He is not diaphoretic.  HENT:     Head: Normocephalic and atraumatic.     Nose:  Nose normal.  Eyes:     Conjunctiva/sclera: Conjunctivae normal.     Pupils: Pupils are equal, round, and reactive to light.  Cardiovascular:     Rate and Rhythm: Normal rate and regular rhythm.     Pulses: Normal pulses.     Heart sounds: Normal heart sounds.  Pulmonary:     Effort: Pulmonary effort is normal.     Breath sounds: Normal breath sounds. No wheezing or rales.  Abdominal:     General: Bowel sounds are normal.     Palpations: Abdomen is soft.     Tenderness: There is no abdominal tenderness. There is no guarding or rebound.  Musculoskeletal:        General: Normal range of motion.     Cervical back: Normal range of motion and neck supple.  Skin:    General: Skin is warm and dry.     Capillary Refill: Capillary refill takes less than 2 seconds.  Neurological:     General: No focal deficit present.     Mental Status: He is alert and oriented to person, place, and time.     Deep Tendon Reflexes: Reflexes normal.  Psychiatric:        Mood and Affect: Mood normal.     ED Results / Procedures / Treatments   Labs (all labs ordered are listed, but only abnormal results are displayed) Results for orders placed or performed during the hospital encounter of 02/28/23  CBC with Differential  Result Value Ref Range   WBC 6.8 4.0 - 10.5 K/uL   RBC 5.80 4.22 - 5.81 MIL/uL   Hemoglobin 17.0 13.0 - 17.0 g/dL   HCT 57.3 22.0 - 25.4 %   MCV 88.8 80.0 - 100.0 fL   MCH 29.3 26.0 - 34.0 pg   MCHC 33.0 30.0 - 36.0 g/dL   RDW 27.0 62.3 - 76.2 %   Platelets 155 150 - 400 K/uL   nRBC 0.0 0.0 - 0.2 %   Neutrophils Relative % 69 %   Neutro Abs 4.6 1.7 - 7.7 K/uL   Lymphocytes Relative 18 %   Lymphs Abs 1.2 0.7 - 4.0 K/uL   Monocytes Relative 12 %   Monocytes Absolute 0.8 0.1 - 1.0 K/uL   Eosinophils Relative 0 %   Eosinophils Absolute 0.0 0.0 - 0.5 K/uL   Basophils Relative 0 %   Basophils Absolute 0.0 0.0 - 0.1 K/uL   Immature Granulocytes 1 %   Abs Immature Granulocytes 0.06  0.00 - 0.07 K/uL  Basic metabolic panel  Result Value Ref Range   Sodium 137 135 - 145 mmol/L   Potassium 3.9 3.5 - 5.1 mmol/L   Chloride 102 98 - 111 mmol/L   CO2 23 22 - 32 mmol/L   Glucose, Bld 102 (H) 70 - 99 mg/dL   BUN 12 8 - 23 mg/dL   Creatinine, Ser 8.31 (H) 0.61 - 1.24 mg/dL   Calcium 8.9 8.9 - 51.7 mg/dL   GFR, Estimated 46 (L) >60 mL/min   Anion  gap 12 5 - 15   No results found.  Radiology No results found.  Procedures Procedures    Medications Ordered in ED Medications  hydrocortisone (ANUSOL-HC) suppository 25 mg (has no administration in time range)  ondansetron (ZOFRAN) injection 4 mg (4 mg Intravenous Given 03/01/23 0024)  ketorolac (TORADOL) 30 MG/ML injection 15 mg (15 mg Intravenous Given 03/01/23 0023)  sodium chloride 0.9 % bolus 500 mL (500 mLs Intravenous New Bag/Given 03/01/23 0112)    ED Course/ Medical Decision Making/ A&P                             Medical Decision Making Patient with chronic rectal pain seen by GI and in [facility]'s ED twice this week   Amount and/or Complexity of Data Reviewed Independent Historian: EMS    Details: See above  External Data Reviewed: labs, radiology and notes.    Details: Previous GI and ED notes labs and CT reviewed  Labs: ordered.    Details: All labs reviewed: normal white count 6.8, normal hemoglobin 17, normal platelet count.  Normal sodium 137, normal potassium 3.9, creatinine 1.53.   Risk Prescription drug management. Risk Details: Patient has chronic rectal pain that needs pelvic PT.  There is no acute emergency at this time.  Pain and nausea were addressed.  Patient will need to follow with pelvic PT.  Stable for discharge.  Strict return     Final Clinical Impression(s) / ED Diagnoses Final diagnoses:  Rectal pain   Return for intractable cough, coughing up blood, fevers > 100.4 unrelieved by medication, shortness of breath, intractable vomiting, chest pain, shortness of breath, weakness,  numbness, changes in speech, facial asymmetry, abdominal pain, passing out, Inability to tolerate liquids or food, cough, altered mental status or any concerns. No signs of systemic illness or infection. The patient is nontoxic-appearing on exam and vital signs are within normal limits.  I have reviewed the triage vital signs and the nursing notes. Pertinent labs & imaging results that were available during my care of the patient were reviewed by me and considered in my medical decision making (see chart for details). After history, exam, and medical workup I feel the patient has been appropriately medically screened and is safe for discharge home. Pertinent diagnoses were discussed with the patient. Patient was given return precautions.  Rx / DC Orders ED Discharge Orders     None         Jodell Weitman, MD 03/01/23 [id number]

## 2023-03-18 ENCOUNTER — Other Ambulatory Visit: Payer: Self-pay | Admitting: Cardiology

## 2023-03-18 DIAGNOSIS — I4819 Other persistent atrial fibrillation: Secondary | ICD-10-CM

## 2023-03-18 NOTE — Telephone Encounter (Signed)
Prescription refill request for Eliquis received. Indication: afib  Last office visit: 01/05/2023, [place] Scr: 1.21, 03/18/2023 Age: 77 yo  Weight:  97.7 kg   Refill sent.

## 2023-04-03 NOTE — Progress Notes (Unsigned)
  Electrophysiology Office Note:   Date:  04/04/2023  ID:  Patrick Roth, DOB [birth year], MRN [id number]  Primary Cardiologist: Kristeen Miss, MD Electrophysiologist: Regan Lemming, MD   History of Present Illness:   Patrick Roth is a 77 y.o. male with h/o parox Afib, HTN, CVA, and syncope seen today for routine electrophysiology followup.   He was switched to amiodarone 01/06/23, but didn't like the side effect profile so never started and resumed Tikosyn. EKG was stable after he alerted [place].   Since last being seen in our clinic the patient reports doing about the same. He denies chest pain, palpitations, dyspnea, PND, orthopnea, nausea, vomiting, dizziness, syncope, edema, weight gain, or early satiety.  He states he will never take amiodarone due to the listed side effects.  He has felt good on Tikosyn and would prefer to continue, despite on-going discussions that he is in flutter, which has likely been persistent.   Review of systems complete and found to be negative unless listed in HPI.   Studies Reviewed:    EKG is ordered today. Personal review shows likely AFL ~99 bpm  Risk Assessment/Calculations:    CHA2DS2-VASc Score = 5   Physical Exam:   VS:  BP 122/82   Pulse 99   Ht 5\' 8"  (1.727 m)   Wt 203 lb (92.1 kg)   SpO2 98%   BMI 30.87 kg/m    Wt Readings from Last 3 Encounters:  04/04/23 203 lb (92.1 kg)  01/06/23 215 lb 6.4 oz (97.7 kg)  12/31/22 211 lb 3.2 oz (95.8 kg)     GEN: Well nourished, well developed in no acute distress NECK: No JVD; No carotid bruits CARDIAC: Regular rate and rhythm, no murmurs, rubs, gallops RESPIRATORY:  Clear to auscultation without rales, wheezing or rhonchi  ABDOMEN: Soft, non-tender, non-distended EXTREMITIES:  No edema; No deformity   ASSESSMENT AND PLAN:    #Parox AF #Atrial flutter, likely persistent EKG today shows likely atrial flutter vs course fib.  Long discussion with patient about whether to continue tikosyn at  this time. Suspect he has been persistently out of rhythm likely since August, and that continuing Tikosyn is inciting risk without clear benefit.  Will place 2 week monitor.  Based on results, would offer [facility] vs STOPPING Tikosyn if AFL 100% of the time He states that he will not take amiodarone at any time due to the side effect profile.  Suspect the paucity of his symptoms is due to controlled ventricular rates, not periods of sinus. BMET, Mg today.    #Hypercoag d/t AF Continue eliquis 5 mg BID for CHA2DS2VASc  of at least 5.    #newly diagnosed HFrEF Echo 11/25/2022 with EF 25-30% His follow up has varied and previously discussed ischemic work up has not been performed.  The majority of todays conversation focused on managing his atrial flutter. He is resistant to tests, procedures, and medication changes. Will complete monitor as above, and then would likely recommend Coronary CT as least invasive assessment of his cardiomyopathy.   Would also recommend Pharm-D visit to discuss GDMT and the many questions and concerns he will have with any new medication suggestions.   #HTN Stable on current regimen   Follow up with EP APP in 3 months, sooner with issues. Would recommend completing recommended ischemic work up and gen cards follow up (?Pharm-D) post monitor.   Signed, Graciella Freer, PA-C

## 2023-04-04 ENCOUNTER — Ambulatory Visit: Payer: Medicare PPO | Attending: Student

## 2023-04-04 ENCOUNTER — Ambulatory Visit: Payer: Medicare PPO | Attending: Student | Admitting: Student

## 2023-04-04 ENCOUNTER — Encounter: Payer: Self-pay | Admitting: Student

## 2023-04-04 VITALS — BP 122/82 | HR 99 | Ht 68.0 in | Wt 203.0 lb

## 2023-04-04 DIAGNOSIS — I10 Essential (primary) hypertension: Secondary | ICD-10-CM | POA: Diagnosis not present

## 2023-04-04 DIAGNOSIS — I480 Paroxysmal atrial fibrillation: Secondary | ICD-10-CM

## 2023-04-04 DIAGNOSIS — I5020 Unspecified systolic (congestive) heart failure: Secondary | ICD-10-CM | POA: Diagnosis not present

## 2023-04-04 DIAGNOSIS — I484 Atypical atrial flutter: Secondary | ICD-10-CM

## 2023-04-04 DIAGNOSIS — I4891 Unspecified atrial fibrillation: Secondary | ICD-10-CM | POA: Diagnosis not present

## 2023-04-04 NOTE — Progress Notes (Unsigned)
Enrolled for [employer] to mail a ZIO XT long term holter monitor to the patients address on file.   Dr. Camnitz to read. 

## 2023-04-04 NOTE — Patient Instructions (Signed)
Medication Instructions:  Your physician recommends that you continue on your current medications as directed. Please refer to the Current Medication list given to you today.  *If you need a refill on your cardiac medications before your next appointment, please call your pharmacy*   Lab Work: BMET, MAG--TODAY If you have labs (blood work) drawn today and your tests are completely normal, you will receive your results only by: MyChart Message (if you have MyChart) OR A paper copy in the mail If you have any lab test that is abnormal or we need to change your treatment, we will call you to review the results.   Testing/Procedures: Christena Deem- Long Term Monitor Instructions  Your physician has requested you wear a ZIO patch monitor for 14 days.  This is a single patch monitor. [employer] supplies one patch monitor per enrollment. Additional stickers are not available. Please do not apply patch if you will be having a Nuclear Stress Test,  Echocardiogram, Cardiac CT, MRI, or Chest Xray during the period you would be wearing the  monitor. The patch cannot be worn during these tests. You cannot remove and re-apply the  ZIO XT patch monitor.  Your ZIO patch monitor will be mailed 3 day USPS to your address on file. It may take 3-5 days  to receive your monitor after you have been enrolled.  Once you have received your monitor, please review the enclosed instructions. Your monitor  has already been registered assigning a specific monitor serial # to you.  Billing and Patient Assistance Program Information  We have supplied [employer] with any of your insurance information on file for billing purposes. [employer] offers a sliding scale Patient Assistance Program for patients that do not have  insurance, or whose insurance does not completely cover the cost of the ZIO monitor.  You must apply for the Patient Assistance Program to qualify for this discounted rate.  To apply, please call [employer] at  [phone], select option 4, select option 2, ask to apply for  Patient Assistance Program. Meredeth Ide will ask your household income, and how many people  are in your household. They will quote your out-of-pocket cost based on that information.  [employer] will also be able to set up a 73-month, interest-free payment plan if needed.  Applying the monitor   Shave hair from upper left chest.  Hold abrader disc by orange tab. Rub abrader in 40 strokes over the upper left chest as  indicated in your monitor instructions.  Clean area with 4 enclosed alcohol pads. Let dry.  Apply patch as indicated in monitor instructions. Patch will be placed under collarbone on left  side of chest with arrow pointing upward.  Rub patch adhesive wings for 2 minutes. Remove white label marked "1". Remove the white  label marked "2". Rub patch adhesive wings for 2 additional minutes.  While looking in a mirror, press and release button in center of patch. A small green light will  flash 3-4 times. This will be your only indicator that the monitor has been turned on.  Do not shower for the first 24 hours. You may shower after the first 24 hours.  Press the button if you feel a symptom. You will hear a small click. Record Date, Time and  Symptom in the Patient Logbook.  When you are ready to remove the patch, follow instructions on the last 2 pages of Patient  Logbook. Stick patch monitor onto the last page of Patient Logbook.  Place Patient Logbook  in the blue and white box. Use locking tab on box and tape box closed  securely. The blue and white box has prepaid postage on it. Please place it in the mailbox as  soon as possible. Your physician should have your test results approximately 7 days after the  monitor has been mailed back to [facility].  Call [facility] Customer Care at [phone] if you have questions regarding  your ZIO XT patch monitor. Call them immediately if you see an orange light  blinking on your  monitor.  If your monitor falls off in less than 4 days, contact our Monitor department at [phone].  If your monitor becomes loose or falls off after 4 days call [employer] at [phone] for  suggestions on securing your monitor    Follow-Up: At [facility], you and your health needs are our priority.  As part of our continuing mission to provide you with exceptional heart care, we have created designated Provider Care Teams.  These Care Teams include your primary Cardiologist (physician) and Advanced Practice Providers (APPs -  Physician Assistants and Nurse Practitioners) who all work together to provide you with the care you need, when you need it.  Your next appointment:   3 month(s)  Provider:   Casimiro Needle "Otilio Saber, PA-C

## 2023-04-05 LAB — BASIC METABOLIC PANEL
BUN/Creatinine Ratio: 7 — ABNORMAL LOW (ref 10–24)
BUN: 8 mg/dL (ref 8–27)
CO2: 22 mmol/L (ref 20–29)
Calcium: 9.5 mg/dL (ref 8.6–10.2)
Chloride: 101 mmol/L (ref 96–106)
Creatinine, Ser: 1.17 mg/dL (ref 0.76–1.27)
Glucose: 97 mg/dL (ref 70–99)
Potassium: 4.3 mmol/L (ref 3.5–5.2)
Sodium: 141 mmol/L (ref 134–144)
eGFR: 64 mL/min/{1.73_m2} (ref >59)

## 2023-04-05 LAB — MAGNESIUM: Magnesium: 2.1 mg/dL (ref 1.6–2.3)

## 2023-04-06 ENCOUNTER — Ambulatory Visit: Payer: Medicare PPO | Admitting: Cardiovascular Disease

## 2023-04-08 DIAGNOSIS — I484 Atypical atrial flutter: Secondary | ICD-10-CM

## 2023-04-25 ENCOUNTER — Telehealth: Payer: Self-pay | Admitting: Cardiovascular Disease

## 2023-04-25 NOTE — Telephone Encounter (Signed)
Afib clinic called and scheduled the pt to see them on 05/09/23 at 1130 to see [facility] PA-C.  Will send this information to the pts Primary Cards as an FYI, as well as Otilio Saber PA-C.

## 2023-04-25 NOTE — Telephone Encounter (Signed)
Patrick Roth with [employer] called about patient's results of monitor worn from May 7th to 23rd. Patient had 75 % burden of atrial flutter, with highest rate 213 bpm for 60 seconds. This can be found on strip # 6 on page 13. Patient also had a run of VT. Will forward to ordering provider.

## 2023-04-25 NOTE — Telephone Encounter (Signed)
Called patient to let him know about his results. Informed patient that Patrick Saber PA would like him to follow up in the A. FIB clinic. Patient agreed to plan.

## 2023-04-25 NOTE — Telephone Encounter (Signed)
Patrick Roth with [employer] is calling to report abnormal Zio monitor results.  Phone#: [phone] (reference #: [id number])

## 2023-05-09 ENCOUNTER — Ambulatory Visit (HOSPITAL_COMMUNITY): Payer: Medicare PPO | Admitting: Physician Assistant

## 2023-06-09 ENCOUNTER — Encounter: Payer: Self-pay | Admitting: Pharmacist

## 2023-06-09 NOTE — Progress Notes (Signed)
Patient previously followed by [facility] pharmacist. Per clinical review, no pharmacist appointment needed at this time, no longer associated with [facility] primary care.

## 2023-06-14 ENCOUNTER — Encounter: Payer: Medicare PPO | Admitting: Pharmacist

## 2023-07-11 NOTE — Progress Notes (Deleted)
  Electrophysiology Office Note:   Date:  07/11/2023  ID:  Patrick Roth, DOB [birth year], MRN [id number]  Primary Cardiologist: Kristeen Miss, MD Electrophysiologist: Regan Lemming, MD     History of Present Illness:   Patrick Roth is a 77 y.o. male with h/o AF/Atrial flutter, HTN, CVA and syncope seen today for routine electrophysiology followup.   Seen in clinic 04/04/2023 and noted to be in AFL. Pt refused to consider amiodarone. Monitor placed which showed 75% burden; Planned on Gen cards and AF clinic follow up to discuss and schedule [facility], but he cancelled.   Since last being seen in our clinic the patient reports doing ***.  he denies chest pain, palpitations, dyspnea, PND, orthopnea, nausea, vomiting, dizziness, syncope, edema, weight gain, or early satiety.   Review of systems complete and found to be negative unless listed in HPI.   EP Information / Studies Reviewed:    EKG is ordered today. Personal review as below.       Monitor 04/2023 Patient had a min HR of 42 bpm, max HR of 219 bpm, and avg HR of 91 bpm.  Predominant rhythm was atrial flutter (75% burden) Heart rate in atrial flutter ranging from 58-219 bpm (avg of 98 bpm) Triggered episodes associated with atrial flutter Less than 1% ventricular and supraventricular ectopy Wide-complex tachycardia, heart rate greater than 200, potentially 1-1 atrial flutter  Risk Assessment/Calculations:   {Does this patient have ATRIAL FIBRILLATION?:9373065301} No BP recorded.  {Refresh Note OR Click here to enter BP  :1}***        Physical Exam:   VS:  There were no vitals taken for this visit.   Wt Readings from Last 3 Encounters:  04/04/23 203 lb (92.1 kg)  01/06/23 215 lb 6.4 oz (97.7 kg)  12/31/22 211 lb 3.2 oz (95.8 kg)     GEN: Well nourished, well developed in no acute distress NECK: No JVD; No carotid bruits CARDIAC: {EPRHYTHM:28826}, no murmurs, rubs, gallops RESPIRATORY:  Clear to auscultation without  rales, wheezing or rhonchi  ABDOMEN: Soft, non-tender, non-distended EXTREMITIES:  No edema; No deformity   ASSESSMENT AND PLAN:    Parox AF Atrial flutter, likely persistent EKG today shows ***  He has preferred to continue Tikosyn.  Refuses transition to amiodarone.  *** Long discussion with patient about whether to continue tikosyn at this time. Suspect he has been persistently out of rhythm likely since August, and that continuing Tikosyn is inciting risk without clear benefit.  Will place 2 week monitor.  Based on results, would offer [facility] vs STOPPING Tikosyn if AFL 100% of the time He states that he will not take amiodarone at any time due to the side effect profile.  Suspect the paucity of his symptoms is due to controlled ventricular rates, not periods of sinus. BMET, Mg today.    Hypercoag d/t AF Continue eliquis 5 mg BID for CHA2DS2VASc  of at least 5.    Chronic systolic CHF.  Echo 11/25/2022 with EF 25-30% Has missed several appointments and still not had ischemic work up. *** Coronary CT.  *** GDMT.    HTN Stable on current regimen   {Click here to Review PMH, Prob List, Meds, Allergies, SHx, FHx  :1}   Follow up with {NFAOZ:30865} {EPFOLLOW HQ:46962}  Signed, Graciella Freer, PA-C

## 2023-07-12 ENCOUNTER — Ambulatory Visit: Payer: Medicare PPO | Attending: Student | Admitting: Student

## 2023-07-12 ENCOUNTER — Encounter: Payer: Self-pay | Admitting: Student

## 2023-07-12 DIAGNOSIS — I484 Atypical atrial flutter: Secondary | ICD-10-CM

## 2023-07-12 DIAGNOSIS — I10 Essential (primary) hypertension: Secondary | ICD-10-CM

## 2023-07-12 DIAGNOSIS — I4891 Unspecified atrial fibrillation: Secondary | ICD-10-CM

## 2023-07-12 DIAGNOSIS — I480 Paroxysmal atrial fibrillation: Secondary | ICD-10-CM

## 2023-07-18 ENCOUNTER — Telehealth: Payer: Self-pay | Admitting: Cardiology

## 2023-07-18 NOTE — Telephone Encounter (Signed)
Dr. Elberta Fortis made aware. Called pt and told him we wish him the best.  We are here if ever needed. He appreciates the call.

## 2023-07-18 NOTE — Telephone Encounter (Signed)
Patient is calling because he wanted to inform us that he has switched to a new cardiologist in [facility].

## 2023-08-17 IMAGING — DX DG CHEST 1V PORT
1 series · 1 of 1 positions shown · non-contrast
Comparison: Comparison made with February 03, 2021.

CLINICAL DATA: Cough, dry cough and chills for 3 days,

EXAM:
PORTABLE CHEST 1 VIEW

[chest]
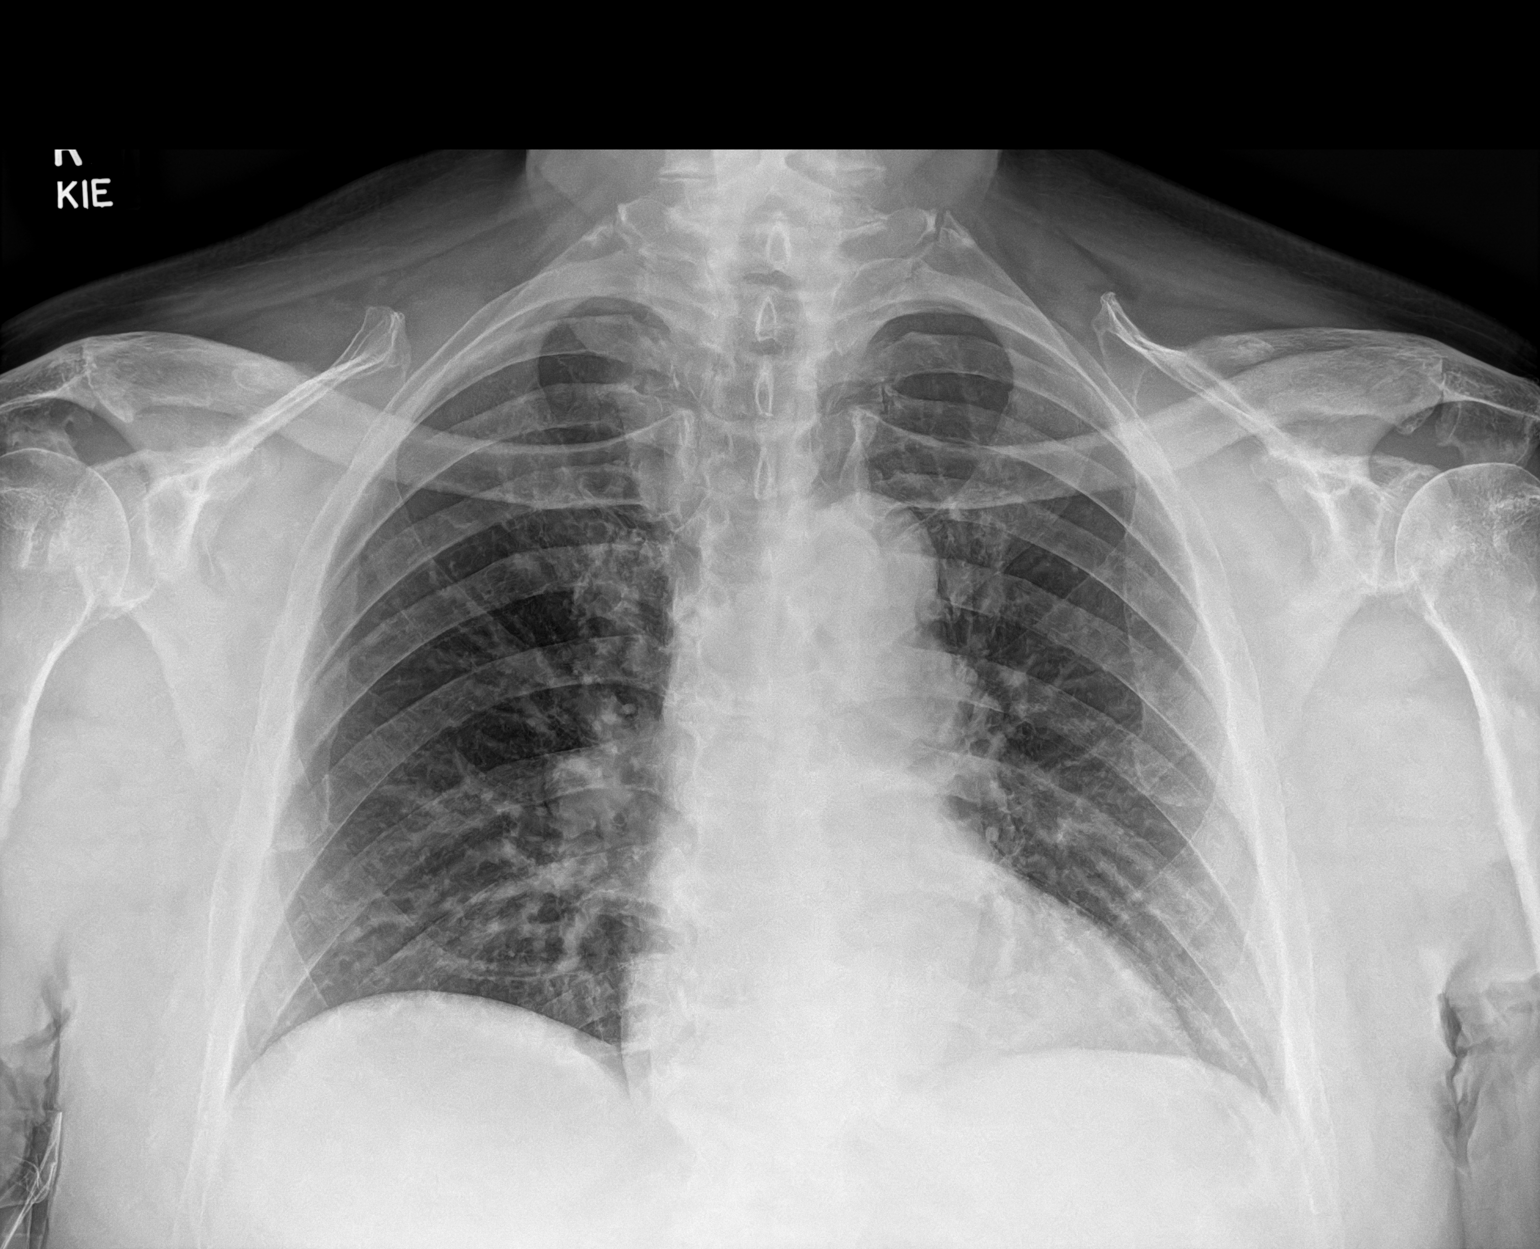

[1 of 1 positions shown; findings below may reference images not displayed]

FINDINGS: The heart size and mediastinal contours are within normal limits.
Both lungs are clear. The visualized skeletal structures are
unremarkable.
IMPRESSION: No active disease.

## 2023-08-29 ENCOUNTER — Other Ambulatory Visit: Payer: Self-pay | Admitting: Cardiology

## 2023-08-29 DIAGNOSIS — E782 Mixed hyperlipidemia: Secondary | ICD-10-CM

## 2023-08-29 DIAGNOSIS — I700 Atherosclerosis of aorta: Secondary | ICD-10-CM

## 2023-08-30 ENCOUNTER — Other Ambulatory Visit (HOSPITAL_COMMUNITY): Payer: Self-pay | Admitting: Nurse Practitioner

## 2023-08-30 DIAGNOSIS — R413 Other amnesia: Secondary | ICD-10-CM

## 2023-12-13 ENCOUNTER — Encounter (HOSPITAL_COMMUNITY): Payer: Self-pay

## 2023-12-13 ENCOUNTER — Ambulatory Visit (HOSPITAL_COMMUNITY): Admission: RE | Admit: 2023-12-13 | Payer: Medicare (Managed Care) | Source: Ambulatory Visit

## 2023-12-14 ENCOUNTER — Other Ambulatory Visit (HOSPITAL_BASED_OUTPATIENT_CLINIC_OR_DEPARTMENT_OTHER): Payer: Self-pay | Admitting: Nurse Practitioner

## 2023-12-14 DIAGNOSIS — R413 Other amnesia: Secondary | ICD-10-CM

## 2023-12-31 LAB — LAB REPORT - SCANNED: EGFR: 68

## 2024-01-10 ENCOUNTER — Ambulatory Visit (HOSPITAL_COMMUNITY)
Admission: RE | Admit: 2024-01-10 | Discharge: 2024-01-10 | Disposition: A | Discharge status: Home / Self Care | Payer: Medicare Other | Source: Ambulatory Visit | Attending: Pulmonary Disease | Admitting: Pulmonary Disease

## 2024-01-10 ENCOUNTER — Ambulatory Visit (HOSPITAL_COMMUNITY)
Admission: RE | Admit: 2024-01-10 | Discharge: 2024-01-10 | Disposition: A | Discharge status: Home / Self Care | Payer: Medicare Other | Source: Ambulatory Visit | Attending: Nurse Practitioner | Admitting: Nurse Practitioner

## 2024-01-10 ENCOUNTER — Encounter (HOSPITAL_COMMUNITY): Payer: Self-pay

## 2024-01-10 ENCOUNTER — Other Ambulatory Visit (HOSPITAL_BASED_OUTPATIENT_CLINIC_OR_DEPARTMENT_OTHER): Payer: Self-pay | Admitting: Nurse Practitioner

## 2024-01-10 DIAGNOSIS — R413 Other amnesia: Secondary | ICD-10-CM

## 2024-09-10 ENCOUNTER — Other Ambulatory Visit: Payer: Self-pay | Admitting: Nurse Practitioner

## 2024-09-10 DIAGNOSIS — N63 Unspecified lump in unspecified breast: Secondary | ICD-10-CM

## 2024-09-25 ENCOUNTER — Ambulatory Visit
Admission: RE | Admit: 2024-09-25 | Discharge: 2024-09-25 | Disposition: A | Source: Ambulatory Visit | Attending: Nurse Practitioner

## 2024-09-25 ENCOUNTER — Ambulatory Visit

## 2024-09-25 DIAGNOSIS — N63 Unspecified lump in unspecified breast: Secondary | ICD-10-CM
